# Patient Record
Sex: Male | Born: 1965
Health system: Southern US, Community
[De-identification: ages and names within clinical notes are randomized; demographics above are authoritative.]

## PROBLEM LIST (undated history)

## (undated) DIAGNOSIS — I314 Cardiac tamponade: Secondary | ICD-10-CM

## (undated) DIAGNOSIS — I10 Essential (primary) hypertension: Secondary | ICD-10-CM

## (undated) DIAGNOSIS — E785 Hyperlipidemia, unspecified: Secondary | ICD-10-CM

## (undated) DIAGNOSIS — G4730 Sleep apnea, unspecified: Secondary | ICD-10-CM

## (undated) DIAGNOSIS — I2699 Other pulmonary embolism without acute cor pulmonale: Secondary | ICD-10-CM

## (undated) DIAGNOSIS — I2510 Atherosclerotic heart disease of native coronary artery without angina pectoris: Secondary | ICD-10-CM

## (undated) DIAGNOSIS — I25118 Atherosclerotic heart disease of native coronary artery with other forms of angina pectoris: Secondary | ICD-10-CM

## (undated) DIAGNOSIS — Z8673 Personal history of transient ischemic attack (TIA), and cerebral infarction without residual deficits: Secondary | ICD-10-CM

## (undated) HISTORY — DX: Hyperlipidemia, unspecified: E78.5

## (undated) HISTORY — DX: Atherosclerotic heart disease of native coronary artery with other forms of angina pectoris: I25.118

## (undated) HISTORY — DX: Personal history of transient ischemic attack (TIA), and cerebral infarction without residual deficits: Z86.73

## (undated) HISTORY — DX: Essential (primary) hypertension: I10

## (undated) HISTORY — DX: Atherosclerotic heart disease of native coronary artery without angina pectoris: I25.10

## (undated) HISTORY — DX: Sleep apnea, unspecified: G47.30

## (undated) HISTORY — DX: Cardiac tamponade: I31.4

## (undated) HISTORY — PX: TONSILLECTOMY: SUR1361

---

## 2010-03-13 DIAGNOSIS — I2510 Atherosclerotic heart disease of native coronary artery without angina pectoris: Secondary | ICD-10-CM

## 2010-03-13 HISTORY — DX: Atherosclerotic heart disease of native coronary artery without angina pectoris: I25.10

## 2012-11-01 ENCOUNTER — Encounter: Payer: Self-pay | Admitting: Critical Care Medicine

## 2012-11-04 ENCOUNTER — Institutional Professional Consult (permissible substitution): Payer: Self-pay | Admitting: Critical Care Medicine

## 2012-11-07 ENCOUNTER — Institutional Professional Consult (permissible substitution): Payer: Self-pay | Admitting: Critical Care Medicine

## 2012-11-25 ENCOUNTER — Encounter: Payer: Self-pay | Admitting: Critical Care Medicine

## 2012-11-25 ENCOUNTER — Ambulatory Visit (INDEPENDENT_AMBULATORY_CARE_PROVIDER_SITE_OTHER): Payer: Medicaid Other | Admitting: Critical Care Medicine

## 2012-11-25 ENCOUNTER — Ambulatory Visit (HOSPITAL_BASED_OUTPATIENT_CLINIC_OR_DEPARTMENT_OTHER)
Admission: RE | Admit: 2012-11-25 | Discharge: 2012-11-25 | Disposition: A | Discharge status: Home / Self Care | Payer: Medicaid Other | Source: Ambulatory Visit | Attending: Critical Care Medicine | Admitting: Critical Care Medicine

## 2012-11-25 ENCOUNTER — Telehealth (HOSPITAL_COMMUNITY): Payer: Self-pay | Admitting: Respiratory Therapy

## 2012-11-25 VITALS — BP 152/98 | HR 62 | Ht 64.0 in | Wt 168.0 lb

## 2012-11-25 DIAGNOSIS — R918 Other nonspecific abnormal finding of lung field: Secondary | ICD-10-CM

## 2012-11-25 DIAGNOSIS — R0600 Dyspnea, unspecified: Secondary | ICD-10-CM

## 2012-11-25 DIAGNOSIS — I2510 Atherosclerotic heart disease of native coronary artery without angina pectoris: Secondary | ICD-10-CM | POA: Insufficient documentation

## 2012-11-25 DIAGNOSIS — F17200 Nicotine dependence, unspecified, uncomplicated: Secondary | ICD-10-CM

## 2012-11-25 DIAGNOSIS — R911 Solitary pulmonary nodule: Secondary | ICD-10-CM | POA: Insufficient documentation

## 2012-11-25 DIAGNOSIS — R0609 Other forms of dyspnea: Secondary | ICD-10-CM

## 2012-11-25 DIAGNOSIS — Z23 Encounter for immunization: Secondary | ICD-10-CM

## 2012-11-25 DIAGNOSIS — E785 Hyperlipidemia, unspecified: Secondary | ICD-10-CM | POA: Insufficient documentation

## 2012-11-25 DIAGNOSIS — I25118 Atherosclerotic heart disease of native coronary artery with other forms of angina pectoris: Secondary | ICD-10-CM | POA: Insufficient documentation

## 2012-11-25 DIAGNOSIS — Z8673 Personal history of transient ischemic attack (TIA), and cerebral infarction without residual deficits: Secondary | ICD-10-CM | POA: Insufficient documentation

## 2012-11-25 DIAGNOSIS — I10 Essential (primary) hypertension: Secondary | ICD-10-CM | POA: Insufficient documentation

## 2012-11-25 HISTORY — DX: Nicotine dependence, unspecified, uncomplicated: F17.200

## 2012-11-25 HISTORY — DX: Other nonspecific abnormal finding of lung field: R91.8

## 2012-11-25 MED ORDER — VARENICLINE TARTRATE 0.5 MG PO TABS: 0.5000 mg | ORAL_TABLET | Freq: Two times a day (BID) | ORAL | Status: DC

## 2012-11-25 NOTE — Telephone Encounter (Signed)
After further screening of this patient, he is not eligible based on exclusion 4.2- a prior CT scan available that previously identifies the same 8-30 mm lung nodule on the most current CT scan that is under consideration for study inclusion AND the prior CT scan more than 60 days prior to the current CT scan, irrespective of the size, density or appearance

## 2012-11-25 NOTE — Progress Notes (Signed)
Quick Note:  Notify the patient that the CT scan is stable. No changes in nodules. I recommend repeat scan in 12 months Focus on getting off cigarettes No change in medications are recommended. Continue current meds as prescribed at last office visit ______

## 2012-11-25 NOTE — Assessment & Plan Note (Signed)
9/15/2014CT Chest : small nodule RUL LUL. Stable since 03/2012 Smoking cessation advised and Rx chantix Repeat CT Chest in one year  Suspect benign nodules but need close f/u Plan Chantix smoking cessation counseling issued No other medication changes Note normal spirometry

## 2012-11-25 NOTE — Progress Notes (Signed)
Subjective:    Patient ID: Scott Foley, male    DOB: 12/08/1965, 46 y.o.   MRN: [id number]  HPI  47 y.o.M referred for pulm nodule eval. Pt had bronchitis end 09/2012. CT done 03/2012, found nodules then. Regular film done 09/2012.   Pt had cough, mucus was productive gray to brown.  No real wheeze. Noted ssome DOE.  Now is still prod cough but not as severe. When do is brown-gray.  Smoking 1PPD. Patches only used.    Past Medical History  Diagnosis Date  . CAD (coronary artery disease) 2012    [facility] No heart cath, treated medically [facility]  . Hypertension   . Hyperlipemia   . History of stroke      Family History  Problem Relation Age of Onset  . Hypertension Father   . Hypertension Sister   . Hypertension Brother   . Heart attack Father   . Heart attack Brother   . Stroke Father   . Prostate cancer Father   . Lung cancer Sister      History   Social History  . Marital Status: Married    Spouse Name: N/A    Number of Children: N/A  . Years of Education: N/A   Occupational History  . Not on file.   Social History Main Topics  . Smoking status: Current Every Day Smoker -- 1.00 packs/day for 32 years    Types: Cigarettes  . Smokeless tobacco: Not on file  . Alcohol Use: Not on file  . Drug Use: Not on file  . Sexual Activity: Not on file   Other Topics Concern  . Not on file   Social History Narrative  . No narrative on file     Allergies  Allergen Reactions  . Penicillins      Outpatient Prescriptions Prior to Visit  Medication Sig Dispense Refill  . clopidogrel (PLAVIX) 75 MG tablet Take 75 mg by mouth daily.      Marland Kitchen lisinopril (PRINIVIL,ZESTRIL) 40 MG tablet Take 40 mg by mouth daily.      . metoprolol (LOPRESSOR) 50 MG tablet Take 50 mg by mouth 2 (two) times daily.      . Omega-3 Fatty Acids (FISH OIL) 1000 MG CAPS Take by mouth 2 (two) times daily.      . pravastatin (PRAVACHOL) 40 MG tablet Take 40 mg by mouth daily.       No  facility-administered medications prior to visit.     Review of Systems  Constitutional: Negative for fever, chills, diaphoresis, activity change, appetite change, fatigue and unexpected weight change.  HENT: Positive for dental problem. Negative for hearing loss, ear pain, nosebleeds, congestion, sore throat, facial swelling, rhinorrhea, sneezing, mouth sores, trouble swallowing, neck pain, neck stiffness, voice change, postnasal drip, sinus pressure, tinnitus and ear discharge.   Eyes: Negative for photophobia, discharge, itching and visual disturbance.  Respiratory: Positive for cough (with production of gray mucus) and shortness of breath. Negative for apnea, choking, chest tightness, wheezing and stridor.   Cardiovascular: Negative for chest pain, palpitations and leg swelling.  Gastrointestinal: Negative for nausea, vomiting, abdominal pain, constipation, blood in stool and abdominal distention.  Genitourinary: Negative for dysuria, urgency, frequency, hematuria, flank pain, decreased urine volume and difficulty urinating.  Musculoskeletal: Negative for myalgias, back pain, joint swelling, arthralgias and gait problem.  Skin: Negative for color change, pallor and rash.  Neurological: Negative for dizziness, tremors, seizures, syncope, speech difficulty, weakness, light-headedness, numbness and headaches.  Hematological:  Negative for adenopathy. Does not bruise/bleed easily.  Psychiatric/Behavioral: Negative for confusion, sleep disturbance and agitation. The patient is not nervous/anxious.        Objective:   Physical Exam Filed Vitals:   11/25/12 1010  BP: 152/98  Pulse: 62  Height: 5\' 4"  (1.626 m)  Weight: 168 lb (76.204 kg)  SpO2: 96%    Gen: Pleasant, well-nourished, in no distress,  normal affect  ENT: No lesions,  mouth clear,  oropharynx clear, no postnasal drip  Neck: No JVD, no TMG, no carotid bruits  Lungs: No use of accessory muscles, no dullness to percussion,  clear without rales or rhonchi  Cardiovascular: RRR, heart sounds normal, no murmur or gallops, no peripheral edema  Abdomen: soft and NT, no HSM,  BS normal  Musculoskeletal: No deformities, no cyanosis or clubbing  Neuro: alert, non focal  Skin: Warm, no lesions or rashes  Ct Chest Wo Contrast  11/25/2012   CLINICAL DATA:  Follow-up pulmonary nodules  EXAM: CT CHEST WITHOUT CONTRAST  TECHNIQUE: Multidetector CT imaging of the chest was performed following the standard protocol without IV contrast.  COMPARISON:  [facility] CT chest dated 03/30/2012  FINDINGS: Scattered bilateral pulmonary nodules, including:  --4 mm subpleural nodule in the left upper lobe (series 3/image 19)  --8 x 6 mm subpleural nodule along the right major fissure (series 3/image 30)  --Additional tiny fissural nodularity bilaterally (series 3/ images 32 and 34)  --3 mm subpleural nodule in the right middle lobe (series 3/image 36)  No new/suspicious pulmonary nodules. No pleural effusion or pneumothorax.  Visualized thyroid is unremarkable.  Heart is normal in size. No pericardial effusion. Coronary atherosclerosis (series 2/ image 31).  Small mediastinal lymph nodes measuring up to 7 mm short axis, likely reactive. No suspicious axillary lymphadenopathy.  Visualized upper abdomen is notable for vascular calcifications and a calcified splenic granuloma.  Visualized osseous structures are within normal limits.  IMPRESSION: Scattered small bilateral pulmonary nodules, as described above, including a stable 8 x 6 mm subpleural nodule along the right minor fissure.  8 month stability has been demonstrated. Given high risk for primary bronchogenic neoplasm, follow-up CT chest is suggested in approximately 12 months (18-24 months from initial CT). This recommendation follows the consensus statement: Guidelines for Management of Small Pulmonary Nodules Detected on CT Scans: A Statement from the Fleischner Society as published in Radiology  2005; 237:395-400.  Age advanced coronary atherosclerosis.   Electronically Signed   By: Charline Bills M.D.   On: 11/25/2012 14:19          Assessment & Plan:   Pulmonary nodules/lesions, multiple 9/15/2014CT Chest : small nodule RUL LUL. Stable since 03/2012 Smoking cessation advised and Rx chantix Repeat CT Chest in one year  Suspect benign nodules but need close f/u Plan Chantix smoking cessation counseling issued No other medication changes Note normal spirometry  Tobacco use disorder >24min smoking cessation counseling given chantix rx given    Updated Medication List Outpatient Encounter Prescriptions as of 11/25/2012  Medication Sig Dispense Refill  . clopidogrel (PLAVIX) 75 MG tablet Take 75 mg by mouth daily.      Marland Kitchen lisinopril (PRINIVIL,ZESTRIL) 40 MG tablet Take 40 mg by mouth daily.      . metoprolol (LOPRESSOR) 50 MG tablet Take 50 mg by mouth 2 (two) times daily.      . Omega-3 Fatty Acids (FISH OIL) 1000 MG CAPS Take by mouth 2 (two) times daily.      Marland Kitchen  pravastatin (PRAVACHOL) 40 MG tablet Take 40 mg by mouth daily.      . varenicline (CHANTIX) 0.5 MG tablet Take 1 tablet (0.5 mg total) by mouth 2 (two) times daily.  60 tablet  2   No facility-administered encounter medications on file as of 11/25/2012.

## 2012-11-25 NOTE — Telephone Encounter (Signed)
Ok thank you 

## 2012-11-25 NOTE — Patient Instructions (Addendum)
Consider flu vaccine and pneumovax today Stop smoking, I recommend Chantix A CT chest will be obtained, I will call results Return 3 months

## 2012-11-25 NOTE — Assessment & Plan Note (Signed)
>  smoking cessation counseling given chantix rx given

## 2012-11-26 NOTE — Progress Notes (Signed)
Quick Note:  Spoke with pt. Informed him of ct chest results and recs per Dr. Delford Field. He verbalized understanding and voiced no further questions or concerns at this time. ______

## 2012-11-26 NOTE — Progress Notes (Signed)
Quick Note:  lmomtcb for pt ______ 

## 2012-12-02 ENCOUNTER — Telehealth: Payer: Self-pay | Admitting: Critical Care Medicine

## 2012-12-02 NOTE — Telephone Encounter (Signed)
Spoke with the pt and notified that he does not qualify for the study per PW He verbalized understanding and states nothing further needed

## 2012-12-02 NOTE — Telephone Encounter (Signed)
Yes, he does not qualify for the study

## 2012-12-02 NOTE — Telephone Encounter (Signed)
I spoke with pt. He stated he was advised that his CT was going to be showed to another physician by Dr. Delford Field and see if he qualifies for a study. I do not see this in EPIC. Please advise Dr. Delford Field thanks

## 2013-06-17 ENCOUNTER — Telehealth: Payer: Self-pay | Admitting: Critical Care Medicine

## 2013-06-17 NOTE — Telephone Encounter (Signed)
Pt saw PW in [place] 11/25/12.  Pt lives in [place] & would like to be seen in [place] office.  Set for ROV on 4/14 @ 12.  Pt given address, verbalized understanding & states nothing further needed at this time.

## 2013-06-24 ENCOUNTER — Encounter: Payer: Self-pay | Admitting: Critical Care Medicine

## 2013-06-24 ENCOUNTER — Ambulatory Visit (INDEPENDENT_AMBULATORY_CARE_PROVIDER_SITE_OTHER): Payer: Self-pay | Admitting: Critical Care Medicine

## 2013-06-24 VITALS — BP 156/78 | HR 82 | Temp 97.9°F | Ht 63.0 in | Wt 176.6 lb

## 2013-06-24 DIAGNOSIS — R918 Other nonspecific abnormal finding of lung field: Secondary | ICD-10-CM

## 2013-06-24 DIAGNOSIS — F17200 Nicotine dependence, unspecified, uncomplicated: Secondary | ICD-10-CM

## 2013-06-24 MED ORDER — NICOTINE 10 MG IN INHA: RESPIRATORY_TRACT | Status: DC

## 2013-06-24 NOTE — Patient Instructions (Signed)
Start nicotrol 60-80 puff per cartridges 6 cartridges per day A CT Chest will be obtained Return 3 months

## 2013-06-24 NOTE — Assessment & Plan Note (Addendum)
Bilateral pulmonary nodules Ongoing tobacco use Plan  Repeat CT Chest

## 2013-06-24 NOTE — Progress Notes (Signed)
Subjective:    Patient ID: Scott Foley, male    DOB: Sep 23, 1965, 48 y.o.   MRN: [id number]  HPI   48 y.o.M referred for pulm nodule eval. Pt had bronchitis end 09/2012. CT done 03/2012, found nodules then. Regular film done 09/2012.   Pt had cough, mucus was productive gray to brown.  No real wheeze. Noted ssome DOE.  Now is still prod cough but not as severe. When do is brown-gray.  Smoking 1PPD. Patches only used.    06/24/2013 Chief Complaint  Patient presents with  . 6 month follow up    Breathing has worsened over the past month.  Has increased DOE and chest pressure with activity.  Not coughing much - prod at times with gray mucus.  No f/c/s.  Was in [place] ED on April 5 - cxr done.    Not seen since 11/2012.  Smoker. Pt just in ED dyspnea, cough, gray mucus. Notes no wheezing.  Chest tight if move around. No real nasal drainage.  ?early PNA.  Pt smokes 10cigs/day.    Past Medical History  Diagnosis Date  . CAD (coronary artery disease) 2012    [facility] No heart cath, treated medically [facility]  . Hypertension   . Hyperlipemia   . History of stroke      Family History  Problem Relation Age of Onset  . Hypertension Father   . Hypertension Sister   . Hypertension Brother   . Heart attack Father   . Heart attack Brother   . Stroke Father   . Prostate cancer Father   . Lung cancer Sister      History   Social History  . Marital Status: Married    Spouse Name: N/A    Number of Children: N/A  . Years of Education: N/A   Occupational History  . Not on file.   Social History Main Topics  . Smoking status: Current Every Day Smoker -- 0.50 packs/day    Types: Cigarettes    Start date: 03/14/1979  . Smokeless tobacco: Never Used  . Alcohol Use: No  . Drug Use: No  . Sexual Activity: Not on file   Other Topics Concern  . Not on file   Social History Narrative  . No narrative on file     Allergies  Allergen Reactions  . Penicillins      Outpatient  Prescriptions Prior to Visit  Medication Sig Dispense Refill  . clopidogrel (PLAVIX) 75 MG tablet Take 75 mg by mouth daily.      Marland Kitchen lisinopril (PRINIVIL,ZESTRIL) 40 MG tablet Take 40 mg by mouth daily.      . metoprolol (LOPRESSOR) 50 MG tablet Take 50 mg by mouth 2 (two) times daily.      . Omega-3 Fatty Acids (FISH OIL) 1000 MG CAPS Take by mouth 2 (two) times daily.      . pravastatin (PRAVACHOL) 40 MG tablet Take 40 mg by mouth daily.      . varenicline (CHANTIX) 0.5 MG tablet Take 1 tablet (0.5 mg total) by mouth 2 (two) times daily.  60 tablet  2   No facility-administered medications prior to visit.     Review of Systems  Constitutional: Negative for fever, chills, diaphoresis, activity change, appetite change, fatigue and unexpected weight change.  HENT: Positive for dental problem. Negative for congestion, ear discharge, ear pain, facial swelling, hearing loss, mouth sores, nosebleeds, postnasal drip, rhinorrhea, sinus pressure, sneezing, sore throat, tinnitus, trouble swallowing and voice  change.   Eyes: Negative for photophobia, discharge, itching and visual disturbance.  Respiratory: Positive for cough (with production of gray mucus) and shortness of breath. Negative for apnea, choking, chest tightness, wheezing and stridor.   Cardiovascular: Negative for chest pain, palpitations and leg swelling.  Gastrointestinal: Negative for nausea, vomiting, abdominal pain, constipation, blood in stool and abdominal distention.  Genitourinary: Negative for dysuria, urgency, frequency, hematuria, flank pain, decreased urine volume and difficulty urinating.  Musculoskeletal: Negative for arthralgias, back pain, gait problem, joint swelling, myalgias, neck pain and neck stiffness.  Skin: Negative for color change, pallor and rash.  Neurological: Negative for dizziness, tremors, seizures, syncope, speech difficulty, weakness, light-headedness, numbness and headaches.  Hematological: Negative for  adenopathy. Does not bruise/bleed easily.  Psychiatric/Behavioral: Negative for confusion, sleep disturbance and agitation. The patient is not nervous/anxious.        Objective:   Physical Exam  Filed Vitals:   06/24/13 1150  BP: 156/78  Pulse: 82  Temp: 97.9 F (36.6 C)  TempSrc: Oral  Height: 5\' 3"  (1.6 m)  Weight: 176 lb 9.6 oz (80.105 kg)  SpO2: 99%    Gen: Pleasant, well-nourished, in no distress,  normal affect  ENT: No lesions,  mouth clear,  oropharynx clear, no postnasal drip  Neck: No JVD, no TMG, no carotid bruits  Lungs: No use of accessory muscles, no dullness to percussion, clear without rales or rhonchi  Cardiovascular: RRR, heart sounds normal, no murmur or gallops, no peripheral edema  Abdomen: soft and NT, no HSM,  BS normal  Musculoskeletal: No deformities, no cyanosis or clubbing  Neuro: alert, non focal  Skin: Warm, no lesions or rashes  No results found.        Assessment & Plan:   Pulmonary nodules/lesions, multiple Bilateral pulmonary nodules Ongoing tobacco use Plan  Repeat CT Chest   Tobacco use disorder Active smoking use.   Smoking cessation counseling issued   Mild bronchitis now resolving No further ABX   Updated Medication List Outpatient Encounter Prescriptions as of 06/24/2013  Medication Sig  . clopidogrel (PLAVIX) 75 MG tablet Take 75 mg by mouth daily.  Marland Kitchen lisinopril (PRINIVIL,ZESTRIL) 40 MG tablet Take 40 mg by mouth daily.  . metoprolol (LOPRESSOR) 50 MG tablet Take 50 mg by mouth 2 (two) times daily.  . Omega-3 Fatty Acids (FISH OIL) 1000 MG CAPS Take by mouth 2 (two) times daily.  . pravastatin (PRAVACHOL) 40 MG tablet Take 40 mg by mouth daily.  . nicotine (NICOTROL) 10 MG inhaler Take 60- 80 puff per cartridge 6 cartridges per day  . [DISCONTINUED] varenicline (CHANTIX) 0.5 MG tablet Take 1 tablet (0.5 mg total) by mouth 2 (two) times daily.

## 2013-06-26 NOTE — Assessment & Plan Note (Signed)
Active smoking use.   Smoking cessation counseling issued

## 2013-06-27 ENCOUNTER — Telehealth: Payer: Self-pay | Admitting: Critical Care Medicine

## 2013-06-27 NOTE — Telephone Encounter (Signed)
Spoke to lisa she is to call the [place] office for the pcc there to have done this precert for is pt Scott Foley

## 2013-06-27 NOTE — Telephone Encounter (Signed)
Spoke with Lattie Haw at [facility].  They need precert for pt. Ct is scheduled for 06/30/13. - Insurance is [employer] - CPT code : [phone]

## 2013-07-01 ENCOUNTER — Telehealth: Payer: Self-pay | Admitting: Critical Care Medicine

## 2013-07-01 DIAGNOSIS — R918 Other nonspecific abnormal finding of lung field: Secondary | ICD-10-CM

## 2013-07-01 NOTE — Telephone Encounter (Signed)
Pt aware of results of ct  Repeat one year

## 2013-07-10 ENCOUNTER — Ambulatory Visit: Payer: Medicaid Other | Admitting: Critical Care Medicine

## 2013-08-07 ENCOUNTER — Encounter: Payer: Self-pay | Admitting: Critical Care Medicine

## 2013-11-19 ENCOUNTER — Telehealth: Payer: Self-pay | Admitting: *Deleted

## 2013-11-19 NOTE — Telephone Encounter (Signed)
lmomtcb on pt's home and cell #s - would he like to have CT done at [facility]?

## 2013-11-19 NOTE — Telephone Encounter (Signed)
Message copied by Adalberto Ill on Wed Nov 19, 2013 12:08 PM ------      Message from: Elsie Stain      Created: Tue Nov 18, 2013 12:57 PM      Regarding: ct chest       Time to schedule a CT chest NON contrast      F/u on lung nodules            pw      ----- Message -----         From: Elsie Stain, MD         Sent: 11/25/2012   5:41 PM           To: Elsie Stain, MD            chk CT chest nodules        ------

## 2013-11-20 NOTE — Telephone Encounter (Signed)
lmomtcb  

## 2013-11-24 NOTE — Telephone Encounter (Signed)
lmomtcb for pt 

## 2013-11-27 NOTE — Telephone Encounter (Signed)
Upon review of pt's chart,  Pt has had a repeat CT Chest on 07/01/13 at [facility] - results scanned into chart. Msg from 07/01/13 with the following recs regarding CT Chest f/u:  Elsie Stain, MD at 07/01/2013 10:51 AM     Status: Signed        Pt aware of results of ct  Repeat one year   ---------  Spoke with Dr. Joya Gaskins.  Pt does not need a repeat CT Chest at this time.  He will be due for a repeat CT Chest in April 2016.  Reminder placed for April 2016 f/u CT Chest.

## 2014-07-01 ENCOUNTER — Telehealth: Payer: Self-pay | Admitting: Critical Care Medicine

## 2014-07-01 NOTE — Telephone Encounter (Signed)
Pt needs yearly f/u CT Chest non contrast to f/u on lung nodules.  lmomtcb for pt on home #. ATC pt's cell # - rang several times then received msg that VM box is not set up.  WCB. Would pt like to have scan done in [facility]?

## 2014-07-01 NOTE — Telephone Encounter (Signed)
Yes Cantril is ok

## 2014-07-03 NOTE — Telephone Encounter (Signed)
Called pt at [phone] (# listed as pt's home #), lmomtcb  ATC pt at [phone] (# listed as pt's cell #), was advised gentleman has only had this # for a short period of time and is not the correct contact # for this pt.

## 2014-07-06 NOTE — Telephone Encounter (Signed)
Spoke with pt and he states he needs to wait on having CT done until he gets his insurance straightened out.  He states he will call out office when he is ready to schedule.

## 2014-07-06 NOTE — Telephone Encounter (Signed)
Pt returning call.Scott Foley ° °

## 2014-07-06 NOTE — Telephone Encounter (Signed)
Noted, crystal put on call back list to chk on this pt status in two months

## 2014-07-06 NOTE — Telephone Encounter (Signed)
ATC pt at [phone], rang several times then received msg that memory is full.  Unable to leave msg.   ATC emergency contact, pt's brother Shanon Brow at [phone], lmomtcb Will attempt to call [place] office to see if they have a alternative contact # for pt.

## 2014-07-07 NOTE — Telephone Encounter (Signed)
Reminder placed to f/u with pt in 2 months re: CT Chest.

## 2014-09-02 ENCOUNTER — Telehealth: Payer: Self-pay | Admitting: *Deleted

## 2014-09-02 DIAGNOSIS — R918 Other nonspecific abnormal finding of lung field: Secondary | ICD-10-CM

## 2014-09-02 NOTE — Telephone Encounter (Signed)
Order has been placed.

## 2014-09-02 NOTE — Telephone Encounter (Signed)
-----   Message from Elsie Stain, MD sent at 09/01/2014  9:30 AM EDT ----- Regarding: FW: needs ct chst  no contrast lung nodule Needs repeat ct chest done  ----- Message -----    From: Adalberto Ill, RN    Sent: 07/07/2014   8:18 AM      To: Elsie Stain, MD Subject: FW: needs ct chst  no contrast lung nodule     F/u with pt to schedule CT - see April 2016 phone msg ( pt needed to wait to schedule scan)   ----- Message -----    From: Elsie Stain, MD    Sent: 06/29/2014   7:50 PM      To: Adalberto Ill, RN Subject: needs ct chst  no contrast lung nodule         fyi ----- Message -----    From: Adalberto Ill, RN    Sent: 11/27/2013   2:49 PM      To: Elsie Stain, MD Subject: RE: ct chest                                   Pt had a repeat CT Chest April 2015 with recs to repeat in 1 yr.   Will need f/u CT Chest NON contrast April 2016 F/u on lung nodules   ----- Message -----    From: Elsie Stain, MD    Sent: 11/18/2013  12:57 PM      To: Jonelle Sports, RN Subject: ct chest                                       Time to schedule a CT chest NON contrast F/u on lung nodules  pw ----- Message -----    From: Elsie Stain, MD    Sent: 11/25/2012   5:41 PM      To: Elsie Stain, MD  chk CT chest nodules

## 2014-10-15 ENCOUNTER — Telehealth: Payer: Self-pay | Admitting: Critical Care Medicine

## 2014-10-15 ENCOUNTER — Encounter: Payer: Self-pay | Admitting: *Deleted

## 2014-10-15 NOTE — Telephone Encounter (Signed)
Pt is due for CT Chest to f/u pulmonary nodules.  PCCs have been trying to contact pt to schedule this for several wks.  However, the # listed as pt's home #, [phone], is no longer in service.  [place], [facility] attempted to contact pt's emergency contact who is listed as his brother, Shanon Brow, at [phone] a few days ago.  [place] states she spoke with Shanon Brow who stated he would let pt know our office was trying to contact him.  We have not received call back from pt.  I called and spoke with pt's brother, Shanon Brow, at his # listed above and advised we are trying to contact pt but # we have is no longer in service.  Shanon Brow states he will let pt know tonight that we are trying to contact him and ask for him to call us back.  I provided Shanon Brow with our #.  Asked Shanon Brow if he was able to provide a correct contact # for pt - stated he did not know the #.  Asked if he knew pt's correct mailing address, but he did not know this either.  As we have left msg for pt x 2 with his emergency contact and do not have a working # for pt, will send certified letter to pt's address on file.    Letter completed.  Will take to Mail Room to send certified tomorrow as she has already left today.

## 2014-10-16 NOTE — Telephone Encounter (Signed)
Thank you for your due diligence in this matter  Also contact the pt PCP to make him aware

## 2014-10-16 NOTE — Telephone Encounter (Signed)
Called Dr. Marjie Skiff with [facility]. Advised her of below and she will will Dr. Megan Salon know.

## 2014-10-16 NOTE — Telephone Encounter (Signed)
Letter given to Terri in Mail Room to send certified to pt.  Will sign off and route msg to PW as FYI.

## 2014-10-23 ENCOUNTER — Telehealth: Payer: Self-pay | Admitting: Critical Care Medicine

## 2014-10-23 NOTE — Telephone Encounter (Signed)
Will send to [facility] as she was trying to get in touch with this patient regarding a referral .  Referral Notes     Type Date User   General 10/12/2014 12:48 PM Brien Few E        Note   Reached this pt's brother Shanon Brow) who will have pt call our office to scheduled this appt Joellen Jersey

## 2014-10-23 NOTE — Telephone Encounter (Signed)
Pt scheduled@[facility] 10[facility]30/14@10 :00am for his chest ct and he is aware Joellen Jersey

## 2014-11-02 ENCOUNTER — Telehealth: Payer: Self-pay | Admitting: Critical Care Medicine

## 2014-11-02 DIAGNOSIS — R918 Other nonspecific abnormal finding of lung field: Secondary | ICD-10-CM

## 2014-11-02 NOTE — Telephone Encounter (Signed)
Pt had CT scan done at [facility] 10/30/14 and is requesting results. Please advise Dr. Joya Gaskins thanks

## 2014-11-02 NOTE — Telephone Encounter (Signed)
i spoke to the pt and he is aware of results

## 2014-11-02 NOTE — Telephone Encounter (Signed)
I called pt and Ct chest is stable.  No new issues.   No new scans needed  Pt advised to pursue smoking cessation

## 2014-11-02 NOTE — Telephone Encounter (Signed)
Results given to Dr. Joya Gaskins to review.

## 2014-11-02 NOTE — Telephone Encounter (Signed)
Let patient know results ,  Let me know results

## 2015-01-11 ENCOUNTER — Encounter: Payer: Self-pay | Admitting: Critical Care Medicine

## 2016-05-11 DIAGNOSIS — I2510 Atherosclerotic heart disease of native coronary artery without angina pectoris: Secondary | ICD-10-CM | POA: Diagnosis not present

## 2016-05-11 DIAGNOSIS — I252 Old myocardial infarction: Secondary | ICD-10-CM | POA: Diagnosis not present

## 2016-05-11 DIAGNOSIS — R51 Headache: Secondary | ICD-10-CM | POA: Diagnosis not present

## 2016-05-11 DIAGNOSIS — I160 Hypertensive urgency: Secondary | ICD-10-CM | POA: Diagnosis not present

## 2016-05-15 DIAGNOSIS — Z8673 Personal history of transient ischemic attack (TIA), and cerebral infarction without residual deficits: Secondary | ICD-10-CM | POA: Diagnosis not present

## 2016-05-15 DIAGNOSIS — I160 Hypertensive urgency: Secondary | ICD-10-CM | POA: Diagnosis not present

## 2016-05-15 DIAGNOSIS — I2699 Other pulmonary embolism without acute cor pulmonale: Secondary | ICD-10-CM | POA: Diagnosis not present

## 2016-05-15 DIAGNOSIS — R0602 Shortness of breath: Secondary | ICD-10-CM | POA: Diagnosis not present

## 2016-05-15 DIAGNOSIS — Z720 Tobacco use: Secondary | ICD-10-CM | POA: Diagnosis not present

## 2016-05-15 DIAGNOSIS — R69 Illness, unspecified: Secondary | ICD-10-CM | POA: Diagnosis not present

## 2016-05-15 DIAGNOSIS — J449 Chronic obstructive pulmonary disease, unspecified: Secondary | ICD-10-CM | POA: Diagnosis not present

## 2016-05-15 DIAGNOSIS — Z79899 Other long term (current) drug therapy: Secondary | ICD-10-CM | POA: Diagnosis not present

## 2016-05-15 DIAGNOSIS — F17210 Nicotine dependence, cigarettes, uncomplicated: Secondary | ICD-10-CM | POA: Diagnosis not present

## 2016-05-15 DIAGNOSIS — Z596 Low income: Secondary | ICD-10-CM | POA: Diagnosis not present

## 2016-05-15 DIAGNOSIS — I10 Essential (primary) hypertension: Secondary | ICD-10-CM | POA: Diagnosis not present

## 2016-05-15 DIAGNOSIS — I2510 Atherosclerotic heart disease of native coronary artery without angina pectoris: Secondary | ICD-10-CM | POA: Diagnosis not present

## 2016-05-15 DIAGNOSIS — I252 Old myocardial infarction: Secondary | ICD-10-CM | POA: Diagnosis not present

## 2016-05-15 DIAGNOSIS — R079 Chest pain, unspecified: Secondary | ICD-10-CM | POA: Diagnosis not present

## 2016-05-16 DIAGNOSIS — I10 Essential (primary) hypertension: Secondary | ICD-10-CM | POA: Diagnosis not present

## 2016-05-16 DIAGNOSIS — Z8673 Personal history of transient ischemic attack (TIA), and cerebral infarction without residual deficits: Secondary | ICD-10-CM | POA: Diagnosis not present

## 2016-05-16 DIAGNOSIS — Z720 Tobacco use: Secondary | ICD-10-CM | POA: Diagnosis not present

## 2016-05-16 DIAGNOSIS — I2699 Other pulmonary embolism without acute cor pulmonale: Secondary | ICD-10-CM | POA: Diagnosis not present

## 2016-05-18 DIAGNOSIS — I2699 Other pulmonary embolism without acute cor pulmonale: Secondary | ICD-10-CM | POA: Diagnosis not present

## 2016-05-22 DIAGNOSIS — Z7901 Long term (current) use of anticoagulants: Secondary | ICD-10-CM | POA: Diagnosis not present

## 2016-05-24 DIAGNOSIS — I2699 Other pulmonary embolism without acute cor pulmonale: Secondary | ICD-10-CM | POA: Diagnosis not present

## 2016-05-24 DIAGNOSIS — I10 Essential (primary) hypertension: Secondary | ICD-10-CM | POA: Diagnosis not present

## 2016-05-24 DIAGNOSIS — R69 Illness, unspecified: Secondary | ICD-10-CM | POA: Diagnosis not present

## 2016-06-21 DIAGNOSIS — I10 Essential (primary) hypertension: Secondary | ICD-10-CM | POA: Diagnosis not present

## 2016-06-21 DIAGNOSIS — Z7901 Long term (current) use of anticoagulants: Secondary | ICD-10-CM | POA: Diagnosis not present

## 2016-06-22 DIAGNOSIS — I2699 Other pulmonary embolism without acute cor pulmonale: Secondary | ICD-10-CM | POA: Diagnosis not present

## 2016-09-08 DIAGNOSIS — S30861A Insect bite (nonvenomous) of abdominal wall, initial encounter: Secondary | ICD-10-CM | POA: Diagnosis not present

## 2016-09-08 DIAGNOSIS — W57XXXA Bitten or stung by nonvenomous insect and other nonvenomous arthropods, initial encounter: Secondary | ICD-10-CM | POA: Diagnosis not present

## 2016-09-08 DIAGNOSIS — R5383 Other fatigue: Secondary | ICD-10-CM | POA: Diagnosis not present

## 2016-09-09 DIAGNOSIS — W57XXXA Bitten or stung by nonvenomous insect and other nonvenomous arthropods, initial encounter: Secondary | ICD-10-CM | POA: Diagnosis not present

## 2016-09-09 DIAGNOSIS — R5383 Other fatigue: Secondary | ICD-10-CM | POA: Diagnosis not present

## 2016-09-09 DIAGNOSIS — S30861A Insect bite (nonvenomous) of abdominal wall, initial encounter: Secondary | ICD-10-CM | POA: Diagnosis not present

## 2016-09-19 DIAGNOSIS — I10 Essential (primary) hypertension: Secondary | ICD-10-CM | POA: Diagnosis not present

## 2016-09-19 DIAGNOSIS — A770 Spotted fever due to Rickettsia rickettsii: Secondary | ICD-10-CM | POA: Diagnosis not present

## 2016-09-19 DIAGNOSIS — Z7901 Long term (current) use of anticoagulants: Secondary | ICD-10-CM | POA: Diagnosis not present

## 2016-09-19 DIAGNOSIS — E782 Mixed hyperlipidemia: Secondary | ICD-10-CM | POA: Diagnosis not present

## 2016-09-28 DIAGNOSIS — Z7901 Long term (current) use of anticoagulants: Secondary | ICD-10-CM | POA: Diagnosis not present

## 2016-12-14 DIAGNOSIS — Z0000 Encounter for general adult medical examination without abnormal findings: Secondary | ICD-10-CM | POA: Diagnosis not present

## 2016-12-14 DIAGNOSIS — N400 Enlarged prostate without lower urinary tract symptoms: Secondary | ICD-10-CM | POA: Diagnosis not present

## 2016-12-14 DIAGNOSIS — Z6828 Body mass index (BMI) 28.0-28.9, adult: Secondary | ICD-10-CM | POA: Diagnosis not present

## 2016-12-14 DIAGNOSIS — Z23 Encounter for immunization: Secondary | ICD-10-CM | POA: Diagnosis not present

## 2017-02-13 DIAGNOSIS — I2510 Atherosclerotic heart disease of native coronary artery without angina pectoris: Secondary | ICD-10-CM | POA: Diagnosis not present

## 2017-02-13 DIAGNOSIS — Z7901 Long term (current) use of anticoagulants: Secondary | ICD-10-CM | POA: Diagnosis not present

## 2017-02-13 DIAGNOSIS — I2699 Other pulmonary embolism without acute cor pulmonale: Secondary | ICD-10-CM | POA: Diagnosis not present

## 2017-02-13 DIAGNOSIS — I10 Essential (primary) hypertension: Secondary | ICD-10-CM | POA: Diagnosis not present

## 2017-02-13 DIAGNOSIS — E782 Mixed hyperlipidemia: Secondary | ICD-10-CM | POA: Diagnosis not present

## 2017-02-21 DIAGNOSIS — Z7901 Long term (current) use of anticoagulants: Secondary | ICD-10-CM | POA: Diagnosis not present

## 2017-02-27 DIAGNOSIS — H5203 Hypermetropia, bilateral: Secondary | ICD-10-CM | POA: Diagnosis not present

## 2017-03-09 DIAGNOSIS — H524 Presbyopia: Secondary | ICD-10-CM | POA: Diagnosis not present

## 2017-03-09 DIAGNOSIS — H5203 Hypermetropia, bilateral: Secondary | ICD-10-CM | POA: Diagnosis not present

## 2017-03-09 DIAGNOSIS — H52209 Unspecified astigmatism, unspecified eye: Secondary | ICD-10-CM | POA: Diagnosis not present

## 2017-03-20 DIAGNOSIS — I10 Essential (primary) hypertension: Secondary | ICD-10-CM | POA: Diagnosis not present

## 2017-03-20 DIAGNOSIS — Z7901 Long term (current) use of anticoagulants: Secondary | ICD-10-CM | POA: Diagnosis not present

## 2017-04-17 DIAGNOSIS — I10 Essential (primary) hypertension: Secondary | ICD-10-CM | POA: Diagnosis not present

## 2017-06-07 DIAGNOSIS — E782 Mixed hyperlipidemia: Secondary | ICD-10-CM | POA: Diagnosis not present

## 2017-06-07 DIAGNOSIS — I2699 Other pulmonary embolism without acute cor pulmonale: Secondary | ICD-10-CM | POA: Diagnosis not present

## 2017-06-07 DIAGNOSIS — I10 Essential (primary) hypertension: Secondary | ICD-10-CM | POA: Diagnosis not present

## 2017-06-07 DIAGNOSIS — Z122 Encounter for screening for malignant neoplasm of respiratory organs: Secondary | ICD-10-CM | POA: Diagnosis not present

## 2017-06-12 DIAGNOSIS — X58XXXA Exposure to other specified factors, initial encounter: Secondary | ICD-10-CM | POA: Diagnosis not present

## 2017-06-12 DIAGNOSIS — S93402A Sprain of unspecified ligament of left ankle, initial encounter: Secondary | ICD-10-CM | POA: Diagnosis not present

## 2017-06-12 DIAGNOSIS — S86912A Strain of unspecified muscle(s) and tendon(s) at lower leg level, left leg, initial encounter: Secondary | ICD-10-CM | POA: Diagnosis not present

## 2017-06-12 DIAGNOSIS — M79605 Pain in left leg: Secondary | ICD-10-CM | POA: Diagnosis not present

## 2017-06-28 DIAGNOSIS — Z87891 Personal history of nicotine dependence: Secondary | ICD-10-CM | POA: Diagnosis not present

## 2017-06-28 DIAGNOSIS — I2510 Atherosclerotic heart disease of native coronary artery without angina pectoris: Secondary | ICD-10-CM | POA: Diagnosis not present

## 2017-06-28 DIAGNOSIS — Z122 Encounter for screening for malignant neoplasm of respiratory organs: Secondary | ICD-10-CM | POA: Diagnosis not present

## 2017-06-28 DIAGNOSIS — I700 Atherosclerosis of aorta: Secondary | ICD-10-CM | POA: Diagnosis not present

## 2017-06-28 DIAGNOSIS — J439 Emphysema, unspecified: Secondary | ICD-10-CM | POA: Diagnosis not present

## 2017-06-28 DIAGNOSIS — R69 Illness, unspecified: Secondary | ICD-10-CM | POA: Diagnosis not present

## 2017-09-11 DIAGNOSIS — I2699 Other pulmonary embolism without acute cor pulmonale: Secondary | ICD-10-CM | POA: Diagnosis not present

## 2017-09-11 DIAGNOSIS — I10 Essential (primary) hypertension: Secondary | ICD-10-CM | POA: Diagnosis not present

## 2017-09-11 DIAGNOSIS — E782 Mixed hyperlipidemia: Secondary | ICD-10-CM | POA: Diagnosis not present

## 2017-09-11 DIAGNOSIS — I69314 Frontal lobe and executive function deficit following cerebral infarction: Secondary | ICD-10-CM | POA: Diagnosis not present

## 2017-09-12 DIAGNOSIS — I10 Essential (primary) hypertension: Secondary | ICD-10-CM | POA: Diagnosis not present

## 2017-11-07 DIAGNOSIS — I2510 Atherosclerotic heart disease of native coronary artery without angina pectoris: Secondary | ICD-10-CM | POA: Diagnosis not present

## 2017-11-07 DIAGNOSIS — J449 Chronic obstructive pulmonary disease, unspecified: Secondary | ICD-10-CM | POA: Diagnosis not present

## 2017-11-07 DIAGNOSIS — R42 Dizziness and giddiness: Secondary | ICD-10-CM | POA: Diagnosis not present

## 2017-11-07 DIAGNOSIS — I160 Hypertensive urgency: Secondary | ICD-10-CM | POA: Diagnosis not present

## 2017-11-07 DIAGNOSIS — J439 Emphysema, unspecified: Secondary | ICD-10-CM | POA: Diagnosis not present

## 2017-11-07 DIAGNOSIS — I252 Old myocardial infarction: Secondary | ICD-10-CM | POA: Diagnosis not present

## 2017-11-07 DIAGNOSIS — Z8673 Personal history of transient ischemic attack (TIA), and cerebral infarction without residual deficits: Secondary | ICD-10-CM | POA: Diagnosis not present

## 2017-11-07 DIAGNOSIS — I10 Essential (primary) hypertension: Secondary | ICD-10-CM | POA: Diagnosis not present

## 2017-11-13 DIAGNOSIS — Z6829 Body mass index (BMI) 29.0-29.9, adult: Secondary | ICD-10-CM | POA: Diagnosis not present

## 2017-11-13 DIAGNOSIS — I160 Hypertensive urgency: Secondary | ICD-10-CM | POA: Diagnosis not present

## 2017-12-14 DIAGNOSIS — R69 Illness, unspecified: Secondary | ICD-10-CM | POA: Diagnosis not present

## 2017-12-14 DIAGNOSIS — Z125 Encounter for screening for malignant neoplasm of prostate: Secondary | ICD-10-CM | POA: Diagnosis not present

## 2017-12-14 DIAGNOSIS — Z23 Encounter for immunization: Secondary | ICD-10-CM | POA: Diagnosis not present

## 2017-12-14 DIAGNOSIS — E782 Mixed hyperlipidemia: Secondary | ICD-10-CM | POA: Diagnosis not present

## 2017-12-14 DIAGNOSIS — I69314 Frontal lobe and executive function deficit following cerebral infarction: Secondary | ICD-10-CM | POA: Diagnosis not present

## 2017-12-14 DIAGNOSIS — I10 Essential (primary) hypertension: Secondary | ICD-10-CM | POA: Diagnosis not present

## 2017-12-17 DIAGNOSIS — Z0000 Encounter for general adult medical examination without abnormal findings: Secondary | ICD-10-CM | POA: Diagnosis not present

## 2017-12-17 DIAGNOSIS — E663 Overweight: Secondary | ICD-10-CM | POA: Diagnosis not present

## 2017-12-17 DIAGNOSIS — Z1211 Encounter for screening for malignant neoplasm of colon: Secondary | ICD-10-CM | POA: Diagnosis not present

## 2017-12-17 DIAGNOSIS — Z6829 Body mass index (BMI) 29.0-29.9, adult: Secondary | ICD-10-CM | POA: Diagnosis not present

## 2018-07-16 DIAGNOSIS — I10 Essential (primary) hypertension: Secondary | ICD-10-CM | POA: Insufficient documentation

## 2018-07-16 DIAGNOSIS — Z8673 Personal history of transient ischemic attack (TIA), and cerebral infarction without residual deficits: Secondary | ICD-10-CM

## 2018-07-16 DIAGNOSIS — Z720 Tobacco use: Secondary | ICD-10-CM | POA: Insufficient documentation

## 2018-07-16 DIAGNOSIS — J449 Chronic obstructive pulmonary disease, unspecified: Secondary | ICD-10-CM

## 2018-07-16 HISTORY — DX: Personal history of transient ischemic attack (TIA), and cerebral infarction without residual deficits: Z86.73

## 2018-07-16 HISTORY — DX: Chronic obstructive pulmonary disease, unspecified: J44.9

## 2018-07-22 DIAGNOSIS — E782 Mixed hyperlipidemia: Secondary | ICD-10-CM | POA: Insufficient documentation

## 2019-03-03 ENCOUNTER — Emergency Department: Payer: Medicare HMO

## 2019-03-03 ENCOUNTER — Inpatient Hospital Stay (HOSPITAL_COMMUNITY)
Admit: 2019-03-03 | Discharge: 2019-03-03 | Disposition: A | Discharge status: Home / Self Care | Payer: Medicare HMO | Attending: Family Medicine | Admitting: Family Medicine

## 2019-03-03 ENCOUNTER — Inpatient Hospital Stay: Payer: Medicare HMO

## 2019-03-03 ENCOUNTER — Other Ambulatory Visit: Payer: Self-pay

## 2019-03-03 ENCOUNTER — Inpatient Hospital Stay
Admission: EM | Admit: 2019-03-03 | Discharge: 2019-03-07 | DRG: 175 | Disposition: A | Discharge status: Home / Self Care | Payer: Medicare HMO | Attending: Internal Medicine | Admitting: Internal Medicine

## 2019-03-03 DIAGNOSIS — Z8042 Family history of malignant neoplasm of prostate: Secondary | ICD-10-CM

## 2019-03-03 DIAGNOSIS — I2692 Saddle embolus of pulmonary artery without acute cor pulmonale: Secondary | ICD-10-CM

## 2019-03-03 DIAGNOSIS — I2510 Atherosclerotic heart disease of native coronary artery without angina pectoris: Secondary | ICD-10-CM | POA: Diagnosis present

## 2019-03-03 DIAGNOSIS — I10 Essential (primary) hypertension: Secondary | ICD-10-CM | POA: Diagnosis present

## 2019-03-03 DIAGNOSIS — Z20828 Contact with and (suspected) exposure to other viral communicable diseases: Secondary | ICD-10-CM | POA: Diagnosis present

## 2019-03-03 DIAGNOSIS — I2694 Multiple subsegmental pulmonary emboli without acute cor pulmonale: Secondary | ICD-10-CM | POA: Diagnosis not present

## 2019-03-03 DIAGNOSIS — Z66 Do not resuscitate: Secondary | ICD-10-CM

## 2019-03-03 DIAGNOSIS — I2609 Other pulmonary embolism with acute cor pulmonale: Principal | ICD-10-CM | POA: Diagnosis present

## 2019-03-03 DIAGNOSIS — Z86718 Personal history of other venous thrombosis and embolism: Secondary | ICD-10-CM | POA: Diagnosis not present

## 2019-03-03 DIAGNOSIS — E785 Hyperlipidemia, unspecified: Secondary | ICD-10-CM | POA: Diagnosis present

## 2019-03-03 DIAGNOSIS — F329 Major depressive disorder, single episode, unspecified: Secondary | ICD-10-CM | POA: Diagnosis present

## 2019-03-03 DIAGNOSIS — Z86711 Personal history of pulmonary embolism: Secondary | ICD-10-CM

## 2019-03-03 DIAGNOSIS — F17210 Nicotine dependence, cigarettes, uncomplicated: Secondary | ICD-10-CM | POA: Diagnosis present

## 2019-03-03 DIAGNOSIS — R918 Other nonspecific abnormal finding of lung field: Secondary | ICD-10-CM

## 2019-03-03 DIAGNOSIS — R509 Fever, unspecified: Secondary | ICD-10-CM

## 2019-03-03 DIAGNOSIS — Z8249 Family history of ischemic heart disease and other diseases of the circulatory system: Secondary | ICD-10-CM | POA: Diagnosis not present

## 2019-03-03 DIAGNOSIS — Z7902 Long term (current) use of antithrombotics/antiplatelets: Secondary | ICD-10-CM

## 2019-03-03 DIAGNOSIS — E876 Hypokalemia: Secondary | ICD-10-CM | POA: Diagnosis present

## 2019-03-03 DIAGNOSIS — Z823 Family history of stroke: Secondary | ICD-10-CM

## 2019-03-03 DIAGNOSIS — Z801 Family history of malignant neoplasm of trachea, bronchus and lung: Secondary | ICD-10-CM | POA: Diagnosis not present

## 2019-03-03 DIAGNOSIS — R0602 Shortness of breath: Secondary | ICD-10-CM

## 2019-03-03 DIAGNOSIS — I2699 Other pulmonary embolism without acute cor pulmonale: Secondary | ICD-10-CM | POA: Diagnosis present

## 2019-03-03 DIAGNOSIS — Z8673 Personal history of transient ischemic attack (TIA), and cerebral infarction without residual deficits: Secondary | ICD-10-CM | POA: Diagnosis not present

## 2019-03-03 HISTORY — DX: Other pulmonary embolism without acute cor pulmonale: I26.99

## 2019-03-03 HISTORY — DX: Do not resuscitate: Z66

## 2019-03-03 LAB — ECHOCARDIOGRAM COMPLETE
Height: 63 in
Weight: 2880 oz

## 2019-03-03 LAB — CBC
HCT: 44.6 % (ref 39.0–52.0)
Hemoglobin: 15.5 g/dL (ref 13.0–17.0)
MCH: 30.5 pg (ref 26.0–34.0)
MCHC: 34.8 g/dL (ref 30.0–36.0)
MCV: 87.8 fL (ref 80.0–100.0)
Platelets: 241 10*3/uL (ref 150–400)
RBC: 5.08 MIL/uL (ref 4.22–5.81)
RDW: 12.9 % (ref 11.5–15.5)
WBC: 11.9 10*3/uL — ABNORMAL HIGH (ref 4.0–10.5)
nRBC: 0 % (ref 0.0–0.2)

## 2019-03-03 LAB — CBC WITH DIFFERENTIAL/PLATELET
Abs Immature Granulocytes: 0.05 10*3/uL (ref 0.00–0.07)
Basophils Absolute: 0.1 10*3/uL (ref 0.0–0.1)
Basophils Relative: 1 %
Eosinophils Absolute: 0.1 10*3/uL (ref 0.0–0.5)
Eosinophils Relative: 1 %
HCT: 49.6 % (ref 39.0–52.0)
Hemoglobin: 17.1 g/dL — ABNORMAL HIGH (ref 13.0–17.0)
Immature Granulocytes: 0 %
Lymphocytes Relative: 22 %
Lymphs Abs: 3.2 10*3/uL (ref 0.7–4.0)
MCH: 30.3 pg (ref 26.0–34.0)
MCHC: 34.5 g/dL (ref 30.0–36.0)
MCV: 87.9 fL (ref 80.0–100.0)
Monocytes Absolute: 1.2 10*3/uL — ABNORMAL HIGH (ref 0.1–1.0)
Monocytes Relative: 8 %
Neutro Abs: 10.2 10*3/uL — ABNORMAL HIGH (ref 1.7–7.7)
Neutrophils Relative %: 68 %
Platelets: 281 10*3/uL (ref 150–400)
RBC: 5.64 MIL/uL (ref 4.22–5.81)
RDW: 12.8 % (ref 11.5–15.5)
WBC: 14.9 10*3/uL — ABNORMAL HIGH (ref 4.0–10.5)
nRBC: 0 % (ref 0.0–0.2)

## 2019-03-03 LAB — BRAIN NATRIURETIC PEPTIDE: B Natriuretic Peptide: 22 pg/mL (ref 0.0–100.0)

## 2019-03-03 LAB — BASIC METABOLIC PANEL
Anion gap: 10 (ref 5–15)
BUN: 11 mg/dL (ref 6–20)
CO2: 23 mmol/L (ref 22–32)
Calcium: 8.7 mg/dL — ABNORMAL LOW (ref 8.9–10.3)
Chloride: 101 mmol/L (ref 98–111)
Creatinine, Ser: 0.92 mg/dL (ref 0.61–1.24)
GFR calc Af Amer: >60 mL/min (ref >60)
GFR calc non Af Amer: >60 mL/min (ref >60)
Glucose, Bld: 93 mg/dL (ref 70–99)
Potassium: 3.9 mmol/L (ref 3.5–5.1)
Sodium: 134 mmol/L — ABNORMAL LOW (ref 135–145)

## 2019-03-03 LAB — APTT: aPTT: >160 seconds (ref 24–36)

## 2019-03-03 LAB — COMPREHENSIVE METABOLIC PANEL
ALT: 12 U/L (ref 0–44)
AST: 12 U/L — ABNORMAL LOW (ref 15–41)
Albumin: 4.1 g/dL (ref 3.5–5.0)
Alkaline Phosphatase: 104 U/L (ref 38–126)
Anion gap: 11 (ref 5–15)
BUN: 12 mg/dL (ref 6–20)
CO2: 24 mmol/L (ref 22–32)
Calcium: 9.5 mg/dL (ref 8.9–10.3)
Chloride: 102 mmol/L (ref 98–111)
Creatinine, Ser: 1.05 mg/dL (ref 0.61–1.24)
GFR calc Af Amer: >60 mL/min (ref >60)
GFR calc non Af Amer: >60 mL/min (ref >60)
Glucose, Bld: 102 mg/dL — ABNORMAL HIGH (ref 70–99)
Potassium: 3.4 mmol/L — ABNORMAL LOW (ref 3.5–5.1)
Sodium: 137 mmol/L (ref 135–145)
Total Bilirubin: 0.7 mg/dL (ref 0.3–1.2)
Total Protein: 7.9 g/dL (ref 6.5–8.1)

## 2019-03-03 LAB — TROPONIN I (HIGH SENSITIVITY)
Troponin I (High Sensitivity): 7 ng/L (ref <18)
Troponin I (High Sensitivity): 7 ng/L (ref <18)

## 2019-03-03 LAB — HEPARIN LEVEL (UNFRACTIONATED)
Heparin Unfractionated: 0.23 IU/mL — ABNORMAL LOW (ref 0.30–0.70)
Heparin Unfractionated: 0.23 IU/mL — ABNORMAL LOW (ref 0.30–0.70)

## 2019-03-03 LAB — SARS CORONAVIRUS 2 (TAT 6-24 HRS): SARS Coronavirus 2: NEGATIVE

## 2019-03-03 LAB — PROTIME-INR
INR: 1.2 (ref 0.8–1.2)
Prothrombin Time: 15.1 seconds (ref 11.4–15.2)

## 2019-03-03 LAB — MAGNESIUM: Magnesium: 2.1 mg/dL (ref 1.7–2.4)

## 2019-03-03 MED ORDER — OMEGA-3-ACID ETHYL ESTERS 1 G PO CAPS
1.0000 g | ORAL_CAPSULE | Freq: Every day | ORAL | Status: DC
  Administered 2019-03-03 – 2019-03-07 (×5): 1 g via ORAL
  Filled 2019-03-03 (×5): qty 1

## 2019-03-03 MED ORDER — LISINOPRIL 20 MG PO TABS
40.0000 mg | ORAL_TABLET | Freq: Every day | ORAL | Status: DC
  Administered 2019-03-03 – 2019-03-07 (×5): 40 mg via ORAL
  Filled 2019-03-03: qty 4
  Filled 2019-03-03 (×4): qty 2

## 2019-03-03 MED ORDER — MAGNESIUM HYDROXIDE 400 MG/5ML PO SUSP: 30.0000 mL | Freq: Every day | ORAL | Status: DC | PRN

## 2019-03-03 MED ORDER — WARFARIN SODIUM 5 MG PO TABS
5.0000 mg | ORAL_TABLET | Freq: Once | ORAL | Status: AC
  Administered 2019-03-03: 5 mg via ORAL
  Filled 2019-03-03: qty 1

## 2019-03-03 MED ORDER — ONDANSETRON HCL 4 MG PO TABS
4.0000 mg | ORAL_TABLET | Freq: Four times a day (QID) | ORAL | Status: DC | PRN
  Filled 2019-03-03: qty 1

## 2019-03-03 MED ORDER — ACETAMINOPHEN 325 MG PO TABS
650.0000 mg | ORAL_TABLET | Freq: Four times a day (QID) | ORAL | Status: DC | PRN
  Administered 2019-03-03: 650 mg via ORAL
  Filled 2019-03-03: qty 2

## 2019-03-03 MED ORDER — CLOPIDOGREL BISULFATE 75 MG PO TABS
75.0000 mg | ORAL_TABLET | Freq: Every day | ORAL | Status: DC
  Administered 2019-03-03 – 2019-03-07 (×5): 75 mg via ORAL
  Filled 2019-03-03 (×5): qty 1

## 2019-03-03 MED ORDER — METOPROLOL TARTRATE 50 MG PO TABS
50.0000 mg | ORAL_TABLET | Freq: Two times a day (BID) | ORAL | Status: DC
  Administered 2019-03-03 – 2019-03-07 (×10): 50 mg via ORAL
  Filled 2019-03-03 (×11): qty 1

## 2019-03-03 MED ORDER — WARFARIN SODIUM 7.5 MG PO TABS
7.5000 mg | ORAL_TABLET | Freq: Every day | ORAL | Status: DC
  Administered 2019-03-03: 7.5 mg via ORAL
  Filled 2019-03-03: qty 1

## 2019-03-03 MED ORDER — WARFARIN - PHARMACIST DOSING INPATIENT
Freq: Every day | Status: DC
  Filled 2019-03-03: qty 1

## 2019-03-03 MED ORDER — ONDANSETRON HCL 4 MG/2ML IJ SOLN: 4.0000 mg | Freq: Four times a day (QID) | INTRAMUSCULAR | Status: DC | PRN

## 2019-03-03 MED ORDER — TRAZODONE HCL 50 MG PO TABS
25.0000 mg | ORAL_TABLET | Freq: Every evening | ORAL | Status: DC | PRN
  Filled 2019-03-03: qty 0.5

## 2019-03-03 MED ORDER — SODIUM CHLORIDE 0.9 % IV SOLN: INTRAVENOUS | Status: DC

## 2019-03-03 MED ORDER — HEPARIN BOLUS VIA INFUSION
4000.0000 [IU] | Freq: Once | INTRAVENOUS | Status: AC
  Administered 2019-03-03: 02:00:00 4000 [IU] via INTRAVENOUS
  Filled 2019-03-03: qty 4000

## 2019-03-03 MED ORDER — IOHEXOL 350 MG/ML SOLN
75.0000 mL | Freq: Once | INTRAVENOUS | Status: AC | PRN
  Administered 2019-03-03: 75 mL via INTRAVENOUS

## 2019-03-03 MED ORDER — HEPARIN BOLUS VIA INFUSION
1000.0000 [IU] | Freq: Once | INTRAVENOUS | Status: AC
  Administered 2019-03-03: 1000 [IU] via INTRAVENOUS
  Filled 2019-03-03: qty 1000

## 2019-03-03 MED ORDER — HEPARIN (PORCINE) 25000 UT/250ML-% IV SOLN
2000.0000 [IU]/h | INTRAVENOUS | Status: DC
  Administered 2019-03-03: 18:00:00 1450 [IU]/h via INTRAVENOUS
  Administered 2019-03-03: 1200 [IU]/h via INTRAVENOUS
  Administered 2019-03-04: 1600 [IU]/h via INTRAVENOUS
  Administered 2019-03-05: 18:00:00 2000 [IU]/h via INTRAVENOUS
  Administered 2019-03-05: 02:00:00 1900 [IU]/h via INTRAVENOUS
  Administered 2019-03-06: 2000 [IU]/h via INTRAVENOUS
  Filled 2019-03-03 (×8): qty 250

## 2019-03-03 MED ORDER — IOHEXOL 350 MG/ML SOLN
100.0000 mL | Freq: Once | INTRAVENOUS | Status: AC | PRN
  Administered 2019-03-03: 100 mL via INTRAVENOUS

## 2019-03-03 MED ORDER — ACETAMINOPHEN 650 MG RE SUPP: 650.0000 mg | Freq: Four times a day (QID) | RECTAL | Status: DC | PRN

## 2019-03-03 MED ORDER — POTASSIUM CHLORIDE 20 MEQ PO PACK
40.0000 meq | PACK | Freq: Once | ORAL | Status: AC
  Administered 2019-03-03: 40 meq via ORAL
  Filled 2019-03-03: qty 2

## 2019-03-03 MED ORDER — HEPARIN BOLUS VIA INFUSION
1500.0000 [IU] | Freq: Once | INTRAVENOUS | Status: AC
  Administered 2019-03-03: 1500 [IU] via INTRAVENOUS
  Filled 2019-03-03: qty 1500

## 2019-03-03 MED ORDER — PRAVASTATIN SODIUM 40 MG PO TABS
40.0000 mg | ORAL_TABLET | Freq: Every day | ORAL | Status: DC
  Administered 2019-03-03 – 2019-03-06 (×4): 40 mg via ORAL
  Filled 2019-03-03 (×5): qty 1

## 2019-03-03 NOTE — Progress Notes (Signed)
ANTICOAGULATION CONSULT NOTE - Initial Consult  Pharmacy Consult for heparin bridge and start of warfarin  Indication: pulmonary embolus  Allergies  Allergen Reactions  . Penicillins     Patient Measurements: Height: 5\' 3"  (160 cm) Weight: 180 lb (81.6 kg) IBW/kg (Calculated) : 56.9 Heparin Dosing Weight: 66.8 kg  Vital Signs: Temp: 98.2 F (36.8 C) (12/21 0654) Temp Source: Oral (12/21 0654) BP: 141/95 (12/21 1000) Pulse Rate: 83 (12/21 1000)  Labs: Recent Labs    03/03/19 0012 03/03/19 0216 03/03/19 1001  HGB 17.1*  --  15.5  HCT 49.6  --  44.6  PLT 281  --  241  APTT  --  >160*  --   LABPROT  --  15.1  --   INR  --  1.2  --   HEPARINUNFRC  --   --  0.23*  CREATININE 1.05  --  0.92  TROPONINIHS 7 7  --     Estimated Creatinine Clearance: 86.7 mL/min (by C-G formula based on SCr of 0.92 mg/dL).   Medical History: Past Medical History:  Diagnosis Date  . CAD (coronary artery disease) 2012   [facility] No heart cath, treated medically [facility]  . History of stroke   . Hyperlipemia   . Hypertension   . PE (pulmonary thromboembolism) (HCC)     Medications:  Scheduled:  . clopidogrel  75 mg Oral Daily  . lisinopril  40 mg Oral Daily  . metoprolol tartrate  50 mg Oral BID  . omega-3 acid ethyl esters  1 g Oral Daily  . pravastatin  40 mg Oral Daily  . warfarin  7.5 mg Oral q1800  . Warfarin - Pharmacist Dosing Inpatient   Does not apply q1800    Assessment: Patient arrives x SOB and chest tightness, found to have bilateral pulmonary emboli w/ R heart strain. EKG showing sinus tach, HTN w/ systolic 123456 and HR [id number] - 120's. No anticoagulation PTA. Baseline CBC WNL w/ Hgb slightly above normal range, aPTT/INR pending. Patient is being started on heparin drip for anticoagulation of bilateral pulmonary emboli.  Current Heparin rate 1200 units/hr  12/21 1001 HL 0.23 - subtherapeutic  Goal of Therapy:  Heparin level 0.3-0.7 units/ml Monitor platelets by  anticoagulation protocol: Yes   Plan:  Will re-bolus heparin 1000 units IV x 1 Will increase rate to 1350 units/hr. Will check anti-Xa level @ 1700. Will monitor daily CBC's and adjust per anti-Xa levels.  Warfarin: Initial INR 1.2; Goal INR 2 - 3 Patient received warfarin 5 mg PO x 1. Will schedule dose of warfarin 7.5 mg daily and monitor INR trends. Will continue heparin bridge until patient achieves two therapeutic INRs and is overlapped for at least 5 days of bridge therapy.  Lu Duffel, PharmD, BCPS Clinical Pharmacist 03/03/2019 10:34 AM

## 2019-03-03 NOTE — Progress Notes (Signed)
PROGRESS NOTE    Scott Foley  [id number] DOB: [birth year] DOA: 03/03/2019 PCP: [name]       Assessment & Plan:   Active Problems:   Acute pulmonary embolism (HCC)   DNR (do not resuscitate)  Acute extensive bilateral pulmonary emboli: with subsequent right ventricular strain as per CTA chest. Will continue IV heparin drip and start coumadin. INR therapeutic range 2-3. Continue on tele. Echo does not show any right strain as per cardio. Cardio recs apprec.  B/l LE US shows positive for DVT on the right with occlusive clot seen in the popliteal, posterior tibial, and peroneal veins. Bilateral slow venous flow and blunted respiratory phasicity, suggest enhanced abdominal imaging to exclude iliac or IVC pathology. CT abd/pelvis w/ contrast shows common femoral vein, internal and external iliac veins and IVC are patent without venous thrombosis. Pt does not meet requirements for thrombectomy of DVT as per IR. Will need a hypercoagulable work-up in approx 3 months as this is the pt's second blood clot, previous blood clot approx 1-1.5 year ago. Hypercoagulable workup as per hematology as an outpatient. Pt's PCP took him off of coumadin after 6 months and put the pt on plavix for unknown reason.   Pulmonary infarct vs pneumonia: as per CTA chest. Likely secondary to pulmonary infarct as the pt has extensive B/l PE. Will hold off on abxs for now and continue to monitor closely  B/l pulmonary nodules: stable as per CTA chest. Etiology unclear  Leukocytosis: likely reactive. Will continue to monitor. No fevers.  Coronary artery disease: will continue his Plavix, beta-blocker   HLD: continue on statin  Hypokalemia: KCl repleated. Will continue to monitor   Hypertension: continue lopressor, lisinopril  Depression: severity unknown. Continue on home dose of  Wellbutrin XL.    DVT prophylaxis: IV heparin drip & coumadin Code Status: DNR Family Communication:    Disposition Plan:    Consultants:   cardio   Procedures: n/a   Antimicrobials: n/a    Subjective: Pt c/o shortness of breath especially w/ laying flat.   Objective: Vitals:   03/03/19 0600 03/03/19 0630 03/03/19 0645 03/03/19 0654  BP: 117/87 (!) 123/93    Pulse: 77 78 76   Resp: (!) 32 (!) 26    Temp:    98.2 F (36.8 C)  TempSrc:    Oral  SpO2: 95% 95% 96%   Weight:      Height:       No intake or output data in the 24 hours ending 03/03/19 0820 Filed Weights   03/03/19 0004  Weight: 81.6 kg    Examination:  General exam: Appears calm and comfortable  Respiratory system: diminished breath sounds b/l. No wheezes Cardiovascular system: S1 & S2 +. No , rubs, gallops or clicks. No pedal edema. Gastrointestinal system: Abdomen is nondistended, soft and nontender.  Normal bowel sounds heard. Central nervous system: Alert and oriented. Moves all 4 extremities  Psychiatry: Judgement and insight appear normal. Mood & affect appropriate.     Data Reviewed: I have personally reviewed following labs and imaging studies  CBC: Recent Labs  Lab 03/03/19 0012  WBC 14.9*  NEUTROABS 10.2*  HGB 17.1*  HCT 49.6  MCV 87.9  PLT [id number]   Basic Metabolic Panel: Recent Labs  Lab 03/03/19 0012  NA 137  K 3.4*  CL 102  CO2 24  GLUCOSE 102*  BUN 12  CREATININE 1.05  CALCIUM 9.5   GFR: Estimated Creatinine Clearance: 76 mL/min (by  C-G formula based on SCr of 1.05 mg/dL). Liver Function Tests: Recent Labs  Lab 03/03/19 0012  AST 12*  ALT 12  ALKPHOS 104  BILITOT 0.7  PROT 7.9  ALBUMIN 4.1   No results for input(s): LIPASE, AMYLASE in the last 168 hours. No results for input(s): AMMONIA in the last 168 hours. Coagulation Profile: Recent Labs  Lab 03/03/19 0216  INR 1.2   Cardiac Enzymes: No results for input(s): CKTOTAL, CKMB, CKMBINDEX, TROPONINI in the last 168 hours. BNP (last 3 results) No results for input(s): PROBNP in the last 8760  hours. HbA1C: No results for input(s): HGBA1C in the last 72 hours. CBG: No results for input(s): GLUCAP in the last 168 hours. Lipid Profile: No results for input(s): CHOL, HDL, LDLCALC, TRIG, CHOLHDL, LDLDIRECT in the last 72 hours. Thyroid Function Tests: No results for input(s): TSH, T4TOTAL, FREET4, T3FREE, THYROIDAB in the last 72 hours. Anemia Panel: No results for input(s): VITAMINB12, FOLATE, FERRITIN, TIBC, IRON, RETICCTPCT in the last 72 hours. Sepsis Labs: No results for input(s): PROCALCITON, LATICACIDVEN in the last 168 hours.  No results found for this or any previous visit (from the past 240 hour(s)).       Radiology Studies: DG Chest 1 View  Result Date: 03/03/2019 CLINICAL DATA:  Shortness of breath. Tachycardia. EXAM: CHEST  1 VIEW COMPARISON:  Radiograph 11/07/2017. CT 06/28/2017 FINDINGS: Low lung volumes. Normal heart size with unchanged mediastinal CT contours. Streaky bibasilar opacities, improved on the right and worsened on the left from prior exam. No pneumothorax or large pleural effusion. No pulmonary edema. No acute osseous abnormalities are seen. IMPRESSION: Streaky bibasilar opacities. These favor atelectasis or scarring, and are improved from 2019 radiograph on the right, but worsened on the left. Electronically Signed   By: Keith Rake M.D.   On: 03/03/2019 00:41   CT Angio Chest PE W/Cm &/Or Wo Cm  Result Date: 03/03/2019 CLINICAL DATA:  Shortness of breath. EXAM: CT ANGIOGRAPHY CHEST WITH CONTRAST TECHNIQUE: Multidetector CT imaging of the chest was performed using the standard protocol during bolus administration of intravenous contrast. Multiplanar CT image reconstructions and MIPs were obtained to evaluate the vascular anatomy. CONTRAST:  67mL OMNIPAQUE IOHEXOL 350 MG/ML SOLN COMPARISON:  June 28, 2017. FINDINGS: Cardiovascular: Contrast injection is sufficient to demonstrate satisfactory opacification of the pulmonary arteries to the  segmental level.There are acute bilateral pulmonary emboli involving the main right and left pulmonary arteries extending into the bilateral lobar branches primarily within the lower lobes. There is scattered segmental and subsegmental pulmonary emboli involving both the lower and upper lobes. The heart size is normal. There is CT evidence for right heart strain with the RV LV ratio measuring approximately 1.1. Coronary artery calcifications are noted. Thoracic aortic calcifications are noted. Mediastinum/Nodes: --No mediastinal or hilar lymphadenopathy. --No axillary lymphadenopathy. --No supraclavicular lymphadenopathy. --Normal thyroid gland. --The esophagus is unremarkable Lungs/Pleura: There are emphysematous changes bilaterally. There is a stable 8 mm pulmonary nodule in the right lower lobe. There are additional scattered small bilateral pulmonary nodules which appear to be stable from prior study. There is atelectasis at the lung bases. There is a developing airspace opacity at the left lung base. This may represent an infiltrate or evolving pulmonary infarct in the setting of acute pulmonary emboli. The trachea is unremarkable. There is no pneumothorax. There is a small left-sided pleural effusion. Upper Abdomen: No acute abnormality. Musculoskeletal: No chest wall abnormality. No acute or significant osseous findings. There appear to be bilateral cervical  ribs, a normal variant. Review of the MIP images confirms the above findings. IMPRESSION: 1. Extensive bilateral pulmonary emboli as detailed above with evidence for right heart strain with an RV/LV ratio measuring 1.1. 2. There is a developing airspace opacity at the left lung base. This may represent an infiltrate or evolving pulmonary infarct in the setting of acute pulmonary emboli. 3. Small left-sided pleural effusion. 4. Stable bilateral pulmonary nodules. Aortic Atherosclerosis (ICD10-I70.0) and Emphysema (ICD10-J43.9). These results were called by  telephone at the time of interpretation on 03/03/2019 at 1:53 am to provider [facility] , who verbally acknowledged these results. Electronically Signed   By: Constance Holster M.D.   On: 03/03/2019 01:55   US Venous Img Lower Bilateral (DVT)  Result Date: 03/03/2019 CLINICAL DATA:  Pulmonary embolism EXAM: BILATERAL LOWER EXTREMITY VENOUS DOPPLER ULTRASOUND TECHNIQUE: Gray-scale sonography with graded compression, as well as color Doppler and duplex ultrasound were performed to evaluate the lower extremity deep venous systems from the level of the common femoral vein and including the common femoral, femoral, profunda femoral, popliteal and calf veins including the posterior tibial, peroneal and gastrocnemius veins when visible. The superficial great saphenous vein was also interrogated. Spectral Doppler was utilized to evaluate flow at rest and with distal augmentation maneuvers in the common femoral, femoral and popliteal veins. COMPARISON:  Left leg ultrasound 06/12/2017 FINDINGS: RIGHT LOWER EXTREMITY Expansile mid level echoes within the right popliteal artery extending into the posterior tibial and 1 of the peroneal veins. Appearance is consistent with recent thrombosis. There is rouleoux formation and relatively blunted respiratory phasicity. LEFT LOWER EXTREMITY Negative for DVT. There is rouleoux formation and relatively blunted respiratory phasicity. IMPRESSION: 1. Positive for DVT on the right with occlusive clot seen in the popliteal, posterior tibial, and peroneal veins. 2. Bilateral slow venous flow and blunted respiratory phasicity, suggest enhanced abdominal imaging to exclude iliac or IVC pathology. Electronically Signed   By: Monte Fantasia M.D.   On: 03/03/2019 05:14        Scheduled Meds: . clopidogrel  75 mg Oral Daily  . lisinopril  40 mg Oral Daily  . metoprolol tartrate  50 mg Oral BID  . omega-3 acid ethyl esters  1 g Oral Daily  . pravastatin  40 mg Oral Daily  .  warfarin  7.5 mg Oral q1800  . Warfarin - Pharmacist Dosing Inpatient   Does not apply q1800   Continuous Infusions: . sodium chloride 100 mL/hr at 03/03/19 0319  . heparin 1,200 Units/hr (03/03/19 0205)     LOS: 0 days    Time spent: 35 mins    Wyvonnia Dusky, MD [facility] Pager [phone]  If 7PM-7AM, please contact night-coverage [email or web address] Password [email or web address] 03/03/2019, 8:20 AM

## 2019-03-03 NOTE — Progress Notes (Addendum)
ANTICOAGULATION CONSULT NOTE - Initial Consult  Pharmacy Consult for heparin bridge and start of warfarin  Indication: pulmonary embolus  Allergies  Allergen Reactions  . Penicillins     Patient Measurements: Height: 5\' 3"  (160 cm) Weight: 180 lb (81.6 kg) IBW/kg (Calculated) : 56.9 Heparin Dosing Weight: 66.8 kg  Vital Signs: Temp: 98.9 F (37.2 C) (12/21 0007) Temp Source: Oral (12/21 0007) BP: 140/105 (12/21 0116) Pulse Rate: 116 (12/21 0056)  Labs: Recent Labs    03/03/19 0012  HGB 17.1*  HCT 49.6  PLT 281  CREATININE 1.05  TROPONINIHS 7    Estimated Creatinine Clearance: 76 mL/min (by C-G formula based on SCr of 1.05 mg/dL).   Medical History: Past Medical History:  Diagnosis Date  . CAD (coronary artery disease) 2012   AMI No heart cath, treated medically [facility]  . History of stroke   . Hyperlipemia   . Hypertension   . PE (pulmonary thromboembolism) (HCC)     Medications:  Scheduled:  . heparin  4,000 Units Intravenous Once    Assessment: Patient arrives x SOB and chest tightness, found to have bilateral pulmonary emboli w/ R heart strain. EKG showing sinus tach, HTN w/ systolic 123456 and HR A999333 - 120's. No anticoagulation PTA. Baseline CBC WNL w/ Hgb slightly above normal range, aPTT/INR pending. Patient is being started on heparin drip for anticoagulation of bilateral pulmonary emboli.  Goal of Therapy:  Heparin level 0.3-0.7 units/ml Monitor platelets by anticoagulation protocol: Yes   Plan:  Will bolus heparin 4000 units IV x 1 Will start rate at 1200 units/hr. Will check anti-Xa level @ 0800. Will monitor daily CBC's and adjust per anti-Xa levels.  Warfarin: Initial INR 1.2; Goal INR 2 - 3 Patient received warfarin 5 mg PO x 1. Will schedule dose of warfarin 7.5 mg daily and monitor INR trends. Will continue heparin bridge until patient achieves two therapeutic INRs and is overlapped for at least 5 days of bridge therapy.  Tobie Lords, PharmD, BCPS Clinical Pharmacist 03/03/2019,1:59 AM

## 2019-03-03 NOTE — ED Notes (Signed)
US at bedside

## 2019-03-03 NOTE — ED Notes (Signed)
Pt taken to CT scan.

## 2019-03-03 NOTE — ED Provider Notes (Signed)
[facility] Emergency Department Provider Note   ____________________________________________   First MD Initiated Contact with Patient 03/03/19 0025     (approximate)  I have reviewed the triage vital signs and the nursing notes.   HISTORY  Chief Complaint Shortness of Breath    HPI Scott Foley is a 53 y.o. male with past medical history of CAD, PE, hypertension, hyperlipidemia who presents to the ED complaining of shortness of breath.  Patient reports that he had acute onset of shortness of breath earlier this evening.  This is associated with sharp pain in his chest, both of which seem to get worse when he goes to lay flat.  He has not noticed any pain or swelling in his legs, but describes symptoms as similar to prior PE.  He states he currently takes Plavix for anticoagulation, but denies any other blood thinners.  He has not had any recent fevers or cough and is not aware of any sick contacts.  He denies any history of COPD or CHF.        Past Medical History:  Diagnosis Date  . CAD (coronary artery disease) 2012   [facility] No heart cath, treated medically [facility]  . History of stroke   . Hyperlipemia   . Hypertension   . PE (pulmonary thromboembolism) [facility])     Patient Active Problem List   Diagnosis Date Noted  . Acute pulmonary embolism ([place]) 03/03/2019  . DNR (do not resuscitate) 03/03/2019  . Pulmonary nodules/lesions, multiple 11/25/2012  . Tobacco use disorder 11/25/2012  . CAD (coronary artery disease)   . Hypertension   . Hyperlipemia   . History of stroke     Past Surgical History:  Procedure Laterality Date  . TONSILLECTOMY      Prior to Admission medications   Medication Sig Start Date End Date Taking? Authorizing Provider  clopidogrel (PLAVIX) 75 MG tablet Take 75 mg by mouth daily.   Yes [provider]  lisinopril (PRINIVIL,ZESTRIL) 40 MG tablet Take 40 mg by mouth daily.   Yes [provider]    Omega-3 Fatty Acids (FISH OIL) 1000 MG CAPS Take by mouth 2 (two) times daily.   Yes [provider]  pravastatin (PRAVACHOL) 40 MG tablet Take 40 mg by mouth daily.   Yes [provider]  metoprolol (LOPRESSOR) 50 MG tablet Take 50 mg by mouth 2 (two) times daily.    [provider]    Allergies Penicillins  Family History  Problem Relation Age of Onset  . Hypertension Father   . Heart attack Father   . Stroke Father   . Prostate cancer Father   . Hypertension Sister   . Hypertension Brother   . Heart attack Brother   . Lung cancer Sister     Social History Social History   Tobacco Use  . Smoking status: Current Every Day Smoker    Packs/day: 0.50    Types: Cigarettes    Start date: 03/14/1979  . Smokeless tobacco: Never Used  Substance Use Topics  . Alcohol use: No  . Drug use: No    Review of Systems  Constitutional: No fever/chills Eyes: No visual changes. ENT: No sore throat. Cardiovascular: Positive for chest pain. Respiratory: Positive for shortness of breath. Gastrointestinal: No abdominal pain.  No nausea, no vomiting.  No diarrhea.  No constipation. Genitourinary: Negative for dysuria. Musculoskeletal: Negative for back pain. Skin: Negative for rash. Neurological: Negative for headaches, focal weakness or numbness.  ____________________________________________   PHYSICAL EXAM:  VITAL SIGNS: ED Triage Vitals  Enc Vitals Group     BP 03/03/19 0007 (!) 171/121     Pulse Rate 03/03/19 0007 (!) 122     Resp 03/03/19 0007 (!) 21     Temp 03/03/19 0007 98.9 F (37.2 C)     Temp Source 03/03/19 0007 Oral     SpO2 03/03/19 0007 96 %     Weight 03/03/19 0004 180 lb (81.6 kg)     Height 03/03/19 0004 5\' 3"  (1.6 m)     Head Circumference --      Peak Flow --      Pain Score 03/03/19 0004 8     Pain Loc --      Pain Edu? --      Excl. in [place]? --     Constitutional: Alert and oriented. Eyes: Conjunctivae are normal. Head:  Atraumatic. Nose: No congestion/rhinnorhea. Mouth/Throat: Mucous membranes are moist. Neck: Normal ROM Cardiovascular: Tachycardic, regular rhythm. Grossly normal heart sounds. Respiratory: Tachypneic without accessory muscle use.  No retractions. Lungs CTAB. Gastrointestinal: Soft and nontender. No distention. Genitourinary: deferred Musculoskeletal: No lower extremity tenderness nor edema. Neurologic:  Normal speech and language. No gross focal neurologic deficits are appreciated. Skin:  Skin is warm, dry and intact. No rash noted. Psychiatric: Mood and affect are normal. Speech and behavior are normal.  ____________________________________________   LABS (all labs ordered are listed, but only abnormal results are displayed)  Labs Reviewed  COMPREHENSIVE METABOLIC PANEL - Abnormal; Notable for the following components:      Result Value   Potassium 3.4 (*)    Glucose, Bld 102 (*)    AST 12 (*)    All other components within normal limits  CBC WITH DIFFERENTIAL/PLATELET - Abnormal; Notable for the following components:   WBC 14.9 (*)    Hemoglobin 17.1 (*)    Neutro Abs 10.2 (*)    Monocytes Absolute 1.2 (*)    All other components within normal limits  APTT - Abnormal; Notable for the following components:   aPTT >160 (*)    All other components within normal limits  SARS CORONAVIRUS 2 (TAT 6-24 HRS)  BRAIN NATRIURETIC PEPTIDE  PROTIME-INR  HEPARIN LEVEL (UNFRACTIONATED)  BASIC METABOLIC PANEL  CBC  MAGNESIUM  TROPONIN I (HIGH SENSITIVITY)  TROPONIN I (HIGH SENSITIVITY)   ____________________________________________  EKG  ED ECG REPORT I, Blake Divine, the attending physician, personally viewed and interpreted this ECG.   Date: 03/03/2019  EKG Time: 00:05  Rate: 122  Rhythm: sinus tachycardia  Axis: Normal  Intervals:none  ST&T Change: Inferior Q waves   PROCEDURES  Procedure(s) performed (including Critical Care):  .Critical Care Performed by:  Blake Divine, MD Authorized by: Blake Divine, MD   Critical care provider statement:    Critical care time (minutes):  45   Critical care time was exclusive of:  Separately billable procedures and treating other patients and teaching time   Critical care was necessary to treat or prevent imminent or life-threatening deterioration of the following conditions:  Circulatory failure   Critical care was time spent personally by me on the following activities:  Discussions with consultants, evaluation of patient's response to treatment, examination of patient, ordering and performing treatments and interventions, ordering and review of laboratory studies, ordering and review of radiographic studies, pulse oximetry, re-evaluation of patient's condition, obtaining history from patient or surrogate and review of old charts   I assumed direction of critical care  for this patient from another provider in my specialty: no       ____________________________________________   INITIAL IMPRESSION / ASSESSMENT AND PLAN / ED COURSE       53 year old male with history of CAD and PE presents to the ED complaining of acute sudden onset chest pain and shortness of breath earlier this evening, both of which seem to be worse when he goes to lay flat.  EKG shows sinus tachycardia with Q waves inferiorly and I would have a high suspicion for recurrent PE given his history.  CTA was obtained and positive for PE with evidence of right heart strain, although his troponin and BNP were within normal limits.  Patient was started on a heparin drip and remained hemodynamically stable.  Remainder of his lab work is unremarkable, no evidence of infectious process.  Case discussed with hospitalist, who accepts patient for admission.      ____________________________________________   FINAL CLINICAL IMPRESSION(S) / ED DIAGNOSES  Final diagnoses:  Other acute pulmonary embolism with acute cor pulmonale (HCC)    Shortness of breath     ED Discharge Orders    None       Note:  This document was prepared using Dragon voice recognition software and may include unintentional dictation errors.   Blake Divine, MD 03/03/19 [phone]

## 2019-03-03 NOTE — H&P (Addendum)
[place] [facility] [place] NAME: Scott Foley    MR#:  [id number]  DATE OF BIRTH:  Aug 07, 1965  DATE OF ADMISSION:  03/03/2019  PRIMARY CARE PHYSICIAN: [name]   REQUESTING/REFERRING PHYSICIAN: Blake Divine, MD CHIEF COMPLAINT:   Chief Complaint  Patient presents with  . Shortness of Breath    HISTORY OF PRESENT ILLNESS:  Scott Foley  is a 53 y.o. Caucasian male with a known history of coronary artery disease, hypertension dyslipidemia, who presented to emergency room with onset of dyspnea and dry cough for the last day with associated dyspnea on exertion.  He denied any chest pain or palpitations.  No fever or chills.  No orthopnea or paroxysmal nocturnal dyspnea or lower extremity edema.  No headache or dizziness or blurred vision.  He denied any nausea or vomiting or abdominal pain.  No bleeding diathesis.  Upon presentation to the emergency room, blood pressure was 171/121 with a pulse of 122 respiratory to 21 pulse 70 was 96% on room air with temperature of 98.8 and later 99.2.  Labs were remarkable for mild hypokalemia of 3.4 and leukocytosis of 14.9 with hemoconcentration and neutrophilia.  BNP was only 22 and high-sensitivity troponin I was 7.  EKG showed sinus tachycardia with a rate of 122 with Q waves inferiorly and repeat EKG showed sinus tachycardia with a rate of 116 with left axis deviation.  Portable chest x-ray showed streaky bibasilar opacities likely atelectasis or scarring.  This is improved from 2019 on the right but worsened on the left. Chest CT angiogram revealed extensive bilateral pulmonary emboli with evidence of right heart strain and RV/LV ratio measuring 1.1.  There is a developing airspace opacity in the left lung base that may represent infiltrate or evolving pulmonary infarct in the setting of acute pulmonary emboli.  It showed small left pleural effusion and stable bilateral pulmonary nodules, aortic atherosclerosis and  emphysema..  The patient was given IV heparin bolus and drip.  He will be admitted to a telemetry bed for further evaluation and management. PAST MEDICAL HISTORY:   Past Medical History:  Diagnosis Date  . CAD (coronary artery disease) 2012   [facility] No heart cath, treated medically [facility]  . History of stroke   . Hyperlipemia   . Hypertension   . PE (pulmonary thromboembolism) ([place])     PAST SURGICAL HISTORY:   Past Surgical History:  Procedure Laterality Date  . TONSILLECTOMY      SOCIAL HISTORY:   Social History   Tobacco Use  . Smoking status: Current Every Day Smoker    Packs/day: 0.50    Types: Cigarettes    Start date: 03/14/1979  . Smokeless tobacco: Never Used  Substance Use Topics  . Alcohol use: No    FAMILY HISTORY:   Family History  Problem Relation Age of Onset  . Hypertension Father   . Heart attack Father   . Stroke Father   . Prostate cancer Father   . Hypertension Sister   . Hypertension Brother   . Heart attack Brother   . Lung cancer Sister     DRUG ALLERGIES:   Allergies  Allergen Reactions  . Penicillins     REVIEW OF SYSTEMS:   ROS As per history of present illness. All pertinent systems were reviewed above. Constitutional,  HEENT, cardiovascular, respiratory, GI, GU, musculoskeletal, neuro, psychiatric, endocrine,  integumentary and hematologic systems were reviewed and are otherwise  negative/unremarkable except for positive findings  mentioned above in the HPI.   MEDICATIONS AT HOME:   Prior to Admission medications   Medication Sig Start Date End Date Taking? Authorizing Provider  clopidogrel (PLAVIX) 75 MG tablet Take 75 mg by mouth daily.    [provider]  lisinopril (PRINIVIL,ZESTRIL) 40 MG tablet Take 40 mg by mouth daily.    [provider]  metoprolol (LOPRESSOR) 50 MG tablet Take 50 mg by mouth 2 (two) times daily.    [provider]  nicotine (NICOTROL) 10 MG inhaler Take 60- 80 puff per  cartridge 6 cartridges per day 06/24/13   Elsie Stain, MD  Omega-3 Fatty Acids (FISH OIL) 1000 MG CAPS Take by mouth 2 (two) times daily.    [provider]  pravastatin (PRAVACHOL) 40 MG tablet Take 40 mg by mouth daily.    [provider]      VITAL SIGNS:  Blood pressure (!) 140/105, pulse (!) 116, temperature 99.2 F (37.3 C), temperature source Oral, resp. rate (!) 31, height 5\' 3"  (1.6 m), weight 81.6 kg, SpO2 96 %.  PHYSICAL EXAMINATION:  Physical Exam  GENERAL:  53 y.o.-year-old Caucasian male patient lying in the bed with no acute distress.  EYES: Pupils equal, round, reactive to light and accommodation. No scleral icterus. Extraocular muscles intact.  HEENT: Head atraumatic, normocephalic. Oropharynx and nasopharynx clear.  NECK:  Supple, no jugular venous distention. No thyroid enlargement, no tenderness.  LUNGS: Normal breath sounds bilaterally, no wheezing, rhonchi or crepitation. No use of accessory muscles of respiration.  He had minimal left basal crackles. CARDIOVASCULAR: Regular rate and rhythm, S1, S2 normal. No murmurs, rubs, or gallops.  ABDOMEN: Soft, nondistended, nontender. Bowel sounds present. No organomegaly or mass.  EXTREMITIES: No pedal edema, cyanosis, or clubbing.  NEUROLOGIC: Cranial nerves II through XII are intact. Muscle strength 5/5 in all extremities. Sensation intact. Gait not checked.  PSYCHIATRIC: The patient is alert and oriented x 3.  Normal affect and good eye contact. SKIN: No obvious rash, lesion, or ulcer.   LABORATORY PANEL:   CBC Recent Labs  Lab 03/03/19 0012  WBC 14.9*  HGB 17.1*  HCT 49.6  PLT 281   ------------------------------------------------------------------------------------------------------------------  Chemistries  Recent Labs  Lab 03/03/19 0012  NA 137  K 3.4*  CL 102  CO2 24  GLUCOSE 102*  BUN 12  CREATININE 1.05  CALCIUM 9.5  AST 12*  ALT 12  ALKPHOS 104  BILITOT 0.7    ------------------------------------------------------------------------------------------------------------------  Cardiac Enzymes No results for input(s): TROPONINI in the last 168 hours. ------------------------------------------------------------------------------------------------------------------  RADIOLOGY:  DG Chest 1 View  Result Date: 03/03/2019 CLINICAL DATA:  Shortness of breath. Tachycardia. EXAM: CHEST  1 VIEW COMPARISON:  Radiograph 11/07/2017. CT 06/28/2017 FINDINGS: Low lung volumes. Normal heart size with unchanged mediastinal CT contours. Streaky bibasilar opacities, improved on the right and worsened on the left from prior exam. No pneumothorax or large pleural effusion. No pulmonary edema. No acute osseous abnormalities are seen. IMPRESSION: Streaky bibasilar opacities. These favor atelectasis or scarring, and are improved from 2019 radiograph on the right, but worsened on the left. Electronically Signed   By: Keith Rake M.D.   On: 03/03/2019 00:41   CT Angio Chest PE W/Cm &/Or Wo Cm  Result Date: 03/03/2019 CLINICAL DATA:  Shortness of breath. EXAM: CT ANGIOGRAPHY CHEST WITH CONTRAST TECHNIQUE: Multidetector CT imaging of the chest was performed using the standard protocol during bolus administration of intravenous contrast. Multiplanar CT image reconstructions and MIPs were obtained  to evaluate the vascular anatomy. CONTRAST:  61mL OMNIPAQUE IOHEXOL 350 MG/ML SOLN COMPARISON:  June 28, 2017. FINDINGS: Cardiovascular: Contrast injection is sufficient to demonstrate satisfactory opacification of the pulmonary arteries to the segmental level.There are acute bilateral pulmonary emboli involving the main right and left pulmonary arteries extending into the bilateral lobar branches primarily within the lower lobes. There is scattered segmental and subsegmental pulmonary emboli involving both the lower and upper lobes. The heart size is normal. There is CT evidence for  right heart strain with the RV LV ratio measuring approximately 1.1. Coronary artery calcifications are noted. Thoracic aortic calcifications are noted. Mediastinum/Nodes: --No mediastinal or hilar lymphadenopathy. --No axillary lymphadenopathy. --No supraclavicular lymphadenopathy. --Normal thyroid gland. --The esophagus is unremarkable Lungs/Pleura: There are emphysematous changes bilaterally. There is a stable 8 mm pulmonary nodule in the right lower lobe. There are additional scattered small bilateral pulmonary nodules which appear to be stable from prior study. There is atelectasis at the lung bases. There is a developing airspace opacity at the left lung base. This may represent an infiltrate or evolving pulmonary infarct in the setting of acute pulmonary emboli. The trachea is unremarkable. There is no pneumothorax. There is a small left-sided pleural effusion. Upper Abdomen: No acute abnormality. Musculoskeletal: No chest wall abnormality. No acute or significant osseous findings. There appear to be bilateral cervical ribs, a normal variant. Review of the MIP images confirms the above findings. IMPRESSION: 1. Extensive bilateral pulmonary emboli as detailed above with evidence for right heart strain with an RV/LV ratio measuring 1.1. 2. There is a developing airspace opacity at the left lung base. This may represent an infiltrate or evolving pulmonary infarct in the setting of acute pulmonary emboli. 3. Small left-sided pleural effusion. 4. Stable bilateral pulmonary nodules. Aortic Atherosclerosis (ICD10-I70.0) and Emphysema (ICD10-J43.9). These results were called by telephone at the time of interpretation on 03/03/2019 at 1:53 am to provider [facility] , who verbally acknowledged these results. Electronically Signed   By: Constance Holster M.D.   On: 03/03/2019 01:55      IMPRESSION AND PLAN:   1.  Acute extensive bilateral pulmonary emboli with subsequent right ventricular strain.  Patient  will be admitted to a telemetry bed.  We will continue IV heparin drip and start p.o. Coumadin.  Will obtain a 2D echo to further assess for his right ventricular strain.  Will obtain bilateral lower extremity venous duplex.  2.  Coronary artery disease.  We will continue his Plavix, beta-blocker and statin therapy.  3.  Hypokalemia.  Potassium will be replaced and magnesium level will be checked.  4.  Hypertension.  We will continue Lopressor and Zestril.  5.  Dyslipidemia.  We will continue statin therapy and fish oil.  6.  Depression.  We will continue his Wellbutrin XL.  7.  DVT prophylaxis.  The patient will be on IV heparin.  All the records are reviewed and case discussed with ED provider. The plan of care was discussed in details with the patient (and family). I answered all questions. The patient agreed to proceed with the above mentioned plan. Further management will depend upon hospital course.   CODE STATUS: I have discussed the CODE STATUS with the patient and he clearly desires to be DNR/DNI.  TOTAL TIME TAKING CARE OF THIS PATIENT: 55 minutes.    Christel Mormon M.D on 03/03/2019 at 2:25 AM  [facility]   From 7 PM-7 AM, contact night-coverage [email or web address]  CC: Primary care physician; [name]   Note: This dictation was prepared with Sales executive along with smaller Company secretary. Any transcriptional errors that result from this process are unintentional.

## 2019-03-03 NOTE — ED Notes (Signed)
Report provided to Adonis Huguenin, [place]

## 2019-03-03 NOTE — ED Notes (Signed)
Resting in bed, no distress at this time. Call light in reach, bed locked and low.

## 2019-03-03 NOTE — Progress Notes (Signed)
*  PRELIMINARY RESULTS* Echocardiogram 2D Echocardiogram has been performed.  Sherrie Sport 03/03/2019, 1:03 PM

## 2019-03-03 NOTE — ED Notes (Signed)
Pt departed for CT

## 2019-03-03 NOTE — ED Notes (Signed)
Dr. Charna Archer notified regarding continued htn. Order for lopressor po in computer, will administer when available.

## 2019-03-03 NOTE — Progress Notes (Addendum)
ANTICOAGULATION CONSULT NOTE - Initial Consult  Pharmacy Consult for heparin bridge and start of warfarin  Indication: pulmonary embolus  Allergies  Allergen Reactions  . Penicillins     Patient Measurements: Height: 5\' 3"  (160 cm) Weight: 180 lb (81.6 kg) IBW/kg (Calculated) : 56.9 Heparin Dosing Weight: 66.8 kg  Vital Signs: Temp: 98.2 F (36.8 C) (12/21 0654) Temp Source: Oral (12/21 0654) BP: 141/95 (12/21 1000) Pulse Rate: 83 (12/21 1000)  Labs: Recent Labs    03/03/19 0012 03/03/19 0216 03/03/19 1001 03/03/19 1719  HGB 17.1*  --  15.5  --   HCT 49.6  --  44.6  --   PLT 281  --  241  --   APTT  --  >160*  --   --   LABPROT  --  15.1  --   --   INR  --  1.2  --   --   HEPARINUNFRC  --   --  0.23* 0.23*  CREATININE 1.05  --  0.92  --   TROPONINIHS 7 7  --   --     Estimated Creatinine Clearance: 86.7 mL/min (by C-G formula based on SCr of 0.92 mg/dL).   Medical History: Past Medical History:  Diagnosis Date  . CAD (coronary artery disease) 2012   [facility] No heart cath, treated medically [facility]  . History of stroke   . Hyperlipemia   . Hypertension   . PE (pulmonary thromboembolism) ([facility])     Medications:  Scheduled:  . clopidogrel  75 mg Oral Daily  . heparin  1,500 Units Intravenous Once  . lisinopril  40 mg Oral Daily  . metoprolol tartrate  50 mg Oral BID  . omega-3 acid ethyl esters  1 g Oral Daily  . pravastatin  40 mg Oral Daily  . warfarin  7.5 mg Oral q1800  . Warfarin - Pharmacist Dosing Inpatient   Does not apply q1800    Assessment: Patient arrives x SOB and chest tightness, found to have bilateral pulmonary emboli w/ R heart strain. EKG showing sinus tach, HTN w/ systolic 123456 and HR [id number] - 120's. No anticoagulation PTA. Baseline CBC WNL w/ Hgb slightly above normal range, aPTT/INR pending. Patient is being started on heparin drip for anticoagulation of bilateral pulmonary emboli.  Current Heparin rate 1200 units/hr  12/21 1001 HL  0.23 - subtherapeutic 12/21 1719 HL 0.23 - subtherapeutic  Goal of Therapy:  Heparin level 0.3-0.7 units/ml Monitor platelets by anticoagulation protocol: Yes   Plan:  Will re-bolus heparin 1500 units IV x 1 Will increase rate to 1450 units/hr. Will check anti-Xa level @ 0000 on 12/22. Will monitor daily CBC's and adjust per anti-Xa levels.  Warfarin: Initial INR 1.2; Goal INR 2 - 3 Patient received warfarin 5 mg PO x 1. Will schedule dose of warfarin 7.5 mg daily and monitor INR trends. Will continue heparin bridge until patient achieves two therapeutic INRs and is overlapped for at least 5 days of bridge therapy.  Pearla Dubonnet, PharmD Clinical Pharmacist 03/03/2019 5:43 PM

## 2019-03-03 NOTE — ED Triage Notes (Signed)
Patient c/o SOB and medial chest tightness when laying down. Patient is on plavix for previous PE 2 years ago (Plavix).

## 2019-03-03 NOTE — ED Notes (Signed)
[place], MD notified of pt BP, no orders received at this time

## 2019-03-04 LAB — HEPARIN LEVEL (UNFRACTIONATED)
Heparin Unfractionated: 0.12 IU/mL — ABNORMAL LOW (ref 0.30–0.70)
Heparin Unfractionated: 0.23 IU/mL — ABNORMAL LOW (ref 0.30–0.70)
Heparin Unfractionated: 0.42 IU/mL (ref 0.30–0.70)
Heparin Unfractionated: <0.1 IU/mL — ABNORMAL LOW (ref 0.30–0.70)

## 2019-03-04 LAB — CBC
HCT: 44 % (ref 39.0–52.0)
Hemoglobin: 15.5 g/dL (ref 13.0–17.0)
MCH: 30.3 pg (ref 26.0–34.0)
MCHC: 35.2 g/dL (ref 30.0–36.0)
MCV: 86.1 fL (ref 80.0–100.0)
Platelets: 244 10*3/uL (ref 150–400)
RBC: 5.11 MIL/uL (ref 4.22–5.81)
RDW: 13.2 % (ref 11.5–15.5)
WBC: 12.3 10*3/uL — ABNORMAL HIGH (ref 4.0–10.5)
nRBC: 0 % (ref 0.0–0.2)

## 2019-03-04 LAB — BASIC METABOLIC PANEL
Anion gap: 10 (ref 5–15)
BUN: 13 mg/dL (ref 6–20)
CO2: 20 mmol/L — ABNORMAL LOW (ref 22–32)
Calcium: 8.5 mg/dL — ABNORMAL LOW (ref 8.9–10.3)
Chloride: 104 mmol/L (ref 98–111)
Creatinine, Ser: 0.88 mg/dL (ref 0.61–1.24)
GFR calc Af Amer: >60 mL/min (ref >60)
GFR calc non Af Amer: >60 mL/min (ref >60)
Glucose, Bld: 96 mg/dL (ref 70–99)
Potassium: 3.9 mmol/L (ref 3.5–5.1)
Sodium: 134 mmol/L — ABNORMAL LOW (ref 135–145)

## 2019-03-04 LAB — PROTIME-INR
INR: 1.2 (ref 0.8–1.2)
Prothrombin Time: 15.2 seconds (ref 11.4–15.2)

## 2019-03-04 MED ORDER — LEVOFLOXACIN IN D5W 750 MG/150ML IV SOLN
750.0000 mg | Freq: Every day | INTRAVENOUS | Status: DC
  Administered 2019-03-04: 750 mg via INTRAVENOUS
  Filled 2019-03-04 (×4): qty 150

## 2019-03-04 MED ORDER — HEPARIN BOLUS VIA INFUSION
1500.0000 [IU] | INTRAVENOUS | Status: AC
  Administered 2019-03-04: 1500 [IU] via INTRAVENOUS
  Filled 2019-03-04: qty 1500

## 2019-03-04 MED ORDER — WARFARIN SODIUM 10 MG PO TABS
10.0000 mg | ORAL_TABLET | Freq: Once | ORAL | Status: AC
  Administered 2019-03-04: 10 mg via ORAL
  Filled 2019-03-04: qty 1

## 2019-03-04 MED ORDER — HEPARIN BOLUS VIA INFUSION
2200.0000 [IU] | Freq: Once | INTRAVENOUS | Status: AC
  Administered 2019-03-04: 2200 [IU] via INTRAVENOUS
  Filled 2019-03-04: qty 2200

## 2019-03-04 NOTE — Progress Notes (Signed)
[place] for heparin bridge and start of warfarin  Indication: pulmonary embolus  Allergies  Allergen Reactions  . Penicillins     Patient Measurements: Height: 5\' 3"  (QA348G cm) Weight: 185 lb 14.4 oz (84.3 kg) IBW/kg (Calculated) : 56.9 Heparin Dosing Weight: 66.8 kg  Vital Signs: Temp: 98.2 F (36.8 C) (12/22 0827) Temp Source: Oral (12/22 0827) BP: 158/99 (12/22 0827) Pulse Rate: 83 (12/22 0827)  Labs: Recent Labs    03/03/19 0012 03/03/19 0012 03/03/19 0216 03/03/19 1001 03/03/19 2347 03/04/19 0520 03/04/19 0734 03/04/19 1400  HGB 17.1*  --   --  15.5  --  15.5  --   --   HCT 49.6  --   --  44.6  --  44.0  --   --   PLT 281  --   --  241  --  244  --   --   APTT  --   --  >160*  --   --   --   --   --   LABPROT  --   --  15.1  --   --  15.2  --   --   INR  --   --  1.2  --   --  1.2  --   --   HEPARINUNFRC  --    < >  --  0.23* 0.23*  --  0.42 <0.10*  CREATININE 1.05  --   --  0.92  --  0.88  --   --   TROPONINIHS 7  --  7  --   --   --   --   --    < > = values in this interval not displayed.    Estimated Creatinine Clearance: 92.2 mL/min (by C-G formula based on SCr of 0.88 mg/dL).   Medical History: Past Medical History:  Diagnosis Date  . CAD (coronary artery disease) 2012   [facility] No heart cath, treated medically [facility]  . History of stroke   . Hyperlipemia   . Hypertension   . PE (pulmonary thromboembolism) ([facility])     Medications:  Scheduled:  . clopidogrel  75 mg Oral Daily  . lisinopril  40 mg Oral Daily  . metoprolol tartrate  50 mg Oral BID  . omega-3 acid ethyl esters  1 g Oral Daily  . pravastatin  40 mg Oral Daily  . warfarin  10 mg Oral ONCE-1800  . Warfarin - Pharmacist Dosing Inpatient   Does not apply q1800    Assessment: Patient arrives x SOB and chest tightness, found to have bilateral pulmonary emboli w/ R heart strain. EKG showing sinus tach, HTN w/ systolic 123456 and HR [id number] - 120's. No  anticoagulation PTA. Baseline CBC WNL w/ Hgb slightly above normal range, aPTT/INR pending. Patient is being started on heparin drip for anticoagulation of bilateral pulmonary emboli.  Current Heparin rate 1200 units/hr  12/21 1001 HL 0.23 - subtherapeutic 12/21 1719 HL 0.23 - subtherapeutic 12/21 2347 HL 0.23 - subtherapeutic 12/22 0734 HL 0.42 - therapeutic 12/22 1400 HL < 0.1 - heparin was stopped at 1315 and restarted around 1515.   Date INR Warfarin Dose  12/21 1.2 5 mg  12/22 1.2 7.5 mg    Goal of Therapy:  Heparin level 0.3-0.7 units/ml Monitor platelets by anticoagulation protocol: Yes   Plan:  Heparin was stopped at 1315 and restarted around 1515. Will continue current rate at 1600 units/hr. Will recheck anti-Xa  level in 6 hours. Daily CBC while on heparin. CBC stable.  Warfarin: Day 2 out of 5 bridge with heparin. INR subtherapeutic at 1.2. Goal INR 2 - 3. Pt is 53 yrs with a BMI of 32. Pt started on levofloxacin, which can increase INR. Will give 10 mg x 1 tonight to help get INR therapeutic and adjust depending on the trend of INR. Daily INR ordered. Pt started on levofloxacin  Will continue heparin bridge until patient achieves two therapeutic INRs and is overlapped for at least 5 days of bridge therapy.  Oswald Hillock, PharmD, BCPS Clinical Pharmacist 03/04/2019 3:18 PM

## 2019-03-04 NOTE — Progress Notes (Addendum)
[place] for heparin bridge and start of warfarin  Indication: pulmonary embolus  Allergies  Allergen Reactions  . Penicillins     Patient Measurements: Height: 5\' 3"  (160 cm) Weight: 185 lb 14.4 oz (84.3 kg) IBW/kg (Calculated) : 56.9 Heparin Dosing Weight: 66.8 kg  Vital Signs: Temp: 98.2 F (36.8 C) (12/22 0542) Temp Source: Oral (12/22 0542) BP: 163/97 (12/22 0542) Pulse Rate: 81 (12/22 0542)  Labs: Recent Labs    03/03/19 0012 03/03/19 0216 03/03/19 1001 03/03/19 1719 03/03/19 2347 03/04/19 0520  HGB 17.1*  --  15.5  --   --  15.5  HCT 49.6  --  44.6  --   --  44.0  PLT 281  --  241  --   --  244  APTT  --  >160*  --   --   --   --   LABPROT  --  15.1  --   --   --  15.2  INR  --  1.2  --   --   --  1.2  HEPARINUNFRC  --   --  0.23* 0.23* 0.23*  --   CREATININE 1.05  --  0.92  --   --  0.88  TROPONINIHS 7 7  --   --   --   --     Estimated Creatinine Clearance: 92.2 mL/min (by C-G formula based on SCr of 0.88 mg/dL).   Medical History: Past Medical History:  Diagnosis Date  . CAD (coronary artery disease) 2012   [facility] No heart cath, treated medically [facility]  . History of stroke   . Hyperlipemia   . Hypertension   . PE (pulmonary thromboembolism) ([facility])     Medications:  Scheduled:  . clopidogrel  75 mg Oral Daily  . lisinopril  40 mg Oral Daily  . metoprolol tartrate  50 mg Oral BID  . omega-3 acid ethyl esters  1 g Oral Daily  . pravastatin  40 mg Oral Daily  . warfarin  7.5 mg Oral q1800  . Warfarin - Pharmacist Dosing Inpatient   Does not apply q1800    Assessment: Patient arrives x SOB and chest tightness, found to have bilateral pulmonary emboli w/ R heart strain. EKG showing sinus tach, HTN w/ systolic 123456 and HR [id number] - 120's. No anticoagulation PTA. Baseline CBC WNL w/ Hgb slightly above normal range, aPTT/INR pending. Patient is being started on heparin drip for anticoagulation of bilateral  pulmonary emboli.  Current Heparin rate 1200 units/hr  12/21 1001 HL 0.23 - subtherapeutic 12/21 1719 HL 0.23 - subtherapeutic 12/21 2347 HL 0.23 - subtherapeutic 12/22 0734 HL 0.42 - therapeutic  Date INR Warfarin Dose  12/21 1.2 5 mg  12/22 1.2 7.5 mg    Goal of Therapy:  Heparin level 0.3-0.7 units/ml Monitor platelets by anticoagulation protocol: Yes   Plan:  Heparin level is therapeutic. Will continue current rate at 1600 units/hr. Will recheck anti-Xa level in 6 hours. Daily CBC while on heparin. CBC stable.  Warfarin: Day 2 out of 5 bridge with heparin. INR subtherapeutic at 1.2. Goal INR 2 - 3. Pt is 53 yrs with a BMI of 32. Pt started on levofloxacin, which can increase INR. Will give 10 mg x 1 tonight to help get INR therapeutic and adjust depending on the trend of INR. Daily INR ordered. Pt started on levofloxacin  Will continue heparin bridge until patient achieves two therapeutic INRs and is overlapped for  at least 5 days of bridge therapy.  Oswald Hillock, PharmD, BCPS Clinical Pharmacist 03/04/2019 7:34 AM

## 2019-03-04 NOTE — Progress Notes (Signed)
[place] for heparin bridge and start of warfarin  Indication: pulmonary embolus  Allergies  Allergen Reactions  . Penicillins     Patient Measurements: Height: 5\' 3"  (160 cm) Weight: 180 lb (81.6 kg) IBW/kg (Calculated) : 56.9 Heparin Dosing Weight: 66.8 kg  Vital Signs: Temp: 100 F (37.8 C) (12/21 2029) Temp Source: Oral (12/21 2029) BP: 154/94 (12/21 2029) Pulse Rate: 91 (12/21 2029)  Labs: Recent Labs    03/03/19 0012 03/03/19 0216 03/03/19 1001 03/03/19 1719 03/03/19 2347  HGB 17.1*  --  15.5  --   --   HCT 49.6  --  44.6  --   --   PLT 281  --  241  --   --   APTT  --  >160*  --   --   --   LABPROT  --  15.1  --   --   --   INR  --  1.2  --   --   --   HEPARINUNFRC  --   --  0.23* 0.23* 0.23*  CREATININE 1.05  --  0.92  --   --   TROPONINIHS 7 7  --   --   --     Estimated Creatinine Clearance: 86.7 mL/min (by C-G formula based on SCr of 0.92 mg/dL).   Medical History: Past Medical History:  Diagnosis Date  . CAD (coronary artery disease) 2012   [facility] No heart cath, treated medically [facility]  . History of stroke   . Hyperlipemia   . Hypertension   . PE (pulmonary thromboembolism) (H[facility])     Medications:  Scheduled:  . clopidogrel  75 mg Oral Daily  . lisinopril  40 mg Oral Daily  . metoprolol tartrate  50 mg Oral BID  . omega-3 acid ethyl esters  1 g Oral Daily  . pravastatin  40 mg Oral Daily  . warfarin  7.5 mg Oral q1800  . Warfarin - Pharmacist Dosing Inpatient   Does not apply q1800    Assessment: Patient arrives x SOB and chest tightness, found to have bilateral pulmonary emboli w/ R heart strain. EKG showing sinus tach, HTN w/ systolic 123456 and HR [id number] - 120's. No anticoagulation PTA. Baseline CBC WNL w/ Hgb slightly above normal range, aPTT/INR pending. Patient is being started on heparin drip for anticoagulation of bilateral pulmonary emboli.  Current Heparin rate 1200 units/hr  12/21 1001 HL  0.23 - subtherapeutic 12/21 1719 HL 0.23 - subtherapeutic 12/21 2347 HL 0.23 - subtherapeutic  Goal of Therapy:  Heparin level 0.3-0.7 units/ml Monitor platelets by anticoagulation protocol: Yes   Plan:  Will re-bolus heparin 1500 units IV x 1 Will increase rate to 1600 units/hr. Will recheck anti-Xa level in 6 hours  Will monitor daily CBC's and adjust per anti-Xa levels.  Warfarin: Initial INR 1.2; Goal INR 2 - 3 Patient received warfarin 5 mg PO x 1. Will schedule dose of warfarin 7.5 mg daily and monitor INR trends. Will continue heparin bridge until patient achieves two therapeutic INRs and is overlapped for at least 5 days of bridge therapy.  Ena Dawley, PharmD Clinical Pharmacist 03/04/2019 1:12 AM

## 2019-03-04 NOTE — Progress Notes (Signed)
[place] for heparin bridge and start of warfarin  Indication: pulmonary embolus  Allergies  Allergen Reactions  . Penicillins     Patient Measurements: Height: 5\' 3"  (160 cm) Weight: 185 lb 14.4 oz (84.3 kg) IBW/kg (Calculated) : 56.9 Heparin Dosing Weight: 66.8 kg  Vital Signs: Temp: 98.8 F (37.1 C) (12/22 1955) Temp Source: Oral (12/22 1955) BP: 154/91 (12/22 1955) Pulse Rate: 94 (12/22 1955)  Labs: Recent Labs    03/03/19 0012 03/03/19 0012 03/03/19 0216 03/03/19 1001 03/04/19 0520 03/04/19 0734 03/04/19 1400 03/04/19 2129  HGB 17.1*  --   --  15.5 15.5  --   --   --   HCT 49.6  --   --  44.6 44.0  --   --   --   PLT 281  --   --  241 244  --   --   --   APTT  --   --  >160*  --   --   --   --   --   LABPROT  --   --  15.1  --  15.2  --   --   --   INR  --   --  1.2  --  1.2  --   --   --   HEPARINUNFRC  --    < >  --  0.23*  --  0.42 <0.10* 0.12*  CREATININE 1.05  --   --  0.92 0.88  --   --   --   TROPONINIHS 7  --  7  --   --   --   --   --    < > = values in this interval not displayed.    Estimated Creatinine Clearance: 92.2 mL/min (by C-G formula based on SCr of 0.88 mg/dL).   Medical History: Past Medical History:  Diagnosis Date  . CAD (coronary artery disease) 2012   [facility] No heart cath, treated medically [facility]  . History of stroke   . Hyperlipemia   . Hypertension   . PE (pulmonary thromboembolism) (HCC)     Medications:  Scheduled:  . clopidogrel  75 mg Oral Daily  . heparin  2,200 Units Intravenous Once  . lisinopril  40 mg Oral Daily  . metoprolol tartrate  50 mg Oral BID  . omega-3 acid ethyl esters  1 g Oral Daily  . pravastatin  40 mg Oral Daily  . Warfarin - Pharmacist Dosing Inpatient   Does not apply q1800    Assessment: Patient arrives x SOB and chest tightness, found to have bilateral pulmonary emboli w/ R heart strain. EKG showing sinus tach, HTN w/ systolic [id number] and HR [id number] -  120's. No anticoagulation PTA. Baseline CBC WNL w/ Hgb slightly above normal range, aPTT/INR pending. Patient is being started on heparin drip for anticoagulation of bilateral pulmonary emboli.  Current Heparin rate 1200 units/hr  12/21 1001 HL 0.23 - subtherapeutic 12/21 1719 HL 0.23 - subtherapeutic 12/21 2347 HL 0.23 - subtherapeutic 12/22 0734 HL 0.42 - therapeutic 12/22 1400 HL < 0.1 - heparin was stopped at 1315 and restarted around 1515.   Date INR Warfarin Dose  12/21 1.2 5 mg  12/22 1.2 7.5 mg    Goal of Therapy:  Heparin level 0.3-0.7 units/ml Monitor platelets by anticoagulation protocol: Yes   Plan:  Heparin was stopped at 1315 and restarted around 1515. Will continue current rate at 1600 units/hr. Will recheck anti-Xa  level in 6 hours. Daily CBC while on heparin. CBC stable.  Warfarin: Day 2 out of 5 bridge with heparin. INR subtherapeutic at 1.2. Goal INR 2 - 3. Pt is 53 yrs with a BMI of 32. Pt started on levofloxacin, which can increase INR. Will give 10 mg x 1 tonight to help get INR therapeutic and adjust depending on the trend of INR. Daily INR ordered. Pt started on levofloxacin  Will continue heparin bridge until patient achieves two therapeutic INRs and is overlapped for at least 5 days of bridge therapy.  12/22: HL @ 2129 Will order heparin 2200 units IV X 1 bolus and increase drip rate to 1900 units/hr. Will recheck HL 6 hrs after rate change.   Orene Desanctis, PharmD Clinical Pharmacist 03/04/2019 10:20 PM

## 2019-03-04 NOTE — Progress Notes (Addendum)
PROGRESS NOTE    NAEL EISLER  [id number] DOB: [birth year] DOA: 03/03/2019 PCP: [facility], [name]       Assessment & Plan:   Active Problems:   Acute pulmonary embolism (HCC)   DNR (do not resuscitate)  Acute extensive bilateral pulmonary emboli: w/ subsequent right ventricular strain as per CTA chest. Will continue IV heparin drip and coumadin. INR goal therapeutic range 2-3 & currently 1.2. Continue on tele. Echo does not show any right strain as per cardio. Cardio recs apprec.  B/l LE US shows positive for DVT on the right. Pt does not meet requirements for thrombectomy of DVT as per IR. Will need a hypercoagulable work-up in approx 3 months as this is the pt's second blood clot, previous blood clot approx 1-1.5 year ago. Hypercoagulable workup as per hematology as an outpatient. Pt's PCP took him off of coumadin after 6 months and put the pt on plavix for unknown reason. Pt has sedentary lifestyle.  Pulmonary infarct vs pneumonia: as per CTA chest. Likely secondary to pulmonary infarct as the pt has extensive B/l PE but pt has a cough, elevated WBC, & temp was 100 overnight so will start levaquin.   B/l pulmonary nodules: stable as per CTA chest. Etiology unclear  Leukocytosis: reactive vs infection. Will continue to monitor.   Coronary artery disease: will continue his Plavix, beta-blocker   HLD: continue on statin  Hypokalemia: WNL today. Will continue to monitor   Hypertension: continue lopressor, lisinopril  Depression: severity unknown. Continue on home dose of  Wellbutrin XL.    DVT prophylaxis: IV heparin drip & coumadin Code Status: DNR Family Communication:  Disposition Plan:    Consultants:   cardio   Procedures: n/a   Antimicrobials: levaquin   Subjective: Pt c/o shortness of breath but improved from day prior   Objective: Vitals:   03/03/19 0956 03/03/19 1000 03/03/19 2029 03/04/19 0542  BP: (!) 147/95 (!) 141/95 (!) 154/94 (!)  163/97  Pulse:  83 91 81  Resp:   17 20  Temp:   100 F (37.8 C) 98.2 F (36.8 C)  TempSrc:   Oral Oral  SpO2:  96% 96% 93%  Weight:    84.3 kg  Height:        Intake/Output Summary (Last 24 hours) at 03/04/2019 0759 Last data filed at 03/04/2019 0547 Gross per 24 hour  Intake 2748.09 ml  Output 1150 ml  Net 1598.09 ml   Filed Weights   03/03/19 0004 03/04/19 0542  Weight: 81.6 kg 84.3 kg    Examination:  General exam: Appears calm and comfortable  Respiratory system: decreased breath sounds b/l. No wheezes Cardiovascular system: S1 & S2 +. No , rubs, gallops or clicks.  Gastrointestinal system: Abdomen is nondistended, soft and nontender.  Normal bowel sounds heard. Central nervous system: Alert and oriented. Moves all 4 extremities  Psychiatry: Judgement and insight appear normal. Mood & affect appropriate.     Data Reviewed: I have personally reviewed following labs and imaging studies  CBC: Recent Labs  Lab 03/03/19 0012 03/03/19 1001 03/04/19 0520  WBC 14.9* 11.9* 12.3*  NEUTROABS 10.2*  --   --   HGB 17.1* 15.5 15.5  HCT 49.6 44.6 44.0  MCV 87.9 87.8 86.1  PLT 281 241 [id number]   Basic Metabolic Panel: Recent Labs  Lab 03/03/19 0012 03/03/19 1001 03/04/19 0520  NA 137 134* 134*  K 3.4* 3.9 3.9  CL 102 101 104  CO2 24 23 20*  GLUCOSE 102* 93 96  BUN 12 11 13   CREATININE 1.05 0.92 0.88  CALCIUM 9.5 8.7* 8.5*  MG  --  2.1  --    GFR: Estimated Creatinine Clearance: 92.2 mL/min (by C-G formula based on SCr of 0.88 mg/dL). Liver Function Tests: Recent Labs  Lab 03/03/19 0012  AST 12*  ALT 12  ALKPHOS 104  BILITOT 0.7  PROT 7.9  ALBUMIN 4.1   No results for input(s): LIPASE, AMYLASE in the last 168 hours. No results for input(s): AMMONIA in the last 168 hours. Coagulation Profile: Recent Labs  Lab 03/03/19 0216 03/04/19 0520  INR 1.2 1.2   Cardiac Enzymes: No results for input(s): CKTOTAL, CKMB, CKMBINDEX, TROPONINI in the last 168  hours. BNP (last 3 results) No results for input(s): PROBNP in the last 8760 hours. HbA1C: No results for input(s): HGBA1C in the last 72 hours. CBG: No results for input(s): GLUCAP in the last 168 hours. Lipid Profile: No results for input(s): CHOL, HDL, LDLCALC, TRIG, CHOLHDL, LDLDIRECT in the last 72 hours. Thyroid Function Tests: No results for input(s): TSH, T4TOTAL, FREET4, T3FREE, THYROIDAB in the last 72 hours. Anemia Panel: No results for input(s): VITAMINB12, FOLATE, FERRITIN, TIBC, IRON, RETICCTPCT in the last 72 hours. Sepsis Labs: No results for input(s): PROCALCITON, LATICACIDVEN in the last 168 hours.  Recent Results (from the past 240 hour(s))  SARS CORONAVIRUS 2 (TAT 6-24 HRS) Nasopharyngeal Nasopharyngeal Swab     Status: None   Collection Time: 03/03/19  1:53 AM   Specimen: Nasopharyngeal Swab  Result Value Ref Range Status   SARS Coronavirus 2 NEGATIVE NEGATIVE Final    Comment: (NOTE) SARS-CoV-2 target nucleic acids are NOT DETECTED. The SARS-CoV-2 RNA is generally detectable in upper and lower respiratory specimens during the acute phase of infection. Negative results do not preclude SARS-CoV-2 infection, do not rule out co-infections with other pathogens, and should not be used as the sole basis for treatment or other patient management decisions. Negative results must be combined with clinical observations, patient history, and epidemiological information. The expected result is Negative. Fact Sheet for Patients: SugarRoll.be Fact Sheet for Healthcare Providers: https://www.woods-mathews.com/ This test is not yet approved or cleared by the [place] FDA and  has been authorized for detection and/or diagnosis of SARS-CoV-2 by FDA under an Emergency Use Authorization (EUA). This EUA will remain  in effect (meaning this test can be used) for the duration of the COVID-19 declaration under Section 56 4(b)(1) of the  Act, 21 U.S.C. section 360bbb-3(b)(1), unless the authorization is terminated or revoked sooner. Performed at [facility], [place] [address], [place], [place] [address]          Radiology Studies: DG Chest 1 View  Result Date: 03/03/2019 CLINICAL DATA:  Shortness of breath. Tachycardia. EXAM: CHEST  1 VIEW COMPARISON:  Radiograph 11/07/2017. CT 06/28/2017 FINDINGS: Low lung volumes. Normal heart size with unchanged mediastinal CT contours. Streaky bibasilar opacities, improved on the right and worsened on the left from prior exam. No pneumothorax or large pleural effusion. No pulmonary edema. No acute osseous abnormalities are seen. IMPRESSION: Streaky bibasilar opacities. These favor atelectasis or scarring, and are improved from 2019 radiograph on the right, but worsened on the left. Electronically Signed   By: Keith Rake M.D.   On: 03/03/2019 00:41   CT Angio Chest PE W/Cm &/Or Wo Cm  Result Date: 03/03/2019 CLINICAL DATA:  Shortness of breath. EXAM: CT ANGIOGRAPHY CHEST WITH CONTRAST TECHNIQUE: Multidetector CT imaging of  the chest was performed using the standard protocol during bolus administration of intravenous contrast. Multiplanar CT image reconstructions and MIPs were obtained to evaluate the vascular anatomy. CONTRAST:  60mL OMNIPAQUE IOHEXOL 350 MG/ML SOLN COMPARISON:  June 28, 2017. FINDINGS: Cardiovascular: Contrast injection is sufficient to demonstrate satisfactory opacification of the pulmonary arteries to the segmental level.There are acute bilateral pulmonary emboli involving the main right and left pulmonary arteries extending into the bilateral lobar branches primarily within the lower lobes. There is scattered segmental and subsegmental pulmonary emboli involving both the lower and upper lobes. The heart size is normal. There is CT evidence for right heart strain with the RV LV ratio measuring approximately 1.1. Coronary artery calcifications are noted. Thoracic  aortic calcifications are noted. Mediastinum/Nodes: --No mediastinal or hilar lymphadenopathy. --No axillary lymphadenopathy. --No supraclavicular lymphadenopathy. --Normal thyroid gland. --The esophagus is unremarkable Lungs/Pleura: There are emphysematous changes bilaterally. There is a stable 8 mm pulmonary nodule in the right lower lobe. There are additional scattered small bilateral pulmonary nodules which appear to be stable from prior study. There is atelectasis at the lung bases. There is a developing airspace opacity at the left lung base. This may represent an infiltrate or evolving pulmonary infarct in the setting of acute pulmonary emboli. The trachea is unremarkable. There is no pneumothorax. There is a small left-sided pleural effusion. Upper Abdomen: No acute abnormality. Musculoskeletal: No chest wall abnormality. No acute or significant osseous findings. There appear to be bilateral cervical ribs, a normal variant. Review of the MIP images confirms the above findings. IMPRESSION: 1. Extensive bilateral pulmonary emboli as detailed above with evidence for right heart strain with an RV/LV ratio measuring 1.1. 2. There is a developing airspace opacity at the left lung base. This may represent an infiltrate or evolving pulmonary infarct in the setting of acute pulmonary emboli. 3. Small left-sided pleural effusion. 4. Stable bilateral pulmonary nodules. Aortic Atherosclerosis (ICD10-I70.0) and Emphysema (ICD10-J43.9). These results were called by telephone at the time of interpretation on 03/03/2019 at 1:53 am to provider [facility] , who verbally acknowledged these results. Electronically Signed   By: Constance Holster M.D.   On: 03/03/2019 01:55   CT ABDOMEN PELVIS W CONTRAST  Result Date: 03/03/2019 CLINICAL DATA:  Evaluate for IVC. Patient has history of deep venous thrombosis. EXAM: CT ABDOMEN AND PELVIS WITH CONTRAST TECHNIQUE: Multidetector CT imaging of the abdomen and pelvis was  performed using the standard protocol following bolus administration of intravenous contrast. CONTRAST:  137mL OMNIPAQUE IOHEXOL 350 MG/ML SOLN COMPARISON:  None. FINDINGS: Lower chest: Bilateral pulmonary emboli are not well visualized on the current examination and are better delineated on CT a chest performed earlier same day. Bibasilar airspace disease, left greater than right, which may reflect atelectasis versus developing pulmonary infarct. Hepatobiliary: No focal liver abnormality is seen. No gallstones, gallbladder wall thickening, or biliary dilatation. Pancreas: Unremarkable. No pancreatic ductal dilatation or surrounding inflammatory changes. Spleen: Normal in size without focal abnormality. Adrenals/Urinary Tract: Adrenal glands are unremarkable. Kidneys are normal, without renal calculi, focal lesion, or hydronephrosis. Bladder is unremarkable. Stomach/Bowel: Stomach is within normal limits. Appendix appears normal. No evidence of bowel wall thickening, distention, or inflammatory changes. Vascular/Lymphatic: Abdominal aortic atherosclerosis. Normal caliber abdominal aorta. Common femoral vein, internal and external iliac veins and IVC are patent without venous thrombosis. No lymphadenopathy. Reproductive: Prostate is unremarkable. Other: No abdominal wall hernia or abnormality. No abdominopelvic ascites. Musculoskeletal: No acute osseous abnormality. No aggressive osseous lesion. Degenerative disease with disc height  loss at L5-S1. Chronic L1 vertebral body compression fracture. IMPRESSION: 1. Common femoral vein, internal and external iliac veins and IVC are patent without venous thrombosis. 2. Bilateral pulmonary emboli are not well visualized on the current examination and are better delineated on CT a chest performed earlier same day. Bibasilar airspace disease, left greater than right, which may reflect atelectasis versus developing pulmonary infarct. Electronically Signed   By: Kathreen Devoid    On: 03/03/2019 09:55   US Venous Img Lower Bilateral (DVT)  Result Date: 03/03/2019 CLINICAL DATA:  Pulmonary embolism EXAM: BILATERAL LOWER EXTREMITY VENOUS DOPPLER ULTRASOUND TECHNIQUE: Gray-scale sonography with graded compression, as well as color Doppler and duplex ultrasound were performed to evaluate the lower extremity deep venous systems from the level of the common femoral vein and including the common femoral, femoral, profunda femoral, popliteal and calf veins including the posterior tibial, peroneal and gastrocnemius veins when visible. The superficial great saphenous vein was also interrogated. Spectral Doppler was utilized to evaluate flow at rest and with distal augmentation maneuvers in the common femoral, femoral and popliteal veins. COMPARISON:  Left leg ultrasound 06/12/2017 FINDINGS: RIGHT LOWER EXTREMITY Expansile mid level echoes within the right popliteal artery extending into the posterior tibial and 1 of the peroneal veins. Appearance is consistent with recent thrombosis. There is rouleoux formation and relatively blunted respiratory phasicity. LEFT LOWER EXTREMITY Negative for DVT. There is rouleoux formation and relatively blunted respiratory phasicity. IMPRESSION: 1. Positive for DVT on the right with occlusive clot seen in the popliteal, posterior tibial, and peroneal veins. 2. Bilateral slow venous flow and blunted respiratory phasicity, suggest enhanced abdominal imaging to exclude iliac or IVC pathology. Electronically Signed   By: Monte Fantasia M.D.   On: 03/03/2019 05:14   ECHOCARDIOGRAM COMPLETE  Result Date: 03/03/2019   ECHOCARDIOGRAM REPORT   Patient Name:   ZAKERY DAFFIN Date of Exam: 03/03/2019 Medical Rec #:  [id number]        Height:       63.0 in Accession #:    [id number]       Weight:       180.0 lb Date of Birth:  01/11/1966        BSA:          1.85 m Patient Age:    53 years         BP:           141/95 mmHg Patient Gender: M                HR:            83 bpm. Exam Location:  [facility] Procedure: 2D Echo, Cardiac Doppler and Color Doppler Indications:     Pulmonary embolus 415.19  History:         Patient has no prior history of Echocardiogram examinations.                  CAD; Risk Factors:Hypertension. History of stroke, PE.  Sonographer:     Sherrie Sport RDCS (AE) Referring Phys:  [id number] Arvella Merles MANSY Diagnosing Phys: Kathlyn Sacramento MD IMPRESSIONS  1. Left ventricular ejection fraction, by visual estimation, is 55 to 60%. The left ventricle has normal function. There is no left ventricular hypertrophy.  2. The left ventricle has no regional wall motion abnormalities.  3. Global right ventricle has normal systolic function.The right ventricular size is normal. No increase in right ventricular wall thickness.  4. Left atrial size was normal.  5. Right atrial size was normal.  6. The mitral valve is normal in structure. No evidence of mitral valve regurgitation. No evidence of mitral stenosis.  7. The tricuspid valve is normal in structure. Tricuspid valve regurgitation is trivial.  8. The aortic valve is normal in structure. Aortic valve regurgitation is not visualized. No evidence of aortic valve sclerosis or stenosis.  9. The pulmonic valve was normal in structure. Pulmonic valve regurgitation is not visualized. 10. Normal pulmonary artery systolic pressure. FINDINGS  Left Ventricle: Left ventricular ejection fraction, by visual estimation, is 55 to 60%. The left ventricle has normal function. The left ventricle has no regional wall motion abnormalities. There is no left ventricular hypertrophy. Left ventricular diastolic parameters were normal. Normal left atrial pressure. Right Ventricle: The right ventricular size is normal. No increase in right ventricular wall thickness. Global RV systolic function is has normal systolic function. The tricuspid regurgitant velocity is 2.27 m/s, and with an assumed right atrial pressure  of 5 mmHg, the estimated right ventricular  systolic pressure is normal at 25.6 mmHg. Left Atrium: Left atrial size was normal in size. Right Atrium: Right atrial size was normal in size Pericardium: There is no evidence of pericardial effusion. Mitral Valve: The mitral valve is normal in structure. No evidence of mitral valve regurgitation. No evidence of mitral valve stenosis by observation. Tricuspid Valve: The tricuspid valve is normal in structure. Tricuspid valve regurgitation is trivial. Aortic Valve: The aortic valve is normal in structure. Aortic valve regurgitation is not visualized. The aortic valve is structurally normal, with no evidence of sclerosis or stenosis. Aortic valve mean gradient measures 3.5 mmHg. Aortic valve peak gradient measures 6.0 mmHg. Aortic valve area, by VTI measures 3.89 cm. Pulmonic Valve: The pulmonic valve was normal in structure. Pulmonic valve regurgitation is not visualized. Pulmonic regurgitation is not visualized. Aorta: The aortic root, ascending aorta and aortic arch are all structurally normal, with no evidence of dilitation or obstruction. Venous: The inferior vena cava was not well visualized. IAS/Shunts: No atrial level shunt detected by color flow Doppler. There is no evidence of a patent foramen ovale. No ventricular septal defect is seen or detected. There is no evidence of an atrial septal defect.  LEFT VENTRICLE PLAX 2D LVIDd:         2.76 cm  Diastology LVIDs:         1.73 cm  LV e' lateral:   10.40 cm/s LV PW:         1.02 cm  LV E/e' lateral: 9.1 LV IVS:        0.80 cm  LV e' medial:    6.85 cm/s LVOT diam:     2.00 cm  LV E/e' medial:  13.8 LV SV:         20 ml LV SV Index:   10.20 LVOT Area:     3.14 cm  RIGHT VENTRICLE RV Basal diam:  2.96 cm RV S prime:     11.40 cm/s TAPSE (M-mode): 4.3 cm LEFT ATRIUM             Index       RIGHT ATRIUM           Index LA diam:        3.30 cm 1.78 cm/m  RA Area:     10.50 cm LA Vol (A2C):   40.1 ml 21.69 ml/m RA Volume:   19.70 ml  10.65 ml/m LA Vol (A4C):    38.2 ml  20.66 ml/m LA Biplane Vol: 40.0 ml 21.63 ml/m  AORTIC VALVE                   PULMONIC VALVE AV Area (Vmax):    2.51 cm    PV Vmax:        0.77 m/s AV Area (Vmean):   2.59 cm    PV Peak grad:   2.3 mmHg AV Area (VTI):     3.89 cm    RVOT Peak grad: 3 mmHg AV Vmax:           122.00 cm/s AV Vmean:          87.600 cm/s AV VTI:            0.214 m AV Peak Grad:      6.0 mmHg AV Mean Grad:      3.5 mmHg LVOT Vmax:         97.50 cm/s LVOT Vmean:        72.200 cm/s LVOT VTI:          0.265 m LVOT/AV VTI ratio: 1.24  AORTA Ao Root diam: 2.70 cm MITRAL VALVE                        TRICUSPID VALVE MV Area (PHT): 3.68 cm             TR Peak grad:   20.6 mmHg MV PHT:        59.74 msec           TR Vmax:        227.00 cm/s MV Decel Time: 206 msec MV E velocity: 94.60 cm/s 103 cm/s  SHUNTS MV A velocity: 75.70 cm/s 70.3 cm/s Systemic VTI:  0.26 m MV E/A ratio:  1.25       1.5       Systemic Diam: 2.00 cm  Kathlyn Sacramento MD Electronically signed by Kathlyn Sacramento MD Signature Date/Time: 03/03/2019/1:44:51 PM    Final         Scheduled Meds: . clopidogrel  75 mg Oral Daily  . lisinopril  40 mg Oral Daily  . metoprolol tartrate  50 mg Oral BID  . omega-3 acid ethyl esters  1 g Oral Daily  . pravastatin  40 mg Oral Daily  . warfarin  10 mg Oral ONCE-1800  . Warfarin - Pharmacist Dosing Inpatient   Does not apply q1800   Continuous Infusions: . sodium chloride 100 mL/hr at 03/04/19 0045  . heparin 1,600 Units/hr (03/04/19 0119)     LOS: 1 day    Time spent: 32 mins    Wyvonnia Dusky, MD [facility] Pager [phone]  If 7PM-7AM, please contact night-coverage [email or web address] Password [facility] 03/04/2019, 7:59 AM

## 2019-03-05 ENCOUNTER — Inpatient Hospital Stay: Payer: Medicare HMO

## 2019-03-05 LAB — BASIC METABOLIC PANEL
Anion gap: 7 (ref 5–15)
BUN: 14 mg/dL (ref 6–20)
CO2: 21 mmol/L — ABNORMAL LOW (ref 22–32)
Calcium: 8.6 mg/dL — ABNORMAL LOW (ref 8.9–10.3)
Chloride: 106 mmol/L (ref 98–111)
Creatinine, Ser: 0.91 mg/dL (ref 0.61–1.24)
GFR calc Af Amer: >60 mL/min (ref >60)
GFR calc non Af Amer: >60 mL/min (ref >60)
Glucose, Bld: 98 mg/dL (ref 70–99)
Potassium: 3.6 mmol/L (ref 3.5–5.1)
Sodium: 134 mmol/L — ABNORMAL LOW (ref 135–145)

## 2019-03-05 LAB — CBC
HCT: 41.4 % (ref 39.0–52.0)
Hemoglobin: 14.2 g/dL (ref 13.0–17.0)
MCH: 30.1 pg (ref 26.0–34.0)
MCHC: 34.3 g/dL (ref 30.0–36.0)
MCV: 87.9 fL (ref 80.0–100.0)
Platelets: 245 10*3/uL (ref 150–400)
RBC: 4.71 MIL/uL (ref 4.22–5.81)
RDW: 12.8 % (ref 11.5–15.5)
WBC: 11.7 10*3/uL — ABNORMAL HIGH (ref 4.0–10.5)
nRBC: 0 % (ref 0.0–0.2)

## 2019-03-05 LAB — PROTIME-INR
INR: 1.7 — ABNORMAL HIGH (ref 0.8–1.2)
Prothrombin Time: 20.3 seconds — ABNORMAL HIGH (ref 11.4–15.2)

## 2019-03-05 LAB — HEPARIN LEVEL (UNFRACTIONATED)
Heparin Unfractionated: 0.47 IU/mL (ref 0.30–0.70)
Heparin Unfractionated: 0.28 IU/mL — ABNORMAL LOW (ref 0.30–0.70)
Heparin Unfractionated: 0.45 IU/mL (ref 0.30–0.70)

## 2019-03-05 MED ORDER — WARFARIN SODIUM 7.5 MG PO TABS
7.5000 mg | ORAL_TABLET | Freq: Once | ORAL | Status: AC
  Administered 2019-03-05: 7.5 mg via ORAL
  Filled 2019-03-05: qty 1

## 2019-03-05 MED ORDER — SODIUM BICARBONATE 8.4 % IV SOLN
INTRAVENOUS | Status: AC
  Filled 2019-03-05: qty 50

## 2019-03-05 MED ORDER — LEVOFLOXACIN 500 MG PO TABS
500.0000 mg | ORAL_TABLET | Freq: Every day | ORAL | Status: DC
  Administered 2019-03-05 – 2019-03-07 (×3): 500 mg via ORAL
  Filled 2019-03-05 (×3): qty 1

## 2019-03-05 NOTE — Progress Notes (Signed)
PROGRESS NOTE    Scott Foley  [id number] DOB: Jul 02, 1965 DOA: 03/03/2019 PCP: [facility], [name]    Brief Narrative:  Scott Foley  is a 53 y.o. Caucasian male with a known history of coronary artery disease, hypertension dyslipidemia, who presented to emergency room with onset of dyspnea and dry cough for the last day with associated dyspnea on exertion.Upon presentation to the emergency room, blood pressure was 171/121 with a pulse of 122 respiratory to 21 pulse 70 was 96% on room air with temperature of 98.8 and later 99.2.  Labs were remarkable for mild hypokalemia of 3.4 and leukocytosis of 14.9 with hemoconcentration and neutrophilia.  BNP was only 22 and high-sensitivity troponin I was 7.  EKG showed sinus tachycardia with a rate of 122 with Q waves inferiorly and repeat EKG showed sinus tachycardia with a rate of 116 with left axis deviation.  Portable chest x-ray showed streaky bibasilar opacities likely atelectasis or scarring. Chest CT angiogram revealed extensive bilateral pulmonary emboli     Consultants:   none  Procedures:  Venous US: 1. Positive for DVT on the right with occlusive clot seen in the popliteal, posterior tibial, and peroneal veins. 2. Bilateral slow venous flow and blunted respiratory phasicity, suggest enhanced abdominal imaging to exclude iliac or IVC Pathology.  CT abd/pelv 1. Common femoral vein, internal and external iliac veins and IVC are patent without venous thrombosis. 2. Bilateral pulmonary emboli are not well visualized on the current examination and are better delineated on CT a chest performed earlier same day. Bibasilar airspace disease, left greater than right, which may reflect atelectasis versus developing pulmonary infarct.   CtA chest 1. Extensive bilateral pulmonary emboli as detailed above with evidence for right heart strain with an RV/LV ratio measuring 1.1. 2. There is a developing airspace opacity at the left  lung base. This may represent an infiltrate or evolving pulmonary infarct in the setting of acute pulmonary emboli. 3. Small left-sided pleural effusion. 4. Stable bilateral pulmonary nodules.  Aortic Atherosclerosis (ICD10-I70.0) and Emphysema (ICD10-J43.9).  Antimicrobials:  levaquin   Subjective: No sob or cp. No complaints.  Objective: Vitals:   03/04/19 1534 03/04/19 1955 03/05/19 0337 03/05/19 0730  BP: 139/81 (!) 154/91 131/90 (!) 113/56  Pulse: 87 94 84 76  Resp: 19 18 16 18   Temp: 98.2 F (36.8 C) 98.8 F (37.1 C) 98.5 F (36.9 C) 98.3 F (36.8 C)  TempSrc:  Oral Oral Oral  SpO2: 94% 94% 94% 94%  Weight:      Height:        Intake/Output Summary (Last 24 hours) at 03/05/2019 1447 Last data filed at 03/05/2019 1300 Gross per 24 hour  Intake 480 ml  Output 750 ml  Net -270 ml   Filed Weights   03/03/19 0004 03/04/19 0542  Weight: 81.6 kg 84.3 kg    Examination:  General exam: Appears calm and comfortable  Respiratory system: Clear to auscultation. Respiratory effort normal. Cardiovascular system: S1 & S2 heard, RRR. No JVD, murmurs, rubs, gallops or clicks.  Gastrointestinal system: Abdomen is nondistended, soft and nontender. Normal bowel sounds heard. Central nervous system: Alert and oriented. No focal neurological deficits. Extremities: no edema or calf tenderness Skin: warm and dry Psychiatry: Judgement and insight appear normal. Mood & affect appropriate.     Data Reviewed: I have personally reviewed following labs and imaging studies  CBC: Recent Labs  Lab 03/03/19 0012 03/03/19 1001 03/04/19 0520 03/05/19 0449  WBC 14.9* 11.9* 12.3* 11.7*  NEUTROABS 10.2*  --   --   --   HGB 17.1* 15.5 15.5 14.2  HCT 49.6 44.6 44.0 41.4  MCV 87.9 87.8 86.1 87.9  PLT 281 241 244 [id number]   Basic Metabolic Panel: Recent Labs  Lab 03/03/19 0012 03/03/19 1001 03/04/19 0520 03/05/19 0449  NA 137 134* 134* 134*  K 3.4* 3.9 3.9 3.6  CL 102 101 104  106  CO2 24 23 20* 21*  GLUCOSE 102* 93 96 98  BUN 12 11 13 14   CREATININE 1.05 0.92 0.88 0.91  CALCIUM 9.5 8.7* 8.5* 8.6*  MG  --  2.1  --   --    GFR: Estimated Creatinine Clearance: 89.1 mL/min (by C-G formula based on SCr of 0.91 mg/dL). Liver Function Tests: Recent Labs  Lab 03/03/19 0012  AST 12*  ALT 12  ALKPHOS 104  BILITOT 0.7  PROT 7.9  ALBUMIN 4.1   No results for input(s): LIPASE, AMYLASE in the last 168 hours. No results for input(s): AMMONIA in the last 168 hours. Coagulation Profile: Recent Labs  Lab 03/03/19 0216 03/04/19 0520 03/05/19 0449  INR 1.2 1.2 1.7*   Cardiac Enzymes: No results for input(s): CKTOTAL, CKMB, CKMBINDEX, TROPONINI in the last 168 hours. BNP (last 3 results) No results for input(s): PROBNP in the last 8760 hours. HbA1C: No results for input(s): HGBA1C in the last 72 hours. CBG: No results for input(s): GLUCAP in the last 168 hours. Lipid Profile: No results for input(s): CHOL, HDL, LDLCALC, TRIG, CHOLHDL, LDLDIRECT in the last 72 hours. Thyroid Function Tests: No results for input(s): TSH, T4TOTAL, FREET4, T3FREE, THYROIDAB in the last 72 hours. Anemia Panel: No results for input(s): VITAMINB12, FOLATE, FERRITIN, TIBC, IRON, RETICCTPCT in the last 72 hours. Sepsis Labs: No results for input(s): PROCALCITON, LATICACIDVEN in the last 168 hours.  Recent Results (from the past 240 hour(s))  SARS CORONAVIRUS 2 (TAT 6-24 HRS) Nasopharyngeal Nasopharyngeal Swab     Status: None   Collection Time: 03/03/19  1:53 AM   Specimen: Nasopharyngeal Swab  Result Value Ref Range Status   SARS Coronavirus 2 NEGATIVE NEGATIVE Final    Comment: (NOTE) SARS-CoV-2 target nucleic acids are NOT DETECTED. The SARS-CoV-2 RNA is generally detectable in upper and lower respiratory specimens during the acute phase of infection. Negative results do not preclude SARS-CoV-2 infection, do not rule out co-infections with other pathogens, and should not  be used as the sole basis for treatment or other patient management decisions. Negative results must be combined with clinical observations, patient history, and epidemiological information. The expected result is Negative. Fact Sheet for Patients: SugarRoll.be Fact Sheet for Healthcare Providers: https://www.woods-mathews.com/ This test is not yet approved or cleared by the [place] FDA and  has been authorized for detection and/or diagnosis of SARS-CoV-2 by FDA under an Emergency Use Authorization (EUA). This EUA will remain  in effect (meaning this test can be used) for the duration of the COVID-19 declaration under Section 56 4(b)(1) of the Act, 21 U.S.C. section 360bbb-3(b)(1), unless the authorization is terminated or revoked sooner. Performed at [facility], [place] [address], [place], [place] [address]          Radiology Studies: DG Chest Port 1 View  Result Date: 03/05/2019 CLINICAL DATA:  Shortness of breath EXAM: PORTABLE CHEST 1 VIEW COMPARISON:  March 03, 2019 FINDINGS: There is bibasilar atelectasis. There is a small left pleural effusion. Heart size and pulmonary vascularity are normal. No adenopathy. No bone lesions.  IMPRESSION: Apparent bibasilar atelectasis a small left pleural effusion. No frank consolidation. Cardiac silhouette within normal limits. No adenopathy. Electronically Signed   By: Lowella Grip III M.D.   On: 03/05/2019 07:57        Scheduled Meds: . clopidogrel  75 mg Oral Daily  . lisinopril  40 mg Oral Daily  . metoprolol tartrate  50 mg Oral BID  . omega-3 acid ethyl esters  1 g Oral Daily  . pravastatin  40 mg Oral Daily  . warfarin  7.5 mg Oral ONCE-1800  . Warfarin - Pharmacist Dosing Inpatient   Does not apply q1800   Continuous Infusions: . heparin 2,000 Units/hr (03/05/19 1248)  . levofloxacin (LEVAQUIN) IV 750 mg (03/04/19 1506)    Assessment & Plan:   Active  Problems:   Acute pulmonary embolism ([place])   DNR (do not resuscitate)   1.Acute extensive bilateral pulmonary emboli: On IV heparin drip and Coumadin.  INR not therapeutic.  Goal INR 2-3  Continue telemetry  Echo does not show any right strain as per cardio.  IVC patent.   Will need a hypercoagulable work-up in approx 3 months as this is the pt's second blood clot, previous blood clot approx 1-1.5 year ago.  Hypercoagulable workup as per hematology as an outpatient, will need to set up as pt tells me he does not have one . Pt's PCP took him off of coumadin after 6 months and put the pt on plavix for unknown reason.  Discussed with patient about risk and complications of anticoagulation including brain hemorrhage and GI bleed and he verbalizes an understanding.  Pulmonary infarct vs pneumonia: as per CTA chest. Likely secondary to pulmonary infarct as the pt has extensive B/l PE. Started on Levaquin IV will switch to p.o. for possible pneumonia  B/l pulmonary nodules: stable as per CTA chest. Fu as outpt monitoring  Leukocytosis: likely reactive. Will continue to monitor. No fevers.  Coronary artery disease: will continue his Plavix, beta-blocker , statin  HLD: continue on statin  Hypokalemia: KCl was repleted and stable  Hypertension: continue lopressor, lisinopril  Depression: severity unknown. Continue on home dose of  Wellbutrin XL.   DVT prophylaxis: heparin iv and coumadin Code Status:DNR/DNI Family Communication: none at bedside Disposition Plan: Discharge when INR therapeutic and medically stable       LOS: 2 days   Time spent: 45 minutes with more than 50% COC    Nolberto Hanlon, MD [facility] Pager [phone]  If 7PM-7AM, please contact night-coverage [email or web address] Password [facility] 03/05/2019, 2:47 PM

## 2019-03-05 NOTE — Progress Notes (Signed)
[place] for heparin bridge and start of warfarin  Indication: pulmonary embolus  Allergies  Allergen Reactions  . Penicillins     Patient Measurements: Height: 5\' 3"  (160 cm) Weight: 185 lb 14.4 oz (84.3 kg) IBW/kg (Calculated) : 56.9 Heparin Dosing Weight: 66.8 kg  Vital Signs: Temp: 98.3 F (36.8 C) (12/23 0730) Temp Source: Oral (12/23 0730) BP: 113/56 (12/23 0730) Pulse Rate: 76 (12/23 0730)  Labs: Recent Labs    03/03/19 0012 03/03/19 0012 03/03/19 0216 03/03/19 1001 03/04/19 0520 03/04/19 1400 03/04/19 2129 03/05/19 0449  HGB 17.1*  --   --  15.5 15.5  --   --  14.2  HCT 49.6  --   --  44.6 44.0  --   --  41.4  PLT 281  --   --  241 244  --   --  245  APTT  --   --  >160*  --   --   --   --   --   LABPROT  --   --  15.1  --  15.2  --   --  20.3*  INR  --   --  1.2  --  1.2  --   --  1.7*  HEPARINUNFRC  --    < >  --  0.23*  --  <0.10* 0.12* 0.47  CREATININE 1.05  --   --  0.92 0.88  --   --  0.91  TROPONINIHS 7  --  7  --   --   --   --   --    < > = values in this interval not displayed.    Estimated Creatinine Clearance: 89.1 mL/min (by C-G formula based on SCr of 0.91 mg/dL).   Medical History: Past Medical History:  Diagnosis Date  . CAD (coronary artery disease) 2012   AMI No heart cath, treated medically [facility]  . History of stroke   . Hyperlipemia   . Hypertension   . PE (pulmonary thromboembolism) (HCC)     Medications:  Scheduled:  . clopidogrel  75 mg Oral Daily  . lisinopril  40 mg Oral Daily  . metoprolol tartrate  50 mg Oral BID  . omega-3 acid ethyl esters  1 g Oral Daily  . pravastatin  40 mg Oral Daily  . Warfarin - Pharmacist Dosing Inpatient   Does not apply q1800    Assessment: Patient arrives x SOB and chest tightness, found to have bilateral pulmonary emboli w/ R heart strain. EKG showing sinus tach, HTN w/ systolic 123456 and HR [id number] - 120's. No anticoagulation PTA. Baseline CBC  WNL w/ Hgb slightly above normal range, aPTT/INR pending. Patient is being started on heparin drip for anticoagulation of bilateral pulmonary emboli.  Current Heparin rate 1200 units/hr  12/21 1001 HL 0.23 - subtherapeutic 12/21 1719 HL 0.23 - subtherapeutic 12/21 2347 HL 0.23 - subtherapeutic 12/22 0734 HL 0.42 - therapeutic 12/22 1400 HL < 0.1 - heparin was stopped at 1315 and restarted around 1515.  12/22 2129 HL 0.12 - 2200 units IV X 1 bolus and increase drip rate to 1900 units/hr. 12/23 0449 HL 0.47 - therapeutic  12/23 1054 HL 0.28 - subtherapeutic will increase rate to 2000 units/hr   Date INR Warfarin Dose  12/21 1.2 5 mg + 7.5 mg  12/22 1.2 10 mg  12/23 1.7 7.5 mg     Goal of Therapy:  Heparin level 0.3-0.7 units/ml Monitor platelets  by anticoagulation protocol: Yes   Plan:  Heparin: Heparin level slightly subtherapeutic. Will increase rate to 2000 units/hr. Will recheck anti-Xa level in 6 hours. Daily CBC while on heparin. CBC stable.  Warfarin: Day 3 out of 5 bridge with heparin. INR subtherapeutic at 1.7. Goal INR 2 - 3. Pt is 53 yrs with a BMI of 32. Pt started on levofloxacin, which can increase INR. INR bump probably due to 12.5 mg given on 12/21. Predict INR will be therapeutic tomorrow. Will give 7.5 mg x 1 tonight and adjust depending on the trend of INR. Daily INR ordered.   Will continue heparin bridge until patient achieves two therapeutic INRs and is overlapped for at least 5 days of bridge therapy.  Oswald Hillock, PharmD, BCPS Clinical Pharmacist 03/05/2019 7:55 AM

## 2019-03-05 NOTE — Progress Notes (Signed)
[place] for heparin bridge and start of warfarin  Indication: pulmonary embolus  Allergies  Allergen Reactions  . Penicillins     Patient Measurements: Height: 5\' 3"  (160 cm) Weight: 185 lb 14.4 oz (84.3 kg) IBW/kg (Calculated) : 56.9 Heparin Dosing Weight: 66.8 kg  Vital Signs: Temp: 98.5 F (36.9 C) (12/23 0337) Temp Source: Oral (12/23 0337) BP: 131/90 (12/23 0337) Pulse Rate: 84 (12/23 0337)  Labs: Recent Labs    03/03/19 0012 03/03/19 0012 03/03/19 0216 03/03/19 1001 03/04/19 0520 03/04/19 1400 03/04/19 2129 03/05/19 0449  HGB 17.1*  --   --  15.5 15.5  --   --  14.2  HCT 49.6  --   --  44.6 44.0  --   --  41.4  PLT 281  --   --  241 244  --   --  245  APTT  --   --  >160*  --   --   --   --   --   LABPROT  --   --  15.1  --  15.2  --   --  20.3*  INR  --   --  1.2  --  1.2  --   --  1.7*  HEPARINUNFRC  --    < >  --  0.23*  --  <0.10* 0.12* 0.47  CREATININE 1.05  --   --  0.92 0.88  --   --  0.91  TROPONINIHS 7  --  7  --   --   --   --   --    < > = values in this interval not displayed.    Estimated Creatinine Clearance: 89.1 mL/min (by C-G formula based on SCr of 0.91 mg/dL).   Medical History: Past Medical History:  Diagnosis Date  . CAD (coronary artery disease) 2012   AMI No heart cath, treated medically [facility]  . History of stroke   . Hyperlipemia   . Hypertension   . PE (pulmonary thromboembolism) (HCC)     Medications:  Scheduled:  . clopidogrel  75 mg Oral Daily  . lisinopril  40 mg Oral Daily  . metoprolol tartrate  50 mg Oral BID  . omega-3 acid ethyl esters  1 g Oral Daily  . pravastatin  40 mg Oral Daily  . Warfarin - Pharmacist Dosing Inpatient   Does not apply q1800    Assessment: Patient arrives x SOB and chest tightness, found to have bilateral pulmonary emboli w/ R heart strain. EKG showing sinus tach, HTN w/ systolic 123456 and HR [id number] - 120's. No anticoagulation PTA. Baseline CBC  WNL w/ Hgb slightly above normal range, aPTT/INR pending. Patient is being started on heparin drip for anticoagulation of bilateral pulmonary emboli.  Current Heparin rate 1200 units/hr  12/21 1001 HL 0.23 - subtherapeutic 12/21 1719 HL 0.23 - subtherapeutic 12/21 2347 HL 0.23 - subtherapeutic 12/22 0734 HL 0.42 - therapeutic 12/22 1400 HL < 0.1 - heparin was stopped at 1315 and restarted around 1515.   Date INR Warfarin Dose  12/21 1.2 5 mg  12/22 1.2 7.5 mg    Goal of Therapy:  Heparin level 0.3-0.7 units/ml Monitor platelets by anticoagulation protocol: Yes   Plan:  Heparin was stopped at 1315 and restarted around 1515. Will continue current rate at 1600 units/hr. Will recheck anti-Xa level in 6 hours. Daily CBC while on heparin. CBC stable.  Warfarin: Day 2 out of 5 bridge with  heparin. INR subtherapeutic at 1.2. Goal INR 2 - 3. Pt is 53 yrs with a BMI of 32. Pt started on levofloxacin, which can increase INR. Will give 10 mg x 1 tonight to help get INR therapeutic and adjust depending on the trend of INR. Daily INR ordered. Pt started on levofloxacin  Will continue heparin bridge until patient achieves two therapeutic INRs and is overlapped for at least 5 days of bridge therapy.  12/22: HL @ 2129 0.12 12/23: HL @ 0449 = 0.47, therapeutic x 1 - CBC OK  Will continue Heparin at 1900 units/hr. Will recheck HL in 6 hrs to confirm.   Ena Dawley, PharmD Clinical Pharmacist 03/05/2019 6:37 AM

## 2019-03-05 NOTE — Progress Notes (Signed)
[place] for heparin bridge and start of warfarin  Indication: pulmonary embolus  Allergies  Allergen Reactions  . Penicillins     Patient Measurements: Height: 5\' 3"  (160 cm) Weight: 185 lb 14.4 oz (84.3 kg) IBW/kg (Calculated) : 56.9 Heparin Dosing Weight: 66.8 kg  Vital Signs: Temp: 98.5 F (36.9 C) (12/23 1616) Temp Source: Oral (12/23 1616) BP: 136/89 (12/23 1616) Pulse Rate: 72 (12/23 1616)  Labs: Recent Labs    03/03/19 0012 03/03/19 0012 03/03/19 0216 03/03/19 1001 03/04/19 0520 03/05/19 0449 03/05/19 1054 03/05/19 1753  HGB 17.1*  --   --  15.5 15.5 14.2  --   --   HCT 49.6  --   --  44.6 44.0 41.4  --   --   PLT 281  --   --  241 244 245  --   --   APTT  --   --  >160*  --   --   --   --   --   LABPROT  --   --  15.1  --  15.2 20.3*  --   --   INR  --   --  1.2  --  1.2 1.7*  --   --   HEPARINUNFRC  --    < >  --  0.23*  --  0.47 0.28* 0.45  CREATININE 1.05  --   --  0.92 0.88 0.91  --   --   TROPONINIHS 7  --  7  --   --   --   --   --    < > = values in this interval not displayed.    Estimated Creatinine Clearance: 89.1 mL/min (by C-G formula based on SCr of 0.91 mg/dL).   Medical History: Past Medical History:  Diagnosis Date  . CAD (coronary artery disease) 2012   AMI No heart cath, treated medically [facility]  . History of stroke   . Hyperlipemia   . Hypertension   . PE (pulmonary thromboembolism) (HCC)     Medications:  Scheduled:  . clopidogrel  75 mg Oral Daily  . levofloxacin  500 mg Oral Daily  . lisinopril  40 mg Oral Daily  . metoprolol tartrate  50 mg Oral BID  . omega-3 acid ethyl esters  1 g Oral Daily  . pravastatin  40 mg Oral Daily  . Warfarin - Pharmacist Dosing Inpatient   Does not apply q1800    Assessment: Patient arrives x SOB and chest tightness, found to have bilateral pulmonary emboli w/ R heart strain. EKG showing sinus tach, HTN w/ systolic 123456 and HR [id number] - 120's. No  anticoagulation PTA. Baseline CBC WNL w/ Hgb slightly above normal range, aPTT/INR pending. Patient is being started on heparin drip for anticoagulation of bilateral pulmonary emboli.  Current Heparin rate 1200 units/hr  12/21 1001 HL 0.23 - subtherapeutic 12/21 1719 HL 0.23 - subtherapeutic 12/21 2347 HL 0.23 - subtherapeutic 12/22 0734 HL 0.42 - therapeutic 12/22 1400 HL < 0.1 - heparin was stopped at 1315 and restarted around 1515.  12/22 2129 HL 0.12 - 2200 units IV X 1 bolus and increase drip rate to 1900 units/hr. 12/23 0449 HL 0.47 - therapeutic  12/23 1054 HL 0.28 - subtherapeutic will increase rate to 2000 units/hr 12/23 1753 HL 0.45 - therapeutic   Date INR Warfarin Dose  12/21 1.2 5 mg + 7.5 mg  12/22 1.2 10 mg  12/23 1.7 7.5 mg  Goal of Therapy:  Heparin level 0.3-0.7 units/ml Monitor platelets by anticoagulation protocol: Yes   Plan:  Heparin: Heparin level is currently therapeutic. Will continue rate of 2000 units/hr. Will check confirmatory anti-Xa level in 6 hours. Daily CBC while on heparin. CBC stable.  Warfarin: Day 3 out of 5 bridge with heparin. INR subtherapeutic at 1.7. Goal INR 2 - 3. Pt is 53 yrs with a BMI of 32. Pt started on levofloxacin, which can increase INR. INR bump probably due to 12.5 mg given on 12/21. Predict INR will be therapeutic tomorrow. Will give 7.5 mg x 1 tonight and adjust depending on the trend of INR. Daily INR ordered.   Will continue heparin bridge until patient achieves two therapeutic INRs and is overlapped for at least 5 days of bridge therapy.  Pearla Dubonnet, PharmD Clinical Pharmacist 03/05/2019 6:56 PM

## 2019-03-06 ENCOUNTER — Inpatient Hospital Stay: Payer: Medicare HMO

## 2019-03-06 DIAGNOSIS — R509 Fever, unspecified: Secondary | ICD-10-CM

## 2019-03-06 DIAGNOSIS — I10 Essential (primary) hypertension: Secondary | ICD-10-CM

## 2019-03-06 DIAGNOSIS — I2694 Multiple subsegmental pulmonary emboli without acute cor pulmonale: Secondary | ICD-10-CM

## 2019-03-06 LAB — CBC
HCT: 42 % (ref 39.0–52.0)
Hemoglobin: 14.5 g/dL (ref 13.0–17.0)
MCH: 30.2 pg (ref 26.0–34.0)
MCHC: 34.5 g/dL (ref 30.0–36.0)
MCV: 87.5 fL (ref 80.0–100.0)
Platelets: 273 10*3/uL (ref 150–400)
RBC: 4.8 MIL/uL (ref 4.22–5.81)
RDW: 13 % (ref 11.5–15.5)
WBC: 10.9 10*3/uL — ABNORMAL HIGH (ref 4.0–10.5)
nRBC: 0 % (ref 0.0–0.2)

## 2019-03-06 LAB — HEPARIN LEVEL (UNFRACTIONATED)
Heparin Unfractionated: 0.45 IU/mL (ref 0.30–0.70)
Heparin Unfractionated: 0.47 IU/mL (ref 0.30–0.70)

## 2019-03-06 LAB — PROTIME-INR
INR: 2.3 — ABNORMAL HIGH (ref 0.8–1.2)
Prothrombin Time: 25 seconds — ABNORMAL HIGH (ref 11.4–15.2)

## 2019-03-06 MED ORDER — LEVOFLOXACIN IN D5W 750 MG/150ML IV SOLN
750.0000 mg | INTRAVENOUS | Status: DC
  Administered 2019-03-06: 750 mg via INTRAVENOUS
  Filled 2019-03-06 (×2): qty 150

## 2019-03-06 MED ORDER — WARFARIN SODIUM 7.5 MG PO TABS: 7.5000 mg | ORAL_TABLET | Freq: Once | ORAL | Status: DC

## 2019-03-06 MED ORDER — SODIUM CHLORIDE 0.9% FLUSH
10.0000 mL | Freq: Two times a day (BID) | INTRAVENOUS | Status: DC
  Administered 2019-03-06 – 2019-03-07 (×2): 10 mL via INTRAVENOUS

## 2019-03-06 MED ORDER — WARFARIN SODIUM 6 MG PO TABS
6.5000 mg | ORAL_TABLET | Freq: Once | ORAL | Status: AC
  Administered 2019-03-06: 6.5 mg via ORAL
  Filled 2019-03-06: qty 1

## 2019-03-06 NOTE — Plan of Care (Signed)
  Problem: Clinical Measurements: Goal: Diagnostic test results will improve Outcome: Progressing Goal: Cardiovascular complication will be avoided Outcome: Progressing   Problem: Pain Managment: Goal: General experience of comfort will improve Outcome: Progressing   

## 2019-03-06 NOTE — Care Management Important Message (Signed)
Important Message  Patient Details  Name: ESAM GOECKNER MRN: [id number] Date of Birth: 1965/05/29   Medicare Important Message Given:  Yes     Juliann Pulse A Afshin Chrystal 03/06/2019, 10:30 AM

## 2019-03-06 NOTE — Progress Notes (Signed)
PROGRESS NOTE    Scott Foley  [id number] DOB: April 23, 1965 DOA: 03/03/2019 PCP: [name]    Brief Narrative:   StephenTayloris a53 y.o.Caucasian malewith a known history of coronary artery disease, hypertension dyslipidemia, who presented to emergency room with onset of dyspneaand dry cough for the last day with associated dyspnea on exertion.Uponpresentation to the emergency room, blood pressure was 171/121 with a pulse of 122 respiratory to 21 pulse 70 was 96% on room air with temperature of 98.8 and later 99.2. Labs were remarkable for mild hypokalemia of 3.4 and leukocytosis of 14.9 with hemoconcentration and neutrophilia. BNP was only 22 and high-sensitivity troponin I was 7. EKG showed sinus tachycardia with a rate of 122 with Q waves inferiorly and repeat EKG showed sinus tachycardia with a rate of 116 with left axis deviation. Portable chest x-ray showed streaky bibasilar opacities likely atelectasis or scarring. Chest CT angiogram revealed extensive bilateral pulmonary emboli     Consultants:   none  Procedures:  Venous US: 1. Positive for DVT on the right with occlusive clot seen in the popliteal, posterior tibial, and peroneal veins. 2. Bilateral slow venous flow and blunted respiratory phasicity, suggest enhanced abdominal imaging to exclude iliac or IVC Pathology.  CT abd/pelv 1. Common femoral vein, internal and external iliac veins and IVC are patent without venous thrombosis. 2. Bilateral pulmonary emboli are not well visualized on the current examination and are better delineated on CT a chest performed earlier same day. Bibasilar airspace disease, left greater than right, which may reflect atelectasis versus developing pulmonary infarct.   CtA chest 1. Extensive bilateral pulmonary emboli as detailed above with evidence for right heart strain with an RV/LV ratio measuring 1.1. 2. There is a developing airspace opacity at  the left lung base. This may represent an infiltrate or evolving pulmonary infarct in the setting of acute pulmonary emboli. 3. Small left-sided pleural effusion. 4. Stable bilateral pulmonary nodules.  Aortic Atherosclerosis (ICD10-I70.0) and Emphysema (ICD10-J43.9).  Antimicrobials:  levaquin    Subjective: Patient was febrile this a.m. 100.0.  Patient denies any shortness of breath, chest pain, or urinary symptoms.  Denies shortness of breath.  Objective: Vitals:   03/05/19 1616 03/05/19 1920 03/06/19 0337 03/06/19 0828  BP: 136/89 130/83 119/84 (!) 126/96  Pulse: 72 76 74 80  Resp: 18 19 18 16   Temp: 98.5 F (36.9 C) 98.2 F (36.8 C) 98.1 F (36.7 C) 100 F (37.8 C)  TempSrc: Oral Oral Oral Oral  SpO2: 97% 94% 93% 94%  Weight:   84 kg   Height:        Intake/Output Summary (Last 24 hours) at 03/06/2019 1225 Last data filed at 03/06/2019 0500 Gross per 24 hour  Intake 1211.92 ml  Output 1900 ml  Net -688.08 ml   Filed Weights   03/03/19 0004 03/04/19 0542 03/06/19 0337  Weight: 81.6 kg 84.3 kg 84 kg    Examination:  General exam: Appears calm and comfortable, NAD Respiratory system: Clear to auscultation. Respiratory effort normal. Cardiovascular system: RRR S1 & S2 heard,No murmurs, rubs, gallops or clicks.  Gastrointestinal system: Abdomen is nondistended, soft and nontender. Normal bowel sounds heard.  No rebound or guarding Central nervous system: Alert and oriented. No focal neurological deficits. Extremities: No edema Skin: Warm and dry Psychiatry: Judgement and insight appear normal. Mood & affect appropriate.     Data Reviewed: I have personally reviewed following labs and imaging studies  CBC: Recent Labs  Lab 03/03/19 0012 03/03/19  1001 03/04/19 0520 03/05/19 0449 03/06/19 0425  WBC 14.9* 11.9* 12.3* 11.7* 10.9*  NEUTROABS 10.2*  --   --   --   --   HGB 17.1* 15.5 15.5 14.2 14.5  HCT 49.6 44.6 44.0 41.4 42.0  MCV 87.9 87.8 86.1  87.9 87.5  PLT 281 241 244 245 [id number]   Basic Metabolic Panel: Recent Labs  Lab 03/03/19 0012 03/03/19 1001 03/04/19 0520 03/05/19 0449  NA 137 134* 134* 134*  K 3.4* 3.9 3.9 3.6  CL 102 101 104 106  CO2 24 23 20* 21*  GLUCOSE 102* 93 96 98  BUN 12 11 13 14   CREATININE 1.05 0.92 0.88 0.91  CALCIUM 9.5 8.7* 8.5* 8.6*  MG  --  2.1  --   --    GFR: Estimated Creatinine Clearance: 88.9 mL/min (by C-G formula based on SCr of 0.91 mg/dL). Liver Function Tests: Recent Labs  Lab 03/03/19 0012  AST 12*  ALT 12  ALKPHOS 104  BILITOT 0.7  PROT 7.9  ALBUMIN 4.1   No results for input(s): LIPASE, AMYLASE in the last 168 hours. No results for input(s): AMMONIA in the last 168 hours. Coagulation Profile: Recent Labs  Lab 03/03/19 0216 03/04/19 0520 03/05/19 0449 03/06/19 0425  INR 1.2 1.2 1.7* 2.3*   Cardiac Enzymes: No results for input(s): CKTOTAL, CKMB, CKMBINDEX, TROPONINI in the last 168 hours. BNP (last 3 results) No results for input(s): PROBNP in the last 8760 hours. HbA1C: No results for input(s): HGBA1C in the last 72 hours. CBG: No results for input(s): GLUCAP in the last 168 hours. Lipid Profile: No results for input(s): CHOL, HDL, LDLCALC, TRIG, CHOLHDL, LDLDIRECT in the last 72 hours. Thyroid Function Tests: No results for input(s): TSH, T4TOTAL, FREET4, T3FREE, THYROIDAB in the last 72 hours. Anemia Panel: No results for input(s): VITAMINB12, FOLATE, FERRITIN, TIBC, IRON, RETICCTPCT in the last 72 hours. Sepsis Labs: No results for input(s): PROCALCITON, LATICACIDVEN in the last 168 hours.  Recent Results (from the past 240 hour(s))  SARS CORONAVIRUS 2 (TAT 6-24 HRS) Nasopharyngeal Nasopharyngeal Swab     Status: None   Collection Time: 03/03/19  1:53 AM   Specimen: Nasopharyngeal Swab  Result Value Ref Range Status   SARS Coronavirus 2 NEGATIVE NEGATIVE Final    Comment: (NOTE) SARS-CoV-2 target nucleic acids are NOT DETECTED. The SARS-CoV-2 RNA is  generally detectable in upper and lower respiratory specimens during the acute phase of infection. Negative results do not preclude SARS-CoV-2 infection, do not rule out co-infections with other pathogens, and should not be used as the sole basis for treatment or other patient management decisions. Negative results must be combined with clinical observations, patient history, and epidemiological information. The expected result is Negative. Fact Sheet for Patients: SugarRoll.be Fact Sheet for Healthcare Providers: https://www.woods-mathews.com/ This test is not yet approved or cleared by the [place] FDA and  has been authorized for detection and/or diagnosis of SARS-CoV-2 by FDA under an Emergency Use Authorization (EUA). This EUA will remain  in effect (meaning this test can be used) for the duration of the COVID-19 declaration under Section 56 4(b)(1) of the Act, 21 U.S.C. section 360bbb-3(b)(1), unless the authorization is terminated or revoked sooner. Performed at [facility], [place] [address], [place], [place] [address]          Radiology Studies: DG Chest Port 1 View  Result Date: 03/05/2019 CLINICAL DATA:  Shortness of breath EXAM: PORTABLE CHEST 1 VIEW COMPARISON:  March 03, 2019 FINDINGS: There is bibasilar atelectasis. There is a small left pleural effusion. Heart size and pulmonary vascularity are normal. No adenopathy. No bone lesions. IMPRESSION: Apparent bibasilar atelectasis a small left pleural effusion. No frank consolidation. Cardiac silhouette within normal limits. No adenopathy. Electronically Signed   By: Lowella Grip III M.D.   On: 03/05/2019 07:57        Scheduled Meds: . clopidogrel  75 mg Oral Daily  . levofloxacin  500 mg Oral Daily  . lisinopril  40 mg Oral Daily  . metoprolol tartrate  50 mg Oral BID  . omega-3 acid ethyl esters  1 g Oral Daily  . pravastatin  40 mg Oral Daily  .  warfarin  6.5 mg Oral ONCE-1800  . Warfarin - Pharmacist Dosing Inpatient   Does not apply q1800   Continuous Infusions:  Assessment & Plan:   Active Problems:   Acute pulmonary embolism ([place])   DNR (do not resuscitate)  1.Acute extensive bilateral pulmonary emboli: On IV heparin drip and Coumadin.  INR 2.3 today Goal INR 2-3  We will DC IV heparin Spoke to pharmacy will decrease Coumadin to 6.5 mg today as his INR jumped from 1.7-2.3 overnight.  Being that he is on antibiotic may increase faster. Continue telemetry  Echodoes not show any right strain as per cardio. IVC patent.   Will need a hypercoagulable work-up in approx 3 months as this is the pt's second blood clot, previous blood clot approx 1-1.5 year ago.  Hypercoagulable workup as per hematology as an outpatient, will need to set up as pt tells me he does not have one  Pt's PCP took him off of coumadin after 6 months and put the pt on plavix for unknown reason.  Had discussed with patient about risk and complications of anticoagulation including brain hemorrhage and GI bleed and he verbalizes an understanding.  2 .pulmonary infarct vs pneumonia: as per CTA chest. Likely secondary to pulmonary infarct as the pt has extensive B/l PE.   3 .fever Febrile this AM.  Will repeat chest x-ray, UA, blood cultures.  Change p.o. Levaquin back to IV 750 mg for possible pneumonia.  4 .B/l pulmonary nodules: stable as per CTA chest. Fu as outpt monitoring  5.Leukocytosis: likely reactive. Will continue to monitor. No fevers.  6.oronary artery disease:will continue his Plavix, beta-blocker , statin  7.HLD: continue on statin  8.Hypokalemia: KCl was repleted and stable  9.Hypertension:continuelopressor, lisinopril  10.Depression: severity unknown. Continue on home dose ofWellbutrin XL.   DVT prophylaxis:  Coumadin Code Status:DNR/DNI Family Communication: none at bedside Disposition Plan:  Patient will remain  tonight as he is febrile this a.m. will need to be afebrile 24 hours prior to discharge.         LOS: 3 days   Time spent: 45 minutes with more than 50% COC    Nolberto Hanlon, MD [facility] Pager [phone]  If 7PM-7AM, please contact night-coverage [email or web address] Password [facility] 03/06/2019, 12:25 PM

## 2019-03-06 NOTE — Progress Notes (Signed)
[place] for heparin bridge and start of warfarin  Indication: pulmonary embolus  Allergies  Allergen Reactions  . Penicillins    Patient Measurements: Height: 5\' 3"  (160 cm) Weight: 185 lb 1.6 oz (84 kg) IBW/kg (Calculated) : 56.9 Heparin Dosing Weight: 66.8 kg  Vital Signs: Temp: 98.1 F (36.7 C) (12/24 0337) Temp Source: Oral (12/24 0337) BP: 119/84 (12/24 0337) Pulse Rate: 74 (12/24 0337)  Labs: Recent Labs    03/03/19 1001 03/04/19 0520 03/05/19 0449 03/05/19 1753 03/05/19 2344 03/06/19 0425  HGB 15.5 15.5 14.2  --   --  14.5  HCT 44.6 44.0 41.4  --   --  42.0  PLT 241 244 245  --   --  273  LABPROT  --  15.2 20.3*  --   --  25.0*  INR  --  1.2 1.7*  --   --  2.3*  HEPARINUNFRC 0.23*  --  0.47 0.45 0.45 0.47  CREATININE 0.92 0.88 0.91  --   --   --     Estimated Creatinine Clearance: 88.9 mL/min (by C-G formula based on SCr of 0.91 mg/dL).   Medical History: Past Medical History:  Diagnosis Date  . CAD (coronary artery disease) 2012   AMI No heart cath, treated medically [facility]  . History of stroke   . Hyperlipemia   . Hypertension   . PE (pulmonary thromboembolism) (HCC)     Medications:  Scheduled:  . sodium bicarbonate      . clopidogrel  75 mg Oral Daily  . levofloxacin  500 mg Oral Daily  . lisinopril  40 mg Oral Daily  . metoprolol tartrate  50 mg Oral BID  . omega-3 acid ethyl esters  1 g Oral Daily  . pravastatin  40 mg Oral Daily  . Warfarin - Pharmacist Dosing Inpatient   Does not apply q1800    Assessment: Patient arrives x SOB and chest tightness, found to have bilateral pulmonary emboli w/ R heart strain. EKG showing sinus tach, HTN w/ systolic 123456 and HR [id number] - 120's. No anticoagulation PTA. Baseline CBC WNL w/ Hgb slightly above normal range, aPTT/INR pending. Patient is being started on heparin drip for anticoagulation of bilateral pulmonary emboli.  Current Heparin rate 1200  units/hr  12/21 1001 HL 0.23 - subtherapeutic 12/21 1719 HL 0.23 - subtherapeutic 12/21 2347 HL 0.23 - subtherapeutic 12/22 0734 HL 0.42 - therapeutic 12/22 1400 HL < 0.1 - heparin was stopped at 1315 and restarted around 1515.  12/22 2129 HL 0.12 - 2200 units IV X 1 bolus and increase drip rate to 1900 units/hr. 12/23 0449 HL 0.47 - therapeutic  12/23 1054 HL 0.28 - subtherapeutic will increase rate to 2000 units/hr 12/23 1753 HL 0.45 - therapeutic 12/23 2344 HL 0.45 - therapeutic x 2 12/24 0425 HL 0.47 - therapeutic x 3   Date INR Warfarin Dose  12/21 1.2 5 mg + 7.5 mg  12/22 1.2 10 mg  12/23 1.7 7.5 mg    Goal of Therapy:  Heparin level 0.3-0.7 units/ml Monitor platelets by anticoagulation protocol: Yes   Plan:  Heparin: Heparin level is currently therapeutic. Will continue rate of 2000 units/hr. Will check anti-Xa level daily. Daily CBC while on heparin. CBC stable.  Warfarin: Day 3 out of 5 bridge with heparin. INR subtherapeutic at 1.7. Goal INR 2 - 3. Pt is 53 yrs with a BMI of 32. Pt started on levofloxacin, which can increase INR.  INR bump probably due to 12.5 mg given on 12/21. Predict INR will be therapeutic tomorrow. Will give 7.5 mg x 1 tonight and adjust depending on the trend of INR. Daily INR ordered.   Will continue heparin bridge until patient achieves two therapeutic INRs and is overlapped for at least 5 days of bridge therapy.  Ena Dawley, PharmD Clinical Pharmacist 03/06/2019 5:27 AM

## 2019-03-06 NOTE — Progress Notes (Signed)
[place] for heparin bridge and start of warfarin  Indication: pulmonary embolus  Allergies  Allergen Reactions  . Penicillins     Patient Measurements: Height: 5\' 3"  (160 cm) Weight: 185 lb 14.4 oz (84.3 kg) IBW/kg (Calculated) : 56.9 Heparin Dosing Weight: 66.8 kg  Vital Signs: Temp: 98.2 F (36.8 C) (12/23 1920) Temp Source: Oral (12/23 1920) BP: 130/83 (12/23 1920) Pulse Rate: 76 (12/23 1920)  Labs: Recent Labs    03/03/19 0216 03/03/19 1001 03/04/19 0520 03/05/19 0449 03/05/19 1054 03/05/19 1753 03/05/19 2344  HGB  --  15.5 15.5 14.2  --   --   --   HCT  --  44.6 44.0 41.4  --   --   --   PLT  --  241 244 245  --   --   --   APTT >160*  --   --   --   --   --   --   LABPROT 15.1  --  15.2 20.3*  --   --   --   INR 1.2  --  1.2 1.7*  --   --   --   HEPARINUNFRC  --  0.23*  --  0.47 0.28* 0.45 0.45  CREATININE  --  0.92 0.88 0.91  --   --   --   TROPONINIHS 7  --   --   --   --   --   --     Estimated Creatinine Clearance: 89.1 mL/min (by C-G formula based on SCr of 0.91 mg/dL).   Medical History: Past Medical History:  Diagnosis Date  . CAD (coronary artery disease) 2012   AMI No heart cath, treated medically [facility]  . History of stroke   . Hyperlipemia   . Hypertension   . PE (pulmonary thromboembolism) (HCC)     Medications:  Scheduled:  . sodium bicarbonate      . clopidogrel  75 mg Oral Daily  . levofloxacin  500 mg Oral Daily  . lisinopril  40 mg Oral Daily  . metoprolol tartrate  50 mg Oral BID  . omega-3 acid ethyl esters  1 g Oral Daily  . pravastatin  40 mg Oral Daily  . Warfarin - Pharmacist Dosing Inpatient   Does not apply q1800    Assessment: Patient arrives x SOB and chest tightness, found to have bilateral pulmonary emboli w/ R heart strain. EKG showing sinus tach, HTN w/ systolic 123456 and HR [id number] - 120's. No anticoagulation PTA. Baseline CBC WNL w/ Hgb slightly above normal range,  aPTT/INR pending. Patient is being started on heparin drip for anticoagulation of bilateral pulmonary emboli.  Current Heparin rate 1200 units/hr  12/21 1001 HL 0.23 - subtherapeutic 12/21 1719 HL 0.23 - subtherapeutic 12/21 2347 HL 0.23 - subtherapeutic 12/22 0734 HL 0.42 - therapeutic 12/22 1400 HL < 0.1 - heparin was stopped at 1315 and restarted around 1515.  12/22 2129 HL 0.12 - 2200 units IV X 1 bolus and increase drip rate to 1900 units/hr. 12/23 0449 HL 0.47 - therapeutic  12/23 1054 HL 0.28 - subtherapeutic will increase rate to 2000 units/hr 12/23 1753 HL 0.45 - therapeutic 12/23 2344 HL 0.45 - therapeutic x 2   Date INR Warfarin Dose  12/21 1.2 5 mg + 7.5 mg  12/22 1.2 10 mg  12/23 1.7 7.5 mg    Goal of Therapy:  Heparin level 0.3-0.7 units/ml Monitor platelets by anticoagulation protocol: Yes  Plan:  Heparin: Heparin level is currently therapeutic. Will continue rate of 2000 units/hr. Will check anti-Xa level daily Daily CBC while on heparin. CBC stable.  Warfarin: Day 3 out of 5 bridge with heparin. INR subtherapeutic at 1.7. Goal INR 2 - 3. Pt is 53 yrs with a BMI of 32. Pt started on levofloxacin, which can increase INR. INR bump probably due to 12.5 mg given on 12/21. Predict INR will be therapeutic tomorrow. Will give 7.5 mg x 1 tonight and adjust depending on the trend of INR. Daily INR ordered.   Will continue heparin bridge until patient achieves two therapeutic INRs and is overlapped for at least 5 days of bridge therapy.  Ena Dawley, PharmD Clinical Pharmacist 03/06/2019 12:35 AM

## 2019-03-06 NOTE — Progress Notes (Addendum)
[place] for heparin bridge and start of warfarin  Indication: pulmonary embolus  Allergies  Allergen Reactions  . Penicillins    Patient Measurements: Height: 5\' 3"  (160 cm) Weight: 185 lb 1.6 oz (84 kg) IBW/kg (Calculated) : 56.9 Heparin Dosing Weight: 66.8 kg  Vital Signs: Temp: 98.1 F (36.7 C) (12/24 0337) Temp Source: Oral (12/24 0337) BP: 119/84 (12/24 0337) Pulse Rate: 74 (12/24 0337)  Labs: Recent Labs    03/03/19 1001 03/04/19 0520 03/05/19 0449 03/05/19 1753 03/05/19 2344 03/06/19 0425  HGB 15.5 15.5 14.2  --   --  14.5  HCT 44.6 44.0 41.4  --   --  42.0  PLT 241 244 245  --   --  273  LABPROT  --  15.2 20.3*  --   --  25.0*  INR  --  1.2 1.7*  --   --  2.3*  HEPARINUNFRC 0.23*  --  0.47 0.45 0.45 0.47  CREATININE 0.92 0.88 0.91  --   --   --     Estimated Creatinine Clearance: 88.9 mL/min (by C-G formula based on SCr of 0.91 mg/dL).   Medical History: Past Medical History:  Diagnosis Date  . CAD (coronary artery disease) 2012   AMI No heart cath, treated medically [facility]  . History of stroke   . Hyperlipemia   . Hypertension   . PE (pulmonary thromboembolism) (HCC)     Medications:  Scheduled:  . sodium bicarbonate      . clopidogrel  75 mg Oral Daily  . levofloxacin  500 mg Oral Daily  . lisinopril  40 mg Oral Daily  . metoprolol tartrate  50 mg Oral BID  . omega-3 acid ethyl esters  1 g Oral Daily  . pravastatin  40 mg Oral Daily  . Warfarin - Pharmacist Dosing Inpatient   Does not apply q1800    Assessment: Patient arrives x SOB and chest tightness, found to have bilateral pulmonary emboli w/ R heart strain. EKG showing sinus tach, HTN w/ systolic 123456 and HR [id number] - 120's. No anticoagulation PTA. Baseline CBC WNL w/ Hgb slightly above normal range, aPTT/INR pending. Patient is being started on heparin drip for anticoagulation of bilateral pulmonary emboli.  Current Heparin rate 1200  units/hr  12/23 0449 HL 0.47 - therapeutic  12/23 1054 HL 0.28 - subtherapeutic will increase rate to 2000 units/hr 12/23 1753 HL 0.45 - therapeutic 12/23 2344 HL 0.45 - therapeutic x 2 12/24 0425 HL 0.47 - therapeutic x 3   Date INR Warfarin Dose  12/21 1.2 5 mg + 7.5 mg  12/22 1.2 10 mg  12/23 1.7 7.5 mg   12/24 2.3 6.5 mg   Goal of Therapy:  Heparin level 0.3-0.7 units/ml Monitor platelets by anticoagulation protocol: Yes   Plan:  Heparin: Heparin level therapeutic x 2. Will continue rate of 2000 units/hr. Will check anti-Xa level daily. Daily CBC while on heparin. CBC stable.  Warfarin: Warfarin started on 12/21. Day 4 out of 5 bridge with heparin. INR therapeutic at 2.3. Goal INR 2 - 3. Pt is 53 yrs with a BMI of 32 - may need slight higher doses. Pt started on levofloxacin, which can increase INR.  Will give 6.5 mg x 1 tonight and adjust depending on the trend of INR. If INR remains therapeutic tomorrow - can d/c the heparin gtt. Daily INR ordered.   Will continue heparin bridge until patient achieves two therapeutic INRs and  is overlapped for at least 5 days of bridge therapy. Pt will need to be on warfarin long-term due to hx of DVT/PE.   Oswald Hillock, PharmD, BCPS Clinical Pharmacist 03/06/2019 7:28 AM

## 2019-03-06 NOTE — TOC Transition Note (Addendum)
Transition of Care [facility]) - CM/SW Discharge Note   Patient Details  Name: Scott Foley MRN: [id number] Date of Birth: [birth year]  [facility] CM/SW Contact:  Ross Ludwig, LCSW Phone Number: 03/06/2019, 11:47 AM   Clinical Narrative:    Patient is a 53 year old male who is alert and oriented x4.  CSW received consult that patient will need an INR check on Saturday.  CSW spoke to home health agencies, and Alvis Lemmings said they are able to see patient on Saturday for the INR check.  Patient should be discharging back home today, patient did not express any other needs.  Patient's address is a [facility] [address], [place], [place] [address], [place] advised home health agency to call once they arrive to RV park so patient can tell which lot they are in.  2:00pm  Patient had a fever this morning, not ready for discharge, potentially discharging tomorrow.  Final next level of care: [place] Barriers to Discharge: Barriers Resolved   Patient Goals and CMS Choice Patient states their goals for this hospitalization and ongoing recovery are:: To return back home with home health. CMS Medicare.gov Compare Post Acute Care list provided to:: Patient Choice offered to / list presented to : Patient  Discharge Placement                       Discharge Plan and Services                DME Arranged: N/A         HH Arranged: RN [place] Agency: [place] Date [place]: 03/06/19 Time [place]: 1030 Representative spoke with at [place]: [place] Determinants of Health ([place]) Interventions     Readmission Risk Interventions No flowsheet data found.

## 2019-03-07 DIAGNOSIS — R918 Other nonspecific abnormal finding of lung field: Secondary | ICD-10-CM

## 2019-03-07 LAB — CBC WITH DIFFERENTIAL/PLATELET
Abs Immature Granulocytes: 0.06 10*3/uL (ref 0.00–0.07)
Basophils Absolute: 0.1 10*3/uL (ref 0.0–0.1)
Basophils Relative: 1 %
Eosinophils Absolute: 0.2 10*3/uL (ref 0.0–0.5)
Eosinophils Relative: 2 %
HCT: 41.9 % (ref 39.0–52.0)
Hemoglobin: 15 g/dL (ref 13.0–17.0)
Immature Granulocytes: 1 %
Lymphocytes Relative: 20 %
Lymphs Abs: 2.1 10*3/uL (ref 0.7–4.0)
MCH: 30.2 pg (ref 26.0–34.0)
MCHC: 35.8 g/dL (ref 30.0–36.0)
MCV: 84.3 fL (ref 80.0–100.0)
Monocytes Absolute: 0.8 10*3/uL (ref 0.1–1.0)
Monocytes Relative: 8 %
Neutro Abs: 7.3 10*3/uL (ref 1.7–7.7)
Neutrophils Relative %: 68 %
Platelets: 304 10*3/uL (ref 150–400)
RBC: 4.97 MIL/uL (ref 4.22–5.81)
RDW: 13.2 % (ref 11.5–15.5)
WBC: 10.6 10*3/uL — ABNORMAL HIGH (ref 4.0–10.5)
nRBC: 0 % (ref 0.0–0.2)

## 2019-03-07 LAB — BASIC METABOLIC PANEL
Anion gap: 12 (ref 5–15)
BUN: 20 mg/dL (ref 6–20)
CO2: 19 mmol/L — ABNORMAL LOW (ref 22–32)
Calcium: 9 mg/dL (ref 8.9–10.3)
Chloride: 103 mmol/L (ref 98–111)
Creatinine, Ser: 1.04 mg/dL (ref 0.61–1.24)
GFR calc Af Amer: >60 mL/min (ref >60)
GFR calc non Af Amer: >60 mL/min (ref >60)
Glucose, Bld: 136 mg/dL — ABNORMAL HIGH (ref 70–99)
Potassium: 3.7 mmol/L (ref 3.5–5.1)
Sodium: 134 mmol/L — ABNORMAL LOW (ref 135–145)

## 2019-03-07 LAB — PROTIME-INR
INR: 2.5 — ABNORMAL HIGH (ref 0.8–1.2)
Prothrombin Time: 27.3 seconds — ABNORMAL HIGH (ref 11.4–15.2)

## 2019-03-07 LAB — URINE CULTURE: Culture: NO GROWTH

## 2019-03-07 LAB — HEPARIN LEVEL (UNFRACTIONATED): Heparin Unfractionated: <0.1 IU/mL — ABNORMAL LOW (ref 0.30–0.70)

## 2019-03-07 MED ORDER — WARFARIN SODIUM 6 MG PO TABS
6.5000 mg | ORAL_TABLET | Freq: Once | ORAL | Status: DC
  Filled 2019-03-07: qty 1

## 2019-03-07 MED ORDER — LEVOFLOXACIN IN D5W 750 MG/150ML IV SOLN: 750.0000 mg | INTRAVENOUS | Status: DC

## 2019-03-07 MED ORDER — WARFARIN SODIUM 6 MG PO TABS: 6.0000 mg | ORAL_TABLET | Freq: Once | ORAL | 0 refills | Status: DC

## 2019-03-07 MED ORDER — WARFARIN SODIUM 6 MG PO TABS
6.0000 mg | ORAL_TABLET | Freq: Once | ORAL | Status: DC
  Filled 2019-03-07: qty 1

## 2019-03-07 MED ORDER — LEVOFLOXACIN 750 MG PO TABS: 750.0000 mg | ORAL_TABLET | Freq: Every day | ORAL | 0 refills | Status: AC

## 2019-03-07 NOTE — Progress Notes (Signed)
[place] for heparin bridge and start of warfarin  Indication: pulmonary embolus  Allergies  Allergen Reactions  . Penicillins    Patient Measurements: Height: 5\' 3"  (160 cm) Weight: 185 lb 1.6 oz (84 kg) IBW/kg (Calculated) : 56.9 Heparin Dosing Weight: 66.8 kg  Vital Signs: Temp: 98 F (36.7 C) (12/25 0422) BP: 103/73 (12/25 0422) Pulse Rate: 75 (12/25 0422)  Labs: Recent Labs    03/04/19 0520 03/05/19 0449 03/05/19 2344 03/06/19 0425 03/07/19 0355  HGB 15.5 14.2  --  14.5 15.0  HCT 44.0 41.4  --  42.0 41.9  PLT 244 245  --  273 304  LABPROT 15.2 20.3*  --  25.0* 27.3*  INR 1.2 1.7*  --  2.3* 2.5*  HEPARINUNFRC  --  0.47 0.45 0.47 <0.10*  CREATININE 0.88 0.91  --   --  1.04    Estimated Creatinine Clearance: 77.8 mL/min (by C-G formula based on SCr of 1.04 mg/dL).   Medical History: Past Medical History:  Diagnosis Date  . CAD (coronary artery disease) 2012   AMI No heart cath, treated medically [facility]  . History of stroke   . Hyperlipemia   . Hypertension   . PE (pulmonary thromboembolism) (HCC)     Medications:  Scheduled:  . clopidogrel  75 mg Oral Daily  . levofloxacin  500 mg Oral Daily  . lisinopril  40 mg Oral Daily  . metoprolol tartrate  50 mg Oral BID  . omega-3 acid ethyl esters  1 g Oral Daily  . pravastatin  40 mg Oral Daily  . sodium chloride flush  10 mL Intravenous Q12H  . Warfarin - Pharmacist Dosing Inpatient   Does not apply q1800    Assessment: Patient arrives x SOB and chest tightness, found to have bilateral pulmonary emboli w/ R heart strain. EKG showing sinus tach, HTN w/ systolic 123456 and HR [id number] - 120's. No anticoagulation PTA. Baseline CBC WNL w/ Hgb slightly above normal range, aPTT/INR pending. Patient is being started on heparin drip for anticoagulation of bilateral pulmonary emboli.  Current Heparin rate 1200 units/hr  12/23 0449 HL 0.47 - therapeutic  12/23 1054 HL 0.28 -  subtherapeutic will increase rate to 2000 units/hr 12/23 1753 HL 0.45 - therapeutic 12/23 2344 HL 0.45 - therapeutic x 2 12/24 0425 HL 0.47 - therapeutic x 3   Date INR Warfarin Dose  12/21 1.2 5 mg + 7.5 mg  12/22 1.2 10 mg  12/23 1.7 7.5 mg   12/24 2.3 6.5 mg   Goal of Therapy:  Heparin level 0.3-0.7 units/ml Monitor platelets by anticoagulation protocol: Yes   Plan:  Heparin: Heparin level therapeutic x 2. Will continue rate of 2000 units/hr. Will check anti-Xa level daily. Daily CBC while on heparin. CBC stable.  Warfarin: Warfarin started on 12/21. Day 4 out of 5 bridge with heparin. INR therapeutic at 2.3. Goal INR 2 - 3. Pt is 53 yrs with a BMI of 32 - may need slight higher doses. Pt started on levofloxacin, which can increase INR.  Will give 6.5 mg x 1 tonight and adjust depending on the trend of INR. If INR remains therapeutic tomorrow - can d/c the heparin gtt. Daily INR ordered.   Will continue heparin bridge until patient achieves two therapeutic INRs and is overlapped for at least 5 days of bridge therapy. Pt will need to be on warfarin long-term due to hx of DVT/PE.   12/25 @ 0355  HL < 0.10: Heparin infusion was stopped yesterday.  INR is therapeutic this am @ 2.5, CBC OK.  Follow up for Coumadin dose later today.  Ena Dawley, PharmD Clinical Pharmacist 03/07/2019 5:19 AM

## 2019-03-07 NOTE — Progress Notes (Addendum)
[place] [facility] for warfarin  Indication: pulmonary embolus  Allergies  Allergen Reactions  . Penicillins    Patient Measurements: Height: 5\' 3"  (160 cm) Weight: 185 lb 1.6 oz (84 kg) IBW/kg (Calculated) : 56.9 Heparin Dosing Weight: 66.8 kg  Vital Signs: Temp: 98.6 F (37 C) (12/25 0826) BP: 116/85 (12/25 0826) Pulse Rate: 67 (12/25 0826)  Labs: Recent Labs    03/05/19 0449 03/05/19 2344 03/06/19 0425 03/07/19 0355  HGB 14.2  --  14.5 15.0  HCT 41.4  --  42.0 41.9  PLT 245  --  273 304  LABPROT 20.3*  --  25.0* 27.3*  INR 1.7*  --  2.3* 2.5*  HEPARINUNFRC 0.47 0.45 0.47 <0.10*  CREATININE 0.91  --   --  1.04    Estimated Creatinine Clearance: 77.8 mL/min (by C-G formula based on SCr of 1.04 mg/dL).   Medical History: Past Medical History:  Diagnosis Date  . CAD (coronary artery disease) 2012   AMI No heart cath, treated medically [facility]  . History of stroke   . Hyperlipemia   . Hypertension   . PE (pulmonary thromboembolism) (HCC)     Medications:  Scheduled:  . clopidogrel  75 mg Oral Daily  . levofloxacin  500 mg Oral Daily  . lisinopril  40 mg Oral Daily  . metoprolol tartrate  50 mg Oral BID  . omega-3 acid ethyl esters  1 g Oral Daily  . pravastatin  40 mg Oral Daily  . sodium chloride flush  10 mL Intravenous Q12H  . Warfarin - Pharmacist Dosing Inpatient   Does not apply q1800    Assessment: Patient arrives x SOB and chest tightness, found to have bilateral pulmonary emboli w/ R heart strain. EKG showing sinus tach, HTN w/ systolic 123456 and HR [id number] - 120's. No anticoagulation PTA. Baseline CBC WNL w/ Hgb slightly above normal range, aPTT/INR pending.  Date INR Warfarin Dose  12/21 1.2 5 mg + 7.5 mg  12/22 1.2 10 mg  12/23 1.7 7.5 mg   12/24 2.3 6.5 mg  12/25 2.5 6 mg   Goal of Therapy:  Heparin level 0.3-0.7 units/ml Monitor platelets by anticoagulation protocol: Yes   Plan:  Warfarin: Warfarin  started on 12/21. Day 5 out of 5 bridge with heparin - heparin d/c'ed. INR therapeutic at 2.3 x 2. Goal INR 2 - 3. Pt is 53 yrs with a BMI of 32 - may need slight higher doses. Pt started on levofloxacin, which can increase INR (PO changed back to IV).  Will give 6 mg x 1 tonight and adjust depending on the trend of INR. Daily INR ordered. Pt will need to be on warfarin long-term due to hx of DVT/PE. Warfarin should be at steady state now and INR should start to level out.   Discharge Recommendation: discharge patient on warfarin 6 mg daily with INR check up in 3-5 days.   Oswald Hillock, PharmD, BCPS Clinical Pharmacist 03/07/2019 10:07 AM

## 2019-03-07 NOTE — Discharge Summary (Signed)
Scott Foley [id number] DOB: 1965/03/24 DOA: 03/03/2019  PCP: [name]  Admit date: 03/03/2019 Discharge date: 03/07/2019  Admitted From: Home Disposition: Home  Recommendations for Outpatient Follow-up:  1. Follow up with PCP on Monday to have INR checked, needs f/u cxr by pcp  2. Please obtain BMP/CBC by monday 3. Please follow up on the following pending results: Blood and urine culture  Home Health: Yes nursing, check INR tomorrow   Discharge Condition:Stable CODE STATUS: DNR/DNI Diet recommendation: Heart Healthy  Brief/Interim Summary: StephenTayloris 53 y.o.Caucasian malewith a known history of coronary artery disease, hypertension dyslipidemia, who presented to emergency room with onset of dyspneaand dry cough for the last day with associated dyspnea on exertion. EKG showed sinus tachycardia with a rate of 116 with left axis deviation. Portable chest x-ray showed streaky bibasilar opacities likely atelectasis or scarring. Chest CT angiogram revealed extensive bilateral pulmonary emboli.  Also had venous Doppler revealing positive for DVT with full results stated below.  CT of abdomen and pelvis was done to exclude iliac or IVC pathology which revealed common femoral vein, internal and external iliac veins and IVC all patent without venous thrombosis.  CT of the chest also had revealed stable bilateral pulmonary nodules which he is to follow-up as outpatient for monitoring.  Patient was started on heparin drip and Coumadin and once his INR was therapeutic with a goal INR of 2-3 his heparin drip was discontinued.  He became febrile yesterday was started on IV antibiotics.  Blood cultures and urine cultures so far are negative and finals are pending.  A repeat chest x-ray on 03/06/2019 revealed similar patchy bibasilar lung opacity with improved lung volumes.  Differential includes atelectasis, aspiration and or pneumonia.  Chest radiograph follow-up advised.   Stable small left pleural effusion.  He has remained afebrile over 24 hours and is stable to be discharged home.  He was advised to follow-up with PCP on Monday to get INR checked.  We will set home nursing for Saturday to get INR checked. He will also need to follow-up with hematology [facility] in 2 weeks for hypercoagulable work-up and this was relayed to patient verbally and he verbalizes an understanding.  Also discussed risk and complications of anticoagulation with the patient including GI bleed and brain hemorrhaging and he verbalizes an understanding.   Discharge Diagnoses:  Active Problems:   Multiple subsegmental pulmonary emboli without acute cor pulmonale (HCC)   DNR (do not resuscitate)   Fever    Discharge Instructions  Discharge Instructions    Call MD for:  difficulty breathing, headache or visual disturbances   Complete by: As directed    Call MD for:  temperature >100.4   Complete by: As directed    Diet - low sodium heart healthy   Complete by: As directed    Discharge instructions   Complete by: As directed    [name] on Monday to have INR checked Follow-up with hematology in 2 weeks for further work-up of blood clots   Face-to-face encounter (required for Medicare/Medicaid patients)   Complete by: As directed    I Scott Foley certify that this patient is under my care and that I, or a nurse practitioner or physician's assistant working with me, had a face-to-face encounter that meets the physician face-to-face encounter requirements with this patient on 03/07/2019. The encounter with the patient was in whole, or in part for the following medical condition(s) which is the primary reason for home health  care (List medical condition): pulmonary embolism and dvt   The encounter with the patient was in whole, or in part, for the following medical condition, which is the primary reason for home health care: pulmonary embolism   I certify that, based on my  findings, the following services are medically necessary home health services: Nursing   Reason for Medically Necessary Home Health Services: Skilled Nursing- Lab Monitoring Requiring Frequent Medication Adjustment   My clinical findings support the need for the above services: OTHER SEE COMMENTS   Further, I certify that my clinical findings support that this patient is homebound due to: Shortness of Breath with activity   Home Health   Complete by: As directed    To provide the following care/treatments: RN   See patient tomorrow for INR check .goal INR 2-3. Report to pcp   Increase activity slowly   Complete by: As directed      Allergies as of 03/07/2019      Reactions   Penicillins       Medication List    TAKE these medications   clopidogrel 75 MG tablet Commonly known as: PLAVIX Take 75 mg by mouth daily.   Fish Oil 1000 MG Caps Take by mouth 2 (two) times daily.   levofloxacin 750 MG tablet Commonly known as: Levaquin Take 1 tablet (750 mg total) by mouth daily for 10 days.   lisinopril 40 MG tablet Commonly known as: ZESTRIL Take 40 mg by mouth daily.   metoprolol tartrate 50 MG tablet Commonly known as: LOPRESSOR Take 50 mg by mouth 2 (two) times daily.   pravastatin 40 MG tablet Commonly known as: PRAVACHOL Take 40 mg by mouth daily.   warfarin 6 MG tablet Commonly known as: COUMADIN Take 1 tablet (6 mg total) by mouth one time only at 6 PM.      Follow-up Information    [facility] Follow up in 2 week(s).   Why: will need an appt to get a hypercoagulable work-up as pt has 2 blood clots of unknown etiology        [name] Follow up in 2 day(s).   Why: need to setup for inr Contact information: [address] [place] [address] [phone]          Allergies  Allergen Reactions  . Penicillins     Consultations:  none   Procedures/Studies: DG Chest 1 View  Result Date: 03/03/2019 CLINICAL  DATA:  Shortness of breath. Tachycardia. EXAM: CHEST  1 VIEW COMPARISON:  Radiograph 11/07/2017. CT 06/28/2017 FINDINGS: Low lung volumes. Normal heart size with unchanged mediastinal CT contours. Streaky bibasilar opacities, improved on the right and worsened on the left from prior exam. No pneumothorax or large pleural effusion. No pulmonary edema. No acute osseous abnormalities are seen. IMPRESSION: Streaky bibasilar opacities. These favor atelectasis or scarring, and are improved from 2019 radiograph on the right, but worsened on the left. Electronically Signed   By: Keith Rake M.D.   On: 03/03/2019 00:41   DG Chest 2 View  Result Date: 03/06/2019 CLINICAL DATA:  Dyspnea, nonproductive cough EXAM: CHEST - 2 VIEW COMPARISON:  03/05/2019 chest radiograph. FINDINGS: Stable cardiomediastinal silhouette with normal heart size. No pneumothorax. Small left pleural effusion, stable. No right pleural effusion. No overt pulmonary edema. Patchy bibasilar lung opacities appear similar. Improved lung volumes. IMPRESSION: 1. Similar patchy bibasilar lung opacities with improved lung volumes. Differential includes atelectasis, aspiration and/or pneumonia. Chest radiograph follow-up advised. 2.  Stable small left pleural effusion. Electronically Signed   By: Ilona Sorrel M.D.   On: 03/06/2019 13:21   CT Angio Chest PE W/Cm &/Or Wo Cm  Result Date: 03/03/2019 CLINICAL DATA:  Shortness of breath. EXAM: CT ANGIOGRAPHY CHEST WITH CONTRAST TECHNIQUE: Multidetector CT imaging of the chest was performed using the standard protocol during bolus administration of intravenous contrast. Multiplanar CT image reconstructions and MIPs were obtained to evaluate the vascular anatomy. CONTRAST:  75mL OMNIPAQUE IOHEXOL 350 MG/ML SOLN COMPARISON:  June 28, 2017. FINDINGS: Cardiovascular: Contrast injection is sufficient to demonstrate satisfactory opacification of the pulmonary arteries to the segmental level.There are acute  bilateral pulmonary emboli involving the main right and left pulmonary arteries extending into the bilateral lobar branches primarily within the lower lobes. There is scattered segmental and subsegmental pulmonary emboli involving both the lower and upper lobes. The heart size is normal. There is CT evidence for right heart strain with the RV LV ratio measuring approximately 1.1. Coronary artery calcifications are noted. Thoracic aortic calcifications are noted. Mediastinum/Nodes: --No mediastinal or hilar lymphadenopathy. --No axillary lymphadenopathy. --No supraclavicular lymphadenopathy. --Normal thyroid gland. --The esophagus is unremarkable Lungs/Pleura: There are emphysematous changes bilaterally. There is a stable 8 mm pulmonary nodule in the right lower lobe. There are additional scattered small bilateral pulmonary nodules which appear to be stable from prior study. There is atelectasis at the lung bases. There is a developing airspace opacity at the left lung base. This may represent an infiltrate or evolving pulmonary infarct in the setting of acute pulmonary emboli. The trachea is unremarkable. There is no pneumothorax. There is a small left-sided pleural effusion. Upper Abdomen: No acute abnormality. Musculoskeletal: No chest wall abnormality. No acute or significant osseous findings. There appear to be bilateral cervical ribs, a normal variant. Review of the MIP images confirms the above findings. IMPRESSION: 1. Extensive bilateral pulmonary emboli as detailed above with evidence for right heart strain with an RV/LV ratio measuring 1.1. 2. There is a developing airspace opacity at the left lung base. This may represent an infiltrate or evolving pulmonary infarct in the setting of acute pulmonary emboli. 3. Small left-sided pleural effusion. 4. Stable bilateral pulmonary nodules. Aortic Atherosclerosis (ICD10-I70.0) and Emphysema (ICD10-J43.9). These results were called by telephone at the time of  interpretation on 03/03/2019 at 1:53 am to provider [facility] , who verbally acknowledged these results. Electronically Signed   By: Constance Holster M.D.   On: 03/03/2019 01:55   CT ABDOMEN PELVIS W CONTRAST  Result Date: 03/03/2019 CLINICAL DATA:  Evaluate for IVC. Patient has history of deep venous thrombosis. EXAM: CT ABDOMEN AND PELVIS WITH CONTRAST TECHNIQUE: Multidetector CT imaging of the abdomen and pelvis was performed using the standard protocol following bolus administration of intravenous contrast. CONTRAST:  137mL OMNIPAQUE IOHEXOL 350 MG/ML SOLN COMPARISON:  None. FINDINGS: Lower chest: Bilateral pulmonary emboli are not well visualized on the current examination and are better delineated on CT a chest performed earlier same day. Bibasilar airspace disease, left greater than right, which may reflect atelectasis versus developing pulmonary infarct. Hepatobiliary: No focal liver abnormality is seen. No gallstones, gallbladder wall thickening, or biliary dilatation. Pancreas: Unremarkable. No pancreatic ductal dilatation or surrounding inflammatory changes. Spleen: Normal in size without focal abnormality. Adrenals/Urinary Tract: Adrenal glands are unremarkable. Kidneys are normal, without renal calculi, focal lesion, or hydronephrosis. Bladder is unremarkable. Stomach/Bowel: Stomach is within normal limits. Appendix appears normal. No evidence of bowel wall thickening, distention, or inflammatory changes. Vascular/Lymphatic: Abdominal aortic  atherosclerosis. Normal caliber abdominal aorta. Common femoral vein, internal and external iliac veins and IVC are patent without venous thrombosis. No lymphadenopathy. Reproductive: Prostate is unremarkable. Other: No abdominal wall hernia or abnormality. No abdominopelvic ascites. Musculoskeletal: No acute osseous abnormality. No aggressive osseous lesion. Degenerative disease with disc height loss at L5-S1. Chronic L1 vertebral body compression  fracture. IMPRESSION: 1. Common femoral vein, internal and external iliac veins and IVC are patent without venous thrombosis. 2. Bilateral pulmonary emboli are not well visualized on the current examination and are better delineated on CT a chest performed earlier same day. Bibasilar airspace disease, left greater than right, which may reflect atelectasis versus developing pulmonary infarct. Electronically Signed   By: Kathreen Devoid   On: 03/03/2019 09:55   US Venous Img Lower Bilateral (DVT)  Result Date: 03/03/2019 CLINICAL DATA:  Pulmonary embolism EXAM: BILATERAL LOWER EXTREMITY VENOUS DOPPLER ULTRASOUND TECHNIQUE: Gray-scale sonography with graded compression, as well as color Doppler and duplex ultrasound were performed to evaluate the lower extremity deep venous systems from the level of the common femoral vein and including the common femoral, femoral, profunda femoral, popliteal and calf veins including the posterior tibial, peroneal and gastrocnemius veins when visible. The superficial great saphenous vein was also interrogated. Spectral Doppler was utilized to evaluate flow at rest and with distal augmentation maneuvers in the common femoral, femoral and popliteal veins. COMPARISON:  Left leg ultrasound 06/12/2017 FINDINGS: RIGHT LOWER EXTREMITY Expansile mid level echoes within the right popliteal artery extending into the posterior tibial and 1 of the peroneal veins. Appearance is consistent with recent thrombosis. There is rouleoux formation and relatively blunted respiratory phasicity. LEFT LOWER EXTREMITY Negative for DVT. There is rouleoux formation and relatively blunted respiratory phasicity. IMPRESSION: 1. Positive for DVT on the right with occlusive clot seen in the popliteal, posterior tibial, and peroneal veins. 2. Bilateral slow venous flow and blunted respiratory phasicity, suggest enhanced abdominal imaging to exclude iliac or IVC pathology. Electronically Signed   By: Monte Fantasia  M.D.   On: 03/03/2019 05:14   DG Chest Port 1 View  Result Date: 03/05/2019 CLINICAL DATA:  Shortness of breath EXAM: PORTABLE CHEST 1 VIEW COMPARISON:  March 03, 2019 FINDINGS: There is bibasilar atelectasis. There is a small left pleural effusion. Heart size and pulmonary vascularity are normal. No adenopathy. No bone lesions. IMPRESSION: Apparent bibasilar atelectasis a small left pleural effusion. No frank consolidation. Cardiac silhouette within normal limits. No adenopathy. Electronically Signed   By: Lowella Grip III M.D.   On: 03/05/2019 07:57   ECHOCARDIOGRAM COMPLETE  Result Date: 03/03/2019   ECHOCARDIOGRAM REPORT   Patient Name:   ELMO KOVANDA Date of Exam: 03/03/2019 Medical Rec #:  [id number]        Height:       63.0 in Accession #:    [id number]       Weight:       180.0 lb Date of Birth:  1966/02/11        BSA:          1.85 m Patient Age:    12 years         BP:           141/95 mmHg Patient Gender: M                HR:           83 bpm. Exam Location:  [facility] Procedure: 2D Echo, Cardiac Doppler and Color Doppler Indications:  Pulmonary embolus 415.19  History:         Patient has no prior history of Echocardiogram examinations.                  CAD; Risk Factors:Hypertension. History of stroke, PE.  Sonographer:     Sherrie Sport RDCS (AE) Referring Phys:  [id number] Arvella Merles MANSY Diagnosing Phys: Kathlyn Sacramento MD IMPRESSIONS  1. Left ventricular ejection fraction, by visual estimation, is 55 to 60%. The left ventricle has normal function. There is no left ventricular hypertrophy.  2. The left ventricle has no regional wall motion abnormalities.  3. Global right ventricle has normal systolic function.The right ventricular size is normal. No increase in right ventricular wall thickness.  4. Left atrial size was normal.  5. Right atrial size was normal.  6. The mitral valve is normal in structure. No evidence of mitral valve regurgitation. No evidence of mitral stenosis.  7. The  tricuspid valve is normal in structure. Tricuspid valve regurgitation is trivial.  8. The aortic valve is normal in structure. Aortic valve regurgitation is not visualized. No evidence of aortic valve sclerosis or stenosis.  9. The pulmonic valve was normal in structure. Pulmonic valve regurgitation is not visualized. 10. Normal pulmonary artery systolic pressure. FINDINGS  Left Ventricle: Left ventricular ejection fraction, by visual estimation, is 55 to 60%. The left ventricle has normal function. The left ventricle has no regional wall motion abnormalities. There is no left ventricular hypertrophy. Left ventricular diastolic parameters were normal. Normal left atrial pressure. Right Ventricle: The right ventricular size is normal. No increase in right ventricular wall thickness. Global RV systolic function is has normal systolic function. The tricuspid regurgitant velocity is 2.27 m/s, and with an assumed right atrial pressure  of 5 mmHg, the estimated right ventricular systolic pressure is normal at 25.6 mmHg. Left Atrium: Left atrial size was normal in size. Right Atrium: Right atrial size was normal in size Pericardium: There is no evidence of pericardial effusion. Mitral Valve: The mitral valve is normal in structure. No evidence of mitral valve regurgitation. No evidence of mitral valve stenosis by observation. Tricuspid Valve: The tricuspid valve is normal in structure. Tricuspid valve regurgitation is trivial. Aortic Valve: The aortic valve is normal in structure. Aortic valve regurgitation is not visualized. The aortic valve is structurally normal, with no evidence of sclerosis or stenosis. Aortic valve mean gradient measures 3.5 mmHg. Aortic valve peak gradient measures 6.0 mmHg. Aortic valve area, by VTI measures 3.89 cm. Pulmonic Valve: The pulmonic valve was normal in structure. Pulmonic valve regurgitation is not visualized. Pulmonic regurgitation is not visualized. Aorta: The aortic root, ascending  aorta and aortic arch are all structurally normal, with no evidence of dilitation or obstruction. Venous: The inferior vena cava was not well visualized. IAS/Shunts: No atrial level shunt detected by color flow Doppler. There is no evidence of a patent foramen ovale. No ventricular septal defect is seen or detected. There is no evidence of an atrial septal defect.  LEFT VENTRICLE PLAX 2D LVIDd:         2.76 cm  Diastology LVIDs:         1.73 cm  LV e' lateral:   10.40 cm/s LV PW:         1.02 cm  LV E/e' lateral: 9.1 LV IVS:        0.80 cm  LV e' medial:    6.85 cm/s LVOT diam:     2.00 cm  LV  E/e' medial:  13.8 LV SV:         20 ml LV SV Index:   10.20 LVOT Area:     3.14 cm  RIGHT VENTRICLE RV Basal diam:  2.96 cm RV S prime:     11.40 cm/s TAPSE (M-mode): 4.3 cm LEFT ATRIUM             Index       RIGHT ATRIUM           Index LA diam:        3.30 cm 1.78 cm/m  RA Area:     10.50 cm LA Vol (A2C):   40.1 ml 21.69 ml/m RA Volume:   19.70 ml  10.65 ml/m LA Vol (A4C):   38.2 ml 20.66 ml/m LA Biplane Vol: 40.0 ml 21.63 ml/m  AORTIC VALVE                   PULMONIC VALVE AV Area (Vmax):    2.51 cm    PV Vmax:        0.77 m/s AV Area (Vmean):   2.59 cm    PV Peak grad:   2.3 mmHg AV Area (VTI):     3.89 cm    RVOT Peak grad: 3 mmHg AV Vmax:           122.00 cm/s AV Vmean:          87.600 cm/s AV VTI:            0.214 m AV Peak Grad:      6.0 mmHg AV Mean Grad:      3.5 mmHg LVOT Vmax:         97.50 cm/s LVOT Vmean:        72.200 cm/s LVOT VTI:          0.265 m LVOT/AV VTI ratio: 1.24  AORTA Ao Root diam: 2.70 cm MITRAL VALVE                        TRICUSPID VALVE MV Area (PHT): 3.68 cm             TR Peak grad:   20.6 mmHg MV PHT:        59.74 msec           TR Vmax:        227.00 cm/s MV Decel Time: 206 msec MV E velocity: 94.60 cm/s 103 cm/s  SHUNTS MV A velocity: 75.70 cm/s 70.3 cm/s Systemic VTI:  0.26 m MV E/A ratio:  1.25       1.5       Systemic Diam: 2.00 cm  Kathlyn Sacramento MD Electronically signed by  Kathlyn Sacramento MD Signature Date/Time: 03/03/2019/1:44:51 PM    Final       Subjective: Patient denies any chest pain or shortness of breath.  Denies any fever or chills and feels better.  Discharge Exam: Vitals:   03/07/19 0422 03/07/19 0826  BP: 103/73 116/85  Pulse: 75 67  Resp: 20 18  Temp: 98 F (36.7 C) 98.6 F (37 C)  SpO2: 94% 94%   Vitals:   03/06/19 1543 03/06/19 1922 03/07/19 0422 03/07/19 0826  BP: (!) 141/95 111/70 103/73 116/85  Pulse: 84 90 75 67  Resp: 19 20 20 18   Temp: 98 F (36.7 C) 98 F (36.7 C) 98 F (36.7 C) 98.6 F (37 C)  TempSrc: Oral     SpO2: 97%  93% 94% 94%  Weight:      Height:        General: Pt is alert, awake, not in acute distress Cardiovascular: RRR, S1/S2 +, no rubs, no gallops Respiratory: CTA bilaterally, no wheezing, no rhonchi Abdominal: Soft, NT, ND, bowel sounds + Extremities: no edema, no cyanosis    The results of significant diagnostics from this hospitalization (including imaging, microbiology, ancillary and laboratory) are listed below for reference.     Microbiology: Recent Results (from the past 240 hour(s))  SARS CORONAVIRUS 2 (TAT 6-24 HRS) Nasopharyngeal Nasopharyngeal Swab     Status: None   Collection Time: 03/03/19  1:53 AM   Specimen: Nasopharyngeal Swab  Result Value Ref Range Status   SARS Coronavirus 2 NEGATIVE NEGATIVE Final    Comment: (NOTE) SARS-CoV-2 target nucleic acids are NOT DETECTED. The SARS-CoV-2 RNA is generally detectable in upper and lower respiratory specimens during the acute phase of infection. Negative results do not preclude SARS-CoV-2 infection, do not rule out co-infections with other pathogens, and should not be used as the sole basis for treatment or other patient management decisions. Negative results must be combined with clinical observations, patient history, and epidemiological information. The expected result is Negative. Fact Sheet for  Patients: SugarRoll.be Fact Sheet for Healthcare Providers: https://www.woods-mathews.com/ This test is not yet approved or cleared by the [place] FDA and  has been authorized for detection and/or diagnosis of SARS-CoV-2 by FDA under an Emergency Use Authorization (EUA). This EUA will remain  in effect (meaning this test can be used) for the duration of the COVID-19 declaration under Section 56 4(b)(1) of the Act, 21 U.S.C. section 360bbb-3(b)(1), unless the authorization is terminated or revoked sooner. Performed at [facility], [place] [address] [place], [place] [address]   CULTURE, BLOOD (ROUTINE X 2) w Reflex to ID Panel     Status: None (Preliminary result)   Collection Time: 03/06/19  1:18 PM   Specimen: BLOOD  Result Value Ref Range Status   Specimen Description BLOOD BLOOD RIGHT HAND  Final   Special Requests   Final    BOTTLES DRAWN AEROBIC AND ANAEROBIC Blood Culture adequate volume   Culture   Final    NO GROWTH < 24 HOURS Performed at [facility], [address], [place], [place] [address]    Report Status PENDING  Incomplete  CULTURE, BLOOD (ROUTINE X 2) w Reflex to ID Panel     Status: None (Preliminary result)   Collection Time: 03/06/19  1:23 PM   Specimen: BLOOD  Result Value Ref Range Status   Specimen Description BLOOD RIGHT ANTECUBITAL  Final   Special Requests   Final    BOTTLES DRAWN AEROBIC AND ANAEROBIC Blood Culture adequate volume   Culture   Final    NO GROWTH < 24 HOURS Performed at [facility], [address] [place], [place] [address]    Report Status PENDING  Incomplete  Urine Culture     Status: None   Collection Time: 03/06/19  6:10 PM   Specimen: Urine, Random  Result Value Ref Range Status   Specimen Description   Final    URINE, RANDOM Performed at [facility], [address], [place], [place] [address]    Special Requests   Final    NONE Performed  at [facility], [address], [place], [place] [address]    Culture   Final    NO GROWTH Performed at [facility], [address] [place], [place] [address]    Report Status 03/07/2019 FINAL  Final     Labs: BNP (last 3 results) Recent Labs    03/03/19 0012  BNP [id number]   Basic Metabolic Panel: Recent Labs  Lab 03/03/19 0012 03/03/19 1001 03/04/19 0520 03/05/19 0449 03/07/19 0355  NA 137 134* 134* 134* 134*  K 3.4* 3.9 3.9 3.6 3.7  CL 102 101 104 106 103  CO2 24 23 20* 21* 19*  GLUCOSE 102* 93 96 98 136*  BUN 12 11 13 14 20   CREATININE 1.05 0.92 0.88 0.91 1.04  CALCIUM 9.5 8.7* 8.5* 8.6* 9.0  MG  --  2.1  --   --   --    Liver Function Tests: Recent Labs  Lab 03/03/19 0012  AST 12*  ALT 12  ALKPHOS 104  BILITOT 0.7  PROT 7.9  ALBUMIN 4.1   No results for input(s): LIPASE, AMYLASE in the last 168 hours. No results for input(s): AMMONIA in the last 168 hours. CBC: Recent Labs  Lab 03/03/19 0012 03/03/19 1001 03/04/19 0520 03/05/19 0449 03/06/19 0425 03/07/19 0355  WBC 14.9* 11.9* 12.3* 11.7* 10.9* 10.6*  NEUTROABS 10.2*  --   --   --   --  7.3  HGB 17.1* 15.5 15.5 14.2 14.5 15.0  HCT 49.6 44.6 44.0 41.4 42.0 41.9  MCV 87.9 87.8 86.1 87.9 87.5 84.3  PLT 281 241 244 245 273 304   Cardiac Enzymes: No results for input(s): CKTOTAL, CKMB, CKMBINDEX, TROPONINI in the last 168 hours. BNP: Invalid input(s): POCBNP CBG: No results for input(s): GLUCAP in the last 168 hours. D-Dimer No results for input(s): DDIMER in the last 72 hours. Hgb A1c No results for input(s): HGBA1C in the last 72 hours. Lipid Profile No results for input(s): CHOL, HDL, LDLCALC, TRIG, CHOLHDL, LDLDIRECT in the last 72 hours. Thyroid function studies No results for input(s): TSH, T4TOTAL, T3FREE, THYROIDAB in the last 72 hours.  Invalid input(s): FREET3 Anemia work up No results for input(s): VITAMINB12, FOLATE, FERRITIN, TIBC, IRON, RETICCTPCT in the last  72 hours. Urinalysis No results found for: COLORURINE, APPEARANCEUR, [place], [place], [place], [place], [place], [place], PROTEINUR, UROBILINOGEN, NITRITE, LEUKOCYTESUR Sepsis Labs Invalid input(s): PROCALCITONIN,  WBC,  LACTICIDVEN Microbiology Recent Results (from the past 240 hour(s))  SARS CORONAVIRUS 2 (TAT 6-24 HRS) Nasopharyngeal Nasopharyngeal Swab     Status: None   Collection Time: 03/03/19  1:53 AM   Specimen: Nasopharyngeal Swab  Result Value Ref Range Status   SARS Coronavirus 2 NEGATIVE NEGATIVE Final    Comment: (NOTE) SARS-CoV-2 target nucleic acids are NOT DETECTED. The SARS-CoV-2 RNA is generally detectable in upper and lower respiratory specimens during the acute phase of infection. Negative results do not preclude SARS-CoV-2 infection, do not rule out co-infections with other pathogens, and should not be used as the sole basis for treatment or other patient management decisions. Negative results must be combined with clinical observations, patient history, and epidemiological information. The expected result is Negative. Fact Sheet for Patients: SugarRoll.be Fact Sheet for Healthcare Providers: https://www.woods-mathews.com/ This test is not yet approved or cleared by the [place] FDA and  has been authorized for detection and/or diagnosis of SARS-CoV-2 by FDA under an Emergency Use Authorization (EUA). This EUA will remain  in effect (meaning this test can be used) for the duration of the COVID-19 declaration under Section 56 4(b)(1) of the Act, 21 U.S.C. section 360bbb-3(b)(1), unless the authorization is terminated or revoked sooner.  Performed at [facility], [place] [address] [place], [place] [address]   CULTURE, BLOOD (ROUTINE X 2) w Reflex to ID Panel     Status: None (Preliminary result)   Collection Time: 03/06/19  1:18 PM   Specimen: BLOOD  Result Value Ref Range Status   Specimen Description  BLOOD BLOOD RIGHT HAND  Final   Special Requests   Final    BOTTLES DRAWN AEROBIC AND ANAEROBIC Blood Culture adequate volume   Culture   Final    NO GROWTH < 24 HOURS Performed at [facility], [address], [place], [place] [address]    Report Status PENDING  Incomplete  CULTURE, BLOOD (ROUTINE X 2) w Reflex to ID Panel     Status: None (Preliminary result)   Collection Time: 03/06/19  1:23 PM   Specimen: BLOOD  Result Value Ref Range Status   Specimen Description BLOOD RIGHT ANTECUBITAL  Final   Special Requests   Final    BOTTLES DRAWN AEROBIC AND ANAEROBIC Blood Culture adequate volume   Culture   Final    NO GROWTH < 24 HOURS Performed at [facility], [address] [place], [place] [address]    Report Status PENDING  Incomplete  Urine Culture     Status: None   Collection Time: 03/06/19  6:10 PM   Specimen: Urine, Random  Result Value Ref Range Status   Specimen Description   Final    URINE, RANDOM Performed at [facility], [address], [place], [place] [address]    Special Requests   Final    NONE Performed at [facility], [address], [place], [place] [address]    Culture   Final    NO GROWTH Performed at [facility], [place] [address], [place], [place] [address]    Report Status 03/07/2019 FINAL  Final     Time coordinating discharge: Over 30 minutes  SIGNED:   Nolberto Hanlon, MD  [facility] 03/07/2019, 11:24 AM Pager   If 7PM-7AM, please contact night-coverage [email or web address] Password [email or web address]

## 2019-03-07 NOTE — Progress Notes (Signed)
Discharge instructions reviewed with patient and wife. They verbalized understanding. IVs removed. Stable condition. Patient waiting on NT to wheel him out now.

## 2019-03-07 NOTE — Plan of Care (Signed)
  Problem: Education: Goal: Knowledge of General Education information will improve Description Including pain rating scale, medication(s)/side effects and non-pharmacologic comfort measures Outcome: Progressing   Problem: Activity: Goal: Risk for activity intolerance will decrease Outcome: Progressing   Problem: Safety: Goal: Ability to remain free from injury will improve Outcome: Progressing   

## 2019-03-11 LAB — CULTURE, BLOOD (ROUTINE X 2)
Culture: NO GROWTH
Culture: NO GROWTH
Special Requests: ADEQUATE
Special Requests: ADEQUATE

## 2019-04-14 ENCOUNTER — Other Ambulatory Visit: Payer: Medicare HMO

## 2019-04-14 ENCOUNTER — Other Ambulatory Visit: Payer: Self-pay

## 2019-04-14 ENCOUNTER — Other Ambulatory Visit: Payer: Self-pay | Admitting: Family Medicine

## 2019-04-14 DIAGNOSIS — I2699 Other pulmonary embolism without acute cor pulmonale: Secondary | ICD-10-CM

## 2019-04-14 DIAGNOSIS — Z7901 Long term (current) use of anticoagulants: Secondary | ICD-10-CM

## 2019-04-15 LAB — PROTIME-INR
INR: 1.1 (ref 0.9–1.2)
Prothrombin Time: 11.3 s (ref 9.1–12.0)

## 2019-04-22 DIAGNOSIS — Z86711 Personal history of pulmonary embolism: Secondary | ICD-10-CM

## 2019-04-26 ENCOUNTER — Other Ambulatory Visit: Payer: Self-pay

## 2019-04-26 ENCOUNTER — Encounter: Payer: Self-pay | Admitting: Emergency Medicine

## 2019-04-26 ENCOUNTER — Emergency Department: Payer: Medicare HMO

## 2019-04-26 ENCOUNTER — Emergency Department
Admission: EM | Admit: 2019-04-26 | Discharge: 2019-04-26 | Disposition: A | Discharge status: Home / Self Care | Payer: Medicare HMO | Attending: Emergency Medicine | Admitting: Emergency Medicine

## 2019-04-26 DIAGNOSIS — Z8673 Personal history of transient ischemic attack (TIA), and cerebral infarction without residual deficits: Secondary | ICD-10-CM | POA: Diagnosis not present

## 2019-04-26 DIAGNOSIS — F17210 Nicotine dependence, cigarettes, uncomplicated: Secondary | ICD-10-CM | POA: Insufficient documentation

## 2019-04-26 DIAGNOSIS — I10 Essential (primary) hypertension: Secondary | ICD-10-CM | POA: Insufficient documentation

## 2019-04-26 DIAGNOSIS — W010XXA Fall on same level from slipping, tripping and stumbling without subsequent striking against object, initial encounter: Secondary | ICD-10-CM | POA: Insufficient documentation

## 2019-04-26 DIAGNOSIS — W19XXXA Unspecified fall, initial encounter: Secondary | ICD-10-CM

## 2019-04-26 DIAGNOSIS — I2510 Atherosclerotic heart disease of native coronary artery without angina pectoris: Secondary | ICD-10-CM | POA: Insufficient documentation

## 2019-04-26 DIAGNOSIS — Y92009 Unspecified place in unspecified non-institutional (private) residence as the place of occurrence of the external cause: Secondary | ICD-10-CM

## 2019-04-26 DIAGNOSIS — Y92018 Other place in single-family (private) house as the place of occurrence of the external cause: Secondary | ICD-10-CM | POA: Insufficient documentation

## 2019-04-26 DIAGNOSIS — Y93K9 Activity, other involving animal care: Secondary | ICD-10-CM | POA: Insufficient documentation

## 2019-04-26 DIAGNOSIS — Z7901 Long term (current) use of anticoagulants: Secondary | ICD-10-CM | POA: Diagnosis not present

## 2019-04-26 DIAGNOSIS — Y998 Other external cause status: Secondary | ICD-10-CM | POA: Diagnosis not present

## 2019-04-26 DIAGNOSIS — S299XXA Unspecified injury of thorax, initial encounter: Secondary | ICD-10-CM | POA: Diagnosis present

## 2019-04-26 DIAGNOSIS — Z79899 Other long term (current) drug therapy: Secondary | ICD-10-CM | POA: Diagnosis not present

## 2019-04-26 DIAGNOSIS — S2241XA Multiple fractures of ribs, right side, initial encounter for closed fracture: Secondary | ICD-10-CM | POA: Diagnosis not present

## 2019-04-26 LAB — CBC WITH DIFFERENTIAL/PLATELET
Abs Immature Granulocytes: 0.04 10*3/uL (ref 0.00–0.07)
Basophils Absolute: 0.1 10*3/uL (ref 0.0–0.1)
Basophils Relative: 1 %
Eosinophils Absolute: 0 10*3/uL (ref 0.0–0.5)
Eosinophils Relative: 0 %
HCT: 46.7 % (ref 39.0–52.0)
Hemoglobin: 15.9 g/dL (ref 13.0–17.0)
Immature Granulocytes: 0 %
Lymphocytes Relative: 13 %
Lymphs Abs: 1.8 10*3/uL (ref 0.7–4.0)
MCH: 29.6 pg (ref 26.0–34.0)
MCHC: 34 g/dL (ref 30.0–36.0)
MCV: 86.8 fL (ref 80.0–100.0)
Monocytes Absolute: 0.8 10*3/uL (ref 0.1–1.0)
Monocytes Relative: 6 %
Neutro Abs: 10.5 10*3/uL — ABNORMAL HIGH (ref 1.7–7.7)
Neutrophils Relative %: 80 %
Platelets: 300 10*3/uL (ref 150–400)
RBC: 5.38 MIL/uL (ref 4.22–5.81)
RDW: 13.8 % (ref 11.5–15.5)
WBC: 13.2 10*3/uL — ABNORMAL HIGH (ref 4.0–10.5)
nRBC: 0 % (ref 0.0–0.2)

## 2019-04-26 LAB — URINALYSIS, COMPLETE (UACMP) WITH MICROSCOPIC
Bacteria, UA: NONE SEEN
Bilirubin Urine: NEGATIVE
Glucose, UA: NEGATIVE mg/dL
Hgb urine dipstick: NEGATIVE
Ketones, ur: NEGATIVE mg/dL
Leukocytes,Ua: NEGATIVE
Nitrite: NEGATIVE
Protein, ur: NEGATIVE mg/dL
Specific Gravity, Urine: 1.023 (ref 1.005–1.030)
Squamous Epithelial / HPF: NONE SEEN (ref 0–5)
pH: 6 (ref 5.0–8.0)

## 2019-04-26 LAB — BASIC METABOLIC PANEL
Anion gap: 10 (ref 5–15)
BUN: 18 mg/dL (ref 6–20)
CO2: 23 mmol/L (ref 22–32)
Calcium: 9.3 mg/dL (ref 8.9–10.3)
Chloride: 105 mmol/L (ref 98–111)
Creatinine, Ser: 0.92 mg/dL (ref 0.61–1.24)
GFR calc Af Amer: >60 mL/min (ref >60)
GFR calc non Af Amer: >60 mL/min (ref >60)
Glucose, Bld: 107 mg/dL — ABNORMAL HIGH (ref 70–99)
Potassium: 4 mmol/L (ref 3.5–5.1)
Sodium: 138 mmol/L (ref 135–145)

## 2019-04-26 LAB — PROTIME-INR
INR: 2 — ABNORMAL HIGH (ref 0.8–1.2)
Prothrombin Time: 22.9 seconds — ABNORMAL HIGH (ref 11.4–15.2)

## 2019-04-26 MED ORDER — OXYCODONE-ACETAMINOPHEN 7.5-325 MG PO TABS: 1.0000 | ORAL_TABLET | Freq: Four times a day (QID) | ORAL | 0 refills | Status: DC | PRN

## 2019-04-26 MED ORDER — OXYCODONE-ACETAMINOPHEN 7.5-325 MG PO TABS
1.0000 | ORAL_TABLET | Freq: Once | ORAL | Status: AC
  Administered 2019-04-26: 1 via ORAL
  Filled 2019-04-26: qty 1

## 2019-04-26 NOTE — ED Triage Notes (Signed)
Pt to ED via [facility] from home for fall this morning. Pt slipped and fell when taking his dog out. Pt denies hitting head but states that he hit his right side. Pt has abrasion on the right flank area. Pt is on blood thinners. Pt is in NAD at this time. Pt has hx/o HTN and has not taken his medication this morning.

## 2019-04-26 NOTE — Discharge Instructions (Addendum)
Follow-up with your primary care provider if any continued problems or concerns.  A prescription for Percocet was sent to the pharmacy that you requested at [facility] on [address].  You may use ice to your ribs as needed for discomfort.  Also use a pillow as we discussed.  You will need to follow-up with your primary care provider if any continued pain medication as needed.  Also there was no blood seen in your urine.  If your symptoms worsen or you have urgent concerns you may return to the emergency department over the weekend.

## 2019-04-26 NOTE — ED Provider Notes (Signed)
[facility] Emergency Department Provider Note  ____________________________________________   None    (approximate)  I have reviewed the triage vital signs and the nursing notes.   HISTORY  Chief Complaint Fall   HPI Scott Foley is a 54 y.o. male presents to the ED via EMS after a fall at home this morning.  Patient states that he slipped on ice while taking his dog out.  He states he fell backwards but denies hitting his head or any loss of consciousness.  He states that he hit his right side and continues to have pain over his right posterior ribs.  Patient takes Coumadin and was scheduled for an INR on Monday.  He is unaware of what his lab work has been in the past.  He also has history of hypertension but has not taken any of his medications morning.  He rates his pain as an 8 out of 10.       Past Medical History:  Diagnosis Date  . CAD (coronary artery disease) 2012   [facility] No heart cath, treated medically [facility]  . History of stroke   . Hyperlipemia   . Hypertension   . PE (pulmonary thromboembolism) [facility])     Patient Active Problem List   Diagnosis Date Noted  . Fever   . Pulmonary embolus ([place]) 03/03/2019  . DNR (do not resuscitate) 03/03/2019  . Pulmonary nodules/lesions, multiple 11/25/2012  . Tobacco use disorder 11/25/2012  . CAD (coronary artery disease)   . Hypertension   . Hyperlipemia   . History of stroke     Past Surgical History:  Procedure Laterality Date  . TONSILLECTOMY      Prior to Admission medications   Medication Sig Start Date End Date Taking? Authorizing Provider  clopidogrel (PLAVIX) 75 MG tablet Take 75 mg by mouth daily.    [provider]  lisinopril (PRINIVIL,ZESTRIL) 40 MG tablet Take 40 mg by mouth daily.    [provider]  metoprolol (LOPRESSOR) 50 MG tablet Take 50 mg by mouth 2 (two) times daily.    [provider]  Omega-3 Fatty Acids (FISH OIL) 1000 MG CAPS  Take by mouth 2 (two) times daily.    [provider]  oxyCODONE-acetaminophen (PERCOCET) 7.5-325 MG tablet Take 1 tablet by mouth every 6 (six) hours as needed for severe pain. 04/26/19 04/25/20  Johnn Hai, PA-C  pravastatin (PRAVACHOL) 40 MG tablet Take 40 mg by mouth daily.    [provider]  warfarin (COUMADIN) 6 MG tablet Take 1 tablet (6 mg total) by mouth one time only at 6 PM. 03/07/19   Nolberto Hanlon, MD    Allergies Penicillins  Family History  Problem Relation Age of Onset  . Hypertension Father   . Heart attack Father   . Stroke Father   . Prostate cancer Father   . Hypertension Sister   . Hypertension Brother   . Heart attack Brother   . Lung cancer Sister     Social History Social History   Tobacco Use  . Smoking status: Current Every Day Smoker    Packs/day: 0.50    Types: Cigarettes    Start date: 03/14/1979  . Smokeless tobacco: Never Used  Substance Use Topics  . Alcohol use: No  . Drug use: No    Review of Systems Constitutional: No fever/chills Eyes: No visual changes. ENT: No sore throat. Cardiovascular: Denies chest pain. Respiratory: Denies shortness of breath. Gastrointestinal: No abdominal  pain.  No nausea, no vomiting.  No diarrhea.  No constipation. Genitourinary: Negative for dysuria. Musculoskeletal: Negative for back pain. Skin: Negative for rash. Neurological: Negative for headaches, focal weakness or numbness. ____________________________________________   PHYSICAL EXAM:  VITAL SIGNS: ED Triage Vitals  Enc Vitals Group     BP 04/26/19 0824 (!) 173/116     Pulse Rate 04/26/19 0824 94     Resp 04/26/19 0824 20     Temp 04/26/19 0824 98.7 F (37.1 C)     Temp Source 04/26/19 0824 Oral     SpO2 04/26/19 0824 96 %     Weight 04/26/19 0824 198 lb (89.8 kg)     Height 04/26/19 0824 5\' 3"  (1.6 m)     Head Circumference --      Peak Flow --      Pain Score 04/26/19 0839 8     Pain Loc --      Pain Edu? --       Excl. in [place]? --     Constitutional: Alert and oriented. Well appearing and in no acute distress. Eyes: Conjunctivae are normal. PERRL. EOMI. Head: Atraumatic. Nose: No trauma. Neck: No stridor.  No cervical tenderness on palpation posteriorly. Cardiovascular: Normal rate, regular rhythm. Grossly normal heart sounds.  Good peripheral circulation. Respiratory: Normal respiratory effort.  No retractions. Lungs CTAB.  No gross deformity but ribs right posterior/lateral are moderately tender.  No soft tissue edema is present.  There is an ecchymotic area posteriorly with skin intact.  This area is tender to light palpation. Gastrointestinal: Soft and nontender. No distention. Musculoskeletal: Moves upper and lower extremities without any difficulty.  Patient was ambulatory without any assistance. Neurologic:  Normal speech and language. No gross focal neurologic deficits are appreciated. No gait instability. Skin:  Skin is warm, dry and intact. No rash noted. Psychiatric: Mood and affect are normal. Speech and behavior are normal.  ____________________________________________   LABS (all labs ordered are listed, but only abnormal results are displayed)  Labs Reviewed  URINALYSIS, COMPLETE (UACMP) WITH MICROSCOPIC - Abnormal; Notable for the following components:      Result Value   Color, Urine YELLOW (*)    APPearance CLEAR (*)    All other components within normal limits  CBC WITH DIFFERENTIAL/PLATELET - Abnormal; Notable for the following components:   WBC 13.2 (*)    Neutro Abs 10.5 (*)    All other components within normal limits  BASIC METABOLIC PANEL - Abnormal; Notable for the following components:   Glucose, Bld 107 (*)    All other components within normal limits  PROTIME-INR - Abnormal; Notable for the following components:   Prothrombin Time 22.9 (*)    INR 2.0 (*)    All other components within normal limits    RADIOLOGY   Official radiology report(s): DG Ribs  Unilateral W/Chest Right  Result Date: 04/26/2019 CLINICAL DATA:  Status post fall. EXAM: RIGHT RIBS AND CHEST - 3+ VIEW COMPARISON:  03/06/2019 FINDINGS: Normal heart size. Lung volumes are low and there is bibasilar atelectasis. Fractures involving the posterior aspect of the right posterior ninth and tenth ribs identified. No pneumothorax or pleural effusion identified IMPRESSION: Right posterior ninth and tenth rib fractures. Low lung volumes and bibasilar atelectasis. Electronically Signed   By: Kerby Moors M.D.   On: 04/26/2019 10:19    ____________________________________________   PROCEDURES  Procedure(s) performed (including Critical Care):  Procedures   ____________________________________________   INITIAL IMPRESSION / ASSESSMENT AND PLAN /  ED COURSE  As part of my medical decision making, I reviewed the following data within the electronic MEDICAL RECORD NUMBER Notes from prior ED visits and [place] Controlled Substance Database  54 year old male presents to the ED via EMS after falling at home on icy steps.  Patient states that he has pain over his right posterior ribs and also has an abrasion there.  Patient currently is taking Coumadin but is unsure where his INRs run.  He denied any head injury or loss of consciousness.  X-ray shows to right posterior rib fractures ninth and 10th.  Patient was given Percocet while in the ED and was comfortable prior to discharge.  Patient was made aware that he does need to follow-up with his primary care provider.  A prescription for continued pain medication was sent to his pharmacy.  He is to return to the emergency department if he has any severe worsening of his symptoms or urgent concerns.  ____________________________________________   FINAL CLINICAL IMPRESSION(S) / ED DIAGNOSES  Final diagnoses:  Closed fracture of multiple ribs of right side, initial encounter  Fall in home, initial encounter     ED Discharge Orders          Ordered    oxyCODONE-acetaminophen (PERCOCET) 7.5-325 MG tablet  Every 6 hours PRN     04/26/19 1028           Note:  This document was prepared using Dragon voice recognition software and may include unintentional dictation errors.    Johnn Hai, PA-C 04/26/19 1323    Lavonia Drafts, MD 04/26/19 [phone]

## 2019-04-28 ENCOUNTER — Ambulatory Visit (INDEPENDENT_AMBULATORY_CARE_PROVIDER_SITE_OTHER): Payer: Medicare HMO | Admitting: Family Medicine

## 2019-04-28 ENCOUNTER — Other Ambulatory Visit: Payer: Self-pay

## 2019-04-28 ENCOUNTER — Encounter: Payer: Self-pay | Admitting: Family Medicine

## 2019-04-28 VITALS — BP 136/98 | HR 72 | Temp 97.7°F | Resp 16 | Ht 67.0 in | Wt 194.6 lb

## 2019-04-28 DIAGNOSIS — Z7901 Long term (current) use of anticoagulants: Secondary | ICD-10-CM

## 2019-04-28 DIAGNOSIS — F17200 Nicotine dependence, unspecified, uncomplicated: Secondary | ICD-10-CM

## 2019-04-28 DIAGNOSIS — Z720 Tobacco use: Secondary | ICD-10-CM

## 2019-04-28 DIAGNOSIS — S2239XA Fracture of one rib, unspecified side, initial encounter for closed fracture: Secondary | ICD-10-CM | POA: Insufficient documentation

## 2019-04-28 DIAGNOSIS — W001XXA Fall from stairs and steps due to ice and snow, initial encounter: Secondary | ICD-10-CM

## 2019-04-28 DIAGNOSIS — S2241XA Multiple fractures of ribs, right side, initial encounter for closed fracture: Secondary | ICD-10-CM

## 2019-04-28 DIAGNOSIS — J9811 Atelectasis: Secondary | ICD-10-CM | POA: Diagnosis not present

## 2019-04-28 HISTORY — DX: Fracture of one rib, unspecified side, initial encounter for closed fracture: S22.39XA

## 2019-04-28 MED ORDER — APIXABAN 5 MG PO TABS: 5.0000 mg | ORAL_TABLET | Freq: Two times a day (BID) | ORAL | 2 refills | Status: DC

## 2019-04-28 MED ORDER — LISINOPRIL 40 MG PO TABS: 40.0000 mg | ORAL_TABLET | Freq: Every day | ORAL | 0 refills | Status: DC

## 2019-04-28 NOTE — Assessment & Plan Note (Signed)
Continue oxycodone as needed for rib fracture pain. Patient encouraged to take OTC stool softener to prevent constipation side effects. He was also to avoid strenuous activity. He was encouraged to rest with periods of walking throughout the day.

## 2019-04-28 NOTE — Assessment & Plan Note (Signed)
Bibasilar atelectasis was found per x-ray on 04/26/19. Scott Foley has a history of COPD, pneumonia, and cigarette smoking since age 54. He was encouraged to deep breathe and cough while splinting ribs with pillow to avoid progression to pneumonia.

## 2019-04-28 NOTE — Assessment & Plan Note (Signed)
Raja's blood pressure today is 136/98. He is c/o 8/10 pain from multiple rib fractures. Will continue Lisinopril 40mg  oral daily.

## 2019-04-28 NOTE — Progress Notes (Signed)
Established Patient Office Visit  Subjective:  Patient ID: Scott Foley, male    DOB: July 05, 1965  Age: 54 y.o. MRN: [id number]  CC:  Chief Complaint  Patient presents with  . Rib Fracture    HPI Scott Foley presents for hospital follow-up after fracturing two ribs 2-days ago. He states he fell down icy steps on Saturday, February 13th, 2021. He denieHs LOC or memory loss. He presented to [facility] where rib fractures were confirmed by x-ray. He is on long-term anticoagulant therapy for history of  PE and DVT.  Past Medical History:  Diagnosis Date  . CAD (coronary artery disease) 2012   [facility] No heart cath, treated medically [facility]  . History of stroke   . Hyperlipemia   . Hypertension   . PE (pulmonary thromboembolism) ([place])     Past Surgical History:  Procedure Laterality Date  . TONSILLECTOMY      Family History  Problem Relation Age of Onset  . Hypertension Father   . Heart attack Father   . Stroke Father   . Prostate cancer Father   . Hypertension Sister   . Hypertension Brother   . Heart attack Brother   . Lung cancer Sister     Social History   Socioeconomic History  . Marital status: Married    Spouse name: Not on file  . Number of children: Not on file  . Years of education: Not on file  . Highest education level: Not on file  Occupational History  . Not on file  Tobacco Use  . Smoking status: Current Every Day Smoker    Packs/day: 0.50    Types: Cigarettes    Start date: 03/14/1979  . Smokeless tobacco: Never Used  Substance and Sexual Activity  . Alcohol use: No  . Drug use: No  . Sexual activity: Not on file  Other Topics Concern  . Not on file  Social History Narrative  . Not on file   Social Determinants of Health   Financial Resource Strain:   . Difficulty of Paying Living Expenses: Not on file  Food Insecurity:   . Worried About Charity fundraiser in the Last Year: Not on file  . Ran Out of  Food in the Last Year: Not on file  Transportation Needs:   . Lack of Transportation (Medical): Not on file  . Lack of Transportation (Non-Medical): Not on file  Physical Activity:   . Days of Exercise per Week: Not on file  . Minutes of Exercise per Session: Not on file  Stress:   . Feeling of Stress : Not on file  Social Connections:   . Frequency of Communication with Friends and Family: Not on file  . Frequency of Social Gatherings with Friends and Family: Not on file  . Attends Religious Services: Not on file  . Active Member of Clubs or Organizations: Not on file  . Attends Archivist Meetings: Not on file  . Marital Status: Not on file  Intimate Partner Violence:   . Fear of Current or Ex-Partner: Not on file  . Emotionally Abused: Not on file  . Physically Abused: Not on file  . Sexually Abused: Not on file    Outpatient Medications Prior to Visit  Medication Sig Dispense Refill  . clopidogrel (PLAVIX) 75 MG tablet Take 75 mg by mouth daily.    . metoprolol (LOPRESSOR) 50 MG tablet Take 50 mg by mouth 2 (two) times daily.    Marland Kitchen  Omega-3 Fatty Acids (FISH OIL) 1000 MG CAPS Take by mouth 2 (two) times daily.    Marland Kitchen oxyCODONE-acetaminophen (PERCOCET) 7.5-325 MG tablet Take 1 tablet by mouth every 6 (six) hours as needed for severe pain. 20 tablet 0  . pravastatin (PRAVACHOL) 40 MG tablet Take 40 mg by mouth daily.    Marland Kitchen lisinopril (PRINIVIL,ZESTRIL) 40 MG tablet Take 40 mg by mouth daily.    Marland Kitchen warfarin (COUMADIN) 6 MG tablet Take 1 tablet (6 mg total) by mouth one time only at 6 PM. 14 tablet 0   No facility-administered medications prior to visit.    Allergies  Allergen Reactions  . Penicillin G Hives    ROS Review of Systems  Constitutional: Negative for chills and fever.  HENT: Negative for congestion.   Respiratory: Negative for shortness of breath.   Cardiovascular: Negative for chest pain and palpitations.  Musculoskeletal: Negative for arthralgias and  back pain.       Right flank/torso pain 8/10 s/p rib fracture  Skin:       Bruising to right flank area.   Neurological: Negative for dizziness and headaches.  Psychiatric/Behavioral: Negative for sleep disturbance.      Objective:    Physical Exam  Constitutional: He is oriented to person, place, and time. He appears well-developed.  HENT:  Head: Normocephalic.  Eyes: Pupils are equal, round, and reactive to light.  Cardiovascular: Normal rate and regular rhythm.  Pulmonary/Chest: Effort normal and breath sounds normal.  Abdominal: Soft. Bowel sounds are normal.  Musculoskeletal:        General: Tenderness present.     Cervical back: Neck supple.     Comments: Tenderness to right flank and posterior trunk with movement and palpation.   Neurological: He is alert and oriented to person, place, and time.  Skin: Skin is warm and dry.  Multiple areas of ecchymosis noted to right flank and posterior torso  Psychiatric: He has a normal mood and affect.    BP (!) 136/98 (BP Location: Left Arm, Patient Position: Sitting, Cuff Size: Large)   Pulse 72   Temp 97.7 F (36.5 C)   Resp 16   Ht 5\' 7"  (1.702 m)   Wt 194 lb 9.6 oz (88.3 kg)   BMI 30.48 kg/m  Wt Readings from Last 3 Encounters:  04/28/19 194 lb 9.6 oz (88.3 kg)  04/26/19 198 lb (89.8 kg)  03/06/19 185 lb 1.6 oz (84 kg)     Health Maintenance Due  Topic Date Due  . HIV Screening  05/07/1979  . TETANUS/TDAP  05/07/1983  . COLONOSCOPY  05/06/2014  . INFLUENZA VACCINE  10/12/2018    There are no preventive care reminders to display for this patient.  No results found for: TSH Lab Results  Component Value Date   WBC 13.2 (H) 04/26/2019   HGB 15.9 04/26/2019   HCT 46.7 04/26/2019   MCV 86.8 04/26/2019   PLT 300 04/26/2019   Lab Results  Component Value Date   NA 138 04/26/2019   K 4.0 04/26/2019   CO2 23 04/26/2019   GLUCOSE 107 (H) 04/26/2019   BUN 18 04/26/2019   CREATININE 0.92 04/26/2019   BILITOT  0.7 03/03/2019   ALKPHOS 104 03/03/2019   AST 12 (L) 03/03/2019   ALT 12 03/03/2019   PROT 7.9 03/03/2019   ALBUMIN 4.1 03/03/2019   CALCIUM 9.3 04/26/2019   ANIONGAP 10 04/26/2019   No results found for: CHOL No results found for: HDL No results found  for: [place] No results found for: TRIG No results found for: CHOLHDL No results found for: HGBA1C    Assessment & Plan:  1. Closed fracture of multiple ribs of right side, initial encounter Acute right posterior 9th and 10th rib fractures s/p fall down icy step. Plan: Rest with periods of walking. Continue oxycodone for pain as needed. Encouraged fluid, high fiber diet, stool softener twice daily to prevent constipation while taking percocet.    2. Atelectasis, bilateral Acute atelectasis noted on chest x-ray from [place]: Encouraged deep breathing and coughing. Scott Foley and his spouse stated they have an incentive spirometer at home from previous hospitalization. Encouraged to perform incentive spirometer exercises hourly while awake. Understanding verbalized.   3. Anticoagulant long-term use Chronic anticoagulant use for multiple thrombosis. Coumadin 3mg  daily. INR 2.0 on 05/06/2019. Plan: Discontinue Coumadin. Begin Eliquis 5 mg twice daily.   4.Tobacco abuse Chronic cigarette smoker for 40 pack years. Scott Foley states he has decreased from 1 pack daily to 5 cigarettes daily.   Plan: Smoking cessation counseling and education provided including health benefits. Scott Foley declines assistance with smoking cessation at this time. Will continue to encourage cessation.      Problem List Items Addressed This Visit      Respiratory   Atelectasis, bilateral    Bibasilar atelectasis was found per x-ray on 04/26/19. Scott Foley has a history of COPD, pneumonia, and cigarette smoking since age 43. He was encouraged to deep breathe and cough while splinting ribs with pillow to avoid progression to pneumonia.         Musculoskeletal and Integument   Closed rib fracture right 9th and 10th - Primary    Continue oxycodone as needed for rib fracture pain. Patient encouraged to take OTC stool softener to prevent constipation side effects. He was also to avoid strenuous activity. He was encouraged to rest with periods of walking throughout the day.         Other   Tobacco use disorder   Tobacco abuse    Scott Foley has decreased cigarette smoking to 5/daily. He was offered assistance with smoking cessation. He declines at this time.      Anticoagulant long-term use    Scott Foley has been on warfarin 3mg  long-term. His current INR per labs from [facility] on 04/26/19 was 2.0. He was given Eliquis 5mg  twice daily samples today. He was instructed to stop Coumadin. Scott Foley and his wife verbalized understanding of these instructions.          Meds ordered this encounter  Medications  . apixaban (ELIQUIS) 5 MG TABS tablet    Sig: Take 1 tablet (5 mg total) by mouth 2 (two) times daily.    Dispense:  60 tablet    Refill:  2  . lisinopril (ZESTRIL) 40 MG tablet    Sig: Take 1 tablet (40 mg total) by mouth daily.    Dispense:  90 tablet    Refill:  0    Follow-up: Return in about 3 months (around 07/26/2019), or if symptoms worsen or fail to improve.    [name], MD

## 2019-04-28 NOTE — Patient Instructions (Signed)
Steps to Quit Smoking Smoking tobacco is the leading cause of preventable death. It can affect almost every organ in the body. Smoking puts you and people around you at risk for many serious, long-lasting (chronic) diseases. Quitting smoking can be hard, but it is one of the best things that you can do for your health. It is never too late to quit. How do I get ready to quit? When you decide to quit smoking, make a plan to help you succeed. Before you quit:  Pick a date to quit. Set a date within the next 2 weeks to give you time to prepare.  Write down the reasons why you are quitting. Keep this list in places where you will see it often.  Tell your family, friends, and co-workers that you are quitting. Their support is important.  Talk with your doctor about the choices that may help you quit.  Find out if your health insurance will pay for these treatments.  Know the people, places, things, and activities that make you want to smoke (triggers). Avoid them. What first steps can I take to quit smoking?  Throw away all cigarettes at home, at work, and in your car.  Throw away the things that you use when you smoke, such as ashtrays and lighters.  Clean your car. Make sure to empty the ashtray.  Clean your home, including curtains and carpets. What can I do to help me quit smoking? Talk with your doctor about taking medicines and seeing a counselor at the same time. You are more likely to succeed when you do both.  If you are pregnant or breastfeeding, talk with your doctor about counseling or other ways to quit smoking. Do not take medicine to help you quit smoking unless your doctor tells you to do so. To quit smoking: Quit right away  Quit smoking totally, instead of slowly cutting back on how much you smoke over a period of time.  Go to counseling. You are more likely to quit if you go to counseling sessions regularly. Take medicine You may take medicines to help you quit. Some  medicines need a prescription, and some you can buy over-the-counter. Some medicines may contain a drug called nicotine to replace the nicotine in cigarettes. Medicines may:  Help you to stop having the desire to smoke (cravings).  Help to stop the problems that come when you stop smoking (withdrawal symptoms). Your doctor may ask you to use:  Nicotine patches, gum, or lozenges.  Nicotine inhalers or sprays.  Non-nicotine medicine that is taken by mouth. Find resources Find resources and other ways to help you quit smoking and remain smoke-free after you quit. These resources are most helpful when you use them often. They include:  Online chats with a counselor.  Phone quitlines.  Printed self-help materials.  Support groups or group counseling.  Text messaging programs.  Mobile phone apps. Use apps on your mobile phone or tablet that can help you stick to your quit plan. There are many free apps for mobile phones and tablets as well as websites. Examples include Quit Guide from the CDC and smokefree.gov  What things can I do to make it easier to quit?   Talk to your family and friends. Ask them to support and encourage you.  Call a phone quitline (1-800-QUIT-NOW), reach out to support groups, or work with a counselor.  Ask people who smoke to not smoke around you.  Avoid places that make you want to smoke,   such as: ? Bars. ? Parties. ? Smoke-break areas at work.  Spend time with people who do not smoke.  Lower the stress in your life. Stress can make you want to smoke. Try these things to help your stress: ? Getting regular exercise. ? Doing deep-breathing exercises. ? Doing yoga. ? Meditating. ? Doing a body scan. To do this, close your eyes, focus on one area of your body at a time from head to toe. Notice which parts of your body are tense. Try to relax the muscles in those areas. How will I feel when I quit smoking? Day 1 to 3 weeks Within the first 24 hours,  you may start to have some problems that come from quitting tobacco. These problems are very bad 2-3 days after you quit, but they do not often last for more than 2-3 weeks. You may get these symptoms:  Mood swings.  Feeling restless, nervous, angry, or annoyed.  Trouble concentrating.  Dizziness.  Strong desire for high-sugar foods and nicotine.  Weight gain.  Trouble pooping (constipation).  Feeling like you may vomit (nausea).  Coughing or a sore throat.  Changes in how the medicines that you take for other issues work in your body.  Depression.  Trouble sleeping (insomnia). Week 3 and afterward After the first 2-3 weeks of quitting, you may start to notice more positive results, such as:  Better sense of smell and taste.  Less coughing and sore throat.  Slower heart rate.  Lower blood pressure.  Clearer skin.  Better breathing.  Fewer sick days. Quitting smoking can be hard. Do not give up if you fail the first time. Some people need to try a few times before they succeed. Do your best to stick to your quit plan, and talk with your doctor if you have any questions or concerns. Summary  Smoking tobacco is the leading cause of preventable death. Quitting smoking can be hard, but it is one of the best things that you can do for your health.  When you decide to quit smoking, make a plan to help you succeed.  Quit smoking right away, not slowly over a period of time.  When you start quitting, seek help from your doctor, family, or friends. This information is not intended to replace advice given to you by your health care provider. Make sure you discuss any questions you have with your health care provider. Document Revised: 11/22/2018 Document Reviewed: 05/18/2018 Elsevier Patient Education  [place]. Rib Fracture  A rib fracture is a break or crack in one of the bones of the ribs. The ribs are like a cage that goes around your upper chest. A broken  or cracked rib is often painful, but most do not cause other problems. Most rib fractures usually heal on their own in 1-3 months. Follow these instructions at home: Managing pain, stiffness, and swelling  If directed, apply ice to the injured area. ? Put ice in a plastic bag. ? Place a towel between your skin and the bag. ? Leave the ice on for 20 minutes, 2-3 times a day.  Take over-the-counter and prescription medicines only as told by your doctor. Activity  Avoid activities that cause pain to the injured area. Protect your injured area.  Slowly increase activity as told by your doctor. General instructions  Do deep breathing as told by your doctor. You may be told to: ? Take deep breaths many times a day. ? Cough many times a day  while hugging a pillow. ? Use a device (incentive spirometer) to do deep breathing many times a day.  Drink enough fluid to keep your pee (urine) clear or pale yellow.  Do not wear a rib belt or binder. These do not allow you to breathe deeply.  Keep all follow-up visits as told by your doctor. This is important. Contact a doctor if:  You have a fever. Get help right away if:  You have trouble breathing.  You are short of breath.  You cannot stop coughing.  You cough up thick or bloody spit (sputum).  You feel sick to your stomach (nauseous), throw up (vomit), or have belly (abdominal) pain.  Your pain gets worse and medicine does not help. Summary  A rib fracture is a break or crack in one of the bones of the ribs.  Apply ice to the injured area and take medicines for pain as told by your doctor.  Take deep breaths and cough many times a day. Hug a pillow every time you cough. This information is not intended to replace advice given to you by your health care provider. Make sure you discuss any questions you have with your health care provider. Document Revised: 02/09/2017 Document Reviewed: 05/30/2016 Elsevier Patient Education   [employer]. an oral tablets What is this medicine? APIXABAN (a PIX a ban) is an anticoagulant (blood thinner). It is used to lower the chance of stroke in people with a medical condition called atrial fibrillation. It is also used to treat or prevent blood clots in the lungs or in the veins. This medicine may be used for other purposes; ask your health care provider or pharmacist if you have questions. COMMON BRAND NAME(S): Eliquis What should I tell my health care provider before I take this medicine? They need to know if you have any of these conditions:  antiphospholipid antibody syndrome  bleeding disorders  bleeding in the brain  blood in your stools (black or tarry stools) or if you have blood in your vomit  history of blood clots  history of stomach bleeding  kidney disease  liver disease  mechanical heart valve  an unusual or allergic reaction to apixaban, other medicines, foods, dyes, or preservatives  pregnant or trying to get pregnant  breast-feeding How should I use this medicine? Take this medicine by mouth with a glass of water. Follow the directions on the prescription label. You can take it with or without food. If it upsets your stomach, take it with food. Take your medicine at regular intervals. Do not take it more often than directed. Do not stop taking except on your doctor's advice. Stopping this medicine may increase your risk of a blood clot. Be sure to refill your prescription before you run out of medicine. Talk to your pediatrician regarding the use of this medicine in children. Special care may be needed. Overdosage: If you think you have taken too much of this medicine contact a poison control center or emergency room at once. NOTE: This medicine is only for you. Do not share this medicine with others. What if I miss a dose? If you miss a dose, take it as soon as you can. If it is almost time for your next dose, take only that dose. Do not take  double or extra doses. What may interact with this medicine? This medicine may interact with the following:  aspirin and aspirin-like medicines  certain medicines for fungal infections like ketoconazole and itraconazole  certain  medicines for seizures like carbamazepine and phenytoin  certain medicines that treat or prevent blood clots like warfarin, enoxaparin, and dalteparin  clarithromycin  NSAIDs, medicines for pain and inflammation, like ibuprofen or naproxen  rifampin  ritonavir  St. John's wort This list may not describe all possible interactions. Give your health care provider a list of all the medicines, herbs, non-prescription drugs, or dietary supplements you use. Also tell them if you smoke, drink alcohol, or use illegal drugs. Some items may interact with your medicine. What should I watch for while using this medicine? Visit your healthcare professional for regular checks on your progress. You may need blood work done while you are taking this medicine. Your condition will be monitored carefully while you are receiving this medicine. It is important not to miss any appointments. Avoid sports and activities that might cause injury while you are using this medicine. Severe falls or injuries can cause unseen bleeding. Be careful when using sharp tools or knives. Consider using an Copy. Take special care brushing or flossing your teeth. Report any injuries, bruising, or red spots on the skin to your healthcare professional. If you are going to need surgery or other procedure, tell your healthcare professional that you are taking this medicine. Wear a medical ID bracelet or chain. Carry a card that describes your disease and details of your medicine and dosage times. What side effects may I notice from receiving this medicine? Side effects that you should report to your doctor or health care professional as soon as possible:  allergic reactions like skin rash, itching  or hives, swelling of the face, lips, or tongue  signs and symptoms of bleeding such as bloody or black, tarry stools; red or dark-brown urine; spitting up blood or brown material that looks like coffee grounds; red spots on the skin; unusual bruising or bleeding from the eye, gums, or nose  signs and symptoms of a blood clot such as chest pain; shortness of breath; pain, swelling, or warmth in the leg  signs and symptoms of a stroke such as changes in vision; confusion; trouble speaking or understanding; severe headaches; sudden numbness or weakness of the face, arm or leg; trouble walking; dizziness; loss of coordination This list may not describe all possible side effects. Call your doctor for medical advice about side effects. You may report side effects to FDA at 1-800-FDA-1088. Where should I keep my medicine? Keep out of the reach of children. Store at room temperature between 20 and 25 degrees C (68 and 77 degrees F). Throw away any unused medicine after the expiration date. NOTE: This sheet is a summary. It may not cover all possible information. If you have questions about this medicine, talk to your doctor, pharmacist, or health care provider.  2020 Elsevier/Gold Standard (2017-11-07 17:39:34) Atelectasis, Adult  Atelectasis is a collapse of air sacs in the lungs (alveoli). The condition causes all or part of a lung to collapse. Atelectasis is a common problem after surgery. Its severity depends on the size of lung tissue area involved and the underlying cause. When severe, it can lead to shortness of breath and heart problems. Atelectasis can develop suddenly or over a long period of time. Atelectasis that develops over a long period of time (chronic atelectasis) often leads to infection, scarring, and other problems. What are the causes? This condition may be caused by:  Shallow breathing.  Medicines that make breathing more shallow.  A blockage in an airway. Blockages can  result  from: ? A buildup of mucus. ? A tumor. ? An inhaled object (foreign body). ? Enlarged lymph nodes. ? Fluid in the lungs (pleural effusion). ? A blood clot in the lungs.  Outside pressure on the lung. Pressure can be due to: ? A tumor. ? Fluid in the lungs (pleural effusion). ? Air leaking between the lung and rib cage (pneumothorax). ? Enlarged lymph nodes.  Improper expansion of the lungs. This may occur in newborns because of: ? Prematurity. ? Low oxygen levels. ? Secretions at birth that block the airway. ? Amniotic fluid that goes into the lungs (aspiration). What increases the risk? This condition is more likely to develop in people who:  Have an injury or health problem that makes taking deep breaths difficult or painful.  Have certain infections or diseases, such as pneumonia or cystic fibrosis.  Have had surgery on the chest or abdomen.  Have broken ribs.  Have a tight bandage around their chest.  Have a collapsed lung due to pneumothorax.  Take medicines that decrease the rate of their breathing or how deeply they breathe, like sedatives.  Lie flat for long periods of time. What are the signs or symptoms? Often, there are no symptoms for this condition. When symptoms do appear, they may include:  Shortness of breath.  Bluish color to the nails, lips, or mouth (cyanosis).  A cough. How is this diagnosed? This condition may be diagnosed based on:  Symptoms.  A physical exam.  A chest X-ray. Sometimes specialized imaging tests are needed to diagnose the condition. How is this treated? Treatment for this condition depends on what caused the condition. Treatment may involve:  Coughing. Coughing helps loosen mucus in the airway.  Chest physiotherapy. This is a treatment to help loosen and clear mucus from the airways. It is done by clapping the chest.  Postural drainage techniques. This treatment involves positioning your body so your head is  lower than your chest. It helps mucus drain from your airways.  An incentive spirometer. This is a device that is used to help with taking deeper breaths.  Positive pressure breathing. This is a form of breathing assistance in which air is forced into the lungs when you breathe in (inhale). You may have this treatment if your condition is severe.  Treatment of the underlying condition. Follow these instructions at home:  Take over-the-counter and prescription medicines only as told by your health care provider.  Practice taking relaxed and deep breaths when you are sitting. A good time to practice is when you are watching TV. Take a few deep breaths during each commercial break.  Make sure to lie on your unaffected side when you are lying down. For example, if you have atelectasis in your left lung, lie on your right side. This will help mucus drain from your airway.  Cough several times a day as told by your health care provider.  Perform chest physiotherapy or postural drainage techniques as told by your health care provider. If necessary, have someone help you.  If you were given a device to help with breathing, use it as told by your health care provider.  Stay as active as possible. Get help right away if:  Your breathing problems get worse.  You have severe chest pain.  You develop severe coughing.  You cough up blood.  You have a fever.  You have persistent symptoms for more than 2-3 days.  Your symptoms suddenly get worse. This information is not intended  to replace advice given to you by your health care provider. Make sure you discuss any questions you have with your health care provider. Document Revised: 02/09/2017 Document Reviewed: 08/02/2015 Elsevier Patient Education  2020 [employer].

## 2019-04-28 NOTE — Assessment & Plan Note (Signed)
Scott Foley has decreased cigarette smoking to 5/daily. He was offered assistance with smoking cessation. He declines at this time.

## 2019-04-28 NOTE — Assessment & Plan Note (Signed)
Scott Foley has been on warfarin 3mg  long-term. His current INR per labs from [facility] on 04/26/19 was 2.0. He was given Eliquis 5mg  twice daily samples today. He was instructed to stop Coumadin. Scott Foley and his wife verbalized understanding of these instructions.

## 2019-05-09 ENCOUNTER — Other Ambulatory Visit: Payer: Self-pay | Admitting: Family Medicine

## 2019-05-22 DIAGNOSIS — W001XXA Fall from stairs and steps due to ice and snow, initial encounter: Secondary | ICD-10-CM | POA: Insufficient documentation

## 2019-05-22 NOTE — Progress Notes (Signed)
February 13th, 2021. Fall on ice down stairs.

## 2019-07-23 NOTE — Progress Notes (Signed)
Subjective:  Patient ID: Scott Foley, male    DOB: Sep 07, 1965  Age: 54 y.o. MRN: [id number]  Chief Complaint  Patient presents with  . Hypertension  . Hyperlipidemia    HPI Patient is a 54 year old white male who presents for routine follow-up.  He is afflicted with hypertension, hyperlipidemia, coronary artery disease, history of pulmonary embolism, and stroke.  He is a former smoker but does continue to vape.  He eats fairly healthy but is does not exercise.  He denies any chest pain or shortness of breath.  He currently is taking metoprolol and lisinopril for his blood.  He is currently on pravastatin and fish oil for his cholesterol.  He is on Eliquis for his pulmonary embolism.  He currently also is taking Plavix for a stroke he had in 2014. He has some confusion and mental disability from this stroke.   Past Medical History:  Diagnosis Date  . CAD (coronary artery disease) 2012   [facility] No heart cath, treated medically [facility]  . History of stroke   . Hyperlipemia   . Hypertension   . PE (pulmonary thromboembolism) ([place])    Past Surgical History:  Procedure Laterality Date  . TONSILLECTOMY      Family History  Problem Relation Age of Onset  . Hypertension Father   . Heart attack Father   . Stroke Father   . Prostate cancer Father   . Hypertension Sister   . Hypertension Brother   . Heart attack Brother   . Lung cancer Sister    Social History   Socioeconomic History  . Marital status: Married    Spouse name: Not on file  . Number of children: Not on file  . Years of education: Not on file  . Highest education level: Not on file  Occupational History  . Not on file  Tobacco Use  . Smoking status: Former Smoker    Packs/day: 0.50    Types: Cigarettes    Start date: 03/14/1979    Quit date: 07/28/2018    Years since quitting: 1.0  . Smokeless tobacco: Never Used  Substance and Sexual Activity  . Alcohol use: No  . Drug use: No  . Sexual activity: Not  on file  Other Topics Concern  . Not on file  Social History Narrative  . Not on file   Social Determinants of Health   Financial Resource Strain:   . Difficulty of Paying Living Expenses:   Food Insecurity:   . Worried About Charity fundraiser in the Last Year:   . Arboriculturist in the Last Year:   Transportation Needs:   . Film/video editor (Medical):   Marland Kitchen Lack of Transportation (Non-Medical):   Physical Activity:   . Days of Exercise per Week:   . Minutes of Exercise per Session:   Stress:   . Feeling of Stress :   Social Connections:   . Frequency of Communication with Friends and Family:   . Frequency of Social Gatherings with Friends and Family:   . Attends Religious Services:   . Active Member of Clubs or Organizations:   . Attends Archivist Meetings:   Marland Kitchen Marital Status:     Review of Systems  Constitutional: Negative for chills, diaphoresis, fatigue and fever.  HENT: Negative for congestion, ear pain and sore throat.   Respiratory: Negative for cough and shortness of breath.   Cardiovascular: Negative for chest pain and leg swelling.  Gastrointestinal:  Negative for abdominal pain, constipation, diarrhea, nausea and vomiting.  Endocrine: Negative for polydipsia, polyphagia and polyuria.  Genitourinary: Negative for dysuria and urgency.  Musculoskeletal: Negative for arthralgias and myalgias.  Neurological: Negative for dizziness and headaches.  Psychiatric/Behavioral: Negative for dysphoric mood.       No anhedonia.     Objective:  BP 138/78   Pulse 71   Temp 98 F (36.7 C)   Ht 5\' 4"  (1.626 m)   Wt 196 lb (88.9 kg)   SpO2 100%   BMI 33.64 kg/m   BP/Weight 07/28/2019 04/28/2019 [id number]  Systolic BP 0000000 [id number]  Diastolic BP 78 98 96  Wt. (Lbs) 196 194.6 198  BMI 33.64 30.48 35.07    Physical Exam Vitals reviewed.  Constitutional:      Appearance: Normal appearance. He is normal weight.  Cardiovascular:     Rate and Rhythm:  Normal rate and regular rhythm.  Pulmonary:     Effort: Pulmonary effort is normal.     Breath sounds: Normal breath sounds.  Abdominal:     General: Abdomen is flat. Bowel sounds are normal.     Palpations: Abdomen is soft.  Neurological:     Mental Status: He is alert and oriented to person, place, and time.  Psychiatric:        Mood and Affect: Mood normal.        Behavior: Behavior normal.     Diabetic Foot Exam - Simple   No data filed       Lab Results  Component Value Date   WBC 13.2 (H) 04/26/2019   HGB 15.9 04/26/2019   HCT 46.7 04/26/2019   PLT 300 04/26/2019   GLUCOSE 107 (H) 04/26/2019   ALT 12 03/03/2019   AST 12 (L) 03/03/2019   NA 138 04/26/2019   K 4.0 04/26/2019   CL 105 04/26/2019   CREATININE 0.92 04/26/2019   BUN 18 04/26/2019   CO2 23 04/26/2019   INR 2.0 (H) 04/26/2019      Assessment & Plan:  1. Essential hypertension Well controlled.  No changes to medicines.  Continue to work on eating a healthy diet and exercise.  Labs drawn today.  - Comprehensive metabolic panel - CBC with Differential/Platelet - metoprolol tartrate (LOPRESSOR) 50 MG tablet; Take 1 tablet (50 mg total) by mouth 2 (two) times daily.  Dispense: 180 tablet; Refill: 0 - lisinopril (ZESTRIL) 40 MG tablet; Take 1 tablet (40 mg total) by mouth daily.  Dispense: 90 tablet; Refill: 0  2. Mixed hyperlipidemia Well controlled except low hdl.  No changes to medicines.  Continue to work on eating a healthy diet and exercise.  Labs drawn today.  - Lipid panel - pravastatin (PRAVACHOL) 40 MG tablet; Take 1 tablet (40 mg total) by mouth daily.  Dispense: 90 tablet; Refill: 0  3. Other chronic pulmonary embolism without acute cor pulmonale (HCC) History of recurrent pulmonary embolisms. Pt to continue on eliquis life long.  - apixaban (ELIQUIS) 5 MG TABS tablet; Take 1 tablet (5 mg total) by mouth 2 (two) times daily.  Dispense: 60 tablet; Refill: 2  4. History of stroke -  clopidogrel (PLAVIX) 75 MG tablet; Take 1 tablet (75 mg total) by mouth daily.  Dispense: 90 tablet; Refill: 0  5. Class 1 obesity due to excess calories with serious comorbidity and body mass index (BMI) of 33.0 to 33.9 in adult Recommend continue to work on eating healthy diet and exercise.  6. Simple chronic bronchitis (  [place]) No inhalers needed currently.   7. Vapes nicotine containing substance Recommend cessation of all inhaled nicotine supplements. Pt to consider. At this time is not interested.   8. Sequela, post-stroke Confusion. Continue current treatment,  Follow-up: Return in about 3 months (around 10/28/2019) for fasting. Schedule AWV with Maudie Mercury. He is overdue for his AWV. Marland Kitchen  An After Visit Summary was printed and given to the patient.  [name] [facility] [phone]

## 2019-07-28 ENCOUNTER — Other Ambulatory Visit: Payer: Self-pay

## 2019-07-28 ENCOUNTER — Encounter: Payer: Self-pay | Admitting: Family Medicine

## 2019-07-28 ENCOUNTER — Ambulatory Visit (INDEPENDENT_AMBULATORY_CARE_PROVIDER_SITE_OTHER): Payer: Medicare HMO | Admitting: Family Medicine

## 2019-07-28 VITALS — BP 138/78 | HR 71 | Temp 98.0°F | Ht 64.0 in | Wt 196.0 lb

## 2019-07-28 DIAGNOSIS — Z8673 Personal history of transient ischemic attack (TIA), and cerebral infarction without residual deficits: Secondary | ICD-10-CM | POA: Diagnosis not present

## 2019-07-28 DIAGNOSIS — I6930 Unspecified sequelae of cerebral infarction: Secondary | ICD-10-CM

## 2019-07-28 DIAGNOSIS — E782 Mixed hyperlipidemia: Secondary | ICD-10-CM | POA: Diagnosis not present

## 2019-07-28 DIAGNOSIS — J410 Simple chronic bronchitis: Secondary | ICD-10-CM

## 2019-07-28 DIAGNOSIS — E6609 Other obesity due to excess calories: Secondary | ICD-10-CM

## 2019-07-28 DIAGNOSIS — I10 Essential (primary) hypertension: Secondary | ICD-10-CM | POA: Diagnosis not present

## 2019-07-28 DIAGNOSIS — I2782 Chronic pulmonary embolism: Secondary | ICD-10-CM | POA: Diagnosis not present

## 2019-07-28 DIAGNOSIS — Z720 Tobacco use: Secondary | ICD-10-CM

## 2019-07-28 DIAGNOSIS — Z6833 Body mass index (BMI) 33.0-33.9, adult: Secondary | ICD-10-CM

## 2019-07-28 HISTORY — DX: Other obesity due to excess calories: E66.09

## 2019-07-28 HISTORY — DX: Unspecified sequelae of cerebral infarction: I69.30

## 2019-07-28 MED ORDER — METOPROLOL TARTRATE 50 MG PO TABS: 50.0000 mg | ORAL_TABLET | Freq: Two times a day (BID) | ORAL | 0 refills | Status: DC

## 2019-07-28 MED ORDER — CLOPIDOGREL BISULFATE 75 MG PO TABS: 75.0000 mg | ORAL_TABLET | Freq: Every day | ORAL | 0 refills | Status: DC

## 2019-07-28 MED ORDER — PRAVASTATIN SODIUM 40 MG PO TABS: 40.0000 mg | ORAL_TABLET | Freq: Every day | ORAL | 0 refills | Status: DC

## 2019-07-28 MED ORDER — LISINOPRIL 40 MG PO TABS: 40.0000 mg | ORAL_TABLET | Freq: Every day | ORAL | 0 refills | Status: DC

## 2019-07-28 MED ORDER — APIXABAN 5 MG PO TABS: 5.0000 mg | ORAL_TABLET | Freq: Two times a day (BID) | ORAL | 2 refills | Status: DC

## 2019-07-28 NOTE — Patient Instructions (Signed)
Strongly recommend to get Covid vaccine (moderna or Pfizer.) Recommend discontinue vaping. Continue current medications. Recommend work on eating healthy and exercise. You will be scheduled to see Shelle Iron, LPN for your annual wellness visit.

## 2019-07-29 LAB — CBC WITH DIFFERENTIAL/PLATELET
Basophils Absolute: 0.1 10*3/uL (ref 0.0–0.2)
Basos: 1 %
EOS (ABSOLUTE): 0.1 10*3/uL (ref 0.0–0.4)
Eos: 2 %
Hematocrit: 54 % — ABNORMAL HIGH (ref 37.5–51.0)
Hemoglobin: 17.6 g/dL (ref 13.0–17.7)
Immature Grans (Abs): 0.1 10*3/uL (ref 0.0–0.1)
Immature Granulocytes: 1 %
Lymphocytes Absolute: 3.2 10*3/uL — ABNORMAL HIGH (ref 0.7–3.1)
Lymphs: 36 %
MCH: 29.7 pg (ref 26.6–33.0)
MCHC: 32.6 g/dL (ref 31.5–35.7)
MCV: 91 fL (ref 79–97)
Monocytes Absolute: 0.6 10*3/uL (ref 0.1–0.9)
Monocytes: 7 %
Neutrophils Absolute: 4.8 10*3/uL (ref 1.4–7.0)
Neutrophils: 53 %
Platelets: 297 10*3/uL (ref 150–450)
RBC: 5.93 x10E6/uL — ABNORMAL HIGH (ref 4.14–5.80)
RDW: 13.8 % (ref 11.6–15.4)
WBC: 8.9 10*3/uL (ref 3.4–10.8)

## 2019-07-29 LAB — LIPID PANEL
Chol/HDL Ratio: 7 ratio — ABNORMAL HIGH (ref 0.0–5.0)
Cholesterol, Total: 225 mg/dL — ABNORMAL HIGH (ref 100–199)
HDL: 32 mg/dL — ABNORMAL LOW (ref >39)
LDL Chol Calc (NIH): 161 mg/dL — ABNORMAL HIGH (ref 0–99)
Triglycerides: 171 mg/dL — ABNORMAL HIGH (ref 0–149)
VLDL Cholesterol Cal: 32 mg/dL (ref 5–40)

## 2019-07-29 LAB — COMPREHENSIVE METABOLIC PANEL
ALT: 23 IU/L (ref 0–44)
AST: 17 IU/L (ref 0–40)
Albumin/Globulin Ratio: 1.5 (ref 1.2–2.2)
Albumin: 4.5 g/dL (ref 3.8–4.9)
Alkaline Phosphatase: 100 IU/L (ref 48–121)
BUN/Creatinine Ratio: 11 (ref 9–20)
BUN: 11 mg/dL (ref 6–24)
Bilirubin Total: 0.3 mg/dL (ref 0.0–1.2)
CO2: 24 mmol/L (ref 20–29)
Calcium: 10.1 mg/dL (ref 8.7–10.2)
Chloride: 101 mmol/L (ref 96–106)
Creatinine, Ser: 1.02 mg/dL (ref 0.76–1.27)
GFR calc Af Amer: 95 mL/min/{1.73_m2} (ref >59)
GFR calc non Af Amer: 82 mL/min/{1.73_m2} (ref >59)
Globulin, Total: 3 g/dL (ref 1.5–4.5)
Glucose: 80 mg/dL (ref 65–99)
Potassium: 5.5 mmol/L — ABNORMAL HIGH (ref 3.5–5.2)
Sodium: 139 mmol/L (ref 134–144)
Total Protein: 7.5 g/dL (ref 6.0–8.5)

## 2019-07-29 LAB — CARDIOVASCULAR RISK ASSESSMENT

## 2019-08-04 ENCOUNTER — Other Ambulatory Visit: Payer: Self-pay

## 2019-08-04 MED ORDER — ROSUVASTATIN CALCIUM 40 MG PO TABS: 40.0000 mg | ORAL_TABLET | Freq: Every day | ORAL | 1 refills | Status: DC

## 2019-08-19 ENCOUNTER — Ambulatory Visit (INDEPENDENT_AMBULATORY_CARE_PROVIDER_SITE_OTHER): Payer: Medicare HMO

## 2019-08-19 ENCOUNTER — Other Ambulatory Visit: Payer: Self-pay

## 2019-08-19 VITALS — BP 152/84 | HR 70 | Temp 98.4°F | Resp 16 | Ht 64.0 in | Wt 201.8 lb

## 2019-08-19 DIAGNOSIS — Z1211 Encounter for screening for malignant neoplasm of colon: Secondary | ICD-10-CM

## 2019-08-19 DIAGNOSIS — Z0000 Encounter for general adult medical examination without abnormal findings: Secondary | ICD-10-CM

## 2019-08-19 NOTE — Patient Instructions (Addendum)
 Fall Prevention in the Home, Adult Falls can cause injuries. They can happen to people of all ages. There are many things you can do to make your home safe and to help prevent falls. Ask for help when making these changes, if needed. What actions can I take to prevent falls? General Instructions  Use good lighting in all rooms. Replace any light bulbs that burn out.  Turn on the lights when you go into a dark area. Use night-lights.  Keep items that you use often in easy-to-reach places. Lower the shelves around your home if necessary.  Set up your furniture so you have a clear path. Avoid moving your furniture around.  Do not have throw rugs and other things on the floor that can make you trip.  Avoid walking on wet floors.  If any of your floors are uneven, fix them.  Add color or contrast paint or tape to clearly mark and help you see: ? Any grab bars or handrails. ? First and last steps of stairways. ? Where the edge of each step is.  If you use a stepladder: ? Make sure that it is fully opened. Do not climb a closed stepladder. ? Make sure that both sides of the stepladder are locked into place. ? Ask someone to hold the stepladder for you while you use it.  If there are any pets around you, be aware of where they are. What can I do in the bathroom?      Keep the floor dry. Clean up any water that spills onto the floor as soon as it happens.  Remove soap buildup in the tub or shower regularly.  Use non-skid mats or decals on the floor of the tub or shower.  Attach bath mats securely with double-sided, non-slip rug tape.  If you need to sit down in the shower, use a plastic, non-slip stool.  Install grab bars by the toilet and in the tub and shower. Do not use towel bars as grab bars. What can I do in the bedroom?  Make sure that you have a light by your bed that is easy to reach.  Do not use any sheets or blankets that are too big for your bed. They should  not hang down onto the floor.  Have a firm chair that has side arms. You can use this for support while you get dressed. What can I do in the kitchen?  Clean up any spills right away.  If you need to reach something above you, use a strong step stool that has a grab bar.  Keep electrical cords out of the way.  Do not use floor polish or wax that makes floors slippery. If you must use wax, use non-skid floor wax. What can I do with my stairs?  Do not leave any items on the stairs.  Make sure that you have a light switch at the top of the stairs and the bottom of the stairs. If you do not have them, ask someone to add them for you.  Make sure that there are handrails on both sides of the stairs, and use them. Fix handrails that are broken or loose. Make sure that handrails are as long as the stairways.  Install non-slip stair treads on all stairs in your home.  Avoid having throw rugs at the top or bottom of the stairs. If you do have throw rugs, attach them to the floor with carpet tape.  Choose a carpet that   does not hide the edge of the steps on the stairway.  Check any carpeting to make sure that it is firmly attached to the stairs. Fix any carpet that is loose or worn. What can I do on the outside of my home?  Use bright outdoor lighting.  Regularly fix the edges of walkways and driveways and fix any cracks.  Remove anything that might make you trip as you walk through a door, such as a raised step or threshold.  Trim any bushes or trees on the path to your home.  Regularly check to see if handrails are loose or broken. Make sure that both sides of any steps have handrails.  Install guardrails along the edges of any raised decks and porches.  Clear walking paths of anything that might make someone trip, such as tools or rocks.  Have any leaves, snow, or ice cleared regularly.  Use sand or salt on walking paths during winter.  Clean up any spills in your garage right  away. This includes grease or oil spills. What other actions can I take?  Wear shoes that: ? Have a low heel. Do not wear high heels. ? Have rubber bottoms. ? Are comfortable and fit you well. ? Are closed at the toe. Do not wear open-toe sandals.  Use tools that help you move around (mobility aids) if they are needed. These include: ? Canes. ? Walkers. ? Scooters. ? Crutches.  Review your medicines with your doctor. Some medicines can make you feel dizzy. This can increase your chance of falling. Ask your doctor what other things you can do to help prevent falls. Where to find more information  Centers for Disease Control and Prevention, [employer]AD[employer]: https://cdc.gov  National Institute on Aging: https://go4life.nia.nih.gov Contact a doctor if:  You are afraid of falling at home.  You feel weak, drowsy, or dizzy at home.  You fall at home. Summary  There are many simple things that you can do to make your home safe and to help prevent falls.  Ways to make your home safe include removing tripping hazards and installing grab bars in the bathroom.  Ask for help when making these changes in your home. This information is not intended to replace advice given to you by your health care provider. Make sure you discuss any questions you have with your health care provider. Document Revised: 06/20/2018 Document Reviewed: 10/12/2016 Elsevier Patient Education  2020 Elsevier Inc.   Health Maintenance, Male Adopting a healthy lifestyle and getting preventive care are important in promoting health and wellness. Ask your health care provider about:  The right schedule for you to have regular tests and exams.  Things you can do on your own to prevent diseases and keep yourself healthy. What should I know about diet, weight, and exercise? Eat a healthy diet   Eat a diet that includes plenty of vegetables, fruits, low-fat dairy products, and lean protein.  Do not eat a lot of foods  that are high in solid fats, added sugars, or sodium. Maintain a healthy weight Body mass index (BMI) is a measurement that can be used to identify possible weight problems. It estimates body fat based on height and weight. Your health care provider can help determine your BMI and help you achieve or maintain a healthy weight. Get regular exercise Get regular exercise. This is one of the most important things you can do for your health. Most adults should:  Exercise for at least 150 minutes each week. The   exercise should increase your heart rate and make you sweat (moderate-intensity exercise).  Do strengthening exercises at least twice a week. This is in addition to the moderate-intensity exercise.  Spend less time sitting. Even light physical activity can be beneficial. Watch cholesterol and blood lipids Have your blood tested for lipids and cholesterol at 55 years of age, then have this test every 5 years. You may need to have your cholesterol levels checked more often if:  Your lipid or cholesterol levels are high.  You are older than 55 years of age.  You are at high risk for heart disease. What should I know about cancer screening? Many types of cancers can be detected early and may often be prevented. Depending on your health history and family history, you may need to have cancer screening at various ages. This may include screening for:  Colorectal cancer.  Prostate cancer.  Skin cancer.  Lung cancer. What should I know about heart disease, diabetes, and high blood pressure? Blood pressure and heart disease  High blood pressure causes heart disease and increases the risk of stroke. This is more likely to develop in people who have high blood pressure readings, are of African descent, or are overweight.  Talk with your health care provider about your target blood pressure readings.  Have your blood pressure checked: ? Every 3-5 years if you are 18-39 years of  age. ? Every year if you are 40 years old or older.  If you are between the ages of 65 and 75 and are a current or former smoker, ask your health care provider if you should have a one-time screening for abdominal aortic aneurysm (AAA). Diabetes Have regular diabetes screenings. This checks your fasting blood sugar level. Have the screening done:  Once every three years after age 45 if you are at a normal weight and have a low risk for diabetes.  More often and at a younger age if you are overweight or have a high risk for diabetes. What should I know about preventing infection? Hepatitis B If you have a higher risk for hepatitis B, you should be screened for this virus. Talk with your health care provider to find out if you are at risk for hepatitis B infection. Hepatitis C Blood testing is recommended for:  Everyone born from 1945 through 1965.  Anyone with known risk factors for hepatitis C. Sexually transmitted infections (STIs)  You should be screened each year for STIs, including gonorrhea and chlamydia, if: ? You are sexually active and are younger than 55 years of age. ? You are older than 55 years of age and your health care provider tells you that you are at risk for this type of infection. ? Your sexual activity has changed since you were last screened, and you are at increased risk for chlamydia or gonorrhea. Ask your health care provider if you are at risk.  Ask your health care provider about whether you are at high risk for HIV. Your health care provider may recommend a prescription medicine to help prevent HIV infection. If you choose to take medicine to prevent HIV, you should first get tested for HIV. You should then be tested every 3 months for as long as you are taking the medicine. Follow these instructions at home: Lifestyle  Do not use any products that contain nicotine or tobacco, such as cigarettes, e-cigarettes, and chewing tobacco. If you need help quitting,  ask your health care provider.  Do not use street   drugs.  Do not share needles.  Ask your health care provider for help if you need support or information about quitting drugs. Alcohol use  Do not drink alcohol if your health care provider tells you not to drink.  If you drink alcohol: ? Limit how much you have to 0-2 drinks a day. ? Be aware of how much alcohol is in your drink. In the [place] one drink equals one 12 oz bottle of beer (355 mL), one 5 oz glass of wine (148 mL), or one 1 oz glass of hard liquor (44 mL). General instructions  Schedule regular health, dental, and eye exams.  Stay current with your vaccines.  Tell your health care provider if: ? You often feel depressed. ? You have ever been abused or do not feel safe at home. Summary  Adopting a healthy lifestyle and getting preventive care are important in promoting health and wellness.  Follow your health care provider's instructions about healthy diet, exercising, and getting tested or screened for diseases.  Follow your health care provider's instructions on monitoring your cholesterol and blood pressure. This information is not intended to replace advice given to you by your health care provider. Make sure you discuss any questions you have with your health care provider. Document Revised: 02/20/2018 Document Reviewed: 02/20/2018 Elsevier Patient Education  2020 [employer].    Scott Foley , Thank you for taking time to come for your Medicare Wellness Visit. I appreciate your ongoing commitment to your health goals. Please review the following plan we discussed and let me know if I can assist you in the future.   Goals: Complete Advance Directive and Exercise as tolerated   This is a list of the screening recommended for you and due dates:  Health Maintenance  Topic Date Due  . Tetanus Vaccine  Unknown - done years ago per patient  . Colon Cancer Screening  Never done - ordered Cologuard  . COVID-19  Vaccine (1) 09/04/2019*  Declined by patient  . Flu Shot  Fall 2021  *Topic was postponed. The date shown is not the original due date.   Please let me know if you need assistance completing your advance directive, I will be glad to help.

## 2019-08-19 NOTE — Progress Notes (Signed)
Subjective:   Neely Cecena is a 54 y.o. male who presents for Medicare Annual/Subsequent preventive examination.  This wellness visit is conducted by a nurse.  The patient's medications were reviewed and reconciled since the patient's last visit.  History details were provided by the patient.  The history appears to be reliable.    Patient's last AWV was one year ago.   Medical History: Patient history and Family history was reviewed  Medications, Allergies, and preventative health maintenance was reviewed and updated.   Review of Systems:  Review of Systems  Constitutional: Negative.   HENT: Negative.   Eyes: Positive for visual disturbance.       Corrected with glasses  Respiratory: Negative.  Negative for cough, chest tightness and shortness of breath.   Cardiovascular: Negative.  Negative for chest pain and palpitations.  Gastrointestinal: Negative.   Genitourinary: Negative.   Musculoskeletal: Negative.  Negative for back pain and myalgias.  Neurological: Negative.  Negative for dizziness, numbness and headaches.  Psychiatric/Behavioral: Negative.  Negative for agitation, sleep disturbance and suicidal ideas. The patient is not nervous/anxious.    Cardiac Risk Factors include: hypertension;smoking/ tobacco exposure;obesity (BMI >30kg/m2)     Objective:    Vitals: BP (!) 152/84 (BP Location: Left Arm, Patient Position: Sitting, Cuff Size: Large)   Pulse 70   Temp 98.4 F (36.9 C) (Temporal)   Resp 16   Ht 5\' 4"  (1.626 m)   Wt 201 lb 12.8 oz (91.5 kg)   SpO2 96%   BMI 34.64 kg/m   Body mass index is 34.64 kg/m.  Advanced Directives 08/19/2019 04/26/2019 03/03/2019 03/03/2019 06/24/2013  Does Patient Have a Medical Advance Directive? No No No No Patient does not have advance directive  Would patient like information on creating a medical advance directive? Yes (MAU/Ambulatory/Procedural Areas - Information given) No - Patient declined No - Patient declined No -  Patient declined -  Pre-existing out of facility DNR order (yellow form or pink MOST form) - - - - No    Tobacco Social History   Tobacco Use  Smoking Status Former Smoker  . Packs/day: 0.50  . Types: Cigarettes  . Start date: 03/14/1979  . Quit date: 07/28/2018  . Years since quitting: 1.0  Smokeless Tobacco Never Used     Counseling given: Not Answered   Clinical Intake:  Pre-visit preparation completed: Yes  Pain : No/denies pain Pain Score: 0-No pain     BMI - recorded: 34.64 Nutritional Status: BMI > 30  Obese Diabetes: No  How often do you need to have someone help you when you read instructions, pamphlets, or other written materials from your doctor or pharmacy?: 2 - Rarely  Interpreter Needed?: No     Past Medical History:  Diagnosis Date  . CAD (coronary artery disease) 2012   [facility] No heart cath, treated medically [facility]  . History of stroke   . Hyperlipemia   . Hypertension   . PE (pulmonary thromboembolism) ([place])    Past Surgical History:  Procedure Laterality Date  . TONSILLECTOMY     Family History  Problem Relation Age of Onset  . Hypertension Father   . Heart attack Father   . Stroke Father   . Prostate cancer Father   . Hypertension Sister   . Hypertension Brother   . Heart attack Brother   . Lung cancer Sister    Social History   Socioeconomic History  . Marital status: Married    Spouse name:  Not on file  . Number of children: Not on file  . Years of education: Not on file  . Highest education level: Not on file  Occupational History  . Not on file  Tobacco Use  . Smoking status: Former Smoker    Packs/day: 0.50    Types: Cigarettes    Start date: 03/14/1979    Quit date: 07/28/2018    Years since quitting: 1.0  . Smokeless tobacco: Never Used  Substance and Sexual Activity  . Alcohol use: No  . Drug use: No  . Sexual activity: Not on file  Other Topics Concern  . Not on file  Social History Narrative  . Not on file    Social Determinants of Health   Financial Resource Strain:   . Difficulty of Paying Living Expenses:   Food Insecurity:   . Worried About Charity fundraiser in the Last Year:   . Arboriculturist in the Last Year:   Transportation Needs:   . Film/video editor (Medical):   Marland Kitchen Lack of Transportation (Non-Medical):   Physical Activity:   . Days of Exercise per Week:   . Minutes of Exercise per Session:   Stress:   . Feeling of Stress :   Social Connections:   . Frequency of Communication with Friends and Family:   . Frequency of Social Gatherings with Friends and Family:   . Attends Religious Services:   . Active Member of Clubs or Organizations:   . Attends Archivist Meetings:   Marland Kitchen Marital Status:     Outpatient Encounter Medications as of 08/19/2019  Medication Sig  . apixaban (ELIQUIS) 5 MG TABS tablet Take 1 tablet (5 mg total) by mouth 2 (two) times daily.  . clopidogrel (PLAVIX) 75 MG tablet Take 1 tablet (75 mg total) by mouth daily.  Marland Kitchen lisinopril (ZESTRIL) 40 MG tablet Take 1 tablet (40 mg total) by mouth daily.  . metoprolol tartrate (LOPRESSOR) 50 MG tablet Take 1 tablet (50 mg total) by mouth 2 (two) times daily.  . Omega-3 Fatty Acids (FISH OIL) 1000 MG CAPS Take by mouth 2 (two) times daily.  . rosuvastatin (CRESTOR) 40 MG tablet Take 1 tablet (40 mg total) by mouth daily.  Marland Kitchen oxyCODONE-acetaminophen (PERCOCET) 7.5-325 MG tablet Take 1 tablet by mouth every 6 (six) hours as needed for severe pain. (Patient not taking: Reported on 08/19/2019)   No facility-administered encounter medications on file as of 08/19/2019.    Activities of Daily Living In your present state of health, do you have any difficulty performing the following activities: 08/19/2019 03/03/2019  Hearing? N N  Vision? N N  Difficulty concentrating or making decisions? N N  Walking or climbing stairs? N N  Dressing or bathing? N N  Doing errands, shopping? N N  Preparing Food and eating ?  N -  Using the Toilet? N -  In the past six months, have you accidently leaked urine? N -  Do you have problems with loss of bowel control? N -  Managing your Medications? N -  Managing your Finances? N -  Housekeeping or managing your Housekeeping? N -  Some recent data might be hidden    Patient Care Team: [name], MD as PCP - General (Family Medicine)   Assessment:   This is a routine wellness examination for [place].  Exercise Activities and Dietary recommendations Current Exercise Habits: The patient does not participate in regular exercise at present, Exercise limited by:  None identified  Goals   None     Fall Risk Fall Risk  08/19/2019  Falls in the past year? 1  Comment Slipped on Ice  Number falls in past yr: 0  Injury with Fall? 1  Comment broken ribs  Risk for fall due to : No Fall Risks  Follow up Falls evaluation completed;Falls prevention discussed   Is the patient's home free of loose throw rugs in walkways, pet beds, electrical cords, etc?   yes      Grab bars in the bathroom? no      Handrails on the stairs?   yes      Adequate lighting?   yes   Depression Screen PHQ 2/9 Scores 08/19/2019 07/28/2019  PHQ - 2 Score 0 0    Cognitive Function     6CIT Screen 08/19/2019  What Year? 0 points  What month? 0 points  What time? 0 points  Count back from 20 0 points  Months in reverse 4 points  Repeat phrase 4 points  Total Score 8    Immunization History  Administered Date(s) Administered  . Influenza Split 01/03/2012  . Influenza,inj,Quad PF,6+ Mos 11/25/2012  . Pneumococcal Polysaccharide-23 11/25/2012    Screening Tests Health Maintenance  Topic Date Due  . Samul Dada  "done years ago"  . COLONOSCOPY  Never done  . COVID-19 Vaccine (1) Declined  . INFLUENZA VACCINE  Fall 2021   Cancer Screenings: Lung: Low Dose CT Chest recommended if Age 31-80 years, 30 pack-year currently smoking OR have quit w/in 15years. Patient does not  qualify. Colorectal: Never done - Cologuard ordered       Plan:    Counseling was provided today regarding the following topics: healthy eating habits, home safety, vitamin and mineral supplementation (Multivitiman), regular exercise, tobacco/Vape avoidance, limitation of alcohol intake, use of seat belts, firearm safety, and fall prevention.  Annual recommendations include: influenza vaccine, dental cleanings, and eye exams.  Patient declined COVID vaccines due to not believing necessary.    Cologuard ordered  Patient educated and given information on advance directives.  I have personally reviewed and noted the following in the patient's chart:   . Medical and social history . Use of alcohol, tobacco or illicit drugs  . Current medications and supplements . Functional ability and status . Nutritional status . Physical activity . Advanced directives . List of other physicians . Hospitalizations, surgeries, and ER visits in previous 12 months . Vitals . Screenings to include cognitive, depression, and falls . Referrals and appointments  In addition, I have reviewed and discussed with patient certain preventive protocols, quality metrics, and best practice recommendations. A written personalized care plan for preventive services as well as general preventive health recommendations were provided to patient.     Erie Noe, LPN  8/0/8811

## 2019-10-29 ENCOUNTER — Ambulatory Visit: Payer: Medicare HMO | Admitting: Family Medicine

## 2019-11-20 ENCOUNTER — Other Ambulatory Visit: Payer: Self-pay

## 2019-11-20 DIAGNOSIS — I10 Essential (primary) hypertension: Secondary | ICD-10-CM

## 2019-11-20 MED ORDER — LISINOPRIL 40 MG PO TABS: 40.0000 mg | ORAL_TABLET | Freq: Every day | ORAL | 0 refills | Status: DC

## 2019-12-15 ENCOUNTER — Ambulatory Visit (INDEPENDENT_AMBULATORY_CARE_PROVIDER_SITE_OTHER): Payer: Medicare HMO | Admitting: Family Medicine

## 2019-12-15 ENCOUNTER — Encounter: Payer: Self-pay | Admitting: Family Medicine

## 2019-12-15 ENCOUNTER — Other Ambulatory Visit: Payer: Self-pay

## 2019-12-15 VITALS — BP 138/94 | HR 54 | Temp 97.3°F | Ht 64.0 in | Wt 200.0 lb

## 2019-12-15 DIAGNOSIS — E782 Mixed hyperlipidemia: Secondary | ICD-10-CM

## 2019-12-15 DIAGNOSIS — Z23 Encounter for immunization: Secondary | ICD-10-CM | POA: Diagnosis not present

## 2019-12-15 DIAGNOSIS — I10 Essential (primary) hypertension: Secondary | ICD-10-CM

## 2019-12-15 DIAGNOSIS — Z720 Tobacco use: Secondary | ICD-10-CM | POA: Diagnosis not present

## 2019-12-15 DIAGNOSIS — R918 Other nonspecific abnormal finding of lung field: Secondary | ICD-10-CM

## 2019-12-15 DIAGNOSIS — Z7901 Long term (current) use of anticoagulants: Secondary | ICD-10-CM | POA: Diagnosis not present

## 2019-12-15 DIAGNOSIS — Z8673 Personal history of transient ischemic attack (TIA), and cerebral infarction without residual deficits: Secondary | ICD-10-CM | POA: Diagnosis not present

## 2019-12-15 HISTORY — DX: Encounter for immunization: Z23

## 2019-12-15 MED ORDER — AMLODIPINE BESYLATE 5 MG PO TABS: 5.0000 mg | ORAL_TABLET | Freq: Every day | ORAL | 0 refills | Status: DC

## 2019-12-15 NOTE — Progress Notes (Signed)
Established Patient Office Visit  Subjective:  Patient ID: Scott Foley, male    DOB: 12/03/1965  Age: 54 y.o. MRN: [id number]  CC:  Chief Complaint  Patient presents with  . Hyperlipidemia    6 months fasting    HPI Caedan Sumler presents for HTN-elevated bp at home-pt states no recent cardio appt.  CAD-plavix and eliquis. Metoprolol BID + Zestril qd. Hyperlipidemia-pt started crestor for improved control. CVA-effected memory. No COVID disease or vaccine.  Past Medical History:  Diagnosis Date  . CAD (coronary artery disease) 2012   [facility] No heart cath, treated medically [facility]  . History of stroke   . Hyperlipemia   . Hypertension   . PE (pulmonary thromboembolism) ([place])     Past Surgical History:  Procedure Laterality Date  . TONSILLECTOMY      Family History  Problem Relation Age of Onset  . Hypertension Father   . Heart attack Father   . Stroke Father   . Prostate cancer Father   . Hypertension Sister   . Hypertension Brother   . Heart attack Brother   . Lung cancer Sister     Social History   Socioeconomic History  . Marital status: Married    Spouse name: Not on file  . Number of children: Not on file  . Years of education: Not on file  . Highest education level: Not on file  Occupational History  . Not on file  Tobacco Use  . Smoking status: Former Smoker    Packs/day: 0.50    Types: Cigarettes    Start date: 03/14/1979    Quit date: 07/28/2018    Years since quitting: 1.3  . Smokeless tobacco: Never Used  Vaping Use  . Vaping Use: Every day  . Start date: 07/28/2018  . Devices: 6 mg daily.  Substance and Sexual Activity  . Alcohol use: No  . Drug use: No  . Sexual activity: Not on file  Other Topics Concern  . Not on file  Social History Narrative  . Not on file   Social Determinants of Health   Financial Resource Strain:   . Difficulty of Paying Living Expenses: Not on file  Food Insecurity:   . Worried About  Charity fundraiser in the Last Year: Not on file  . Ran Out of Food in the Last Year: Not on file  Transportation Needs:   . Lack of Transportation (Medical): Not on file  . Lack of Transportation (Non-Medical): Not on file  Physical Activity:   . Days of Exercise per Week: Not on file  . Minutes of Exercise per Session: Not on file  Stress:   . Feeling of Stress : Not on file  Social Connections:   . Frequency of Communication with Friends and Family: Not on file  . Frequency of Social Gatherings with Friends and Family: Not on file  . Attends Religious Services: Not on file  . Active Member of Clubs or Organizations: Not on file  . Attends Archivist Meetings: Not on file  . Marital Status: Not on file  Intimate Partner Violence:   . Fear of Current or Ex-Partner: Not on file  . Emotionally Abused: Not on file  . Physically Abused: Not on file  . Sexually Abused: Not on file    Outpatient Medications Prior to Visit  Medication Sig Dispense Refill  . apixaban (ELIQUIS) 5 MG TABS tablet Take 1 tablet (5 mg total) by mouth 2 (  two) times daily. 60 tablet 2  . clopidogrel (PLAVIX) 75 MG tablet Take 1 tablet (75 mg total) by mouth daily. 90 tablet 0  . lisinopril (ZESTRIL) 40 MG tablet Take 1 tablet (40 mg total) by mouth daily. Needs fasting appointment. Dr. Tobie Poet 30 tablet 0  . metoprolol tartrate (LOPRESSOR) 50 MG tablet Take 1 tablet (50 mg total) by mouth 2 (two) times daily. 180 tablet 0  . Omega-3 Fatty Acids (FISH OIL) 1000 MG CAPS Take by mouth 2 (two) times daily.    Marland Kitchen oxyCODONE-acetaminophen (PERCOCET) 7.5-325 MG tablet Take 1 tablet by mouth every 6 (six) hours as needed for severe pain. 20 tablet 0  . rosuvastatin (CRESTOR) 40 MG tablet Take 1 tablet (40 mg total) by mouth daily. 90 tablet 1   No facility-administered medications prior to visit.    Allergies  Allergen Reactions  . Penicillin G Hives    ROS Review of Systems  Eyes:       Glasses utd   Respiratory: Negative.   Cardiovascular: Negative for chest pain, palpitations and leg swelling.  Genitourinary: Negative.   Neurological: Negative for dizziness, seizures, weakness and headaches.      Objective:    Physical Exam Vitals reviewed.  Constitutional:      Appearance: Normal appearance.  Cardiovascular:     Rate and Rhythm: Normal rate and regular rhythm.     Pulses: Normal pulses.     Heart sounds: Normal heart sounds.  Pulmonary:     Effort: Pulmonary effort is normal.     Breath sounds: Normal breath sounds.  Musculoskeletal:        General: No swelling or tenderness.     Cervical back: Normal range of motion and neck supple.  Neurological:     Mental Status: He is oriented to person, place, and time.  Psychiatric:        Mood and Affect: Mood normal.        Behavior: Behavior normal.     BP (!) 138/94 (BP Location: Left Arm, Patient Position: Sitting)   Pulse (!) 54   Temp (!) 97.3 F (36.3 C) (Temporal)   Ht 5\' 4"  (1.626 m)   Wt 200 lb (90.7 kg)   SpO2 96%   BMI 34.33 kg/m  Wt Readings from Last 3 Encounters:  12/15/19 200 lb (90.7 kg)  08/19/19 201 lb 12.8 oz (91.5 kg)  07/28/19 196 lb (88.9 kg)   Left arm 148/98  Health Maintenance Due  Topic Date Due  . Hepatitis C Screening  Never done  . COVID-19 Vaccine (1) Never done  . HIV Screening  Never done  . TETANUS/TDAP  Never done  . COLONOSCOPY  Never done    Lab Results  Component Value Date   WBC 8.9 07/28/2019   HGB 17.6 07/28/2019   HCT 54.0 (H) 07/28/2019   MCV 91 07/28/2019   PLT 297 07/28/2019   Lab Results  Component Value Date   NA 139 07/28/2019   K 5.5 (H) 07/28/2019   CO2 24 07/28/2019   GLUCOSE 80 07/28/2019   BUN 11 07/28/2019   CREATININE 1.02 07/28/2019   BILITOT 0.3 07/28/2019   ALKPHOS 100 07/28/2019   AST 17 07/28/2019   ALT 23 07/28/2019   PROT 7.5 07/28/2019   ALBUMIN 4.5 07/28/2019   CALCIUM 10.1 07/28/2019   ANIONGAP 10 04/26/2019   Lab Results   Component Value Date   CHOL 225 (H) 07/28/2019   Lab Results  Component Value  Date   HDL 32 (L) 07/28/2019   Lab Results  Component Value Date   LDLCALC 161 (H) 07/28/2019   Lab Results  Component Value Date   TRIG 171 (H) 07/28/2019   Lab Results  Component Value Date   CHOLHDL 7.0 (H) 07/28/2019     Assessment & Plan:   1. Need for vaccination - Flu Vaccine MDCK QUAD PF  2. Pulmonary nodules/lesions, multiple On Plavix-given due to CVA then PE with eliquis added  3. Mixed hyperlipidemia Lipid panel today-crestor started-previously on pravachol.  No leg pain-goal LDL 70  4. Essential hypertension Elevated today-elevated at home Not well controlled -Amlodipine 5mg  take -at night with metoprolol-new start-f/u one month 5. Anticoagulant long-term use No sign of bleeding  6. Vapes nicotine containing substance Decreasing-last dose 10pm last night  7. History of stroke Memory loss no motor residual Follow-up:   Enez Monahan Hannah Beat, MD

## 2019-12-15 NOTE — Patient Instructions (Signed)
Pickup new blood pressure medication labwork today-will call on results Continue to decrease Vap

## 2019-12-16 LAB — CMP14+EGFR
ALT: 21 IU/L (ref 0–44)
AST: 15 IU/L (ref 0–40)
Albumin/Globulin Ratio: 1.7 (ref 1.2–2.2)
Albumin: 4.4 g/dL (ref 3.8–4.9)
Alkaline Phosphatase: 92 IU/L (ref 44–121)
BUN/Creatinine Ratio: 15 (ref 9–20)
BUN: 14 mg/dL (ref 6–24)
Bilirubin Total: 0.3 mg/dL (ref 0.0–1.2)
CO2: 21 mmol/L (ref 20–29)
Calcium: 10 mg/dL (ref 8.7–10.2)
Chloride: 104 mmol/L (ref 96–106)
Creatinine, Ser: 0.94 mg/dL (ref 0.76–1.27)
GFR calc Af Amer: 105 mL/min/{1.73_m2} (ref >59)
GFR calc non Af Amer: 91 mL/min/{1.73_m2} (ref >59)
Globulin, Total: 2.6 g/dL (ref 1.5–4.5)
Glucose: 86 mg/dL (ref 65–99)
Potassium: 4.8 mmol/L (ref 3.5–5.2)
Sodium: 140 mmol/L (ref 134–144)
Total Protein: 7 g/dL (ref 6.0–8.5)

## 2019-12-16 LAB — LIPID PANEL WITH LDL/HDL RATIO
Cholesterol, Total: 240 mg/dL — ABNORMAL HIGH (ref 100–199)
HDL: 30 mg/dL — ABNORMAL LOW (ref >39)
LDL Chol Calc (NIH): 171 mg/dL — ABNORMAL HIGH (ref 0–99)
LDL/HDL Ratio: 5.7 ratio — ABNORMAL HIGH (ref 0.0–3.6)
Triglycerides: 208 mg/dL — ABNORMAL HIGH (ref 0–149)
VLDL Cholesterol Cal: 39 mg/dL (ref 5–40)

## 2019-12-16 LAB — CARDIOVASCULAR RISK ASSESSMENT

## 2020-02-16 ENCOUNTER — Ambulatory Visit (INDEPENDENT_AMBULATORY_CARE_PROVIDER_SITE_OTHER): Payer: Medicare HMO | Admitting: Family Medicine

## 2020-02-16 ENCOUNTER — Other Ambulatory Visit: Payer: Self-pay

## 2020-02-16 ENCOUNTER — Encounter: Payer: Self-pay | Admitting: Family Medicine

## 2020-02-16 VITALS — BP 128/76 | HR 68 | Temp 97.7°F | Ht 64.0 in | Wt 202.0 lb

## 2020-02-16 DIAGNOSIS — I119 Hypertensive heart disease without heart failure: Secondary | ICD-10-CM | POA: Diagnosis not present

## 2020-02-16 DIAGNOSIS — D170 Benign lipomatous neoplasm of skin and subcutaneous tissue of head, face and neck: Secondary | ICD-10-CM | POA: Diagnosis not present

## 2020-02-16 DIAGNOSIS — E782 Mixed hyperlipidemia: Secondary | ICD-10-CM | POA: Diagnosis not present

## 2020-02-16 DIAGNOSIS — I2782 Chronic pulmonary embolism: Secondary | ICD-10-CM | POA: Diagnosis not present

## 2020-02-16 DIAGNOSIS — Z1211 Encounter for screening for malignant neoplasm of colon: Secondary | ICD-10-CM | POA: Diagnosis not present

## 2020-02-16 MED ORDER — LISINOPRIL 40 MG PO TABS: 40.0000 mg | ORAL_TABLET | Freq: Every day | ORAL | 0 refills | Status: DC

## 2020-02-16 MED ORDER — CLOPIDOGREL BISULFATE 75 MG PO TABS: 75.0000 mg | ORAL_TABLET | Freq: Every day | ORAL | 0 refills | Status: DC

## 2020-02-16 MED ORDER — APIXABAN 5 MG PO TABS: 5.0000 mg | ORAL_TABLET | Freq: Two times a day (BID) | ORAL | 0 refills | Status: DC

## 2020-02-16 MED ORDER — ROSUVASTATIN CALCIUM 40 MG PO TABS: 40.0000 mg | ORAL_TABLET | Freq: Every day | ORAL | 0 refills | Status: DC

## 2020-02-16 MED ORDER — AMLODIPINE BESYLATE 5 MG PO TABS: 5.0000 mg | ORAL_TABLET | Freq: Every day | ORAL | 0 refills | Status: DC

## 2020-02-16 MED ORDER — METOPROLOL TARTRATE 50 MG PO TABS: 50.0000 mg | ORAL_TABLET | Freq: Two times a day (BID) | ORAL | 0 refills | Status: DC

## 2020-02-16 NOTE — Progress Notes (Signed)
Subjective:  Patient ID: Scott Foley, male    DOB: 05/25/1965  Age: 54 y.o. MRN: [id number]  Chief Complaint  Patient presents with  . Hyperlipidemia  . Hypertension    HPI Hypertension- On lisinopril and metoprolol, well controlled and exercises infrequently. No swelling in his legs. Started on amlodipine one month ago.  Hyperlipidemia-taking crestor 40 mg once daily at night and otc fish oil one twice a day.  Tries to eat healthy  History of blood clots : Pt has had two PEs/DVT, and blood clot in brain. ON eliquis.   Labs drawn on 12/15/2019.  Current Outpatient Medications on File Prior to Visit  Medication Sig Dispense Refill  . Omega-3 Fatty Acids (FISH OIL) 1000 MG CAPS Take by mouth 2 (two) times daily.     No current facility-administered medications on file prior to visit.   Past Medical History:  Diagnosis Date  . CAD (coronary artery disease) 2012   [facility] No heart cath, treated medically [facility]  . History of stroke   . Hyperlipemia   . Hypertension   . PE (pulmonary thromboembolism) ([place])    Past Surgical History:  Procedure Laterality Date  . TONSILLECTOMY      Family History  Problem Relation Age of Onset  . Hypertension Father   . Heart attack Father   . Stroke Father   . Prostate cancer Father   . Hypertension Sister   . Hypertension Brother   . Heart attack Brother   . Lung cancer Sister    Social History   Socioeconomic History  . Marital status: Married    Spouse name: Not on file  . Number of children: Not on file  . Years of education: Not on file  . Highest education level: Not on file  Occupational History  . Not on file  Tobacco Use  . Smoking status: Former Smoker    Packs/day: 0.50    Types: Cigarettes    Start date: 03/14/1979    Quit date: 07/28/2018    Years since quitting: 1.5  . Smokeless tobacco: Never Used  Vaping Use  . Vaping Use: Every day  . Start date: 07/28/2018  . Devices: 6 mg daily.  Substance and  Sexual Activity  . Alcohol use: No  . Drug use: No  . Sexual activity: Not on file  Other Topics Concern  . Not on file  Social History Narrative  . Not on file   Social Determinants of Health   Financial Resource Strain:   . Difficulty of Paying Living Expenses: Not on file  Food Insecurity:   . Worried About Charity fundraiser in the Last Year: Not on file  . Ran Out of Food in the Last Year: Not on file  Transportation Needs:   . Lack of Transportation (Medical): Not on file  . Lack of Transportation (Non-Medical): Not on file  Physical Activity:   . Days of Exercise per Week: Not on file  . Minutes of Exercise per Session: Not on file  Stress:   . Feeling of Stress : Not on file  Social Connections:   . Frequency of Communication with Friends and Family: Not on file  . Frequency of Social Gatherings with Friends and Family: Not on file  . Attends Religious Services: Not on file  . Active Member of Clubs or Organizations: Not on file  . Attends Archivist Meetings: Not on file  . Marital Status: Not on file  Review of Systems  Constitutional: Negative for chills, diaphoresis, fatigue and fever.  HENT: Negative for congestion, ear pain and sore throat.   Respiratory: Negative for cough and shortness of breath.   Cardiovascular: Negative for chest pain and leg swelling.  Gastrointestinal: Negative for abdominal pain, constipation, diarrhea, nausea and vomiting.  Endocrine: Negative for polydipsia, polyphagia and polyuria.  Genitourinary: Negative for dysuria and urgency.  Musculoskeletal: Negative for arthralgias and myalgias.  Skin:       Large lipoma on posterior neck.  Neurological: Negative for dizziness and headaches.  Psychiatric/Behavioral: Negative for dysphoric mood.     Objective:  BP 128/76   Pulse 68   Temp 97.7 F (36.5 C)   Ht 5\' 4"  (1.626 m)   Wt 202 lb (91.6 kg)   SpO2 99%   BMI 34.67 kg/m   BP/Weight 02/16/2020 12/15/2019  09/15/2829  Systolic BP 517 616 073  Diastolic BP 76 94 84  Wt. (Lbs) 202 200 201.8  BMI 34.67 34.33 34.64    Physical Exam Vitals reviewed.  Constitutional:      Appearance: Normal appearance.  Neck:     Vascular: No carotid bruit.  Cardiovascular:     Rate and Rhythm: Normal rate and regular rhythm.     Pulses: Normal pulses.     Heart sounds: Normal heart sounds.  Pulmonary:     Effort: Pulmonary effort is normal. No respiratory distress.     Breath sounds: Normal breath sounds. No wheezing, rhonchi or rales.  Abdominal:     General: Bowel sounds are normal.     Palpations: Abdomen is soft.     Tenderness: There is no abdominal tenderness.  Skin:    Comments: Large lipoma on posterior neck  Neurological:     Mental Status: He is alert and oriented to person, place, and time.  Psychiatric:        Mood and Affect: Mood normal.        Behavior: Behavior normal.     Lab Results  Component Value Date   WBC 8.9 07/28/2019   HGB 17.6 07/28/2019   HCT 54.0 (H) 07/28/2019   PLT 297 07/28/2019   GLUCOSE 86 12/15/2019   CHOL 240 (H) 12/15/2019   TRIG 208 (H) 12/15/2019   HDL 30 (L) 12/15/2019   LDLCALC 171 (H) 12/15/2019   ALT 21 12/15/2019   AST 15 12/15/2019   NA 140 12/15/2019   K 4.8 12/15/2019   CL 104 12/15/2019   CREATININE 0.94 12/15/2019   BUN 14 12/15/2019   CO2 21 12/15/2019   INR 2.0 (H) 04/26/2019      Assessment & Plan:   1. Mixed hyperlipidemia  Not at goal. Continue crestor.  2. Other chronic pulmonary embolism without acute cor pulmonale (HCC)  - apixaban (ELIQUIS) 5 MG TABS tablet; Take 1 tablet (5 mg total) by mouth 2 (two) times daily.  Dispense: 90 tablet; Refill: 0 - clopidogrel (PLAVIX) 75 MG tablet; Take 1 tablet (75 mg total) by mouth daily.  Dispense: 90 tablet; Refill: 0  3. Hypertensive heart disease without heart failure The current medical regimen is effective;  continue present plan and medications. Recommend continue to work  on eating healthy diet and exercise. Refills sent. - amLODipine (NORVASC) 5 MG tablet; Take 1 tablet (5 mg total) by mouth daily.  Dispense: 90 tablet; Refill: 0 - lisinopril (ZESTRIL) 40 MG tablet; Take 1 tablet (40 mg total) by mouth daily. Needs fasting appointment. Dr. Tobie Poet  Dispense: 60  tablet; Refill: 0 - metoprolol tartrate (LOPRESSOR) 50 MG tablet; Take 1 tablet (50 mg total) by mouth 2 (two) times daily.  Dispense: 180 tablet; Refill: 0  4. Lipoma of neck - Ambulatory referral to General Surgery  5. Screening for colon cancer - Ambulatory referral to Gastroenterology  Cologuard sent.    Meds ordered this encounter  Medications  . amLODipine (NORVASC) 5 MG tablet    Sig: Take 1 tablet (5 mg total) by mouth daily.    Dispense:  90 tablet    Refill:  0  . apixaban (ELIQUIS) 5 MG TABS tablet    Sig: Take 1 tablet (5 mg total) by mouth 2 (two) times daily.    Dispense:  90 tablet    Refill:  0  . clopidogrel (PLAVIX) 75 MG tablet    Sig: Take 1 tablet (75 mg total) by mouth daily.    Dispense:  90 tablet    Refill:  0  . lisinopril (ZESTRIL) 40 MG tablet    Sig: Take 1 tablet (40 mg total) by mouth daily. Needs fasting appointment. Dr. Tobie Poet    Dispense:  90 tablet    Refill:  0  . metoprolol tartrate (LOPRESSOR) 50 MG tablet    Sig: Take 1 tablet (50 mg total) by mouth 2 (two) times daily.    Dispense:  180 tablet    Refill:  0  . rosuvastatin (CRESTOR) 40 MG tablet    Sig: Take 1 tablet (40 mg total) by mouth daily.    Dispense:  90 tablet    Refill:  0    Orders Placed This Encounter  Procedures  . CBC with Differential/Platelet  . Lipid panel  . Comprehensive metabolic panel  . Ambulatory referral to Gastroenterology  . Ambulatory referral to General Surgery    I strongly recommend the covid 19 vaccinations (pfizer or moderna.)  Please also continue to practice social distancing, mask wearing, and hand washing. Please call if you develop symptoms of COVID  (fever, cough, shortness of breath, congestion, aches, loss of taset/smell, headaches) regardless of vaccination status..  I spent 30 minutes dedicated to the care of this patient on the date of this encounter to include face-to-face time with the patient, as well as: review of chart/labs. Discussion about getting covid 19 vaccinations. Pt agreed to call health department or get at [facility].   Follow-up: Return in about 6 weeks (around 03/29/2020) for fasting.  An After Visit Summary was printed and given to the patient.  [name], MD [facility] [phone]

## 2020-02-16 NOTE — Patient Instructions (Signed)
I strongly recommend the covid 19 vaccinations (pfizer or moderna.)  Please also continue to practice social distancing, mask wearing, and hand washing. Please call if you develop symptoms of COVID (fever, cough, shortness of breath, congestion, aches, loss of taset/smell, headaches) regardless of vaccination status.Marland Kitchen

## 2020-02-25 DIAGNOSIS — R222 Localized swelling, mass and lump, trunk: Secondary | ICD-10-CM | POA: Diagnosis not present

## 2020-03-16 DIAGNOSIS — Z1159 Encounter for screening for other viral diseases: Secondary | ICD-10-CM | POA: Diagnosis not present

## 2020-03-16 DIAGNOSIS — Z1152 Encounter for screening for COVID-19: Secondary | ICD-10-CM | POA: Diagnosis not present

## 2020-03-22 DIAGNOSIS — R222 Localized swelling, mass and lump, trunk: Secondary | ICD-10-CM | POA: Diagnosis not present

## 2020-03-22 DIAGNOSIS — F17210 Nicotine dependence, cigarettes, uncomplicated: Secondary | ICD-10-CM | POA: Diagnosis not present

## 2020-03-22 DIAGNOSIS — Z87891 Personal history of nicotine dependence: Secondary | ICD-10-CM | POA: Diagnosis not present

## 2020-03-22 DIAGNOSIS — D171 Benign lipomatous neoplasm of skin and subcutaneous tissue of trunk: Secondary | ICD-10-CM | POA: Diagnosis not present

## 2020-03-22 DIAGNOSIS — I2510 Atherosclerotic heart disease of native coronary artery without angina pectoris: Secondary | ICD-10-CM | POA: Diagnosis not present

## 2020-03-22 DIAGNOSIS — Z8673 Personal history of transient ischemic attack (TIA), and cerebral infarction without residual deficits: Secondary | ICD-10-CM | POA: Diagnosis not present

## 2020-03-22 DIAGNOSIS — E782 Mixed hyperlipidemia: Secondary | ICD-10-CM | POA: Diagnosis not present

## 2020-03-22 DIAGNOSIS — Z79899 Other long term (current) drug therapy: Secondary | ICD-10-CM | POA: Diagnosis not present

## 2020-03-22 DIAGNOSIS — D216 Benign neoplasm of connective and other soft tissue of trunk, unspecified: Secondary | ICD-10-CM | POA: Diagnosis not present

## 2020-03-22 DIAGNOSIS — J449 Chronic obstructive pulmonary disease, unspecified: Secondary | ICD-10-CM | POA: Diagnosis not present

## 2020-03-22 DIAGNOSIS — Z880 Allergy status to penicillin: Secondary | ICD-10-CM | POA: Diagnosis not present

## 2020-03-22 DIAGNOSIS — Z7902 Long term (current) use of antithrombotics/antiplatelets: Secondary | ICD-10-CM | POA: Diagnosis not present

## 2020-03-30 DIAGNOSIS — Z09 Encounter for follow-up examination after completed treatment for conditions other than malignant neoplasm: Secondary | ICD-10-CM | POA: Diagnosis not present

## 2020-08-22 NOTE — Progress Notes (Signed)
Established Patient Office Visit  Subjective:  Patient ID: Scott Foley, male    DOB: [birth year]  Age: 55 y.o. MRN: [id number]  CC: CAD   HPI Scott Foley is a 55 year old Caucasian male that presents for follow-up of CAD, HTN, COPD, and hyperlipidemia. He has a past history of CVA and nicotine dependence. He is accompanied by his significant other today. He is not up-to-date on routine dental or eye exams; he has not had screening colonoscopy.   He was last seen for hypertension 6 months ago.  BP at that visit was 128/76. Management since that visit includes Norvasc 5 mg, Zestril 40 mg, and Lopressor 50 mg.  He reports excellent compliance with treatment. He is not having side effects.  He is following a Regular diet. He is not exercising. He does smoke.Vapes nicotine  Use of agents associated with hypertension: none.   Outside blood pressures are not being checked. Symptoms: No chest pain No chest pressure  No palpitations No syncope  No dyspnea No orthopnea  No paroxysmal nocturnal dyspnea No lower extremity edema   Pertinent labs: Lab Results  Component Value Date   CHOL 240 (H) 12/15/2019   HDL 30 (L) 12/15/2019   LDLCALC 171 (H) 12/15/2019   TRIG 208 (H) 12/15/2019   CHOLHDL 7.0 (H) 07/28/2019   Lab Results  Component Value Date   NA 140 12/15/2019   K 4.8 12/15/2019   CREATININE 0.94 12/15/2019   GFRNONAA 91 12/15/2019   GFRAA 105 12/15/2019   GLUCOSE 86 12/15/2019     COPD, Follow up  He was last seen for this 6 months ago. Changes made include .   He reports good compliance with treatment. He is not having side effects.  he uses rescue inhaler 2 per weeks. He IS experiencing fatigue. He is NOT experiencing cough. he reports breathing is Unchanged.  Coronary artery disease, follow up  The patient was last seen for CAD on 02/16/20. Changes made at last visit include Eliquis 5 mg, Plavix 75 mg, Crestor 40 mg, Lopressor 50 mg BID,  Zestril 40 mg, and Norvasc 5 mg. He is not taking daily aspirin.  He reports excellent compliance with treatment. He is not having side effects.  He is not having to take nitroglycerine. He is experiencing none. He is not experiencing none. He is able to carry groceries,     is able to climb stairs,      is able to cut grass,      is able to work in the yard without having above symptoms.  His last vist with his cardiologist was 2020 with Dr Sidney Ace.  Lipid Panel     Component Value Date/Time   CHOL 240 (H) 12/15/2019 1034   TRIG 208 (H) 12/15/2019 1034   HDL 30 (L) 12/15/2019 1034   CHOLHDL 7.0 (H) 07/28/2019 1051   LDLCALC 171 (H) 12/15/2019 7829   Last metabolic panel Lab Results  Component Value Date   GLUCOSE 86 12/15/2019   NA 140 12/15/2019   K 4.8 12/15/2019   CL 104 12/15/2019   CO2 21 12/15/2019   BUN 14 12/15/2019   CREATININE 0.94 12/15/2019   GFRNONAA 91 12/15/2019   GFRAA 105 12/15/2019   CALCIUM 10.0 12/15/2019   PROT 7.0 12/15/2019   ALBUMIN 4.4 12/15/2019   LABGLOB 2.6 12/15/2019   AGRATIO 1.7 12/15/2019   BILITOT 0.3 12/15/2019   ALKPHOS 92 12/15/2019   AST 15 12/15/2019   ALT 21 12/15/2019  ANIONGAP 10 04/26/2019     Past Medical History:  Diagnosis Date   CAD (coronary artery disease) 2012   [facility] No heart cath, treated medically [facility]   History of stroke    Hyperlipemia    Hypertension    PE (pulmonary thromboembolism) ([place])     Past Surgical History:  Procedure Laterality Date   TONSILLECTOMY      Family History  Problem Relation Age of Onset   Hypertension Father    Heart attack Father    Stroke Father    Prostate cancer Father    Hypertension Sister    Hypertension Brother    Heart attack Brother    Lung cancer Sister     Social History   Socioeconomic History   Marital status: Married    Spouse name: Not on file   Number of children: Not on file   Years of education: Not on file   Highest education level: Not on  file  Occupational History   Not on file  Tobacco Use   Smoking status: Former    Packs/day: 0.50    Pack years: 0.00    Types: Cigarettes    Start date: 03/14/1979    Quit date: 07/28/2018    Years since quitting: 2.0   Smokeless tobacco: Never  Vaping Use   Vaping Use: Every day   Start date: 07/28/2018   Devices: 6 mg daily.  Substance and Sexual Activity   Alcohol use: No   Drug use: No   Sexual activity: Not on file  Other Topics Concern   Not on file  Social History Narrative   Not on file   Social Determinants of Health   Financial Resource Strain: Not on file  Food Insecurity: Not on file  Transportation Needs: Not on file  Physical Activity: Not on file  Stress: Not on file  Social Connections: Not on file  Intimate Partner Violence: Not on file    Outpatient Medications Prior to Visit  Medication Sig Dispense Refill   amLODipine (NORVASC) 5 MG tablet Take 1 tablet (5 mg total) by mouth daily. 90 tablet 0   apixaban (ELIQUIS) 5 MG TABS tablet Take 1 tablet (5 mg total) by mouth 2 (two) times daily. 90 tablet 0   clopidogrel (PLAVIX) 75 MG tablet Take 1 tablet (75 mg total) by mouth daily. 90 tablet 0   lisinopril (ZESTRIL) 40 MG tablet Take 1 tablet (40 mg total) by mouth daily. Needs fasting appointment. Dr. Tobie Poet 90 tablet 0   metoprolol tartrate (LOPRESSOR) 50 MG tablet Take 1 tablet (50 mg total) by mouth 2 (two) times daily. 180 tablet 0   Omega-3 Fatty Acids (FISH OIL) 1000 MG CAPS Take by mouth 2 (two) times daily.     rosuvastatin (CRESTOR) 40 MG tablet Take 1 tablet (40 mg total) by mouth daily. 90 tablet 0   No facility-administered medications prior to visit.    Allergies  Allergen Reactions   Penicillin G Hives    ROS Review of Systems  Constitutional:  Negative for appetite change, fatigue and unexpected weight change.  HENT:  Negative for congestion, ear pain, rhinorrhea, sinus pressure, sinus pain and tinnitus.   Eyes:  Negative for pain.   Respiratory:  Negative for cough and shortness of breath.   Cardiovascular:  Negative for chest pain, palpitations and leg swelling.  Gastrointestinal:  Negative for abdominal pain, constipation, diarrhea, nausea and vomiting.  Endocrine: Negative for cold intolerance, heat intolerance, polydipsia, polyphagia and  polyuria.  Genitourinary:  Negative for dysuria, frequency and hematuria.  Musculoskeletal:  Negative for arthralgias, back pain, joint swelling and myalgias.  Skin:  Negative for rash.  Allergic/Immunologic: Negative for environmental allergies.  Neurological:  Negative for dizziness and headaches.  Hematological:  Negative for adenopathy.  Psychiatric/Behavioral:  Negative for decreased concentration and sleep disturbance. The patient is not nervous/anxious.      Objective:    Physical Exam Vitals reviewed.  Constitutional:      Appearance: Normal appearance.  HENT:     Head: Normocephalic.     Right Ear: Tympanic membrane normal.     Left Ear: Tympanic membrane normal.     Mouth/Throat:     Mouth: Mucous membranes are moist.     Dentition: Dental caries present.  Cardiovascular:     Rate and Rhythm: Normal rate and regular rhythm.     Pulses: Normal pulses.     Heart sounds: Normal heart sounds.  Pulmonary:     Effort: Pulmonary effort is normal.     Breath sounds: Normal breath sounds.  Abdominal:     General: Bowel sounds are normal.     Palpations: Abdomen is soft.  Musculoskeletal:        General: Normal range of motion.  Skin:    General: Skin is warm and dry.     Capillary Refill: Capillary refill takes less than 2 seconds.  Neurological:     General: No focal deficit present.     Mental Status: He is alert and oriented to person, place, and time.  Psychiatric:        Mood and Affect: Mood normal.        Behavior: Behavior normal.   BP 122/90 (BP Location: Left Arm, Patient Position: Sitting)   Pulse 64   Temp (!) 97.4 F (36.3 C) (Temporal)    Ht 5\' 4"  (1.626 m)   Wt 204 lb (92.5 kg)   SpO2 95%   BMI 35.02 kg/m  Wt Readings from Last 3 Encounters:  08/23/20 204 lb (92.5 kg)  02/16/20 202 lb (91.6 kg)  12/15/19 200 lb (90.7 kg)     Health Maintenance Due  Topic Date Due   COVID-19 Vaccine (1) Never done   Pneumococcal Vaccine 62-68 Years old (1 - PCV) Never done   HIV Screening  Never done   Hepatitis C Screening  Never done   TETANUS/TDAP  Never done   Zoster Vaccines- Shingrix (1 of 2) Never done   COLONOSCOPY (Pts 45-12yrs Insurance coverage will need to be confirmed)  Never done    Lab Results  Component Value Date   WBC 8.9 07/28/2019   HGB 17.6 07/28/2019   HCT 54.0 (H) 07/28/2019   MCV 91 07/28/2019   PLT 297 07/28/2019   Lab Results  Component Value Date   NA 140 12/15/2019   K 4.8 12/15/2019   CO2 21 12/15/2019   GLUCOSE 86 12/15/2019   BUN 14 12/15/2019   CREATININE 0.94 12/15/2019   BILITOT 0.3 12/15/2019   ALKPHOS 92 12/15/2019   AST 15 12/15/2019   ALT 21 12/15/2019   PROT 7.0 12/15/2019   ALBUMIN 4.4 12/15/2019   CALCIUM 10.0 12/15/2019   ANIONGAP 10 04/26/2019   Lab Results  Component Value Date   CHOL 240 (H) 12/15/2019   Lab Results  Component Value Date   HDL 30 (L) 12/15/2019   Lab Results  Component Value Date   LDLCALC 171 (H) 12/15/2019   Lab Results  Component Value Date   TRIG 208 (H) 12/15/2019   Lab Results  Component Value Date   CHOLHDL 7.0 (H) 07/28/2019     Assessment & Plan:   Problem List Items Addressed This Visit    1. Mixed hyperlipidemia - Lipid panel  2. Essential hypertension - CBC with Differential/Platelet - Comprehensive metabolic panel - TSH  3. Chronic obstructive pulmonary disease, unspecified COPD type ([place]) - CBC with Differential/Platelet - Comprehensive metabolic panel  4. Coronary artery disease involving native coronary artery of native heart, unspecified whether angina present - CBC with Differential/Platelet -  Comprehensive metabolic panel - Lipid panel  5. Encounter for screening for malignant neoplasm of prostate - PSA  6. Anticoagulant long-term use -fall precautions -Continue Eliquis 5 mg daily -continue Plavix 75 mg daily  7. Encounter for screening for malignant neoplasm of colon - Ambulatory referral to Gastroenterology  -Pt prefers GI near [place] routine eye and dental exam Recommend quit vaping Recommend increase physical activity and heart healthy diet We will call you with GI referral for [place]  Monitor BP at home, notify office if elevated Continue medications Follow-up fasting in 29-months  .   Follow-up: 40-months, fasting with Dr Marlyce Huge , Fonnie Mu as a scribe for Rip Harbour, NP.,have documented all relevant documentation on the behalf of Rip Harbour, NP,as directed by  Rip Harbour, NP while in the presence of Rip Harbour, NP.   I, Rip Harbour, NP, have reviewed all documentation for this visit. The documentation on 08/23/20 for the exam, diagnosis, procedures, and orders are all accurate and complete.   Signed,  Jerrell Belfast, DNP

## 2020-08-23 ENCOUNTER — Ambulatory Visit: Payer: Medicare HMO | Admitting: Family Medicine

## 2020-08-23 ENCOUNTER — Ambulatory Visit (INDEPENDENT_AMBULATORY_CARE_PROVIDER_SITE_OTHER): Payer: Medicare HMO | Admitting: Nurse Practitioner

## 2020-08-23 ENCOUNTER — Other Ambulatory Visit: Payer: Self-pay

## 2020-08-23 ENCOUNTER — Encounter: Payer: Self-pay | Admitting: Nurse Practitioner

## 2020-08-23 ENCOUNTER — Other Ambulatory Visit: Payer: Self-pay | Admitting: Nurse Practitioner

## 2020-08-23 VITALS — BP 122/90 | HR 64 | Temp 97.4°F | Ht 64.0 in | Wt 204.0 lb

## 2020-08-23 DIAGNOSIS — Z125 Encounter for screening for malignant neoplasm of prostate: Secondary | ICD-10-CM

## 2020-08-23 DIAGNOSIS — J449 Chronic obstructive pulmonary disease, unspecified: Secondary | ICD-10-CM | POA: Diagnosis not present

## 2020-08-23 DIAGNOSIS — I2782 Chronic pulmonary embolism: Secondary | ICD-10-CM

## 2020-08-23 DIAGNOSIS — I119 Hypertensive heart disease without heart failure: Secondary | ICD-10-CM

## 2020-08-23 DIAGNOSIS — E782 Mixed hyperlipidemia: Secondary | ICD-10-CM

## 2020-08-23 DIAGNOSIS — I10 Essential (primary) hypertension: Secondary | ICD-10-CM | POA: Diagnosis not present

## 2020-08-23 DIAGNOSIS — I2510 Atherosclerotic heart disease of native coronary artery without angina pectoris: Secondary | ICD-10-CM | POA: Diagnosis not present

## 2020-08-23 DIAGNOSIS — Z1211 Encounter for screening for malignant neoplasm of colon: Secondary | ICD-10-CM | POA: Diagnosis not present

## 2020-08-23 DIAGNOSIS — Z7901 Long term (current) use of anticoagulants: Secondary | ICD-10-CM

## 2020-08-23 MED ORDER — LISINOPRIL 40 MG PO TABS: 40.0000 mg | ORAL_TABLET | Freq: Every day | ORAL | 0 refills | Status: DC

## 2020-08-23 MED ORDER — ROSUVASTATIN CALCIUM 40 MG PO TABS: 40.0000 mg | ORAL_TABLET | Freq: Every day | ORAL | 0 refills | Status: DC

## 2020-08-23 MED ORDER — FISH OIL 1000 MG PO CAPS
1.0000 | ORAL_CAPSULE | Freq: Two times a day (BID) | ORAL | 0 refills | Status: DC
Start: 2020-08-23 — End: 2020-11-30

## 2020-08-23 MED ORDER — CLOPIDOGREL BISULFATE 75 MG PO TABS: 75.0000 mg | ORAL_TABLET | Freq: Every day | ORAL | 0 refills | Status: DC

## 2020-08-23 MED ORDER — METOPROLOL TARTRATE 50 MG PO TABS: 50.0000 mg | ORAL_TABLET | Freq: Two times a day (BID) | ORAL | 0 refills | Status: DC

## 2020-08-23 MED ORDER — ELIQUIS 5 MG PO TABS: 5.0000 mg | ORAL_TABLET | Freq: Two times a day (BID) | ORAL | 0 refills | Status: DC

## 2020-08-23 MED ORDER — AMLODIPINE BESYLATE 5 MG PO TABS
5.0000 mg | ORAL_TABLET | Freq: Every day | ORAL | 0 refills | Status: DC
Start: 2020-08-23 — End: 2020-12-01

## 2020-08-23 NOTE — Patient Instructions (Signed)
Recommend routine eye and dental exam Recommend quit vaping Recommend increase physical activity and heart healthy diet We will call you with GI referral for [place]  Monitor BP at home, notify office if elevated Continue medications Follow-up fasting in 17-months  Electronic Cigarette Information  Electronic cigarettes, or e-cigarettes, are battery-operated devices that deliver nicotine--a very addictive drug--to the body. They come in many shapes, including in the shape of a cigarette, pipe, pen, and even a USB memory stick. E-cigarettes have a cartridge that contains a liquid form of nicotine. When a person uses the device, the liquid heats up. It then becomes a vapor. Inhalingthis vapor is called vaping. Nicotine is thought to increase your risk for certain types of cancer. In addition to nicotine, e-cigarettes may contain other harmful and cancer-causing chemicals, including: Formaldehyde. Acetaldehyde. Heavy metals. Ultra-fine particles that can get inhaled deep into the lungs. Chemical colorings and flavorings. It is not clear how much nicotine you get when vaping, and it is hard to know what chemicals are in the vaping liquids. The health effects of vaping are not completely known, but you should be aware of the possible dangers of usingthese products. Some people may use e-cigarettes in order to quit smoking tobacco. However, this has not been proven to work, and the Transport planner (FDA)has not approved e-cigarettes for this purpose. How can using electronic cigarettes affect me? You may be at risk for developing a dangerous lung disease. There are reports of an increasing number of cases involving serious lung problems, and even death, associated with e-cigarette use. Your risk may be even higher if you: Buy e-cigarettes or vaping oils off the street. Add any substances to the e-cigarettes that are not intended by the manufacturer. Vaping may make you crave  nicotine. Nicotine does the following: Changes your blood sugar levels. Increases your heart rate, blood pressure, and breathing rate. Increases your risk of developing blood clots (hypercoagulable state) and diabetes. Increases your risk of gum disease that may lead to losing teeth. If you smoke e-cigarettes, you may be more likely to start smoking or to smoke more tobacco cigarettes. Becoming addicted to nicotine may make your brain more sensitive to other addictive drugs. You may move to other addictive substances. You may be in danger of overdosing on nicotine. Nicotine poisoning can cause nausea, vomiting, seizures, and trouble breathing. An e-cigarette may explode and cause fires and burn injuries. If you are pregnant, the nicotine in e-cigarettes may be harmful to your baby. Nicotine can cause: Brain or lung problems for your baby. Your baby to be born too early. Your baby to be born with a low birth weight. Vaping has also been linked to decreases in memory and attention span in children and teens. What actions can I take to stop vaping? If you can, stop vaping on your own before you become addicted to nicotine. If you need help quitting, ask your health care provider. There are three effective ways to fight nicotine addiction: Nicotine replacement therapy. Using nicotine gum or a nicotine patch blocks your craving for nicotine. Over time, you can reduce the amount of nicotine you use until you can stop using nicotine completely without having cravings. Prescription medicines approved to fight nicotine addiction. These stop nicotine cravings or block the effects of nicotine. Behavioral therapy. This may include: A self-help smoking cessation program. Individual or group therapy. A smoking cessation support group. There are several national programs to help you quit smoking or vaping. These include:  Text message programs, such as SmokefreeTXT. Apps for mobile phones, including the free  quitSTART app. Hotlines, such as 1-800-QUIT-NOW 571-187-8941). Where to find support You can get support at these sites: U.S. Department of Health and Human Services: https://smokefree.gov American Lung Association: www.lung.org Where to find more information Learn more about e-cigarettes from: [employer] on Drug Abuse: motorcyclefax.com Centers for Disease Control and Prevention: http://www.wolf.info/ Summary E-cigarettes can cause nicotine addiction. E-cigarettes are not approved as a way to stop smoking. They are not a risk-free alternative to smoking tobacco. There are reports of an increasing number of cases involving serious lung problems, and even death, associated with e-cigarette use. If you can stop vaping on your own, do it before you become addicted to nicotine. If you need help quitting, ask your health care provider. There are various methods and programs that can help you stop smoking or vaping. This information is not intended to replace advice given to you by your health care provider. Make sure you discuss any questions you have with your healthcare provider. Document Revised: 06/25/2018 Document Reviewed: 05/10/2018 Elsevier Patient Education  [place]. [email or web address]">  DASH Eating Plan DASH stands for Dietary Approaches to Stop Hypertension. The DASH eating plan is a healthy eating plan that has been shown to: Reduce high blood pressure (hypertension). Reduce your risk for type 2 diabetes, heart disease, and stroke. Help with weight loss. What are tips for following this plan? Reading food labels Check food labels for the amount of salt (sodium) per serving. Choose foods with less than 5 percent of the Daily Value of sodium. Generally, foods with less than 300 milligrams (mg) of sodium per serving fit into this eating plan. To find whole grains, look for the word "whole" as the first word in the ingredient  list. Shopping Buy products labeled as "low-sodium" or "no salt added." Buy fresh foods. Avoid canned foods and pre-made or frozen meals. Cooking Avoid adding salt when cooking. Use salt-free seasonings or herbs instead of table salt or sea salt. Check with your health care provider or pharmacist before using salt substitutes. Do not fry foods. Cook foods using healthy methods such as baking, boiling, grilling, roasting, and broiling instead. Cook with heart-healthy oils, such as olive, canola, avocado, soybean, or sunflower oil. Meal planning  Eat a balanced diet that includes: 4 or more servings of fruits and 4 or more servings of vegetables each day. Try to fill one-half of your plate with fruits and vegetables. 6-8 servings of whole grains each day. Less than 6 oz (170 g) of lean meat, poultry, or fish each day. A 3-oz (85-g) serving of meat is about the same size as a deck of cards. One egg equals 1 oz (28 g). 2-3 servings of low-fat dairy each day. One serving is 1 cup (237 mL). 1 serving of nuts, seeds, or beans 5 times each week. 2-3 servings of heart-healthy fats. Healthy fats called omega-3 fatty acids are found in foods such as walnuts, flaxseeds, fortified milks, and eggs. These fats are also found in cold-water fish, such as sardines, salmon, and mackerel. Limit how much you eat of: Canned or prepackaged foods. Food that is high in trans fat, such as some fried foods. Food that is high in saturated fat, such as fatty meat. Desserts and other sweets, sugary drinks, and other foods with added sugar. Full-fat dairy products. Do not salt foods before eating. Do not eat more than 4 egg yolks a week. Try to eat at  least 2 vegetarian meals a week. Eat more home-cooked food and less restaurant, buffet, and fast food.  Lifestyle When eating at a restaurant, ask that your food be prepared with less salt or no salt, if possible. If you drink alcohol: Limit how much you use to: 0-1  drink a day for women who are not pregnant. 0-2 drinks a day for men. Be aware of how much alcohol is in your drink. In the [place] one drink equals one 12 oz bottle of beer (355 mL), one 5 oz glass of wine (148 mL), or one 1 oz glass of hard liquor (44 mL). General information Avoid eating more than 2,300 mg of salt a day. If you have hypertension, you may need to reduce your sodium intake to 1,500 mg a day. Work with your health care provider to maintain a healthy body weight or to lose weight. Ask what an ideal weight is for you. Get at least 30 minutes of exercise that causes your heart to beat faster (aerobic exercise) most days of the week. Activities may include walking, swimming, or biking. Work with your health care provider or dietitian to adjust your eating plan to your individual calorie needs. What foods should I eat? Fruits All fresh, dried, or frozen fruit. Canned fruit in natural juice (without addedsugar). Vegetables Fresh or frozen vegetables (raw, steamed, roasted, or grilled). Low-sodium or reduced-sodium tomato and vegetable juice. Low-sodium or reduced-sodium tomatosauce and tomato paste. Low-sodium or reduced-sodium canned vegetables. Grains Whole-grain or whole-wheat bread. Whole-grain or whole-wheat pasta. Brown rice. [place]. Bulgur. Whole-grain and low-sodium cereals. Pita bread.Low-fat, low-sodium crackers. Whole-wheat flour tortillas. Meats and other proteins Skinless chicken or [place]. Ground chicken or [place]. Pork with fat trimmed off. Fish and seafood. Egg whites. Dried beans, peas, or lentils. Unsalted nuts, nut butters, and seeds. Unsalted canned beans. Lean cuts of beef with fat trimmed off. Low-sodium, lean precooked or cured meat, such as sausages or meatloaves. Dairy Low-fat (1%) or fat-free (skim) milk. Reduced-fat, low-fat, or fat-free cheeses. Nonfat, low-sodium ricotta or cottage cheese. Low-fat or nonfatyogurt. Low-fat, low-sodium cheese. Fats and  oils Soft margarine without trans fats. Vegetable oil. Reduced-fat, low-fat, or light mayonnaise and salad dressings (reduced-sodium). Canola, safflower, olive, avocado, soybean, andsunflower oils. Avocado. Seasonings and condiments Herbs. Spices. Seasoning mixes without salt. Other foods Unsalted popcorn and pretzels. Fat-free sweets. The items listed above may not be a complete list of foods and beverages you can eat. Contact a dietitian for more information. What foods should I avoid? Fruits Canned fruit in a light or heavy syrup. Fried fruit. Fruit in cream or buttersauce. Vegetables Creamed or fried vegetables. Vegetables in a cheese sauce. Regular canned vegetables (not low-sodium or reduced-sodium). Regular canned tomato sauce and paste (not low-sodium or reduced-sodium). Regular tomato and vegetable juice(not low-sodium or reduced-sodium). Angie Fava. Olives. Grains Baked goods made with fat, such as croissants, muffins, or some breads. Drypasta or rice meal packs. Meats and other proteins Fatty cuts of meat. Ribs. Fried meat. Berniece Salines. Bologna, salami, and other precooked or cured meats, such as sausages or meat loaves. Fat from the back of a pig (fatback). Bratwurst. Salted nuts and seeds. Canned beans with added salt. Canned orsmoked fish. Whole eggs or egg yolks. Chicken or [place] with skin. Dairy Whole or 2% milk, cream, and half-and-half. Whole or full-fat cream cheese. Whole-fat or sweetened yogurt. Full-fat cheese. Nondairy creamers. Whippedtoppings. Processed cheese and cheese spreads. Fats and oils Butter. Stick margarine. Lard. Shortening. Ghee. Bacon fat. Tropical oils, suchas  coconut, palm kernel, or palm oil. Seasonings and condiments Onion salt, garlic salt, seasoned salt, table salt, and sea salt. Worcestershire sauce. Tartar sauce. Barbecue sauce. Teriyaki sauce. Soy sauce, including reduced-sodium. Steak sauce. Canned and packaged gravies. Fish sauce. Oyster sauce. Cocktail  sauce. Store-bought horseradish. Ketchup. Mustard. Meat flavorings and tenderizers. Bouillon cubes. Hot sauces. Pre-made or packaged marinades. Pre-made or packaged taco seasonings. Relishes. Regular saladdressings. Other foods Salted popcorn and pretzels. The items listed above may not be a complete list of foods and beverages you should avoid. Contact a dietitian for more information. Where to find more information National Heart, Lung, and Blood Institute: https://wilson-eaton.com/ American Heart Association: www.heart.org Academy of Nutrition and Dietetics: www.eatright.[place]: www.kidney.org Summary The DASH eating plan is a healthy eating plan that has been shown to reduce high blood pressure (hypertension). It may also reduce your risk for type 2 diabetes, heart disease, and stroke. When on the DASH eating plan, aim to eat more fresh fruits and vegetables, whole grains, lean proteins, low-fat dairy, and heart-healthy fats. With the DASH eating plan, you should limit salt (sodium) intake to 2,300 mg a day. If you have hypertension, you may need to reduce your sodium intake to 1,500 mg a day. Work with your health care provider or dietitian to adjust your eating plan to your individual calorie needs. This information is not intended to replace advice given to you by your health care provider. Make sure you discuss any questions you have with your healthcare provider. Document Revised: 01/31/2019 Document Reviewed: 01/31/2019 Elsevier Patient Education  2022 [employer].

## 2020-08-24 LAB — COMPREHENSIVE METABOLIC PANEL
ALT: 23 IU/L (ref 0–44)
AST: 10 IU/L (ref 0–40)
Albumin/Globulin Ratio: 1.7 (ref 1.2–2.2)
Albumin: 4.5 g/dL (ref 3.8–4.9)
Alkaline Phosphatase: 112 IU/L (ref 44–121)
BUN/Creatinine Ratio: 16 (ref 9–20)
BUN: 19 mg/dL (ref 6–24)
Bilirubin Total: 0.4 mg/dL (ref 0.0–1.2)
CO2: 20 mmol/L (ref 20–29)
Calcium: 10.3 mg/dL — ABNORMAL HIGH (ref 8.7–10.2)
Chloride: 101 mmol/L (ref 96–106)
Creatinine, Ser: 1.16 mg/dL (ref 0.76–1.27)
Globulin, Total: 2.7 g/dL (ref 1.5–4.5)
Glucose: 86 mg/dL (ref 65–99)
Potassium: 4.8 mmol/L (ref 3.5–5.2)
Sodium: 138 mmol/L (ref 134–144)
Total Protein: 7.2 g/dL (ref 6.0–8.5)
eGFR: 74 mL/min/{1.73_m2} (ref >59)

## 2020-08-24 LAB — LIPID PANEL
Chol/HDL Ratio: 6.5 ratio — ABNORMAL HIGH (ref 0.0–5.0)
Cholesterol, Total: 226 mg/dL — ABNORMAL HIGH (ref 100–199)
HDL: 35 mg/dL — ABNORMAL LOW (ref >39)
LDL Chol Calc (NIH): 156 mg/dL — ABNORMAL HIGH (ref 0–99)
Triglycerides: 192 mg/dL — ABNORMAL HIGH (ref 0–149)
VLDL Cholesterol Cal: 35 mg/dL (ref 5–40)

## 2020-08-24 LAB — CBC WITH DIFFERENTIAL/PLATELET
Basophils Absolute: 0.1 10*3/uL (ref 0.0–0.2)
Basos: 1 %
EOS (ABSOLUTE): 0.2 10*3/uL (ref 0.0–0.4)
Eos: 2 %
Hematocrit: 50.2 % (ref 37.5–51.0)
Hemoglobin: 17.1 g/dL (ref 13.0–17.7)
Immature Grans (Abs): 0 10*3/uL (ref 0.0–0.1)
Immature Granulocytes: 0 %
Lymphocytes Absolute: 3.2 10*3/uL — ABNORMAL HIGH (ref 0.7–3.1)
Lymphs: 33 %
MCH: 30.8 pg (ref 26.6–33.0)
MCHC: 34.1 g/dL (ref 31.5–35.7)
MCV: 90 fL (ref 79–97)
Monocytes Absolute: 0.8 10*3/uL (ref 0.1–0.9)
Monocytes: 8 %
Neutrophils Absolute: 5.4 10*3/uL (ref 1.4–7.0)
Neutrophils: 56 %
Platelets: 319 10*3/uL (ref 150–450)
RBC: 5.56 x10E6/uL (ref 4.14–5.80)
RDW: 13.6 % (ref 11.6–15.4)
WBC: 9.6 10*3/uL (ref 3.4–10.8)

## 2020-08-24 LAB — CARDIOVASCULAR RISK ASSESSMENT

## 2020-08-24 LAB — TSH: TSH: 2.39 u[IU]/mL (ref 0.450–4.500)

## 2020-08-24 LAB — PSA: Prostate Specific Ag, Serum: 1.4 ng/mL (ref 0.0–4.0)

## 2020-09-22 ENCOUNTER — Telehealth (INDEPENDENT_AMBULATORY_CARE_PROVIDER_SITE_OTHER): Payer: Self-pay | Admitting: Gastroenterology

## 2020-09-22 DIAGNOSIS — Z1211 Encounter for screening for malignant neoplasm of colon: Secondary | ICD-10-CM

## 2020-09-22 MED ORDER — NA SULFATE-K SULFATE-MG SULF 17.5-3.13-1.6 GM/177ML PO SOLN: 1.0000 | Freq: Once | ORAL | 0 refills | Status: AC

## 2020-09-22 NOTE — Progress Notes (Signed)
Gastroenterology Pre-Procedure Review  Request Date: 10/25/2020  Requesting Physician: Dr. Vicente Males   PATIENT REVIEW QUESTIONS: The patient responded to the following health history questions as indicated:    1. Are you having any GI issues? no 2. Do you have a personal history of Polyps? no 3. Do you have a family history of Colon Cancer or Polyps? yes (Father had colon polyps) 4. Diabetes Mellitus? no 5. Joint replacements in the past 12 months?no 6. Major health problems in the past 3 months?no 7. Any artificial heart valves, MVP, or defibrillator? No     MEDICATIONS & ALLERGIES:    Patient reports the following regarding taking any anticoagulation/antiplatelet therapy:   Plavix, Coumadin, Eliquis, Xarelto, Lovenox, Pradaxa, Brilinta, or Effient? yes (Plavix 75 mg and Eliquis 48m ) Aspirin? no  Patient confirms/reports the following medications:  Current Outpatient Medications  Medication Sig Dispense Refill   amLODipine (NORVASC) 5 MG tablet Take 1 tablet (5 mg total) by mouth daily. 90 tablet 0   apixaban (ELIQUIS) 5 MG TABS tablet Take 1 tablet (5 mg total) by mouth 2 (two) times daily. 90 tablet 0   clopidogrel (PLAVIX) 75 MG tablet Take 1 tablet (75 mg total) by mouth daily. 90 tablet 0   lisinopril (ZESTRIL) 40 MG tablet Take 1 tablet (40 mg total) by mouth daily. Needs fasting appointment. Dr. CTobie Poet90 tablet 0   metoprolol tartrate (LOPRESSOR) 50 MG tablet Take 1 tablet (50 mg total) by mouth 2 (two) times daily. 180 tablet 0   Na Sulfate-K Sulfate-Mg Sulf 17.5-3.13-1.6 GM/177ML SOLN Take 1 kit by mouth once for 1 dose. 354 mL 0   Omega-3 Fatty Acids (FISH OIL) 1000 MG CAPS Take 1 capsule (1,000 mg total) by mouth 2 (two) times daily. 180 capsule 0   rosuvastatin (CRESTOR) 40 MG tablet Take 1 tablet (40 mg total) by mouth daily. 90 tablet 0   No current facility-administered medications for this visit.    Patient confirms/reports the following allergies:  Allergies  Allergen  Reactions   Penicillin G Hives    No orders of the defined types were placed in this encounter.   AUTHORIZATION INFORMATION Primary Insurance: 1D#: Group #:  Secondary Insurance: 1D#: Group #:  SCHEDULE INFORMATION: Date: 10/25/2020  Time: [place]

## 2020-10-06 ENCOUNTER — Telehealth: Payer: Self-pay

## 2020-10-06 NOTE — Telephone Encounter (Signed)
Called to inform him that Dr. Tobie Poet has sent the blood thinner request back. He is to stop Eliquis 2 days before and Plavix 5-7 days before procedure. Patient verbalized understanding.

## 2020-10-25 ENCOUNTER — Encounter
Admission: RE | Disposition: A | Discharge status: Home / Self Care | Payer: Self-pay | Source: Home / Self Care | Attending: Gastroenterology

## 2020-10-25 ENCOUNTER — Ambulatory Visit
Admission: RE | Admit: 2020-10-25 | Discharge: 2020-10-25 | Disposition: A | Discharge status: Home / Self Care | Payer: Medicare HMO | Attending: Gastroenterology | Admitting: Gastroenterology

## 2020-10-25 ENCOUNTER — Ambulatory Visit: Payer: Medicare HMO | Admitting: Anesthesiology

## 2020-10-25 DIAGNOSIS — Z7901 Long term (current) use of anticoagulants: Secondary | ICD-10-CM | POA: Diagnosis not present

## 2020-10-25 DIAGNOSIS — Z720 Tobacco use: Secondary | ICD-10-CM | POA: Diagnosis not present

## 2020-10-25 DIAGNOSIS — Z8673 Personal history of transient ischemic attack (TIA), and cerebral infarction without residual deficits: Secondary | ICD-10-CM | POA: Insufficient documentation

## 2020-10-25 DIAGNOSIS — K5730 Diverticulosis of large intestine without perforation or abscess without bleeding: Secondary | ICD-10-CM

## 2020-10-25 DIAGNOSIS — Z7902 Long term (current) use of antithrombotics/antiplatelets: Secondary | ICD-10-CM | POA: Diagnosis not present

## 2020-10-25 DIAGNOSIS — Z8371 Family history of colonic polyps: Secondary | ICD-10-CM | POA: Insufficient documentation

## 2020-10-25 DIAGNOSIS — Z87891 Personal history of nicotine dependence: Secondary | ICD-10-CM | POA: Diagnosis not present

## 2020-10-25 DIAGNOSIS — K635 Polyp of colon: Secondary | ICD-10-CM | POA: Diagnosis not present

## 2020-10-25 DIAGNOSIS — Z86711 Personal history of pulmonary embolism: Secondary | ICD-10-CM | POA: Insufficient documentation

## 2020-10-25 DIAGNOSIS — D122 Benign neoplasm of ascending colon: Secondary | ICD-10-CM | POA: Insufficient documentation

## 2020-10-25 DIAGNOSIS — K5790 Diverticulosis of intestine, part unspecified, without perforation or abscess without bleeding: Secondary | ICD-10-CM | POA: Diagnosis not present

## 2020-10-25 DIAGNOSIS — Z880 Allergy status to penicillin: Secondary | ICD-10-CM | POA: Insufficient documentation

## 2020-10-25 DIAGNOSIS — Z79899 Other long term (current) drug therapy: Secondary | ICD-10-CM | POA: Insufficient documentation

## 2020-10-25 DIAGNOSIS — I69311 Memory deficit following cerebral infarction: Secondary | ICD-10-CM | POA: Diagnosis not present

## 2020-10-25 DIAGNOSIS — Z1211 Encounter for screening for malignant neoplasm of colon: Secondary | ICD-10-CM | POA: Diagnosis not present

## 2020-10-25 HISTORY — PX: COLONOSCOPY WITH PROPOFOL: SHX5780

## 2020-10-25 HISTORY — DX: Diverticulosis of large intestine without perforation or abscess without bleeding: K57.30

## 2020-10-25 HISTORY — DX: Polyp of colon: K63.5

## 2020-10-25 SURGERY — COLONOSCOPY WITH PROPOFOL
Anesthesia: General

## 2020-10-25 MED ORDER — PROPOFOL 500 MG/50ML IV EMUL
INTRAVENOUS | Status: DC | PRN
  Administered 2020-10-25: 125 ug/kg/min via INTRAVENOUS

## 2020-10-25 MED ORDER — PHENYLEPHRINE HCL (PRESSORS) 10 MG/ML IV SOLN
INTRAVENOUS | Status: DC | PRN
  Administered 2020-10-25: 100 ug via INTRAVENOUS
  Administered 2020-10-25: 50 ug via INTRAVENOUS

## 2020-10-25 MED ORDER — LIDOCAINE HCL (CARDIAC) PF 100 MG/5ML IV SOSY
PREFILLED_SYRINGE | INTRAVENOUS | Status: DC | PRN
  Administered 2020-10-25: 50 mg via INTRAVENOUS

## 2020-10-25 MED ORDER — PROPOFOL 10 MG/ML IV BOLUS
INTRAVENOUS | Status: DC | PRN
  Administered 2020-10-25: 40 mg via INTRAVENOUS
  Administered 2020-10-25 (×2): 20 mg via INTRAVENOUS

## 2020-10-25 MED ORDER — SODIUM CHLORIDE 0.9 % IV SOLN
INTRAVENOUS | Status: DC
  Administered 2020-10-25: 20 mL/h via INTRAVENOUS

## 2020-10-25 NOTE — H&P (Signed)
Scott Bellows, MD [address] [place], [place], [place], [address] [address], [place], [place], [place], [address] Phone: [phone]  Fax: [phone]  Primary Care Physician:  [name], MD   Pre-Procedure History & Physical: HPI:  Scott Foley is a 55 y.o. male is here for an colonoscopy.   Past Medical History:  Diagnosis Date   CAD (coronary artery disease) 2012   AMI No heart cath, treated medically Scott Foley   History of stroke    Hyperlipemia    Hypertension    PE (pulmonary thromboembolism) ([place])     Past Surgical History:  Procedure Laterality Date   TONSILLECTOMY      Prior to Admission medications   Medication Sig Start Date End Date Taking? Authorizing Provider  amLODipine (NORVASC) 5 MG tablet Take 1 tablet (5 mg total) by mouth daily. 08/23/20  Yes Rip Harbour, NP  apixaban (ELIQUIS) 5 MG TABS tablet Take 1 tablet (5 mg total) by mouth 2 (two) times daily. 08/23/20  Yes Rip Harbour, NP  clopidogrel (PLAVIX) 75 MG tablet Take 1 tablet (75 mg total) by mouth daily. 08/23/20  Yes Rip Harbour, NP  lisinopril (ZESTRIL) 40 MG tablet Take 1 tablet (40 mg total) by mouth daily. Needs fasting appointment. Dr. Tobie Poet 08/23/20  Yes Heaton, Thornell Mule, NP  metoprolol tartrate (LOPRESSOR) 50 MG tablet Take 1 tablet (50 mg total) by mouth 2 (two) times daily. 08/23/20  Yes Rip Harbour, NP  Omega-3 Fatty Acids (FISH OIL) 1000 MG CAPS Take 1 capsule (1,000 mg total) by mouth 2 (two) times daily. 08/23/20  Yes Rip Harbour, NP  rosuvastatin (CRESTOR) 40 MG tablet Take 1 tablet (40 mg total) by mouth daily. 08/23/20  Yes Rip Harbour, NP    Allergies as of 09/22/2020 - Review Complete 09/22/2020  Allergen Reaction Noted   Penicillin g Hives 07/16/2018    Family History  Problem Relation Age of Onset   Hypertension Father    Heart attack Father    Stroke Father    Prostate cancer Father    Hypertension Sister     Hypertension Brother    Heart attack Brother    Lung cancer Sister     Social History   Socioeconomic History   Marital status: Married    Spouse name: Not on file   Number of children: Not on file   Years of education: Not on file   Highest education level: Not on file  Occupational History   Not on file  Tobacco Use   Smoking status: Former    Packs/day: 0.50    Types: Cigarettes    Start date: 03/14/1979    Quit date: 07/28/2018    Years since quitting: 2.2   Smokeless tobacco: Never  Vaping Use   Vaping Use: Every day   Start date: 07/28/2018   Devices: 6 mg daily.  Substance and Sexual Activity   Alcohol use: No   Drug use: No   Sexual activity: Not on file  Other Topics Concern   Not on file  Social History Narrative   Not on file   Social Determinants of Health   Financial Resource Strain: Not on file  Food Insecurity: Not on file  Transportation Needs: Not on file  Physical Activity: Not on file  Stress: Not on file  Social Connections: Not on file  Intimate Partner Violence: Not on file    Review of Systems: See HPI,  otherwise negative ROS  Physical Exam: BP (!) 182/108   Pulse 99   Temp (!) 96 F (35.6 C) (Temporal)   Resp 20   Ht '5\' 4"'$  (1.626 m)   Wt 93 kg   SpO2 96%   BMI 35.19 kg/m  General:   Alert,  pleasant and cooperative in NAD Head:  Normocephalic and atraumatic. Neck:  Supple; no masses or thyromegaly. Lungs:  Clear throughout to auscultation, normal respiratory effort.    Heart:  +S1, +S2, Regular rate and rhythm, No edema. Abdomen:  Soft, nontender and nondistended. Normal bowel sounds, without guarding, and without rebound.   Neurologic:  Alert and  oriented x4;  grossly normal neurologically.  Impression/Plan: Scott Foley is here for an colonoscopy to be performed for Screening colonoscopy , father had colon polyps Risks, benefits, limitations, and alternatives regarding  colonoscopy have been reviewed with the  patient.  Questions have been answered.  All parties agreeable.   Scott Bellows, MD  10/25/2020, 7:57 AM

## 2020-10-25 NOTE — Transfer of Care (Signed)
Immediate Anesthesia Transfer of Care Note  Patient: Scott Foley  Procedure(s) Performed: COLONOSCOPY WITH PROPOFOL  Patient Location: PACU and Endoscopy Unit  Anesthesia Type:General  Level of Consciousness: drowsy  Airway & Oxygen Therapy: Patient Spontanous Breathing  Post-op Assessment: Report given to RN and Post -op Vital signs reviewed and stable  Post vital signs: Reviewed and stable  Last Vitals:  Vitals Value Taken Time  BP 115/87 10/25/20 0837  Temp 35.9 C 10/25/20 0837  Pulse 77 10/25/20 0837  Resp 21 10/25/20 0837  SpO2 94 % 10/25/20 0837    Last Pain:  Vitals:   10/25/20 0837  TempSrc: Tympanic  PainSc: Asleep         Complications: No notable events documented.

## 2020-10-25 NOTE — Anesthesia Postprocedure Evaluation (Signed)
Anesthesia Post Note  Patient: Scott Foley  Procedure(s) Performed: COLONOSCOPY WITH PROPOFOL  Patient location during evaluation: Phase II Anesthesia Type: General Level of consciousness: awake and alert, awake and oriented Pain management: pain level controlled Vital Signs Assessment: post-procedure vital signs reviewed and stable Respiratory status: spontaneous breathing, nonlabored ventilation and respiratory function stable Cardiovascular status: blood pressure returned to baseline and stable Postop Assessment: no apparent nausea or vomiting Anesthetic complications: no   No notable events documented.   Last Vitals:  Vitals:   10/25/20 0857 10/25/20 0907  BP: (!) 145/97 126/69  Pulse: 77 69  Resp: 20 20  Temp:    SpO2: 96% 96%    Last Pain:  Vitals:   10/25/20 0907  TempSrc:   PainSc: 0-No pain                 Phill Mutter

## 2020-10-25 NOTE — Op Note (Signed)
[facility] Gastroenterology Patient Name: Scott Foley Procedure Date: 10/25/2020 8:03 AM MRN: [id number] Account #: [id number] Date of Birth: October 29, 1965 Admit Type: Outpatient Age: 55 Room: [facility] ENDO ROOM 4 Gender: Male Note Status: Finalized Procedure:             Colonoscopy Indications:           Screening for colon cancer: Family history of colon                         polyps in distant relative(s) Providers:             Jonathon Bellows MD, MD Medicines:             Monitored Anesthesia Care Complications:         No immediate complications. Procedure:             Pre-Anesthesia Assessment:                        - Prior to the procedure, a History and Physical was                         performed, and patient medications, allergies and                         sensitivities were reviewed. The patient's tolerance                         of previous anesthesia was reviewed.                        - The risks and benefits of the procedure and the                         sedation options and risks were discussed with the                         patient. All questions were answered and informed                         consent was obtained.                        - ASA Grade Assessment: II - A patient with mild                         systemic disease.                        After obtaining informed consent, the colonoscope was                         passed under direct vision. Throughout the procedure,                         the patient's blood pressure, pulse, and oxygen                         saturations were monitored continuously. The  Colonoscope was introduced through the anus and                         advanced to the the cecum, identified by the                         appendiceal orifice. The colonoscopy was performed                         with ease. The patient tolerated the procedure well.                         The  quality of the bowel preparation was excellent. Findings:      The perianal and digital rectal examinations were normal.      Multiple small and large-mouthed diverticula were found in the entire       colon.      A 25 mm polyp was found in the proximal ascending colon. The polyp was       sessile. Preparations were made for mucosal resection. Eleview was       injected to raise the lesion. Forceps and snare mucosal resection was       performed. Resection and retrieval were complete. To prevent bleeding       after mucosal resection, five hemostatic clips were successfully placed.       There was no bleeding during, or at the end, of the procedure. Polyp       resected piecemeal , borders of the polyp marked using blue/NBI light      The exam was otherwise without abnormality on direct and retroflexion       views. Impression:            - Diverticulosis in the entire examined colon.                        - One 25 mm polyp in the proximal ascending colon,                         removed with mucosal resection. Resected and                         retrieved. Clips were placed.                        - The examination was otherwise normal on direct and                         retroflexion views.                        - Mucosal resection was performed. Resection and                         retrieval were complete. Recommendation:        - Discharge patient to home (with escort).                        - Resume previous diet.                        -  Continue present medications.                        - Await pathology results.                        - Repeat colonoscopy in 6 months for surveillance                         after piecemeal polypectomy.                        - Resume plavix and eloquis in 3 days Procedure Code(s):     --- Professional ---                        [phone], Colonoscopy, flexible; with endoscopic mucosal                         resection Diagnosis Code(s):      --- Professional ---                        Z12.11, Encounter for screening for malignant neoplasm                         of colon                        Z83.71, Family history of colonic polyps                        K63.5, Polyp of colon                        K57.30, Diverticulosis of large intestine without                         perforation or abscess without bleeding CPT copyright 2019 American Medical Association. All rights reserved. The codes documented in this report are preliminary and upon coder review may  be revised to meet current compliance requirements. Jonathon Bellows, MD Jonathon Bellows MD, MD 10/25/2020 8:37:00 AM This report has been signed electronically. Number of Addenda: 0 Note Initiated On: 10/25/2020 8:03 AM Scope Withdrawal Time: 0 hours 23 minutes 39 seconds  Total Procedure Duration: 0 hours 26 minutes 41 seconds  Estimated Blood Loss:  Estimated blood loss: none.      [facility]

## 2020-10-25 NOTE — Anesthesia Preprocedure Evaluation (Signed)
Anesthesia Evaluation  Patient identified by MRN, date of birth, ID band Patient awake    Reviewed: Allergy & Precautions, NPO status , Patient's Chart, lab work & pertinent test results  Airway Mallampati: II  TM Distance: >3 FB Neck ROM: Full    Dental  (+) Poor Dentition, Missing, Loose   Pulmonary COPD, former smoker,    Pulmonary exam normal        Cardiovascular hypertension, Pt. on medications + CAD and + DVT  Normal cardiovascular exam     Neuro/Psych Memory Loss with CVA CVA, Residual Symptoms negative psych ROS   GI/Hepatic negative GI ROS, Neg liver ROS,   Endo/Other  negative endocrine ROS  Renal/GU negative Renal ROS  negative genitourinary   Musculoskeletal negative musculoskeletal ROS (+)   Abdominal   Peds negative pediatric ROS (+)  Hematology negative hematology ROS (+)   Anesthesia Other Findings CAD (coronary artery disease) 2012 AMI No heart cath, treated medically [facility]  History of stroke    Hyperlipemia    Hypertension    PE (pulmonary thromboembolism) (HCC)       Reproductive/Obstetrics negative OB ROS                             Anesthesia Physical Anesthesia Plan  ASA: 3  Anesthesia Plan: General   Post-op Pain Management:    Induction: Intravenous  PONV Risk Score and Plan: 2 and Propofol infusion and TIVA  Airway Management Planned: Nasal Cannula  Additional Equipment:   Intra-op Plan:   Post-operative Plan:   Informed Consent:   Plan Discussed with: CRNA, Surgeon and Anesthesiologist  Anesthesia Plan Comments:         Anesthesia Quick Evaluation

## 2020-10-26 ENCOUNTER — Encounter: Payer: Self-pay | Admitting: Gastroenterology

## 2020-10-26 LAB — SURGICAL PATHOLOGY

## 2020-11-03 ENCOUNTER — Encounter: Payer: Self-pay | Admitting: Gastroenterology

## 2020-11-25 ENCOUNTER — Encounter: Payer: Self-pay | Admitting: Family Medicine

## 2020-11-25 ENCOUNTER — Other Ambulatory Visit: Payer: Self-pay

## 2020-11-25 ENCOUNTER — Ambulatory Visit (INDEPENDENT_AMBULATORY_CARE_PROVIDER_SITE_OTHER): Payer: Medicare HMO | Admitting: Family Medicine

## 2020-11-25 VITALS — BP 130/78 | HR 74 | Resp 18 | Ht 64.0 in | Wt 204.2 lb

## 2020-11-25 DIAGNOSIS — I25118 Atherosclerotic heart disease of native coronary artery with other forms of angina pectoris: Secondary | ICD-10-CM

## 2020-11-25 DIAGNOSIS — F17200 Nicotine dependence, unspecified, uncomplicated: Secondary | ICD-10-CM | POA: Diagnosis not present

## 2020-11-25 DIAGNOSIS — R5383 Other fatigue: Secondary | ICD-10-CM

## 2020-11-25 DIAGNOSIS — I10 Essential (primary) hypertension: Secondary | ICD-10-CM | POA: Diagnosis not present

## 2020-11-25 DIAGNOSIS — D6869 Other thrombophilia: Secondary | ICD-10-CM | POA: Diagnosis not present

## 2020-11-25 DIAGNOSIS — I2782 Chronic pulmonary embolism: Secondary | ICD-10-CM

## 2020-11-25 DIAGNOSIS — E782 Mixed hyperlipidemia: Secondary | ICD-10-CM | POA: Diagnosis not present

## 2020-11-25 DIAGNOSIS — J410 Simple chronic bronchitis: Secondary | ICD-10-CM

## 2020-11-25 DIAGNOSIS — R059 Cough, unspecified: Secondary | ICD-10-CM | POA: Diagnosis not present

## 2020-11-25 DIAGNOSIS — I119 Hypertensive heart disease without heart failure: Secondary | ICD-10-CM

## 2020-11-25 DIAGNOSIS — K635 Polyp of colon: Secondary | ICD-10-CM

## 2020-11-25 MED ORDER — VARENICLINE TARTRATE 0.5 MG X 11 & 1 MG X 42 PO MISC: ORAL | 0 refills | Status: DC

## 2020-11-25 NOTE — Progress Notes (Signed)
Subjective:  Patient ID: Scott Foley, male    DOB: Apr 09, 1965  Age: 55 y.o. MRN: [id number]  Chief Complaint  Patient presents with   COPD   Hyperlipidemia   Hypertension    HPI COPD: History of COPD. Currently on no inhalers. Pt continues to smoke.   Hyperlipidemia: Complications: stroke/history of myocardial infarction. No sequelae from stroke or MI. Current medications: crestor 40 mg once daily, otc fish oil 1 capsule twice a day, plavix 75 mg once daily.   Pulmonary embolism: currently on eliquis 5 mg one bid.    Hypertension: Complications: stroke/history of myocardial infarction. No sequelae from stroke or MI. Current medications: amlodipine 5 mg once daily, lisinopril 40 mg once daily, metoprolol tartrate 50 mg one twice daily.   Diet: poor Exercise: none. Increased fatigue over the last month. Sleeping well. Not depressed. Not sick. Has a cough x 1-2 months.  Colonoscopy in 10/2020 showed large 25 mm polyp. Diverticular disease. Recommended repeat in 6 months.  No current outpatient medications on file prior to visit.   No current facility-administered medications on file prior to visit.   Past Medical History:  Diagnosis Date   CAD (coronary artery disease) 2012   AMI No heart cath, treated medically [facility]   Chronic obstructive pulmonary disease ([place]) 07/16/2018   Closed rib fracture right 9th and 10th 04/28/2019   Colon polyp 10/25/2020   Coronary artery disease with stable angina pectoris (HCC)    AMI No heart cath, treated medically [facility]   Essential hypertension    History of stroke    Hyperlipemia    Hypertension    Hypertensive heart disease without heart failure 12/06/2020   PE (pulmonary thromboembolism) ([place])    Pulmonary embolus ([place]) 03/03/2019   Tobacco use disorder 11/25/2012   Past Surgical History:  Procedure Laterality Date   COLONOSCOPY WITH PROPOFOL N/A 10/25/2020   Procedure: COLONOSCOPY WITH PROPOFOL;  Surgeon: Jonathon Bellows, MD;   [facility] END[facility]COPY;  Service: Gastroenterology;  Laterality: N/A;   TONSILLECTOMY      Family History  Problem Relation Age of Onset   Hypertension Father    Heart attack Father    Stroke Father    Prostate cancer Father    Hypertension Sister    Hypertension Brother    Heart attack Brother    Lung cancer Sister    Social History   Socioeconomic History   Marital status: Married    Spouse name: Not on file   Number of children: Not on file   Years of education: Not on file   Highest education level: Not on file  Occupational History   Not on file  Tobacco Use   Smoking status: Some Days    Packs/day: 0.75    Types: Cigarettes    Start date: 03/14/1979   Smokeless tobacco: Never   Tobacco comments:    Will start chantix.   Vaping Use   Vaping Use: Every day   Start date: 07/28/2018   Devices: 6 mg daily.  Substance and Sexual Activity   Alcohol use: No   Drug use: No   Sexual activity: Not on file  Other Topics Concern   Not on file  Social History Narrative   Not on file   Social Determinants of Health   Financial Resource Strain: Not on file  Food Insecurity: Not on file  Transportation Needs: Not on file  Physical Activity: Not on file  Stress: Not on file  Social Connections:  Not on file    Review of Systems  Constitutional:  Positive for fatigue. Negative for chills and fever.  HENT:  Negative for congestion, ear pain, rhinorrhea and sore throat.   Respiratory:  Positive for cough. Negative for shortness of breath.   Cardiovascular:  Negative for chest pain, palpitations and leg swelling.  Gastrointestinal:  Negative for abdominal pain, constipation, diarrhea, nausea and vomiting.  Endocrine: Negative for polydipsia, polyphagia and polyuria.  Genitourinary:  Negative for dysuria and urgency.  Musculoskeletal:  Negative for arthralgias, back pain and myalgias.  Neurological:  Negative for dizziness and headaches.  Psychiatric/Behavioral:   Negative for dysphoric mood. The patient is not nervous/anxious.     Objective:  BP 130/78   Pulse 74   Resp 18   Ht '5\' 4"'$  (1.626 m)   Wt 204 lb 3.2 oz (92.6 kg)   BMI 35.05 kg/m   BP/Weight 11/25/2020 10/25/2020 [id number]  Systolic BP [id number]  Diastolic BP 78 69 90  Wt. (Lbs) 204.2 205 204  BMI 35.05 35.19 35.02    Physical Exam Vitals reviewed.  Constitutional:      Appearance: Normal appearance.  Neck:     Vascular: No carotid bruit.  Cardiovascular:     Rate and Rhythm: Normal rate and regular rhythm.     Pulses: Normal pulses.     Heart sounds: Normal heart sounds.  Pulmonary:     Effort: Pulmonary effort is normal.     Breath sounds: Normal breath sounds. No wheezing, rhonchi or rales.  Abdominal:     General: Bowel sounds are normal.     Palpations: Abdomen is soft.     Tenderness: There is no abdominal tenderness.  Neurological:     Mental Status: He is alert.  Psychiatric:        Mood and Affect: Mood normal.        Behavior: Behavior normal.    Diabetic Foot Exam - Simple   No data filed      Lab Results  Component Value Date   WBC 11.4 (H) 11/25/2020   HGB 18.2 (H) 11/25/2020   HCT 53.5 (H) 11/25/2020   PLT 321 11/25/2020   GLUCOSE 85 11/25/2020   CHOL 252 (H) 11/25/2020   TRIG 240 (H) 11/25/2020   HDL 28 (L) 11/25/2020   LDLCALC 178 (H) 11/25/2020   ALT 15 11/25/2020   AST 12 11/25/2020   NA 134 11/25/2020   K 5.1 11/25/2020   CL 99 11/25/2020   CREATININE 1.01 11/25/2020   BUN 20 11/25/2020   CO2 20 11/25/2020   TSH 1.870 11/25/2020   INR 2.0 (H) 04/26/2019      Assessment & Plan:   Problem List Items Addressed This Visit       Cardiovascular and Mediastinum   Coronary artery disease with stable angina pectoris [facility])    Continue medical management. Continue plavix.       RESOLVED: Essential hypertension   Pulmonary embolus (HCC)    Continue eliquis.       Hypertensive heart disease without heart failure    Well  controlled.  No changes to medicines.  Recommend a healthy diet and exercise.          Respiratory   Chronic obstructive pulmonary disease (HCC)    Last PFTs appear to have been done in 2015 by Dr. Joya Gaskins.  Repeat at next visit or consider referral back to pulmonology.       Relevant Medications   varenicline (  CHANTIX STARTING MONTH PAK) 0.5 MG X 11 & 1 MG X 42 tablet     Digestive   Colon polyp    25 mm Polyp removed.  GI recommended repeat colonoscopy in 6 month.         Hematopoietic and Hemostatic   Acquired thrombophilia ([place])    Continue eliquis due to history of PE.         Other   Hyperlipemia - Primary    Change otc fish oil to lovaza 1 gm 2 caps twice a day  Continue statin.      Relevant Orders   Lipid panel (Completed)   Tobacco use disorder    Started on chantix.  Discussed barriers to quitting.      Relevant Medications   varenicline (CHANTIX STARTING MONTH PAK) 0.5 MG X 11 & 1 MG X 42 tablet   RESOLVED: Class 1 obesity due to excess calories with serious comorbidity and body mass index (BMI) of 33.0 to 33.9 in adult   Other fatigue    labs      Cough    Ordered cxr.  Await results and consider ct of chest either for screening for tobacco use or for dx of cause of cough. Consider repeat PFTS. Unsure when pt saw pulmonology last       Relevant Orders   DG Chest 2 View   Morbid obesity [facility])    Recommend continue to work on eating healthy diet and exercise.      .  Meds ordered this encounter  Medications   varenicline (CHANTIX STARTING MONTH PAK) 0.5 MG X 11 & 1 MG X 42 tablet    Sig: Take one 0.5 mg tablet by mouth once daily for 3 days, then increase to one 0.5 mg tablet twice daily for 4 days, then increase to one 1 mg tablet twice daily.    Dispense:  53 tablet    Refill:  0    Orders Placed This Encounter  Procedures   DG Chest 2 View   CBC with Differential/Platelet   Comprehensive metabolic panel   Lipid panel   TSH    Cardiovascular Risk Assessment     Follow-up: Return in about 4 weeks (around 12/23/2020) for chronic follow up.  An After Visit Summary was printed and given to the patient.  [name], MD [facility] [phone]

## 2020-11-26 ENCOUNTER — Other Ambulatory Visit: Payer: Self-pay

## 2020-11-26 LAB — CBC WITH DIFFERENTIAL/PLATELET
Basophils Absolute: 0.1 10*3/uL (ref 0.0–0.2)
Basos: 1 %
EOS (ABSOLUTE): 0.1 10*3/uL (ref 0.0–0.4)
Eos: 1 %
Hematocrit: 53.5 % — ABNORMAL HIGH (ref 37.5–51.0)
Hemoglobin: 18.2 g/dL — ABNORMAL HIGH (ref 13.0–17.7)
Immature Grans (Abs): 0 10*3/uL (ref 0.0–0.1)
Immature Granulocytes: 0 %
Lymphocytes Absolute: 3.8 10*3/uL — ABNORMAL HIGH (ref 0.7–3.1)
Lymphs: 33 %
MCH: 30.6 pg (ref 26.6–33.0)
MCHC: 34 g/dL (ref 31.5–35.7)
MCV: 90 fL (ref 79–97)
Monocytes Absolute: 1 10*3/uL — ABNORMAL HIGH (ref 0.1–0.9)
Monocytes: 9 %
Neutrophils Absolute: 6.5 10*3/uL (ref 1.4–7.0)
Neutrophils: 56 %
Platelets: 321 10*3/uL (ref 150–450)
RBC: 5.94 x10E6/uL — ABNORMAL HIGH (ref 4.14–5.80)
RDW: 13.8 % (ref 11.6–15.4)
WBC: 11.4 10*3/uL — ABNORMAL HIGH (ref 3.4–10.8)

## 2020-11-26 LAB — COMPREHENSIVE METABOLIC PANEL
ALT: 15 IU/L (ref 0–44)
AST: 12 IU/L (ref 0–40)
Albumin/Globulin Ratio: 1.8 (ref 1.2–2.2)
Albumin: 4.9 g/dL (ref 3.8–4.9)
Alkaline Phosphatase: 102 IU/L (ref 44–121)
BUN/Creatinine Ratio: 20 (ref 9–20)
BUN: 20 mg/dL (ref 6–24)
Bilirubin Total: 0.3 mg/dL (ref 0.0–1.2)
CO2: 20 mmol/L (ref 20–29)
Calcium: 10.5 mg/dL — ABNORMAL HIGH (ref 8.7–10.2)
Chloride: 99 mmol/L (ref 96–106)
Creatinine, Ser: 1.01 mg/dL (ref 0.76–1.27)
Globulin, Total: 2.7 g/dL (ref 1.5–4.5)
Glucose: 85 mg/dL (ref 65–99)
Potassium: 5.1 mmol/L (ref 3.5–5.2)
Sodium: 134 mmol/L (ref 134–144)
Total Protein: 7.6 g/dL (ref 6.0–8.5)
eGFR: 87 mL/min/{1.73_m2} (ref >59)

## 2020-11-26 LAB — LIPID PANEL
Chol/HDL Ratio: 9 ratio — ABNORMAL HIGH (ref 0.0–5.0)
Cholesterol, Total: 252 mg/dL — ABNORMAL HIGH (ref 100–199)
HDL: 28 mg/dL — ABNORMAL LOW (ref >39)
LDL Chol Calc (NIH): 178 mg/dL — ABNORMAL HIGH (ref 0–99)
Triglycerides: 240 mg/dL — ABNORMAL HIGH (ref 0–149)
VLDL Cholesterol Cal: 46 mg/dL — ABNORMAL HIGH (ref 5–40)

## 2020-11-26 LAB — TSH: TSH: 1.87 u[IU]/mL (ref 0.450–4.500)

## 2020-11-30 ENCOUNTER — Other Ambulatory Visit: Payer: Self-pay

## 2020-11-30 DIAGNOSIS — E782 Mixed hyperlipidemia: Secondary | ICD-10-CM

## 2020-11-30 MED ORDER — FISH OIL 1000 MG PO CAPS: 1.0000 | ORAL_CAPSULE | Freq: Two times a day (BID) | ORAL | 0 refills | Status: AC

## 2020-12-01 ENCOUNTER — Other Ambulatory Visit: Payer: Self-pay

## 2020-12-01 DIAGNOSIS — I119 Hypertensive heart disease without heart failure: Secondary | ICD-10-CM

## 2020-12-01 DIAGNOSIS — E782 Mixed hyperlipidemia: Secondary | ICD-10-CM

## 2020-12-01 DIAGNOSIS — I2782 Chronic pulmonary embolism: Secondary | ICD-10-CM

## 2020-12-01 MED ORDER — METOPROLOL TARTRATE 50 MG PO TABS: 50.0000 mg | ORAL_TABLET | Freq: Two times a day (BID) | ORAL | 0 refills | Status: DC

## 2020-12-01 MED ORDER — LISINOPRIL 40 MG PO TABS: 40.0000 mg | ORAL_TABLET | Freq: Every day | ORAL | 0 refills | Status: DC

## 2020-12-01 MED ORDER — APIXABAN 5 MG PO TABS: 5.0000 mg | ORAL_TABLET | Freq: Two times a day (BID) | ORAL | 0 refills | Status: DC

## 2020-12-01 MED ORDER — AMLODIPINE BESYLATE 5 MG PO TABS
5.0000 mg | ORAL_TABLET | Freq: Every day | ORAL | 0 refills | Status: DC
Start: 2020-12-01 — End: 2021-02-10

## 2020-12-01 MED ORDER — CLOPIDOGREL BISULFATE 75 MG PO TABS: 75.0000 mg | ORAL_TABLET | Freq: Every day | ORAL | 0 refills | Status: DC

## 2020-12-01 MED ORDER — ROSUVASTATIN CALCIUM 40 MG PO TABS: 40.0000 mg | ORAL_TABLET | Freq: Every day | ORAL | 0 refills | Status: DC

## 2020-12-01 NOTE — Telephone Encounter (Addendum)
Pt called requesting refills on medications that were not sent recently. Sent these meds. Plavix and Eliquis on pt list, will send these to provider for review.   Royce Macadamia, [place] 12/01/20 2:22 PM

## 2020-12-05 ENCOUNTER — Encounter: Payer: Self-pay | Admitting: Family Medicine

## 2020-12-06 ENCOUNTER — Telehealth: Payer: Self-pay

## 2020-12-06 ENCOUNTER — Encounter: Payer: Self-pay | Admitting: Family Medicine

## 2020-12-06 DIAGNOSIS — R5383 Other fatigue: Secondary | ICD-10-CM

## 2020-12-06 DIAGNOSIS — E66812 Obesity, class 2: Secondary | ICD-10-CM | POA: Insufficient documentation

## 2020-12-06 DIAGNOSIS — D6869 Other thrombophilia: Secondary | ICD-10-CM | POA: Insufficient documentation

## 2020-12-06 DIAGNOSIS — I119 Hypertensive heart disease without heart failure: Secondary | ICD-10-CM | POA: Insufficient documentation

## 2020-12-06 DIAGNOSIS — E66813 Obesity, class 3: Secondary | ICD-10-CM

## 2020-12-06 DIAGNOSIS — R059 Cough, unspecified: Secondary | ICD-10-CM | POA: Insufficient documentation

## 2020-12-06 DIAGNOSIS — Z6841 Body Mass Index (BMI) 40.0 and over, adult: Secondary | ICD-10-CM | POA: Insufficient documentation

## 2020-12-06 HISTORY — DX: Other fatigue: R53.83

## 2020-12-06 HISTORY — DX: Morbid (severe) obesity due to excess calories: E66.01

## 2020-12-06 HISTORY — DX: Obesity, class 3: E66.813

## 2020-12-06 HISTORY — DX: Other thrombophilia: D68.69

## 2020-12-06 HISTORY — DX: Hypertensive heart disease without heart failure: I11.9

## 2020-12-06 NOTE — Assessment & Plan Note (Addendum)
Ordered cxr.  Await results and consider ct of chest either for screening for tobacco use or for dx of cause of cough. Consider repeat PFTS. Unsure when pt saw pulmonology last

## 2020-12-06 NOTE — Assessment & Plan Note (Signed)
>>  ASSESSMENT AND PLAN FOR HYPERLIPEMIA WRITTEN ON 12/06/2020 12:22 AM BY [name], [name], MD  Change otc fish oil  to lovaza  1 gm 2 caps twice a day  Continue statin.

## 2020-12-06 NOTE — Assessment & Plan Note (Signed)
Continue eliquis due to history of PE.

## 2020-12-06 NOTE — Assessment & Plan Note (Signed)
Last PFTs appear to have been done in 2015 by Dr. Joya Gaskins.  Repeat at next visit or consider referral back to pulmonology.

## 2020-12-06 NOTE — Assessment & Plan Note (Signed)
labs

## 2020-12-06 NOTE — Assessment & Plan Note (Addendum)
25 mm Polyp removed.  GI recommended repeat colonoscopy in 6 month.

## 2020-12-06 NOTE — Assessment & Plan Note (Signed)
Recommend continue to work on eating healthy diet and exercise.  

## 2020-12-06 NOTE — Assessment & Plan Note (Signed)
Well controlled.  No changes to medicines.  Recommend a healthy diet and exercise.

## 2020-12-06 NOTE — Assessment & Plan Note (Signed)
Continue eliquis  ?

## 2020-12-06 NOTE — Assessment & Plan Note (Signed)
Change otc fish oil to lovaza 1 gm 2 caps twice a day  Continue statin.

## 2020-12-06 NOTE — Assessment & Plan Note (Addendum)
Continue medical management. Continue plavix.

## 2020-12-06 NOTE — Telephone Encounter (Signed)
Called patient to speak with him about seeing hematology,left message for patient to call office back

## 2020-12-06 NOTE — Assessment & Plan Note (Signed)
Started on chantix.  Discussed barriers to quitting.

## 2020-12-07 ENCOUNTER — Other Ambulatory Visit: Payer: Self-pay

## 2020-12-07 DIAGNOSIS — Z122 Encounter for screening for malignant neoplasm of respiratory organs: Secondary | ICD-10-CM

## 2020-12-10 ENCOUNTER — Telehealth: Payer: Self-pay | Admitting: Family Medicine

## 2020-12-10 NOTE — Telephone Encounter (Signed)
   Scott Foley has been scheduled for the following appointment:  WHAT: CT LUNG SCREEN WHERE: [facility] DATE: 12/23/20 TIME: 1:30 PM ARRIVAL TIME  Patient has been made aware.

## 2020-12-23 DIAGNOSIS — Z87891 Personal history of nicotine dependence: Secondary | ICD-10-CM | POA: Diagnosis not present

## 2020-12-23 DIAGNOSIS — F17210 Nicotine dependence, cigarettes, uncomplicated: Secondary | ICD-10-CM | POA: Diagnosis not present

## 2020-12-27 ENCOUNTER — Other Ambulatory Visit: Payer: Self-pay

## 2020-12-27 DIAGNOSIS — I2583 Coronary atherosclerosis due to lipid rich plaque: Secondary | ICD-10-CM

## 2020-12-27 DIAGNOSIS — I2510 Atherosclerotic heart disease of native coronary artery without angina pectoris: Secondary | ICD-10-CM

## 2020-12-30 ENCOUNTER — Other Ambulatory Visit: Payer: Self-pay

## 2020-12-30 ENCOUNTER — Ambulatory Visit (INDEPENDENT_AMBULATORY_CARE_PROVIDER_SITE_OTHER): Payer: Medicare HMO | Admitting: Family Medicine

## 2020-12-30 VITALS — BP 118/78 | HR 83 | Temp 97.2°F | Ht 64.0 in | Wt 209.0 lb

## 2020-12-30 DIAGNOSIS — J410 Simple chronic bronchitis: Secondary | ICD-10-CM

## 2020-12-30 DIAGNOSIS — I2510 Atherosclerotic heart disease of native coronary artery without angina pectoris: Secondary | ICD-10-CM

## 2020-12-30 DIAGNOSIS — F17200 Nicotine dependence, unspecified, uncomplicated: Secondary | ICD-10-CM

## 2020-12-30 DIAGNOSIS — I119 Hypertensive heart disease without heart failure: Secondary | ICD-10-CM

## 2020-12-30 DIAGNOSIS — E782 Mixed hyperlipidemia: Secondary | ICD-10-CM | POA: Diagnosis not present

## 2020-12-30 DIAGNOSIS — I2583 Coronary atherosclerosis due to lipid rich plaque: Secondary | ICD-10-CM | POA: Diagnosis not present

## 2020-12-30 DIAGNOSIS — Z23 Encounter for immunization: Secondary | ICD-10-CM

## 2020-12-30 DIAGNOSIS — I10 Essential (primary) hypertension: Secondary | ICD-10-CM

## 2020-12-30 MED ORDER — REPATHA 140 MG/ML ~~LOC~~ SOSY
140.0000 mg | PREFILLED_SYRINGE | SUBCUTANEOUS | 0 refills | Status: DC
Start: 2020-12-30 — End: 2021-05-19

## 2020-12-30 NOTE — Progress Notes (Signed)
Subjective:  Patient ID: Scott Foley, male    DOB: 04-23-1965  Age: 55 y.o. MRN: [id number]  Chief Complaint  Patient presents with   Hyperlipidemia   Hypertension    HPI Patient is a 55 year old white male with history of hyperlipidemia, COPD, coronary artery disease, history of recurrent pulmonary embolisms who presents for follow-up from his appointment 4 weeks ago.  He has quit smoking with the help of Chantix.  He does not feel short of breath.  He denies chest pain.  His cholesterol was very high at his last visit.  His LDL was 178 on Crestor 40 mg once daily and his triglycerides were 240 on over-the-counter fish oil.  I did change his fish oil to Lovaza 1 g 2 capsules twice daily.  CT scan of his lungs for lung cancer screening showed COPD and coronary calcifications.  Its been sometime since he seen cardiology and we have done a referral to cardiology. His blood pressure is well controlled on his current medications: Metoprolol 50 mg BID, Lisinopril 40 mg daily, Amlodipine 50 mg daily  Current Outpatient Medications on File Prior to Visit  Medication Sig Dispense Refill   amLODipine (NORVASC) 5 MG tablet Take 1 tablet (5 mg total) by mouth daily. 90 tablet 0   apixaban (ELIQUIS) 5 MG TABS tablet Take 1 tablet (5 mg total) by mouth 2 (two) times daily. 90 tablet 0   clopidogrel (PLAVIX) 75 MG tablet Take 1 tablet (75 mg total) by mouth daily. 90 tablet 0   lisinopril (ZESTRIL) 40 MG tablet Take 1 tablet (40 mg total) by mouth daily. 90 tablet 0   metoprolol tartrate (LOPRESSOR) 50 MG tablet Take 1 tablet (50 mg total) by mouth 2 (two) times daily. 180 tablet 0   omega-3 acid ethyl esters (LOVAZA) 1 g capsule Take 1 capsule by mouth 2 (two) times daily.     Omega-3 Fatty Acids (FISH OIL) 1000 MG CAPS Take 1 capsule (1,000 mg total) by mouth 2 (two) times daily. 180 capsule 0   rosuvastatin (CRESTOR) 40 MG tablet Take 1 tablet (40 mg total) by mouth daily. 90 tablet 0   No  current facility-administered medications on file prior to visit.   Past Medical History:  Diagnosis Date   CAD (coronary artery disease) 2012   [facility] No heart cath, treated medically [facility]   Chronic obstructive pulmonary disease ([place]) 07/16/2018   Closed rib fracture right 9th and 10th 04/28/2019   Colon polyp 10/25/2020   Coronary artery disease with stable angina pectoris ([place])    [facility] No heart cath, treated medically [facility]   Essential hypertension    History of stroke    History of stroke 07/16/2018   Formatting of this note might be different from the original. CVA 3 years ago left with short term memory deficits. Has been on Plavix since.   Hyperlipemia    Hypertension    Hypertensive heart disease without heart failure 12/06/2020   PE (pulmonary thromboembolism) ([place])    Pulmonary embolus ([place]) 03/03/2019   Tobacco use disorder 11/25/2012   Past Surgical History:  Procedure Laterality Date   COLONOSCOPY WITH PROPOFOL N/A 10/25/2020   Procedure: COLONOSCOPY WITH PROPOFOL;  Surgeon: Jonathon Bellows, MD;  Location: [facility];  Service: Gastroenterology;  Laterality: N/A;   TONSILLECTOMY      Family History  Problem Relation Age of Onset   Hypertension Father    Heart attack Father    Stroke Father  Prostate cancer Father    Hypertension Sister    Hypertension Brother    Heart attack Brother    Lung cancer Sister    Social History   Socioeconomic History   Marital status: Married    Spouse name: Not on file   Number of children: Not on file   Years of education: Not on file   Highest education level: Not on file  Occupational History   Not on file  Tobacco Use   Smoking status: Former    Packs/day: 0.75    Years: 30.00    Pack years: 22.50    Types: Cigarettes    Start date: 03/14/1979    Quit date: 11/2020    Years since quitting: 0.1   Smokeless tobacco: Never   Tobacco comments:    Will start chantix.   Vaping Use   Vaping Use: Every day   Start date:  07/28/2018   Devices: 6 mg daily.  Substance and Sexual Activity   Alcohol use: No   Drug use: No   Sexual activity: Not on file  Other Topics Concern   Not on file  Social History Narrative   Not on file   Social Determinants of Health   Financial Resource Strain: Not on file  Food Insecurity: Not on file  Transportation Needs: Not on file  Physical Activity: Not on file  Stress: Not on file  Social Connections: Not on file    Review of Systems  Constitutional:  Negative for chills, diaphoresis, fatigue and fever.  HENT:  Negative for congestion, ear pain and sore throat.   Respiratory:  Negative for cough and shortness of breath.   Cardiovascular:  Negative for chest pain and leg swelling.  Gastrointestinal:  Negative for abdominal pain, constipation, diarrhea, nausea and vomiting.  Genitourinary:  Negative for dysuria and urgency.  Musculoskeletal:  Negative for arthralgias, back pain and myalgias.  Neurological:  Negative for dizziness and headaches.  Psychiatric/Behavioral:  Negative for dysphoric mood. The patient is not nervous/anxious.     Objective:  BP 118/78   Pulse 83   Temp (!) 97.2 F (36.2 C)   Ht 5\' 4"  (1.626 m)   Wt 209 lb (94.8 kg)   SpO2 99%   BMI 35.87 kg/m   BP/Weight 12/30/2020 11/25/2020 3/41/9379  Systolic BP 024 097 353  Diastolic BP 78 78 69  Wt. (Lbs) 209 204.2 205  BMI 35.87 35.05 35.19    Physical Exam Vitals reviewed.  Constitutional:      Appearance: Normal appearance.  Cardiovascular:     Rate and Rhythm: Normal rate and regular rhythm.     Heart sounds: Murmur heard.  Pulmonary:     Effort: Pulmonary effort is normal.     Breath sounds: Normal breath sounds. No wheezing, rhonchi or rales.  Neurological:     Mental Status: He is alert.  Psychiatric:        Mood and Affect: Mood normal.        Behavior: Behavior normal.    Diabetic Foot Exam - Simple   No data filed      Lab Results  Component Value Date   WBC  11.4 (H) 11/25/2020   HGB 18.2 (H) 11/25/2020   HCT 53.5 (H) 11/25/2020   PLT 321 11/25/2020   GLUCOSE 85 11/25/2020   CHOL 252 (H) 11/25/2020   TRIG 240 (H) 11/25/2020   HDL 28 (L) 11/25/2020   LDLCALC 178 (H) 11/25/2020   ALT 15 11/25/2020  AST 12 11/25/2020   NA 134 11/25/2020   K 5.1 11/25/2020   CL 99 11/25/2020   CREATININE 1.01 11/25/2020   BUN 20 11/25/2020   CO2 20 11/25/2020   TSH 1.870 11/25/2020   INR 2.0 (H) 04/26/2019      Assessment & Plan:   Problem List Items Addressed This Visit       Cardiovascular and Mediastinum   Hypertensive heart disease without congestive heart failure    The current medical regimen is effective;  continue present plan and medications. Keep cardiology appointment.      Relevant Medications   omega-3 acid ethyl esters (LOVAZA) 1 g capsule   Evolocumab (REPATHA) 140 MG/ML SOSY   Coronary artery disease due to lipid rich plaque    Start Repatha 140 mg once every 2 weeks. Low-fat diet. Keep cardiology appointment.      Relevant Medications   omega-3 acid ethyl esters (LOVAZA) 1 g capsule   Evolocumab (REPATHA) 140 MG/ML SOSY     Respiratory   Chronic obstructive pulmonary disease (HCC)    Good Job - He QUIT SMOKING! Currently not using inhalers.       Relevant Medications   varenicline (CHANTIX CONTINUING MONTH PAK) 1 MG tablet   Other Relevant Orders   Peak flow meter (Completed)     Other   Hyperlipemia - Primary    Start Repatha 140 mg once every 2 weeks. Low-fat diet. Keep cardiology appointment.      Relevant Medications   omega-3 acid ethyl esters (LOVAZA) 1 g capsule   Evolocumab (REPATHA) 140 MG/ML SOSY   Tobacco use disorder    Quit smoking! Recommend continue Chantix for at least 3 months.       Other Visit Diagnoses     Encounter for immunization       Relevant Orders   Flu Vaccine MDCK QUAD PF (Completed)   Need for pneumococcal vaccine       Relevant Orders   Pneumococcal conjugate  vaccine 20-valent (Prevnar 20) (Completed)     .  Meds ordered this encounter  Medications   Evolocumab (REPATHA) 140 MG/ML SOSY    Sig: Inject 140 mg into the skin every 14 (fourteen) days.    Dispense:  6 mL    Refill:  0   varenicline (CHANTIX CONTINUING MONTH PAK) 1 MG tablet    Sig: Take 1 tablet (1 mg total) by mouth 2 (two) times daily.    Dispense:  60 tablet    Refill:  4     Orders Placed This Encounter  Procedures   Peak flow meter   Flu Vaccine MDCK QUAD PF   Pneumococcal conjugate vaccine 20-valent (Prevnar 20)      Follow-up: Return in about 3 months (around 04/01/2021) for chronic fasting.  An After Visit Summary was printed and given to the patient.  [name], MD [facility] [phone]

## 2020-12-30 NOTE — Patient Instructions (Signed)
Great job quit smoking! Continue Chantix 1 mg twice daily for at least 2 more months. Start Repatha 140 mg once every 2 weeks. Low-fat diet. Keep cardiology appointment.

## 2021-01-01 ENCOUNTER — Encounter: Payer: Self-pay | Admitting: Family Medicine

## 2021-01-01 DIAGNOSIS — I2510 Atherosclerotic heart disease of native coronary artery without angina pectoris: Secondary | ICD-10-CM | POA: Insufficient documentation

## 2021-01-01 DIAGNOSIS — I2583 Coronary atherosclerosis due to lipid rich plaque: Secondary | ICD-10-CM

## 2021-01-01 HISTORY — DX: Atherosclerotic heart disease of native coronary artery without angina pectoris: I25.10

## 2021-01-01 HISTORY — DX: Coronary atherosclerosis due to lipid rich plaque: I25.83

## 2021-01-01 MED ORDER — VARENICLINE TARTRATE 1 MG PO TABS: 1.0000 mg | ORAL_TABLET | Freq: Two times a day (BID) | ORAL | 4 refills | Status: DC

## 2021-01-01 NOTE — Assessment & Plan Note (Signed)
The current medical regimen is effective;  continue present plan and medications. Keep cardiology appointment.

## 2021-01-01 NOTE — Assessment & Plan Note (Signed)
>>  ASSESSMENT AND PLAN FOR HYPERLIPEMIA WRITTEN ON 01/01/2021 11:37 PM BY [name], [name], MD  Start Repatha  140 mg once every 2 weeks. Low-fat diet. Keep cardiology appointment.

## 2021-01-01 NOTE — Assessment & Plan Note (Addendum)
Good Job - He QUIT SMOKING! Currently not using inhalers.

## 2021-01-01 NOTE — Assessment & Plan Note (Signed)
Start Repatha 140 mg once every 2 weeks. Low-fat diet. Keep cardiology appointment.

## 2021-01-01 NOTE — Assessment & Plan Note (Signed)
Quit smoking! Recommend continue Chantix for at least 3 months.

## 2021-01-26 ENCOUNTER — Telehealth: Payer: Self-pay

## 2021-01-26 NOTE — Chronic Care Management (AMB) (Signed)
    Chronic Care Management Pharmacy Assistant   Name: Mars Scheaffer  MRN: [id number] DOB: 11-20-1965  Reason for Encounter: Repatha Prior Authorization   01/26/2021- Prior authorization created in Covermymeds for Repatha and submitted to [place].  02/08/2021- Followed back up on Covermymeds, patient's medication for Repatha was approved: PA Case: [id number], Status: Approved, Coverage Starts on: 03/13/2020 12:00:00 AM, Coverage Ends on: 03/12/2022 12:00:00 AM.  Called patient to inform. Patient aware he can pick up Repatha from his pharmacy and it is covered until December 2023.    Medications: Outpatient Encounter Medications as of 01/26/2021  Medication Sig   amLODipine (NORVASC) 5 MG tablet Take 1 tablet (5 mg total) by mouth daily.   apixaban (ELIQUIS) 5 MG TABS tablet Take 1 tablet (5 mg total) by mouth 2 (two) times daily.   clopidogrel (PLAVIX) 75 MG tablet Take 1 tablet (75 mg total) by mouth daily.   Evolocumab (REPATHA) 140 MG/ML SOSY Inject 140 mg into the skin every 14 (fourteen) days.   lisinopril (ZESTRIL) 40 MG tablet Take 1 tablet (40 mg total) by mouth daily.   metoprolol tartrate (LOPRESSOR) 50 MG tablet Take 1 tablet (50 mg total) by mouth 2 (two) times daily.   omega-3 acid ethyl esters (LOVAZA) 1 g capsule Take 1 capsule by mouth 2 (two) times daily.   Omega-3 Fatty Acids (FISH OIL) 1000 MG CAPS Take 1 capsule (1,000 mg total) by mouth 2 (two) times daily.   rosuvastatin (CRESTOR) 40 MG tablet Take 1 tablet (40 mg total) by mouth daily.   varenicline (CHANTIX CONTINUING MONTH PAK) 1 MG tablet Take 1 tablet (1 mg total) by mouth 2 (two) times daily.   No facility-administered encounter medications on file as of 01/26/2021.   Pattricia Boss, [place] Pharmacist Assistant [phone]

## 2021-01-27 ENCOUNTER — Telehealth: Payer: Self-pay

## 2021-01-27 NOTE — Telephone Encounter (Signed)
Received Notification from patient's insurance [employer] that patient has been approved for the Repatha 140mg /ml sureclick dating from 57/03/7791 till 07/26/2021.

## 2021-02-09 ENCOUNTER — Encounter: Payer: Self-pay | Admitting: Emergency Medicine

## 2021-02-09 ENCOUNTER — Observation Stay (HOSPITAL_BASED_OUTPATIENT_CLINIC_OR_DEPARTMENT_OTHER)
Admit: 2021-02-09 | Discharge: 2021-02-09 | Disposition: A | Discharge status: Home / Self Care | Payer: Medicare HMO | Attending: Internal Medicine | Admitting: Internal Medicine

## 2021-02-09 ENCOUNTER — Other Ambulatory Visit: Payer: Self-pay

## 2021-02-09 ENCOUNTER — Observation Stay
Admission: EM | Admit: 2021-02-09 | Discharge: 2021-02-10 | Disposition: A | Discharge status: Home / Self Care | Payer: Medicare HMO | Attending: Internal Medicine | Admitting: Internal Medicine

## 2021-02-09 ENCOUNTER — Emergency Department: Payer: Medicare HMO

## 2021-02-09 ENCOUNTER — Observation Stay (HOSPITAL_BASED_OUTPATIENT_CLINIC_OR_DEPARTMENT_OTHER): Payer: Medicare HMO

## 2021-02-09 DIAGNOSIS — I209 Angina pectoris, unspecified: Secondary | ICD-10-CM | POA: Diagnosis not present

## 2021-02-09 DIAGNOSIS — R079 Chest pain, unspecified: Secondary | ICD-10-CM | POA: Diagnosis not present

## 2021-02-09 DIAGNOSIS — E7800 Pure hypercholesterolemia, unspecified: Secondary | ICD-10-CM | POA: Diagnosis not present

## 2021-02-09 DIAGNOSIS — I2782 Chronic pulmonary embolism: Secondary | ICD-10-CM | POA: Diagnosis not present

## 2021-02-09 DIAGNOSIS — I25110 Atherosclerotic heart disease of native coronary artery with unstable angina pectoris: Secondary | ICD-10-CM | POA: Diagnosis not present

## 2021-02-09 DIAGNOSIS — I200 Unstable angina: Secondary | ICD-10-CM | POA: Diagnosis not present

## 2021-02-09 DIAGNOSIS — Z87891 Personal history of nicotine dependence: Secondary | ICD-10-CM | POA: Diagnosis not present

## 2021-02-09 DIAGNOSIS — Z20822 Contact with and (suspected) exposure to covid-19: Secondary | ICD-10-CM | POA: Diagnosis not present

## 2021-02-09 DIAGNOSIS — I10 Essential (primary) hypertension: Secondary | ICD-10-CM | POA: Diagnosis not present

## 2021-02-09 DIAGNOSIS — Z79899 Other long term (current) drug therapy: Secondary | ICD-10-CM | POA: Diagnosis not present

## 2021-02-09 DIAGNOSIS — I119 Hypertensive heart disease without heart failure: Secondary | ICD-10-CM | POA: Diagnosis present

## 2021-02-09 DIAGNOSIS — R072 Precordial pain: Secondary | ICD-10-CM

## 2021-02-09 DIAGNOSIS — I2510 Atherosclerotic heart disease of native coronary artery without angina pectoris: Secondary | ICD-10-CM | POA: Diagnosis present

## 2021-02-09 DIAGNOSIS — J449 Chronic obstructive pulmonary disease, unspecified: Secondary | ICD-10-CM | POA: Diagnosis present

## 2021-02-09 DIAGNOSIS — I2699 Other pulmonary embolism without acute cor pulmonale: Secondary | ICD-10-CM | POA: Diagnosis present

## 2021-02-09 HISTORY — DX: Chest pain, unspecified: R07.9

## 2021-02-09 LAB — CBC
HCT: 46.9 % (ref 39.0–52.0)
Hemoglobin: 16.2 g/dL (ref 13.0–17.0)
MCH: 30.4 pg (ref 26.0–34.0)
MCHC: 34.5 g/dL (ref 30.0–36.0)
MCV: 88 fL (ref 80.0–100.0)
Platelets: 261 10*3/uL (ref 150–400)
RBC: 5.33 MIL/uL (ref 4.22–5.81)
RDW: 12.8 % (ref 11.5–15.5)
WBC: 11.9 10*3/uL — ABNORMAL HIGH (ref 4.0–10.5)
nRBC: 0 % (ref 0.0–0.2)

## 2021-02-09 LAB — NM MYOCAR MULTI W/SPECT W/WALL MOTION / EF
LV dias vol: 29 mL (ref 62–150)
LV sys vol: 9 mL
MPHR: 164 {beats}/min
Nuc Stress EF: 69 %
Peak HR: 109 {beats}/min
Percent HR: 66 %
Rest HR: 83 {beats}/min
Rest Nuclear Isotope Dose: 10.8 mCi
SDS: 0
SRS: 2
SSS: 1
ST Depression (mm): 0 mm
Stress Nuclear Isotope Dose: 35.9 mCi
TID: 0.92

## 2021-02-09 LAB — COMPREHENSIVE METABOLIC PANEL
ALT: 32 U/L (ref 0–44)
AST: 20 U/L (ref 15–41)
Albumin: 4.1 g/dL (ref 3.5–5.0)
Alkaline Phosphatase: 61 U/L (ref 38–126)
Anion gap: 5 (ref 5–15)
BUN: 17 mg/dL (ref 6–20)
CO2: 27 mmol/L (ref 22–32)
Calcium: 9.5 mg/dL (ref 8.9–10.3)
Chloride: 106 mmol/L (ref 98–111)
Creatinine, Ser: 1.05 mg/dL (ref 0.61–1.24)
GFR, Estimated: >60 mL/min (ref >60)
Glucose, Bld: 117 mg/dL — ABNORMAL HIGH (ref 70–99)
Potassium: 3.7 mmol/L (ref 3.5–5.1)
Sodium: 138 mmol/L (ref 135–145)
Total Bilirubin: 0.7 mg/dL (ref 0.3–1.2)
Total Protein: 7.3 g/dL (ref 6.5–8.1)

## 2021-02-09 LAB — ECHOCARDIOGRAM COMPLETE
AR max vel: 1.74 cm2
AV Area VTI: 2.01 cm2
AV Area mean vel: 1.7 cm2
AV Mean grad: 5 mmHg
AV Peak grad: 8.9 mmHg
Ao pk vel: 1.49 m/s
Area-P 1/2: 2.72 cm2
Calc EF: 74.8 %
Height: 64 in
MV VTI: 1.8 cm2
S' Lateral: 3.37 cm
Single Plane A2C EF: 75.1 %
Single Plane A4C EF: 76.6 %
Weight: 3488 oz

## 2021-02-09 LAB — TROPONIN I (HIGH SENSITIVITY)
Troponin I (High Sensitivity): 4 ng/L (ref <18)
Troponin I (High Sensitivity): 5 ng/L (ref <18)

## 2021-02-09 LAB — RESP PANEL BY RT-PCR (FLU A&B, COVID) ARPGX2
Influenza A by PCR: NEGATIVE
Influenza B by PCR: NEGATIVE
SARS Coronavirus 2 by RT PCR: NEGATIVE

## 2021-02-09 LAB — HIV ANTIBODY (ROUTINE TESTING W REFLEX): HIV Screen 4th Generation wRfx: NONREACTIVE

## 2021-02-09 LAB — LIPASE, BLOOD: Lipase: 29 U/L (ref 11–51)

## 2021-02-09 MED ORDER — CLOPIDOGREL BISULFATE 75 MG PO TABS
75.0000 mg | ORAL_TABLET | Freq: Every day | ORAL | Status: DC
  Administered 2021-02-09 – 2021-02-10 (×2): 75 mg via ORAL
  Filled 2021-02-09 (×2): qty 1

## 2021-02-09 MED ORDER — TECHNETIUM TC 99M TETROFOSMIN IV KIT
30.0000 | PACK | Freq: Once | INTRAVENOUS | Status: AC | PRN
  Administered 2021-02-09: 35.9 via INTRAVENOUS

## 2021-02-09 MED ORDER — OMEGA-3-ACID ETHYL ESTERS 1 G PO CAPS
1.0000 g | ORAL_CAPSULE | Freq: Two times a day (BID) | ORAL | Status: DC
Start: 2021-02-09 — End: 2021-02-09

## 2021-02-09 MED ORDER — OMEGA-3-ACID ETHYL ESTERS 1 G PO CAPS: 1.0000 | ORAL_CAPSULE | Freq: Two times a day (BID) | ORAL | Status: DC

## 2021-02-09 MED ORDER — MORPHINE SULFATE (PF) 4 MG/ML IV SOLN
4.0000 mg | Freq: Once | INTRAVENOUS | Status: AC
  Administered 2021-02-09: 4 mg via INTRAVENOUS
  Filled 2021-02-09: qty 1

## 2021-02-09 MED ORDER — ONDANSETRON HCL 4 MG/2ML IJ SOLN: 4.0000 mg | Freq: Four times a day (QID) | INTRAMUSCULAR | Status: DC | PRN

## 2021-02-09 MED ORDER — PERFLUTREN LIPID MICROSPHERE
1.0000 mL | INTRAVENOUS | Status: AC | PRN
Start: 2021-02-09 — End: 2021-02-09
  Administered 2021-02-09: 2 mL via INTRAVENOUS
  Filled 2021-02-09: qty 10

## 2021-02-09 MED ORDER — FAMOTIDINE IN NACL 20-0.9 MG/50ML-% IV SOLN
20.0000 mg | Freq: Once | INTRAVENOUS | Status: AC
  Administered 2021-02-09: 20 mg via INTRAVENOUS
  Filled 2021-02-09: qty 50

## 2021-02-09 MED ORDER — TECHNETIUM TC 99M TETROFOSMIN IV KIT
10.0000 | PACK | Freq: Once | INTRAVENOUS | Status: AC | PRN
  Administered 2021-02-09: 10.8 via INTRAVENOUS

## 2021-02-09 MED ORDER — NITROGLYCERIN 2 % TD OINT
1.0000 [in_us] | TOPICAL_OINTMENT | Freq: Once | TRANSDERMAL | Status: AC
  Administered 2021-02-09: 1 [in_us] via TOPICAL
  Filled 2021-02-09: qty 1

## 2021-02-09 MED ORDER — AMLODIPINE BESYLATE 5 MG PO TABS
5.0000 mg | ORAL_TABLET | Freq: Every day | ORAL | Status: DC
  Administered 2021-02-09: 5 mg via ORAL
  Filled 2021-02-09: qty 1

## 2021-02-09 MED ORDER — METOPROLOL TARTRATE 50 MG PO TABS
50.0000 mg | ORAL_TABLET | Freq: Two times a day (BID) | ORAL | Status: DC
  Filled 2021-02-09: qty 1

## 2021-02-09 MED ORDER — APIXABAN 5 MG PO TABS
5.0000 mg | ORAL_TABLET | Freq: Two times a day (BID) | ORAL | Status: DC
  Administered 2021-02-09 – 2021-02-10 (×3): 5 mg via ORAL
  Filled 2021-02-09 (×3): qty 1

## 2021-02-09 MED ORDER — ROSUVASTATIN CALCIUM 10 MG PO TABS
40.0000 mg | ORAL_TABLET | Freq: Every day | ORAL | Status: DC
  Administered 2021-02-09: 40 mg via ORAL
  Filled 2021-02-09: qty 4
  Filled 2021-02-09: qty 2

## 2021-02-09 MED ORDER — ONDANSETRON HCL 4 MG/2ML IJ SOLN
4.0000 mg | Freq: Once | INTRAMUSCULAR | Status: AC
  Administered 2021-02-09: 4 mg via INTRAVENOUS
  Filled 2021-02-09: qty 2

## 2021-02-09 MED ORDER — ACETAMINOPHEN 325 MG PO TABS: 650.0000 mg | ORAL_TABLET | ORAL | Status: DC | PRN

## 2021-02-09 MED ORDER — REGADENOSON 0.4 MG/5ML IV SOLN
0.4000 mg | Freq: Once | INTRAVENOUS | Status: AC
Start: 2021-02-09 — End: 2021-02-09
  Administered 2021-02-09: 0.4 mg via INTRAVENOUS

## 2021-02-09 MED ORDER — ISOSORBIDE MONONITRATE ER 30 MG PO TB24
30.0000 mg | ORAL_TABLET | Freq: Every day | ORAL | Status: DC
  Administered 2021-02-09 – 2021-02-10 (×2): 30 mg via ORAL
  Filled 2021-02-09 (×2): qty 1

## 2021-02-09 MED ORDER — LISINOPRIL 20 MG PO TABS
40.0000 mg | ORAL_TABLET | Freq: Every day | ORAL | Status: DC
  Administered 2021-02-09: 40 mg via ORAL
  Filled 2021-02-09: qty 4
  Filled 2021-02-09: qty 2

## 2021-02-09 MED ORDER — ASPIRIN 81 MG PO CHEW
324.0000 mg | CHEWABLE_TABLET | Freq: Once | ORAL | Status: AC
  Administered 2021-02-09: 324 mg via ORAL
  Filled 2021-02-09: qty 4

## 2021-02-09 MED ORDER — VARENICLINE TARTRATE 0.5 MG PO TABS: 1.0000 mg | ORAL_TABLET | Freq: Two times a day (BID) | ORAL | Status: DC

## 2021-02-09 NOTE — ED Notes (Signed)
Lindsay RN aware of assigned bed 

## 2021-02-09 NOTE — ED Notes (Signed)
Patient resting comfortably in bed at this time. No needs expressed to RN.

## 2021-02-09 NOTE — ED Notes (Signed)
Patient back to ED from stress test.

## 2021-02-09 NOTE — ED Notes (Signed)
Patient to stress test at this time.

## 2021-02-09 NOTE — ED Notes (Signed)
Blue top sent to lab with save label.

## 2021-02-09 NOTE — ED Triage Notes (Signed)
Pt arrived via POV with reports of gas pain in epigastric region that radiated to mid chest, pt also had 1 episode of vomiting, denies any shortness of breath, due to see cardiology for initial appointment on Thursday due to blockage recently found.

## 2021-02-09 NOTE — Progress Notes (Signed)
*  PRELIMINARY RESULTS* Echocardiogram 2D Echocardiogram has been performed.  Scott Foley Decaire 02/09/2021, 11:50 AM

## 2021-02-09 NOTE — H&P (Signed)
History and Physical    Scott Foley QPR:[id number] DOB: 1965/11/03 DOA: 02/09/2021  PCP: [name], MD   Patient coming from: Home  I have personally briefly reviewed patient's old medical records in [place]  Chief Complaint: Chest pain  HPI: Scott Foley is a 55 y.o. male with medical history significant for coronary artery disease, hypertension who presents to the emergency room for evaluation of chest pain. Patient states symptoms started at around 11 PM the night prior to his admission and the pain was mostly over the left anterior chest wall.  He rated his pain a 6/10 in intensity at its worst and it was nonradiating.  It was associated with one episode of vomiting as well as epigastric abdominal pain/discomfort.  Pain was not associated with recently eating food.  He denied having any associated diaphoresis, no shortness of breath, no palpitations, no dizziness. At the time of my evaluation he is chest pain-free. He has had no cough, no fever, no chills, no changes in his bowel habits, no leg swelling, no urinary symptoms, no headache, no blurred vision or focal deficit. Sodium 138, potassium 3.7, chloride 106, bicarb 27, glucose 117, BUN 17, creatinine 1.05, calcium 9.5, alkaline phosphatase 61, albumin 4.1, lipase 29, AST 20, ALT 32, total protein 7.3, white count 11.9, hemoglobin 16.2, hematocrit 46.9, MCV 88, RDW 12.8, platelet count 261 Respiratory viral panel is negative Chest x-ray reviewed by me shows no evidence of active cardiopulmonary disease Twelve-lead EKG reviewed by me shows normal sinus rhythm    ED Course: Patient is a 55 year old male who presents to the ER for evaluation of chest pain associated with nausea and abdominal pain.  He has a known history of coronary artery disease. Patient's symptoms resolved following administration of morphine and nitroglycerin. He will be referred to observation status for further  evaluation.     Review of Systems: As per HPI otherwise all other systems reviewed and negative.    Past Medical History:  Diagnosis Date   Acquired thrombophilia ([place]) 12/06/2020   Chronic obstructive pulmonary disease ([place]) 07/16/2018   Class 1 obesity due to excess calories with serious comorbidity and body mass index (BMI) of 33.0 to 33.9 in adult 07/28/2019   Colon polyp 10/25/2020   Coronary artery disease due to lipid rich plaque 01/01/2021   Coronary artery disease with stable angina pectoris ([place])    [facility]I No heart cath, treated medically [facility]   DNR (do not resuscitate) 03/03/2019   Hyperlipemia    Hypertension    Hypertensive heart disease without congestive heart failure 12/06/2020   Morbid obesity ([place]) 12/06/2020   BMI 35 with serious comorbidities including CAD, HTN, STROKE, Hyperlipidemia.   Need for vaccination 12/15/2019   Pulmonary embolus ([place]) 03/03/2019   Pulmonary nodules/lesions, multiple 11/25/2012   9/15/2014CT Chest : small nodule RUL LUL. Stable since 03/2012 Smoking cessation advised and Rx chantix Repeat CT Chest 4 /21/15: subpleural lymph nodes, likely benign repeat one year 2016>>>no change, considered benign Arlyce Harman 11/25/2012  NORMAL   [place], post-stroke 07/28/2019   Tobacco use disorder 11/25/2012    Past Surgical History:  Procedure Laterality Date   COLONOSCOPY WITH PROPOFOL N/A 10/25/2020   Procedure: COLONOSCOPY WITH PROPOFOL;  Surgeon: Jonathon Bellows, MD;  Location[facility] ENDOSCOPY;  Service: Gastroenterology;  Laterality: N/A;   TONSILLECTOMY       reports that he quit smoking about 2 months ago. His smoking use included cigarettes. He started smoking about 41 years ago. He  has a 22.50 pack-year smoking history. He has never used smokeless tobacco. He reports that he does not drink alcohol and does not use drugs.  Allergies  Allergen Reactions   Penicillin G Hives    Family History  Problem Relation Age of Onset   Hypertension Father    Heart  attack Father    Stroke Father    Prostate cancer Father    Hypertension Sister    Hypertension Brother    Heart attack Brother    Lung cancer Sister       Prior to Admission medications   Medication Sig Start Date End Date Taking? Authorizing Provider  amLODipine (NORVASC) 5 MG tablet Take 1 tablet (5 mg total) by mouth daily. 12/01/20   [name], Elnita Maxwell, MD  apixaban (ELIQUIS) 5 MG TABS tablet Take 1 tablet (5 mg total) by mouth 2 (two) times daily. 12/01/20   [name], Elnita Maxwell, MD  clopidogrel (PLAVIX) 75 MG tablet Take 1 tablet (75 mg total) by mouth daily. 12/01/20   [name], [name], MD  Evolocumab (REPATHA) 140 MG/ML SOSY Inject 140 mg into the skin every 14 (fourteen) days. 12/30/20   [name], Elnita Maxwell, MD  lisinopril (ZESTRIL) 40 MG tablet Take 1 tablet (40 mg total) by mouth daily. 12/01/20   CoxElnita Maxwell, MD  metoprolol tartrate (LOPRESSOR) 50 MG tablet Take 1 tablet (50 mg total) by mouth 2 (two) times daily. 12/01/20   CoxElnita Maxwell, MD  omega-3 acid ethyl esters (LOVAZA) 1 g capsule Take 1 capsule by mouth 2 (two) times daily. 11/30/20   [provider]  Omega-3 Fatty Acids (FISH OIL) 1000 MG CAPS Take 1 capsule (1,000 mg total) by mouth 2 (two) times daily. 11/30/20   [name], Elnita Maxwell, MD  rosuvastatin (CRESTOR) 40 MG tablet Take 1 tablet (40 mg total) by mouth daily. 12/01/20   [name], Elnita Maxwell, MD  varenicline (CHANTIX CONTINUING MONTH PAK) 1 MG tablet Take 1 tablet (1 mg total) by mouth 2 (two) times daily. 01/01/21   [name], MD    Physical Exam: Vitals:   02/09/21 0430 02/09/21 0500 02/09/21 0530 02/09/21 0730  BP: (!) 157/114 (!) 164/103 (!) 143/85 132/90  Pulse: (!) 57 63 66 72  Resp: 15 15 (!) 21 16  Temp:      TempSrc:      SpO2: 95% 93% 92% 95%  Weight:      Height:         Vitals:   02/09/21 0430 02/09/21 0500 02/09/21 0530 02/09/21 0730  BP: (!) 157/114 (!) 164/103 (!) 143/85 132/90  Pulse: (!) 57 63 66 72  Resp: 15 15 (!) 21 16  Temp:      TempSrc:      SpO2: 95%  93% 92% 95%  Weight:      Height:          Constitutional: Alert and oriented x 3 . Not in any apparent distress HEENT:      Head: Normocephalic and atraumatic.         Eyes: PERLA, EOMI, Conjunctivae are normal. Sclera is non-icteric.       Mouth/Throat: Mucous membranes are moist.       Neck: Supple with no signs of meningismus. Cardiovascular: Regular rate and rhythm. No murmurs, gallops, or rubs. 2+ symmetrical distal pulses are present . No JVD. No LE edema Respiratory: Respiratory effort normal .Lungs sounds clear bilaterally. No wheezes, crackles, or rhonchi.  Gastrointestinal: Soft, non tender, and non distended with positive bowel sounds.  Genitourinary:  No CVA tenderness. Musculoskeletal: Nontender with normal range of motion in all extremities. No cyanosis, or erythema of extremities. Neurologic:  Face is symmetric. Moving all extremities. No gross focal neurologic deficits . Skin: Skin is warm, dry.  No rash or ulcers Psychiatric: Mood and affect are normal    Labs on Admission: I have personally reviewed following labs and imaging studies  CBC: Recent Labs  Lab 02/09/21 0215  WBC 11.9*  HGB 16.2  HCT 46.9  MCV 88.0  PLT 606   Basic Metabolic Panel: Recent Labs  Lab 02/09/21 0215  NA 138  K 3.7  CL 106  CO2 27  GLUCOSE 117*  BUN 17  CREATININE 1.05  CALCIUM 9.5   GFR: Estimated Creatinine Clearance: 83.4 mL/min (by C-G formula based on SCr of 1.05 mg/dL). Liver Function Tests: Recent Labs  Lab 02/09/21 0215  AST 20  ALT 32  ALKPHOS 61  BILITOT 0.7  PROT 7.3  ALBUMIN 4.1   Recent Labs  Lab 02/09/21 0215  LIPASE 29   No results for input(s): AMMONIA in the last 168 hours. Coagulation Profile: No results for input(s): INR, PROTIME in the last 168 hours. Cardiac Enzymes: No results for input(s): CKTOTAL, CKMB, CKMBINDEX, TROPONINI in the last 168 hours. BNP (last 3 results) No results for input(s): PROBNP in the last 8760  hours. HbA1C: No results for input(s): HGBA1C in the last 72 hours. CBG: No results for input(s): GLUCAP in the last 168 hours. Lipid Profile: No results for input(s): CHOL, HDL, LDLCALC, TRIG, CHOLHDL, LDLDIRECT in the last 72 hours. Thyroid Function Tests: No results for input(s): TSH, T4TOTAL, FREET4, T3FREE, THYROIDAB in the last 72 hours. Anemia Panel: No results for input(s): VITAMINB12, FOLATE, FERRITIN, TIBC, IRON, RETICCTPCT in the last 72 hours. Urine analysis:    Component Value Date/Time   COLORURINE YELLOW (A) 04/26/2019 0903   APPEARANCEUR CLEAR (A) 04/26/2019 0903   LABSPEC 1.023 04/26/2019 0903   PHURINE 6.0 04/26/2019 0903   GLUCOSEU NEGATIVE 04/26/2019 0903   HGBUR NEGATIVE 04/26/2019 0903   BILIRUBINUR NEGATIVE 04/26/2019 0903   KETONESUR NEGATIVE 04/26/2019 0903   PROTEINUR NEGATIVE 04/26/2019 0903   NITRITE NEGATIVE 04/26/2019 0903   LEUKOCYTESUR NEGATIVE 04/26/2019 0903    Radiological Exams on Admission: DG Chest 2 View  Result Date: 02/09/2021 CLINICAL DATA:  Initial evaluation for acute chest pain. EXAM: CHEST - 2 VIEW COMPARISON:  Prior radiograph from 11/25/2020. FINDINGS: Cardiac and mediastinal silhouettes are stable in size and contour, and remain within normal limits. Lungs are hypoinflated. Scattered streaky and patchy bibasilar opacities, right slightly worse than left, similar to previous, and likely reflecting atelectasis and/or scarring. No frank airspace consolidation. No pulmonary edema or pleural effusion. No pneumothorax. Remotely healed right-sided rib fractures noted. No acute osseous finding. IMPRESSION: 1. Shallow lung inflation with associated bibasilar atelectasis and/or scarring, right slightly worse than left. 2. No other active cardiopulmonary disease. Electronically Signed   By: Jeannine Boga M.D.   On: 02/09/2021 02:44     Assessment/Plan Principal Problem:   Chest pain Active Problems:   Pulmonary embolus (HCC)    Chronic obstructive pulmonary disease (HCC)   Hypertensive heart disease without congestive heart failure   Coronary artery disease due to lipid rich plaque     Patient admitted to the hospital for evaluation of chest pain concerning for unstable angina   Chest pain Concerning for unstable angina Patient is currently chest pain-free Continue Plavix, metoprolol and statins We will consult cardiology  Hypertension Blood pressure is stable Continue metoprolol, lisinopril and amlodipine    History of pulmonary embolism Continue Apixaban   DVT prophylaxis: Apixaban Code Status: full code  Family Communication: Greater than 50% of time was spent discussing patient's condition and plan of care with him at the bedside.  All questions and concerns have been addressed.  He verbalizes understanding and agrees with plan. Disposition Plan: Back to previous home environment Consults called: Cardiology  Status:Observation    Collier Bullock MD [facility]     02/09/2021, 8:42 AM

## 2021-02-09 NOTE — ED Provider Notes (Signed)
[facility] Emergency Department Provider Note   ____________________________________________   Event Date/Time   First MD Initiated Contact with Patient 02/09/21 [phone]     (approximate)  I have reviewed the triage vital signs and the nursing notes.   HISTORY  Chief Complaint Chest Pain    HPI Scott Foley is a 55 y.o. male who presents to the ED from home with a chief complaint of chest pain.  Patient with a history of CAD, COPD, hypertension, hyperlipidemia who endorses left-sided chest heaviness approximately 11 PM, radiating to mid chest, associated with 1 episode of vomiting.  Denies associated diaphoresis, shortness of breath, palpitations or dizziness.  Denies recent fever, cough, abdominal pain.  Does state he feels "gassy".  Takes Plavix.  Scheduled to see cardiology for recent blockage.     Past Medical History:  Diagnosis Date   Acquired thrombophilia ([place]) 12/06/2020   Chronic obstructive pulmonary disease ([place]) 07/16/2018   Class 1 obesity due to excess calories with serious comorbidity and body mass index (BMI) of 33.0 to 33.9 in adult 07/28/2019   Colon polyp 10/25/2020   Coronary artery disease due to lipid rich plaque 01/01/2021   Coronary artery disease with stable angina pectoris ([place])    AMI No heart cath, treated medically [place] [facility]   DNR (do not resuscitate) 03/03/2019   Hyperlipemia    Hypertension    Hypertensive heart disease without congestive heart failure 12/06/2020   Morbid obesity ([place]) 12/06/2020   BMI 35 with serious comorbidities including CAD, HTN, STROKE, Hyperlipidemia.   Need for vaccination 12/15/2019   Pulmonary embolus ([place]) 03/03/2019   Pulmonary nodules/lesions, multiple 11/25/2012   9/15/2014CT Chest : small nodule RUL LUL. Stable since 03/2012 Smoking cessation advised and Rx chantix Repeat CT Chest 4 /21/15: subpleural lymph nodes, likely benign repeat one year 2016>>>no change, considered benign Arlyce Harman  11/25/2012  NORMAL   Sequela, post-stroke 07/28/2019   Tobacco use disorder 11/25/2012    Patient Active Problem List   Diagnosis Date Noted   Chest pain 02/09/2021   Coronary artery disease due to lipid rich plaque 01/01/2021   Hypertensive heart disease without congestive heart failure 12/06/2020   Acquired thrombophilia ([place]) 12/06/2020   Morbid obesity ([place]) 12/06/2020   Colon polyp 10/25/2020   Need for vaccination 12/15/2019   Sequela, post-stroke 07/28/2019   Pulmonary embolus ([place]) 03/03/2019   DNR (do not resuscitate) 03/03/2019   Chronic obstructive pulmonary disease ([place]) 07/16/2018   Closed rib fracture right 9th and 10th 07/16/2018   Pulmonary nodules/lesions, multiple 11/25/2012   Tobacco use disorder 11/25/2012   Coronary artery disease with stable angina pectoris ([place])    Hyperlipemia     Past Surgical History:  Procedure Laterality Date   COLONOSCOPY WITH PROPOFOL N/A 10/25/2020   Procedure: COLONOSCOPY WITH PROPOFOL;  Surgeon: Jonathon Bellows, MD;  Location: [facility] ENDOSCOPY;  Service: Gastroenterology;  Laterality: N/A;   TONSILLECTOMY      Prior to Admission medications   Medication Sig Start Date End Date Taking? Authorizing Provider  amLODipine (NORVASC) 5 MG tablet Take 1 tablet (5 mg total) by mouth daily. 12/01/20   [name], Elnita Maxwell, MD  apixaban (ELIQUIS) 5 MG TABS tablet Take 1 tablet (5 mg total) by mouth 2 (two) times daily. 12/01/20   [name], Elnita Maxwell, MD  clopidogrel (PLAVIX) 75 MG tablet Take 1 tablet (75 mg total) by mouth daily. 12/01/20   [name], [name], MD  Evolocumab (REPATHA) 140 MG/ML SOSY Inject 140 mg into the skin  every 14 (fourteen) days. 12/30/20   [name], Elnita Maxwell, MD  lisinopril (ZESTRIL) 40 MG tablet Take 1 tablet (40 mg total) by mouth daily. 12/01/20   CoxElnita Maxwell, MD  metoprolol tartrate (LOPRESSOR) 50 MG tablet Take 1 tablet (50 mg total) by mouth 2 (two) times daily. 12/01/20   CoxElnita Maxwell, MD  omega-3 acid ethyl esters (LOVAZA) 1 g capsule Take 1  capsule by mouth 2 (two) times daily. 11/30/20   [provider]  Omega-3 Fatty Acids (FISH OIL) 1000 MG CAPS Take 1 capsule (1,000 mg total) by mouth 2 (two) times daily. 11/30/20   [name], Elnita Maxwell, MD  rosuvastatin (CRESTOR) 40 MG tablet Take 1 tablet (40 mg total) by mouth daily. 12/01/20   [name], Elnita Maxwell, MD  varenicline (CHANTIX CONTINUING MONTH PAK) 1 MG tablet Take 1 tablet (1 mg total) by mouth 2 (two) times daily. 01/01/21   CoxElnita Maxwell, MD    Allergies Penicillin g  Family History  Problem Relation Age of Onset   Hypertension Father    Heart attack Father    Stroke Father    Prostate cancer Father    Hypertension Sister    Hypertension Brother    Heart attack Brother    Lung cancer Sister     Social History Social History   Tobacco Use   Smoking status: Former    Packs/day: 0.75    Years: 30.00    Pack years: 22.50    Types: Cigarettes    Start date: 03/14/1979    Quit date: 11/2020    Years since quitting: 0.2   Smokeless tobacco: Never   Tobacco comments:    Will start chantix.   Vaping Use   Vaping Use: Every day   Start date: 07/28/2018   Devices: 6 mg daily.  Substance Use Topics   Alcohol use: No   Drug use: No    Review of Systems  Constitutional: No fever/chills Eyes: No visual changes. ENT: No sore throat. Cardiovascular: Positive for chest pain. Respiratory: Denies shortness of breath. Gastrointestinal: No abdominal pain.  Positive for vomiting.  No diarrhea.  No constipation. Genitourinary: Negative for dysuria. Musculoskeletal: Negative for back pain. Skin: Negative for rash. Neurological: Negative for headaches, focal weakness or numbness.   ____________________________________________   PHYSICAL EXAM:  VITAL SIGNS: ED Triage Vitals  Enc Vitals Group     BP 02/09/21 0211 (!) 208/108     Pulse Rate 02/09/21 0211 63     Resp 02/09/21 0211 18     Temp 02/09/21 0211 98.5 F (36.9 C)     Temp Source 02/09/21 0211 Oral     SpO2  02/09/21 0211 96 %     Weight 02/09/21 0210 218 lb (98.9 kg)     Height 02/09/21 0210 5\' 4"  (1.626 m)     Head Circumference --      Peak Flow --      Pain Score 02/09/21 0210 8     Pain Loc --      Pain Edu? --      Excl. in [place]? --     Constitutional: Alert and oriented. Well appearing and in mild acute distress. Eyes: Conjunctivae are normal. PERRL. EOMI. Head: Atraumatic. Nose: No congestion/rhinnorhea. Mouth/Throat: Mucous membranes are moist.   Neck: No stridor.   Cardiovascular: Normal rate, regular rhythm. Grossly normal heart sounds.  Good peripheral circulation. Respiratory: Normal respiratory effort.  No retractions. Lungs CTAB. Gastrointestinal: Soft and nontender to light or deep palpation. No distention. No  abdominal bruits. No CVA tenderness. Musculoskeletal: No lower extremity tenderness nor edema.  No joint effusions. Neurologic:  Normal speech and language. No gross focal neurologic deficits are appreciated. No gait instability. Skin:  Skin is warm, dry and intact. No rash noted. Psychiatric: Mood and affect are normal. Speech and behavior are normal.  ____________________________________________   LABS (all labs ordered are listed, but only abnormal results are displayed)  Labs Reviewed  CBC - Abnormal; Notable for the following components:      Result Value   WBC 11.9 (*)    All other components within normal limits  COMPREHENSIVE METABOLIC PANEL - Abnormal; Notable for the following components:   Glucose, Bld 117 (*)    All other components within normal limits  RESP PANEL BY RT-PCR (FLU A&B, COVID) ARPGX2  LIPASE, BLOOD  TROPONIN I (HIGH SENSITIVITY)  TROPONIN I (HIGH SENSITIVITY)   ____________________________________________  EKG  ED ECG REPORT I, Zakarie Sturdivant J, the attending physician, personally viewed and interpreted this ECG.   Date: 02/09/2021  EKG Time: 0210  Rate: 62  Rhythm: normal sinus rhythm  Axis: Normal  Intervals:none  ST&T  Change: Nonspecific  ____________________________________________  RADIOLOGY I, Sayda Grable J, personally viewed and evaluated these images (plain radiographs) as part of my medical decision making, as well as reviewing the written report by the radiologist.  ED MD interpretation: Shallow lung inflation  Official radiology report(s): DG Chest 2 View  Result Date: 02/09/2021 CLINICAL DATA:  Initial evaluation for acute chest pain. EXAM: CHEST - 2 VIEW COMPARISON:  Prior radiograph from 11/25/2020. FINDINGS: Cardiac and mediastinal silhouettes are stable in size and contour, and remain within normal limits. Lungs are hypoinflated. Scattered streaky and patchy bibasilar opacities, right slightly worse than left, similar to previous, and likely reflecting atelectasis and/or scarring. No frank airspace consolidation. No pulmonary edema or pleural effusion. No pneumothorax. Remotely healed right-sided rib fractures noted. No acute osseous finding. IMPRESSION: 1. Shallow lung inflation with associated bibasilar atelectasis and/or scarring, right slightly worse than left. 2. No other active cardiopulmonary disease. Electronically Signed   By: Jeannine Boga M.D.   On: 02/09/2021 02:44    ____________________________________________   PROCEDURES  Procedure(s) performed (including Critical Care):  .1-3 Lead EKG Interpretation Performed by: Paulette Blanch, MD Authorized by: Paulette Blanch, MD     Interpretation: normal     ECG rate:  63   ECG rate assessment: normal     Rhythm: sinus rhythm     Ectopy: none     Conduction: normal   Comments:     Patient placed on cardiac monitor to evaluate for arrhythmias  CRITICAL CARE Performed by: Paulette Blanch   Total critical care time: 45 minutes  Critical care time was exclusive of separately billable procedures and treating other patients.  Critical care was necessary to treat or prevent imminent or life-threatening deterioration.  Critical  care was time spent personally by me on the following activities: development of treatment plan with patient and/or surrogate as well as nursing, discussions with consultants, evaluation of patient's response to treatment, examination of patient, obtaining history from patient or surrogate, ordering and performing treatments and interventions, ordering and review of laboratory studies, ordering and review of radiographic studies, pulse oximetry and re-evaluation of patient's condition.   ____________________________________________   INITIAL IMPRESSION / ASSESSMENT AND PLAN / ED COURSE  As part of my medical decision making, I reviewed the following data within the [place] History obtained from family, Nursing  notes reviewed and incorporated, Labs reviewed, EKG interpreted, Old chart reviewed, Radiograph reviewed, Discussed with admitting physician, and Notes from prior ED visits     55 year old male with history of CAD presenting with chest pain. Differential diagnosis includes, but is not limited to, ACS, aortic dissection, pulmonary embolism, cardiac tamponade, pneumothorax, pneumonia, pericarditis, myocarditis, GI-related causes including esophagitis/gastritis, and musculoskeletal chest wall pain.     Initial troponin unremarkable.  Patient is hypertensive.  Will administer aspirin, nitroglycerin paste, morphine and Zofran.  Will discuss with hospital services for admission.      ____________________________________________   FINAL CLINICAL IMPRESSION(S) / ED DIAGNOSES  Final diagnoses:  Unstable angina ([place])  Hypertension, unspecified type     ED Discharge Orders     None        Note:  This document was prepared using Dragon voice recognition software and may include unintentional dictation errors.    Paulette Blanch, MD 02/09/21 [phone]

## 2021-02-09 NOTE — Consult Note (Signed)
Cardiology Consultation:   Patient ID: Scott Foley MRN: [id number]; DOB: 08-09-1965  Admit date: 02/09/2021 Date of Consult: 02/09/2021  PCP:  [name], MD   [facility] Providers Cardiologist: New  Patient Profile:   Scott Foley is a 55 y.o. male with a hx of CAD by CT, HTN, COPD, h/o tobacco use, HLD, h/o stroke in 2014 (memory deficits), h/o PE on Eliquis who is being seen 02/09/2021 for the evaluation of chest pain at the request of Dr. Francine Graven.  History of Present Illness:   Scott Foley hs not been seen by cardiology in the past. No prior MI or stent. Family history positive with MI father in 30s, oldest brother died of MI in his 59s. No alcohol, drug, use. Quit smoking 4 months  ago for COPD. Lives with his wife. He does not work, he is on disability due to stroke in 2014.   Recent chest CT showed CAD in LAD and left circumflex, recommended cardiology consult. He had an appointment with [facility] in [place] tomorrow.   The patient presented to the ER 02/09/21 for chest pain. IT started last night around 11PM. Michela Pitcher it was precipitated by abdominal pain. HE was sitting on the couch when it started. Felt something heavy across the chest. Pressures lasted about 10-15 minutes. He vomited twice. Pain improved with ASA. No recent fever, chills, LLE, orthopnea, pnd. Had not felt this pain before. Says pain is worse on exertion.   In the ER BP significantly elevated 208/108, pulse 63, afebrile, 96% O2. Labs showed sodium 138, potassium 3.7, chloride 106, bicarb 27, glucose 117, Scr 1.05, BUN 17, alk phos 62, albumin 4.1, lipase 29, AST 20, ALT 32, WBC 11.9, Hgb 16.2, Hct 46.9, plt count 261.HS trop negative x 2.  Respiratory panel negative. CXR nonacute. EKG showed NSR with nonspecific T wave changes.Patient was admitted for further work-up.     Past Medical History:  Diagnosis Date   Acquired thrombophilia ([place]) 12/06/2020   Chronic obstructive pulmonary disease  ([place]) 07/16/2018   Class 1 obesity due to excess calories with serious comorbidity and body mass index (BMI) of 33.0 to 33.9 in adult 07/28/2019   Colon polyp 10/25/2020   Coronary artery disease due to lipid rich plaque 01/01/2021   Coronary artery disease with stable angina pectoris ([place])    AMI No heart cath, treated medically [facility] [facility]   DNR (do not resuscitate) 03/03/2019   Hyperlipemia    Hypertension    Hypertensive heart disease without congestive heart failure 12/06/2020   Morbid obesity ([place]) 12/06/2020   BMI 35 with serious comorbidities including CAD, HTN, STROKE, Hyperlipidemia.   Need for vaccination 12/15/2019   Pulmonary embolus ([place]) 03/03/2019   Pulmonary nodules/lesions, multiple 11/25/2012   9/15/2014CT Chest : small nodule RUL LUL. Stable since 03/2012 Smoking cessation advised and Rx chantix Repeat CT Chest 4 /21/15: subpleural lymph nodes, likely benign repeat one year 2016>>>no change, considered benign Arlyce Harman 11/25/2012  NORMAL   Sequela, post-stroke 07/28/2019   Tobacco use disorder 11/25/2012    Past Surgical History:  Procedure Laterality Date   COLONOSCOPY WITH PROPOFOL N/A 10/25/2020   Procedure: COLONOSCOPY WITH PROPOFOL;  Surgeon: Jonathon Bellows, MD;  Location: [facility] ENDOSCOPY;  Service: Gastroenterology;  Laterality: N/A;   TONSILLECTOMY       Home Medications:  Prior to Admission medications   Medication Sig Start Date End Date Taking? Authorizing Provider  amLODipine (NORVASC) 5 MG tablet Take 1 tablet (5 mg total) by mouth daily.  12/01/20  Yes [name], [name], MD  apixaban (ELIQUIS) 5 MG TABS tablet Take 1 tablet (5 mg total) by mouth 2 (two) times daily. 12/01/20  Yes [name], [name], MD  clopidogrel (PLAVIX) 75 MG tablet Take 1 tablet (75 mg total) by mouth daily. 12/01/20  Yes [name], [name], MD  lisinopril (ZESTRIL) 40 MG tablet Take 1 tablet (40 mg total) by mouth daily. 12/01/20  Yes [name], [name], MD  metoprolol tartrate (LOPRESSOR) 50 MG tablet Take 1 tablet (50 mg  total) by mouth 2 (two) times daily. 12/01/20  Yes [name], [name], MD  Omega-3 Fatty Acids (FISH OIL) 1000 MG CAPS Take 1 capsule (1,000 mg total) by mouth 2 (two) times daily. 11/30/20  Yes [name], [name], MD  rosuvastatin (CRESTOR) 40 MG tablet Take 1 tablet (40 mg total) by mouth daily. 12/01/20  Yes [name], [name], MD  Evolocumab (REPATHA) 140 MG/ML SOSY Inject 140 mg into the skin every 14 (fourteen) days. 12/30/20   CoxElnita Maxwell, MD  omega-3 acid ethyl esters (LOVAZA) 1 g capsule Take 1 capsule by mouth 2 (two) times daily. Patient not taking: Reported on 02/09/2021 11/30/20   [provider]  varenicline (CHANTIX CONTINUING MONTH PAK) 1 MG tablet Take 1 tablet (1 mg total) by mouth 2 (two) times daily. Patient not taking: Reported on 02/09/2021 01/01/21   CoxElnita Maxwell, MD    Inpatient Medications: Scheduled Meds:  amLODipine  5 mg Oral Daily   apixaban  5 mg Oral BID   clopidogrel  75 mg Oral Daily   lisinopril  40 mg Oral Daily   metoprolol tartrate  50 mg Oral BID   omega-3 acid ethyl esters  1 capsule Oral BID   rosuvastatin  40 mg Oral QHS   varenicline  1 mg Oral BID   Continuous Infusions:  PRN Meds: acetaminophen, ondansetron (ZOFRAN) IV  Allergies:    Allergies  Allergen Reactions   Penicillin G Hives    Social History:   Social History   Socioeconomic History   Marital status: Married    Spouse name: Not on file   Number of children: Not on file   Years of education: Not on file   Highest education level: Not on file  Occupational History   Not on file  Tobacco Use   Smoking status: Former    Packs/day: 0.75    Years: 30.00    Pack years: 22.50    Types: Cigarettes    Start date: 03/14/1979    Quit date: 11/2020    Years since quitting: 0.2   Smokeless tobacco: Never   Tobacco comments:    Will start chantix.   Vaping Use   Vaping Use: Every day   Start date: 07/28/2018   Devices: 6 mg daily.  Substance and Sexual Activity   Alcohol use: No    Drug use: No   Sexual activity: Not on file  Other Topics Concern   Not on file  Social History Narrative   Not on file   Social Determinants of Health   Financial Resource Strain: Not on file  Food Insecurity: Not on file  Transportation Needs: Not on file  Physical Activity: Not on file  Stress: Not on file  Social Connections: Not on file  Intimate Partner Violence: Not on file    Family History:    Family History  Problem Relation Age of Onset   Hypertension Father    Heart attack Father    Stroke Father    Prostate cancer  Father    Hypertension Sister    Hypertension Brother    Heart attack Brother    Lung cancer Sister      ROS:  Please see the history of present illness.   All other ROS reviewed and negative.     Physical Exam/Data:   Vitals:   02/09/21 0430 02/09/21 0500 02/09/21 0530 02/09/21 0730  BP: (!) 157/114 (!) 164/103 (!) 143/85 132/90  Pulse: (!) 57 63 66 72  Resp: 15 15 (!) 21 16  Temp:      TempSrc:      SpO2: 95% 93% 92% 95%  Weight:      Height:        Intake/Output Summary (Last 24 hours) at 02/09/2021 0859 Last data filed at 02/09/2021 0542 Gross per 24 hour  Intake 50 ml  Output --  Net 50 ml   Last 3 Weights 02/09/2021 12/30/2020 11/25/2020  Weight (lbs) 218 lb 209 lb 204 lb 3.2 oz  Weight (kg) 98.884 kg 94.802 kg 92.625 kg     Body mass index is 37.42 kg/m.  General:  Well nourished, well developed, in no acute distress HEENT: normal Neck: no JVD Vascular: No carotid bruits; Distal pulses 2+ bilaterally Cardiac:  normal S1, S2; RRR; no murmur  Lungs:  clear to auscultation bilaterally, no wheezing, rhonchi or rales  Abd: soft, nontender, no hepatomegaly  Ext: no edema Musculoskeletal:  No deformities, BUE and BLE strength normal and equal Skin: warm and dry  Neuro:  CNs 2-12 intact, no focal abnormalities noted Psych:  Normal affect   EKG:  The EKG was personally reviewed and demonstrates:  NSR, TW changes III, q  waves V3 Telemetry:  Telemetry was personally reviewed and demonstrates:  NSR, HR 70s, PVCs  Relevant CV Studies:  Echo   Echo 02/2019  1. Left ventricular ejection fraction, by visual estimation, is 55 to  60%. The left ventricle has normal function. There is no left ventricular  hypertrophy.   2. The left ventricle has no regional wall motion abnormalities.   3. Global right ventricle has normal systolic function.The right  ventricular size is normal. No increase in right ventricular wall  thickness.   4. Left atrial size was normal.   5. Right atrial size was normal.   6. The mitral valve is normal in structure. No evidence of mitral valve  regurgitation. No evidence of mitral stenosis.   7. The tricuspid valve is normal in structure. Tricuspid valve  regurgitation is trivial.   8. The aortic valve is normal in structure. Aortic valve regurgitation is  not visualized. No evidence of aortic valve sclerosis or stenosis.   9. The pulmonic valve was normal in structure. Pulmonic valve  regurgitation is not visualized.  10. Normal pulmonary artery systolic pressure.   Laboratory Data:  High Sensitivity Troponin:   Recent Labs  Lab 02/09/21 0215 02/09/21 0440  TROPONINIHS 4 5     Chemistry Recent Labs  Lab 02/09/21 0215  NA 138  K 3.7  CL 106  CO2 27  GLUCOSE 117*  BUN 17  CREATININE 1.05  CALCIUM 9.5  GFRNONAA >60  ANIONGAP 5    Recent Labs  Lab 02/09/21 0215  PROT 7.3  ALBUMIN 4.1  AST 20  ALT 32  ALKPHOS 61  BILITOT 0.7   Lipids No results for input(s): CHOL, TRIG, HDL, LABVLDL, LDLCALC, CHOLHDL in the last 168 hours.  Hematology Recent Labs  Lab 02/09/21 0215  WBC 11.9*  RBC  5.33  HGB 16.2  HCT 46.9  MCV 88.0  MCH 30.4  MCHC 34.5  RDW 12.8  PLT 261   Thyroid No results for input(s): TSH, FREET4 in the last 168 hours.  BNPNo results for input(s): BNP, PROBNP in the last 168 hours.  DDimer No results for input(s): DDIMER in the last 168  hours.   Radiology/Studies:  DG Chest 2 View  Result Date: 02/09/2021 CLINICAL DATA:  Initial evaluation for acute chest pain. EXAM: CHEST - 2 VIEW COMPARISON:  Prior radiograph from 11/25/2020. FINDINGS: Cardiac and mediastinal silhouettes are stable in size and contour, and remain within normal limits. Lungs are hypoinflated. Scattered streaky and patchy bibasilar opacities, right slightly worse than left, similar to previous, and likely reflecting atelectasis and/or scarring. No frank airspace consolidation. No pulmonary edema or pleural effusion. No pneumothorax. Remotely healed right-sided rib fractures noted. No acute osseous finding. IMPRESSION: 1. Shallow lung inflation with associated bibasilar atelectasis and/or scarring, right slightly worse than left. 2. No other active cardiopulmonary disease. Electronically Signed   By: Jeannine Boga M.D.   On: 02/09/2021 02:44     Assessment and Plan:   Chest pain CAD by chest CT - presents with chest pain with typical and atypical features, found to have normal troponin x2. EKG with nonspecific changes. WBC mildly elevated - PTA plavix metoprolol, Crestor, Lovaza - Was also on Eliquis for h/o PE - Recent chest CT showed coronary artery calcifications and he was referred to cardiology - Echo ordered - No indication for IV heparin at this time - given negative troponin plan for Myoview stress test today.  - does not likely need both Plavix and eliquis.  - need to work on risk factors  HTN - significantly elevated on admission - PTA amlodipine 67m daily, lisinopril 437mdaily, Lopressor 5070mID - BP improved - continue current medications  H/p PE/DVTs x 2 - continue Eliquis  Hyperlipidemia - PTA crestor 13m39mily and Lovaza - LDL 178 - PCP recently ordered Repatha  H/o stroke - PTA plavix  COPD H/o tobacco use - quit smoking 4 months ago  For questions or updates, please contact [facility] consult  [email or web address] for contact info under    Signed, Andrey Hoobler H FuNinfa Meeker-C  02/09/2021 8:59 AM

## 2021-02-10 ENCOUNTER — Ambulatory Visit: Payer: Medicare HMO | Admitting: Cardiology

## 2021-02-10 DIAGNOSIS — R072 Precordial pain: Secondary | ICD-10-CM | POA: Diagnosis not present

## 2021-02-10 HISTORY — PX: LAPAROSCOPIC CHOLECYSTECTOMY: SUR755

## 2021-02-10 MED ORDER — ISOSORBIDE MONONITRATE ER 30 MG PO TB24: 30.0000 mg | ORAL_TABLET | Freq: Every day | ORAL | 1 refills | Status: DC

## 2021-02-10 NOTE — Discharge Instructions (Addendum)
Patient recommended to keep log of his blood pressure at home. If systolic blood pressure is less than 110 hold BP meds that time of the day and recheck before taking cardiac meds. If any questions call [facility] cardiology in [place]. Follow-up with your primary care physician in 1 to 2 week

## 2021-02-10 NOTE — Discharge Summary (Signed)
Scott Foley NAME: Rodricus Candelaria    MR#:  [id number]  DATE OF BIRTH:  23-Jul-1965  DATE OF ADMISSION:  02/09/2021 ADMITTING PHYSICIAN: Athena Masse, MD  DATE OF DISCHARGE: 02/10/21  PRIMARY CARE PHYSICIAN: CoxElnita Maxwell, MD    ADMISSION DIAGNOSIS:  Unstable angina ([place]) [I20.0] Chest pain [R07.9] Hypertension, unspecified type [I10]  DISCHARGE DIAGNOSIS:  Chest pain ruled out MI  SECONDARY DIAGNOSIS:   Past Medical History:  Diagnosis Date   Acquired thrombophilia ([place]) 12/06/2020   Chronic obstructive pulmonary disease ([place]) 07/16/2018   Class 1 obesity due to excess calories with serious comorbidity and body mass index (BMI) of 33.0 to 33.9 in adult 07/28/2019   Colon polyp 10/25/2020   Coronary artery disease due to lipid rich plaque 01/01/2021   Coronary artery disease with stable angina pectoris ([place])    AMI No heart cath, treated medically [facility]   DNR (do not resuscitate) 03/03/2019   Hyperlipemia    Hypertension    Hypertensive heart disease without congestive heart failure 12/06/2020   Morbid obesity ([place]) 12/06/2020   BMI 35 with serious comorbidities including CAD, HTN, STROKE, Hyperlipidemia.   Need for vaccination 12/15/2019   Pulmonary embolus ([place]) 03/03/2019   Pulmonary nodules/lesions, multiple 11/25/2012   9/15/2014CT Chest : small nodule RUL LUL. Stable since 03/2012 Smoking cessation advised and Rx chantix Repeat CT Chest 4 /21/15: subpleural lymph nodes, likely benign repeat one year 2016>>>no change, considered benign Arlyce Harman 11/25/2012  NORMAL   Sequela, post-stroke 07/28/2019   Tobacco use disorder 11/25/2012    HOSPITAL COURSE:  Scott Foley is a 55 y.o. male with medical history significant for coronary artery disease, hypertension who presents to the emergency room for evaluation of chest pain. Patient states symptoms started at around 11 PM the night prior to his admission and the pain was  mostly over the left anterior chest wall.  He rated his pain a 6/10 in intensity at its worst and it was non-radiating  chest pain/unstable angina resolved -- cardiac markers remain negative -- no acute EKG changes -- seen by [facility] cardiology okay for discharge -- Lexiscan/stress test as outpatient was negative --Imdur was added by cardiology -- on  Plavix  Hypertension -- continue metoprolol lisinopril with holding parameters -- blood pressure soft I have discontinued amlodipine. Can be resumed as outpatient if needed  history of PE -- continue eliquis  Hyperlipidemia -- continue repatha, Crestor, fish oil  overall hemodynamically stable. Will discharged to home. All questions answered. Patient is in agreement with plan  CONSULTS OBTAINED:  Treatment Team:  Kate Sable, MD  DRUG ALLERGIES:   Allergies  Allergen Reactions   Penicillin G Hives    DISCHARGE MEDICATIONS:   Allergies as of 02/10/2021       Reactions   Penicillin G Hives        Medication List     STOP taking these medications    amLODipine 5 MG tablet Commonly known as: NORVASC   omega-3 acid ethyl esters 1 g capsule Commonly known as: LOVAZA   varenicline 1 MG tablet Commonly known as: Chantix Continuing Month Pak       TAKE these medications    apixaban 5 MG Tabs tablet Commonly known as: Eliquis Take 1 tablet (5 mg total) by mouth 2 (two) times daily.   clopidogrel 75 MG tablet Commonly known as: PLAVIX Take 1 tablet (75 mg total) by mouth daily.  Fish Oil 1000 MG Caps Take 1 capsule (1,000 mg total) by mouth 2 (two) times daily.   isosorbide mononitrate 30 MG 24 hr tablet Commonly known as: IMDUR Take 1 tablet (30 mg total) by mouth daily. Start taking on: February 11, 2021   lisinopril 40 MG tablet Commonly known as: ZESTRIL Take 1 tablet (40 mg total) by mouth daily.   metoprolol tartrate 50 MG tablet Commonly known as: LOPRESSOR Take 1 tablet (50 mg total)  by mouth 2 (two) times daily.   Repatha 140 MG/ML [place] Generic drug: Evolocumab Inject 140 mg into the skin every 14 (fourteen) days.   rosuvastatin 40 MG tablet Commonly known as: Crestor Take 1 tablet (40 mg total) by mouth daily.        If you experience worsening of your admission symptoms, develop shortness of breath, life threatening emergency, suicidal or homicidal thoughts you must seek medical attention immediately by calling 911 or calling your MD immediately  if symptoms less severe.  You Must read complete instructions/literature along with all the possible adverse reactions/side effects for all the Medicines you take and that have been prescribed to you. Take any new Medicines after you have completely understood and accept all the possible adverse reactions/side effects.   Please note  You were cared for by a hospitalist during your hospital stay. If you have any questions about your discharge medications or the care you received while you were in the hospital after you are discharged, you can call the unit and asked to speak with the hospitalist on call if the hospitalist that took care of you is not available. Once you are discharged, your primary care physician will handle any further medical issues. Please note that NO REFILLS for any discharge medications will be authorized once you are discharged, as it is imperative that you return to your primary care physician (or establish a relationship with a primary care physician if you do not have one) for your aftercare needs so that they can reassess your need for medications and monitor your lab values. Today   SUBJECTIVE   no chest pain. No shortness of breath. VITAL SIGNS:  Blood pressure 99/69, pulse 68, temperature 98.1 F (36.7 C), resp. rate 18, height 5\' 4"  (1.626 m), weight 98.9 kg, SpO2 96 %.  I/O:   Intake/Output Summary (Last 24 hours) at 02/10/2021 0921 Last data filed at 02/10/2021 0836 Gross per 24 hour   Intake 240 ml  Output --  Net 240 ml    PHYSICAL EXAMINATION:  GENERAL:  55 y.o.-year-old patient lying in the bed with no acute distress.  LUNGS: Normal breath sounds bilaterally, no wheezing, rales,rhonchi or crepitation. No use of accessory muscles of respiration.  CARDIOVASCULAR: S1, S2 normal. No murmurs, rubs, or gallops.  ABDOMEN: Soft, non-tender, non-distended. Bowel sounds present. No organomegaly or mass.  EXTREMITIES: No pedal edema, cyanosis, or clubbing.  NEUROLOGIC: non-focal PSYCHIATRIC:  patient is alert and oriented x 3.  SKIN: No obvious rash, lesion, or ulcer.   DATA REVIEW:   CBC  Recent Labs  Lab 02/09/21 0215  WBC 11.9*  HGB 16.2  HCT 46.9  PLT 261    Chemistries  Recent Labs  Lab 02/09/21 0215  NA 138  K 3.7  CL 106  CO2 27  GLUCOSE 117*  BUN 17  CREATININE 1.05  CALCIUM 9.5  AST 20  ALT 32  ALKPHOS 61  BILITOT 0.7    Microbiology Results   Recent Results (from the  past 240 hour(s))  Resp Panel by RT-PCR (Flu A&B, Covid) Nasopharyngeal Swab     Status: None   Collection Time: 02/09/21  4:40 AM   Specimen: Nasopharyngeal Swab; Nasopharyngeal(NP) swabs in vial transport medium  Result Value Ref Range Status   SARS Coronavirus 2 by RT PCR NEGATIVE NEGATIVE Final    Comment: (NOTE) SARS-CoV-2 target nucleic acids are NOT DETECTED.  The SARS-CoV-2 RNA is generally detectable in upper respiratory specimens during the acute phase of infection. The lowest concentration of SARS-CoV-2 viral copies this assay can detect is 138 copies/mL. A negative result does not preclude SARS-Cov-2 infection and should not be used as the sole basis for treatment or other patient management decisions. A negative result may occur with  improper specimen collection/handling, submission of specimen other than nasopharyngeal swab, presence of viral mutation(s) within the areas targeted by this assay, and inadequate number of viral copies(<138 copies/mL). A  negative result must be combined with clinical observations, patient history, and epidemiological information. The expected result is Negative.  Fact Sheet for Patients:  EntrepreneurPulse.com.au  Fact Sheet for Healthcare Providers:  IncredibleEmployment.be  This test is no t yet approved or cleared by the [place] FDA and  has been authorized for detection and/or diagnosis of SARS-CoV-2 by FDA under an Emergency Use Authorization (EUA). This EUA will remain  in effect (meaning this test can be used) for the duration of the COVID-19 declaration under Section 564(b)(1) of the Act, 21 U.S.C.section 360bbb-3(b)(1), unless the authorization is terminated  or revoked sooner.       Influenza A by PCR NEGATIVE NEGATIVE Final   Influenza B by PCR NEGATIVE NEGATIVE Final    Comment: (NOTE) The Xpert Xpress SARS-CoV-2/FLU/RSV plus assay is intended as an aid in the diagnosis of influenza from Nasopharyngeal swab specimens and should not be used as a sole basis for treatment. Nasal washings and aspirates are unacceptable for Xpert Xpress SARS-CoV-2/FLU/RSV testing.  Fact Sheet for Patients: EntrepreneurPulse.com.au  Fact Sheet for Healthcare Providers: IncredibleEmployment.be  This test is not yet approved or cleared by the [place] FDA and has been authorized for detection and/or diagnosis of SARS-CoV-2 by FDA under an Emergency Use Authorization (EUA). This EUA will remain in effect (meaning this test can be used) for the duration of the COVID-19 declaration under Section 564(b)(1) of the Act, 21 U.S.C. section 360bbb-3(b)(1), unless the authorization is terminated or revoked.  Performed at [facility], [address] [place], [place] [address]     RADIOLOGY:  DG Chest 2 View  Result Date: 02/09/2021 CLINICAL DATA:  Initial evaluation for acute chest pain. EXAM: CHEST - 2 VIEW  COMPARISON:  Prior radiograph from 11/25/2020. FINDINGS: Cardiac and mediastinal silhouettes are stable in size and contour, and remain within normal limits. Lungs are hypoinflated. Scattered streaky and patchy bibasilar opacities, right slightly worse than left, similar to previous, and likely reflecting atelectasis and/or scarring. No frank airspace consolidation. No pulmonary edema or pleural effusion. No pneumothorax. Remotely healed right-sided rib fractures noted. No acute osseous finding. IMPRESSION: 1. Shallow lung inflation with associated bibasilar atelectasis and/or scarring, right slightly worse than left. 2. No other active cardiopulmonary disease. Electronically Signed   By: Jeannine Boga M.D.   On: 02/09/2021 02:44   NM Myocar Multi W/Spect Tamela Oddi Motion / EF  Result Date: 02/09/2021   The study is low risk.   No ST deviation was noted.   Left ventricular function is normal. LVEF hyperdynamic (>75)   There  is no evidence for ischemia   There is no significant coronary artery calcification   ECHOCARDIOGRAM COMPLETE  Result Date: 02/09/2021    ECHOCARDIOGRAM REPORT   Patient Name:   Scott Foley Date of Exam: 02/09/2021 Medical Rec #:  [id number]             Height:       64.0 in Accession #:    [id number]            Weight:       218.0 lb Date of Birth:  [birth year]             BSA:          2.029 m Patient Age:    66 years              BP:           112/88 mmHg Patient Gender: M                     HR:           73 bpm. Exam Location:  [facility] Procedure: 2D Echo, Color Doppler, Cardiac Doppler and Intracardiac            Opacification Agent Indications:     R07.9 Chest Pain  History:         Patient has prior history of Echocardiogram examinations. CAD,                  COPD; Risk Factors:Family History of Coronary Artery Disease,                  Hypertension, Dyslipidemia and Former Smoker.  Sonographer:     Charmayne Sheer Referring Phys:  [id number] AGBATA Diagnosing Phys:  Kate Sable MD  Sonographer Comments: Suboptimal parasternal window and no apical window. Image acquisition challenging due to COPD. IMPRESSIONS  1. Left ventricular ejection fraction, by estimation, is 60 to 65%. The left ventricle has normal function. The left ventricle has no regional wall motion abnormalities. Left ventricular diastolic parameters are consistent with Grade II diastolic dysfunction (pseudonormalization).  2. Right ventricular systolic function is normal. The right ventricular size is normal.  3. The mitral valve is normal in structure. No evidence of mitral valve regurgitation.  4. The aortic valve is tricuspid. Aortic valve regurgitation is not visualized.  5. The inferior vena cava is normal in size with greater than 50% respiratory variability, suggesting right atrial pressure of 3 mmHg. FINDINGS  Left Ventricle: Left ventricular ejection fraction, by estimation, is 60 to 65%. The left ventricle has normal function. The left ventricle has no regional wall motion abnormalities. Definity contrast agent was given IV to delineate the left ventricular  endocardial borders. The left ventricular internal cavity size was normal in size. There is no left ventricular hypertrophy. Left ventricular diastolic parameters are consistent with Grade II diastolic dysfunction (pseudonormalization). Right Ventricle: The right ventricular size is normal. No increase in right ventricular wall thickness. Right ventricular systolic function is normal. Left Atrium: Left atrial size was normal in size. Right Atrium: Right atrial size was normal in size. Pericardium: There is no evidence of pericardial effusion. Mitral Valve: The mitral valve is normal in structure. No evidence of mitral valve regurgitation. MV peak gradient, 5.3 mmHg. The mean mitral valve gradient is 2.0 mmHg. Tricuspid Valve: The tricuspid valve is normal in structure. Tricuspid valve regurgitation is not demonstrated. Aortic Valve: The aortic  valve is tricuspid. Aortic  valve regurgitation is not visualized. Aortic valve mean gradient measures 5.0 mmHg. Aortic valve peak gradient measures 8.9 mmHg. Aortic valve area, by VTI measures 2.01 cm. Pulmonic Valve: The pulmonic valve was not well visualized. Pulmonic valve regurgitation is not visualized. Aorta: The aortic root is normal in size and structure. Venous: The inferior vena cava is normal in size with greater than 50% respiratory variability, suggesting right atrial pressure of 3 mmHg. IAS/Shunts: No atrial level shunt detected by color flow Doppler.  LEFT VENTRICLE PLAX 2D LVIDd:         3.98 cm     Diastology LVIDs:         3.37 cm     LV e' medial:    5.87 cm/s LV PW:         0.82 cm     LV E/e' medial:  15.1 LV IVS:        0.71 cm     LV e' lateral:   6.85 cm/s LVOT diam:     1.70 cm     LV E/e' lateral: 12.9 LV SV:         47 LV SV Index:   23 LVOT Area:     2.27 cm  LV Volumes (MOD) LV vol d, MOD A2C: 40.5 ml LV vol d, MOD A4C: 70.4 ml LV vol s, MOD A2C: 10.1 ml LV vol s, MOD A4C: 16.5 ml LV SV MOD A2C:     30.4 ml LV SV MOD A4C:     70.4 ml LV SV MOD BP:      41.1 ml RIGHT VENTRICLE RV Basal diam:  3.77 cm LEFT ATRIUM             Index        RIGHT ATRIUM           Index LA diam:        3.10 cm 1.53 cm/m   RA Area:     15.90 cm LA Vol (A2C):   19.6 ml 9.66 ml/m   RA Volume:   35.60 ml  17.54 ml/m LA Vol (A4C):   25.6 ml 12.61 ml/m LA Biplane Vol: 23.0 ml 11.33 ml/m  AORTIC VALVE                     PULMONIC VALVE AV Area (Vmax):    1.74 cm      PV Vmax:       1.13 m/s AV Area (Vmean):   1.70 cm      PV Vmean:      73.200 cm/s AV Area (VTI):     2.01 cm      PV VTI:        0.197 m AV Vmax:           149.00 cm/s   PV Peak grad:  5.1 mmHg AV Vmean:          109.000 cm/s  PV Mean grad:  2.0 mmHg AV VTI:            0.232 m AV Peak Grad:      8.9 mmHg AV Mean Grad:      5.0 mmHg LVOT Vmax:         114.00 cm/s LVOT Vmean:        81.700 cm/s LVOT VTI:          0.205 m LVOT/AV VTI ratio: 0.88   AORTA Ao Root diam: 3.10 cm MITRAL VALVE MV Area (  PHT): 2.72 cm     SHUNTS MV Area VTI:   1.80 cm     Systemic VTI:  0.20 m MV Peak grad:  5.3 mmHg     Systemic Diam: 1.70 cm MV Mean grad:  2.0 mmHg MV Vmax:       1.15 m/s MV Vmean:      68.8 cm/s MV Decel Time: 279 msec MV E velocity: 88.70 cm/s MV A velocity: 101.00 cm/s MV E/A ratio:  0.88 Kate Sable MD Electronically signed by Kate Sable MD Signature Date/Time: 02/09/2021/3:44:52 PM    Final      CODE STATUS:     Code Status Orders  (From admission, onward)           Start     Ordered   02/09/21 0828  Full code  Continuous        02/09/21 0828           Code Status History     Date Active Date Inactive Code Status Order ID Comments User Context   03/03/2019 0341 03/07/2019 1618 DNR [id number]  Christel Mormon, MD ED   03/03/2019 0218 03/03/2019 0341 Full Code [id number]  Mansy, Arvella Merles, MD ED        TOTAL TIME TAKING CARE OF THIS PATIENT: 40 minutes.    Fritzi Mandes M.D  [facility]    CC: Primary care physician; [name], MD

## 2021-02-14 NOTE — Progress Notes (Signed)
Subjective:  Patient ID: Scott Foley, male    DOB: 05-31-1965  Age: 55 y.o. MRN: [id number]  Chief Complaint  Patient presents with   Hospitalization Follow-up    HPI  Scott Foley is a 55 year old Caucasian male that presents for hospital follow-up. He is accompanied by his spouse. He was admitted to [facility] on 02/09/21 for angina and elevated BP.Antihypertensive medications were adjusted. Norvasc was discontinued and Imdur was prescribed. Echo performed in hospital revealed normal RF 60-65% with grade II diastolic dysfunction. Admits to cardiac history with CAD with acute [place] ([place]), aortic atherosclerosis, HTN, and stable angina. He has a history of CVA in 2014, bilateral PE in 2020. He is currently taking Eliquis and Plavix. He has been seen by hematology in the past. Denies known genetic blood clotting disorders. States his mother died from complications of a blood clot. Scott Foley recently quit smoking. History of emphysema per EMR.  His spouse tells me that Scott Foley has experienced intermittent belching, bloating, and mid-epigastric abdominal pain recently. States he had two episodes of vomiting the night he was hospitalized. Denies known intolerance to high fat food or history of GERD.Scott Foley underwent screening colonoscopy on 10/25/20. 25 mm polyp removed from proximal ascending colon, diverticulosis noted per Dr. Isidore Moos.     Follow up Hospitalization  Patient was admitted to [facility] on 02/09/2021 and discharged on 02/10/2021. He was treated for Angina. He reports excellent compliance with treatment. He reports this condition is improved.  Current Outpatient Medications on File Prior to Visit  Medication Sig Dispense Refill   apixaban (ELIQUIS) 5 MG TABS tablet Take 1 tablet (5 mg total) by mouth 2 (two) times daily. 90 tablet 0   clopidogrel (PLAVIX) 75 MG tablet Take 1 tablet (75 mg total) by mouth daily. 90 tablet 0   Evolocumab (REPATHA) 140 MG/ML  SOSY Inject 140 mg into the skin every 14 (fourteen) days. 6 mL 0   isosorbide mononitrate (IMDUR) 30 MG 24 hr tablet Take 1 tablet (30 mg total) by mouth daily. 30 tablet 1   lisinopril (ZESTRIL) 40 MG tablet Take 1 tablet (40 mg total) by mouth daily. 90 tablet 0   metoprolol tartrate (LOPRESSOR) 50 MG tablet Take 1 tablet (50 mg total) by mouth 2 (two) times daily. 180 tablet 0   Omega-3 Fatty Acids (FISH OIL) 1000 MG CAPS Take 1 capsule (1,000 mg total) by mouth 2 (two) times daily. 180 capsule 0   rosuvastatin (CRESTOR) 40 MG tablet Take 1 tablet (40 mg total) by mouth daily. 90 tablet 0   No current facility-administered medications on file prior to visit.   Past Medical History:  Diagnosis Date   Acquired thrombophilia ([place]) 12/06/2020   Chronic obstructive pulmonary disease ([place]) 07/16/2018   Class 1 obesity due to excess calories with serious comorbidity and body mass index (BMI) of 33.0 to 33.9 in adult 07/28/2019   Colon polyp 10/25/2020   Coronary artery disease due to lipid rich plaque 01/01/2021   Coronary artery disease with stable angina pectoris ([place])    AMI No heart cath, treated medically [place]   DNR (do not resuscitate) 03/03/2019   Hyperlipemia    Hypertension    Hypertensive heart disease without congestive heart failure 12/06/2020   Morbid obesity ([place]) 12/06/2020   BMI 35 with serious comorbidities including CAD, HTN, STROKE, Hyperlipidemia.   Need for vaccination 12/15/2019   Pulmonary embolus ([place]) 03/03/2019   Pulmonary nodules/lesions, multiple 11/25/2012   9/15/2014CT Chest :  small nodule RUL LUL. Stable since 03/2012 Smoking cessation advised and Rx chantix Repeat CT Chest 4 /21/15: subpleural lymph nodes, likely benign repeat one year 2016>>>no change, considered benign Arlyce Harman 11/25/2012  NORMAL   [place], post-stroke 07/28/2019   Tobacco use disorder 11/25/2012   Past Surgical History:  Procedure Laterality Date   COLONOSCOPY WITH PROPOFOL N/A 10/25/2020    Procedure: COLONOSCOPY WITH PROPOFOL;  Surgeon: Jonathon Bellows, MD;  Location: [facility] ENDOSCOPY;  Service: Gastroenterology;  Laterality: N/A;   TONSILLECTOMY      Family History  Problem Relation Age of Onset   Hypertension Father    Heart attack Father    Stroke Father    Prostate cancer Father    Hypertension Sister    Hypertension Brother    Heart attack Brother    Lung cancer Sister    Social History   Socioeconomic History   Marital status: Married    Spouse name: Not on file   Number of children: Not on file   Years of education: Not on file   Highest education level: Not on file  Occupational History   Not on file  Tobacco Use   Smoking status: Former    Packs/day: 0.75    Years: 30.00    Pack years: 22.50    Types: Cigarettes    Start date: 03/14/1979    Quit date: 11/2020    Years since quitting: 0.2   Smokeless tobacco: Never   Tobacco comments:    Will start chantix.   Vaping Use   Vaping Use: Every day   Start date: 07/28/2018   Devices: 6 mg daily.  Substance and Sexual Activity   Alcohol use: No   Drug use: No   Sexual activity: Not on file  Other Topics Concern   Not on file  Social History Narrative   Not on file   Social Determinants of Health   Financial Resource Strain: Not on file  Food Insecurity: Not on file  Transportation Needs: Not on file  Physical Activity: Not on file  Stress: Not on file  Social Connections: Not on file    Review of Systems  Constitutional:  Negative for chills, diaphoresis, fatigue and fever.  HENT:  Negative for congestion, ear pain and sore throat.   Respiratory:  Negative for cough and shortness of breath.   Cardiovascular:  Negative for chest pain and leg swelling.  Gastrointestinal:  Positive for abdominal pain and nausea. Negative for constipation, diarrhea and vomiting.       Belching and bloating  Genitourinary:  Negative for dysuria and urgency.  Musculoskeletal:  Negative for arthralgias and myalgias.   Neurological:  Negative for dizziness and headaches.  Psychiatric/Behavioral:  Negative for dysphoric mood.     Objective:  BP 132/78   Pulse (!) 53   Temp (!) 97.5 F (36.4 C)   Wt 212 lb (96.2 kg)   SpO2 99%   BMI 36.39 kg/m    BP/Weight 02/10/2021 02/09/2021 73/71/0626  Systolic BP 948 - 546  Diastolic BP 65 - 78  Wt. (Lbs) - 218 209  BMI - 37.42 35.87    Physical Exam Vitals reviewed.  Constitutional:      Appearance: Normal appearance.  HENT:     Mouth/Throat:     Mouth: Mucous membranes are dry.  Cardiovascular:     Rate and Rhythm: Regular rhythm. Bradycardia present.     Pulses: Normal pulses.     Heart sounds: Normal heart sounds.  Pulmonary:  Effort: Pulmonary effort is normal.     Breath sounds: Normal breath sounds.  Abdominal:     General: Bowel sounds are normal.     Palpations: Abdomen is soft.     Tenderness: There is abdominal tenderness (RUQ with deep palpation). Positive signs include Murphy's sign.  Musculoskeletal:     Cervical back: Neck supple.  Skin:    General: Skin is warm and dry.     Capillary Refill: Capillary refill takes less than 2 seconds.  Neurological:     General: No focal deficit present.     Mental Status: He is alert and oriented to person, place, and time.  Psychiatric:        Mood and Affect: Mood normal.        Behavior: Behavior normal.    Diabetic Foot Exam - Simple   No data filed      Lab Results  Component Value Date   WBC 11.9 (H) 02/09/2021   HGB 16.2 02/09/2021   HCT 46.9 02/09/2021   PLT 261 02/09/2021   GLUCOSE 117 (H) 02/09/2021   CHOL 252 (H) 11/25/2020   TRIG 240 (H) 11/25/2020   HDL 28 (L) 11/25/2020   LDLCALC 178 (H) 11/25/2020   ALT 32 02/09/2021   AST 20 02/09/2021   NA 138 02/09/2021   K 3.7 02/09/2021   CL 106 02/09/2021   CREATININE 1.05 02/09/2021   BUN 17 02/09/2021   CO2 27 02/09/2021   TSH 1.870 11/25/2020   INR 2.0 (H) 04/26/2019      Assessment & Plan:   1.  Coronary artery disease of native artery of native heart with stable angina pectoris (HCC)-stable -Continue Imdur 30 mg  2. Epigastric pain - US Abdomen Limited RUQ (LIVER/GB); Future  3. Flatulence/gas pain/belching - US Abdomen Limited RUQ (LIVER/GB); Future  4. Gastroesophageal reflux disease without esophagitis - pantoprazole (PROTONIX) 40 MG tablet; Take 1 tablet (40 mg total) by mouth daily.  Dispense: 90 tablet; Refill: 3 - US Abdomen Limited RUQ (LIVER/GB); Future  5. Anticoagulant long-term use  -Fall precautions -Continue Plavix and Eliquis as prescribed  Begin Protonix 40 mg for GERD Follow-gallbladder eating plan We will call you with appointment for abdominal ultrasound    Follow-up: 4-weeks  An After Visit Summary was printed and given to the patient.  Signed, Rip Harbour, NP [place] [phone]

## 2021-02-15 ENCOUNTER — Other Ambulatory Visit: Payer: Self-pay

## 2021-02-15 ENCOUNTER — Encounter: Payer: Self-pay | Admitting: Cardiology

## 2021-02-15 ENCOUNTER — Ambulatory Visit: Payer: Medicare HMO | Admitting: Cardiology

## 2021-02-15 ENCOUNTER — Ambulatory Visit (INDEPENDENT_AMBULATORY_CARE_PROVIDER_SITE_OTHER): Payer: Medicare HMO | Admitting: Nurse Practitioner

## 2021-02-15 VITALS — BP 152/90 | HR 52 | Ht 64.0 in | Wt 213.2 lb

## 2021-02-15 VITALS — BP 132/78 | HR 53 | Temp 97.5°F | Wt 212.0 lb

## 2021-02-15 DIAGNOSIS — Z86711 Personal history of pulmonary embolism: Secondary | ICD-10-CM

## 2021-02-15 DIAGNOSIS — E782 Mixed hyperlipidemia: Secondary | ICD-10-CM | POA: Diagnosis not present

## 2021-02-15 DIAGNOSIS — R1013 Epigastric pain: Secondary | ICD-10-CM

## 2021-02-15 DIAGNOSIS — R140 Abdominal distension (gaseous): Secondary | ICD-10-CM

## 2021-02-15 DIAGNOSIS — I25118 Atherosclerotic heart disease of native coronary artery with other forms of angina pectoris: Secondary | ICD-10-CM

## 2021-02-15 DIAGNOSIS — K219 Gastro-esophageal reflux disease without esophagitis: Secondary | ICD-10-CM

## 2021-02-15 DIAGNOSIS — I10 Essential (primary) hypertension: Secondary | ICD-10-CM | POA: Insufficient documentation

## 2021-02-15 DIAGNOSIS — Z7901 Long term (current) use of anticoagulants: Secondary | ICD-10-CM | POA: Diagnosis not present

## 2021-02-15 HISTORY — DX: Personal history of pulmonary embolism: Z86.711

## 2021-02-15 HISTORY — DX: Essential (primary) hypertension: I10

## 2021-02-15 MED ORDER — PANTOPRAZOLE SODIUM 40 MG PO TBEC: 40.0000 mg | DELAYED_RELEASE_TABLET | Freq: Every day | ORAL | 3 refills | Status: DC

## 2021-02-15 NOTE — Patient Instructions (Signed)

## 2021-02-15 NOTE — Patient Instructions (Signed)
Begin Protonix 40 mg for GERD Follow-gallbladder eating plan We will call you with appointment for abdominal ultrasound   Gallbladder Eating Plan If you have a gallbladder condition, you may have trouble digesting fats. Eating a low-fat diet can help reduce your symptoms, and may be helpful before and after having surgery to remove your gallbladder (cholecystectomy). Your health care provider may recommend that you work with a diet and nutrition specialist (dietitian) to help you reduce the amount of fat in your diet. What are tips for following this plan? General guidelines Limit your fat intake to less than 30% of your total daily calories. If you eat around 1,800 calories each day, this is less than 60 grams (g) of fat per day. Fat is an important part of a healthy diet. Eating a low-fat diet can make it hard to maintain a healthy body weight. Ask your dietitian how much fat, calories, and other nutrients you need each day. Eat small, frequent meals throughout the day instead of three large meals. Drink at least 8-10 cups of fluid a day. Drink enough fluid to keep your urine clear or pale yellow. Limit alcohol intake to no more than 1 drink a day for nonpregnant women and 2 drinks a day for men. One drink equals 12 oz of beer, 5 oz of wine, or 1 oz of hard liquor. Reading food labels  Check Nutrition Facts on food labels for the amount of fat per serving. Choose foods with less than 3 grams of fat per serving. Shopping Choose nonfat and low-fat healthy foods. Look for the words "nonfat," "low fat," or "fat free." Avoid buying processed or prepackaged foods. Cooking Cook using low-fat methods, such as baking, broiling, grilling, or boiling. Cook with small amounts of healthy fats, such as olive oil, grapeseed oil, canola oil, or sunflower oil. What foods are recommended? All fresh, frozen, or canned fruits and vegetables. Whole grains. Low-fat or non-fat (skim) milk and yogurt. Lean  meat, skinless poultry, fish, eggs, and beans. Low-fat protein supplement powders or drinks. Spices and herbs. What foods are not recommended? High-fat foods. These include baked goods, fast food, fatty cuts of meat, ice cream, french toast, sweet rolls, pizza, cheese bread, foods covered with butter, creamy sauces, or cheese. Fried foods. These include french fries, tempura, battered fish, breaded chicken, fried breads, and sweets. Foods with strong odors. Foods that cause bloating and gas. Summary A low-fat diet can be helpful if you have a gallbladder condition, or before and after gallbladder surgery. Limit your fat intake to less than 30% of your total daily calories. This is about 60 g of fat if you eat 1,800 calories each day. Eat small, frequent meals throughout the day instead of three large meals. This information is not intended to replace advice given to you by your health care provider. Make sure you discuss any questions you have with your health care provider. Document Revised: 10/10/2019 Document Reviewed: 10/16/2019 Elsevier Patient Education  2022 [place] for Gastroesophageal Reflux Disease, Adult When you have gastroesophageal reflux disease (GERD), the foods you eat and your eating habits are very important. Choosing the right foods can help ease your discomfort. Think about working with a food expert (dietitian) to help you make good choices. What are tips for following this plan? Reading food labels Look for foods that are low in saturated fat. Foods that may help with your symptoms include: Foods that have less than 5% of daily value (DV)  of fat. Foods that have 0 grams of trans fat. Cooking Do not fry your food. Cook your food by baking, steaming, grilling, or broiling. These are all methods that do not need a lot of fat for cooking. To add flavor, try to use herbs that are low in spice and acidity. Meal planning  Choose healthy foods that  are low in fat, such as: Fruits and vegetables. Whole grains. Low-fat dairy products. Lean meats, fish, and poultry. Eat small meals often instead of eating 3 large meals each day. Eat your meals slowly in a place where you are relaxed. Avoid bending over or lying down until 2-3 hours after eating. Limit high-fat foods such as fatty meats or fried foods. Limit your intake of fatty foods, such as oils, butter, and shortening. Avoid the following as told by your doctor: Foods that cause symptoms. These may be different for different people. Keep a food diary to keep track of foods that cause symptoms. Alcohol. Drinking a lot of liquid with meals. Eating meals during the 2-3 hours before bed. Lifestyle Stay at a healthy weight. Ask your doctor what weight is healthy for you. If you need to lose weight, work with your doctor to do so safely. Exercise for at least 30 minutes on 5 or more days each week, or as told by your doctor. Wear loose-fitting clothes. Do not smoke or use any products that contain nicotine or tobacco. If you need help quitting, ask your doctor. Sleep with the head of your bed higher than your feet. Use a wedge under the mattress or blocks under the bed frame to raise the head of the bed. Chew sugar-free gum after meals. What foods should eat? Eat a healthy, well-balanced diet of fruits, vegetables, whole grains, low-fat dairy products, lean meats, fish, and poultry. Each person is different. Foods that may cause symptoms in one person may not cause any symptoms in another person. Work with your doctor to find foods that are safe for you. The items listed above may not be a complete list of what you can eat and drink. Contact a food expert for more options. What foods should I avoid? Limiting some of these foods may help in managing the symptoms of GERD. Everyone is different. Talk with a food expert or your doctor to help you find the exact foods to avoid, if  any. Fruits Any fruits prepared with added fat. Any fruits that cause symptoms. For some people, this may include citrus fruits, such as oranges, grapefruit, pineapple, and lemons. Vegetables Deep-fried vegetables. [place] fries. Any vegetables prepared with added fat. Any vegetables that cause symptoms. For some people, this may include tomatoes and tomato products, chili peppers, onions and garlic, and horseradish. Grains Pastries or quick breads with added fat. Meats and other proteins High-fat meats, such as fatty beef or pork, hot dogs, ribs, ham, sausage, salami, and bacon. Fried meat or protein, including fried fish and fried chicken. Nuts and nut butters, in large amounts. Dairy Whole milk and chocolate milk. Sour cream. Cream. Ice cream. Cream cheese. Milkshakes. Fats and oils Butter. Margarine. Shortening. Ghee. Beverages Coffee and tea, with or without caffeine. Carbonated beverages. Sodas. Energy drinks. Fruit juice made with acidic fruits, such as orange or grapefruit. Tomato juice. Alcoholic drinks. Sweets and desserts Chocolate and cocoa. Donuts. Seasonings and condiments Pepper. Peppermint and spearmint. Added salt. Any condiments, herbs, or seasonings that cause symptoms. For some people, this may include curry, hot sauce, or vinegar-based salad dressings. The  items listed above may not be a complete list of what you should not eat and drink. Contact a food expert for more options. Questions to ask your doctor Diet and lifestyle changes are often the first steps that are taken to manage symptoms of GERD. If diet and lifestyle changes do not help, talk with your doctor about taking medicines. Where to find more information International Foundation for Gastrointestinal Disorders: aboutgerd.org Summary When you have GERD, food and lifestyle choices are very important in easing your symptoms. Eat small meals often instead of 3 large meals a day. Eat your meals slowly and in a  place where you are relaxed. Avoid bending over or lying down until 2-3 hours after eating. Limit high-fat foods such as fatty meats or fried foods. This information is not intended to replace advice given to you by your health care provider. Make sure you discuss any questions you have with your health care provider. Document Revised: 09/08/2019 Document Reviewed: 09/08/2019 Elsevier Patient Education  [place].

## 2021-02-15 NOTE — Progress Notes (Signed)
Cardiology Office Note:    Date:  02/15/2021   ID:  Scott Foley, DOB 11/09/1965, MRN [id number]  PCP:  [name], MD  Cardiologist:  Jenean Lindau, MD   Referring MD: [name], MD    ASSESSMENT:    1. Coronary artery disease of native artery of native heart with stable angina pectoris ([place])   2. Mixed hyperlipidemia   3. Essential hypertension   4. History of pulmonary embolism    PLAN:    In order of problems listed above:  Coronary artery disease: Secondary prevention stressed with the patient.  Importance of compliance with diet medication stressed and he vocalized understanding.  He was advised to walk at least 30 minutes a day 5 days a week and he promises to do so. Mixed dyslipidemia: Lipids are markedly elevated.  He is to go to his primary care provider in the next few days to get blood work and send me a copy.  If his lipids are elevated still I would consider medication such as Nexlizet.  Diet was emphasized. History of pulm embolism:anticoagulation followed by primary care.  Benefits and risks of anticoagulation explained and he vocalized understanding. Essential hypertension: Blood pressure stable and diet was emphasized.  His blood pressures at home are fine.  He has an element of whitecoat hypertension.  I reviewed his numbers at home. Patient will be seen in follow-up appointment in 6 months or earlier if the patient has any concerns    Medication Adjustments/Labs and Tests Ordered: Current medicines are reviewed at length with the patient today.  Concerns regarding medicines are outlined above.  No orders of the defined types were placed in this encounter.  No orders of the defined types were placed in this encounter.    No chief complaint on file.    History of Present Illness:    Scott Foley is a 55 y.o. male.  Patient has past medical history of coronary artery disease, essential hypertension, mixed dyslipidemia and a history  of pulmonary embolism.  He denies any problems at this time and takes care of activities of daily living.  No chest pain orthopnea or PND.  Time he walks 20 to 25 minutes daily and without any issues.  His lipids are markedly elevated and they are followed by his primary care provider.  At the time of my evaluation, the patient is alert awake oriented and in no distress.  Past Medical History:  Diagnosis Date   Acquired thrombophilia ([place]) 12/06/2020   Chest pain 02/09/2021   Chronic obstructive pulmonary disease ([place]) 07/16/2018   Colon polyp 10/25/2020   Coronary artery disease due to lipid rich plaque 01/01/2021   Coronary artery disease with stable angina pectoris ([place])    AMI No heart cath, treated medically [facility]   DNR (do not resuscitate) 03/03/2019   Hyperlipemia    Hypertension    Hypertensive heart disease without congestive heart failure 12/06/2020   Morbid obesity ([place]) 12/06/2020   BMI 35 with serious comorbidities including CAD, HTN, STROKE, Hyperlipidemia.   Need for vaccination 12/15/2019   Pulmonary embolus ([place]) 03/03/2019   Pulmonary nodules/lesions, multiple 11/25/2012   9/15/2014CT Chest : small nodule RUL LUL. Stable since 03/2012 Smoking cessation advised and Rx chantix Repeat CT Chest 4 /21/15: subpleural lymph nodes, likely benign repeat one year 2016>>>no change, considered benign Arlyce Harman 11/25/2012  NORMAL   Sequela, post-stroke 07/28/2019   Tobacco use disorder 11/25/2012    Past Surgical History:  Procedure Laterality  Date   COLONOSCOPY WITH PROPOFOL N/A 10/25/2020   Procedure: COLONOSCOPY WITH PROPOFOL;  Surgeon: Jonathon Bellows, MD;  Location: [facility] ENDOSCOPY;  Service: Gastroenterology;  Laterality: N/A;   TONSILLECTOMY      Current Medications: Current Meds  Medication Sig   apixaban (ELIQUIS) 5 MG TABS tablet Take 1 tablet (5 mg total) by mouth 2 (two) times daily.   clopidogrel (PLAVIX) 75 MG tablet Take 1 tablet (75 mg total) by mouth daily.    Evolocumab (REPATHA) 140 MG/ML SOSY Inject 140 mg into the skin every 14 (fourteen) days.   isosorbide mononitrate (IMDUR) 30 MG 24 hr tablet Take 1 tablet (30 mg total) by mouth daily.   lisinopril (ZESTRIL) 40 MG tablet Take 1 tablet (40 mg total) by mouth daily.   metoprolol tartrate (LOPRESSOR) 50 MG tablet Take 1 tablet (50 mg total) by mouth 2 (two) times daily.   Omega-3 Fatty Acids (FISH OIL) 1000 MG CAPS Take 1 capsule (1,000 mg total) by mouth 2 (two) times daily.   pantoprazole (PROTONIX) 40 MG tablet Take 1 tablet (40 mg total) by mouth daily.   rosuvastatin (CRESTOR) 40 MG tablet Take 1 tablet (40 mg total) by mouth daily.     Allergies:   Penicillin g   Social History   Socioeconomic History   Marital status: Married    Spouse name: Not on file   Number of children: Not on file   Years of education: Not on file   Highest education level: Not on file  Occupational History   Not on file  Tobacco Use   Smoking status: Former    Packs/day: 0.75    Years: 30.00    Pack years: 22.50    Types: Cigarettes    Start date: 03/14/1979    Quit date: 11/2020    Years since quitting: 0.2   Smokeless tobacco: Never   Tobacco comments:    Will start chantix.   Vaping Use   Vaping Use: Every day   Start date: 07/28/2018   Devices: 6 mg daily.  Substance and Sexual Activity   Alcohol use: No   Drug use: No   Sexual activity: Not on file  Other Topics Concern   Not on file  Social History Narrative   Not on file   Social Determinants of Health   Financial Resource Strain: Not on file  Food Insecurity: Not on file  Transportation Needs: Not on file  Physical Activity: Not on file  Stress: Not on file  Social Connections: Not on file     Family History: The patient's family history includes Heart attack in his brother and father; Hypertension in his brother, father, and sister; Lung cancer in his sister; Prostate cancer in his father; Stroke in his father.  ROS:    Please see the history of present illness.    All other systems reviewed and are negative.  EKGs/Labs/Other Studies Reviewed:    The following studies were reviewed today: Narrative & Impression      The study is low risk.   No ST deviation was noted.   Left ventricular function is normal. LVEF hyperdynamic (>75)   There is no evidence for ischemia   There is no significant coronary artery calcification     Recent Labs: 11/25/2020: TSH 1.870 02/09/2021: ALT 32; BUN 17; Creatinine, Ser 1.05; Hemoglobin 16.2; Platelets 261; Potassium 3.7; Sodium 138  Recent Lipid Panel    Component Value Date/Time   CHOL 252 (H) 11/25/2020 1032  TRIG 240 (H) 11/25/2020 1032   HDL 28 (L) 11/25/2020 1032   CHOLHDL 9.0 (H) 11/25/2020 1032   LDLCALC 178 (H) 11/25/2020 1032    Physical Exam:    VS:  BP (!) 152/90   Pulse (!) 52   Ht 5\' 4"  (1.626 m)   Wt 213 lb 3.2 oz (96.7 kg)   SpO2 96%   BMI 36.60 kg/m     Wt Readings from Last 3 Encounters:  02/15/21 213 lb 3.2 oz (96.7 kg)  02/15/21 212 lb (96.2 kg)  02/09/21 218 lb (98.9 kg)     GEN: Patient is in no acute distress HEENT: Normal NECK: No JVD; No carotid bruits LYMPHATICS: No lymphadenopathy CARDIAC: Hear sounds regular, 2/6 systolic murmur at the apex. RESPIRATORY:  Clear to auscultation without rales, wheezing or rhonchi  ABDOMEN: Soft, non-tender, non-distended MUSCULOSKELETAL:  No edema; No deformity  SKIN: Warm and dry NEUROLOGIC:  Alert and oriented x 3 PSYCHIATRIC:  Normal affect   Signed, Jenean Lindau, MD  02/15/2021 1:57 PM    [facility]

## 2021-02-18 ENCOUNTER — Ambulatory Visit
Admission: RE | Admit: 2021-02-18 | Discharge: 2021-02-18 | Disposition: A | Discharge status: Home / Self Care | Payer: Medicare HMO | Source: Ambulatory Visit | Attending: Nurse Practitioner | Admitting: Nurse Practitioner

## 2021-02-18 ENCOUNTER — Telehealth: Payer: Self-pay | Admitting: Nurse Practitioner

## 2021-02-18 ENCOUNTER — Other Ambulatory Visit: Payer: Self-pay

## 2021-02-18 DIAGNOSIS — K219 Gastro-esophageal reflux disease without esophagitis: Secondary | ICD-10-CM

## 2021-02-18 DIAGNOSIS — R140 Abdominal distension (gaseous): Secondary | ICD-10-CM | POA: Diagnosis not present

## 2021-02-18 DIAGNOSIS — K8020 Calculus of gallbladder without cholecystitis without obstruction: Secondary | ICD-10-CM

## 2021-02-18 DIAGNOSIS — K760 Fatty (change of) liver, not elsewhere classified: Secondary | ICD-10-CM | POA: Diagnosis not present

## 2021-02-18 DIAGNOSIS — R1013 Epigastric pain: Secondary | ICD-10-CM | POA: Diagnosis not present

## 2021-02-18 DIAGNOSIS — K7689 Other specified diseases of liver: Secondary | ICD-10-CM | POA: Diagnosis not present

## 2021-02-18 NOTE — Telephone Encounter (Signed)
Pt and spouse notified via phone of RUQ [place] positive for multiple gallstones with gallbladder wall thickening. Pt requested referral to Dr Lilia Pro at [facility] in [place]

## 2021-02-20 ENCOUNTER — Emergency Department
Admission: EM | Admit: 2021-02-20 | Discharge: 2021-02-20 | Disposition: A | Discharge status: Home / Self Care | Payer: Medicare HMO | Attending: Emergency Medicine | Admitting: Emergency Medicine

## 2021-02-20 ENCOUNTER — Other Ambulatory Visit: Payer: Self-pay

## 2021-02-20 ENCOUNTER — Encounter: Payer: Self-pay | Admitting: Emergency Medicine

## 2021-02-20 DIAGNOSIS — Z5321 Procedure and treatment not carried out due to patient leaving prior to being seen by health care provider: Secondary | ICD-10-CM | POA: Diagnosis not present

## 2021-02-20 DIAGNOSIS — R1084 Generalized abdominal pain: Secondary | ICD-10-CM | POA: Insufficient documentation

## 2021-02-20 LAB — COMPREHENSIVE METABOLIC PANEL
ALT: 36 U/L (ref 0–44)
AST: 31 U/L (ref 15–41)
Albumin: 3.6 g/dL (ref 3.5–5.0)
Alkaline Phosphatase: 67 U/L (ref 38–126)
Anion gap: 7 (ref 5–15)
BUN: 14 mg/dL (ref 6–20)
CO2: 22 mmol/L (ref 22–32)
Calcium: 8.6 mg/dL — ABNORMAL LOW (ref 8.9–10.3)
Chloride: 108 mmol/L (ref 98–111)
Creatinine, Ser: 1.01 mg/dL (ref 0.61–1.24)
GFR, Estimated: >60 mL/min (ref >60)
Glucose, Bld: 133 mg/dL — ABNORMAL HIGH (ref 70–99)
Potassium: 3.8 mmol/L (ref 3.5–5.1)
Sodium: 137 mmol/L (ref 135–145)
Total Bilirubin: 0.7 mg/dL (ref 0.3–1.2)
Total Protein: 7.1 g/dL (ref 6.5–8.1)

## 2021-02-20 LAB — CBC
HCT: 42.6 % (ref 39.0–52.0)
Hemoglobin: 14.6 g/dL (ref 13.0–17.0)
MCH: 30.4 pg (ref 26.0–34.0)
MCHC: 34.3 g/dL (ref 30.0–36.0)
MCV: 88.6 fL (ref 80.0–100.0)
Platelets: 284 10*3/uL (ref 150–400)
RBC: 4.81 MIL/uL (ref 4.22–5.81)
RDW: 13.1 % (ref 11.5–15.5)
WBC: 8 10*3/uL (ref 4.0–10.5)
nRBC: 0 % (ref 0.0–0.2)

## 2021-02-20 LAB — LIPASE, BLOOD: Lipase: 30 U/L (ref 11–51)

## 2021-02-20 NOTE — ED Triage Notes (Signed)
Pt in via POV, reports sudden onset generalized pain.  Reports known gallstones, states he has been referred to general surgery but has not had appointment set up yet. States pain has resolved at this time; wife was concerned due to severity of pain when it came on.    NAD noted.

## 2021-02-20 NOTE — ED Notes (Signed)
Pt reported to front desk that he was leaving 

## 2021-02-21 ENCOUNTER — Other Ambulatory Visit: Payer: Medicare HMO

## 2021-02-21 ENCOUNTER — Other Ambulatory Visit: Payer: Self-pay | Admitting: Nurse Practitioner

## 2021-02-21 DIAGNOSIS — E782 Mixed hyperlipidemia: Secondary | ICD-10-CM

## 2021-02-22 DIAGNOSIS — K8010 Calculus of gallbladder with chronic cholecystitis without obstruction: Secondary | ICD-10-CM | POA: Diagnosis not present

## 2021-02-22 LAB — LIPID PANEL
Chol/HDL Ratio: 3.3 ratio (ref 0.0–5.0)
Cholesterol, Total: 99 mg/dL — ABNORMAL LOW (ref 100–199)
HDL: 30 mg/dL — ABNORMAL LOW (ref >39)
LDL Chol Calc (NIH): 47 mg/dL (ref 0–99)
Triglycerides: 121 mg/dL (ref 0–149)
VLDL Cholesterol Cal: 22 mg/dL (ref 5–40)

## 2021-02-22 LAB — CARDIOVASCULAR RISK ASSESSMENT

## 2021-03-02 DIAGNOSIS — E119 Type 2 diabetes mellitus without complications: Secondary | ICD-10-CM | POA: Diagnosis not present

## 2021-03-02 DIAGNOSIS — K8020 Calculus of gallbladder without cholecystitis without obstruction: Secondary | ICD-10-CM | POA: Diagnosis not present

## 2021-03-02 DIAGNOSIS — I10 Essential (primary) hypertension: Secondary | ICD-10-CM | POA: Diagnosis not present

## 2021-03-02 DIAGNOSIS — K8010 Calculus of gallbladder with chronic cholecystitis without obstruction: Secondary | ICD-10-CM | POA: Diagnosis not present

## 2021-03-02 DIAGNOSIS — K8064 Calculus of gallbladder and bile duct with chronic cholecystitis without obstruction: Secondary | ICD-10-CM | POA: Diagnosis not present

## 2021-03-02 DIAGNOSIS — K8050 Calculus of bile duct without cholangitis or cholecystitis without obstruction: Secondary | ICD-10-CM | POA: Diagnosis not present

## 2021-03-02 DIAGNOSIS — K8030 Calculus of bile duct with cholangitis, unspecified, without obstruction: Secondary | ICD-10-CM | POA: Diagnosis not present

## 2021-03-03 DIAGNOSIS — K859 Acute pancreatitis without necrosis or infection, unspecified: Secondary | ICD-10-CM | POA: Insufficient documentation

## 2021-03-03 DIAGNOSIS — K8050 Calculus of bile duct without cholangitis or cholecystitis without obstruction: Secondary | ICD-10-CM | POA: Diagnosis not present

## 2021-03-03 DIAGNOSIS — J449 Chronic obstructive pulmonary disease, unspecified: Secondary | ICD-10-CM | POA: Diagnosis not present

## 2021-03-03 DIAGNOSIS — K838 Other specified diseases of biliary tract: Secondary | ICD-10-CM | POA: Diagnosis not present

## 2021-03-03 DIAGNOSIS — I10 Essential (primary) hypertension: Secondary | ICD-10-CM | POA: Diagnosis not present

## 2021-03-03 DIAGNOSIS — K8020 Calculus of gallbladder without cholecystitis without obstruction: Secondary | ICD-10-CM | POA: Diagnosis not present

## 2021-03-03 HISTORY — DX: Acute pancreatitis without necrosis or infection, unspecified: K85.90

## 2021-03-04 DIAGNOSIS — K859 Acute pancreatitis without necrosis or infection, unspecified: Secondary | ICD-10-CM | POA: Diagnosis not present

## 2021-03-04 DIAGNOSIS — Z86718 Personal history of other venous thrombosis and embolism: Secondary | ICD-10-CM | POA: Diagnosis not present

## 2021-03-04 DIAGNOSIS — K8020 Calculus of gallbladder without cholecystitis without obstruction: Secondary | ICD-10-CM | POA: Diagnosis not present

## 2021-03-04 DIAGNOSIS — K9189 Other postprocedural complications and disorders of digestive system: Secondary | ICD-10-CM | POA: Diagnosis not present

## 2021-03-04 DIAGNOSIS — Z86711 Personal history of pulmonary embolism: Secondary | ICD-10-CM | POA: Diagnosis not present

## 2021-03-04 DIAGNOSIS — K8010 Calculus of gallbladder with chronic cholecystitis without obstruction: Secondary | ICD-10-CM | POA: Diagnosis not present

## 2021-03-04 DIAGNOSIS — N170 Acute kidney failure with tubular necrosis: Secondary | ICD-10-CM | POA: Diagnosis not present

## 2021-03-04 DIAGNOSIS — J90 Pleural effusion, not elsewhere classified: Secondary | ICD-10-CM | POA: Diagnosis not present

## 2021-03-04 DIAGNOSIS — I10 Essential (primary) hypertension: Secondary | ICD-10-CM | POA: Diagnosis not present

## 2021-03-04 DIAGNOSIS — K8050 Calculus of bile duct without cholangitis or cholecystitis without obstruction: Secondary | ICD-10-CM | POA: Diagnosis not present

## 2021-03-04 DIAGNOSIS — J9811 Atelectasis: Secondary | ICD-10-CM | POA: Diagnosis not present

## 2021-03-04 DIAGNOSIS — D6869 Other thrombophilia: Secondary | ICD-10-CM | POA: Diagnosis not present

## 2021-03-15 DIAGNOSIS — U071 COVID-19: Secondary | ICD-10-CM | POA: Diagnosis not present

## 2021-03-15 DIAGNOSIS — R109 Unspecified abdominal pain: Secondary | ICD-10-CM | POA: Diagnosis not present

## 2021-03-15 DIAGNOSIS — K858 Other acute pancreatitis without necrosis or infection: Secondary | ICD-10-CM | POA: Diagnosis not present

## 2021-03-15 DIAGNOSIS — K760 Fatty (change of) liver, not elsewhere classified: Secondary | ICD-10-CM | POA: Diagnosis not present

## 2021-03-15 DIAGNOSIS — J9811 Atelectasis: Secondary | ICD-10-CM | POA: Diagnosis not present

## 2021-03-15 DIAGNOSIS — R1013 Epigastric pain: Secondary | ICD-10-CM | POA: Diagnosis not present

## 2021-03-15 DIAGNOSIS — R1084 Generalized abdominal pain: Secondary | ICD-10-CM | POA: Diagnosis not present

## 2021-03-16 DIAGNOSIS — E611 Iron deficiency: Secondary | ICD-10-CM | POA: Diagnosis not present

## 2021-03-16 DIAGNOSIS — J9601 Acute respiratory failure with hypoxia: Secondary | ICD-10-CM | POA: Diagnosis not present

## 2021-03-16 DIAGNOSIS — J8410 Pulmonary fibrosis, unspecified: Secondary | ICD-10-CM | POA: Diagnosis not present

## 2021-03-16 DIAGNOSIS — K760 Fatty (change of) liver, not elsewhere classified: Secondary | ICD-10-CM | POA: Diagnosis not present

## 2021-03-16 DIAGNOSIS — R109 Unspecified abdominal pain: Secondary | ICD-10-CM | POA: Diagnosis not present

## 2021-03-16 DIAGNOSIS — R0600 Dyspnea, unspecified: Secondary | ICD-10-CM | POA: Diagnosis not present

## 2021-03-16 DIAGNOSIS — K859 Acute pancreatitis without necrosis or infection, unspecified: Secondary | ICD-10-CM | POA: Diagnosis not present

## 2021-03-16 DIAGNOSIS — K858 Other acute pancreatitis without necrosis or infection: Secondary | ICD-10-CM | POA: Diagnosis not present

## 2021-03-16 DIAGNOSIS — R6520 Severe sepsis without septic shock: Secondary | ICD-10-CM | POA: Diagnosis not present

## 2021-03-16 DIAGNOSIS — D75839 Thrombocytosis, unspecified: Secondary | ICD-10-CM | POA: Diagnosis not present

## 2021-03-16 DIAGNOSIS — J9811 Atelectasis: Secondary | ICD-10-CM | POA: Diagnosis not present

## 2021-03-16 DIAGNOSIS — Z7901 Long term (current) use of anticoagulants: Secondary | ICD-10-CM | POA: Diagnosis not present

## 2021-03-16 DIAGNOSIS — J440 Chronic obstructive pulmonary disease with acute lower respiratory infection: Secondary | ICD-10-CM | POA: Diagnosis not present

## 2021-03-16 DIAGNOSIS — A4150 Gram-negative sepsis, unspecified: Secondary | ICD-10-CM | POA: Diagnosis not present

## 2021-03-16 DIAGNOSIS — R1084 Generalized abdominal pain: Secondary | ICD-10-CM | POA: Diagnosis not present

## 2021-03-16 DIAGNOSIS — J1282 Pneumonia due to coronavirus disease 2019: Secondary | ICD-10-CM | POA: Diagnosis not present

## 2021-03-16 DIAGNOSIS — U071 COVID-19: Secondary | ICD-10-CM | POA: Diagnosis not present

## 2021-03-16 DIAGNOSIS — R0902 Hypoxemia: Secondary | ICD-10-CM | POA: Diagnosis not present

## 2021-03-16 DIAGNOSIS — K8050 Calculus of bile duct without cholangitis or cholecystitis without obstruction: Secondary | ICD-10-CM | POA: Diagnosis not present

## 2021-03-21 NOTE — Progress Notes (Deleted)
Subjective:  Patient ID: Scott Foley, male    DOB: [birth year]  Age: 56 y.o. MRN: [id number]  No chief complaint on file.   GERD: 4 week follow up Current medications:  Epigastric pain:     Current Outpatient Medications on File Prior to Visit  Medication Sig Dispense Refill   apixaban (ELIQUIS) 5 MG TABS tablet Take 1 tablet (5 mg total) by mouth 2 (two) times daily. 90 tablet 0   clopidogrel (PLAVIX) 75 MG tablet Take 1 tablet (75 mg total) by mouth daily. 90 tablet 0   Evolocumab (REPATHA) 140 MG/ML SOSY Inject 140 mg into the skin every 14 (fourteen) days. 6 mL 0   isosorbide mononitrate (IMDUR) 30 MG 24 hr tablet Take 1 tablet (30 mg total) by mouth daily. 30 tablet 1   lisinopril (ZESTRIL) 40 MG tablet Take 1 tablet (40 mg total) by mouth daily. 90 tablet 0   metoprolol tartrate (LOPRESSOR) 50 MG tablet Take 1 tablet (50 mg total) by mouth 2 (two) times daily. 180 tablet 0   Omega-3 Fatty Acids (FISH OIL) 1000 MG CAPS Take 1 capsule (1,000 mg total) by mouth 2 (two) times daily. 180 capsule 0   pantoprazole (PROTONIX) 40 MG tablet Take 1 tablet (40 mg total) by mouth daily. 90 tablet 3   rosuvastatin (CRESTOR) 40 MG tablet Take 1 tablet (40 mg total) by mouth daily. 90 tablet 0   No current facility-administered medications on file prior to visit.   Past Medical History:  Diagnosis Date   Acquired thrombophilia ([place]) 12/06/2020   Chest pain 02/09/2021   Chronic obstructive pulmonary disease ([employer]) 07/16/2018   Colon polyp 10/25/2020   Coronary artery disease due to lipid rich plaque 01/01/2021   Coronary artery disease with stable angina pectoris ([place])    AMI No heart cath, treated medically [facility]   DNR (do not resuscitate) 03/03/2019   Hyperlipemia    Hypertension    Hypertensive heart disease without congestive heart failure 12/06/2020   Morbid obesity ([place]) 12/06/2020   BMI 35 with serious comorbidities including CAD, HTN, STROKE, Hyperlipidemia.   Need  for vaccination 12/15/2019   Pulmonary embolus ([place]) 03/03/2019   Pulmonary nodules/lesions, multiple 11/25/2012   9/15/2014CT Chest : small nodule RUL LUL. Stable since 03/2012 Smoking cessation advised and Rx chantix Repeat CT Chest 4 /21/15: subpleural lymph nodes, likely benign repeat one year 2016>>>no change, considered benign Arlyce Harman 11/25/2012  NORMAL   Sequela, post-stroke 07/28/2019   Tobacco use disorder 11/25/2012   Past Surgical History:  Procedure Laterality Date   COLONOSCOPY WITH PROPOFOL N/A 10/25/2020   Procedure: COLONOSCOPY WITH PROPOFOL;  Surgeon: Jonathon Bellows, MD;  Location: [facility] ENDOSCOPY;  Service: Gastroenterology;  Laterality: N/A;   TONSILLECTOMY      Family History  Problem Relation Age of Onset   Hypertension Father    Heart attack Father    Stroke Father    Prostate cancer Father    Hypertension Sister    Hypertension Brother    Heart attack Brother    Lung cancer Sister    Social History   Socioeconomic History   Marital status: Married    Spouse name: Not on file   Number of children: Not on file   Years of education: Not on file   Highest education level: Not on file  Occupational History   Not on file  Tobacco Use   Smoking status: Former    Packs/day: 0.75    Years: 30.00  Pack years: 22.50    Types: Cigarettes    Start date: 03/14/1979    Quit date: 11/2020    Years since quitting: 0.3   Smokeless tobacco: Never   Tobacco comments:    Will start chantix.   Vaping Use   Vaping Use: Former   Start date: 07/28/2018  Substance and Sexual Activity   Alcohol use: No   Drug use: No   Sexual activity: Not on file  Other Topics Concern   Not on file  Social History Narrative   Not on file   Social Determinants of Health   Financial Resource Strain: Not on file  Food Insecurity: Not on file  Transportation Needs: Not on file  Physical Activity: Not on file  Stress: Not on file  Social Connections: Not on file    Review of Systems   Constitutional:  Negative for appetite change, fatigue and fever.  HENT:  Negative for congestion, ear pain and sore throat.   Respiratory:  Negative for cough and shortness of breath.   Cardiovascular:  Negative for chest pain and leg swelling.  Gastrointestinal:  Negative for abdominal pain, constipation, diarrhea, nausea and vomiting.  Genitourinary:  Negative for dysuria and frequency.  Musculoskeletal:  Negative for arthralgias and myalgias.  Neurological:  Negative for dizziness and headaches.  Psychiatric/Behavioral:  Negative for dysphoric mood. The patient is not nervous/anxious.     Objective:  There were no vitals taken for this visit.  BP/Weight 02/20/2021 02/15/2021 25/0/5397  Systolic BP 673 419 379  Diastolic BP 96 90 78  Wt. (Lbs) 211 213.2 212  BMI 36.22 36.6 36.39    Physical Exam Vitals reviewed.  Constitutional:      Appearance: Normal appearance. He is normal weight.  Cardiovascular:     Rate and Rhythm: Normal rate and regular rhythm.     Pulses: Normal pulses.     Heart sounds: Normal heart sounds.  Pulmonary:     Breath sounds: Normal breath sounds.  Abdominal:     General: Abdomen is flat. Bowel sounds are normal.     Palpations: Abdomen is soft.  Neurological:     Mental Status: He is alert and oriented to person, place, and time.  Psychiatric:        Mood and Affect: Mood normal.        Behavior: Behavior normal.    Diabetic Foot Exam - Simple   No data filed      Lab Results  Component Value Date   WBC 8.0 02/20/2021   HGB 14.6 02/20/2021   HCT 42.6 02/20/2021   PLT 284 02/20/2021   GLUCOSE 133 (H) 02/20/2021   CHOL 99 (L) 02/21/2021   TRIG 121 02/21/2021   HDL 30 (L) 02/21/2021   LDLCALC 47 02/21/2021   ALT 36 02/20/2021   AST 31 02/20/2021   NA 137 02/20/2021   K 3.8 02/20/2021   CL 108 02/20/2021   CREATININE 1.01 02/20/2021   BUN 14 02/20/2021   CO2 22 02/20/2021   TSH 1.870 11/25/2020   INR 2.0 (H) 04/26/2019       Assessment & Plan:   Problem List Items Addressed This Visit   None Visit Diagnoses     Epigastric pain    -  Primary   Gastroesophageal reflux disease without esophagitis         .  No orders of the defined types were placed in this encounter.   No orders of the defined types were placed  in this encounter.    Follow-up: No follow-ups on file.  An After Visit Summary was printed and given to the patient.  [name], MD [name] [phone]

## 2021-03-22 ENCOUNTER — Ambulatory Visit (INDEPENDENT_AMBULATORY_CARE_PROVIDER_SITE_OTHER): Payer: Medicare HMO | Admitting: Family Medicine

## 2021-03-22 DIAGNOSIS — K219 Gastro-esophageal reflux disease without esophagitis: Secondary | ICD-10-CM

## 2021-03-22 DIAGNOSIS — R1013 Epigastric pain: Secondary | ICD-10-CM

## 2021-04-01 ENCOUNTER — Other Ambulatory Visit: Payer: Self-pay

## 2021-04-01 ENCOUNTER — Other Ambulatory Visit: Payer: Self-pay | Admitting: Nurse Practitioner

## 2021-04-01 ENCOUNTER — Ambulatory Visit (INDEPENDENT_AMBULATORY_CARE_PROVIDER_SITE_OTHER): Payer: Medicare HMO | Admitting: Nurse Practitioner

## 2021-04-01 ENCOUNTER — Encounter: Payer: Self-pay | Admitting: Nurse Practitioner

## 2021-04-01 VITALS — BP 128/82 | HR 89 | Temp 97.4°F | Ht 64.0 in | Wt 187.0 lb

## 2021-04-01 DIAGNOSIS — I2510 Atherosclerotic heart disease of native coronary artery without angina pectoris: Secondary | ICD-10-CM

## 2021-04-01 DIAGNOSIS — Z9981 Dependence on supplemental oxygen: Secondary | ICD-10-CM | POA: Diagnosis not present

## 2021-04-01 DIAGNOSIS — Z8616 Personal history of COVID-19: Secondary | ICD-10-CM

## 2021-04-01 DIAGNOSIS — J9611 Chronic respiratory failure with hypoxia: Secondary | ICD-10-CM | POA: Diagnosis not present

## 2021-04-01 DIAGNOSIS — D649 Anemia, unspecified: Secondary | ICD-10-CM | POA: Diagnosis not present

## 2021-04-01 DIAGNOSIS — R7881 Bacteremia: Secondary | ICD-10-CM

## 2021-04-01 DIAGNOSIS — I2782 Chronic pulmonary embolism: Secondary | ICD-10-CM

## 2021-04-01 DIAGNOSIS — B961 Klebsiella pneumoniae [K. pneumoniae] as the cause of diseases classified elsewhere: Secondary | ICD-10-CM

## 2021-04-01 DIAGNOSIS — K861 Other chronic pancreatitis: Secondary | ICD-10-CM | POA: Diagnosis not present

## 2021-04-01 DIAGNOSIS — I119 Hypertensive heart disease without heart failure: Secondary | ICD-10-CM

## 2021-04-01 DIAGNOSIS — Z8619 Personal history of other infectious and parasitic diseases: Secondary | ICD-10-CM | POA: Diagnosis not present

## 2021-04-01 DIAGNOSIS — K9189 Other postprocedural complications and disorders of digestive system: Secondary | ICD-10-CM

## 2021-04-01 DIAGNOSIS — E782 Mixed hyperlipidemia: Secondary | ICD-10-CM

## 2021-04-01 DIAGNOSIS — D508 Other iron deficiency anemias: Secondary | ICD-10-CM

## 2021-04-01 DIAGNOSIS — I2583 Coronary atherosclerosis due to lipid rich plaque: Secondary | ICD-10-CM

## 2021-04-01 DIAGNOSIS — J449 Chronic obstructive pulmonary disease, unspecified: Secondary | ICD-10-CM | POA: Diagnosis not present

## 2021-04-01 DIAGNOSIS — K859 Acute pancreatitis without necrosis or infection, unspecified: Secondary | ICD-10-CM | POA: Diagnosis not present

## 2021-04-01 DIAGNOSIS — K219 Gastro-esophageal reflux disease without esophagitis: Secondary | ICD-10-CM

## 2021-04-01 LAB — CBC WITH DIFFERENTIAL/PLATELET
Basophils Absolute: 0.1 10*3/uL (ref 0.0–0.2)
Basos: 1 %
EOS (ABSOLUTE): 0.2 10*3/uL (ref 0.0–0.4)
Eos: 1 %
Hematocrit: 34 % — ABNORMAL LOW (ref 37.5–51.0)
Hemoglobin: 11.6 g/dL — ABNORMAL LOW (ref 13.0–17.7)
Immature Grans (Abs): 0.2 10*3/uL — ABNORMAL HIGH (ref 0.0–0.1)
Immature Granulocytes: 1 %
Lymphocytes Absolute: 2.3 10*3/uL (ref 0.7–3.1)
Lymphs: 11 %
MCH: 29.4 pg (ref 26.6–33.0)
MCHC: 34.1 g/dL (ref 31.5–35.7)
MCV: 86 fL (ref 79–97)
Monocytes Absolute: 1.3 10*3/uL — ABNORMAL HIGH (ref 0.1–0.9)
Monocytes: 6 %
Neutrophils Absolute: 16.9 10*3/uL — ABNORMAL HIGH (ref 1.4–7.0)
Neutrophils: 80 %
Platelets: 417 10*3/uL (ref 150–450)
RBC: 3.94 x10E6/uL — ABNORMAL LOW (ref 4.14–5.80)
RDW: 14.2 % (ref 11.6–15.4)
WBC: 20.9 10*3/uL (ref 3.4–10.8)

## 2021-04-01 LAB — COMPREHENSIVE METABOLIC PANEL
ALT: 28 IU/L (ref 0–44)
AST: 19 IU/L (ref 0–40)
Albumin/Globulin Ratio: 0.9 — ABNORMAL LOW (ref 1.2–2.2)
Albumin: 3.1 g/dL — ABNORMAL LOW (ref 3.8–4.9)
Alkaline Phosphatase: 141 IU/L — ABNORMAL HIGH (ref 44–121)
BUN/Creatinine Ratio: 15 (ref 9–20)
BUN: 24 mg/dL (ref 6–24)
Bilirubin Total: 0.6 mg/dL (ref 0.0–1.2)
CO2: 25 mmol/L (ref 20–29)
Calcium: 9.3 mg/dL (ref 8.7–10.2)
Chloride: 100 mmol/L (ref 96–106)
Creatinine, Ser: 1.58 mg/dL — ABNORMAL HIGH (ref 0.76–1.27)
Globulin, Total: 3.4 g/dL (ref 1.5–4.5)
Glucose: 86 mg/dL (ref 70–99)
Potassium: 4.4 mmol/L (ref 3.5–5.2)
Sodium: 140 mmol/L (ref 134–144)
Total Protein: 6.5 g/dL (ref 6.0–8.5)
eGFR: 51 mL/min/{1.73_m2} — ABNORMAL LOW (ref >59)

## 2021-04-01 MED ORDER — ROSUVASTATIN CALCIUM 40 MG PO TABS: 40.0000 mg | ORAL_TABLET | Freq: Every day | ORAL | 0 refills | Status: DC

## 2021-04-01 MED ORDER — APIXABAN 5 MG PO TABS: 5.0000 mg | ORAL_TABLET | Freq: Two times a day (BID) | ORAL | 0 refills | Status: DC

## 2021-04-01 MED ORDER — ISOSORBIDE MONONITRATE ER 60 MG PO TB24: 60.0000 mg | ORAL_TABLET | Freq: Every day | ORAL | 0 refills | Status: DC

## 2021-04-01 MED ORDER — LISINOPRIL 40 MG PO TABS: 40.0000 mg | ORAL_TABLET | Freq: Every day | ORAL | 0 refills | Status: DC

## 2021-04-01 MED ORDER — METOPROLOL TARTRATE 50 MG PO TABS: 50.0000 mg | ORAL_TABLET | Freq: Two times a day (BID) | ORAL | 0 refills | Status: DC

## 2021-04-01 MED ORDER — CLOPIDOGREL BISULFATE 75 MG PO TABS: 75.0000 mg | ORAL_TABLET | Freq: Every day | ORAL | 0 refills | Status: DC

## 2021-04-01 NOTE — Patient Instructions (Addendum)
We will call you with lab results and pulmonology appointment Iron rich diet Take stool softener daily Follow-up in 84-months fasting with Dr Tobie Poet  Iron-Rich Diet Iron is a mineral that helps your body produce hemoglobin. Hemoglobin is a protein in red blood cells that carries oxygen to your body's tissues. Eating too little iron may cause you to feel weak and tired, and it can increase your risk of infection. Iron is naturally found in many foods, and many foods have iron added to them (are iron-fortified). You may need to follow an iron-rich diet if you do not have enough iron in your body due to certain medical conditions. The amount of iron that you need each day depends on your age, your sex, and any medical conditions you have. Follow instructions from your health care provider or a dietitian about how much iron you should eat each day. What are tips for following this plan? Reading food labels Check food labels to see how many milligrams (mg) of iron are in each serving. Cooking Cook foods in pots and pans that are made from iron. Take these steps to make it easier for your body to absorb iron from certain foods: Soak beans overnight before cooking. Soak whole grains overnight and drain them before using. Ferment flours before baking, such as by using yeast in bread dough. Meal planning When you eat foods that contain iron, you should eat them with foods that are high in vitamin C. These include oranges, peppers, tomatoes, potatoes, and mangoes. Vitamin C helps your body absorb iron. Certain foods and drinks prevent your body from absorbing iron properly. Avoid eating these foods in the same meal as iron-rich foods or with iron supplements. These foods include: Coffee, black tea, and red wine. Milk, dairy products, and foods that are high in calcium. Beans and soybeans. Whole grains. General information Take iron supplements only as told by your health care provider. An overdose of iron  can be life-threatening. If you were prescribed iron supplements, take them with orange juice or a vitamin C supplement. When you eat iron-fortified foods or take an iron supplement, you should also eat foods that naturally contain iron, such as meat, poultry, and fish. Eating naturally iron-rich foods helps your body absorb the iron that is added to other foods or contained in a supplement. Iron from animal sources is better absorbed than iron from plant sources. What foods should I eat? Fruits Prunes. Raisins. Eat fruits high in vitamin C, such as oranges, grapefruits, and strawberries, with iron-rich foods. Vegetables Spinach (cooked). Green peas. Broccoli. Fermented vegetables. Eat vegetables high in vitamin C, such as leafy greens, potatoes, bell peppers, and tomatoes, with iron-rich foods. Grains Iron-fortified breakfast cereal. Iron-fortified whole-wheat bread. Enriched rice. Sprouted grains. Meats and other proteins Beef liver. Beef. [place]. Chicken. Oysters. Shrimp. [place]. Sardines. Chickpeas. Nuts. Tofu. Pumpkin seeds. Beverages Tomato juice. Fresh orange juice. Prune juice. Hibiscus tea. Iron-fortified instant breakfast shakes. Sweets and desserts Blackstrap molasses. Seasonings and condiments Tahini. Fermented soy sauce. Other foods Wheat germ. The items listed above may not be a complete list of recommended foods and beverages. Contact a dietitian for more information. What foods should I limit? These are foods that should be limited while eating iron-rich foods as they can reduce the absorption of iron in your body. Grains Whole grains. Bran cereal. Bran flour. Meats and other proteins Soybeans. Products made from soy protein. Black beans. Lentils. Mung beans. Split peas. Dairy Milk. Cream. Cheese. Yogurt. Cottage cheese. Beverages  Coffee. Black tea. Red wine. Sweets and desserts Cocoa. Chocolate. Ice cream. Seasonings and condiments Basil. Oregano. Large amounts of  parsley. The items listed above may not be a complete list of foods and beverages you should limit. Contact a dietitian for more information. Summary Iron is a mineral that helps your body produce hemoglobin. Hemoglobin is a protein in red blood cells that carries oxygen to your body's tissues. Iron is naturally found in many foods, and many foods have iron added to them (are iron-fortified). When you eat foods that contain iron, you should eat them with foods that are high in vitamin C. Vitamin C helps your body absorb iron. Certain foods and drinks prevent your body from absorbing iron properly, such as whole grains and dairy products. You should avoid eating these foods in the same meal as iron-rich foods or with iron supplements. This information is not intended to replace advice given to you by your health care provider. Make sure you discuss any questions you have with your health care provider. Document Revised: 02/09/2020 Document Reviewed: 02/09/2020 Elsevier Patient Education  2022 [place].   Iron Deficiency Anemia, Adult Iron deficiency anemia is when you do not have enough red blood cells or hemoglobin in your blood. This happens because you have too little iron in your body. Hemoglobin carries oxygen to parts of the body. Anemia can cause your body to not get enough oxygen. What are the causes? Not eating enough foods that have iron in them. The body not being able to take in iron well. Needing more iron due to pregnancy or heavy menstrual periods, for females. Cancer. Bleeding in the bowels. Many blood draws. What increases the risk? Being pregnant. Being a teenage girl going through a growth spurt. What are the signs or symptoms? Pale skin, lips, and nails. Weakness, dizziness, and getting tired easily. Headache. Feeling like you cannot breathe well when moving (shortness of breath). Cold hands and feet. Fast heartbeat or a heartbeat that is not regular. Feeling  grouchy (irritable) or breathing fast. These are more common in very bad anemia. Mild anemia may not cause any symptoms. How is this treated? This condition is treated by finding out why you do not have enough iron and then getting more iron. It may include: Adding foods to your diet that have a lot of iron. Taking iron pills (supplements). If you are pregnant or breastfeeding, you may need to take extra iron. Your diet often does not provide the amount of iron that you need. Getting more vitamin C in your diet. Vitamin C helps your body take in iron. You may need to take iron pills with a glass of orange juice or vitamin C pills. Medicines to make heavy menstrual periods lighter. Surgery. You may need blood tests to see if treatment is working. If the treatment does not seem to be working, you may need more tests. Follow these instructions at home: Medicines Take over-the-counter and prescription medicines only as told by your doctor. This includes iron pills and vitamins. Take iron pills when your stomach is empty. If you cannot handle this, take them with food. Do not drink milk or take antacids at the same time as your iron pills. Iron pills may turn your poop (stool)black. If you cannot handle taking iron pills by mouth, ask your doctor about getting iron through: An IV tube. A shot (injection) into a muscle. Eating and drinking  Talk with your doctor before changing the foods you eat. He  or she may tell you to eat foods that have a lot of iron, such as: Liver. Low-fat (lean) beef. Breads and cereals that have iron added to them. Eggs. Dried fruit. Dark green, leafy vegetables. Eat fresh fruits and vegetables that are high in vitamin C. They help your body use iron. Foods with a lot of vitamin C include: Oranges. Peppers. Tomatoes. Mangoes. Drink enough fluid to keep your pee (urine) pale yellow. Managing constipation If you are taking iron pills, they may cause trouble  pooping (constipation). To prevent or treat trouble pooping, you may need to: Take over-the-counter or prescription medicines. Eat foods that are high in fiber. These include beans, whole grains, and fresh fruits and vegetables. Limit foods that are high in fat and sugar. These include fried or sweet foods. General instructions Return to your normal activities as told by your doctor. Ask your doctor what activities are safe for you. Keep yourself clean, and keep things clean around you. Keep all follow-up visits as told by your doctor. This is important. Contact a doctor if: You feel like you may vomit (nauseous), or you vomit. You feel weak. You are sweating for no reason. You have trouble pooping, such as: Pooping less than 3 times a week. Straining to poop. Having poop that is hard, dry, or larger than normal. Feeling full or bloated. Pain in the lower belly. Not feeling better after pooping. Get help right away if: You pass out (faint). You have chest pain. You have trouble breathing that: Is very bad. Gets worse with physical activity. You have a fast heartbeat, or a heartbeat that does not feel regular. You get light-headed when getting up from sitting or lying down. These symptoms may be an emergency. Do not wait to see if the symptoms will go away. Get medical help right away. Call your local emergency services (911 in the [place] Do not drive yourself to the hospital. Summary Iron deficiency anemia is when you have too little iron in your body. This condition is treated by finding out why you do not have enough iron in your body and then getting more iron. Take over-the-counter and prescription medicines only as told by your doctor. Eat fresh fruits and vegetables that are high in vitamin C. Get help right away if you cannot breathe well. This information is not intended to replace advice given to you by your health care provider. Make sure you discuss any questions you have  with your health care provider. Document Revised: 11/05/2018 Document Reviewed: 11/05/2018 Elsevier Patient Education  [place].

## 2021-04-01 NOTE — Progress Notes (Addendum)
Subjective:  Patient ID: Scott Foley, male    DOB: 06/22/1965  Age: 56 y.o. MRN: [id number]  Chief Complaint  Patient presents with   Hospitalization Follow-up    HPI  Scott Foley is a 56 year old Caucasian male that presents for hospital follow-up of acute pancreatitis, sepsis, and COVID-19. He is accompanied by his spouse. He underwent OP cholecystectomy with Dr Lilia Pro 03/02/21. He was found to have abnormal intraoperative cholangiogram, underwent ERCP with Dr Lyda Jester on 03/03/21. He developed pancreatitis s/p ERCP, treated conservatively and was d/c from [facility] on 03/09/21.He was readmitted to [facility] with recurrent pancreatitis and  COVID-19 on 03/16/21.He developed sepsis and respiratory failure. Blood cultures positive for Klebsiella pneumoniae, treated with Invanz IV for 10 days, no antibiotics given at d/c. He is currently on home O2 at 2L/min. Hospitalist recommended pulmonology referral due to COPD and hypoxia. No maintenance or rescue inhalers on medication list. He quit smoking four months ago. History of CVA in 2014, bilateral PE in 2020, currently prescribed Eliquis 5 mg BID and Plavix 75 mg. Cardiac history includes CAD, previous MI, and HTN.    Follow up Hospitalization  Patient was admitted to [facility] on 03/16/2021 and discharged on 03/28/2021. He was treated for Pancreatitis, sepsis, and respiratory failure due to COVID-19 He reports good compliance with treatment. He reports this condition is improved.  Current Outpatient Medications on File Prior to Visit  Medication Sig Dispense Refill   apixaban (ELIQUIS) 5 MG TABS tablet Take 1 tablet (5 mg total) by mouth 2 (two) times daily. 90 tablet 0   clopidogrel (PLAVIX) 75 MG tablet Take 1 tablet (75 mg total) by mouth daily. 90 tablet 0   Evolocumab (REPATHA) 140 MG/ML SOSY Inject 140 mg into the skin every 14 (fourteen) days. 6 mL 0   lisinopril (ZESTRIL) 40 MG tablet Take 1 tablet (40 mg total) by  mouth daily. 90 tablet 0   metoprolol tartrate (LOPRESSOR) 50 MG tablet Take 1 tablet (50 mg total) by mouth 2 (two) times daily. 180 tablet 0   Omega-3 Fatty Acids (FISH OIL) 1000 MG CAPS Take 1 capsule (1,000 mg total) by mouth 2 (two) times daily. 180 capsule 0   pantoprazole (PROTONIX) 40 MG tablet Take 1 tablet (40 mg total) by mouth daily. 90 tablet 3   rosuvastatin (CRESTOR) 40 MG tablet Take 1 tablet (40 mg total) by mouth daily. 90 tablet 0   No current facility-administered medications on file prior to visit.   Past Medical History:  Diagnosis Date   Acquired thrombophilia ([place]) 12/06/2020   Chest pain 02/09/2021   Chronic obstructive pulmonary disease ([place]) 07/16/2018   Colon polyp 10/25/2020   Coronary artery disease due to lipid rich plaque 01/01/2021   Coronary artery disease with stable angina pectoris ([place])    AMI No heart cath, treated medically [facility]   DNR (do not resuscitate) 03/03/2019   Hyperlipemia    Hypertension    Hypertensive heart disease without congestive heart failure 12/06/2020   Morbid obesity ([place]) 12/06/2020   BMI 35 with serious comorbidities including CAD, HTN, STROKE, Hyperlipidemia.   Need for vaccination 12/15/2019   Pulmonary embolus ([place]) 03/03/2019   Pulmonary nodules/lesions, multiple 11/25/2012   9/15/2014CT Chest : small nodule RUL LUL. Stable since 03/2012 Smoking cessation advised and Rx chantix Repeat CT Chest 4 /21/15: subpleural lymph nodes, likely benign repeat one year 2016>>>no change, considered benign Arlyce Harman 11/25/2012  NORMAL   [place], post-stroke 07/28/2019   Tobacco use disorder  11/25/2012   Past Surgical History:  Procedure Laterality Date   COLONOSCOPY WITH PROPOFOL N/A 10/25/2020   Procedure: COLONOSCOPY WITH PROPOFOL;  Surgeon: Jonathon Bellows, MD;  Location: [facility] ENDOSCOPY;  Service: Gastroenterology;  Laterality: N/A;   TONSILLECTOMY      Family History  Problem Relation Age of Onset   Hypertension Father    Heart  attack Father    Stroke Father    Prostate cancer Father    Hypertension Sister    Hypertension Brother    Heart attack Brother    Lung cancer Sister    Social History   Socioeconomic History   Marital status: Married    Spouse name: Not on file   Number of children: Not on file   Years of education: Not on file   Highest education level: Not on file  Occupational History   Not on file  Tobacco Use   Smoking status: Former    Packs/day: 0.75    Years: 30.00    Pack years: 22.50    Types: Cigarettes    Start date: 03/14/1979    Quit date: 11/2020    Years since quitting: 0.3   Smokeless tobacco: Never   Tobacco comments:    Will start chantix.   Vaping Use   Vaping Use: Former   Start date: 07/28/2018  Substance and Sexual Activity   Alcohol use: No   Drug use: No   Sexual activity: Not on file  Other Topics Concern   Not on file  Social History Narrative   Not on file   Social Determinants of Health   Financial Resource Strain: Not on file  Food Insecurity: Not on file  Transportation Needs: Not on file  Physical Activity: Not on file  Stress: Not on file  Social Connections: Not on file    Review of Systems  Constitutional:  Positive for appetite change (decreased) and fatigue.  Respiratory:  Positive for shortness of breath (with activity).   Neurological:  Positive for weakness.  All other systems reviewed and are negative.   Objective:  Pulse 89    Temp (!) 97.4 F (36.3 C)    Ht 5\' 4"  (1.626 m)    Wt 187 lb (84.8 kg)    SpO2 96% Comment: on 2L O2   BMI 32.10 kg/m  BP 128/82    Pulse 89    Temp (!) 97.4 F (36.3 C)    Ht 5\' 4"  (1.626 m)    Wt 187 lb (84.8 kg)    SpO2 96% Comment: on 2L O2   BMI 32.10 kg/m    BP/Weight 04/01/2021 02/20/2021 81/10/2991  Systolic BP - 716 967  Diastolic BP - 96 90  Wt. (Lbs) 187 211 213.2  BMI 32.1 36.22 36.6    Physical Exam Vitals reviewed.  Constitutional:      Appearance: Normal appearance. He is  ill-appearing (O2 via [place] in place).  HENT:     Right Ear: Tympanic membrane normal.     Left Ear: Tympanic membrane normal.     Mouth/Throat:     Mouth: Mucous membranes are moist.  Cardiovascular:     Rate and Rhythm: Normal rate and regular rhythm.     Pulses: Normal pulses.     Heart sounds: Normal heart sounds.  Pulmonary:     Effort: Pulmonary effort is normal.     Comments: O2 at 2L/min via [place] in place; Lung sounds diminished in all fields Abdominal:     General:  Bowel sounds are normal.     Palpations: Abdomen is soft.  Skin:    General: Skin is warm and dry.     Capillary Refill: Capillary refill takes less than 2 seconds.  Neurological:     Mental Status: He is alert and oriented to person, place, and time. Mental status is at baseline.  Psychiatric:        Mood and Affect: Mood normal.        Behavior: Behavior normal.        Lab Results  Component Value Date   WBC 8.0 02/20/2021   HGB 14.6 02/20/2021   HCT 42.6 02/20/2021   PLT 284 02/20/2021   GLUCOSE 133 (H) 02/20/2021   CHOL 99 (L) 02/21/2021   TRIG 121 02/21/2021   HDL 30 (L) 02/21/2021   LDLCALC 47 02/21/2021   ALT 36 02/20/2021   AST 31 02/20/2021   NA 137 02/20/2021   K 3.8 02/20/2021   CL 108 02/20/2021   CREATININE 1.01 02/20/2021   BUN 14 02/20/2021   CO2 22 02/20/2021   TSH 1.870 11/25/2020   INR 2.0 (H) 04/26/2019      Assessment & Plan:   1. Recurrent acute pancreatitis - CBC with Differential/Platelet - Comprehensive metabolic panel  2. Acute pancreatitis after endoscopic retrograde cholangiopancreatography (ERCP) - CBC with Differential/Platelet - Comprehensive metabolic panel  3. Chronic respiratory failure with hypoxia (HCC) - Ambulatory referral to Pulmonology - Fluticasone-Umeclidin-Vilant (TRELEGY ELLIPTA) 100-62.5-25 MCG/ACT AEPB; Inhale 1 puff into the lungs daily.  Dispense: 1 each; Refill: 11 - albuterol (VENTOLIN HFA) 108 (90 Base) MCG/ACT inhaler; Inhale 2 puffs  into the lungs every 6 (six) hours as needed for wheezing or shortness of breath.  Dispense: 8 g; Refill: 0 - AMB Referral to [place]  4. Chronic obstructive pulmonary disease, unspecified COPD type ([place]) - Ambulatory referral to Pulmonology - Fluticasone-Umeclidin-Vilant (TRELEGY ELLIPTA) 100-62.5-25 MCG/ACT AEPB; Inhale 1 puff into the lungs daily.  Dispense: 1 each; Refill: 11 - albuterol (VENTOLIN HFA) 108 (90 Base) MCG/ACT inhaler; Inhale 2 puffs into the lungs every 6 (six) hours as needed for wheezing or shortness of breath.  Dispense: 8 g; Refill: 0 - AMB Referral to [place]  5. Dependence on continuous supplemental oxygen - Ambulatory referral to Pulmonology - Fluticasone-Umeclidin-Vilant (TRELEGY ELLIPTA) 100-62.5-25 MCG/ACT AEPB; Inhale 1 puff into the lungs daily.  Dispense: 1 each; Refill: 11 - albuterol (VENTOLIN HFA) 108 (90 Base) MCG/ACT inhaler; Inhale 2 puffs into the lungs every 6 (six) hours as needed for wheezing or shortness of breath.  Dispense: 8 g; Refill: 0 - AMB Referral to [place]  6. Other iron deficiency anemia - CBC with Differential/Platelet - AMB Referral to [place]  7. Bacteremia due to Klebsiella pneumoniae - CBC with Differential/Platelet - Comprehensive metabolic panel  8. History of sepsis - CBC with Differential/Platelet - Comprehensive metabolic panel  9. History of COVID-19 - Ambulatory referral to Pulmonology  10. Coronary artery disease due to lipid rich plaque - AMB Referral to [place] -Pt needs assistance with medication due to financial constraints    . We will call you with lab results and pulmonology appointment Iron rich diet Take stool softener daily Follow-up in 41-months fasting with Dr Tobie Poet      Follow-up: 18-months fasting with Dr Tobie Poet  An After Visit Summary was printed and given to the patient.  I, Rip Harbour, NP, have  reviewed all documentation for this  visit. The documentation on 04/02/21 for the exam, diagnosis, procedures, and orders are all accurate and complete.     Signed, Rip Harbour, NP [place] [phone]

## 2021-04-04 ENCOUNTER — Other Ambulatory Visit: Payer: Self-pay

## 2021-04-04 DIAGNOSIS — D72829 Elevated white blood cell count, unspecified: Secondary | ICD-10-CM

## 2021-04-04 MED ORDER — ALBUTEROL SULFATE HFA 108 (90 BASE) MCG/ACT IN AERS: 2.0000 | INHALATION_SPRAY | Freq: Four times a day (QID) | RESPIRATORY_TRACT | 0 refills | Status: DC | PRN

## 2021-04-04 MED ORDER — TRELEGY ELLIPTA 100-62.5-25 MCG/ACT IN AEPB: 1.0000 | INHALATION_SPRAY | Freq: Every day | RESPIRATORY_TRACT | 11 refills | Status: DC

## 2021-04-04 NOTE — Addendum Note (Signed)
Addended by: Rip Harbour on: 04/04/2021 01:53 PM   Modules accepted: Orders

## 2021-04-05 ENCOUNTER — Ambulatory Visit: Payer: Medicare HMO | Admitting: Family Medicine

## 2021-04-05 LAB — IRON AND TIBC
Iron Saturation: 17 % (ref 15–55)
Iron: 30 ug/dL — ABNORMAL LOW (ref 38–169)
Total Iron Binding Capacity: 174 ug/dL — ABNORMAL LOW (ref 250–450)
UIBC: 144 ug/dL (ref 111–343)

## 2021-04-05 LAB — FERRITIN: Ferritin: 1626 ng/mL — ABNORMAL HIGH (ref 30–400)

## 2021-04-05 LAB — SPECIMEN STATUS REPORT

## 2021-04-06 ENCOUNTER — Telehealth: Payer: Self-pay | Admitting: Family Medicine

## 2021-04-06 ENCOUNTER — Other Ambulatory Visit: Payer: Self-pay | Admitting: Nurse Practitioner

## 2021-04-06 DIAGNOSIS — Z86711 Personal history of pulmonary embolism: Secondary | ICD-10-CM

## 2021-04-06 DIAGNOSIS — Z8673 Personal history of transient ischemic attack (TIA), and cerebral infarction without residual deficits: Secondary | ICD-10-CM

## 2021-04-06 DIAGNOSIS — Z862 Personal history of diseases of the blood and blood-forming organs and certain disorders involving the immune mechanism: Secondary | ICD-10-CM

## 2021-04-06 DIAGNOSIS — I252 Old myocardial infarction: Secondary | ICD-10-CM

## 2021-04-06 DIAGNOSIS — D509 Iron deficiency anemia, unspecified: Secondary | ICD-10-CM

## 2021-04-06 NOTE — Chronic Care Management (AMB) (Signed)
°  Chronic Care Management   Note  04/06/2021 Name: Mackinley Kiehn MRN: [id number] DOB: 1965/03/26  Uzoma Vivona is a 56 y.o. year old male who is a primary care patient of [name], [name], MD. I reached out to Elvera Maria by phone today in response to a referral sent by Mr. Andrian Sabala Mazurek's PCP, [name], Elnita Maxwell, MD.   Mr. Haapala was given information about Chronic Care Management services today including:  CCM service includes personalized support from designated clinical staff supervised by his physician, including individualized plan of care and coordination with other care providers 24/7 contact phone numbers for assistance for urgent and routine care needs. Service will only be billed when office clinical staff spend 20 minutes or more in a month to coordinate care. Only one practitioner may furnish and bill the service in a calendar month. The patient may stop CCM services at any time (effective at the end of the month) by phone call to the office staff.   Patient agreed to services and verbal consent obtained.   Follow up plan:   Tatjana Secretary/administrator

## 2021-04-07 ENCOUNTER — Telehealth: Payer: Self-pay | Admitting: Oncology

## 2021-04-07 NOTE — Telephone Encounter (Signed)
Spoke w/ pt's wife, Arrie Aran. Pt has been scheduled for an appt for 04/25/21 , unable to do sooner due to pt already having previously scheduled appts in early February.

## 2021-04-12 DIAGNOSIS — K851 Biliary acute pancreatitis without necrosis or infection: Secondary | ICD-10-CM | POA: Diagnosis not present

## 2021-04-12 DIAGNOSIS — R109 Unspecified abdominal pain: Secondary | ICD-10-CM | POA: Diagnosis not present

## 2021-04-12 DIAGNOSIS — I700 Atherosclerosis of aorta: Secondary | ICD-10-CM | POA: Diagnosis not present

## 2021-04-12 NOTE — Progress Notes (Signed)
No show. kc 

## 2021-04-13 ENCOUNTER — Encounter (HOSPITAL_COMMUNITY): Payer: Self-pay | Admitting: Emergency Medicine

## 2021-04-13 ENCOUNTER — Telehealth: Payer: Self-pay

## 2021-04-13 ENCOUNTER — Inpatient Hospital Stay (HOSPITAL_COMMUNITY)
Admission: EM | Admit: 2021-04-13 | Discharge: 2021-05-10 | DRG: 871 | Disposition: A | Discharge status: Home / Self Care | Payer: Medicare HMO | Attending: Internal Medicine | Admitting: Internal Medicine

## 2021-04-13 ENCOUNTER — Inpatient Hospital Stay (HOSPITAL_COMMUNITY): Payer: Medicare HMO

## 2021-04-13 DIAGNOSIS — A419 Sepsis, unspecified organism: Secondary | ICD-10-CM | POA: Diagnosis not present

## 2021-04-13 DIAGNOSIS — Z79899 Other long term (current) drug therapy: Secondary | ICD-10-CM

## 2021-04-13 DIAGNOSIS — K863 Pseudocyst of pancreas: Secondary | ICD-10-CM | POA: Diagnosis not present

## 2021-04-13 DIAGNOSIS — D638 Anemia in other chronic diseases classified elsewhere: Secondary | ICD-10-CM | POA: Diagnosis present

## 2021-04-13 DIAGNOSIS — K8592 Acute pancreatitis with infected necrosis, unspecified: Secondary | ICD-10-CM | POA: Diagnosis not present

## 2021-04-13 DIAGNOSIS — E785 Hyperlipidemia, unspecified: Secondary | ICD-10-CM | POA: Diagnosis present

## 2021-04-13 DIAGNOSIS — Z4659 Encounter for fitting and adjustment of other gastrointestinal appliance and device: Secondary | ICD-10-CM | POA: Diagnosis not present

## 2021-04-13 DIAGNOSIS — R791 Abnormal coagulation profile: Secondary | ICD-10-CM | POA: Diagnosis present

## 2021-04-13 DIAGNOSIS — E43 Unspecified severe protein-calorie malnutrition: Secondary | ICD-10-CM | POA: Diagnosis not present

## 2021-04-13 DIAGNOSIS — J449 Chronic obstructive pulmonary disease, unspecified: Secondary | ICD-10-CM | POA: Diagnosis present

## 2021-04-13 DIAGNOSIS — I314 Cardiac tamponade: Secondary | ICD-10-CM | POA: Diagnosis not present

## 2021-04-13 DIAGNOSIS — Z86718 Personal history of other venous thrombosis and embolism: Secondary | ICD-10-CM

## 2021-04-13 DIAGNOSIS — I119 Hypertensive heart disease without heart failure: Secondary | ICD-10-CM | POA: Diagnosis present

## 2021-04-13 DIAGNOSIS — Z978 Presence of other specified devices: Secondary | ICD-10-CM | POA: Diagnosis not present

## 2021-04-13 DIAGNOSIS — J410 Simple chronic bronchitis: Secondary | ICD-10-CM | POA: Diagnosis not present

## 2021-04-13 DIAGNOSIS — Z7902 Long term (current) use of antithrombotics/antiplatelets: Secondary | ICD-10-CM

## 2021-04-13 DIAGNOSIS — A4159 Other Gram-negative sepsis: Principal | ICD-10-CM | POA: Diagnosis present

## 2021-04-13 DIAGNOSIS — K651 Peritoneal abscess: Secondary | ICD-10-CM | POA: Diagnosis present

## 2021-04-13 DIAGNOSIS — Z86711 Personal history of pulmonary embolism: Secondary | ICD-10-CM

## 2021-04-13 DIAGNOSIS — J9611 Chronic respiratory failure with hypoxia: Secondary | ICD-10-CM

## 2021-04-13 DIAGNOSIS — Z8673 Personal history of transient ischemic attack (TIA), and cerebral infarction without residual deficits: Secondary | ICD-10-CM

## 2021-04-13 DIAGNOSIS — K5730 Diverticulosis of large intestine without perforation or abscess without bleeding: Secondary | ICD-10-CM | POA: Diagnosis not present

## 2021-04-13 DIAGNOSIS — I10 Essential (primary) hypertension: Secondary | ICD-10-CM | POA: Diagnosis not present

## 2021-04-13 DIAGNOSIS — F17200 Nicotine dependence, unspecified, uncomplicated: Secondary | ICD-10-CM | POA: Diagnosis present

## 2021-04-13 DIAGNOSIS — E162 Hypoglycemia, unspecified: Secondary | ICD-10-CM | POA: Diagnosis present

## 2021-04-13 DIAGNOSIS — I2510 Atherosclerotic heart disease of native coronary artery without angina pectoris: Secondary | ICD-10-CM | POA: Diagnosis present

## 2021-04-13 DIAGNOSIS — K8689 Other specified diseases of pancreas: Secondary | ICD-10-CM | POA: Diagnosis not present

## 2021-04-13 DIAGNOSIS — R570 Cardiogenic shock: Secondary | ICD-10-CM | POA: Diagnosis not present

## 2021-04-13 DIAGNOSIS — N179 Acute kidney failure, unspecified: Secondary | ICD-10-CM | POA: Diagnosis not present

## 2021-04-13 DIAGNOSIS — E669 Obesity, unspecified: Secondary | ICD-10-CM | POA: Diagnosis present

## 2021-04-13 DIAGNOSIS — Z20822 Contact with and (suspected) exposure to covid-19: Secondary | ICD-10-CM | POA: Diagnosis present

## 2021-04-13 DIAGNOSIS — E7800 Pure hypercholesterolemia, unspecified: Secondary | ICD-10-CM | POA: Diagnosis not present

## 2021-04-13 DIAGNOSIS — Z9049 Acquired absence of other specified parts of digestive tract: Secondary | ICD-10-CM

## 2021-04-13 DIAGNOSIS — Z9981 Dependence on supplemental oxygen: Secondary | ICD-10-CM

## 2021-04-13 DIAGNOSIS — I309 Acute pericarditis, unspecified: Secondary | ICD-10-CM | POA: Diagnosis not present

## 2021-04-13 DIAGNOSIS — E611 Iron deficiency: Secondary | ICD-10-CM | POA: Diagnosis present

## 2021-04-13 DIAGNOSIS — Z66 Do not resuscitate: Secondary | ICD-10-CM | POA: Diagnosis present

## 2021-04-13 DIAGNOSIS — K8591 Acute pancreatitis with uninfected necrosis, unspecified: Secondary | ICD-10-CM | POA: Diagnosis not present

## 2021-04-13 DIAGNOSIS — Z8616 Personal history of COVID-19: Secondary | ICD-10-CM | POA: Diagnosis not present

## 2021-04-13 DIAGNOSIS — R918 Other nonspecific abnormal finding of lung field: Secondary | ICD-10-CM | POA: Diagnosis not present

## 2021-04-13 DIAGNOSIS — Z1621 Resistance to vancomycin: Secondary | ICD-10-CM | POA: Diagnosis not present

## 2021-04-13 DIAGNOSIS — Z8249 Family history of ischemic heart disease and other diseases of the circulatory system: Secondary | ICD-10-CM

## 2021-04-13 DIAGNOSIS — I3139 Other pericardial effusion (noninflammatory): Secondary | ICD-10-CM | POA: Diagnosis not present

## 2021-04-13 DIAGNOSIS — Z6835 Body mass index (BMI) 35.0-35.9, adult: Secondary | ICD-10-CM

## 2021-04-13 DIAGNOSIS — Z4682 Encounter for fitting and adjustment of non-vascular catheter: Secondary | ICD-10-CM | POA: Diagnosis not present

## 2021-04-13 DIAGNOSIS — D649 Anemia, unspecified: Secondary | ICD-10-CM | POA: Diagnosis not present

## 2021-04-13 DIAGNOSIS — E782 Mixed hyperlipidemia: Secondary | ICD-10-CM | POA: Diagnosis present

## 2021-04-13 DIAGNOSIS — B952 Enterococcus as the cause of diseases classified elsewhere: Secondary | ICD-10-CM | POA: Diagnosis present

## 2021-04-13 DIAGNOSIS — K859 Acute pancreatitis without necrosis or infection, unspecified: Secondary | ICD-10-CM | POA: Diagnosis not present

## 2021-04-13 DIAGNOSIS — I959 Hypotension, unspecified: Secondary | ICD-10-CM | POA: Diagnosis not present

## 2021-04-13 DIAGNOSIS — Z880 Allergy status to penicillin: Secondary | ICD-10-CM

## 2021-04-13 DIAGNOSIS — E871 Hypo-osmolality and hyponatremia: Secondary | ICD-10-CM | POA: Diagnosis not present

## 2021-04-13 DIAGNOSIS — R0600 Dyspnea, unspecified: Secondary | ICD-10-CM

## 2021-04-13 DIAGNOSIS — I25118 Atherosclerotic heart disease of native coronary artery with other forms of angina pectoris: Secondary | ICD-10-CM | POA: Diagnosis present

## 2021-04-13 DIAGNOSIS — J90 Pleural effusion, not elsewhere classified: Secondary | ICD-10-CM | POA: Diagnosis not present

## 2021-04-13 DIAGNOSIS — R6520 Severe sepsis without septic shock: Secondary | ICD-10-CM | POA: Diagnosis present

## 2021-04-13 DIAGNOSIS — Z87891 Personal history of nicotine dependence: Secondary | ICD-10-CM

## 2021-04-13 DIAGNOSIS — Z7951 Long term (current) use of inhaled steroids: Secondary | ICD-10-CM

## 2021-04-13 DIAGNOSIS — L0291 Cutaneous abscess, unspecified: Secondary | ICD-10-CM | POA: Diagnosis not present

## 2021-04-13 DIAGNOSIS — R000 Tachycardia, unspecified: Secondary | ICD-10-CM | POA: Diagnosis not present

## 2021-04-13 DIAGNOSIS — Z823 Family history of stroke: Secondary | ICD-10-CM

## 2021-04-13 DIAGNOSIS — I319 Disease of pericardium, unspecified: Secondary | ICD-10-CM | POA: Diagnosis not present

## 2021-04-13 DIAGNOSIS — E876 Hypokalemia: Secondary | ICD-10-CM | POA: Diagnosis not present

## 2021-04-13 DIAGNOSIS — I517 Cardiomegaly: Secondary | ICD-10-CM | POA: Diagnosis not present

## 2021-04-13 DIAGNOSIS — Z7901 Long term (current) use of anticoagulants: Secondary | ICD-10-CM

## 2021-04-13 HISTORY — DX: Chronic respiratory failure with hypoxia: J96.11

## 2021-04-13 HISTORY — DX: Acute pancreatitis with uninfected necrosis, unspecified: K85.91

## 2021-04-13 HISTORY — DX: Sepsis, unspecified organism: A41.9

## 2021-04-13 HISTORY — DX: Hypokalemia: E87.6

## 2021-04-13 HISTORY — DX: Acute kidney failure, unspecified: N17.9

## 2021-04-13 LAB — LACTIC ACID, PLASMA
Lactic Acid, Venous: 1.4 mmol/L (ref 0.5–1.9)
Lactic Acid, Venous: 1.4 mmol/L (ref 0.5–1.9)

## 2021-04-13 LAB — CBC WITH DIFFERENTIAL/PLATELET
Abs Immature Granulocytes: 0.1 10*3/uL — ABNORMAL HIGH (ref 0.00–0.07)
Basophils Absolute: 0.1 10*3/uL (ref 0.0–0.1)
Basophils Relative: 1 %
Eosinophils Absolute: 0.1 10*3/uL (ref 0.0–0.5)
Eosinophils Relative: 1 %
HCT: 34.2 % — ABNORMAL LOW (ref 39.0–52.0)
Hemoglobin: 11.1 g/dL — ABNORMAL LOW (ref 13.0–17.0)
Immature Granulocytes: 1 %
Lymphocytes Relative: 12 %
Lymphs Abs: 2.2 10*3/uL (ref 0.7–4.0)
MCH: 27.9 pg (ref 26.0–34.0)
MCHC: 32.5 g/dL (ref 30.0–36.0)
MCV: 85.9 fL (ref 80.0–100.0)
Monocytes Absolute: 1.2 10*3/uL — ABNORMAL HIGH (ref 0.1–1.0)
Monocytes Relative: 7 %
Neutro Abs: 14.5 10*3/uL — ABNORMAL HIGH (ref 1.7–7.7)
Neutrophils Relative %: 78 %
Platelets: 651 10*3/uL — ABNORMAL HIGH (ref 150–400)
RBC: 3.98 MIL/uL — ABNORMAL LOW (ref 4.22–5.81)
RDW: 15.1 % (ref 11.5–15.5)
WBC: 18.2 10*3/uL — ABNORMAL HIGH (ref 4.0–10.5)
nRBC: 0 % (ref 0.0–0.2)

## 2021-04-13 LAB — BASIC METABOLIC PANEL
Anion gap: 14 (ref 5–15)
BUN: 22 mg/dL — ABNORMAL HIGH (ref 6–20)
CO2: 20 mmol/L — ABNORMAL LOW (ref 22–32)
Calcium: 7.9 mg/dL — ABNORMAL LOW (ref 8.9–10.3)
Chloride: 96 mmol/L — ABNORMAL LOW (ref 98–111)
Creatinine, Ser: 2.19 mg/dL — ABNORMAL HIGH (ref 0.61–1.24)
GFR, Estimated: 34 mL/min — ABNORMAL LOW (ref >60)
Glucose, Bld: 89 mg/dL (ref 70–99)
Potassium: 3.4 mmol/L — ABNORMAL LOW (ref 3.5–5.1)
Sodium: 130 mmol/L — ABNORMAL LOW (ref 135–145)

## 2021-04-13 LAB — MAGNESIUM: Magnesium: 1.6 mg/dL — ABNORMAL LOW (ref 1.7–2.4)

## 2021-04-13 LAB — COMPREHENSIVE METABOLIC PANEL
ALT: 35 U/L (ref 0–44)
AST: 28 U/L (ref 15–41)
Albumin: 2.4 g/dL — ABNORMAL LOW (ref 3.5–5.0)
Alkaline Phosphatase: 148 U/L — ABNORMAL HIGH (ref 38–126)
Anion gap: 15 (ref 5–15)
BUN: 22 mg/dL — ABNORMAL HIGH (ref 6–20)
CO2: 25 mmol/L (ref 22–32)
Calcium: 9.2 mg/dL (ref 8.9–10.3)
Chloride: 95 mmol/L — ABNORMAL LOW (ref 98–111)
Creatinine, Ser: 2.49 mg/dL — ABNORMAL HIGH (ref 0.61–1.24)
GFR, Estimated: 30 mL/min — ABNORMAL LOW (ref >60)
Glucose, Bld: 106 mg/dL — ABNORMAL HIGH (ref 70–99)
Potassium: 2.7 mmol/L — CL (ref 3.5–5.1)
Sodium: 135 mmol/L (ref 135–145)
Total Bilirubin: 0.4 mg/dL (ref 0.3–1.2)
Total Protein: 7.9 g/dL (ref 6.5–8.1)

## 2021-04-13 LAB — PROTIME-INR
INR: 2.4 — ABNORMAL HIGH (ref 0.8–1.2)
Prothrombin Time: 25.8 seconds — ABNORMAL HIGH (ref 11.4–15.2)

## 2021-04-13 LAB — LIPASE, BLOOD: Lipase: 36 U/L (ref 11–51)

## 2021-04-13 LAB — RESP PANEL BY RT-PCR (FLU A&B, COVID) ARPGX2
Influenza A by PCR: NEGATIVE
Influenza B by PCR: NEGATIVE
SARS Coronavirus 2 by RT PCR: NEGATIVE

## 2021-04-13 LAB — APTT: aPTT: 52 seconds — ABNORMAL HIGH (ref 24–36)

## 2021-04-13 MED ORDER — LACTATED RINGERS IV SOLN: INTRAVENOUS | Status: DC

## 2021-04-13 MED ORDER — MORPHINE SULFATE (PF) 2 MG/ML IV SOLN: 1.0000 mg | INTRAVENOUS | Status: DC | PRN

## 2021-04-13 MED ORDER — POTASSIUM CHLORIDE 10 MEQ/100ML IV SOLN
10.0000 meq | INTRAVENOUS | Status: AC
  Administered 2021-04-13 (×2): 10 meq via INTRAVENOUS
  Filled 2021-04-13 (×2): qty 100

## 2021-04-13 MED ORDER — ONDANSETRON HCL 4 MG PO TABS
4.0000 mg | ORAL_TABLET | Freq: Four times a day (QID) | ORAL | Status: DC | PRN
  Filled 2021-04-13: qty 1

## 2021-04-13 MED ORDER — PANTOPRAZOLE SODIUM 40 MG IV SOLR
40.0000 mg | Freq: Two times a day (BID) | INTRAVENOUS | Status: DC
  Administered 2021-04-13 – 2021-04-14 (×3): 40 mg via INTRAVENOUS
  Filled 2021-04-13 (×3): qty 40

## 2021-04-13 MED ORDER — SODIUM CHLORIDE 0.9 % IV SOLN
2.0000 g | Freq: Two times a day (BID) | INTRAVENOUS | Status: DC
  Filled 2021-04-13: qty 2

## 2021-04-13 MED ORDER — LACTATED RINGERS IV BOLUS (SEPSIS): 1000.0000 mL | Freq: Once | INTRAVENOUS | Status: DC

## 2021-04-13 MED ORDER — LACTATED RINGERS IV BOLUS (SEPSIS)
1000.0000 mL | Freq: Once | INTRAVENOUS | Status: AC
  Administered 2021-04-13: 1000 mL via INTRAVENOUS

## 2021-04-13 MED ORDER — SODIUM CHLORIDE 0.9 % IV SOLN
1.0000 g | Freq: Two times a day (BID) | INTRAVENOUS | Status: DC
  Administered 2021-04-13 – 2021-04-16 (×6): 1 g via INTRAVENOUS
  Filled 2021-04-13 (×8): qty 1

## 2021-04-13 MED ORDER — LEVOFLOXACIN IN D5W 750 MG/150ML IV SOLN: 750.0000 mg | Freq: Once | INTRAVENOUS | Status: DC

## 2021-04-13 MED ORDER — ONDANSETRON HCL 4 MG/2ML IJ SOLN
4.0000 mg | Freq: Four times a day (QID) | INTRAMUSCULAR | Status: DC | PRN
  Administered 2021-04-14 – 2021-04-21 (×8): 4 mg via INTRAVENOUS
  Filled 2021-04-13 (×8): qty 2

## 2021-04-13 MED ORDER — ACETAMINOPHEN 325 MG PO TABS
650.0000 mg | ORAL_TABLET | Freq: Four times a day (QID) | ORAL | Status: DC | PRN
  Administered 2021-04-15 – 2021-04-17 (×3): 650 mg via ORAL
  Filled 2021-04-13 (×4): qty 2

## 2021-04-13 MED ORDER — UMECLIDINIUM BROMIDE 62.5 MCG/ACT IN AEPB
1.0000 | INHALATION_SPRAY | Freq: Every day | RESPIRATORY_TRACT | Status: DC
  Administered 2021-04-15 – 2021-05-09 (×24): 1 via RESPIRATORY_TRACT
  Filled 2021-04-13 (×5): qty 7

## 2021-04-13 MED ORDER — ALBUTEROL SULFATE (2.5 MG/3ML) 0.083% IN NEBU: 2.5000 mg | INHALATION_SOLUTION | Freq: Four times a day (QID) | RESPIRATORY_TRACT | Status: DC | PRN

## 2021-04-13 MED ORDER — METRONIDAZOLE 500 MG/100ML IV SOLN
500.0000 mg | Freq: Once | INTRAVENOUS | Status: DC
  Administered 2021-04-13: 500 mg via INTRAVENOUS
  Filled 2021-04-13: qty 100

## 2021-04-13 MED ORDER — ACETAMINOPHEN 325 MG PO TABS
650.0000 mg | ORAL_TABLET | Freq: Once | ORAL | Status: AC
Start: 2021-04-13 — End: 2021-04-13
  Administered 2021-04-13: 650 mg via ORAL
  Filled 2021-04-13: qty 2

## 2021-04-13 MED ORDER — HEPARIN (PORCINE) 25000 UT/250ML-% IV SOLN
1700.0000 [IU]/h | INTRAVENOUS | Status: DC
  Administered 2021-04-13: 21:00:00 1350 [IU]/h via INTRAVENOUS
  Administered 2021-04-14: 1500 [IU]/h via INTRAVENOUS
  Administered 2021-04-15: 1650 [IU]/h via INTRAVENOUS
  Administered 2021-04-15: 1500 [IU]/h via INTRAVENOUS
  Administered 2021-04-16: 1650 [IU]/h via INTRAVENOUS
  Administered 2021-04-17 – 2021-04-18 (×2): 1700 [IU]/h via INTRAVENOUS
  Filled 2021-04-13 (×7): qty 250

## 2021-04-13 MED ORDER — FLUTICASONE FUROATE-VILANTEROL 100-25 MCG/ACT IN AEPB
1.0000 | INHALATION_SPRAY | Freq: Every day | RESPIRATORY_TRACT | Status: DC
  Administered 2021-04-15 – 2021-05-10 (×24): 1 via RESPIRATORY_TRACT
  Filled 2021-04-13 (×3): qty 28

## 2021-04-13 NOTE — Assessment & Plan Note (Addendum)
He was on Eliquis prior to admission.  This was placed on hold due to his abdominal issues and possible need for procedures.  He was transitioned to IV heparin.  Developed pericardial effusion.  Pericardiocentesis was done which showed that the fluid was bloody.  Anticoagulation has been discontinued for now.  Will likely need to hold going forward.   His episode of PE and DVT was in December of 2020.

## 2021-04-13 NOTE — Progress Notes (Signed)
Pharmacy Antibiotic Note  Scott Foley is a 56 y.o. male admitted on 04/13/2021 with sepsis 2/2 severe pancreatitis. Pharmacy has been consulted for Meropenem dosing.  Scr 2.49 (1.58 on 1/20)  Plan: Meropenem 1gm q12hr New worsening AKI, may need to adjust regimen further Will monitor for acute changes in renal function and adjust as needed F/u cultures results and de-escalate as appropriate    Temp (24hrs), Avg:100.2 F (37.9 C), Min:99.8 F (37.7 C), Max:100.8 F (38.2 C)  Recent Labs  Lab 04/13/21 1523  WBC 18.2*  CREATININE 2.49*  LATICACIDVEN 1.4    Estimated Creatinine Clearance: 32.5 mL/min (A) (by C-G formula based on SCr of 2.49 mg/dL (H)).    Allergies  Allergen Reactions   Penicillin G Hives    Thank you for allowing pharmacy to be a part of this patients care.  Donnald Garre, PharmD Clinical Pharmacist  Please check AMION for all [place] numbers After 10:00 PM, call [place] [phone]

## 2021-04-13 NOTE — Sepsis Progress Note (Signed)
eLink is following this Code Sepsis. °

## 2021-04-13 NOTE — Progress Notes (Signed)
Chronic Care Management Pharmacy Assistant   Name: Scott Foley  MRN: [id number] DOB: 02/25/1966   Reason for Encounter: Chart Prep for initial visit with CPP    Conditions to be addressed/monitored: CHF, CAD, HTN, HLD, and Tobacco Use, Pulmonary Embolus, COPD, Acquired Thrombophilia  Recent office visits:  04/01/21 Scott Belfast NP. Seen for Recurrent Acute Pancreatitis. Referral to Pulmonology and [facility] Coordination. Started Albuterol Sulfate 135mcg/act. Started Trelegy Ellipta 100-62.5-25mcg/act. Increased Isosorbide Mononitrate from 30 mg to 60 mg daily.   03/22/21 Scott Brome MD. Seen for Epigastric Pain. No med changes.   02/15/21 Scott Belfast NP. Seen for CAD. Started Pantoprazole Sodium 40 mg daily.  12/30/20 Scott Brome MD. Seen for HLD and HTN. Started on Repatha 140mg  every 14 days. Increased Varenicline Tartrate from 0.5mg  to 1 mg 2 times daily.  11/25/20 Scott Brome MD. Seen for COPD, HLD and HTN. Started Varenicline Tartrate 0.5mg . Change otc fish oil to lovaza 1 gm 2 caps twice a day   Recent consult visits:  02/15/21 (Cardiology) Scott Heinz MD. Seen for CAD. No med changes.   Hospital visits:  Medication Reconciliation was completed by comparing discharge summary, patients EMR and Pharmacy list, and upon discussion with patient.  Admitted to the hospital on 02/20/21 due to Abdominal Pain. Discharge date was 02/20/21. Discharged from [facility].    Medications remain the same after Hospital Discharge:??   Medication Reconciliation was completed by comparing discharge summary, patients EMR and Pharmacy list, and upon discussion with patient.  Admitted to the hospital on 02/09/21 due to Chest Pain. Discharge date was 02/10/21 Discharged from [facility].    Started Isosorbide Mononitrate 30mg  daily  Stopped Amlodipine 5 mg, Omega 3 1gm and varenicline 1 MG tablet  All other medications  remain the same after Hospital Discharge:??   Medication Reconciliation was completed by comparing discharge summary, patients EMR and Pharmacy list, and upon discussion with patient.  Admitted to the hospital on 10/25/20 due to Colonoscopy Propofol. Discharge date was 10/25/20 Discharged from [facility].    All medications remain the same after Hospital Discharge:??   Medications: Outpatient Encounter Medications as of 04/13/2021  Medication Sig   albuterol (VENTOLIN HFA) 108 (90 Base) MCG/ACT inhaler Inhale 2 puffs into the lungs every 6 (six) hours as needed for wheezing or shortness of breath.   apixaban (ELIQUIS) 5 MG TABS tablet Take 1 tablet (5 mg total) by mouth 2 (two) times daily.   clopidogrel (PLAVIX) 75 MG tablet Take 1 tablet (75 mg total) by mouth daily.   Evolocumab (REPATHA) 140 MG/ML SOSY Inject 140 mg into the skin every 14 (fourteen) days.   Fluticasone-Umeclidin-Vilant (TRELEGY ELLIPTA) 100-62.5-25 MCG/ACT AEPB Inhale 1 puff into the lungs daily.   isosorbide mononitrate (IMDUR) 60 MG 24 hr tablet Take 1 tablet (60 mg total) by mouth daily.   lisinopril (ZESTRIL) 40 MG tablet Take 1 tablet (40 mg total) by mouth daily.   metoprolol tartrate (LOPRESSOR) 50 MG tablet Take 1 tablet (50 mg total) by mouth 2 (two) times daily.   Omega-3 Fatty Acids (FISH OIL) 1000 MG CAPS Take 1 capsule (1,000 mg total) by mouth 2 (two) times daily.   pantoprazole (PROTONIX) 40 MG tablet Take 1 tablet (40 mg total) by mouth daily. (Patient taking differently: Take 40 mg by mouth 2 (two) times daily.)   rosuvastatin (CRESTOR) 40 MG tablet Take 1 tablet (40 mg total) by mouth daily.   No  facility-administered encounter medications on file as of 04/13/2021.    No results found for: HGBA1C, MICROALBUR   BP Readings from Last 3 Encounters:  04/01/21 128/82  02/20/21 (!) 167/96  02/15/21 (!) 152/90    Patient Questions:   Have you seen any other providers since your  last visit with PCP? Yes, Scott Foley, general surgeon that took his Gallbladder out  Scott Foley Hematologist he has an appt with, Scott Foley, Pulmonologist , and his Gastroenterologist.  Any changes in your medications or health? Yes, pt stated he has Pancreatitis and has Pulmonary issues and has to be on O2 2 liters.   Any side effects from any medications? Pt denied any side effects   Do you have an symptoms or problems not managed by your medications? Yes, pt stays tired and weak with no appetite.   Any concerns about your health right now? Yes, getting his appetite back and him not eating since having Covid.  Has your provider asked that you check blood pressure, blood sugar, or follow special diet at home? Yes, he is supposed to be on a bland diet, and provider wants him to start eating leafy vegetables. Pt wife stated he is supposed to take his BP readings before taking his BP medication.   Do you get any type of exercise on a regular basis? Yes, they try and walk around the house but pt is so weak and any exercise wipes him out.   Can you think of a goal you would like to reach for your health? Yes, wife wants him to get stronger and feel overall better. She wants him to start eating again like he use to.  Do you have any problems getting your medications? No  Is there anything that you would like to discuss during the appointment? They could not think of anything else.    Scott Foley was reminded to have all medications, supplements and any blood glucose and blood pressure readings available for review with Scott Foley, Pharm. D, at his telephone visit on 04/15/21 at 10:30am .    Star Rating Drugs:  Medication:  Last Fill: Day Supply Lisinopril   04/01/21 90ds    12/02/20 90ds Rosuvastatin   04/01/21 90ds    12/02/20 90ds  Care Gaps: Last annual wellness visit?08/19/19 Colonoscopy: 10/25/20 Last eye exam / retinopathy screening?N/A Last diabetic foot  exam?N/A  Scott Foley, [place] Pharmacist Assistant  [phone]

## 2021-04-13 NOTE — Progress Notes (Signed)
ANTICOAGULATION CONSULT NOTE - Initial Consult  Pharmacy Consult for heparin Indication: pulmonary embolus  Allergies  Allergen Reactions   Penicillin G Hives    Patient Measurements:   Heparin Dosing Weight: 84 kg   Vital Signs: Temp: 99.8 F (37.7 C) (02/01 1722) Temp Source: Oral (02/01 1722) BP: 99/66 (02/01 1900) Pulse Rate: 100 (02/01 1900)  Labs: Recent Labs    04/13/21 1523  HGB 11.1*  HCT 34.2*  PLT 651*  APTT 52*  LABPROT 25.8*  INR 2.4*  CREATININE 2.49*    Estimated Creatinine Clearance: 32.5 mL/min (A) (by C-G formula based on SCr of 2.49 mg/dL (H)).   Medical History: Past Medical History:  Diagnosis Date   Acquired thrombophilia ([place]) 12/06/2020   Chest pain 02/09/2021   Chronic obstructive pulmonary disease ([place]) 07/16/2018   Colon polyp 10/25/2020   Coronary artery disease due to lipid rich plaque 01/01/2021   Coronary artery disease with stable angina pectoris ([place])    AMI No heart cath, treated medically [facility]   DNR (do not resuscitate) 03/03/2019   Hyperlipemia    Hypertension    Hypertensive heart disease without congestive heart failure 12/06/2020   Morbid obesity ([place]) 12/06/2020   BMI 35 with serious comorbidities including CAD, HTN, STROKE, Hyperlipidemia.   Need for vaccination 12/15/2019   Pulmonary embolus ([place]) 03/03/2019   Pulmonary nodules/lesions, multiple 11/25/2012   9/15/2014CT Chest : small nodule RUL LUL. Stable since 03/2012 Smoking cessation advised and Rx chantix Repeat CT Chest 4 /21/15: subpleural lymph nodes, likely benign repeat one year 2016>>>no change, considered benign Arlyce Harman 11/25/2012  NORMAL   Sequela, post-stroke 07/28/2019   Tobacco use disorder 11/25/2012    Medications:  (Not in a hospital admission)   Assessment: 87 YOM who presented with sepsis on Eliquis at home for h/o PE. Pharmacy consulted to transition patient to IV heparin.   Last dose of Eliquis was at 8 AM today. H/H low, Plt elevated.  SCr elevated. Will monitor aPTT since Eliquis can elevated HL.   Goal of Therapy:  Heparin level 0.3-0.7 units/ml Monitor platelets by anticoagulation protocol: Yes   Plan:  -Start heparin infusion at 1350 units/hr -F/u 6 hr aPTT/HL -Monitor daily HL, aPTT, CBC and s/s of bleeding   Albertina Parr, PharmD., BCCCP Clinical Pharmacist Please refer to [facility] for unit-specific pharmacist

## 2021-04-13 NOTE — Progress Notes (Signed)
Pharmacy Antibiotic Note  Scott Foley is a 56 y.o. male admitted on 04/13/2021 with  intra-abdominal infection .  Pharmacy has been consulted for Cefepime dosing.  WBC elevated. SCr elevated   Plan: -Cefepime 2 gm IV Q 12 hours -Metronidazole per MD  -Monitor CBC, renal fx, cultures and clinical progress     Temp (24hrs), Avg:100.2 F (37.9 C), Min:99.8 F (37.7 C), Max:100.8 F (38.2 C)  Recent Labs  Lab 04/13/21 1523  WBC 18.2*  CREATININE 2.49*  LATICACIDVEN 1.4    Estimated Creatinine Clearance: 32.5 mL/min (A) (by C-G formula based on SCr of 2.49 mg/dL (H)).    Allergies  Allergen Reactions   Penicillin G Hives    Antimicrobials this admission: Cefepime 2/1 >>  Metronidazole 2/1 >>   Dose adjustments this admission:   Microbiology results: 2/1 BCx:    Thank you for allowing pharmacy to be a part of this patients care.  Albertina Parr, PharmD., BCCCP Clinical Pharmacist Please refer to [facility] for unit-specific pharmacist

## 2021-04-13 NOTE — H&P (Signed)
History and Physical    Patient: Scott Foley NOB:[id number] DOB: 04/12/1965 DOA: 04/13/2021 DOS: the patient was seen and examined on 04/13/2021 PCP: [name], MD  Patient coming from: Home - lives with wife and daughter    Chief Complaint: abdominal pain   HPI: Scott Foley is a 56 y.o. male with medical history significant of hx of CAD, HTN, hx of PE on eliquis, HLD, hxo of CVA in 2014, IDA with history of recent hospitalizaion on 03/16/21 for recurrent pancreatitis, sepsis due to bacteremia from klebsiella pneumoniae, covid-19 and respiratory failure requiring home oxygen on discharge. See below.  He presents today for four day history of diffuse worsening abdominal pain. States pain was 6/10 and described as sharp in nature. Pain is intermittent in nature. No radiation. Nothing makes it better or worse. He doesn't eat much food. He had some nausea and vomiting yesterday and states he had numerous episodes of vomiting, but none today. No diarrhea.  Pain has stayed about a 6/10 over the past 4 days. Yesterday he went to see his surgeon, Dr. Lilia Pro, who did a CT scan of his abdomen/pelvis. He called him today and told him to come to ED secondary to the abnormal findings on his CT. Concern for fluid around pancreas and infection.   Had OP cholecystectomy on 03/02/21 and underwent ERCP on 03/03/21. Developed pancreatitis s/p ERCP and treated conservatively at [facility] and was discharged on 03/09/21. He was readmitted to [facility] on 03/16/21 for recurrent pancreatitis and covid 19. He developed sepsis and respiratory failure. Blood cultures positive for klebsiella pneumoniae and he was treated with Lelon Huh for 10 days. Discharged on home oxygen at 2L.   No fever/chills, no headaches/vision changes, chest pain/palpitations, shortness of breath/cough, no dysuria, leg swelling, rashes or open cuts on skins.   He does not smoke (quit about 5 months ago), drink alcohol or  use illicit drugs.   ER Course:  vitals: 100.8, bp: 119/82, HR: 123, RR: 16, oxygen: 94%RA Pertinent labs: wbc: 18.2, hgb: 11.1, platelets: 651, lactic acid: 1.4, INR: 2.4, lipase wnl, potassium: 2.7, creatinine: 2.49, , blood cx: pending  CT abdo/pelvis: "Progressive severe pancreatitis with extensive fluid collections surrounding the pancreas and extending into the right retroperitoneum. Many of these fluid collections now contain gas concerning for infection. The pancreas appears to enhance normally. Compression of the splenic vein and SMV. These vessels appear to remain patent at this time"  IN ED given 1L bolus with continued IVF, flagyl, cefepime. Surgery consulted.    Review of Systems: As mentioned in the history of present illness. All other systems reviewed and are negative. Past Medical History:  Diagnosis Date   Acquired thrombophilia ([place]) 12/06/2020   Chest pain 02/09/2021   Chronic obstructive pulmonary disease ([place]) 07/16/2018   Colon polyp 10/25/2020   Coronary artery disease due to lipid rich plaque 01/01/2021   Coronary artery disease with stable angina pectoris ([place])    AMI No heart cath, treated medically [facility]   DNR (do not resuscitate) 03/03/2019   Hyperlipemia    Hypertension    Hypertensive heart disease without congestive heart failure 12/06/2020   Morbid obesity ([place]) 12/06/2020   BMI 35 with serious comorbidities including CAD, HTN, STROKE, Hyperlipidemia.   Need for vaccination 12/15/2019   Pulmonary embolus ([place]) 03/03/2019   Pulmonary nodules/lesions, multiple 11/25/2012   9/15/2014CT Chest : small nodule RUL LUL. Stable since 03/2012 Smoking cessation advised and Rx chantix Repeat CT Chest 4 /21/15:  subpleural lymph nodes, likely benign repeat one year 2016>>>no change, considered benign Arlyce Harman 11/25/2012  NORMAL   Sequela, post-stroke 07/28/2019   Tobacco use disorder 11/25/2012   Past Surgical History:  Procedure Laterality Date   COLONOSCOPY  WITH PROPOFOL N/A 10/25/2020   Procedure: COLONOSCOPY WITH PROPOFOL;  Surgeon: Jonathon Bellows, MD;  Location: [facility] ENDOSCOPY;  Service: Gastroenterology;  Laterality: N/A;   TONSILLECTOMY     Social History:  reports that he quit smoking about 5 months ago. His smoking use included cigarettes. He started smoking about 42 years ago. He has a 22.50 pack-year smoking history. He has never used smokeless tobacco. He reports that he does not drink alcohol and does not use drugs.  Allergies  Allergen Reactions   Penicillin G Hives    Family History  Problem Relation Age of Onset   Hypertension Father    Heart attack Father    Stroke Father    Prostate cancer Father    Hypertension Sister    Hypertension Brother    Heart attack Brother    Lung cancer Sister     Prior to Admission medications   Medication Sig Start Date End Date Taking? Authorizing Provider  albuterol (VENTOLIN HFA) 108 (90 Base) MCG/ACT inhaler Inhale 2 puffs into the lungs every 6 (six) hours as needed for wheezing or shortness of breath. 04/04/21   Rip Harbour, NP  apixaban (ELIQUIS) 5 MG TABS tablet Take 1 tablet (5 mg total) by mouth 2 (two) times daily. 04/01/21   Rip Harbour, NP  clopidogrel (PLAVIX) 75 MG tablet Take 1 tablet (75 mg total) by mouth daily. 04/01/21   Rip Harbour, NP  Evolocumab (REPATHA) 140 MG/ML SOSY Inject 140 mg into the skin every 14 (fourteen) days. 12/30/20   [name], Elnita Maxwell, MD  Fluticasone-Umeclidin-Vilant (TRELEGY ELLIPTA) 100-62.5-25 MCG/ACT AEPB Inhale 1 puff into the lungs daily. 04/04/21   Rip Harbour, NP  isosorbide mononitrate (IMDUR) 60 MG 24 hr tablet Take 1 tablet (60 mg total) by mouth daily. 04/01/21   Rip Harbour, NP  lisinopril (ZESTRIL) 40 MG tablet Take 1 tablet (40 mg total) by mouth daily. 04/01/21   Rip Harbour, NP  metoprolol tartrate (LOPRESSOR) 50 MG tablet Take 1 tablet (50 mg total) by mouth 2 (two) times daily. 04/01/21   Rip Harbour, NP   Omega-3 Fatty Acids (FISH OIL) 1000 MG CAPS Take 1 capsule (1,000 mg total) by mouth 2 (two) times daily. 11/30/20   [name], Elnita Maxwell, MD  pantoprazole (PROTONIX) 40 MG tablet Take 1 tablet (40 mg total) by mouth daily. Patient taking differently: Take 40 mg by mouth 2 (two) times daily. 02/15/21   Rip Harbour, NP  rosuvastatin (CRESTOR) 40 MG tablet Take 1 tablet (40 mg total) by mouth daily. 04/01/21   Rip Harbour, NP    Physical Exam: Vitals:   04/13/21 1730 04/13/21 1815 04/13/21 1830 04/13/21 1900  BP: 121/79 103/68 100/67 99/66  Pulse: (!) 115 (!) 108 (!) 102 100  Resp: (!) 33 (!) 27 (!) 26 (!) 27  Temp:      TempSrc:      SpO2: 95% 96% 96% 97%   General:  Appears calm and comfortable and is in NAD Eyes:  PERRL, EOMI, normal lids, iris ENT:  grossly normal hearing, lips & tongue, mmm; poor dentition  Neck:  no LAD, masses or thyromegaly; no carotid bruits Cardiovascular:  sinus tachycardia no m/r/g. No LE edema.  Respiratory:  CTA bilaterally with no wheezes/rales/rhonchi.  Normal respiratory effort. Abdomen:  soft, TTP bilateral upper quadrants (R>L), epigastric area. No rebound/guarding, ND, NABS Back:   normal alignment, no CVAT Skin:  no rash or induration seen on limited exam Musculoskeletal:  grossly normal tone BUE/BLE, good ROM, no bony abnormality Lower extremity:  No LE edema.  Limited foot exam with no ulcerations.  2+ distal pulses. Psychiatric:  grossly normal mood and affect, speech fluent and appropriate, AOx3 Neurologic:  CN 2-12 grossly intact, moves all extremities in coordinated fashion, sensation intact   Radiological Exams on Admission: Independently reviewed - see discussion in A/P where applicable  DG CHEST PORT 1 VIEW  Result Date: 04/13/2021 CLINICAL DATA:  Sepsis. EXAM: PORTABLE CHEST 1 VIEW COMPARISON:  Chest x-ray 03/27/2021. FINDINGS: The cardiomediastinal silhouette appears stable. There are patchy bilateral multifocal airspace opacities  in the mid and lower lungs, mildly increased. There is no pleural effusion or pneumothorax. No acute fractures are seen. IMPRESSION: 1. Bilateral multifocal patchy airspace disease has increased from prior examination. Findings are worrisome for infection. Electronically Signed   By: Ronney Asters M.D.   On: 04/13/2021 19:17    EKG: Independently reviewed.  Sinus tachycardia with rate 123; nonspecific ST changes with no evidence of acute ischemia   Labs on Admission: I have personally reviewed the available labs and imaging studies at the time of the admission.  Pertinent labs:    wbc: 18.2,  hgb: 11.1,  platelets: 651,  lactic acid: 1.4,  INR: 2.4,  lipase wnl,  potassium: 2.7,  creatinine: 2.49,    Assessment and Plan: Assessment and Plan: * Severe acute pancreatitis with sepsis - (present on admission) 56 year old male with history of pancreatitis s/p ERCP who developed recurrent pancreatitis on 03/16/21 with sepsis from bacteremia presenting with 4 day history of abdominal pain and CT abdomen findings of severe pancreatitis in setting of sepsis -admit to telemetry -sepsis criteria with tachycardia, fever to 100.8, WBC to 18.2. code sepsis activated -general surgery consulted and following, Dr. Zenia Resides -discussed plan with her, changing abx over to [facility] with his AKI, blood cultures pending -likley needs GI intervention and she will discuss with them -holding eliquis and starting heparin gtt -pain medication with IV morphine -protonix BID -zofran prn nausea/vomiting  -continue IVF and keep NPO  -check CXR/UA for other sources of infection as well.   AKI (acute kidney injury) ([place])- (present on admission) Likely prerenal in setting of vomiting episodes yesterday Also uses advil pm nightly (UA not resulted yet)  Continue IVF Strict I/O UA pending  Avoid nephrotoxic drugs  Follow bmp   Hypokalemia- (present on admission) Likely secondary to GI losses from vomiting Replaced  with 61meq in ED with AKI Checking mag and repeat bmp Telemetry Replete at needed   History of pulmonary embolism- (present on admission) Holding eliquis, starting heparin gtt in setting of procedure   Chronic respiratory failure with hypoxia ([place])- (present on admission) Continue oxygen 2L prn  Has been referred to pulm outpatient by pcp   Coronary artery disease with stable angina pectoris ([place])- (present on admission) Stable, no chest pain meds held with NPO/AKI/hypotensive status   Essential hypertension- (present on admission) Soft readings, hold home meds at this time  Hold ACE-I in setting of AKI   Hyperlipemia- (present on admission) Hold crestor with pancreatitis/AKI and NPO   Chronic obstructive pulmonary disease ([place])- (present on admission) No signs of exacerbation Continue trelegy daily and albuterol prn  Advance Care Planning:   Code Status: Full Code Full   Consults: general surgery: Dr. Zenia Resides   Family Communication: none   Severity of Illness: The appropriate patient status for this patient is INPATIENT. Inpatient status is judged to be reasonable and necessary in order to provide the required intensity of service to ensure the patient's safety. The patient's presenting symptoms, physical exam findings, and initial radiographic and laboratory data in the context of their chronic comorbidities is felt to place them at high risk for further clinical deterioration. Furthermore, it is not anticipated that the patient will be medically stable for discharge from the hospital within 2 midnights of admission.   * I certify that at the point of admission it is my clinical judgment that the patient will require inpatient hospital care spanning beyond 2 midnights from the point of admission due to high intensity of service, high risk for further deterioration and high frequency of surveillance required.*  Author: Orma Flaming, MD 04/13/2021 7:42 PM  For on call  review [email or web address].

## 2021-04-13 NOTE — Assessment & Plan Note (Addendum)
This is either a third episode of pancreatitis or sequelae of pancreatitis from his episode in January.  His lipase level was noted to be normal. CT scan was done at [facility] which apparently showed progressive severe pancreatitis with extensive fluid collection surrounding the pancreas and extending into the right retroperitoneum.  Many of these fluid collections contained gas concerning for infection.  Compression of splenic vein and SMV was noted although they appear to be patent.  Patient was started on meropenem.   Patient was seen by gastroenterology and general surgery.  Both of these services have signed off.   Interventional radiology consulted for percutaneous drainage which was done on 2/6 Patient was started on cortrak tube feedings.  Diet was slowly advanced.  Cortrak feeding tube was removed subsequently. CT scan of the abdomen pelvis was repeated on 2/16.   Evolving changes of necrotizing pancreatitis noted.  Mostly stable findings.  Fluid collection which was drained was noted to be decreased in size.  Wide patency of the superior mesenteric, splenic and portal veins were noted. ID recommends continuing meropenem. Drain to remain in place for now.  IR continues to follow intermittently. Pain is reasonably well controlled.

## 2021-04-13 NOTE — ED Triage Notes (Signed)
Patient here with complaint of right sided abdominal pain, patient states he had a CT scan yesterday that shows fluid around his pancreas. Patient alert, oriented, and in no apparent distress at this time.

## 2021-04-13 NOTE — Assessment & Plan Note (Addendum)
Stable.  Plavix currently on hold.

## 2021-04-13 NOTE — ED Provider Notes (Signed)
[facility] EMERGENCY DEPARTMENT Provider Note   CSN: [id number] Arrival date & time: 04/13/21  1415     History  Chief Complaint  Patient presents with   Abdominal Pain    Scott Foley is a 56 y.o. male who presents to the ED today for abnormal CT scan. Per chart review pt with 2 recent hospitalizations at [facility]. Initially underwent outpatient cholecystectomy with Dr. Lilia Pro on 12/21 -found to have abnormal intraoperative cholangiogram and underwent ERCP on 12/22.  Unfortunately developed pancreatitis status post ERCP and was treated conservatively/discharged on 12/28.  He was then readmitted with recurrent pancreatitis as well as COVID-19 on 1/04 where he developed sepsis and respiratory failure.  Blood cultures positive for Klebsiella pneumonia and treated with IV and IV for 10 days.  Patient reports about 4 days ago he developed diffuse abdominal pain.  He went to his PCP who ordered a CT scan with findings below.  He was called today and told he would need to go back to the hospital for admission.  Patient and wife did not want to go back to [place] and so they were advised to come to [facility] for further eval.   IMPRESSION: Progressive severe pancreatitis with extensive fluid collections surrounding the pancreas and extending into the right retroperitoneum. Many of these fluid collections now contain gas concerning for infection. The pancreas appears to enhance normally. Compression of the splenic vein and SMV. These vessels appear to remain patent at this time.   The history is provided by the patient and medical records.      Home Medications Prior to Admission medications   Medication Sig Start Date End Date Taking? Authorizing Provider  albuterol (VENTOLIN HFA) 108 (90 Base) MCG/ACT inhaler Inhale 2 puffs into the lungs every 6 (six) hours as needed for wheezing or shortness of breath. 04/04/21   Rip Harbour, NP  apixaban (ELIQUIS) 5 MG  TABS tablet Take 1 tablet (5 mg total) by mouth 2 (two) times daily. 04/01/21   Rip Harbour, NP  clopidogrel (PLAVIX) 75 MG tablet Take 1 tablet (75 mg total) by mouth daily. 04/01/21   Rip Harbour, NP  Evolocumab (REPATHA) 140 MG/ML SOSY Inject 140 mg into the skin every 14 (fourteen) days. 12/30/20   [name], Elnita Maxwell, MD  Fluticasone-Umeclidin-Vilant (TRELEGY ELLIPTA) 100-62.5-25 MCG/ACT AEPB Inhale 1 puff into the lungs daily. 04/04/21   Rip Harbour, NP  isosorbide mononitrate (IMDUR) 60 MG 24 hr tablet Take 1 tablet (60 mg total) by mouth daily. 04/01/21   Rip Harbour, NP  lisinopril (ZESTRIL) 40 MG tablet Take 1 tablet (40 mg total) by mouth daily. 04/01/21   Rip Harbour, NP  metoprolol tartrate (LOPRESSOR) 50 MG tablet Take 1 tablet (50 mg total) by mouth 2 (two) times daily. 04/01/21   Rip Harbour, NP  Omega-3 Fatty Acids (FISH OIL) 1000 MG CAPS Take 1 capsule (1,000 mg total) by mouth 2 (two) times daily. 11/30/20   [name], Elnita Maxwell, MD  pantoprazole (PROTONIX) 40 MG tablet Take 1 tablet (40 mg total) by mouth daily. Patient taking differently: Take 40 mg by mouth 2 (two) times daily. 02/15/21   Rip Harbour, NP  rosuvastatin (CRESTOR) 40 MG tablet Take 1 tablet (40 mg total) by mouth daily. 04/01/21   Rip Harbour, NP      Allergies    Penicillin g    Review of Systems   Review of Systems  Constitutional:  Positive for  appetite change and fever.  Gastrointestinal:  Positive for abdominal pain and nausea. Negative for constipation, diarrhea and vomiting.  All other systems reviewed and are negative.  Physical Exam Updated Vital Signs BP 126/78    Pulse (!) 118    Temp 99.8 F (37.7 C) (Oral)    Resp (!) 34    SpO2 96%  Physical Exam Vitals and nursing note reviewed.  Constitutional:      Appearance: He is not ill-appearing or diaphoretic.  HENT:     Head: Normocephalic and atraumatic.  Eyes:     Conjunctiva/sclera: Conjunctivae normal.   Cardiovascular:     Rate and Rhythm: Regular rhythm. Tachycardia present.  Pulmonary:     Effort: Pulmonary effort is normal.     Breath sounds: Normal breath sounds. No wheezing, rhonchi or rales.  Abdominal:     General: Abdomen is flat.     Palpations: Abdomen is soft.     Tenderness: There is generalized abdominal tenderness. There is no guarding or rebound.  Musculoskeletal:     Cervical back: Neck supple.  Skin:    General: Skin is warm and dry.  Neurological:     Mental Status: He is alert.    ED Results / Procedures / Treatments   Labs (all labs ordered are listed, but only abnormal results are displayed) Labs Reviewed  COMPREHENSIVE METABOLIC PANEL - Abnormal; Notable for the following components:      Result Value   Potassium 2.7 (*)    Chloride 95 (*)    Glucose, Bld 106 (*)    BUN 22 (*)    Creatinine, Ser 2.49 (*)    Albumin 2.4 (*)    Alkaline Phosphatase 148 (*)    GFR, Estimated 30 (*)    All other components within normal limits  CBC WITH DIFFERENTIAL/PLATELET - Abnormal; Notable for the following components:   WBC 18.2 (*)    RBC 3.98 (*)    Hemoglobin 11.1 (*)    HCT 34.2 (*)    Platelets 651 (*)    Neutro Abs 14.5 (*)    Monocytes Absolute 1.2 (*)    Abs Immature Granulocytes 0.10 (*)    All other components within normal limits  PROTIME-INR - Abnormal; Notable for the following components:   Prothrombin Time 25.8 (*)    INR 2.4 (*)    All other components within normal limits  APTT - Abnormal; Notable for the following components:   aPTT 52 (*)    All other components within normal limits  RESP PANEL BY RT-PCR (FLU A&B, COVID) ARPGX2  CULTURE, BLOOD (ROUTINE X 2)  CULTURE, BLOOD (ROUTINE X 2)  LACTIC ACID, PLASMA  LIPASE, BLOOD  LACTIC ACID, PLASMA  URINALYSIS, ROUTINE W REFLEX MICROSCOPIC  MAGNESIUM    EKG None  Radiology No results found.  Procedures .Critical Care Performed by: Eustaquio Maize, PA-C Authorized by: Eustaquio Maize, PA-C   Critical care provider statement:    Critical care time (minutes):  40   Critical care was necessary to treat or prevent imminent or life-threatening deterioration of the following conditions:  Sepsis   Critical care was time spent personally by me on the following activities:  Development of treatment plan with patient or surrogate, discussions with consultants, evaluation of patient's response to treatment, examination of patient, ordering and review of laboratory studies, ordering and review of radiographic studies, ordering and performing treatments and interventions, pulse oximetry, re-evaluation of patient's condition and review of old charts  Medications Ordered in ED Medications  lactated ringers infusion ( Intravenous New Bag/Given 04/13/21 1805)  lactated ringers bolus 1,000 mL (1,000 mLs Intravenous New Bag/Given 04/13/21 1811)  metroNIDAZOLE (FLAGYL) IVPB 500 mg (500 mg Intravenous New Bag/Given 04/13/21 1809)  potassium chloride 10 mEq in 100 mL IVPB (10 mEq Intravenous New Bag/Given 04/13/21 1807)  ceFEPIme (MAXIPIME) 2 g in sodium chloride 0.9 % 100 mL IVPB (has no administration in time range)  acetaminophen (TYLENOL) tablet 650 mg (650 mg Oral Given 04/13/21 1723)    ED Course/ Medical Decision Making/ A&P Clinical Course as of 04/13/21 1826  Wed Apr 13, 2021  1724 WBC(!): 18.2 [MV]  1724 Abs Immature Granulocytes(!): 0.10 [MV]  1724 Potassium(!!): 2.7 [MV]  1724 Creatinine(!): 2.49 [MV]    Clinical Course User Index [MV] Eustaquio Maize, PA-C                           Medical Decision Making 56 year old male who presents to the ED today with findings of progressive severe pancreatitis with fluid collection surrounding pancreas and right peritoneum with gas on CT scan.  Recent cholecystectomy with development of pancreatitis status post ERCP in December.  On arrival to the ED today patient was febrile at 100.8, tachycardic in the 120s.  Work-up started and he  was placed back in the waiting room.  CBC has returned with an elevated white blood cell count of 18,200 with left shift. Hgb stable at 11.1. Lactic acid WNL at 1.4. CMP with potassium 2.7. Creatinine elevated at 2.49 (1.58 12 days ago) with BUN 22. Lipase WNL at 36. COVID and flu negative.   Patient is brought back to room he continues to be tachycardic and febrile however repeat temperature 100.0 without any intervention.  Sepsis protocol started at this time with plans to consult general surgery and plan for admission.   Amount and/or Complexity of Data Reviewed Labs: ordered. Decision-making details documented in ED Course. ECG/medicine tests: ordered.  Risk OTC drugs. Prescription drug management. Decision regarding hospitalization. Risk Details: Discussed case with general surgeon Dr. Zenia Resides; she is aware of patient and received call from [facility] surgeon earlier today for more information. Recommends admission to medicine and sepsis protocol. General surgery to follow along.   Discussed case with [facility] Dr. Rogers Blocker who agrees to accept patient for admission.   Critical Care Total time providing critical care: 30-74 minutes         Final Clinical Impression(s) / ED Diagnoses Final diagnoses:  Sepsis with acute renal failure without septic shock, due to unspecified organism, unspecified acute renal failure type ([place])  Necrotizing pancreatitis  Hypokalemia    Rx / DC Orders ED Discharge Orders     None         Eustaquio Maize, PA-C 04/13/21 1826    Davonna Belling, MD 04/14/21 1422

## 2021-04-13 NOTE — Assessment & Plan Note (Signed)
>>  ASSESSMENT AND PLAN FOR HYPERLIPEMIA WRITTEN ON 04/17/2021 10:03 AM BY KRISHNAN, GOKUL, MD  Statin on hold currently.  Triglyceride was 121 in December.

## 2021-04-13 NOTE — Assessment & Plan Note (Addendum)
Sinus tachycardia  ACE inhibitor on hold due to borderline low blood pressures. He was on metoprolol with good control of heart rate.  But since his blood pressure was low over the weekend metoprolol was discontinued by cardiology.  Should be okay to resume it now since blood pressures have improved.

## 2021-04-13 NOTE — Assessment & Plan Note (Addendum)
Recently recovered from COVID-19 infection.  Was discharged on 2 L of oxygen by nasal cannula.  Admission chest x-ray showed opacities which could be from his recent infection rather than any infection.   Was noted to be more dyspneic yesterday.  X-ray was done which showed diffuse bilateral opacities seen previously as well.  Could reflect recovery from COVID-19.  Could also suggest pulmonary edema. Was given Lasix after transfusion.  He diuresed well. Remains on 2 L of oxygen by nasal cannula.

## 2021-04-13 NOTE — Assessment & Plan Note (Addendum)
Statin on hold currently.  Triglyceride was 121 in December.

## 2021-04-13 NOTE — Consult Note (Signed)
Scott Foley [birth year]  [id number].    Requesting MD: Dr. Rogers Blocker Chief Complaint/Reason for Consult: necrotizing pancreatitis  HPI:  Scott Foley is a 56 yo male who presented to the ED today with general malaise, nausea and vomiting. He had a laparoscopic cholecystectomy with IOC at the end of December in [place] by Dr. Lilia Pro, and IOC showed a CBD stone. He then underwent an ERCP and developed post-ERCP pancreatitis. His first scan on 1/4 showed hypoperfusion of the pancreas with significant edema. He was managed conservatively and ultimately discharged home, and had a postop visit yesterday. A CT was done at that time and showed pancreatic necrosis with gas within the pancreatic fluid collections. [facility]. He reports nausea and vomiting for the last few days, and says his appetite has been poor for the last few weeks. Labs are significant for an AKI and a WBC of 18.  ROS: Review of Systems  Constitutional:  Positive for chills, fever, malaise/fatigue and weight loss.  Respiratory:  Negative for shortness of breath and wheezing.   Gastrointestinal:  Positive for abdominal pain, nausea and vomiting.  Neurological:  Negative for seizures and weakness.   Family History  Problem Relation Age of Onset   Hypertension Father    Heart attack Father    Stroke Father    Prostate cancer Father    Hypertension Sister    Hypertension Brother    Heart attack Brother    Lung cancer Sister     Past Medical History:  Diagnosis Date   Acquired thrombophilia ([place]) 12/06/2020   Chest pain 02/09/2021   Chronic obstructive pulmonary disease ([place]) 07/16/2018   Colon polyp 10/25/2020   Coronary artery disease due to lipid rich plaque 01/01/2021   Coronary artery disease with stable angina pectoris ([place])    AMI No heart cath, treated medically [facility] [facility]   DNR (do not resuscitate) 03/03/2019   Hyperlipemia    Hypertension    Hypertensive heart disease without  congestive heart failure 12/06/2020   Morbid obesity ([place]) 12/06/2020   BMI 35 with serious comorbidities including CAD, HTN, STROKE, Hyperlipidemia.   Need for vaccination 12/15/2019   Pulmonary embolus ([place]) 03/03/2019   Pulmonary nodules/lesions, multiple 11/25/2012   9/15/2014CT Chest : small nodule RUL LUL. Stable since 03/2012 Smoking cessation advised and Rx chantix Repeat CT Chest 4 /21/15: subpleural lymph nodes, likely benign repeat one year 2016>>>no change, considered benign Arlyce Harman 11/25/2012  NORMAL   [place]ela, post-stroke 07/28/2019   Tobacco use disorder 11/25/2012    Past Surgical History:  Procedure Laterality Date   COLONOSCOPY WITH PROPOFOL N/A 10/25/2020   Procedure: COLONOSCOPY WITH PROPOFOL;  Surgeon: Jonathon Bellows, MD;  Location[facility] ENDOSCOPY;  Service: Gastroenterology;  Laterality: N/A;   TONSILLECTOMY      Social History:  reports that he quit smoking about 5 months ago. His smoking use included cigarettes. He started smoking about 42 years ago. He has a 22.50 pack-year smoking history. He has never used smokeless tobacco. He reports that he does not drink alcohol and does not use drugs.  Allergies:  Allergies  Allergen Reactions   Penicillin G Hives    (Not in a hospital admission)    Physical Exam: Blood pressure 100/67, pulse (!) 102, temperature 99.8 F (37.7 C), temperature source Oral, resp. rate (!) 26, SpO2 96 %. General: resting comfortably, appears stated age, no apparent distress Neurological: alert and oriented, no focal deficits, cranial nerves grossly in tact HEENT:  normocephalic, atraumatic, oropharynx clear, no scleral icterus CV: regular rate and rhythm, no murmurs, extremities warm and well-perfused Respiratory: normal work of breathing, symmetric chest wall expansion Abdomen: soft, nondistended, nontender to deep palpation. No masses or organomegaly. Extremities: warm and well-perfused, no deformities, moving all extremities  spontaneously Psychiatric: normal mood and affect Skin: warm and dry, no jaundice, no rashes or lesions   Results for orders placed or performed during the hospital encounter of 04/13/21 (from the past 48 hour(s))  Resp Panel by RT-PCR (Flu A&B, Covid) Nasopharyngeal Swab     Status: None   Collection Time: 04/13/21  3:22 PM   Specimen: Nasopharyngeal Swab; Nasopharyngeal(NP) swabs in vial transport medium  Result Value Ref Range   SARS Coronavirus 2 by RT PCR NEGATIVE NEGATIVE    Comment: (NOTE) SARS-CoV-2 target nucleic acids are NOT DETECTED.  The SARS-CoV-2 RNA is generally detectable in upper respiratory specimens during the acute phase of infection. The lowest concentration of SARS-CoV-2 viral copies this assay can detect is 138 copies/mL. A negative result does not preclude SARS-Cov-2 infection and should not be used as the sole basis for treatment or other patient management decisions. A negative result may occur with  improper specimen collection/handling, submission of specimen other than nasopharyngeal swab, presence of viral mutation(s) within the areas targeted by this assay, and inadequate number of viral copies(<138 copies/mL). A negative result must be combined with clinical observations, patient history, and epidemiological information. The expected result is Negative.  Fact Sheet for Patients:  EntrepreneurPulse.com.au  Fact Sheet for Healthcare Providers:  IncredibleEmployment.be  This test is no t yet approved or cleared by the [place] FDA and  has been authorized for detection and/or diagnosis of SARS-CoV-2 by FDA under an Emergency Use Authorization (EUA). This EUA will remain  in effect (meaning this test can be used) for the duration of the COVID-19 declaration under Section 564(b)(1) of the Act, 21 U.S.C.section 360bbb-3(b)(1), unless the authorization is terminated  or revoked sooner.       Influenza A by  PCR NEGATIVE NEGATIVE   Influenza B by PCR NEGATIVE NEGATIVE    Comment: (NOTE) The Xpert Xpress SARS-CoV-2/FLU/RSV plus assay is intended as an aid in the diagnosis of influenza from Nasopharyngeal swab specimens and should not be used as a sole basis for treatment. Nasal washings and aspirates are unacceptable for Xpert Xpress SARS-CoV-2/FLU/RSV testing.  Fact Sheet for Patients: EntrepreneurPulse.com.au  Fact Sheet for Healthcare Providers: IncredibleEmployment.be  This test is not yet approved or cleared by the [place] FDA and has been authorized for detection and/or diagnosis of SARS-CoV-2 by FDA under an Emergency Use Authorization (EUA). This EUA will remain in effect (meaning this test can be used) for the duration of the COVID-19 declaration under Section 564(b)(1) of the Act, 21 U.S.C. section 360bbb-3(b)(1), unless the authorization is terminated or revoked.  Performed at [facility], [place] [address] [place], [place] [address]   Lactic acid, plasma     Status: None   Collection Time: 04/13/21  3:23 PM  Result Value Ref Range   Lactic Acid, Venous 1.4 0.5 - 1.9 mmol/L    Comment: Performed at [facility] [address], [place], [place] [address]  Comprehensive metabolic panel     Status: Abnormal   Collection Time: 04/13/21  3:23 PM  Result Value Ref Range   Sodium 135 135 - 145 mmol/L   Potassium 2.7 (LL) 3.5 - 5.1 mmol/L    Comment: CRITICAL  RESULT CALLED TO, READ BACK BY AND VERIFIED WITH: GEIPLE J, RN ON 04/13/21 @ 1639 BY APPIAH D REPEATED TO VERIFY    Chloride 95 (L) 98 - 111 mmol/L   CO2 25 22 - 32 mmol/L   Glucose, Bld 106 (H) 70 - 99 mg/dL    Comment: Glucose reference range applies only to samples taken after fasting for at least 8 hours.   BUN 22 (H) 6 - 20 mg/dL   Creatinine, Ser 2.49 (H) 0.61 - 1.24 mg/dL   Calcium 9.2 8.9 - 10.3 mg/dL   Total Protein 7.9 6.5 - 8.1 g/dL   Albumin 2.4 (L) 3.5  - 5.0 g/dL   AST 28 15 - 41 U/L   ALT 35 0 - 44 U/L   Alkaline Phosphatase 148 (H) 38 - 126 U/L   Total Bilirubin 0.4 0.3 - 1.2 mg/dL   GFR, Estimated 30 (L) >60 mL/min    Comment: (NOTE) Calculated using the CKD-EPI Creatinine Equation (2021)    Anion gap 15 5 - 15    Comment: Performed at [facility], [place] [address], [place], [place] [address]  CBC WITH DIFFERENTIAL     Status: Abnormal   Collection Time: 04/13/21  3:23 PM  Result Value Ref Range   WBC 18.2 (H) 4.0 - 10.5 K/uL   RBC 3.98 (L) 4.22 - 5.81 MIL/uL   Hemoglobin 11.1 (L) 13.0 - 17.0 g/dL   HCT 34.2 (L) 39.0 - 52.0 %   MCV 85.9 80.0 - 100.0 fL   MCH 27.9 26.0 - 34.0 pg   MCHC 32.5 30.0 - 36.0 g/dL   RDW 15.1 11.5 - 15.5 %   Platelets 651 (H) 150 - 400 K/uL   nRBC 0.0 0.0 - 0.2 %   Neutrophils Relative % 78 %   Neutro Abs 14.5 (H) 1.7 - 7.7 K/uL   Lymphocytes Relative 12 %   Lymphs Abs 2.2 0.7 - 4.0 K/uL   Monocytes Relative 7 %   Monocytes Absolute 1.2 (H) 0.1 - 1.0 K/uL   Eosinophils Relative 1 %   Eosinophils Absolute 0.1 0.0 - 0.5 K/uL   Basophils Relative 1 %   Basophils Absolute 0.1 0.0 - 0.1 K/uL   Immature Granulocytes 1 %   Abs Immature Granulocytes 0.10 (H) 0.00 - 0.07 K/uL    Comment: Performed at [facility], [address], [place], [place] [address]  Protime-INR     Status: Abnormal   Collection Time: 04/13/21  3:23 PM  Result Value Ref Range   Prothrombin Time 25.8 (H) 11.4 - 15.2 seconds   INR 2.4 (H) 0.8 - 1.2    Comment: (NOTE) INR goal varies based on device and disease states. Performed at [facility], [place] [address], [place], [place] [address]   APTT     Status: Abnormal   Collection Time: 04/13/21  3:23 PM  Result Value Ref Range   aPTT 52 (H) 24 - 36 seconds    Comment:        IF BASELINE aPTT IS ELEVATED, SUGGEST PATIENT RISK ASSESSMENT BE USED TO DETERMINE APPROPRIATE ANTICOAGULANT THERAPY. Performed at [facility], [place] [address]  [place], [place] [address]   Lipase, blood     Status: None   Collection Time: 04/13/21  3:23 PM  Result Value Ref Range   Lipase 36 11 - 51 U/L    Comment: Performed at [facility], [place],  [place] [address]   No results found.    Assessment/Plan This is a 56 year old male with post-ERCP pancreatitis, now with signs of infected necrosis.  I reviewed his CT scan.  There is necrosis in the head and neck of the pancreas, with fluid tracking down the right retroperitoneum and gas within the fluid.  The SMV is very narrowed but the portal vein is patent.  Patient is approximately 1 month out from the start of symptoms. - IV antibiotics - Zosyn - Keep NPO with IV fluid hydration - Ok for heparin gtt, please hold eliquis - Will need GI consult. If endoscopic drainage of infected necrosis is not possible, may need percutaneous drainage. - No indication for acute surgical intervention - Surgery will follow   Scott Birks, MD [facility] Surgery General, Hepatobiliary and Pancreatic Surgery 04/13/21 7:04 PM

## 2021-04-13 NOTE — ED Provider Triage Note (Signed)
Emergency Medicine Provider Triage Evaluation Note  Hiroshi Krummel , a 56 y.o. male  was evaluated in triage.  Pt complains of abdominal pain, vomiting.  He has known history of gallstones.  He states that he had a CT performed yesterday performed by his doctor at [facility].  He was told to come to the emergency department for fluid around the pancreas.  CT 04/12/21 Impression:  IMPRESSION: Progressive severe pancreatitis with extensive fluid collections surrounding the pancreas and extending into the right retroperitoneum. Many of these fluid collections now contain gas concerning for infection. The pancreas appears to enhance normally. Compression of the splenic vein and SMV. These vessels appear to remain patent at this time.   Review of Systems  Positive: Abdominal pain, vomiting Negative: Chest pain  Physical Exam  BP 119/82 (BP Location: Right Arm)    Pulse (!) 123    Temp (!) 100.8 F (38.2 C) (Oral)    Resp 16    SpO2 94%  Gen:   Awake, no distress   Resp:  Normal effort  MSK:   Moves extremities without difficulty  Other:  Mild tenderness upper abdomen, no rebound or guarding  Medical Decision Making  Medically screening exam initiated at 3:18 PM.  Appropriate orders placed.  Joshaua Epple was informed that the remainder of the evaluation will be completed by another provider, this initial triage assessment does not replace that evaluation, and the importance of remaining in the ED until their evaluation is complete.     Carlisle Cater, PA-C 04/13/21 1521

## 2021-04-13 NOTE — Assessment & Plan Note (Addendum)
No signs of exacerbation. Continue trelegy daily and albuterol prn.

## 2021-04-13 NOTE — Assessment & Plan Note (Addendum)
Hypomagnesemia  Will give dose of potassium today.  Check magnesium tomorrow.

## 2021-04-13 NOTE — Assessment & Plan Note (Addendum)
Presented with creatinine of 2.49. This was likely prerenal in the setting of sepsis along with nausea and vomiting over the past few days.   Patient aggressively hydrated.  Renal function improved.  Sodium level noted to be low this morning.  Continue to monitor.

## 2021-04-14 ENCOUNTER — Encounter (HOSPITAL_COMMUNITY): Payer: Self-pay | Admitting: Family Medicine

## 2021-04-14 ENCOUNTER — Other Ambulatory Visit: Payer: Medicare HMO

## 2021-04-14 ENCOUNTER — Other Ambulatory Visit: Payer: Self-pay

## 2021-04-14 DIAGNOSIS — K8591 Acute pancreatitis with uninfected necrosis, unspecified: Secondary | ICD-10-CM | POA: Diagnosis not present

## 2021-04-14 DIAGNOSIS — N179 Acute kidney failure, unspecified: Secondary | ICD-10-CM | POA: Diagnosis not present

## 2021-04-14 DIAGNOSIS — A419 Sepsis, unspecified organism: Secondary | ICD-10-CM

## 2021-04-14 DIAGNOSIS — K8689 Other specified diseases of pancreas: Secondary | ICD-10-CM | POA: Diagnosis not present

## 2021-04-14 DIAGNOSIS — Z86711 Personal history of pulmonary embolism: Secondary | ICD-10-CM | POA: Diagnosis not present

## 2021-04-14 DIAGNOSIS — E876 Hypokalemia: Secondary | ICD-10-CM

## 2021-04-14 DIAGNOSIS — K859 Acute pancreatitis without necrosis or infection, unspecified: Secondary | ICD-10-CM

## 2021-04-14 DIAGNOSIS — R6520 Severe sepsis without septic shock: Secondary | ICD-10-CM

## 2021-04-14 LAB — URINALYSIS, MICROSCOPIC (REFLEX)

## 2021-04-14 LAB — COMPREHENSIVE METABOLIC PANEL
ALT: 25 U/L (ref 0–44)
AST: 20 U/L (ref 15–41)
Albumin: 2 g/dL — ABNORMAL LOW (ref 3.5–5.0)
Alkaline Phosphatase: 113 U/L (ref 38–126)
Anion gap: 13 (ref 5–15)
BUN: 21 mg/dL — ABNORMAL HIGH (ref 6–20)
CO2: 23 mmol/L (ref 22–32)
Calcium: 8.6 mg/dL — ABNORMAL LOW (ref 8.9–10.3)
Chloride: 99 mmol/L (ref 98–111)
Creatinine, Ser: 2.1 mg/dL — ABNORMAL HIGH (ref 0.61–1.24)
GFR, Estimated: 36 mL/min — ABNORMAL LOW (ref >60)
Glucose, Bld: 91 mg/dL (ref 70–99)
Potassium: 2.7 mmol/L — CL (ref 3.5–5.1)
Sodium: 135 mmol/L (ref 135–145)
Total Bilirubin: 0.3 mg/dL (ref 0.3–1.2)
Total Protein: 6.5 g/dL (ref 6.5–8.1)

## 2021-04-14 LAB — CBC
HCT: 28.7 % — ABNORMAL LOW (ref 39.0–52.0)
Hemoglobin: 9.3 g/dL — ABNORMAL LOW (ref 13.0–17.0)
MCH: 28.1 pg (ref 26.0–34.0)
MCHC: 32.4 g/dL (ref 30.0–36.0)
MCV: 86.7 fL (ref 80.0–100.0)
Platelets: 531 10*3/uL — ABNORMAL HIGH (ref 150–400)
RBC: 3.31 MIL/uL — ABNORMAL LOW (ref 4.22–5.81)
RDW: 15.3 % (ref 11.5–15.5)
WBC: 15.3 10*3/uL — ABNORMAL HIGH (ref 4.0–10.5)
nRBC: 0 % (ref 0.0–0.2)

## 2021-04-14 LAB — URINALYSIS, ROUTINE W REFLEX MICROSCOPIC
Bilirubin Urine: NEGATIVE
Glucose, UA: NEGATIVE mg/dL
Ketones, ur: NEGATIVE mg/dL
Leukocytes,Ua: NEGATIVE
Nitrite: NEGATIVE
Protein, ur: 100 mg/dL — AB
Specific Gravity, Urine: 1.025 (ref 1.005–1.030)
pH: 5.5 (ref 5.0–8.0)

## 2021-04-14 LAB — APTT
aPTT: 60 seconds — ABNORMAL HIGH (ref 24–36)
aPTT: 68 seconds — ABNORMAL HIGH (ref 24–36)
aPTT: 86 seconds — ABNORMAL HIGH (ref 24–36)

## 2021-04-14 LAB — HEPARIN LEVEL (UNFRACTIONATED)
Heparin Unfractionated: >1.1 IU/mL — ABNORMAL HIGH (ref 0.30–0.70)
Heparin Unfractionated: >1.1 IU/mL — ABNORMAL HIGH (ref 0.30–0.70)

## 2021-04-14 LAB — C-REACTIVE PROTEIN: CRP: 22.9 mg/dL — ABNORMAL HIGH (ref <1.0)

## 2021-04-14 MED ORDER — POTASSIUM CHLORIDE 10 MEQ/100ML IV SOLN
10.0000 meq | INTRAVENOUS | Status: AC
  Administered 2021-04-14 (×4): 10 meq via INTRAVENOUS
  Filled 2021-04-14 (×4): qty 100

## 2021-04-14 MED ORDER — MORPHINE SULFATE (PF) 2 MG/ML IV SOLN
2.0000 mg | INTRAVENOUS | Status: DC | PRN
  Administered 2021-04-22 – 2021-05-09 (×5): 2 mg via INTRAVENOUS
  Filled 2021-04-14 (×5): qty 1

## 2021-04-14 MED ORDER — OXYCODONE HCL 5 MG PO TABS
5.0000 mg | ORAL_TABLET | ORAL | Status: DC | PRN
  Administered 2021-04-14 – 2021-05-10 (×66): 5 mg via ORAL
  Filled 2021-04-14 (×68): qty 1

## 2021-04-14 NOTE — Progress Notes (Signed)
ANTICOAGULATION CONSULT NOTE - Follow Up Consult  Pharmacy Consult for heparin Indication:  h/o PE  Labs: Recent Labs    04/13/21 1523 04/13/21 1854 04/14/21 0230 04/14/21 0313  HGB 11.1*  --   --  9.3*  HCT 34.2*  --   --  28.7*  PLT 651*  --   --  531*  APTT 52*  --  60*  --   LABPROT 25.8*  --   --   --   INR 2.4*  --   --   --   HEPARINUNFRC  --   --  >1.10*  --   CREATININE 2.49* 2.19*  --   --     Assessment: 55yo male subtherapeutic on heparin with initial dosing while Eliquis on hold though may need more time to accumulate; no infusion issues or signs of bleeding per RN.  Goal of Therapy:  aPTT 66-102 seconds   Plan:  Will increase heparin infusion slightly to 1400 units/hr and check PTT in 8 hours.    Scott Foley, PharmD, BCPS  04/14/2021,4:05 AM

## 2021-04-14 NOTE — Progress Notes (Signed)
ANTICOAGULATION CONSULT NOTE - Follow Up Consult  Pharmacy Consult for heparin Indication:  h/o PE  Labs: Recent Labs    04/13/21 1523 04/13/21 1854 04/14/21 0230 04/14/21 0313 04/14/21 1130 04/14/21 2246  HGB 11.1*  --   --  9.3*  --   --   HCT 34.2*  --   --  28.7*  --   --   PLT 651*  --   --  531*  --   --   APTT 52*  --  60*  --  68* 86*  LABPROT 25.8*  --   --   --   --   --   INR 2.4*  --   --   --   --   --   HEPARINUNFRC  --   --  >1.10*  --   --  >1.10*  CREATININE 2.49* 2.19* 2.10*  --   --   --      Assessment: 56yo male subtherapeutic on heparin with initial dosing while Eliquis on hold.  PTT therapeutic   Goal of Therapy:  aPTT 66-102 seconds   Plan:  Continue heparin at 1500 units / hr Daily labs Monitor CBC and s/sx of bleeding daily  Thank you Anette Guarneri, PharmD 04/14/2021 11:22 PM

## 2021-04-14 NOTE — Progress Notes (Signed)
error 

## 2021-04-14 NOTE — Assessment & Plan Note (Addendum)
On admission he met sepsis criteria with tachycardia, fever to 100.8, WBC to 18.2.  Recently noted to have Klebsiella bacteremia earlier in January when he was hospitalized in [place].  Blood cultures done here in the hospital have been negative so far. Sepsis physiology improved.   Remains afebrile.  WBC slowly improving. Cultures from intra-abdominal fluid collection sent after aspiration 2/6- kleb/citrobacter Infectious disease continues to follow.  Remains on meropenem.

## 2021-04-14 NOTE — Plan of Care (Signed)

## 2021-04-14 NOTE — Progress Notes (Signed)
Patient ID: Scott Foley, male   DOB: 28-Jun-1965, 56 y.o.   MRN: [id number] [facility] Surgery Progress Note     Subjective: CC-  Feels about the same. Continues to have upper abdominal pain. Pain medication helps. Some nausea, no emesis.   Objective: Vital signs in last 24 hours: Temp:  [99.8 F (37.7 C)-100.8 F (38.2 C)] 99.8 F (37.7 C) (02/01 1722) Pulse Rate:  [94-123] 98 (02/02 1000) Resp:  [16-34] 27 (02/02 1000) BP: (99-143)/(65-90) 106/73 (02/02 1000) SpO2:  [90 %-99 %] 99 % (02/02 1000)    Intake/Output from previous day: 02/01 0701 - 02/02 0700 In: 100 [IV Piggyback:100] Out: -  Intake/Output this shift: No intake/output data recorded.  PE: Gen:  Alert, NAD, pleasant Card: mild tachy low 100s Pulm:  rate and effort normal Abd: Soft, ND, mild tenderness to deep palpation of the upper quadrants  Lab Results:  Recent Labs    04/13/21 1523 04/14/21 0313  WBC 18.2* 15.3*  HGB 11.1* 9.3*  HCT 34.2* 28.7*  PLT 651* 531*   BMET Recent Labs    04/13/21 1854 04/14/21 0230  NA 130* 135  K 3.4* 2.7*  CL 96* 99  CO2 20* 23  GLUCOSE 89 91  BUN 22* 21*  CREATININE 2.19* 2.10*  CALCIUM 7.9* 8.6*   PT/INR Recent Labs    04/13/21 1523  LABPROT 25.8*  INR 2.4*   CMP     Component Value Date/Time   NA 135 04/14/2021 0230   NA 140 04/01/2021 1047   K 2.7 (LL) 04/14/2021 0230   CL 99 04/14/2021 0230   CO2 23 04/14/2021 0230   GLUCOSE 91 04/14/2021 0230   BUN 21 (H) 04/14/2021 0230   BUN 24 04/01/2021 1047   CREATININE 2.10 (H) 04/14/2021 0230   CALCIUM 8.6 (L) 04/14/2021 0230   PROT 6.5 04/14/2021 0230   PROT 6.5 04/01/2021 1047   ALBUMIN 2.0 (L) 04/14/2021 0230   ALBUMIN 3.1 (L) 04/01/2021 1047   AST 20 04/14/2021 0230   ALT 25 04/14/2021 0230   ALKPHOS 113 04/14/2021 0230   BILITOT 0.3 04/14/2021 0230   BILITOT 0.6 04/01/2021 1047   GFRNONAA 36 (L) 04/14/2021 0230   GFRAA 105 12/15/2019 1034   Lipase     Component Value  Date/Time   LIPASE 36 04/13/2021 1523       Studies/Results: DG CHEST PORT 1 VIEW  Result Date: 04/13/2021 CLINICAL DATA:  Sepsis. EXAM: PORTABLE CHEST 1 VIEW COMPARISON:  Chest x-ray 03/27/2021. FINDINGS: The cardiomediastinal silhouette appears stable. There are patchy bilateral multifocal airspace opacities in the mid and lower lungs, mildly increased. There is no pleural effusion or pneumothorax. No acute fractures are seen. IMPRESSION: 1. Bilateral multifocal patchy airspace disease has increased from prior examination. Findings are worrisome for infection. Electronically Signed   By: Ronney Asters M.D.   On: 04/13/2021 19:17    Anti-infectives: Anti-infectives (From admission, onward)    Start     Dose/Rate Route Frequency Ordered Stop   04/13/21 2000  meropenem (MERREM) 1 g in sodium chloride 0.9 % 100 mL IVPB        1 g 200 mL/hr over 30 Minutes Intravenous Every 12 hours 04/13/21 1934     04/13/21 1800  ceFEPIme (MAXIPIME) 2 g in sodium chloride 0.9 % 100 mL IVPB  Status:  Discontinued        2 g 200 mL/hr over 30 Minutes Intravenous Every 12 hours 04/13/21 1731 04/13/21 1852  04/13/21 1715  levofloxacin (LEVAQUIN) IVPB 750 mg  Status:  Discontinued        750 mg 100 mL/hr over 90 Minutes Intravenous  Once 04/13/21 1713 04/13/21 1728   04/13/21 1715  metroNIDAZOLE (FLAGYL) IVPB 500 mg  Status:  Discontinued        500 mg 100 mL/hr over 60 Minutes Intravenous  Once 04/13/21 1713 04/13/21 1852        Assessment/Plan Post-ERCP pancreatitis, now with signs of infected necrosis  - s/p lap chole 03/02/21 by Dr. Lilia Pro with abnormal IOC >> underwent ERCP 03/03/21 - Await GI Dr. Rush Landmark recommendations regarding possibility of endoscopic drainage. If not possible may need IR assistance for drainage. Keep NPO for now. Continue IV antibiotics.   ID - merrem 2/1>> FEN - NPO VTE - hold plavix/ eliquis, on heparin gtt Foley - none  CAD HTN HLD COPD Hx PE ~3 years ago on  eliquis (last dose 2/1) Hx CVA in 2014 on plavix (last dose 2/1) IDA Tobacco abuse - quit smoking 5 months ago  Moderate Medical Decision Making   LOS: 1 day    Wellington Hampshire, [facility] Surgery 04/14/2021, 10:34 AM Please see Amion for pager number during day hours 7:00am-4:30pm

## 2021-04-14 NOTE — Progress Notes (Signed)
°  Transition of Care [facility]) Screening Note   Patient Details  Name: Scott Foley Date of Birth: [birth year]   Transition of [facility]) CM/SW Contact:    Sharin Mons, RN Phone Number: [phone] 04/14/2021, 3:49 PM   Presents with abd pain/ acute pancreatitis with sepsis, s/p lap chole 03/02/21 .From home with family. PCP: [name] Transition of Care Department [facility]) has reviewed patient and no TOC needs have been identified at this time. We will continue to monitor patient advancement through interdisciplinary progression rounds. If new patient transition needs arise, please place a TOC consult.  Whitman Hero RN,BSN,CM

## 2021-04-14 NOTE — Progress Notes (Signed)
ANTICOAGULATION CONSULT NOTE - Follow Up Consult  Pharmacy Consult for heparin Indication:  h/o PE  Labs: Recent Labs    04/13/21 1523 04/13/21 1854 04/14/21 0230 04/14/21 0313 04/14/21 1130  HGB 11.1*  --   --  9.3*  --   HCT 34.2*  --   --  28.7*  --   PLT 651*  --   --  531*  --   APTT 52*  --  60*  --  68*  LABPROT 25.8*  --   --   --   --   INR 2.4*  --   --   --   --   HEPARINUNFRC  --   --  >1.10*  --   --   CREATININE 2.49* 2.19* 2.10*  --   --      Assessment: 55yo male subtherapeutic on heparin with initial dosing while Eliquis on hold.  aPTT at the lower end of therapeutic. Hgb trending down but stable. Will slightly increase dose to ensure patient remains in a therapeutic range. No s/sx of bleeding noted.   Goal of Therapy:  aPTT 66-102 seconds   Plan:  Increase heparin infusion to 1500 units/hr Repeat aPTT and heparin level in 8 hours Monitor CBC and s/sx of bleeding daily  Joseph Art, Pharm.D. PGY-1 Pharmacy Resident [phone] 04/14/2021 12:49 PM

## 2021-04-14 NOTE — Hospital Course (Addendum)
57 y.o. male with medical history significant of hx of CAD, HTN, hx of PE on eliquis, HLD, hxo of CVA in 2014, IDA with history of recent hospitalizaion on 03/16/21 for recurrent pancreatitis, sepsis due to bacteremia from klebsiella pneumoniae, covid-19 and respiratory failure requiring home oxygen on discharge.  Presented with a 4-day history of diffuse worsening abdominal pain.  He has had abdominal issues for the past 2 months.  He had a cholecystectomy on 12/21 and underwent ERCP on 12/22 since the intraoperative cholangiogram showed a CBD stone.  After ERCP he developed pancreatitis.  He was managed conservatively and then discharged home on 12/28.  He was readmitted to [facility] on 1/4 for recurrent pancreatitis and COVID-19.  Noted to have sepsis respiratory failure.  Blood cultures were positive for Klebsiella pneumonia.  He was treated with Invanz for 10 days.  Discharged on 2 L of oxygen. He had a postop visit with surgeon on 04/12/21 when he complained of abdominal pain.  A CT scan was subsequently done which showed pancreatic necrosis with gas within the pancreatic fluid collections.  He was sent over to [facility] emergency department.  Interventional radiology was consulted for aspiration of the fluid collection which was performed on 2/6.   CT scan of the abdomen pelvis was repeated on 2/16.  Showed stable findings for the most part. Incidentally noted to have significant pericardial effusion with features concerning for tamponade.  Cardiology was consulted.  Patient underwent pericardiocentesis on 2/19. Transfused 1 unit of PRBC on 2/20.  Given Lasix after transfusion.

## 2021-04-14 NOTE — Progress Notes (Signed)
[facility] PROGRESS NOTE   Scott Foley FGH:[id number] DOB: 06/21/1965 DOA: 04/13/2021  1 DOS: the patient was seen and examined on 04/14/2021  PCP: [name], MD  Brief History and Hospital Course:  56 y.o. male with medical history significant of hx of CAD, HTN, hx of PE on eliquis, HLD, hxo of CVA in 2014, IDA with history of recent hospitalizaion on 03/16/21 for recurrent pancreatitis, sepsis due to bacteremia from klebsiella pneumoniae, covid-19 and respiratory failure requiring home oxygen on discharge.  Presented with a 4-day history of diffuse worsening abdominal pain.  He has had abdominal issues for the past 2 months.  He had a cholecystectomy on 12/21 and underwent ERCP on 12/22 since the intraoperative cholangiogram showed a CBD stone.  After ERCP he developed pancreatitis.  He was managed conservatively and then discharged home on 12/28.  He was readmitted to [facility] on 1/4 for recurrent pancreatitis and COVID-19.  Noted to have sepsis respiratory failure.  Blood cultures were positive for Klebsiella pneumonia.  He was treated with Invanz for 10 days.  Discharged on 2 L of oxygen. He had a postop visit with surgeon on 04/12/21 when he complained of abdominal pain.  A CT scan was subsequently done which showed pancreatic necrosis with gas within the pancreatic fluid collections.  He was sent over to [facility] emergency department.  He was seen by general surgery.  Hospitalized for further management.  Placed on antibiotics.  Also noted to have acute kidney injury.  Denied any significant alcohol use.  Consultants: General surgery.  We will consult gastroenterology.  Procedures: None    Subjective: Complains of 4 oh 4 out of 10 abdominal pain in the upper abdomen.  Some nausea but no vomiting.  No chest pain or shortness of breath.  Has urinated overnight.    Assessment/Plan:   * Severe acute pancreatitis with sepsis - (present on admission) This is  either a third episode of pancreatitis or sequelae of pancreatitis from his episode in January.  His lipase level is noted to be normal.  He does have significant leukocytosis.  CT scan was done at [facility].  Do not have access to that report but according to the H&P it showed progressive severe pancreatitis with extensive fluid collection surrounding the pancreas and extending into the right retroperitoneum.  Many of these fluid collections containing gas concerning for infection.  Compression of splenic vein and SMV was noted although they appear to be patent.  General surgery is following.  Started on meropenem.  We will consult gastroenterology to see if endoscopic drainage of the infected necrosis is a possibility.  If not patient may need to be seen by interventional radiology for percutaneous drainage.  Sepsis ([place])- (present on admission) Sepsis criteria with tachycardia, fever to 100.8, WBC to 18.2.  Recently noted to have Klebsiella bacteremia earlier in January when he was hospitalized in [place].  Follow-up on blood cultures.  Hypokalemia- (present on admission) Likely secondary to GI losses from vomiting Being repleted aggressively.  Magnesium was 1.6 yesterday.  We will recheck tomorrow.  AKI (acute kidney injury) ([place])- (present on admission) This is likely prerenal in the setting of sepsis along with nausea and vomiting over the past few days.  Patient getting aggressively hydrated.  Renal function noted to be slightly better today.  Monitor urine output.  UA reviewed.  Avoid nephrotoxic agents.    History of pulmonary embolism- (present on admission) Eliquis is currently on hold.  He is  on heparin infusion.    Chronic respiratory failure with hypoxia ([place])- (present on admission) Recovered from recent COVID-19 infection.  Was discharged on 2 L of oxygen by nasal cannula.  Chest x-ray continues to show opacities which could be from his recent infection rather than any infection.   Continue to monitor.    Chronic obstructive pulmonary disease ([place])- (present on admission) No signs of exacerbation. Continue trelegy daily and albuterol prn   Essential hypertension- (present on admission) Blood pressures lower range of normal.  Antihypertensive including ACE inhibitor on hold.  Hyperlipemia- (present on admission) Hold crestor with pancreatitis/AKI and NPO.  Triglyceride was 121 in December.  Coronary artery disease with stable angina pectoris ([place])- (present on admission) Stable.    Obesity Estimated body mass index is 32.1 kg/m as calculated from the following:   Height as of 04/01/21: 5\' 4"  (1.626 m).   Weight as of 04/01/21: 84.8 kg.    DVT Prophylaxis: On IV heparin Code Status: Full code Family Communication: Discussed with patient Disposition Plan: To be determined  Status is: Inpatient Remains inpatient appropriate because: Sepsis with pancreatic necrosis with infection             Medications: Scheduled:  fluticasone furoate-vilanterol  1 puff Inhalation Daily   And   umeclidinium bromide  1 puff Inhalation Daily   pantoprazole (PROTONIX) IV  40 mg Intravenous Q12H   Continuous:  heparin 1,400 Units/hr (04/14/21 0405)   lactated ringers 125 mL/hr at 04/13/21 2100   meropenem (MERREM) IV Stopped (04/14/21 0901)   potassium chloride 10 mEq (04/14/21 0933)   CLE:[id number], albuterol, morphine injection, ondansetron **OR** ondansetron (ZOFRAN) IV  Antibiotics: Anti-infectives (From admission, onward)    Start     Dose/Rate Route Frequency Ordered Stop   04/13/21 2000  meropenem (MERREM) 1 g in sodium chloride 0.9 % 100 mL IVPB        1 g 200 mL/hr over 30 Minutes Intravenous Every 12 hours 04/13/21 1934     04/13/21 1800  ceFEPIme (MAXIPIME) 2 g in sodium chloride 0.9 % 100 mL IVPB  Status:  Discontinued        2 g 200 mL/hr over 30 Minutes Intravenous Every 12 hours 04/13/21 1731 04/13/21 1852   04/13/21 1715  levofloxacin  (LEVAQUIN) IVPB 750 mg  Status:  Discontinued        750 mg 100 mL/hr over 90 Minutes Intravenous  Once 04/13/21 1713 04/13/21 1728   04/13/21 1715  metroNIDAZOLE (FLAGYL) IVPB 500 mg  Status:  Discontinued        500 mg 100 mL/hr over 60 Minutes Intravenous  Once 04/13/21 1713 04/13/21 1852       Objective:  Vital Signs  Vitals:   04/14/21 0245 04/14/21 0330 04/14/21 0700 04/14/21 0915  BP: (!) 143/89 124/77 116/72 107/74  Pulse: (!) 115 (!) 112 (!) 105 99  Resp: (!) 27 (!) 29 (!) 29 (!) 25  Temp:      TempSrc:      SpO2: 93% 90% 97% 96%    Intake/Output Summary (Last 24 hours) at 04/14/2021 6759 Last data filed at 04/13/2021 1902 Gross per 24 hour  Intake 100 ml  Output --  Net 100 ml   There were no vitals filed for this visit.  General appearance: Awake alert.  In no distress Resp: Clear to auscultation bilaterally.  Normal effort Cardio: S1-S2 is normal regular.  No S3-S4.  No rubs murmurs or bruit GI: Abdomen is soft.  Tender in the upper abdomen without any rebound rigidity or guarding.  Bowel sounds present but sluggish.  No masses organomegaly. Extremities: No edema.  Full range of motion of lower extremities. Neurologic: Alert and oriented x3.  No focal neurological deficits.    Lab Results:  Data Reviewed: I have personally reviewed labs and imaging study reports  CBC: Recent Labs  Lab 04/13/21 1523 04/14/21 0313  WBC 18.2* 15.3*  NEUTROABS 14.5*  --   HGB 11.1* 9.3*  HCT 34.2* 28.7*  MCV 85.9 86.7  PLT 651* 531*    Basic Metabolic Panel: Recent Labs  Lab 04/13/21 1523 04/13/21 1854 04/14/21 0230  NA 135 130* 135  K 2.7* 3.4* 2.7*  CL 95* 96* 99  CO2 25 20* 23  GLUCOSE 106* 89 91  BUN 22* 22* 21*  CREATININE 2.49* 2.19* 2.10*  CALCIUM 9.2 7.9* 8.6*  MG  --  1.6*  --     GFR: Estimated Creatinine Clearance: 38.6 mL/min (A) (by C-G formula based on SCr of 2.1 mg/dL (H)).  Liver Function Tests: Recent Labs  Lab 04/13/21 1523  04/14/21 0230  AST 28 20  ALT 35 25  ALKPHOS 148* 113  BILITOT 0.4 0.3  PROT 7.9 6.5  ALBUMIN 2.4* 2.0*    Recent Labs  Lab 04/13/21 1523  LIPASE 36    Coagulation Profile: Recent Labs  Lab 04/13/21 1523  INR 2.4*     Recent Results (from the past 240 hour(s))  Resp Panel by RT-PCR (Flu A&B, Covid) Nasopharyngeal Swab     Status: None   Collection Time: 04/13/21  3:22 PM   Specimen: Nasopharyngeal Swab; Nasopharyngeal(NP) swabs in vial transport medium  Result Value Ref Range Status   SARS Coronavirus 2 by RT PCR NEGATIVE NEGATIVE Final    Comment: (NOTE) SARS-CoV-2 target nucleic acids are NOT DETECTED.  The SARS-CoV-2 RNA is generally detectable in upper respiratory specimens during the acute phase of infection. The lowest concentration of SARS-CoV-2 viral copies this assay can detect is 138 copies/mL. A negative result does not preclude SARS-Cov-2 infection and should not be used as the sole basis for treatment or other patient management decisions. A negative result may occur with  improper specimen collection/handling, submission of specimen other than nasopharyngeal swab, presence of viral mutation(s) within the areas targeted by this assay, and inadequate number of viral copies(<138 copies/mL). A negative result must be combined with clinical observations, patient history, and epidemiological information. The expected result is Negative.  Fact Sheet for Patients:  EntrepreneurPulse.com.au  Fact Sheet for Healthcare Providers:  IncredibleEmployment.be  This test is no t yet approved or cleared by the [place] FDA and  has been authorized for detection and/or diagnosis of SARS-CoV-2 by FDA under an Emergency Use Authorization (EUA). This EUA will remain  in effect (meaning this test can be used) for the duration of the COVID-19 declaration under Section 564(b)(1) of the Act, 21 U.S.C.section 360bbb-3(b)(1), unless  the authorization is terminated  or revoked sooner.       Influenza A by PCR NEGATIVE NEGATIVE Final   Influenza B by PCR NEGATIVE NEGATIVE Final    Comment: (NOTE) The Xpert Xpress SARS-CoV-2/FLU/RSV plus assay is intended as an aid in the diagnosis of influenza from Nasopharyngeal swab specimens and should not be used as a sole basis for treatment. Nasal washings and aspirates are unacceptable for Xpert Xpress SARS-CoV-2/FLU/RSV testing.  Fact Sheet for Patients: EntrepreneurPulse.com.au  Fact Sheet for Healthcare Providers: IncredibleEmployment.be  This test  is not yet approved or cleared by the [place] and has been authorized for detection and/or diagnosis of SARS-CoV-2 by FDA under an Emergency Use Authorization (EUA). This EUA will remain in effect (meaning this test can be used) for the duration of the COVID-19 declaration under Section 564(b)(1) of the Act, 21 U.S.C. section 360bbb-3(b)(1), unless the authorization is terminated or revoked.  Performed at [facility], [place] [address], [place], [place] [address]       Radiology Studies: DG CHEST PORT 1 VIEW  Result Date: 04/13/2021 CLINICAL DATA:  Sepsis. EXAM: PORTABLE CHEST 1 VIEW COMPARISON:  Chest x-ray 03/27/2021. FINDINGS: The cardiomediastinal silhouette appears stable. There are patchy bilateral multifocal airspace opacities in the mid and lower lungs, mildly increased. There is no pleural effusion or pneumothorax. No acute fractures are seen. IMPRESSION: 1. Bilateral multifocal patchy airspace disease has increased from prior examination. Findings are worrisome for infection. Electronically Signed   By: Ronney Asters M.D.   On: 04/13/2021 19:17       LOS: 1 day   [facility] [facility] Pager on [email or web address]  04/14/2021, 9:43 AM

## 2021-04-14 NOTE — Consult Note (Addendum)
Consultation  Referring Provider:   ER Primary Care Physician:  [name], MD Primary Gastroenterologist:  Dr. Jonathon Bellows [facility] Reason for Consultation:     Complicated ERCP pancreatitis   Impression    Post ERCP complicated pancreatitis "signs of infection and necrosis. Status post lap chole 12/21, ERCP 12/22 with subsequent ERCP pancreatitis LIPASE 36 WBC 15.3 HGB 9.3 MCV 86.7 Platelets 531 BUN 21 Cr 2.10  GFR 36   Leukocytosis/ sepsis Lactic acid normal 02/01  Hypercoagulability INR 2.4, will have to consider vitamin K versus FFP prior to procedures Check daily Last AB [place] 02/18/21 with diffuse and increased echogenicity, no overt cirrhosis  Recurrent PE On Eliquis, last dose 2/1 Transition to heparin by primary team.  History of CVA 2014 On Plavix, last dose 02/01.  Chronic respiratory failure with hypoxia Since COVID-pneumonia, likely component of COPD with smoking history On 2 L [employer]  Coronary artery disease with stable angina Continue medications, no chest pain at this time.  AKI/hypokalemia IV fluids, replete, monitor. Tract I and Os Per nurse decrease urine output   Plan   ESR/CRP on day 1 & day 3 Daily CMET, CBC Check INR daily Strict I&O's, get bladder scan  Patient's last dose of Plavix and Eliquis was yesterday, will need 5-day Plavix washout prior to any endoscopic procedures.  Discussed with Dr. Rush Landmark who evaluated CT, does not think it is amendable to endoscopic evaluation- can consider IR assistance.   - Suggest aggressive fluid management with current AKI -Pain control per the inpatient medical team. -Encourage early ambulation -Advance diet as tolerated. If mild pancreatitis can advance to low fat diet.  -If the patient not tolerating an oral diet at 72 hours with significant history of a month of decreased p.o. intake and weight loss would recommend consideration of nutritional support with a nasogastric or nasojejunal  postpyloric feedings.  Dr. Lorenso Courier will see patient the patient and give further recommendations.  Thank you for your kind consultation, we will continue to follow.         HPI:   Scott Foley is a 56 y.o. male with past medical history significant for with history of hypertension, hyperlipidemia, coronary artery disease stable angina 2012, history of CVA on Plavix last dose yesterday, history of 2 unprovoked PEs on Eliquis last dose yesterday. Patient has unfortunate complicated recent history since cholecystectomy in December and Astepro by Dr. Lilia Pro, had [place] that showed retained stone in CBD, had ERCP on 03/03/2021 developed with ERCP pancreatitis discharged 03/09/2021. Patient readmitted 03/16/2021 developed sepsis and respiratory failure due to COVID-19, blood cultures positive for Klebsiella pneumonia, treated with Invanz IV for 10 days discharged home with 2 L O2.  Diagnosed with COPD and hypoxia. Patient had postop visit yesterday and had CT done that showed pancreatic necrosis with gas within the pancreatic fluid collection so [facility].  Patient states he was in his normal state of health until 4 to 5 days ago when he began to have upper epigastric pain, radiation to his back. Had 1 episode of vomiting food, no blood 3 days ago. Patient's had fever and chills here but none prior. Patient has had decreased intake for the past week, states food has poor taste. Patient's had a weight loss of 210-178 since December. Last bowel movement was yesterday, states this was soft denies melena or hematochezia. Patient denies any reflux, denies alcohol..  Colonoscopy with propofol 08/25/2020   Past Medical History:  Diagnosis Date   Acquired thrombophilia ([place]) 12/06/2020   Chest pain 02/09/2021   Chronic obstructive pulmonary disease ([place]) 07/16/2018   Colon polyp 10/25/2020   Coronary artery disease due to lipid rich plaque 01/01/2021   Coronary artery disease  with stable angina pectoris ([place])    AMI No heart cath, treated medically [place] [facility]   DNR (do not resuscitate) 03/03/2019   Hyperlipemia    Hypertension    Hypertensive heart disease without congestive heart failure 12/06/2020   Morbid obesity ([place]) 12/06/2020   BMI 35 with serious comorbidities including CAD, HTN, STROKE, Hyperlipidemia.   Need for vaccination 12/15/2019   Pulmonary embolus ([place]) 03/03/2019   Pulmonary nodules/lesions, multiple 11/25/2012   9/15/2014CT Chest : small nodule RUL LUL. Stable since 03/2012 Smoking cessation advised and Rx chantix Repeat CT Chest 4 /21/15: subpleural lymph nodes, likely benign repeat one year 2016>>>no change, considered benign Arlyce Harman 11/25/2012  NORMAL   Sequela, post-stroke 07/28/2019   Tobacco use disorder 11/25/2012    Surgical History:  He  has a past surgical history that includes Tonsillectomy and Colonoscopy with propofol (N/A, 10/25/2020). Family History:  His family history includes Heart attack in his brother and father; Hypertension in his brother, father, and sister; Lung cancer in his sister; Prostate cancer in his father; Stroke in his father. Social History:   reports that he quit smoking about 5 months ago. His smoking use included cigarettes. He started smoking about 42 years ago. He has a 22.50 pack-year smoking history. He has never used smokeless tobacco. He reports that he does not drink alcohol and does not use drugs.  Prior to Admission medications   Medication Sig Start Date End Date Taking? Authorizing Provider  albuterol (VENTOLIN HFA) 108 (90 Base) MCG/ACT inhaler Inhale 2 puffs into the lungs every 6 (six) hours as needed for wheezing or shortness of breath. 04/04/21  Yes Rip Harbour, NP  apixaban (ELIQUIS) 5 MG TABS tablet Take 1 tablet (5 mg total) by mouth 2 (two) times daily. 04/01/21  Yes Rip Harbour, NP  clopidogrel (PLAVIX) 75 MG tablet Take 1 tablet (75 mg total) by mouth daily. 04/01/21  Yes Rip Harbour, NP  Evolocumab (REPATHA) 140 MG/ML SOSY Inject 140 mg into the skin every 14 (fourteen) days. 12/30/20  Yes [name], [name], MD  Fluticasone-Umeclidin-Vilant (TRELEGY ELLIPTA) 100-62.5-25 MCG/ACT AEPB Inhale 1 puff into the lungs daily. 04/04/21  Yes Rip Harbour, NP  Ibuprofen-diphenhydrAMINE Cit (ADVIL PM PO) Take 1 tablet by mouth at bedtime.   Yes [provider]  isosorbide mononitrate (IMDUR) 60 MG 24 hr tablet Take 1 tablet (60 mg total) by mouth daily. 04/01/21  Yes Rip Harbour, NP  lisinopril (ZESTRIL) 40 MG tablet Take 1 tablet (40 mg total) by mouth daily. 04/01/21  Yes Rip Harbour, NP  metoprolol tartrate (LOPRESSOR) 50 MG tablet Take 1 tablet (50 mg total) by mouth 2 (two) times daily. 04/01/21  Yes Rip Harbour, NP  Omega-3 Fatty Acids (FISH OIL) 1000 MG CAPS Take 1 capsule (1,000 mg total) by mouth 2 (two) times daily. 11/30/20  Yes [name], [name], MD  pantoprazole (PROTONIX) 40 MG tablet Take 1 tablet (40 mg total) by mouth daily. Patient taking differently: Take 40 mg by mouth 2 (two) times daily. 02/15/21  Yes Rip Harbour, NP  rosuvastatin (CRESTOR) 40 MG tablet Take 1 tablet (40 mg total) by mouth daily. Patient taking differently: Take 40 mg by mouth at bedtime. 04/01/21  Yes Rip Harbour, NP    Current Facility-Administered Medications  Medication Dose Route Frequency Provider Last Rate Last Admin   acetaminophen (TYLENOL) tablet 650 mg  650 mg Oral Q6H PRN Orma Flaming, MD       albuterol (PROVENTIL) (2.5 MG/3ML) 0.083% nebulizer solution 2.5 mg  2.5 mg Nebulization Q6H PRN Orma Flaming, MD       fluticasone furoate-vilanterol (BREO ELLIPTA) 100-25 MCG/ACT 1 puff  1 puff Inhalation Daily Orma Flaming, MD       And   umeclidinium bromide (INCRUSE ELLIPTA) 62.5 MCG/ACT 1 puff  1 puff Inhalation Daily Orma Flaming, MD       heparin ADULT infusion 100 units/mL (25000 units/257m)  1,400 Units/hr Intravenous Continuous BLaren Everts RPH 14 mL/hr at 04/14/21 0405 1,400 Units/hr at 04/14/21 0405   lactated ringers infusion   Intravenous Continuous WOrma Flaming MD 125 mL/hr at 04/14/21 1042 New Bag at 04/14/21 1042   meropenem (MERREM) 1 g in sodium chloride 0.9 % 100 mL IVPB  1 g Intravenous Q12H WOrma Flaming MD   Stopped at 04/14/21 0901   morphine (PF) 2 MG/ML injection 1 mg  1 mg Intravenous Q3H PRN WOrma Flaming MD       ondansetron ([facility] tablet 4 mg  4 mg Oral Q6H PRN WOrma Flaming MD       Or   ondansetron ([facility] injection 4 mg  4 mg Intravenous Q6H PRN WOrma Flaming MD   4 mg at 04/14/21 0239   pantoprazole (PROTONIX) injection 40 mg  40 mg Intravenous Q12H WOrma Flaming MD   40 mg at 04/14/21 0933   potassium chloride 10 mEq in 100 mL IVPB  10 mEq Intravenous Q1 Hr x 4 KBonnielee Haff MD 100 mL/hr at 04/14/21 1042 10 mEq at 04/14/21 1042   Current Outpatient Medications  Medication Sig Dispense Refill   albuterol (VENTOLIN HFA) 108 (90 Base) MCG/ACT inhaler Inhale 2 puffs into the lungs every 6 (six) hours as needed for wheezing or shortness of breath. 8 g 0   apixaban (ELIQUIS) 5 MG TABS tablet Take 1 tablet (5 mg total) by mouth 2 (two) times daily. 90 tablet 0   clopidogrel (PLAVIX) 75 MG tablet Take 1 tablet (75 mg total) by mouth daily. 90 tablet 0   Evolocumab (REPATHA) 140 MG/ML SOSY Inject 140 mg into the skin every 14 (fourteen) days. 6 mL 0   Fluticasone-Umeclidin-Vilant (TRELEGY ELLIPTA) 100-62.5-25 MCG/ACT AEPB Inhale 1 puff into the lungs daily. 1 each 11   Ibuprofen-diphenhydrAMINE Cit (ADVIL PM PO) Take 1 tablet by mouth at bedtime.     isosorbide mononitrate (IMDUR) 60 MG 24 hr tablet Take 1 tablet (60 mg total) by mouth daily. 90 tablet 0   lisinopril (ZESTRIL) 40 MG tablet Take 1 tablet (40 mg total) by mouth daily. 90 tablet 0   metoprolol tartrate (LOPRESSOR) 50 MG tablet Take 1 tablet (50 mg total) by mouth 2 (two) times daily. 180 tablet 0   Omega-3 Fatty Acids  (FISH OIL) 1000 MG CAPS Take 1 capsule (1,000 mg total) by mouth 2 (two) times daily. 180 capsule 0   pantoprazole (PROTONIX) 40 MG tablet Take 1 tablet (40 mg total) by mouth daily. (Patient taking differently: Take 40 mg by mouth 2 (two) times daily.) 90 tablet 3   rosuvastatin (CRESTOR) 40 MG tablet Take 1 tablet (40 mg total) by mouth daily. (Patient taking differently: Take 40 mg by mouth at bedtime.) 90 tablet 0  Allergies as of 04/13/2021 - Review Complete 04/13/2021  Allergen Reaction Noted   Penicillin g Hives 07/16/2018    Review of Systems:    Constitutional: No weight loss, fever, chills, weakness or fatigue HEENT: Eyes: No change in vision               Ears, Nose, Throat:  No change in hearing or congestion Skin: No rash or itching Cardiovascular: No chest pain, chest pressure or palpitations   Respiratory: No SOB or cough Gastrointestinal: See HPI and otherwise negative Genitourinary: No dysuria or change in urinary frequency Neurological: No headache, dizziness or syncope Musculoskeletal: No new muscle or joint pain Hematologic: No bleeding or bruising Psychiatric: No history of depression or anxiety     Physical Exam:  Vital signs in last 24 hours: Temp:  [99.8 F (37.7 C)-100.8 F (38.2 C)] 99.8 F (37.7 C) (02/01 1722) Pulse Rate:  [94-123] 98 (02/02 1000) Resp:  [16-34] 27 (02/02 1000) BP: (99-143)/(65-90) 106/73 (02/02 1000) SpO2:  [90 %-99 %] 99 % (02/02 1000)    General:   Pleasant, well developed male in no acute distress Head:  Normocephalic and atraumatic.  Poor dentition Eyes: sclerae anicteric,conjunctive pink  Heart:  regular rate and rhythm, mildly tachycardic while in the room Pulm: Clear anteriorly; no wheezing, on 2 L nasal cannula Abdomen:  Soft, Obese AB, skin exam scars-well-healing lap Coley scars, Normal bowel sounds. mild tenderness in the upper abdomen. Without guarding and Without rebound,  Extremities:  Without edema. Msk:   Symmetrical without gross deformities. Peripheral pulses intact.  Neurologic:  Alert and  oriented x4;  grossly normal neurologically. Skin:   Dry and intact without significant lesions or rashes. Psychiatric: Demonstrates good judgement and reason without abnormal affect or behaviors.  LAB RESULTS: Recent Labs    04/13/21 1523 04/14/21 0313  WBC 18.2* 15.3*  HGB 11.1* 9.3*  HCT 34.2* 28.7*  PLT 651* 531*   BMET Recent Labs    04/13/21 1523 04/13/21 1854 04/14/21 0230  NA 135 130* 135  K 2.7* 3.4* 2.7*  CL 95* 96* 99  CO2 25 20* 23  GLUCOSE 106* 89 91  BUN 22* 22* 21*  CREATININE 2.49* 2.19* 2.10*  CALCIUM 9.2 7.9* 8.6*   LFT Recent Labs    04/14/21 0230  PROT 6.5  ALBUMIN 2.0*  AST 20  ALT 25  ALKPHOS 113  BILITOT 0.3   PT/INR Recent Labs    04/13/21 1523  LABPROT 25.8*  INR 2.4*    STUDIES: DG CHEST PORT 1 VIEW  Result Date: 04/13/2021 CLINICAL DATA:  Sepsis. EXAM: PORTABLE CHEST 1 VIEW COMPARISON:  Chest x-ray 03/27/2021. FINDINGS: The cardiomediastinal silhouette appears stable. There are patchy bilateral multifocal airspace opacities in the mid and lower lungs, mildly increased. There is no pleural effusion or pneumothorax. No acute fractures are seen. IMPRESSION: 1. Bilateral multifocal patchy airspace disease has increased from prior examination. Findings are worrisome for infection. Electronically Signed   By: Ronney Asters M.D.   On: 04/13/2021 19:17     [name]  04/14/2021, 11:03 AM

## 2021-04-15 ENCOUNTER — Inpatient Hospital Stay (HOSPITAL_COMMUNITY): Payer: Medicare HMO

## 2021-04-15 ENCOUNTER — Telehealth: Payer: Medicare HMO

## 2021-04-15 DIAGNOSIS — D649 Anemia, unspecified: Secondary | ICD-10-CM | POA: Diagnosis present

## 2021-04-15 DIAGNOSIS — K8689 Other specified diseases of pancreas: Secondary | ICD-10-CM | POA: Diagnosis not present

## 2021-04-15 DIAGNOSIS — E43 Unspecified severe protein-calorie malnutrition: Secondary | ICD-10-CM

## 2021-04-15 DIAGNOSIS — K859 Acute pancreatitis without necrosis or infection, unspecified: Secondary | ICD-10-CM | POA: Diagnosis not present

## 2021-04-15 DIAGNOSIS — E162 Hypoglycemia, unspecified: Secondary | ICD-10-CM

## 2021-04-15 DIAGNOSIS — N179 Acute kidney failure, unspecified: Secondary | ICD-10-CM | POA: Diagnosis not present

## 2021-04-15 DIAGNOSIS — Z86711 Personal history of pulmonary embolism: Secondary | ICD-10-CM | POA: Diagnosis not present

## 2021-04-15 HISTORY — DX: Unspecified severe protein-calorie malnutrition: E43

## 2021-04-15 HISTORY — DX: Hypoglycemia, unspecified: E16.2

## 2021-04-15 HISTORY — DX: Anemia, unspecified: D64.9

## 2021-04-15 LAB — CBC
HCT: 25.8 % — ABNORMAL LOW (ref 39.0–52.0)
HCT: 27.5 % — ABNORMAL LOW (ref 39.0–52.0)
Hemoglobin: 8.4 g/dL — ABNORMAL LOW (ref 13.0–17.0)
Hemoglobin: 8.9 g/dL — ABNORMAL LOW (ref 13.0–17.0)
MCH: 28.7 pg (ref 26.0–34.0)
MCH: 27.8 pg (ref 26.0–34.0)
MCHC: 32.6 g/dL (ref 30.0–36.0)
MCHC: 32.4 g/dL (ref 30.0–36.0)
MCV: 88.1 fL (ref 80.0–100.0)
MCV: 85.9 fL (ref 80.0–100.0)
Platelets: 495 10*3/uL — ABNORMAL HIGH (ref 150–400)
Platelets: 507 10*3/uL — ABNORMAL HIGH (ref 150–400)
RBC: 2.93 MIL/uL — ABNORMAL LOW (ref 4.22–5.81)
RBC: 3.2 MIL/uL — ABNORMAL LOW (ref 4.22–5.81)
RDW: 15.5 % (ref 11.5–15.5)
RDW: 15.4 % (ref 11.5–15.5)
WBC: 15 10*3/uL — ABNORMAL HIGH (ref 4.0–10.5)
WBC: 16.1 10*3/uL — ABNORMAL HIGH (ref 4.0–10.5)
nRBC: 0 % (ref 0.0–0.2)
nRBC: 0 % (ref 0.0–0.2)

## 2021-04-15 LAB — COMPREHENSIVE METABOLIC PANEL
ALT: 18 U/L (ref 0–44)
AST: 17 U/L (ref 15–41)
Albumin: 1.7 g/dL — ABNORMAL LOW (ref 3.5–5.0)
Alkaline Phosphatase: 98 U/L (ref 38–126)
Anion gap: 13 (ref 5–15)
BUN: 18 mg/dL (ref 6–20)
CO2: 25 mmol/L (ref 22–32)
Calcium: 8.4 mg/dL — ABNORMAL LOW (ref 8.9–10.3)
Chloride: 99 mmol/L (ref 98–111)
Creatinine, Ser: 1.79 mg/dL — ABNORMAL HIGH (ref 0.61–1.24)
GFR, Estimated: 44 mL/min — ABNORMAL LOW (ref >60)
Glucose, Bld: 64 mg/dL — ABNORMAL LOW (ref 70–99)
Potassium: 3 mmol/L — ABNORMAL LOW (ref 3.5–5.1)
Sodium: 137 mmol/L (ref 135–145)
Total Bilirubin: 0.6 mg/dL (ref 0.3–1.2)
Total Protein: 5.6 g/dL — ABNORMAL LOW (ref 6.5–8.1)

## 2021-04-15 LAB — URINE CULTURE: Culture: NO GROWTH

## 2021-04-15 LAB — GLUCOSE, CAPILLARY
Glucose-Capillary: 68 mg/dL — ABNORMAL LOW (ref 70–99)
Glucose-Capillary: 109 mg/dL — ABNORMAL HIGH (ref 70–99)
Glucose-Capillary: 73 mg/dL (ref 70–99)
Glucose-Capillary: 98 mg/dL (ref 70–99)

## 2021-04-15 LAB — HEPARIN LEVEL (UNFRACTIONATED): Heparin Unfractionated: >1.1 IU/mL — ABNORMAL HIGH (ref 0.30–0.70)

## 2021-04-15 LAB — C-REACTIVE PROTEIN: CRP: 22.7 mg/dL — ABNORMAL HIGH (ref <1.0)

## 2021-04-15 LAB — FOLATE: Folate: 8.6 ng/mL (ref >5.9)

## 2021-04-15 LAB — MAGNESIUM: Magnesium: 1.4 mg/dL — ABNORMAL LOW (ref 1.7–2.4)

## 2021-04-15 LAB — APTT
aPTT: 62 seconds — ABNORMAL HIGH (ref 24–36)
aPTT: 69 seconds — ABNORMAL HIGH (ref 24–36)

## 2021-04-15 LAB — PROTIME-INR
INR: 1.6 — ABNORMAL HIGH (ref 0.8–1.2)
Prothrombin Time: 18.7 seconds — ABNORMAL HIGH (ref 11.4–15.2)

## 2021-04-15 LAB — VITAMIN B12: Vitamin B-12: 291 pg/mL (ref 180–914)

## 2021-04-15 MED ORDER — POTASSIUM CHLORIDE CRYS ER 20 MEQ PO TBCR
40.0000 meq | EXTENDED_RELEASE_TABLET | Freq: Once | ORAL | Status: AC
Start: 2021-04-15 — End: 2021-04-15
  Administered 2021-04-15: 40 meq via ORAL
  Filled 2021-04-15: qty 2

## 2021-04-15 MED ORDER — DEXTROSE 50 % IV SOLN
INTRAVENOUS | Status: AC
  Filled 2021-04-15: qty 50

## 2021-04-15 MED ORDER — POTASSIUM CHLORIDE 10 MEQ/100ML IV SOLN
10.0000 meq | INTRAVENOUS | Status: AC
  Administered 2021-04-15 (×4): 10 meq via INTRAVENOUS
  Filled 2021-04-15 (×4): qty 100

## 2021-04-15 MED ORDER — LIDOCAINE VISCOUS HCL 2 % MT SOLN
OROMUCOSAL | Status: AC
  Administered 2021-04-15: 3 mL via NASAL
  Filled 2021-04-15: qty 15

## 2021-04-15 MED ORDER — DEXTROSE-NACL 5-0.9 % IV SOLN: INTRAVENOUS | Status: DC

## 2021-04-15 MED ORDER — DEXTROSE 50 % IV SOLN
12.5000 g | INTRAVENOUS | Status: AC
  Administered 2021-04-15: 12.5 g via INTRAVENOUS

## 2021-04-15 MED ORDER — METOPROLOL TARTRATE 25 MG PO TABS
25.0000 mg | ORAL_TABLET | Freq: Two times a day (BID) | ORAL | Status: DC
  Administered 2021-04-15 – 2021-04-25 (×19): 25 mg via ORAL
  Filled 2021-04-15 (×19): qty 1

## 2021-04-15 MED ORDER — MAGNESIUM SULFATE 4 GM/100ML IV SOLN
4.0000 g | Freq: Once | INTRAVENOUS | Status: AC
  Administered 2021-04-15: 4 g via INTRAVENOUS
  Filled 2021-04-15 (×2): qty 100

## 2021-04-15 MED ORDER — PANTOPRAZOLE SODIUM 40 MG PO TBEC
40.0000 mg | DELAYED_RELEASE_TABLET | Freq: Two times a day (BID) | ORAL | Status: DC
  Administered 2021-04-15 – 2021-05-10 (×50): 40 mg via ORAL
  Filled 2021-04-15 (×50): qty 1

## 2021-04-15 MED ORDER — DIATRIZOATE MEGLUMINE & SODIUM 66-10 % PO SOLN
ORAL | Status: AC
  Administered 2021-04-15: 10 mL via GASTROSTOMY
  Filled 2021-04-15: qty 30

## 2021-04-15 NOTE — Assessment & Plan Note (Addendum)
Resolved with initiation of fluids and nutrition.

## 2021-04-15 NOTE — Consult Note (Signed)
Chief Complaint: Patient was seen in consultation today for pancreatitis at the request of Bonnielee Haff  Supervising Physician: Mir, Sharen Heck  Patient Status: [facility] - In-pt  History of Present Illness: Scott Foley is a 56 y.o. male with medical history significant of hx of CAD, HTN, hx of PE on eliquis, HLD, hx of CVA in 2014, IDA with history of recent hospitalizaion on 03/16/21 for recurrent pancreatitis, sepsis due to bacteremia from klebsiella pneumoniae, covid-19 and respiratory failure requiring home oxygen on discharge.   Presented with a 4-day history of diffuse worsening abdominal pain.  He has had abdominal issues for the past 2 months.  He had a cholecystectomy on 12/21 and underwent ERCP on 12/22 since the intraoperative cholangiogram showed a CBD stone. After ERCP he developed pancreatitis.  He was managed conservatively and then discharged home on 12/28.   He was readmitted to [facility] on 1/4 for recurrent pancreatitis and COVID-19.  Noted to have sepsis respiratory failure.  Blood cultures were positive for Klebsiella pneumonia.  He was treated with Invanz for 10 days.  Discharged on 2 L of oxygen.  He had a postop visit with surgeon on 04/12/21 when he complained of abdominal pain.  A CT scan was subsequently done which showed pancreatic necrosis with gas within the pancreatic fluid collections.  He was sent over to [facility] emergency department.  He was seen by general surgery.  Hospitalized for further management.  Placed on antibiotics.  Also noted to have acute kidney injury.  Denied any significant alcohol use. Interventional radiology consulted for aspiration of the fluid collection.  Wife at bedside today.  Both are looking forward to clinical improvement.  Past Medical History:  Diagnosis Date   Acquired thrombophilia ([place]) 12/06/2020   Chest pain 02/09/2021   Chronic obstructive pulmonary disease ([place]) 07/16/2018   Colon polyp 10/25/2020    Coronary artery disease due to lipid rich plaque 01/01/2021   Coronary artery disease with stable angina pectoris ([place])    AMI No heart cath, treated medically [place] [facility]   DNR (do not resuscitate) 03/03/2019   Hyperlipemia    Hypertension    Hypertensive heart disease without congestive heart failure 12/06/2020   Morbid obesity ([place]) 12/06/2020   BMI 35 with serious comorbidities including CAD, HTN, STROKE, Hyperlipidemia.   Need for vaccination 12/15/2019   Pulmonary embolus ([place]) 03/03/2019   Pulmonary nodules/lesions, multiple 11/25/2012   9/15/2014CT Chest : small nodule RUL LUL. Stable since 03/2012 Smoking cessation advised and Rx chantix Repeat CT Chest 4 /21/15: subpleural lymph nodes, likely benign repeat one year 2016>>>no change, considered benign Arlyce Harman 11/25/2012  NORMAL   Sequela, post-stroke 07/28/2019   Tobacco use disorder 11/25/2012    Past Surgical History:  Procedure Laterality Date   COLONOSCOPY WITH PROPOFOL N/A 10/25/2020   Procedure: COLONOSCOPY WITH PROPOFOL;  Surgeon: Jonathon Bellows, MD;  Location: [facility] ENDOSCOPY;  Service: Gastroenterology;  Laterality: N/A;   TONSILLECTOMY      Allergies: Penicillin g  Medications: Prior to Admission medications   Medication Sig Start Date End Date Taking? Authorizing Provider  albuterol (VENTOLIN HFA) 108 (90 Base) MCG/ACT inhaler Inhale 2 puffs into the lungs every 6 (six) hours as needed for wheezing or shortness of breath. 04/04/21  Yes Rip Harbour, NP  apixaban (ELIQUIS) 5 MG TABS tablet Take 1 tablet (5 mg total) by mouth 2 (two) times daily. 04/01/21  Yes Rip Harbour, NP  clopidogrel (PLAVIX) 75 MG tablet Take 1 tablet (75  mg total) by mouth daily. 04/01/21  Yes Rip Harbour, NP  Evolocumab (REPATHA) 140 MG/ML SOSY Inject 140 mg into the skin every 14 (fourteen) days. 12/30/20  Yes [name], [name], MD  Fluticasone-Umeclidin-Vilant (TRELEGY ELLIPTA) 100-62.5-25 MCG/ACT AEPB Inhale 1 puff into the lungs daily.  04/04/21  Yes Rip Harbour, NP  Ibuprofen-diphenhydrAMINE Cit (ADVIL PM PO) Take 1 tablet by mouth at bedtime.   Yes [provider]  isosorbide mononitrate (IMDUR) 60 MG 24 hr tablet Take 1 tablet (60 mg total) by mouth daily. 04/01/21  Yes Rip Harbour, NP  lisinopril (ZESTRIL) 40 MG tablet Take 1 tablet (40 mg total) by mouth daily. 04/01/21  Yes Rip Harbour, NP  metoprolol tartrate (LOPRESSOR) 50 MG tablet Take 1 tablet (50 mg total) by mouth 2 (two) times daily. 04/01/21  Yes Rip Harbour, NP  Omega-3 Fatty Acids (FISH OIL) 1000 MG CAPS Take 1 capsule (1,000 mg total) by mouth 2 (two) times daily. 11/30/20  Yes [name], [name], MD  pantoprazole (PROTONIX) 40 MG tablet Take 1 tablet (40 mg total) by mouth daily. Patient taking differently: Take 40 mg by mouth 2 (two) times daily. 02/15/21  Yes Rip Harbour, NP  rosuvastatin (CRESTOR) 40 MG tablet Take 1 tablet (40 mg total) by mouth daily. Patient taking differently: Take 40 mg by mouth at bedtime. 04/01/21  Yes Rip Harbour, NP     Family History  Problem Relation Age of Onset   Hypertension Father    Heart attack Father    Stroke Father    Prostate cancer Father    Hypertension Sister    Hypertension Brother    Heart attack Brother    Lung cancer Sister     Social History   Socioeconomic History   Marital status: Married    Spouse name: Not on file   Number of children: Not on file   Years of education: Not on file   Highest education level: Not on file  Occupational History   Not on file  Tobacco Use   Smoking status: Former    Packs/day: 0.75    Years: 30.00    Pack years: 22.50    Types: Cigarettes    Start date: 03/14/1979    Quit date: 11/2020    Years since quitting: 0.4   Smokeless tobacco: Never   Tobacco comments:    Will start chantix.   Vaping Use   Vaping Use: Former   Start date: 07/28/2018  Substance and Sexual Activity   Alcohol use: No   Drug use: No   Sexual  activity: Not on file  Other Topics Concern   Not on file  Social History Narrative   Not on file   Social Determinants of Health   Financial Resource Strain: Not on file  Food Insecurity: Not on file  Transportation Needs: Not on file  Physical Activity: Not on file  Stress: Not on file  Social Connections: Not on file    Review of Systems  Constitutional:  Positive for activity change, appetite change and fatigue.  HENT: Negative.    Eyes: Negative.   Respiratory: Negative.    Cardiovascular: Negative.   Gastrointestinal:  Positive for abdominal pain and nausea.  Endocrine: Negative.   Genitourinary: Negative.   Musculoskeletal: Negative.   Allergic/Immunologic: Negative.   Neurological: Negative.   Hematological: Negative.   Psychiatric/Behavioral: Negative.     Vital Signs: BP 123/70 (BP Location: Right Arm)    Pulse Marland Kitchen)  105    Temp 98.9 F (37.2 C) (Oral)    Resp 20    SpO2 97%   Physical Exam Constitutional:      Appearance: He is ill-appearing.  HENT:     Head: Normocephalic.     Mouth/Throat:     Mouth: Mucous membranes are moist.     Pharynx: Oropharynx is clear.  Eyes:     Extraocular Movements: Extraocular movements intact.  Cardiovascular:     Rate and Rhythm: Tachycardia present.  Pulmonary:     Comments: Increased work of breathing Abdominal:     Palpations: Abdomen is soft.     Tenderness: There is generalized abdominal tenderness.  Skin:    General: Skin is dry.     Capillary Refill: Capillary refill takes 2 to 3 seconds.  Neurological:     General: No focal deficit present.     Mental Status: He is alert.  Psychiatric:        Mood and Affect: Mood normal.    Imaging: DG CHEST PORT 1 VIEW  Result Date: 04/13/2021 CLINICAL DATA:  Sepsis. EXAM: PORTABLE CHEST 1 VIEW COMPARISON:  Chest x-ray 03/27/2021. FINDINGS: The cardiomediastinal silhouette appears stable. There are patchy bilateral multifocal airspace opacities in the mid and lower  lungs, mildly increased. There is no pleural effusion or pneumothorax. No acute fractures are seen. IMPRESSION: 1. Bilateral multifocal patchy airspace disease has increased from prior examination. Findings are worrisome for infection. Electronically Signed   By: Ronney Asters M.D.   On: 04/13/2021 19:17    Labs:  CBC: Recent Labs    04/01/21 1047 04/13/21 1523 04/14/21 0313 04/15/21 0153  WBC 20.9* 18.2* 15.3* 15.0*  HGB 11.6* 11.1* 9.3* 8.4*  HCT 34.0* 34.2* 28.7* 25.8*  PLT 417 651* 531* 495*    COAGS: Recent Labs    04/13/21 1523 04/14/21 0230 04/14/21 1130 04/14/21 2246 04/15/21 0153 04/15/21 0909  INR 2.4*  --   --   --  1.6*  --   APTT 52* 60* 68* 86*  --  62*    BMP: Recent Labs    04/13/21 1523 04/13/21 1854 04/14/21 0230 04/15/21 0153  NA 135 130* 135 137  K 2.7* 3.4* 2.7* 3.0*  CL 95* 96* 99 99  CO2 25 20* 23 25  GLUCOSE 106* 89 91 64*  BUN 22* 22* 21* 18  CALCIUM 9.2 7.9* 8.6* 8.4*  CREATININE 2.49* 2.19* 2.10* 1.79*  GFRNONAA 30* 34* 36* 44*    LIVER FUNCTION TESTS: Recent Labs    04/01/21 1047 04/13/21 1523 04/14/21 0230 04/15/21 0153  BILITOT 0.6 0.4 0.3 0.6  AST 19 28 20 17   ALT 28 35 25 18  ALKPHOS 141* 148* 113 98  PROT 6.5 7.9 6.5 5.6*  ALBUMIN 3.1* 2.4* 2.0* 1.7*     Assessment and Plan:  Pancreatitis --Peripancreatic fluid collection, amenable to drainage --Last Plavix and Eliquis on 2/1. --Will tentatively plan for intervention on 2/6 --Will need to hold heparin 2-4 hours prior to case and be NPO at midnight Sunday 2/5.  Risks and benefits discussed with the patient including bleeding, infection, damage to adjacent structures, bowel perforation/fistula connection, and sepsis.  All of the patient's questions were answered, patient is agreeable to proceed. Consent signed and in IR.   Thank you for this interesting consult.  I greatly enjoyed meeting Scott Foley and look forward to participating in their care.   A copy of this report was sent to the requesting provider  on this date.  Electronically Signed: Pasty Spillers, PA 04/15/2021, 1:48 PM   I spent a total of 40 Minutes in face to face in clinical consultation, greater than 50% of which was counseling/coordinating care for drain placement

## 2021-04-15 NOTE — Progress Notes (Signed)
Cortrak Tube Team Note:  Consult received to place a Cortrak feeding tube. RD attempted to place tube, could not advance tube into pt stomach. Placement was unsuccessful. Reached out to PA to have tube placement attempted by fluoroscopy. RD placed consult for post-pyloric placement in fluoro.   Roxana Hires, RD, LDN Clinical Dietitian See [facility] for contact information.

## 2021-04-15 NOTE — Progress Notes (Signed)
Hypoglycemic Event  CBG: 68  Treatment: D50 25 mL (12.5 gm)  Symptoms: None  Follow-up CBG: Time:0558 CBG Result:109  Possible Reasons for Event: Inadequate meal intake  Comments/MD notified:Daniels, NP notified    Frimy Uffelman N Missi Mcmackin

## 2021-04-15 NOTE — Progress Notes (Signed)
ANTICOAGULATION CONSULT NOTE - Follow Up Consult  Pharmacy Consult for heparin Indication:  h/o PE  Labs: Recent Labs    04/13/21 1523 04/13/21 1854 04/14/21 0230 04/14/21 0313 04/14/21 1130 04/14/21 2246 04/15/21 0153 04/15/21 0909  HGB 11.1*  --   --  9.3*  --   --  8.4*  --   HCT 34.2*  --   --  28.7*  --   --  25.8*  --   PLT 651*  --   --  531*  --   --  495*  --   APTT 52*  --  60*  --  68* 86*  --  62*  LABPROT 25.8*  --   --   --   --   --  18.7*  --   INR 2.4*  --   --   --   --   --  1.6*  --   HEPARINUNFRC  --   --  >1.10*  --   --  >1.10*  --  >1.10*  CREATININE 2.49* 2.19* 2.10*  --   --   --  1.79*  --      Assessment: 55yo male subtherapeutic on heparin with initial dosing while Eliquis on hold.  PTT came back at 62 this AM. Heparin level is not correlating yet. We will increase rate and recheck a level.   Goal of Therapy:  aPTT 66-102 seconds   Plan:  Increase heparin to 1650 units / hr 8 hr PTT Daily labs Monitor CBC and s/sx of bleeding daily  Onnie Boer, PharmD, [place], AAHIVP, CPP Infectious Disease Pharmacist 04/15/2021 11:41 AM

## 2021-04-15 NOTE — Progress Notes (Addendum)
[facility] PROGRESS NOTE   Scott Foley YOV:[id number] DOB: 05-Jul-1965 DOA: 04/13/2021  2 DOS: the patient was seen and examined on 04/15/2021  PCP: [name], MD  Brief History and Hospital Course:  56 y.o. male with medical history significant of hx of CAD, HTN, hx of PE on eliquis, HLD, hxo of CVA in 2014, IDA with history of recent hospitalizaion on 03/16/21 for recurrent pancreatitis, sepsis due to bacteremia from klebsiella pneumoniae, covid-19 and respiratory failure requiring home oxygen on discharge.  Presented with a 4-day history of diffuse worsening abdominal pain.  He has had abdominal issues for the past 2 months.  He had a cholecystectomy on 12/21 and underwent ERCP on 12/22 since the intraoperative cholangiogram showed a CBD stone.  After ERCP he developed pancreatitis.  He was managed conservatively and then discharged home on 12/28.  He was readmitted to [facility] on 1/4 for recurrent pancreatitis and COVID-19.  Noted to have sepsis respiratory failure.  Blood cultures were positive for Klebsiella pneumonia.  He was treated with Invanz for 10 days.  Discharged on 2 L of oxygen. He had a postop visit with surgeon on 04/12/21 when he complained of abdominal pain.  A CT scan was subsequently done which showed pancreatic necrosis with gas within the pancreatic fluid collections.  He was sent over to [facility] emergency department.  He was seen by general surgery.  Hospitalized for further management.  Placed on antibiotics.  Also noted to have acute kidney injury.  Denied any significant alcohol use. Patient is seen imaging and surgery as well as gastroenterology.  Interventional radiology consulted for aspiration of the fluid collection.  Consultants: General surgery.  Gastroenterology.  Interventional radiology  Procedures: None    Subjective: States that pain is 1 out of 10 in intensity this morning.  Slightly better compared to yesterday.  Denies any  nausea or vomiting.  No shortness of breath.      Assessment/Plan:   * Severe acute pancreatitis with sepsis - (present on admission) This is either a third episode of pancreatitis or sequelae of pancreatitis from his episode in January.  His lipase level was noted to be normal.  He does have significant leukocytosis.  CT scan was done at [facility]  Do not have access to that report but according to the H&P it showed progressive severe pancreatitis with extensive fluid collection surrounding the pancreas and extending into the right retroperitoneum.  Many of these fluid collections containing gas concerning for infection.  Compression of splenic vein and SMV was noted although they appear to be patent.  Patient was started on meropenem.  Gastroenterology and general surgery have been consulted.  No role for endoscopic drainage at this time.  Interventional radiology consulted for percutaneous drainage.  Sepsis ([place])- (present on admission) On admission he met sepsis criteria with tachycardia, fever to 100.8, WBC to 18.2.  Recently noted to have Klebsiella bacteremia earlier in January when he was hospitalized in [place].  Follow-up on blood cultures which are negative so far.  Hypokalemia- (present on admission) Hypomagnesemia  Likely secondary to GI losses from vomiting Continue to replete aggressively.  Also supplement magnesium.    AKI (acute kidney injury) ([place])- (present on admission) This is likely prerenal in the setting of sepsis along with nausea and vomiting over the past few days.   Patient being aggressively hydrated.  Renal function is gradually improving.  Monitor urine output.  UA reviewed.  Avoid nephrotoxic agents.    Normocytic  anemia- (present on admission) Significant drop in hemoglobin noted most of which appears to be dilutional.   No obvious evidence of overt bleeding.  Check anemia panel.  Watch for signs of bleeding. He is noted to be on IV heparin.  History of  pulmonary embolism- (present on admission) Eliquis is currently on hold.  He is on heparin infusion.    Chronic respiratory failure with hypoxia ([place])- (present on admission) Recently recovered from COVID-19 infection.  Was discharged on 2 L of oxygen by nasal cannula.  Chest x-ray continues to show opacities which could be from his recent infection rather than any infection.   Respiratory status stable for the most part.  Continue to monitor.  Chronic obstructive pulmonary disease ([place])- (present on admission) No signs of exacerbation. Continue trelegy daily and albuterol prn.  Essential hypertension- (present on admission) Blood pressures lower range of normal.  Antihypertensive including ACE inhibitor on hold.  Hyperlipemia- (present on admission) Hold crestor with pancreatitis/AKI and NPO.  Triglyceride was 121 in December.  Coronary artery disease with stable angina pectoris ([place])- (present on admission) Stable.  Plavix currently on hold.   Hypoglycemia Noted to have low glucose levels this morning. Likely due to poor oral intake/NPO status. Initiate D5 infusion till enteral nutrition is initiated.   Obesity Estimated body mass index is 32.1 kg/m as calculated from the following:   Height as of 04/01/21: _0  (1.626 m).   Weight as of 04/01/21: 84.8 kg.  ADDENDUM Called by RN that the patient was tachycardic.  Patient seen at bedside.  Telemetry shows sinus tachycardia.  Patient denies any chest pain shortness of breath.  Did have some abdominal pain a little while ago for which she was given oral pain medications.  Denies any dizziness lightheadedness.  Lungs reveal diminished air entry at the bases.  No crackles appreciated.  S1-S2 is tachycardic regular.  Abdomen remains tender as earlier today.  No rebound rigidity or guarding.  Home medication list reviewed.  He was on beta-blocker prior to admission.  Could be experiencing some rebound tachycardia but most of his tachycardia  is likely due to his acute illness.  We will resume beta-blocker at a lower dose.  Noted to be mildly hypoxic which could be from poor effort.  Place oxygen.  Follow-up on CBC.   DVT Prophylaxis: On IV heparin Code Status: Full code Family Communication: Discussed with patient Disposition Plan: To be determined  Status is: Inpatient Remains inpatient appropriate because: Sepsis with pancreatic necrosis with infection        Medications: Scheduled:  dextrose       fluticasone furoate-vilanterol  1 puff Inhalation Daily   And   umeclidinium bromide  1 puff Inhalation Daily   pantoprazole  40 mg Oral BID   Continuous:  dextrose 5 % and 0.9% NaCl 100 mL/hr at 04/15/21 0808   heparin 1,500 Units/hr (04/15/21 0907)   magnesium sulfate bolus IVPB 4 g (04/15/21 1032)   meropenem (MERREM) IV 1 g (04/15/21 0858)   potassium chloride 10 mEq (04/15/21 1038)   HUT:[id number], albuterol, morphine injection, ondansetron **OR** ondansetron (ZOFRAN) IV, oxyCODONE  Antibiotics: Anti-infectives (From admission, onward)    Start     Dose/Rate Route Frequency Ordered Stop   04/13/21 2000  meropenem (MERREM) 1 g in sodium chloride 0.9 % 100 mL IVPB        1 g 200 mL/hr over 30 Minutes Intravenous Every 12 hours 04/13/21 1934     04/13/21 1800  ceFEPIme (MAXIPIME) 2 g in sodium chloride 0.9 % 100 mL IVPB  Status:  Discontinued        2 g 200 mL/hr over 30 Minutes Intravenous Every 12 hours 04/13/21 1731 04/13/21 1852   04/13/21 1715  levofloxacin (LEVAQUIN) IVPB 750 mg  Status:  Discontinued        750 mg 100 mL/hr over 90 Minutes Intravenous  Once 04/13/21 1713 04/13/21 1728   04/13/21 1715  metroNIDAZOLE (FLAGYL) IVPB 500 mg  Status:  Discontinued        500 mg 100 mL/hr over 60 Minutes Intravenous  Once 04/13/21 1713 04/13/21 1852       Objective:  Vital Signs  Vitals:   04/15/21 0532 04/15/21 0823 04/15/21 0826 04/15/21 0900  BP:  113/67    Pulse:  (!) 101 (!) 105   Resp:   18 (!) 24 20  Temp: 98.4 F (36.9 C) 98.4 F (36.9 C)    TempSrc: Oral Oral    SpO2:  96% 97% 97%    Intake/Output Summary (Last 24 hours) at 04/15/2021 1125 Last data filed at 04/15/2021 0500 Gross per 24 hour  Intake 4245.26 ml  Output 1050 ml  Net 3195.26 ml   There were no vitals filed for this visit.  General appearance: Awake alert.  In no distress Resp: Clear to auscultation bilaterally.  Normal effort Cardio: S1-S2 is normal regular.  No S3-S4.  No rubs murmurs or bruit GI: Abdomen is soft.  Tender in the upper abdomen without any rebound rigidity or guarding.  Bowel sounds present but sluggish.  No masses organomegaly Extremities: No edema.  Full range of motion of lower extremities. Neurologic: Alert and oriented x3.  No focal neurological deficits.     Lab Results:  Data Reviewed: I have personally reviewed labs and imaging study reports  CBC: Recent Labs  Lab 04/13/21 1523 04/14/21 0313 04/15/21 0153  WBC 18.2* 15.3* 15.0*  NEUTROABS 14.5*  --   --   HGB 11.1* 9.3* 8.4*  HCT 34.2* 28.7* 25.8*  MCV 85.9 86.7 88.1  PLT 651* 531* 495*    Basic Metabolic Panel: Recent Labs  Lab 04/13/21 1523 04/13/21 1854 04/14/21 0230 04/15/21 0153  NA 135 130* 135 137  K 2.7* 3.4* 2.7* 3.0*  CL 95* 96* 99 99  CO2 25 20* 23 25  GLUCOSE 106* 89 91 64*  BUN 22* 22* 21* 18  CREATININE 2.49* 2.19* 2.10* 1.79*  CALCIUM 9.2 7.9* 8.6* 8.4*  MG  --  1.6*  --  1.4*    GFR: CrCl cannot be calculated (Unknown ideal weight.).  Liver Function Tests: Recent Labs  Lab 04/13/21 1523 04/14/21 0230 04/15/21 0153  AST _0 ALT 35 25 18  ALKPHOS 148* 113 98  BILITOT 0.4 0.3 0.6  PROT 7.9 6.5 5.6*  ALBUMIN 2.4* 2.0* 1.7*    Recent Labs  Lab 04/13/21 1523  LIPASE 36    Coagulation Profile: Recent Labs  Lab 04/13/21 1523 04/15/21 0153  INR 2.4* 1.6*     Recent Results (from the past 240 hour(s))  Resp Panel by RT-PCR (Flu A&B, Covid) Nasopharyngeal  Swab     Status: None   Collection Time: 04/13/21  3:22 PM   Specimen: Nasopharyngeal Swab; Nasopharyngeal(NP) swabs in vial transport medium  Result Value Ref Range Status   SARS Coronavirus 2 by RT PCR NEGATIVE NEGATIVE Final    Comment: (NOTE) SARS-CoV-2 target nucleic acids are NOT DETECTED.  The SARS-CoV-2  RNA is generally detectable in upper respiratory specimens during the acute phase of infection. The lowest concentration of SARS-CoV-2 viral copies this assay can detect is 138 copies/mL. A negative result does not preclude SARS-Cov-2 infection and should not be used as the sole basis for treatment or other patient management decisions. A negative result may occur with  improper specimen collection/handling, submission of specimen other than nasopharyngeal swab, presence of viral mutation(s) within the areas targeted by this assay, and inadequate number of viral copies(<138 copies/mL). A negative result must be combined with clinical observations, patient history, and epidemiological information. The expected result is Negative.  Fact Sheet for Patients:  EntrepreneurPulse.com.au  Fact Sheet for Healthcare Providers:  IncredibleEmployment.be  This test is no t yet approved or cleared by the [place] FDA and  has been authorized for detection and/or diagnosis of SARS-CoV-2 by FDA under an Emergency Use Authorization (EUA). This EUA will remain  in effect (meaning this test can be used) for the duration of the COVID-19 declaration under Section 564(b)(1) of the Act, 21 U.S.C.section 360bbb-3(b)(1), unless the authorization is terminated  or revoked sooner.       Influenza A by PCR NEGATIVE NEGATIVE Final   Influenza B by PCR NEGATIVE NEGATIVE Final    Comment: (NOTE) The Xpert Xpress SARS-CoV-2/FLU/RSV plus assay is intended as an aid in the diagnosis of influenza from Nasopharyngeal swab specimens and should not be used as a sole  basis for treatment. Nasal washings and aspirates are unacceptable for Xpert Xpress SARS-CoV-2/FLU/RSV testing.  Fact Sheet for Patients: EntrepreneurPulse.com.au  Fact Sheet for Healthcare Providers: IncredibleEmployment.be  This test is not yet approved or cleared by the [place] FDA and has been authorized for detection and/or diagnosis of SARS-CoV-2 by FDA under an Emergency Use Authorization (EUA). This EUA will remain in effect (meaning this test can be used) for the duration of the COVID-19 declaration under Section 564(b)(1) of the Act, 21 U.S.C. section 360bbb-3(b)(1), unless the authorization is terminated or revoked.  Performed at [facility], [place] [address], [place], [place] [address]   Blood Culture (routine x 2)     Status: None (Preliminary result)   Collection Time: 04/13/21  3:24 PM   Specimen: BLOOD  Result Value Ref Range Status   Specimen Description BLOOD SITE NOT SPECIFIED  Final   Special Requests   Final    BOTTLES DRAWN AEROBIC AND ANAEROBIC Blood Culture adequate volume   Culture   Final    NO GROWTH 2 DAYS Performed at [facility], [place] [address] [place], [place] [address]    Report Status PENDING  Incomplete  Blood Culture (routine x 2)     Status: None (Preliminary result)   Collection Time: 04/13/21  5:55 PM   Specimen: BLOOD  Result Value Ref Range Status   Specimen Description BLOOD LEFT ANTECUBITAL  Final   Special Requests   Final    BOTTLES DRAWN AEROBIC AND ANAEROBIC Blood Culture results may not be optimal due to an excessive volume of blood received in culture bottles   Culture   Final    NO GROWTH 2 DAYS Performed at [facility], [place] [address], [place], [place] [address]    Report Status PENDING  Incomplete  Urine Culture     Status: None   Collection Time: 04/13/21  6:33 PM   Specimen: Urine, Clean Catch  Result Value Ref Range Status   Specimen Description URINE,  CLEAN CATCH  Final   Special Requests NONE  Final   Culture   Final    NO GROWTH Performed at [facility], [place] [address], [place], [place] [address]    Report Status 04/15/2021 FINAL  Final      Radiology Studies: DG CHEST PORT 1 VIEW  Result Date: 04/13/2021 CLINICAL DATA:  Sepsis. EXAM: PORTABLE CHEST 1 VIEW COMPARISON:  Chest x-ray 03/27/2021. FINDINGS: The cardiomediastinal silhouette appears stable. There are patchy bilateral multifocal airspace opacities in the mid and lower lungs, mildly increased. There is no pleural effusion or pneumothorax. No acute fractures are seen. IMPRESSION: 1. Bilateral multifocal patchy airspace disease has increased from prior examination. Findings are worrisome for infection. Electronically Signed   By: Ronney Asters M.D.   On: 04/13/2021 19:17       LOS: 2 days   [facility] Pager on [email or web address]  04/15/2021, 11:25 AM

## 2021-04-15 NOTE — Care Management Important Message (Signed)
Important Message  Patient Details  Name: Scott Foley MRN: [id number] Date of Birth: October 16, 1965   Medicare Important Message Given:  Yes     Hannah Beat 04/15/2021, 1:13 PM

## 2021-04-15 NOTE — Progress Notes (Addendum)
Initial Nutrition Assessment  DOCUMENTATION CODES:   Severe malnutrition in context of acute illness/injury  INTERVENTION:   When clinical status allows, initiate tube feeding via small bore feeding tube: Vital 1.5 at 25 ml/h, increase by 10 ml every 4 hours to goal rate of 65 ml/h (1560 ml per day) Prosource TF 45 ml BID  Provides 2420 kcal, 127 gm protein, 1192 ml free water daily  MVI with minerals once daily via tube.  Monitor magnesium, potassium, and phosphorus BID for at least 3 days, MD to replete as needed, as pt is at risk for refeeding syndrome given severe malnutrition with severe weight loss.  NUTRITION DIAGNOSIS:   Severe Malnutrition related to acute illness (pancreatitis) as evidenced by moderate muscle depletion, moderate fat depletion, energy intake < or equal to 50% for > or equal to 5 days, percent weight loss (11% weight loss within 3 months).  GOAL:   Patient will meet greater than or equal to 90% of their needs  MONITOR:   I & O's, Labs, Diet advancement  REASON FOR ASSESSMENT:   New TF    ASSESSMENT:   56 yo male admitted with severe acute pancreatitis with sepsis, AKI. PMH includes CAD, HTN, PE, HLD, CVA, IDA, pancreatitis, chronic respiratory failure.  Cortrak was ordered today, but unable to advance tube into the stomach.  Fluoroscopy placed small bore feeding tube this afternoon.   Spoke with patient and his wife at bedside. Patient had a lap chole 03/02/21, then ERCP on 03/03/21 with subsequent ERCP pancreatitis. Patient has been eating poorly since that time. He has been eating small amounts of chicken and mac & cheese more recently at home PTA. He recently had COVID and his taste has been altered since. "Nothing tastes right anymore." He has lost a lot of weight. Usual weight 210 lbs. No current weight available. He was 84.8 kg on 04/01/21. Suspect weight is even lower now. Weight history reveals an 11% weight loss over 1.5 months (from 02/20/21  to 04/01/21).  Patient meets criteria for severe malnutrition in the context of acute illness with 11% weight loss within 3 months, moderate depletion of muscle and subcutaneous fat mass, and intake meeting </= 50% of estimated nutrition requirements for >/= 5 days.   Labs reviewed. K 3, mag 1.4, glucose 64, folate 8.6 WNL, CRP 22.7, corrected calcium 10.2 WNL CBG: [phone]  Medications reviewed and include Protonix, KCl. IVF: D5 NS at 100 ml/h  NUTRITION - FOCUSED PHYSICAL EXAM:  Flowsheet Row Most Recent Value  Orbital Region Moderate depletion  Upper Arm Region Moderate depletion  Thoracic and Lumbar Region Moderate depletion  Buccal Region Moderate depletion  Temple Region No depletion  Clavicle Bone Region Moderate depletion  Clavicle and Acromion Bone Region Moderate depletion  Scapular Bone Region Moderate depletion  Dorsal Hand Moderate depletion  Patellar Region Unable to assess  Anterior Thigh Region Unable to assess  Posterior Calf Region Moderate depletion  Edema (RD Assessment) Mild  Hair Reviewed  Eyes Reviewed  Mouth Reviewed  Skin Reviewed  Nails Reviewed       Diet Order:   Diet Order             Diet NPO time specified Except for: Sips with Meds  Diet effective now                   EDUCATION NEEDS:   Not appropriate for education at this time  Skin:  Skin Assessment: Reviewed RN Assessment  Last  BM:  2/1  Height:   Ht Readings from Last 1 Encounters:  04/01/21 _0  (1.626 m)    Weight:   Wt Readings from Last 1 Encounters:  04/15/21 84.8 kg    Estimated Nutritional Needs:   Kcal:  2300-2500  Protein:  120 gm  Fluid:  2.3-2.5 L    Lucas Mallow RD, LDN, CNSC Please refer to Amion for contact information.

## 2021-04-15 NOTE — Assessment & Plan Note (Addendum)
Patient did have a significant drop in hemoglobin most of which was thought to be secondary to dilution.  Could also have blood into the pericardial space.   Anemia panel reviewed.  Vitamin B12 noted to be low normal at 254.  Being supplemented.  Folate 8.2.  Ferritin 1033.  TIBC 137.  Iron 16. He was transfused 1 unit of PRBC during the early part of this admission.  Hemoglobin again drifted down to 7.  Transfused another unit of PRBC on 2/20.  Was given Lasix afterwards.  He Hemoglobin has responded appropriately.  Continue to monitor.

## 2021-04-15 NOTE — Progress Notes (Signed)
° ° °  Brief Progress Note:  Received request for drainage of peripancreatic fluid collection.  This is amenable to drainage via CT and procedure is approved by Dr. Dwaine Gale.  Ideal to have a 5 day washout of his Plavix before proceeding with intervention.  Will tentatively plan for procedure on 2/6.  Last dose Plavix and Eliquis is 2/1.  Currently on Heparin gtt.  This will be stopped 2-4 hours prior on day of.  We will consult with and consent patient prior to case.  Electronically Signed: Pasty Spillers, PA 04/15/2021, 10:04 AM

## 2021-04-15 NOTE — Progress Notes (Signed)
ANTICOAGULATION CONSULT NOTE - Follow Up Consult  Pharmacy Consult for heparin Indication:  h/o PE  Labs: Recent Labs    04/13/21 1523 04/13/21 1854 04/14/21 0230 04/14/21 0313 04/14/21 1130 04/14/21 2246 04/15/21 0153 04/15/21 0909 04/15/21 1653 04/15/21 2008  HGB 11.1*  --   --  9.3*  --   --  8.4*  --  8.9*  --   HCT 34.2*  --   --  28.7*  --   --  25.8*  --  27.5*  --   PLT 651*  --   --  531*  --   --  495*  --  507*  --   APTT 52*  --  60*  --    < > 86*  --  62*  --  69*  LABPROT 25.8*  --   --   --   --   --  18.7*  --   --   --   INR 2.4*  --   --   --   --   --  1.6*  --   --   --   HEPARINUNFRC  --   --  >1.10*  --   --  >1.10*  --  >1.10*  --   --   CREATININE 2.49* 2.19* 2.10*  --   --   --  1.79*  --   --   --    < > = values in this interval not displayed.     Assessment: 56yo male subtherapeutic on heparin with initial dosing while Eliquis on hold.  aPTT 69 (on heparin 1650 units/hr) No documented signs/symptoms of bleed CBC stable this afternoon  Goal of Therapy:  aPTT 66-102 seconds   Plan:  Continue heparin 1650 units/hr Follow up heparin level and aPTT with am labs.  Daily CBC ordered  Thank you for allowing pharmacy to be a part of this patients care.  Donnald Garre, PharmD Clinical Pharmacist  Please check AMION for all [place] numbers After 10:00 PM, call [place] [phone]

## 2021-04-15 NOTE — Progress Notes (Signed)
Patient ID: Scott Foley, male   DOB: 11-27-1965, 56 y.o.   MRN: [id number] [facility] Progress Note     Subjective: CC-  Unchanged from yesterday. He reports mild abdominal pain that radiates across his upper abdomen. Rates pain as 2/10. Denies n/v. WBC stable at 15, afebrile.  Objective: Vital signs in last 24 hours: Temp:  [98.1 F (36.7 C)-98.9 F (37.2 C)] 98.4 F (36.9 C) (02/03 0823) Pulse Rate:  [98-108] 105 (02/03 0826) Resp:  [17-27] 20 (02/03 0900) BP: (104-117)/(66-75) 113/67 (02/03 0823) SpO2:  [94 %-99 %] 97 % (02/03 0900) Last BM Date: 04/13/21  Intake/Output from previous day: 02/02 0701 - 02/03 0700 In: 4245.3 [I.V.:3680.2; IV Piggyback:565] Out: 1050 [Urine:1050] Intake/Output this shift: No intake/output data recorded.  PE: Gen:  Alert, NAD, pleasant Card: mild tachy low 100s Pulm:  rate and effort normal Abd: Soft, ND, mild tenderness to deep palpation of the upper quadrants  Lab Results:  Recent Labs    04/14/21 0313 04/15/21 0153  WBC 15.3* 15.0*  HGB 9.3* 8.4*  HCT 28.7* 25.8*  PLT 531* 495*   BMET Recent Labs    04/14/21 0230 04/15/21 0153  NA 135 137  K 2.7* 3.0*  CL 99 99  CO2 23 25  GLUCOSE 91 64*  BUN 21* 18  CREATININE 2.10* 1.79*  CALCIUM 8.6* 8.4*   PT/INR Recent Labs    04/13/21 1523 04/15/21 0153  LABPROT 25.8* 18.7*  INR 2.4* 1.6*   CMP     Component Value Date/Time   NA 137 04/15/2021 0153   NA 140 04/01/2021 1047   K 3.0 (L) 04/15/2021 0153   CL 99 04/15/2021 0153   CO2 25 04/15/2021 0153   GLUCOSE 64 (L) 04/15/2021 0153   BUN 18 04/15/2021 0153   BUN 24 04/01/2021 1047   CREATININE 1.79 (H) 04/15/2021 0153   CALCIUM 8.4 (L) 04/15/2021 0153   PROT 5.6 (L) 04/15/2021 0153   PROT 6.5 04/01/2021 1047   ALBUMIN 1.7 (L) 04/15/2021 0153   ALBUMIN 3.1 (L) 04/01/2021 1047   AST 17 04/15/2021 0153   ALT 18 04/15/2021 0153   ALKPHOS 98 04/15/2021 0153   BILITOT 0.6 04/15/2021 0153    BILITOT 0.6 04/01/2021 1047   GFRNONAA 44 (L) 04/15/2021 0153   GFRAA 105 12/15/2019 1034   Lipase     Component Value Date/Time   LIPASE 36 04/13/2021 1523       Studies/Results: DG CHEST PORT 1 VIEW  Result Date: 04/13/2021 CLINICAL DATA:  Sepsis. EXAM: PORTABLE CHEST 1 VIEW COMPARISON:  Chest x-ray 03/27/2021. FINDINGS: The cardiomediastinal silhouette appears stable. There are patchy bilateral multifocal airspace opacities in the mid and lower lungs, mildly increased. There is no pleural effusion or pneumothorax. No acute fractures are seen. IMPRESSION: 1. Bilateral multifocal patchy airspace disease has increased from prior examination. Findings are worrisome for infection. Electronically Signed   By: Ronney Asters M.D.   On: 04/13/2021 19:17    Anti-infectives: Anti-infectives (From admission, onward)    Start     Dose/Rate Route Frequency Ordered Stop   04/13/21 2000  meropenem (MERREM) 1 g in sodium chloride 0.9 % 100 mL IVPB        1 g 200 mL/hr over 30 Minutes Intravenous Every 12 hours 04/13/21 1934     04/13/21 1800  ceFEPIme (MAXIPIME) 2 g in sodium chloride 0.9 % 100 mL IVPB  Status:  Discontinued        2 g  200 mL/hr over 30 Minutes Intravenous Every 12 hours 04/13/21 1731 04/13/21 1852   04/13/21 1715  levofloxacin (LEVAQUIN) IVPB 750 mg  Status:  Discontinued        750 mg 100 mL/hr over 90 Minutes Intravenous  Once 04/13/21 1713 04/13/21 1728   04/13/21 1715  metroNIDAZOLE (FLAGYL) IVPB 500 mg  Status:  Discontinued        500 mg 100 mL/hr over 60 Minutes Intravenous  Once 04/13/21 1713 04/13/21 1852        Assessment/Plan Post-ERCP pancreatitis, now with signs of infected necrosis  - s/p lap chole 03/02/21 by Dr. Lilia Pro with abnormal IOC >> underwent ERCP 03/03/21 - per GI fluid collection is not amenable to endoscopic drainage  - IR consult has been placed for consideration of percutaneous drainage - Keep NPO for possible procedure. Continue IV  antibiotics. Patient open to postpyloric tube feedings. Will order Cortrak placement for today but recommend waiting to start tube feedings until we know if IR is going to perform any procedures today. Continue holding plavix/ eliquis.    ID - merrem 2/1>> FEN - NPO VTE - hold plavix/ eliquis, on heparin gtt Foley - none   CAD HTN HLD COPD Hx PE ~3 years ago on eliquis (last dose 2/1) Hx CVA in 2014 on plavix (last dose 2/1) IDA Tobacco abuse - quit smoking 5 months ago  Moderate Medical Decision Making   LOS: 2 days    Wellington Hampshire, [facility] 04/15/2021, 9:38 AM Please see Amion for pager number during day hours 7:00am-4:30pm

## 2021-04-15 NOTE — Progress Notes (Addendum)
Progress Note   Subjective  Chief Complaint: Complicated pancreatitis  Patient currently n.p.o., denies nausea and vomiting. Continues to have intermittent abdominal pain. Denies any fever or chills overnight. Last bowel movement was yesterday.    Objective   Vital signs in last 24 hours: Temp:  [98.1 F (36.7 C)-98.9 F (37.2 C)] 98.4 F (36.9 C) (02/03 0823) Pulse Rate:  [98-108] 105 (02/03 0826) Resp:  [17-24] 20 (02/03 0900) BP: (104-117)/(66-75) 113/67 (02/03 0823) SpO2:  [94 %-98 %] 97 % (02/03 0900) Last BM Date: 04/13/21  General:   chronically ill appearing Heart:  regular rate and rhythm, mildly tachycardic while in the room Pulm: Clear anteriorly; no wheezing, on 2 L nasal cannula Abdomen:  Soft, Obese AB, skin exam scars-well-healing lap Coley scars, Normal bowel sounds. mild tenderness in the upper abdomen. Without guarding and Without rebound,  Extremities:  Without edema. Neurologic:  Alert and  oriented x4;  grossly normal neurologically. Psychiatric: Demonstrates good judgement and reason without abnormal affect or behaviors.  Intake/Output from previous day: 02/02 0701 - 02/03 0700 In: 4245.3 [I.V.:3680.2; IV Piggyback:565] Out: 1601 [Urine:1050] Intake/Output this shift: No intake/output data recorded.  Lab Results: Recent Labs    04/13/21 1523 04/14/21 0313 04/15/21 0153  WBC 18.2* 15.3* 15.0*  HGB 11.1* 9.3* 8.4*  HCT 34.2* 28.7* 25.8*  PLT 651* 531* 495*   BMET Recent Labs    04/13/21 1854 04/14/21 0230 04/15/21 0153  NA 130* 135 137  K 3.4* 2.7* 3.0*  CL 96* 99 99  CO2 20* 23 25  GLUCOSE 89 91 64*  BUN 22* 21* 18  CREATININE 2.19* 2.10* 1.79*  CALCIUM 7.9* 8.6* 8.4*   LFT Recent Labs    04/15/21 0153  PROT 5.6*  ALBUMIN 1.7*  AST 17  ALT 18  ALKPHOS 98  BILITOT 0.6   PT/INR Recent Labs    04/13/21 1523 04/15/21 0153  LABPROT 25.8* 18.7*  INR 2.4* 1.6*    Studies/Results: DG CHEST PORT 1 VIEW  Result  Date: 04/13/2021 CLINICAL DATA:  Sepsis. EXAM: PORTABLE CHEST 1 VIEW COMPARISON:  Chest x-ray 03/27/2021. FINDINGS: The cardiomediastinal silhouette appears stable. There are patchy bilateral multifocal airspace opacities in the mid and lower lungs, mildly increased. There is no pleural effusion or pneumothorax. No acute fractures are seen. IMPRESSION: 1. Bilateral multifocal patchy airspace disease has increased from prior examination. Findings are worrisome for infection. Electronically Signed   By: Ronney Asters M.D.   On: 04/13/2021 19:17      Impression/Plan:   Post ERCP complicated pancreatitis signs of infection and necrosis. Status post lap chole 12/21, ERCP 12/22 with subsequent ERCP pancreatitis WBC 15.0 (15.3) HGB 8.4 (9.3)  BUN 18 (18) Cr 1.79 (2.10) GFR 44  Discussed this case with Dr. Rush Landmark who did not feel that the fluid collection was amenable to endoscopic drainage Consulted IR for drainage of peripancreatic fluid collection, ideal to have 5-day washout of Plavix before proceeding.  They will tentatively plan for the procedure on 04/18/2021. Pending NJT for postpyloric tube feeds to optimize nutrition   Leukocytosis/ sepsis Lactic acid normal 02/01 On merrem   [place] Anemia Possible dilutional without overt bleeding  HGB 8.4 (9.3) (11.1) MCV 88.1  WBC 15.0 Platelets 495 04/01/2021 Iron 30 Ferritin 1,626 No B12/folate With recent decreased nutrition and iron def will check B12/folate Monitor daily, consider iron infusion  AKI/hypokalemia/hypomagnesium Potassium 3.0  Magnesium 1.4  BUN 18 (18) Cr 1.79 (2.10) IV fluids, replete, monitor. Tract  I and Os Per nurse decrease urine output  Hypercoagulability INR 2.4--1.6 - continue to monitor Last AB [place] 02/18/21 with diffuse and increased echogenicity, no overt cirrhosis   Recurrent PE On Eliquis, last dose 2/1 Transition to heparin by primary team.   History of CVA 2014 On Plavix, last dose 02/01.   Chronic  respiratory failure with hypoxia Since COVID-pneumonia, likely component of COPD with smoking history On 2 L [place]   Coronary artery disease with stable angina Continue medications, no chest pain at this time.   Future Appointments  Date Time Provider [place]  04/22/2021  1:30 PM Hunsucker, Bonna Gains, MD [facility] None  04/25/2021 10:30 AM [facility] None  04/25/2021 11:00 AM Marice Potter, MD [facility] None  07/04/2021  9:00 AM [name], MD [name] None      LOS: 2 days   [name]  04/15/2021, 10:12 AM

## 2021-04-16 DIAGNOSIS — N179 Acute kidney failure, unspecified: Secondary | ICD-10-CM | POA: Diagnosis not present

## 2021-04-16 DIAGNOSIS — Z86711 Personal history of pulmonary embolism: Secondary | ICD-10-CM | POA: Diagnosis not present

## 2021-04-16 DIAGNOSIS — K859 Acute pancreatitis without necrosis or infection, unspecified: Secondary | ICD-10-CM | POA: Diagnosis not present

## 2021-04-16 DIAGNOSIS — K8689 Other specified diseases of pancreas: Secondary | ICD-10-CM | POA: Diagnosis not present

## 2021-04-16 LAB — CBC
HCT: 27.5 % — ABNORMAL LOW (ref 39.0–52.0)
Hemoglobin: 8.6 g/dL — ABNORMAL LOW (ref 13.0–17.0)
MCH: 28 pg (ref 26.0–34.0)
MCHC: 31.3 g/dL (ref 30.0–36.0)
MCV: 89.6 fL (ref 80.0–100.0)
Platelets: 547 10*3/uL — ABNORMAL HIGH (ref 150–400)
RBC: 3.07 MIL/uL — ABNORMAL LOW (ref 4.22–5.81)
RDW: 15.8 % — ABNORMAL HIGH (ref 11.5–15.5)
WBC: 17.2 10*3/uL — ABNORMAL HIGH (ref 4.0–10.5)
nRBC: 0 % (ref 0.0–0.2)

## 2021-04-16 LAB — COMPREHENSIVE METABOLIC PANEL
ALT: 17 U/L (ref 0–44)
AST: 18 U/L (ref 15–41)
Albumin: 1.7 g/dL — ABNORMAL LOW (ref 3.5–5.0)
Alkaline Phosphatase: 92 U/L (ref 38–126)
Anion gap: 10 (ref 5–15)
BUN: 14 mg/dL (ref 6–20)
CO2: 24 mmol/L (ref 22–32)
Calcium: 8.3 mg/dL — ABNORMAL LOW (ref 8.9–10.3)
Chloride: 103 mmol/L (ref 98–111)
Creatinine, Ser: 1.37 mg/dL — ABNORMAL HIGH (ref 0.61–1.24)
GFR, Estimated: >60 mL/min (ref >60)
Glucose, Bld: 103 mg/dL — ABNORMAL HIGH (ref 70–99)
Potassium: 3.6 mmol/L (ref 3.5–5.1)
Sodium: 137 mmol/L (ref 135–145)
Total Bilirubin: 0.3 mg/dL (ref 0.3–1.2)
Total Protein: 5.8 g/dL — ABNORMAL LOW (ref 6.5–8.1)

## 2021-04-16 LAB — GLUCOSE, CAPILLARY
Glucose-Capillary: 106 mg/dL — ABNORMAL HIGH (ref 70–99)
Glucose-Capillary: 108 mg/dL — ABNORMAL HIGH (ref 70–99)
Glucose-Capillary: 109 mg/dL — ABNORMAL HIGH (ref 70–99)
Glucose-Capillary: 110 mg/dL — ABNORMAL HIGH (ref 70–99)
Glucose-Capillary: 112 mg/dL — ABNORMAL HIGH (ref 70–99)
Glucose-Capillary: 119 mg/dL — ABNORMAL HIGH (ref 70–99)

## 2021-04-16 LAB — RETICULOCYTES
Immature Retic Fract: 11.1 % (ref 2.3–15.9)
RBC.: 2.94 MIL/uL — ABNORMAL LOW (ref 4.22–5.81)
Retic Count, Absolute: 38.5 10*3/uL (ref 19.0–186.0)
Retic Ct Pct: 1.3 % (ref 0.4–3.1)

## 2021-04-16 LAB — APTT: aPTT: 74 seconds — ABNORMAL HIGH (ref 24–36)

## 2021-04-16 LAB — PROTIME-INR
INR: 1.4 — ABNORMAL HIGH (ref 0.8–1.2)
Prothrombin Time: 17 seconds — ABNORMAL HIGH (ref 11.4–15.2)

## 2021-04-16 LAB — FOLATE: Folate: 8.2 ng/mL (ref >5.9)

## 2021-04-16 LAB — IRON AND TIBC
Iron: 16 ug/dL — ABNORMAL LOW (ref 45–182)
Saturation Ratios: 12 % — ABNORMAL LOW (ref 17.9–39.5)
TIBC: 137 ug/dL — ABNORMAL LOW (ref 250–450)
UIBC: 121 ug/dL

## 2021-04-16 LAB — VITAMIN B12: Vitamin B-12: 254 pg/mL (ref 180–914)

## 2021-04-16 LAB — HEPARIN LEVEL (UNFRACTIONATED): Heparin Unfractionated: 0.76 IU/mL — ABNORMAL HIGH (ref 0.30–0.70)

## 2021-04-16 LAB — FERRITIN: Ferritin: 1033 ng/mL — ABNORMAL HIGH (ref 24–336)

## 2021-04-16 MED ORDER — SODIUM CHLORIDE 0.9 % IV SOLN
1.0000 g | Freq: Three times a day (TID) | INTRAVENOUS | Status: DC
  Administered 2021-04-16 – 2021-05-10 (×71): 1 g via INTRAVENOUS
  Filled 2021-04-16 (×82): qty 1

## 2021-04-16 MED ORDER — FREE WATER
30.0000 mL | Status: DC
  Administered 2021-04-16 – 2021-04-29 (×76): 30 mL

## 2021-04-16 MED ORDER — POTASSIUM CHLORIDE 20 MEQ PO PACK
40.0000 meq | PACK | Freq: Once | ORAL | Status: AC
  Administered 2021-04-16: 40 meq
  Filled 2021-04-16: qty 2

## 2021-04-16 MED ORDER — VITAL 1.5 CAL PO LIQD
1000.0000 mL | ORAL | Status: DC
  Administered 2021-04-16 – 2021-04-21 (×8): 1000 mL
  Filled 2021-04-16 (×10): qty 1000

## 2021-04-16 MED ORDER — OSMOLITE 1.2 CAL PO LIQD
1000.0000 mL | ORAL | Status: DC
  Filled 2021-04-16: qty 1000

## 2021-04-16 MED ORDER — PROSOURCE TF PO LIQD
45.0000 mL | Freq: Two times a day (BID) | ORAL | Status: DC
  Administered 2021-04-16 – 2021-04-29 (×26): 45 mL
  Filled 2021-04-16 (×27): qty 45

## 2021-04-16 MED ORDER — VITAMIN B-12 1000 MCG PO TABS
1000.0000 ug | ORAL_TABLET | Freq: Every day | ORAL | Status: DC
  Administered 2021-04-16 – 2021-05-05 (×19): 1000 ug
  Filled 2021-04-16 (×19): qty 1

## 2021-04-16 NOTE — Plan of Care (Signed)
  Problem: Education: Goal: Knowledge of General Education information will improve Description: Including pain rating scale, medication(s)/side effects and non-pharmacologic comfort measures Outcome: Progressing   Problem: Pain Managment: Goal: General experience of comfort will improve Outcome: Progressing   

## 2021-04-16 NOTE — Progress Notes (Signed)
Patient ID: Scott Foley, male   DOB: 03-22-1965, 56 y.o.   MRN: [id number] [facility] Surgery Progress Note     Subjective: CC-  Unchanged from yesterday. He reports mild abdominal pain that radiates across his upper abdomen. Rates pain as 2/10. Denies n/v. WBC 17, afebrile.  Objective: Vital signs in last 24 hours: Temp:  [97.7 F (36.5 C)-98.9 F (37.2 C)] 97.7 F (36.5 C) (02/04 0734) Pulse Rate:  [89-123] 95 (02/04 0540) Resp:  [18-21] 21 (02/04 0734) BP: (108-139)/(70-96) 108/72 (02/04 0734) SpO2:  [87 %-100 %] 98 % (02/04 0813) FiO2 (%):  [28 %] 28 % (02/04 0813) Weight:  [84.8 kg] 84.8 kg (02/03 1400) Last BM Date: 04/14/21  Intake/Output from previous day: 02/03 0701 - 02/04 0700 In: 400.1 [I.V.:200.1; IV Piggyback:200] Out: 350 [Urine:350] Intake/Output this shift: Total I/O In: 1053.8 [I.V.:953.8; IV Piggyback:100] Out: 475 [Urine:475]  PE: Gen:  Alert, NAD, pleasant Card: rrr Pulm:  rate and effort normal Abd: Soft, ND, minimal tenderness to deep palpation of the upper quadrants  Lab Results:  Recent Labs    04/15/21 1653 04/16/21 0649  WBC 16.1* 17.2*  HGB 8.9* 8.6*  HCT 27.5* 27.5*  PLT 507* 547*   BMET Recent Labs    04/15/21 0153 04/16/21 0649  NA 137 137  K 3.0* 3.6  CL 99 103  CO2 25 24  GLUCOSE 64* 103*  BUN 18 14  CREATININE 1.79* 1.37*  CALCIUM 8.4* 8.3*   PT/INR Recent Labs    04/15/21 0153 04/16/21 0649  LABPROT 18.7* 17.0*  INR 1.6* 1.4*   CMP     Component Value Date/Time   NA 137 04/16/2021 0649   NA 140 04/01/2021 1047   K 3.6 04/16/2021 0649   CL 103 04/16/2021 0649   CO2 24 04/16/2021 0649   GLUCOSE 103 (H) 04/16/2021 0649   BUN 14 04/16/2021 0649   BUN 24 04/01/2021 1047   CREATININE 1.37 (H) 04/16/2021 0649   CALCIUM 8.3 (L) 04/16/2021 0649   PROT 5.8 (L) 04/16/2021 0649   PROT 6.5 04/01/2021 1047   ALBUMIN 1.7 (L) 04/16/2021 0649   ALBUMIN 3.1 (L) 04/01/2021 1047   AST 18 04/16/2021  0649   ALT 17 04/16/2021 0649   ALKPHOS 92 04/16/2021 0649   BILITOT 0.3 04/16/2021 0649   BILITOT 0.6 04/01/2021 1047   GFRNONAA >60 04/16/2021 0649   GFRAA 105 12/15/2019 1034   Lipase     Component Value Date/Time   LIPASE 36 04/13/2021 1523       Studies/Results: DG Abd 1 View  Result Date: 04/15/2021 CLINICAL DATA:  Confirmation of post pyloric feeding tube placement EXAM: ABDOMEN - 1 VIEW COMPARISON:  None. FINDINGS: The bowel gas pattern is normal. Feeding tube coursing along the expected route of the duodenum with distal tip in the distal duodenum/proximal jejunum. Small amount of intraluminal Gastrografin in the distal duodenum/proximal jejunum. IMPRESSION: Satisfactory positioning of the feeding tube in the distal duodenum/proximal jejunum. Electronically Signed   By: Keane Police D.O.   On: 04/15/2021 16:58    Anti-infectives: Anti-infectives (From admission, onward)    Start     Dose/Rate Route Frequency Ordered Stop   04/13/21 2000  meropenem (MERREM) 1 g in sodium chloride 0.9 % 100 mL IVPB        1 g 200 mL/hr over 30 Minutes Intravenous Every 12 hours 04/13/21 1934     04/13/21 1800  ceFEPIme (MAXIPIME) 2 g in sodium chloride 0.9 %  100 mL IVPB  Status:  Discontinued        2 g 200 mL/hr over 30 Minutes Intravenous Every 12 hours 04/13/21 1731 04/13/21 1852   04/13/21 1715  levofloxacin (LEVAQUIN) IVPB 750 mg  Status:  Discontinued        750 mg 100 mL/hr over 90 Minutes Intravenous  Once 04/13/21 1713 04/13/21 1728   04/13/21 1715  metroNIDAZOLE (FLAGYL) IVPB 500 mg  Status:  Discontinued        500 mg 100 mL/hr over 60 Minutes Intravenous  Once 04/13/21 1713 04/13/21 1852        Assessment/Plan Post-ERCP pancreatitis, now with signs of infected necrosis  - s/p lap chole 03/02/21 by Dr. Lilia Pro with abnormal IOC >> underwent ERCP 03/03/21  - IR plans not to drain until Monday 2/6 - hold anticoag per IR -  Continue IV antibiotics. Patient open to  postpyloric tube feedings. Cortrak for tube feedings    ID - merrem 2/1>> FEN - NPO VTE - hold plavix/ eliquis, on heparin gtt Foley - none   CAD HTN HLD COPD Hx PE ~3 years ago on eliquis (last dose 2/1) Hx CVA in 2014 on plavix (last dose 2/1) IDA Tobacco abuse - quit smoking 5 months ago  Moderate Medical Decision Making   LOS: 3 days   Nadeen Landau, MD [facility], [place]

## 2021-04-16 NOTE — Progress Notes (Signed)
[facility] PROGRESS NOTE   Scott Foley JSH:[id number] DOB: 1965/07/11 DOA: 04/13/2021  3 DOS: the patient was seen and examined on 04/16/2021  PCP: [name], MD  Brief History and Hospital Course:  56 y.o. male with medical history significant of hx of CAD, HTN, hx of PE on eliquis, HLD, hxo of CVA in 2014, IDA with history of recent hospitalizaion on 03/16/21 for recurrent pancreatitis, sepsis due to bacteremia from klebsiella pneumoniae, covid-19 and respiratory failure requiring home oxygen on discharge.  Presented with a 4-day history of diffuse worsening abdominal pain.  He has had abdominal issues for the past 2 months.  He had a cholecystectomy on 12/21 and underwent ERCP on 12/22 since the intraoperative cholangiogram showed a CBD stone.  After ERCP he developed pancreatitis.  He was managed conservatively and then discharged home on 12/28.  He was readmitted to [facility] on 1/4 for recurrent pancreatitis and COVID-19.  Noted to have sepsis respiratory failure.  Blood cultures were positive for Klebsiella pneumonia.  He was treated with Invanz for 10 days.  Discharged on 2 L of oxygen. He had a postop visit with surgeon on 04/12/21 when he complained of abdominal pain.  A CT scan was subsequently done which showed pancreatic necrosis with gas within the pancreatic fluid collections.  He was sent over to [facility] emergency department.  He was seen by general surgery.  Hospitalized for further management.  Placed on antibiotics.  Also noted to have acute kidney injury.  Denied any significant alcohol use. Patient is seen imaging and surgery as well as gastroenterology.  Interventional radiology consulted for aspiration of the fluid collection.  Consultants: General surgery.  Gastroenterology.  Interventional radiology  Procedures: None    Subjective: States that pain is reasonably well controlled.  No nausea vomiting.  No shortness of breath.  No chest  pain.    Assessment/Plan:   * Severe acute pancreatitis with sepsis - (present on admission) This is either a third episode of pancreatitis or sequelae of pancreatitis from his episode in January.  His lipase level was noted to be normal.  He does have significant leukocytosis.  CT scan was done at [facility].  Do not have access to that report but according to the H&P it showed progressive severe pancreatitis with extensive fluid collection surrounding the pancreas and extending into the right retroperitoneum.  Many of these fluid collections containing gas concerning for infection.  Compression of splenic vein and SMV was noted although they appear to be patent.  Patient was started on meropenem.   Gastroenterology and general surgery have been consulted.  No role for endoscopic drainage at this time.  Interventional radiology consulted for percutaneous drainage which is scheduled for 2/6 to allow Plavix washout.  Sepsis ([name])- (present on admission) On admission he met sepsis criteria with tachycardia, fever to 100.8, WBC to 18.2.  Recently noted to have Klebsiella bacteremia earlier in January when he was hospitalized in [place].  Follow-up on blood cultures which are negative so far. WBC noted to be higher though he is afebrile.  Hypokalemia- (present on admission) Hypomagnesemia  Likely secondary to GI losses from vomiting Potassium level has improved.  Will give additional dose today.  Recheck magnesium level tomorrow.  AKI (acute kidney injury) ([name])- (present on admission) Presented with creatinine of 2.49. This is likely prerenal in the setting of sepsis along with nausea and vomiting over the past few days.   Patient aggressively hydrated.  Renal function has been  improving. Creatinine improved to 1.37 today.  Monitor urine output.  Avoid nephrotoxic agent.  Normocytic anemia- (present on admission) Significant drop in hemoglobin noted most of which appears to be dilutional.   Hemoglobin stable for the most part.  No evidence for overt bleeding. Anemia panel reviewed.  Vitamin B12 noted to be low normal at 254.  Will initiate supplementation.  Folate 8.2.  Ferritin 1033.  TIBC 137.  Iron 16.  History of pulmonary embolism- (present on admission) Eliquis is currently on hold.  He is on heparin infusion.    Chronic respiratory failure with hypoxia ([name])- (present on admission) Recently recovered from COVID-19 infection.  Was discharged on 2 L of oxygen by nasal cannula.  Chest x-ray continues to show opacities which could be from his recent infection rather than any infection.   Respiratory status stable for the most part.  Continue to monitor.  Chronic obstructive pulmonary disease ([name])- (present on admission) No signs of exacerbation. Continue trelegy daily and albuterol prn.  Essential hypertension- (present on admission) Sinus tachycardia  Blood pressures lower range of normal.  Antihypertensive including ACE inhibitor on hold.  Noted to have sinus tachycardia which is likely due to his acute illness.  He was on beta-blocker prior to admission and could have been experiencing some rebound tachycardia.  So he was started back on metoprolol.  Heart rate seems to be better.  Hyperlipemia- (present on admission) Hold crestor with pancreatitis/AKI and NPO.  Triglyceride was 121 in December.  Coronary artery disease with stable angina pectoris ([name])- (present on admission) Stable.  Plavix currently on hold.   Protein-calorie malnutrition, severe Core track feeding tube was placed.  Does not appear to have been started on tube feedings yet.  Will initiate.  Hypoglycemia Low glucose levels likely due to n.p.o. status.  Started on D5.  Glucose levels are stable    Obesity Estimated body mass index is 32.1 kg/m as calculated from the following:   Height as of this encounter: _0  (1.626 m).   Weight as of this encounter: 84.8 kg.   DVT Prophylaxis: On IV  heparin Code Status: Full code Family Communication: Discussed with patient Disposition Plan: To be determined  Status is: Inpatient Remains inpatient appropriate because: Sepsis with pancreatic necrosis with infection        Medications: Scheduled:  fluticasone furoate-vilanterol  1 puff Inhalation Daily   And   umeclidinium bromide  1 puff Inhalation Daily   metoprolol tartrate  25 mg Oral BID   pantoprazole  40 mg Oral BID   Continuous:  dextrose 5 % and 0.9% NaCl 100 mL/hr at 04/16/21 0920   heparin 1,650 Units/hr (04/16/21 0811)   meropenem (MERREM) IV 1 g (04/16/21 0821)   QHU:[id number], albuterol, morphine injection, ondansetron **OR** ondansetron (ZOFRAN) IV, oxyCODONE  Antibiotics: Anti-infectives (From admission, onward)    Start     Dose/Rate Route Frequency Ordered Stop   04/13/21 2000  meropenem (MERREM) 1 g in sodium chloride 0.9 % 100 mL IVPB        1 g 200 mL/hr over 30 Minutes Intravenous Every 12 hours 04/13/21 1934     04/13/21 1800  ceFEPIme (MAXIPIME) 2 g in sodium chloride 0.9 % 100 mL IVPB  Status:  Discontinued        2 g 200 mL/hr over 30 Minutes Intravenous Every 12 hours 04/13/21 1731 04/13/21 1852   04/13/21 1715  levofloxacin (LEVAQUIN) IVPB 750 mg  Status:  Discontinued  750 mg 100 mL/hr over 90 Minutes Intravenous  Once 04/13/21 1713 04/13/21 1728   04/13/21 1715  metroNIDAZOLE (FLAGYL) IVPB 500 mg  Status:  Discontinued        500 mg 100 mL/hr over 60 Minutes Intravenous  Once 04/13/21 1713 04/13/21 1852       Objective:  Vital Signs  Vitals:   04/16/21 0540 04/16/21 0734 04/16/21 0813 04/16/21 0829  BP: 121/80 108/72    Pulse: 95     Resp:  (!) 21    Temp: 97.9 F (36.6 C) 97.7 F (36.5 C)    TempSrc: Oral Oral    SpO2: 100%  98%   Weight:      Height:    _0  (1.626 m)    Intake/Output Summary (Last 24 hours) at 04/16/2021 1034 Last data filed at 04/16/2021 0920 Gross per 24 hour  Intake 1453.9 ml  Output  825 ml  Net 628.9 ml   Filed Weights   04/15/21 1400  Weight: 84.8 kg    General appearance: Awake alert.  In no distress Resp: Clear to auscultation bilaterally.  Normal effort Cardio: S1-S2 is tachycardic regular.  No S3-S4.  No rubs or bruit. GI: Abdomen is soft.  Tender in the epigastric area without any rebound rigidity or guarding.  No masses organomegaly. Extremities: No edema.  Full range of motion of lower extremities. Neurologic: Alert and oriented x3.  No focal neurological deficits.      Lab Results:  Data Reviewed: I have personally reviewed labs and imaging study reports  CBC: Recent Labs  Lab 04/13/21 1523 04/14/21 0313 04/15/21 0153 04/15/21 1653 04/16/21 0649  WBC 18.2* 15.3* 15.0* 16.1* 17.2*  NEUTROABS 14.5*  --   --   --   --   HGB 11.1* 9.3* 8.4* 8.9* 8.6*  HCT 34.2* 28.7* 25.8* 27.5* 27.5*  MCV 85.9 86.7 88.1 85.9 89.6  PLT 651* 531* 495* 507* 547*    Basic Metabolic Panel: Recent Labs  Lab 04/13/21 1523 04/13/21 1854 04/14/21 0230 04/15/21 0153 04/16/21 0649  NA 135 130* 135 137 137  K 2.7* 3.4* 2.7* 3.0* 3.6  CL 95* 96* 99 99 103  CO2 25 20* _1 GLUCOSE 106* 89 91 64* 103*  BUN 22* 22* 21* 18 14  CREATININE 2.49* 2.19* 2.10* 1.79* 1.37*  CALCIUM 9.2 7.9* 8.6* 8.4* 8.3*  MG  --  1.6*  --  1.4*  --     GFR: Estimated Creatinine Clearance: 59.1 mL/min (A) (by C-G formula based on SCr of 1.37 mg/dL (H)).  Liver Function Tests: Recent Labs  Lab 04/13/21 1523 04/14/21 0230 04/15/21 0153 04/16/21 0649  AST _2 ALT 35 _3 ALKPHOS 148* 113 98 92  BILITOT 0.4 0.3 0.6 0.3  PROT 7.9 6.5 5.6* 5.8*  ALBUMIN 2.4* 2.0* 1.7* 1.7*    Recent Labs  Lab 04/13/21 1523  LIPASE 36    Coagulation Profile: Recent Labs  Lab 04/13/21 1523 04/15/21 0153 04/16/21 0649  INR 2.4* 1.6* 1.4*     Recent Results (from the past 240 hour(s))  Resp Panel by RT-PCR (Flu A&B, Covid) Nasopharyngeal Swab     Status: None    Collection Time: 04/13/21  3:22 PM   Specimen: Nasopharyngeal Swab; Nasopharyngeal(NP) swabs in vial transport medium  Result Value Ref Range Status   SARS Coronavirus 2 by RT PCR NEGATIVE NEGATIVE Final    Comment: (NOTE) SARS-CoV-2 target nucleic acids are  NOT DETECTED.  The SARS-CoV-2 RNA is generally detectable in upper respiratory specimens during the acute phase of infection. The lowest concentration of SARS-CoV-2 viral copies this assay can detect is 138 copies/mL. A negative result does not preclude SARS-Cov-2 infection and should not be used as the sole basis for treatment or other patient management decisions. A negative result may occur with  improper specimen collection/handling, submission of specimen other than nasopharyngeal swab, presence of viral mutation(s) within the areas targeted by this assay, and inadequate number of viral copies(<138 copies/mL). A negative result must be combined with clinical observations, patient history, and epidemiological information. The expected result is Negative.  Fact Sheet for Patients:  EntrepreneurPulse.com.au  Fact Sheet for Healthcare Providers:  IncredibleEmployment.be  This test is no t yet approved or cleared by the [place] FDA and  has been authorized for detection and/or diagnosis of SARS-CoV-2 by FDA under an Emergency Use Authorization (EUA). This EUA will remain  in effect (meaning this test can be used) for the duration of the COVID-19 declaration under Section 564(b)(1) of the Act, 21 U.S.C.section 360bbb-3(b)(1), unless the authorization is terminated  or revoked sooner.       Influenza A by PCR NEGATIVE NEGATIVE Final   Influenza B by PCR NEGATIVE NEGATIVE Final    Comment: (NOTE) The Xpert Xpress SARS-CoV-2/FLU/RSV plus assay is intended as an aid in the diagnosis of influenza from Nasopharyngeal swab specimens and should not be used as a sole basis for treatment.  Nasal washings and aspirates are unacceptable for Xpert Xpress SARS-CoV-2/FLU/RSV testing.  Fact Sheet for Patients: EntrepreneurPulse.com.au  Fact Sheet for Healthcare Providers: IncredibleEmployment.be  This test is not yet approved or cleared by the [place] FDA and has been authorized for detection and/or diagnosis of SARS-CoV-2 by FDA under an Emergency Use Authorization (EUA). This EUA will remain in effect (meaning this test can be used) for the duration of the COVID-19 declaration under Section 564(b)(1) of the Act, 21 U.S.C. section 360bbb-3(b)(1), unless the authorization is terminated or revoked.  Performed at [facility], [place] [address] [place], [place] [address]   Blood Culture (routine x 2)     Status: None (Preliminary result)   Collection Time: 04/13/21  3:24 PM   Specimen: BLOOD  Result Value Ref Range Status   Specimen Description BLOOD SITE NOT SPECIFIED  Final   Special Requests   Final    BOTTLES DRAWN AEROBIC AND ANAEROBIC Blood Culture adequate volume   Culture   Final    NO GROWTH 3 DAYS Performed at [facility], [address], [place], [place] [address]    Report Status PENDING  Incomplete  Blood Culture (routine x 2)     Status: None (Preliminary result)   Collection Time: 04/13/21  5:55 PM   Specimen: BLOOD  Result Value Ref Range Status   Specimen Description BLOOD LEFT ANTECUBITAL  Final   Special Requests   Final    BOTTLES DRAWN AEROBIC AND ANAEROBIC Blood Culture results may not be optimal due to an excessive volume of blood received in culture bottles   Culture   Final    NO GROWTH 3 DAYS Performed at [facility], [place] [address] [place], [place] [address]    Report Status PENDING  Incomplete  Urine Culture     Status: None   Collection Time: 04/13/21  6:33 PM   Specimen: Urine, Clean Catch  Result Value Ref Range Status   Specimen Description  URINE, CLEAN CATCH  Final    Special Requests NONE  Final   Culture   Final    NO GROWTH Performed at [facility], [place] [address] [place], [place] [address]    Report Status 04/15/2021 FINAL  Final      Radiology Studies: DG Abd 1 View  Result Date: 04/15/2021 CLINICAL DATA:  Confirmation of post pyloric feeding tube placement EXAM: ABDOMEN - 1 VIEW COMPARISON:  None. FINDINGS: The bowel gas pattern is normal. Feeding tube coursing along the expected route of the duodenum with distal tip in the distal duodenum/proximal jejunum. Small amount of intraluminal Gastrografin in the distal duodenum/proximal jejunum. IMPRESSION: Satisfactory positioning of the feeding tube in the distal duodenum/proximal jejunum. Electronically Signed   By: Keane Police D.O.   On: 04/15/2021 16:58       LOS: 3 days   [facility] Pager on [email or web address]  04/16/2021, 10:34 AM

## 2021-04-16 NOTE — Progress Notes (Signed)
Progress Note   Subjective  Chief Complaint: Complicated pancreatitis  Patient NPO except for sips,denies nausea and vomiting, getting TF. Continues to have intermittent abdominal pain, but pain medications help.  Denies any fever or chills overnight. Last bowel movement was 2 days ago.  T Max 97.7   Objective   Vital signs in last 24 hours: Temp:  [97.7 F (36.5 C)-98.9 F (37.2 C)] 97.7 F (36.5 C) (02/04 0734) Pulse Rate:  [89-123] 95 (02/04 0540) Resp:  [18-21] 21 (02/04 0734) BP: (108-139)/(70-96) 108/72 (02/04 0734) SpO2:  [87 %-100 %] 98 % (02/04 0813) FiO2 (%):  [28 %] 28 % (02/04 0813) Weight:  [84.8 kg] 84.8 kg (02/03 1400) Last BM Date: 04/14/21  General:   chronically ill appearing, has core track in place Heart:  regular rate and rhythm, mildly tachycardic while in the room Pulm: Clear anteriorly; no wheezing, on 2 L nasal cannula Abdomen:  Soft, Obese AB, skin exam scars-well-healing lap Coley scars, Normal bowel sounds. mild tenderness in the upper abdomen. Without guarding and Without rebound,  Extremities:  Without edema. Neurologic:  Alert and  oriented x4;  grossly normal neurologically. Psychiatric: Demonstrates good judgement and reason without abnormal affect or behaviors.  Intake/Output from previous day: 02/03 0701 - 02/04 0700 In: 400.1 [I.V.:200.1; IV Piggyback:200] Out: 350 [Urine:350] Intake/Output this shift: Total I/O In: 1053.8 [I.V.:953.8; IV Piggyback:100] Out: 475 [Urine:475]  Lab Results: Recent Labs    04/15/21 0153 04/15/21 1653 04/16/21 0649  WBC 15.0* 16.1* 17.2*  HGB 8.4* 8.9* 8.6*  HCT 25.8* 27.5* 27.5*  PLT 495* 507* 547*   BMET Recent Labs    04/14/21 0230 04/15/21 0153 04/16/21 0649  NA 135 137 137  K 2.7* 3.0* 3.6  CL 99 99 103  CO2 23 25 24   GLUCOSE 91 64* 103*  BUN 21* 18 14  CREATININE 2.10* 1.79* 1.37*  CALCIUM 8.6* 8.4* 8.3*   LFT Recent Labs    04/16/21 0649  PROT 5.8*  ALBUMIN 1.7*  AST  18  ALT 17  ALKPHOS 92  BILITOT 0.3   PT/INR Recent Labs    04/15/21 0153 04/16/21 0649  LABPROT 18.7* 17.0*  INR 1.6* 1.4*    Studies/Results: DG Abd 1 View  Result Date: 04/15/2021 CLINICAL DATA:  Confirmation of post pyloric feeding tube placement EXAM: ABDOMEN - 1 VIEW COMPARISON:  None. FINDINGS: The bowel gas pattern is normal. Feeding tube coursing along the expected route of the duodenum with distal tip in the distal duodenum/proximal jejunum. Small amount of intraluminal Gastrografin in the distal duodenum/proximal jejunum. IMPRESSION: Satisfactory positioning of the feeding tube in the distal duodenum/proximal jejunum. Electronically Signed   By: Keane Police D.O.   On: 04/15/2021 16:58      Impression/Plan:   Post ERCP complicated pancreatitis signs of infection and necrosis. Status post lap chole 12/21, ERCP 12/22 with subsequent ERCP pancreatitis Discussed this case with Dr. Rush Landmark who did not feel that the fluid collection was amenable to endoscopic drainage Consulted IR for drainage of peripancreatic fluid collection, ideal to have 5-day washout of Plavix before proceeding.  They will tentatively planned for the procedure on 04/18/2021. Kidney function improving Continue to monitor daily  Protein malnutrition- mod-severe Due to pancreatitis, complications and decreased PO intake RD consult NJT for postpyloric tube feeds to optimize nutrition   Leukocytosis/ sepsis 04/16/2021 WBC 17.2 slight increase- monitor No fever 04/13/2021 Lactic Acid 1.4  On merrem   [place] Anemia HGB (8.6) 8.4 (9.3) (  11.1)  MCV 89.6  04/16/2021 Iron 16 Ferritin 1,033 B12 254 Get on B12 supplement, can consider iron infusion Possible dilutional without overt bleeding- last BM days ago. Consider CTA if any worsening anemia  AKI/hypokalemia/hypomagnesium BUN 14 Cr 1.37  GFR >60  Potassium 3.6  Magnesium 1.4  IV fluids, replete, monitor. improving  Hypercoagulability INR 2.4--1.6-  1.4 - continue to monitor ? Nutiritional component? Last AB [place] 02/18/21 with diffuse and increased echogenicity, no overt cirrhosis   Recurrent PE On Eliquis, last dose 2/1 Transitioned to heparin by primary team.   History of CVA 2014 On Plavix, last dose 02/01.   Chronic respiratory failure with hypoxia Since COVID-pneumonia, likely component of COPD with smoking history On 2 L [place]   Coronary artery disease with stable angina Continue medications, no chest pain at this time.   Future Appointments  Date Time Provider [place]  04/22/2021  1:30 PM Hunsucker, Bonna Gains, MD [facility] None  04/25/2021 10:30 AM [facility] None  04/25/2021 11:00 AM Marice Potter, MD [facility] None  07/04/2021  9:00 AM [name], MD [name] None      LOS: 3 days   [name]  04/16/2021, 11:55 AM

## 2021-04-16 NOTE — Progress Notes (Signed)
ANTICOAGULATION CONSULT NOTE - Follow Up Consult  Pharmacy Consult for heparin/merrem Indication:  h/o PE /pancreatitis  Labs: Recent Labs    04/13/21 1523 04/13/21 1854 04/14/21 0230 04/14/21 0313 04/14/21 2246 04/15/21 0153 04/15/21 0909 04/15/21 1653 04/15/21 2008 04/16/21 0649  HGB 11.1*  --   --    < >  --  8.4*  --  8.9*  --  8.6*  HCT 34.2*  --   --    < >  --  25.8*  --  27.5*  --  27.5*  PLT 651*  --   --    < >  --  495*  --  507*  --  547*  APTT 52*  --  60*   < > 86*  --  62*  --  69* 74*  LABPROT 25.8*  --   --   --   --  18.7*  --   --   --  17.0*  INR 2.4*  --   --   --   --  1.6*  --   --   --  1.4*  HEPARINUNFRC  --    < > >1.10*  --  >1.10*  --  >1.10*  --   --  0.76*  CREATININE 2.49*   < > 2.10*  --   --  1.79*  --   --   --  1.37*   < > = values in this interval not displayed.     Assessment: 56yo male subtherapeutic on heparin with initial dosing while Eliquis on hold.  PTT came back at 74 this AM. Heparin level is not correlating yet. Scr down 1.37, we will adjust merrem dose.   Goal of Therapy:  Heparin 0.3-0.7 aPTT 66-102 seconds   Plan:  Cont heparin 1650 units / hr Daily PTT/heparin level Monitor CBC and s/sx of bleeding daily Change merrem to 1g IV q8  Onnie Boer, PharmD, BCIDP, AAHIVP, CPP Infectious Disease Pharmacist 04/16/2021 12:31 PM

## 2021-04-16 NOTE — Assessment & Plan Note (Addendum)
Feeding tube has been removed.  He is doing very well with oral intake.  Nutrition Status: Nutrition Problem: Severe Malnutrition Etiology: acute illness (pancreatitis) Signs/Symptoms: moderate muscle depletion, moderate fat depletion, energy intake < or equal to 50% for > or equal to 5 days, percent weight loss (11% weight loss within 3 months) Percent weight loss: 11 % Interventions: Tube feeding

## 2021-04-17 DIAGNOSIS — I10 Essential (primary) hypertension: Secondary | ICD-10-CM | POA: Diagnosis not present

## 2021-04-17 DIAGNOSIS — D649 Anemia, unspecified: Secondary | ICD-10-CM

## 2021-04-17 DIAGNOSIS — Z86711 Personal history of pulmonary embolism: Secondary | ICD-10-CM | POA: Diagnosis not present

## 2021-04-17 DIAGNOSIS — K859 Acute pancreatitis without necrosis or infection, unspecified: Secondary | ICD-10-CM | POA: Diagnosis not present

## 2021-04-17 DIAGNOSIS — E876 Hypokalemia: Secondary | ICD-10-CM | POA: Diagnosis not present

## 2021-04-17 DIAGNOSIS — E43 Unspecified severe protein-calorie malnutrition: Secondary | ICD-10-CM

## 2021-04-17 LAB — COMPREHENSIVE METABOLIC PANEL
ALT: 15 U/L (ref 0–44)
AST: 16 U/L (ref 15–41)
Albumin: 1.5 g/dL — ABNORMAL LOW (ref 3.5–5.0)
Alkaline Phosphatase: 67 U/L (ref 38–126)
Anion gap: 8 (ref 5–15)
BUN: 14 mg/dL (ref 6–20)
CO2: 25 mmol/L (ref 22–32)
Calcium: 7.7 mg/dL — ABNORMAL LOW (ref 8.9–10.3)
Chloride: 105 mmol/L (ref 98–111)
Creatinine, Ser: 1.14 mg/dL (ref 0.61–1.24)
GFR, Estimated: >60 mL/min (ref >60)
Glucose, Bld: 126 mg/dL — ABNORMAL HIGH (ref 70–99)
Potassium: 3.2 mmol/L — ABNORMAL LOW (ref 3.5–5.1)
Sodium: 138 mmol/L (ref 135–145)
Total Bilirubin: 0.2 mg/dL — ABNORMAL LOW (ref 0.3–1.2)
Total Protein: 5.3 g/dL — ABNORMAL LOW (ref 6.5–8.1)

## 2021-04-17 LAB — CBC
HCT: 25.6 % — ABNORMAL LOW (ref 39.0–52.0)
Hemoglobin: 8.3 g/dL — ABNORMAL LOW (ref 13.0–17.0)
MCH: 28.5 pg (ref 26.0–34.0)
MCHC: 32.4 g/dL (ref 30.0–36.0)
MCV: 88 fL (ref 80.0–100.0)
Platelets: 510 10*3/uL — ABNORMAL HIGH (ref 150–400)
RBC: 2.91 MIL/uL — ABNORMAL LOW (ref 4.22–5.81)
RDW: 15.9 % — ABNORMAL HIGH (ref 11.5–15.5)
WBC: 16.9 10*3/uL — ABNORMAL HIGH (ref 4.0–10.5)
nRBC: 0 % (ref 0.0–0.2)

## 2021-04-17 LAB — PROTIME-INR
INR: 1.5 — ABNORMAL HIGH (ref 0.8–1.2)
Prothrombin Time: 17.8 seconds — ABNORMAL HIGH (ref 11.4–15.2)

## 2021-04-17 LAB — GLUCOSE, CAPILLARY
Glucose-Capillary: 109 mg/dL — ABNORMAL HIGH (ref 70–99)
Glucose-Capillary: 109 mg/dL — ABNORMAL HIGH (ref 70–99)
Glucose-Capillary: 112 mg/dL — ABNORMAL HIGH (ref 70–99)
Glucose-Capillary: 112 mg/dL — ABNORMAL HIGH (ref 70–99)
Glucose-Capillary: 126 mg/dL — ABNORMAL HIGH (ref 70–99)
Glucose-Capillary: 137 mg/dL — ABNORMAL HIGH (ref 70–99)

## 2021-04-17 LAB — HEPARIN LEVEL (UNFRACTIONATED): Heparin Unfractionated: 0.28 IU/mL — ABNORMAL LOW (ref 0.30–0.70)

## 2021-04-17 LAB — MAGNESIUM: Magnesium: 1.5 mg/dL — ABNORMAL LOW (ref 1.7–2.4)

## 2021-04-17 LAB — APTT: aPTT: 65 seconds — ABNORMAL HIGH (ref 24–36)

## 2021-04-17 MED ORDER — MAGNESIUM SULFATE 4 GM/100ML IV SOLN
4.0000 g | Freq: Once | INTRAVENOUS | Status: AC
  Administered 2021-04-17: 4 g via INTRAVENOUS
  Filled 2021-04-17: qty 100

## 2021-04-17 NOTE — Plan of Care (Signed)

## 2021-04-17 NOTE — Progress Notes (Signed)
ANTICOAGULATION CONSULT NOTE - Follow Up Consult  Pharmacy Consult for heparin/merrem Indication:  h/o PE /pancreatitis  Labs: Recent Labs    04/15/21 0153 04/15/21 0909 04/15/21 1653 04/15/21 2008 04/16/21 0649 04/17/21 0341  HGB 8.4*  --  8.9*  --  8.6* 8.3*  HCT 25.8*  --  27.5*  --  27.5* 25.6*  PLT 495*  --  507*  --  547* 510*  APTT  --  62*  --  69* 74* 65*  LABPROT 18.7*  --   --   --  17.0* 17.8*  INR 1.6*  --   --   --  1.4* 1.5*  HEPARINUNFRC  --  >1.10*  --   --  0.76* 0.28*  CREATININE 1.79*  --   --   --  1.37*  --      Assessment: 55yo male subtherapeutic on heparin with initial dosing while Eliquis on hold.  PTT came back at 65 and HL 0.28 this AM. Levels are correlating now. Scr down 1.37.  D5 abx. Plan for drain placement Monday.   Goal of Therapy:  Heparin 0.3-0.7 aPTT 66-102 seconds   Plan:  Increase heparin 17000 units / hr Daily PTT/heparin level Monitor CBC and s/sx of bleeding daily F/u with apixaban resume Cont merrem to 1g IV q8  Onnie Boer, PharmD, BCIDP, AAHIVP, CPP Infectious Disease Pharmacist 04/17/2021 7:19 AM

## 2021-04-17 NOTE — Progress Notes (Signed)
Progress Note   Subjective  Chief Complaint: Complicated pancreatitis  Patient NPO except for sips,denies nausea and vomiting, getting TF which is tolerating, no N/V.Scott Foley Continues to have intermittent abdominal pain, has not had pain meds since 12 last night. Denies any fever or chills overnight. Last bowel movement 3 AM and another recent, they are loose, no melena/hematochezia.   Objective   Vital signs in last 24 hours: Temp:  [97.6 F (36.4 C)-98.3 F (36.8 C)] 98.3 F (36.8 C) (02/05 0445) Pulse Rate:  [99-107] 105 (02/05 0935) Resp:  [20-25] 20 (02/04 2036) BP: (91-113)/(60-82) 113/82 (02/05 0935) SpO2:  [97 %-98 %] 97 % (02/05 0808) FiO2 (%):  [32 %] 32 % (02/05 0808) Last BM Date: 04/17/21 (this morning per pt)  General:   chronically ill appearing, has core track in place Heart:  regular rate and rhythm, mildly tachycardic while in the room Pulm: Clear anteriorly; no wheezing, on 2 L nasal cannula Abdomen:  Soft, Obese AB, skin exam scars-well-healing lap Coley scars, Normal bowel sounds. mild tenderness in the upper abdomen. Without guarding and Without rebound,  Extremities:  Without edema. Neurologic:  Alert and  oriented x4;  grossly normal neurologically. Psychiatric: Demonstrates good judgement and reason without abnormal affect or behaviors.  Intake/Output from previous day: 02/04 0701 - 02/05 0700 In: 3567.7 [P.O.:50; I.V.:3038.9; NG/GT:78.8; IV Piggyback:400] Out: 827 [Urine:826; Stool:1] Intake/Output this shift: No intake/output data recorded.  Lab Results: Recent Labs    04/15/21 1653 04/16/21 0649 04/17/21 0341  WBC 16.1* 17.2* 16.9*  HGB 8.9* 8.6* 8.3*  HCT 27.5* 27.5* 25.6*  PLT 507* 547* 510*   BMET Recent Labs    04/15/21 0153 04/16/21 0649 04/17/21 0637  NA 137 137 138  K 3.0* 3.6 3.2*  CL 99 103 105  CO2 25 24 25   GLUCOSE 64* 103* 126*  BUN 18 14 14   CREATININE 1.79* 1.37* 1.14  CALCIUM 8.4* 8.3* 7.7*   LFT Recent Labs     04/17/21 0637  PROT 5.3*  ALBUMIN 1.5*  AST 16  ALT 15  ALKPHOS 67  BILITOT 0.2*   PT/INR Recent Labs    04/16/21 0649 04/17/21 0341  LABPROT 17.0* 17.8*  INR 1.4* 1.5*    Studies/Results: DG Abd 1 View  Result Date: 04/15/2021 CLINICAL DATA:  Confirmation of post pyloric feeding tube placement EXAM: ABDOMEN - 1 VIEW COMPARISON:  None. FINDINGS: The bowel gas pattern is normal. Feeding tube coursing along the expected route of the duodenum with distal tip in the distal duodenum/proximal jejunum. Small amount of intraluminal Gastrografin in the distal duodenum/proximal jejunum. IMPRESSION: Satisfactory positioning of the feeding tube in the distal duodenum/proximal jejunum. Electronically Signed   By: Keane Police D.O.   On: 04/15/2021 16:58      Impression/Plan:   Post ERCP complicated pancreatitis signs of infection and necrosis. Status post lap chole 12/21, ERCP 12/22 with subsequent ERCP pancreatitis Discussed this case with Dr. Rush Landmark NOT amenable to endoscopic drainage IR planning drainage of peripancreatic fluid collection on 02/06/202, after 5 day wash out. Kidney function improving Continue to monitor daily  Protein malnutrition- mod-severe Due to pancreatitis, complications and decreased PO intake RD consult NJT for postpyloric tube feeds to optimize nutrition- tolerating well  Loose stools Possible from TF Monitor-Can consider trial of creon if continues versus if greater than 3 a day Cdiff   Leukocytosis/ sepsis 04/17/2021 WBC 16.9 (17.2) 04/13/2021 Lactic Acid 1.4  Blood pressure 113/82, pulse (!) 105, temperature 98.3  F (36.8 C), temperature source Oral, resp. rate 20, height 5\' 4"  (1.626 m), weight 84.8 kg, SpO2 97 %. On merrem   [place] Anemia HGB 8.3 (8.6) (8.4) (9.3) (11.1) MCV 88.0  04/16/2021 Iron 16 Ferritin 1,033 B12 254 Get on B12 supplement, consider iron infusion Possible dilutional without overt bleeding- last BM days ago. Consider CTA if  any worsening anemia  AKI/hypokalemia/hypomagnesium BUN 14 Cr 1.14  GFR >60  Potassium 3.2  Magnesium 1.4  IV fluids, replete Mag/K+, monitor.  Hypercoagulability- decreasing INR 2.4--1.6- 1.4 - continue to monitor ? Nutiritional component? Last AB [place] 02/18/21 with diffuse and increased echogenicity, no overt cirrhosis   Recurrent PE On Eliquis, last dose 2/1 Transitioned to heparin by primary team.   History of CVA 2014 On Plavix, last dose 02/01.   Chronic respiratory failure with hypoxia Since COVID-pneumonia, likely component of COPD with smoking history On 2 L [place]   Coronary artery disease with stable angina Continue medications, no chest pain at this time.   Future Appointments  Date Time Provider [place]  04/22/2021  1:30 PM Hunsucker, Bonna Gains, MD [facility] None  04/25/2021 10:30 AM [facility] None  04/25/2021 11:00 AM Marice Potter, MD [facility] None  07/04/2021  9:00 AM [name], MD [name] None      LOS: 4 days   Scott Foley  04/17/2021, 10:02 AM

## 2021-04-17 NOTE — Plan of Care (Signed)

## 2021-04-17 NOTE — Progress Notes (Signed)
[facility] PROGRESS NOTE   Scott Foley GUR:[id number] DOB: [birth year] DOA: 04/13/2021  4 DOS: the patient was seen and examined on 04/17/2021  PCP: [name], MD  Brief History and Hospital Course:  56 y.o. male with medical history significant of hx of CAD, HTN, hx of PE on eliquis, HLD, hxo of CVA in 2014, IDA with history of recent hospitalizaion on 03/16/21 for recurrent pancreatitis, sepsis due to bacteremia from klebsiella pneumoniae, covid-19 and respiratory failure requiring home oxygen on discharge.  Presented with a 4-day history of diffuse worsening abdominal pain.  He has had abdominal issues for the past 2 months.  He had a cholecystectomy on 12/21 and underwent ERCP on 12/22 since the intraoperative cholangiogram showed a CBD stone.  After ERCP he developed pancreatitis.  He was managed conservatively and then discharged home on 12/28.  He was readmitted to [facility] on 1/4 for recurrent pancreatitis and COVID-19.  Noted to have sepsis respiratory failure.  Blood cultures were positive for Klebsiella pneumonia.  He was treated with Invanz for 10 days.  Discharged on 2 L of oxygen. He had a postop visit with surgeon on 04/12/21 when he complained of abdominal pain.  A CT scan was subsequently done which showed pancreatic necrosis with gas within the pancreatic fluid collections.  He was sent over to [facility] emergency department.  He was seen by general surgery.  Hospitalized for further management.  Placed on antibiotics.  Also noted to have acute kidney injury.  Denied any significant alcohol use. Patient is seen imaging and surgery as well as gastroenterology.  Interventional radiology consulted for aspiration of the fluid collection.  Consultants: General surgery.  Gastroenterology.  Interventional radiology  Procedures: None    Subjective: Pain is well controlled.  Denies any shortness of breath chest pain nausea or vomiting.  Urinating.  Passing gas from  below.  Had a bowel movement this morning.   Assessment/Plan:   * Severe acute pancreatitis with sepsis - (present on admission) This is either a third episode of pancreatitis or sequelae of pancreatitis from his episode in January.  His lipase level was noted to be normal.  He does have significant leukocytosis.  CT scan was done at [facility].  Do not have access to that report but according to the H&P it showed progressive severe pancreatitis with extensive fluid collection surrounding the pancreas and extending into the right retroperitoneum.  Many of these fluid collections containing gas concerning for infection.  Compression of splenic vein and SMV was noted although they appear to be patent.  Patient was started on meropenem.   Gastroenterology and general surgery have been consulted.  No role for endoscopic drainage at this time.  Interventional radiology consulted for percutaneous drainage which is scheduled for 2/6 to allow Plavix washout. Patient is stable for the most part.  Sepsis ([place])- (present on admission) On admission he met sepsis criteria with tachycardia, fever to 100.8, WBC to 18.2.  Recently noted to have Klebsiella bacteremia earlier in January when he was hospitalized in [place].  Blood cultures done here in the hospital have been negative so far. WBC noted to be high.  He is afebrile.  Hypokalemia- (present on admission) Hypomagnesemia  Will replete potassium today.  Check magnesium.  Likely from GI losses initially.  AKI (acute kidney injury) ([place])- (present on admission) Presented with creatinine of 2.49. This is likely prerenal in the setting of sepsis along with nausea and vomiting over the past few days.  Patient aggressively hydrated.  Renal function has improved and back to normal now.  Monitor urine output.    Normocytic anemia- (present on admission) Significant drop in hemoglobin noted most of which appears to be dilutional.   Anemia panel reviewed.   Vitamin B12 noted to be low normal at 254.  Being supplemented.  Folate 8.2.  Ferritin 1033.  TIBC 137.  Iron 16. Hemoglobin low but stable.  No evidence of overt bleeding.  History of pulmonary embolism- (present on admission) Eliquis is currently on hold.  He is on heparin infusion.    Chronic respiratory failure with hypoxia ([place])- (present on admission) Recently recovered from COVID-19 infection.  Was discharged on 2 L of oxygen by nasal cannula.  Chest x-ray continues to show opacities which could be from his recent infection rather than any infection.   Respiratory status stable for the most part.  Continue to monitor.  Chronic obstructive pulmonary disease ([place])- (present on admission) No signs of exacerbation. Continue trelegy daily and albuterol prn.  Essential hypertension- (present on admission) Sinus tachycardia  ACE inhibitor on hold.  Borderline low blood pressures noted.  Sinus tachycardia likely secondary to severe illness.  Heart rate is better.  He was started back on beta-blocker due to concern for rebound tachycardia.    Hyperlipemia- (present on admission) Statin on hold currently.  Triglyceride was 121 in December.  Coronary artery disease with stable angina pectoris ([place])- (present on admission) Stable.  Plavix currently on hold.   Protein-calorie malnutrition, severe Started on tube feedings.  Hypoglycemia Resolved with initiation of fluids and nutrition.  Cut back on IV fluids.     Obesity Estimated body mass index is 32.1 kg/m as calculated from the following:   Height as of this encounter: 5' 4" (1.626 m).   Weight as of this encounter: 84.8 kg.   DVT Prophylaxis: On IV heparin Code Status: Full code Family Communication: Discussed with patient Disposition Plan: To be determined  Status is: Inpatient Remains inpatient appropriate because: Sepsis with pancreatic necrosis with infection        Medications: Scheduled:  feeding supplement  (PROSource TF)  45 mL Per Tube BID   fluticasone furoate-vilanterol  1 puff Inhalation Daily   And   umeclidinium bromide  1 puff Inhalation Daily   free water  30 mL Per Tube Q4H   metoprolol tartrate  25 mg Oral BID   pantoprazole  40 mg Oral BID   vitamin B-12  1,000 mcg Per Tube Daily   Continuous:  dextrose 5 % and 0.9% NaCl 100 mL/hr at 04/17/21 0522   feeding supplement (VITAL 1.5 CAL) 1,000 mL (04/17/21 0936)   heparin 1,700 Units/hr (04/17/21 0813)   meropenem (MERREM) IV 1 g (04/17/21 0753)   KWI:[id number], albuterol, morphine injection, ondansetron **OR** ondansetron (ZOFRAN) IV, oxyCODONE  Antibiotics: Anti-infectives (From admission, onward)    Start     Dose/Rate Route Frequency Ordered Stop   04/16/21 1600  meropenem (MERREM) 1 g in sodium chloride 0.9 % 100 mL IVPB        1 g 200 mL/hr over 30 Minutes Intravenous Every 8 hours 04/16/21 1234     04/13/21 2000  meropenem (MERREM) 1 g in sodium chloride 0.9 % 100 mL IVPB  Status:  Discontinued        1 g 200 mL/hr over 30 Minutes Intravenous Every 12 hours 04/13/21 1934 04/16/21 1234   04/13/21 1800  ceFEPIme (MAXIPIME) 2 g in sodium chloride 0.9 % 100  mL IVPB  Status:  Discontinued        2 g 200 mL/hr over 30 Minutes Intravenous Every 12 hours 04/13/21 1731 04/13/21 1852   04/13/21 1715  levofloxacin (LEVAQUIN) IVPB 750 mg  Status:  Discontinued        750 mg 100 mL/hr over 90 Minutes Intravenous  Once 04/13/21 1713 04/13/21 1728   04/13/21 1715  metroNIDAZOLE (FLAGYL) IVPB 500 mg  Status:  Discontinued        500 mg 100 mL/hr over 60 Minutes Intravenous  Once 04/13/21 1713 04/13/21 1852       Objective:  Vital Signs  Vitals:   04/16/21 2036 04/17/21 0445 04/17/21 0808 04/17/21 0935  BP: 113/77 109/69  113/82  Pulse: 99 (!) 105  (!) 105  Resp: 20     Temp: 97.7 F (36.5 C) 98.3 F (36.8 C)    TempSrc: Oral Oral    SpO2: 98% 98% 97%   Weight:      Height:        Intake/Output Summary (Last  24 hours) at 04/17/2021 1005 Last data filed at 04/17/2021 0522 Gross per 24 hour  Intake 2393.9 ml  Output 352 ml  Net 2041.9 ml    Filed Weights   04/15/21 1400  Weight: 84.8 kg    General appearance: Awake alert.  In no distress Resp: Clear to auscultation bilaterally.  Normal effort Cardio: S1-S2 is normal regular.  No S3-S4.  No rubs murmurs or bruit GI: Abdomen is soft.  Tender in the epigastric area without any rebound rigidity or guarding.  No masses organomegaly.  Bowel sounds heard. Extremities: No edema.  Full range of motion of lower extremities. Neurologic: Alert and oriented x3.  No focal neurological deficits.       Lab Results:  Data Reviewed: I have personally reviewed labs and imaging study reports  CBC: Recent Labs  Lab 04/13/21 1523 04/14/21 0313 04/15/21 0153 04/15/21 1653 04/16/21 0649 04/17/21 0341  WBC 18.2* 15.3* 15.0* 16.1* 17.2* 16.9*  NEUTROABS 14.5*  --   --   --   --   --   HGB 11.1* 9.3* 8.4* 8.9* 8.6* 8.3*  HCT 34.2* 28.7* 25.8* 27.5* 27.5* 25.6*  MCV 85.9 86.7 88.1 85.9 89.6 88.0  PLT 651* 531* 495* 507* 547* 510*     Basic Metabolic Panel: Recent Labs  Lab 04/13/21 1854 04/14/21 0230 04/15/21 0153 04/16/21 0649 04/17/21 0637  NA 130* 135 137 137 138  K 3.4* 2.7* 3.0* 3.6 3.2*  CL 96* 99 99 103 105  CO2 20* _0 GLUCOSE 89 91 64* 103* 126*  BUN 22* 21* _1 CREATININE 2.19* 2.10* 1.79* 1.37* 1.14  CALCIUM 7.9* 8.6* 8.4* 8.3* 7.7*  MG 1.6*  --  1.4*  --   --      GFR: Estimated Creatinine Clearance: 71 mL/min (by C-G formula based on SCr of 1.14 mg/dL).  Liver Function Tests: Recent Labs  Lab 04/13/21 1523 04/14/21 0230 04/15/21 0153 04/16/21 0649 04/17/21 0637  AST _2 ALT 35 _3 ALKPHOS 148* 113 98 92 67  BILITOT 0.4 0.3 0.6 0.3 0.2*  PROT 7.9 6.5 5.6* 5.8* 5.3*  ALBUMIN 2.4* 2.0* 1.7* 1.7* 1.5*     Recent Labs  Lab 04/13/21 1523  LIPASE 36     Coagulation  Profile: Recent Labs  Lab 04/13/21 1523 04/15/21 0153 04/16/21 0649 04/17/21 0341  INR  2.4* 1.6* 1.4* 1.5*      Recent Results (from the past 240 hour(s))  Resp Panel by RT-PCR (Flu A&B, Covid) Nasopharyngeal Swab     Status: None   Collection Time: 04/13/21  3:22 PM   Specimen: Nasopharyngeal Swab; Nasopharyngeal(NP) swabs in vial transport medium  Result Value Ref Range Status   SARS Coronavirus 2 by RT PCR NEGATIVE NEGATIVE Final    Comment: (NOTE) SARS-CoV-2 target nucleic acids are NOT DETECTED.  The SARS-CoV-2 RNA is generally detectable in upper respiratory specimens during the acute phase of infection. The lowest concentration of SARS-CoV-2 viral copies this assay can detect is 138 copies/mL. A negative result does not preclude SARS-Cov-2 infection and should not be used as the sole basis for treatment or other patient management decisions. A negative result may occur with  improper specimen collection/handling, submission of specimen other than nasopharyngeal swab, presence of viral mutation(s) within the areas targeted by this assay, and inadequate number of viral copies(<138 copies/mL). A negative result must be combined with clinical observations, patient history, and epidemiological information. The expected result is Negative.  Fact Sheet for Patients:  EntrepreneurPulse.com.au  Fact Sheet for Healthcare Providers:  IncredibleEmployment.be  This test is no t yet approved or cleared by the [place] FDA and  has been authorized for detection and/or diagnosis of SARS-CoV-2 by FDA under an Emergency Use Authorization (EUA). This EUA will remain  in effect (meaning this test can be used) for the duration of the COVID-19 declaration under Section 564(b)(1) of the Act, 21 U.S.C.section 360bbb-3(b)(1), unless the authorization is terminated  or revoked sooner.       Influenza A by PCR NEGATIVE NEGATIVE Final   Influenza  B by PCR NEGATIVE NEGATIVE Final    Comment: (NOTE) The Xpert Xpress SARS-CoV-2/FLU/RSV plus assay is intended as an aid in the diagnosis of influenza from Nasopharyngeal swab specimens and should not be used as a sole basis for treatment. Nasal washings and aspirates are unacceptable for Xpert Xpress SARS-CoV-2/FLU/RSV testing.  Fact Sheet for Patients: EntrepreneurPulse.com.au  Fact Sheet for Healthcare Providers: IncredibleEmployment.be  This test is not yet approved or cleared by the [place] FDA and has been authorized for detection and/or diagnosis of SARS-CoV-2 by FDA under an Emergency Use Authorization (EUA). This EUA will remain in effect (meaning this test can be used) for the duration of the COVID-19 declaration under Section 564(b)(1) of the Act, 21 U.S.C. section 360bbb-3(b)(1), unless the authorization is terminated or revoked.  Performed at [facility], [place] [address], [place], [place] [address]   Blood Culture (routine x 2)     Status: None (Preliminary result)   Collection Time: 04/13/21  3:24 PM   Specimen: BLOOD  Result Value Ref Range Status   Specimen Description BLOOD SITE NOT SPECIFIED  Final   Special Requests   Final    BOTTLES DRAWN AEROBIC AND ANAEROBIC Blood Culture adequate volume   Culture   Final    NO GROWTH 3 DAYS Performed at [facility], [address], [place], [place] [address]    Report Status PENDING  Incomplete  Blood Culture (routine x 2)     Status: None (Preliminary result)   Collection Time: 04/13/21  5:55 PM   Specimen: BLOOD  Result Value Ref Range Status   Specimen Description BLOOD LEFT ANTECUBITAL  Final   Special Requests   Final    BOTTLES DRAWN AEROBIC AND ANAEROBIC Blood Culture results may not be optimal  due to an excessive volume of blood received in culture bottles   Culture   Final    NO GROWTH 3 DAYS Performed at [facility], [place] [address]  [place], [place] [address]    Report Status PENDING  Incomplete  Urine Culture     Status: None   Collection Time: 04/13/21  6:33 PM   Specimen: Urine, Clean Catch  Result Value Ref Range Status   Specimen Description URINE, CLEAN CATCH  Final   Special Requests NONE  Final   Culture   Final    NO GROWTH Performed at [facility], [place] [address], [place], [place] [address]    Report Status 04/15/2021 FINAL  Final       Radiology Studies: DG Abd 1 View  Result Date: 04/15/2021 CLINICAL DATA:  Confirmation of post pyloric feeding tube placement EXAM: ABDOMEN - 1 VIEW COMPARISON:  None. FINDINGS: The bowel gas pattern is normal. Feeding tube coursing along the expected route of the duodenum with distal tip in the distal duodenum/proximal jejunum. Small amount of intraluminal Gastrografin in the distal duodenum/proximal jejunum. IMPRESSION: Satisfactory positioning of the feeding tube in the distal duodenum/proximal jejunum. Electronically Signed   By: Keane Police D.O.   On: 04/15/2021 16:58       LOS: 4 days   [facility] Pager on [email or web address]  04/17/2021, 10:05 AM

## 2021-04-17 NOTE — Plan of Care (Signed)
  Problem: Safety: Goal: Ability to remain free from injury will improve Outcome: Progressing   Problem: Skin Integrity: Goal: Risk for impaired skin integrity will decrease Outcome: Progressing   

## 2021-04-17 NOTE — Progress Notes (Signed)
Patient ID: Scott Foley, male   DOB: 02/28/1966, 56 y.o.   MRN: [id number] [facility] Surgery Progress Note     Subjective: CC-  NAEO. Denies pain currently. Tolerating TF. Having BMs.   WBC 16.9 fron 17.2, afebrile.  Objective: Vital signs in last 24 hours: Temp:  [97.6 F (36.4 C)-98.3 F (36.8 C)] 98.3 F (36.8 C) (02/05 0445) Pulse Rate:  [99-107] 105 (02/05 0445) Resp:  [20-25] 20 (02/04 2036) BP: (91-113)/(60-77) 109/69 (02/05 0445) SpO2:  [97 %-98 %] 97 % (02/05 0808) FiO2 (%):  [32 %] 32 % (02/05 0808) Last BM Date: 04/17/21 (this morning per pt)  Intake/Output from previous day: 02/04 0701 - 02/05 0700 In: 3567.7 [P.O.:50; I.V.:3038.9; NG/GT:78.8; IV Piggyback:400] Out: 827 [Urine:826; Stool:1] Intake/Output this shift: No intake/output data recorded.  PE: Gen:  Alert, NAD, pleasant Card: rrr Pulm:  rate and effort normal Abd: Soft, ND, mild TTP, worse in upper abdomen, no peritonitis   Lab Results:  Recent Labs    04/16/21 0649 04/17/21 0341  WBC 17.2* 16.9*  HGB 8.6* 8.3*  HCT 27.5* 25.6*  PLT 547* 510*   BMET Recent Labs    04/16/21 0649 04/17/21 0637  NA 137 138  K 3.6 3.2*  CL 103 105  CO2 24 25  GLUCOSE 103* 126*  BUN 14 14  CREATININE 1.37* 1.14  CALCIUM 8.3* 7.7*   PT/INR Recent Labs    04/16/21 0649 04/17/21 0341  LABPROT 17.0* 17.8*  INR 1.4* 1.5*   CMP     Component Value Date/Time   NA 138 04/17/2021 0637   NA 140 04/01/2021 1047   K 3.2 (L) 04/17/2021 0637   CL 105 04/17/2021 0637   CO2 25 04/17/2021 0637   GLUCOSE 126 (H) 04/17/2021 0637   BUN 14 04/17/2021 0637   BUN 24 04/01/2021 1047   CREATININE 1.14 04/17/2021 0637   CALCIUM 7.7 (L) 04/17/2021 0637   PROT 5.3 (L) 04/17/2021 0637   PROT 6.5 04/01/2021 1047   ALBUMIN 1.5 (L) 04/17/2021 0637   ALBUMIN 3.1 (L) 04/01/2021 1047   AST 16 04/17/2021 0637   ALT 15 04/17/2021 0637   ALKPHOS 67 04/17/2021 0637   BILITOT 0.2 (L) 04/17/2021 0637    BILITOT 0.6 04/01/2021 1047   GFRNONAA >60 04/17/2021 0637   GFRAA 105 12/15/2019 1034   Lipase     Component Value Date/Time   LIPASE 36 04/13/2021 1523       Studies/Results: DG Abd 1 View  Result Date: 04/15/2021 CLINICAL DATA:  Confirmation of post pyloric feeding tube placement EXAM: ABDOMEN - 1 VIEW COMPARISON:  None. FINDINGS: The bowel gas pattern is normal. Feeding tube coursing along the expected route of the duodenum with distal tip in the distal duodenum/proximal jejunum. Small amount of intraluminal Gastrografin in the distal duodenum/proximal jejunum. IMPRESSION: Satisfactory positioning of the feeding tube in the distal duodenum/proximal jejunum. Electronically Signed   By: Keane Police D.O.   On: 04/15/2021 16:58    Anti-infectives: Anti-infectives (From admission, onward)    Start     Dose/Rate Route Frequency Ordered Stop   04/16/21 1600  meropenem (MERREM) 1 g in sodium chloride 0.9 % 100 mL IVPB        1 g 200 mL/hr over 30 Minutes Intravenous Every 8 hours 04/16/21 1234     04/13/21 2000  meropenem (MERREM) 1 g in sodium chloride 0.9 % 100 mL IVPB  Status:  Discontinued  1 g 200 mL/hr over 30 Minutes Intravenous Every 12 hours 04/13/21 1934 04/16/21 1234   04/13/21 1800  ceFEPIme (MAXIPIME) 2 g in sodium chloride 0.9 % 100 mL IVPB  Status:  Discontinued        2 g 200 mL/hr over 30 Minutes Intravenous Every 12 hours 04/13/21 1731 04/13/21 1852   04/13/21 1715  levofloxacin (LEVAQUIN) IVPB 750 mg  Status:  Discontinued        750 mg 100 mL/hr over 90 Minutes Intravenous  Once 04/13/21 1713 04/13/21 1728   04/13/21 1715  metroNIDAZOLE (FLAGYL) IVPB 500 mg  Status:  Discontinued        500 mg 100 mL/hr over 60 Minutes Intravenous  Once 04/13/21 1713 04/13/21 1852        Assessment/Plan Post-ERCP pancreatitis, now with signs of infected necrosis  - s/p lap chole 03/02/21 by Dr. Lilia Pro with abnormal IOC >> underwent ERCP 03/03/21  - IR plans not to  drain until Monday 2/6 - hold anticoag per IR -  Continue IV antibiotics.  - continue tube feeds; I have ordered for TF to be held a MN for planning IR procedure tomorrow     ID - merrem 2/1>> FEN - NPO, PP TF  VTE - hold plavix/ eliquis, on heparin gtt - to be held by IR 2-4 hrs pre-procedure. Foley - none   CAD HTN HLD COPD Hx PE ~3 years ago on eliquis (last dose 2/1) Hx CVA in 2014 on plavix (last dose 2/1) IDA Tobacco abuse - quit smoking 5 months ago  Moderate Medical Decision Making   LOS: 4 days   Nadeen Landau, MD [facility] Surgery, [place]

## 2021-04-18 ENCOUNTER — Inpatient Hospital Stay (HOSPITAL_COMMUNITY): Payer: Medicare HMO

## 2021-04-18 DIAGNOSIS — K859 Acute pancreatitis without necrosis or infection, unspecified: Secondary | ICD-10-CM | POA: Diagnosis not present

## 2021-04-18 DIAGNOSIS — I10 Essential (primary) hypertension: Secondary | ICD-10-CM | POA: Diagnosis not present

## 2021-04-18 DIAGNOSIS — K8591 Acute pancreatitis with uninfected necrosis, unspecified: Secondary | ICD-10-CM | POA: Diagnosis not present

## 2021-04-18 DIAGNOSIS — E876 Hypokalemia: Secondary | ICD-10-CM | POA: Diagnosis not present

## 2021-04-18 DIAGNOSIS — Z86711 Personal history of pulmonary embolism: Secondary | ICD-10-CM | POA: Diagnosis not present

## 2021-04-18 LAB — COMPREHENSIVE METABOLIC PANEL
ALT: 17 U/L (ref 0–44)
AST: 24 U/L (ref 15–41)
Albumin: <1.5 g/dL — ABNORMAL LOW (ref 3.5–5.0)
Alkaline Phosphatase: 77 U/L (ref 38–126)
Anion gap: 8 (ref 5–15)
BUN: 14 mg/dL (ref 6–20)
CO2: 25 mmol/L (ref 22–32)
Calcium: 7.3 mg/dL — ABNORMAL LOW (ref 8.9–10.3)
Chloride: 102 mmol/L (ref 98–111)
Creatinine, Ser: 1.05 mg/dL (ref 0.61–1.24)
GFR, Estimated: >60 mL/min (ref >60)
Glucose, Bld: 88 mg/dL (ref 70–99)
Potassium: 2.8 mmol/L — ABNORMAL LOW (ref 3.5–5.1)
Sodium: 135 mmol/L (ref 135–145)
Total Bilirubin: 0.4 mg/dL (ref 0.3–1.2)
Total Protein: 4.8 g/dL — ABNORMAL LOW (ref 6.5–8.1)

## 2021-04-18 LAB — GLUCOSE, CAPILLARY
Glucose-Capillary: 108 mg/dL — ABNORMAL HIGH (ref 70–99)
Glucose-Capillary: 115 mg/dL — ABNORMAL HIGH (ref 70–99)
Glucose-Capillary: 121 mg/dL — ABNORMAL HIGH (ref 70–99)
Glucose-Capillary: 85 mg/dL (ref 70–99)
Glucose-Capillary: 94 mg/dL (ref 70–99)
Glucose-Capillary: 96 mg/dL (ref 70–99)

## 2021-04-18 LAB — CULTURE, BLOOD (ROUTINE X 2)
Culture: NO GROWTH
Culture: NO GROWTH
Special Requests: ADEQUATE

## 2021-04-18 LAB — HEPARIN LEVEL (UNFRACTIONATED)
Heparin Unfractionated: <0.1 IU/mL — ABNORMAL LOW (ref 0.30–0.70)
Heparin Unfractionated: <0.1 IU/mL — ABNORMAL LOW (ref 0.30–0.70)

## 2021-04-18 LAB — CBC
HCT: 23.3 % — ABNORMAL LOW (ref 39.0–52.0)
Hemoglobin: 7.6 g/dL — ABNORMAL LOW (ref 13.0–17.0)
MCH: 28.4 pg (ref 26.0–34.0)
MCHC: 32.6 g/dL (ref 30.0–36.0)
MCV: 86.9 fL (ref 80.0–100.0)
Platelets: 396 10*3/uL (ref 150–400)
RBC: 2.68 MIL/uL — ABNORMAL LOW (ref 4.22–5.81)
RDW: 15.7 % — ABNORMAL HIGH (ref 11.5–15.5)
WBC: 15 10*3/uL — ABNORMAL HIGH (ref 4.0–10.5)
nRBC: 0 % (ref 0.0–0.2)

## 2021-04-18 LAB — C-REACTIVE PROTEIN: CRP: 15.9 mg/dL — ABNORMAL HIGH (ref <1.0)

## 2021-04-18 LAB — MAGNESIUM: Magnesium: 2.1 mg/dL (ref 1.7–2.4)

## 2021-04-18 MED ORDER — MIDAZOLAM HCL 2 MG/2ML IJ SOLN
INTRAMUSCULAR | Status: AC
  Filled 2021-04-18: qty 4

## 2021-04-18 MED ORDER — POTASSIUM CHLORIDE 20 MEQ PO PACK: 40.0000 meq | PACK | ORAL | Status: DC

## 2021-04-18 MED ORDER — IOHEXOL 300 MG/ML  SOLN: 100.0000 mL | Freq: Once | INTRAMUSCULAR | Status: DC

## 2021-04-18 MED ORDER — MIDAZOLAM HCL 2 MG/2ML IJ SOLN
INTRAMUSCULAR | Status: AC | PRN
Start: 2021-04-18 — End: 2021-04-18
  Administered 2021-04-18 (×2): 1 mg via INTRAVENOUS

## 2021-04-18 MED ORDER — IOHEXOL 300 MG/ML  SOLN
75.0000 mL | Freq: Once | INTRAMUSCULAR | Status: AC | PRN
  Administered 2021-04-18: 75 mL via INTRAVENOUS

## 2021-04-18 MED ORDER — SODIUM CHLORIDE 0.9% FLUSH
5.0000 mL | Freq: Three times a day (TID) | INTRAVENOUS | Status: DC
  Administered 2021-04-18 – 2021-05-10 (×54): 5 mL

## 2021-04-18 MED ORDER — FENTANYL CITRATE (PF) 100 MCG/2ML IJ SOLN
INTRAMUSCULAR | Status: AC | PRN
  Administered 2021-04-18 (×2): 50 ug via INTRAVENOUS

## 2021-04-18 MED ORDER — HEPARIN (PORCINE) 25000 UT/250ML-% IV SOLN
2550.0000 [IU]/h | INTRAVENOUS | Status: DC
  Administered 2021-04-18: 1700 [IU]/h via INTRAVENOUS
  Administered 2021-04-19: 2150 [IU]/h via INTRAVENOUS
  Administered 2021-04-20 – 2021-04-26 (×13): 2400 [IU]/h via INTRAVENOUS
  Administered 2021-04-27 – 2021-05-01 (×9): 2550 [IU]/h via INTRAVENOUS
  Filled 2021-04-18 (×26): qty 250

## 2021-04-18 MED ORDER — POTASSIUM CHLORIDE 20 MEQ PO PACK
40.0000 meq | PACK | ORAL | Status: AC
  Administered 2021-04-18 (×3): 40 meq via ORAL
  Filled 2021-04-18 (×3): qty 2

## 2021-04-18 MED ORDER — LIDOCAINE-EPINEPHRINE 1 %-1:100000 IJ SOLN
10.0000 mL | Freq: Once | INTRAMUSCULAR | Status: AC
  Filled 2021-04-18: qty 10

## 2021-04-18 MED ORDER — LIDOCAINE-EPINEPHRINE 1 %-1:100000 IJ SOLN
INTRAMUSCULAR | Status: AC
  Administered 2021-04-18: 10 mL via INTRADERMAL
  Filled 2021-04-18: qty 1

## 2021-04-18 MED ORDER — FENTANYL CITRATE (PF) 100 MCG/2ML IJ SOLN
INTRAMUSCULAR | Status: AC
  Filled 2021-04-18: qty 4

## 2021-04-18 NOTE — Progress Notes (Signed)
Patient alert and oriented, no acute distress noted currently. Upon assessment, vital signs are stable. Will continue to monitor closely.  04/18/21 2018  Assess: MEWS Score  Temp 98 F (36.7 C)  BP 117/73  Resp 18  Level of Consciousness Alert  SpO2 96 %  O2 Device Nasal Cannula  O2 Flow Rate (L/min) 2 L/min  Assess: MEWS Score  MEWS Temp 0  MEWS Systolic 0  MEWS Pulse 1  MEWS RR 0  MEWS LOC 0  MEWS Score 1  MEWS Score Color Scott Foley

## 2021-04-18 NOTE — Procedures (Signed)
Pre procedural Dx: Pancreatic pseudocyst Post procedural Dx: Same  Technically successful CT guided placed of a 10 Fr drainage catheter placement into the dominant collection within the right lower abdomen yielding 75 cc of thick dark tan colored fluid.   A representative aspirated sample was capped and sent to the laboratory for analysis.    EBL: Trace Complications: None immediate  Ronny Bacon, MD Pager #: [phone]

## 2021-04-18 NOTE — Progress Notes (Signed)
Transport here to take the patient to IR for the procedure. IR made aware of the patient's low potassium this morning that has not been replaced yet. Patient hemodynamically stable during transport.

## 2021-04-18 NOTE — Progress Notes (Signed)
Got a call from IR saying that they will be taking patient shortly for the procedure. Heparin kept on hold at 7:30 am, Pharmacy notified.

## 2021-04-18 NOTE — Progress Notes (Addendum)
ANTICOAGULATION CONSULT NOTE - Follow Up Consult  Pharmacy Consult for heparin Indication:  h/o PE /pancreatitis  Labs: Recent Labs    04/15/21 2008 04/16/21 7614 04/17/21 0341 04/17/21 0637 04/18/21 0251  HGB  --  8.6* 8.3*  --  7.6*  HCT  --  27.5* 25.6*  --  23.3*  PLT  --  547* 510*  --  396  APTT 69* 74* 65*  --   --   LABPROT  --  17.0* 17.8*  --   --   INR  --  1.4* 1.5*  --   --   HEPARINUNFRC  --  0.76* 0.28*  --  <0.10*  CREATININE  --  1.37*  --  1.14 1.05     Assessment: 55yo male subtherapeutic on heparin with initial dosing while Eliquis on hold.  HL <0.1 this AM. Heparin on hold for IR procedure today. Scr down 1.37>1.05.    Goal of Therapy:  Heparin 0.3-0.7 aPTT 66-102 seconds   Plan:  Hold heparin for IR procedure Restart heparin at 1700 units / hr when IR is ready for it to be restarted Daily heparin level Monitor CBC and s/sx of bleeding daily F/u with apixaban resume  ADDENDUM: Restart Heparin drip 6 hours after IR procedure - at 1700 units per hr HL 6 hours after restart  Alanda Slim, PharmD, [facility] Clinical Pharmacist Please see AMION for all Pharmacists' Contact Phone Numbers 04/18/2021, 7:37 AM

## 2021-04-18 NOTE — Consult Note (Signed)
° °  [facility] Inpatient Consult   04/18/2021  Scott Foley 15-Mar-1965 [id number]  [place] [employer] [ACO] Patient: [name]  Primary Care Provider:  [name], MD is an embedded provider with a Chronic Care Management team and program, and is listed for the transition of care follow up and appointments.  Patient was screened for high risk score for unplanned readmission risk.  Also, screened for Embedded practice service needs for chronic care management for post hospital needs. Encounter review reveals that patient has been active with the Embedded CCM pharmacist.  Met with the patient regarding following for progress and post hospital needs.  Patient endorses HIPAA and PCP.  A appointment reminder card and 24 hour nurse advise line magnet was given.  Patient appreciative of visit.  Plan:  Notification to be sent to the Pharmacist in the Embedded Chronic Care Management and made aware of TOC needs for post hospital needs such as to add CCM nursing. Will follow for progress.  Please contact for further questions,  [name], RN BSN [name]  [phone] business mobile phone Toll free office [phone]  Fax number: [phone] [email or web address]

## 2021-04-18 NOTE — Progress Notes (Signed)
ANTICOAGULATION CONSULT NOTE - Follow Up Consult  Pharmacy Consult for heparin Indication:  h/o PE /pancreatitis  Labs: Recent Labs    04/16/21 0649 04/17/21 0341 04/17/21 0637 04/18/21 0251 04/18/21 2237  HGB 8.6* 8.3*  --  7.6*  --   HCT 27.5* 25.6*  --  23.3*  --   PLT 547* 510*  --  396  --   APTT 74* 65*  --   --   --   LABPROT 17.0* 17.8*  --   --   --   INR 1.4* 1.5*  --   --   --   HEPARINUNFRC 0.76* 0.28*  --  <0.10* <0.10*  CREATININE 1.37*  --  1.14 1.05  --      Assessment: 55yo male subtherapeutic on heparin with initial dosing while Eliquis on hold.  Heparin level still < 0.10  Goal of Therapy:  Heparin 0.3-0.7 aPTT 66-102 seconds   Plan:  Increase heparin to 1900 units / hr Heparin level in 8 hours F/u with apixaban resume  Thank you Anette Guarneri, PharmD  04/18/2021, 11:05 PM

## 2021-04-18 NOTE — Progress Notes (Signed)
°  Transition of Care [facility]) Screening Note   Patient Details  Name: Scott Foley Date of Birth: 12-01-1965   Transition of Care [facility]) CM/SW Contact:    Bartholomew Crews, RN Phone Number: [phone] 04/18/2021, 3:12 PM    Transition of Care Department [facility]) has reviewed patient and no TOC needs have been identified at this time. We will continue to monitor patient advancement through interdisciplinary progression rounds. If new patient transition needs arise, please place a TOC consult.

## 2021-04-18 NOTE — Progress Notes (Addendum)
Progress Note Hospital Day: 6  Chief Complaint: pancreatitis         ASSESSMENT AND PLAN   Brief History: 56 yo male with pmh of CAD, HTN, PE on Eliquis, CVA, IDA, pancreatitis. He had a cholecystectomy in December 2022, + IOC positive. Underwent ERCP and developed post ERCP pancreatitis requiring admission. Discharged hom late December, readmitted early January with sepsis and respiratory failure due to [place]. Blood cultures positive for Klebsiella pneumoniae. Treated with antibiotics, discharged home on 02. At postop visit Per H+P note, CT scan showed "Progressive severe pancreatitis with extensive fluid collections surrounding the pancreas and extending into the right retroperitoneum. Many of these fluid collections now contain gas concerning for infection. The pancreas appears to enhance normally. Compression of the splenic vein and SMV. These vessels appear to remain patent at this time".  Patient subsequently readmitted 04/13/21   # Post-ERCP pancreatitis on 03/03/22, now with signs of infected necrosis / Sepsis.  We discussed case with our advanced biliary endoscopist who did not feel the fluid collection was amendable to endoscopic drainage so IR consulted.  --After plavix / Eliquis washout patient underwent CT guided percutaneous drain this am. About 69ml blood tinged cloudy drainage in collection bag.  --WBC 15 << 16.9. On Merrem --Await fluid culture  --No nausea/ vomiting / or abdominal pain today. Tube feeds have been resumed.   # [place] anemia, likely dilutional. Hgb 7.6  # COPD  # History of PE. Home Eliquis on hold. Getting IV heparin in hospital    SUBJECTIVE    Feels okay, got drain placed this. No abdominal pain.    OBJECTIVE      Scheduled inpatient medications:   feeding supplement (PROSource TF)  45 mL Per Tube BID   fentaNYL       fluticasone furoate-vilanterol  1 puff Inhalation Daily   And   umeclidinium bromide  1 puff Inhalation Daily   free  water  30 mL Per Tube Q4H   iohexol  100 mL Intravenous Once   metoprolol tartrate  25 mg Oral BID   midazolam       pantoprazole  40 mg Oral BID   potassium chloride  40 mEq Oral Q4H   sodium chloride flush  5 mL Intracatheter Q8H   vitamin B-12  1,000 mcg Per Tube Daily   Continuous inpatient infusions:   dextrose 5 % and 0.9% NaCl 50 mL/hr at 04/18/21 1136   feeding supplement (VITAL 1.5 CAL) 1,000 mL (04/18/21 1137)   heparin     meropenem (MERREM) IV 1 g (04/18/21 1136)   PRN inpatient medications: acetaminophen, albuterol, morphine injection, ondansetron **OR** ondansetron (ZOFRAN) IV, oxyCODONE  Vital signs in last 24 hours: Temp:  [97.7 F (36.5 C)-98.7 F (37.1 C)] 98 F (36.7 C) (02/06 1230) Pulse Rate:  [84-117] 100 (02/06 1230) Resp:  [16-22] 18 (02/06 1230) BP: (98-127)/(65-87) 111/65 (02/06 1230) SpO2:  [93 %-99 %] 95 % (02/06 1230) Last BM Date: 04/17/21  Intake/Output Summary (Last 24 hours) at 04/18/2021 1306 Last data filed at 04/18/2021 0958 Gross per 24 hour  Intake 1575.13 ml  Output 625 ml  Net 950.13 ml     Physical Exam:  General: Alert male in NAD. Enteral feedings in progress Heart:  Regular rate and rhythm. No lower extremity edema Pulmonary: Normal respiratory effort Abdomen: Soft, nondistended, a few bowel sounds, ~ 60 ml of blood tinged cloudy drainage in perc drain bag  Neurologic: Alert and oriented Psych:  Pleasant. Cooperative.   Filed Weights   04/15/21 1400  Weight: 84.8 kg    ntake/Output from previous day: 02/05 0701 - 02/06 0700 In: 1575.1 [I.V.:555.1; NG/GT:820; IV Piggyback:200] Out: 550 [Urine:550] Intake/Output this shift: Total I/O In: -  Out: 75 [Drains:75]   DIAGNOSTIC STUDIES THIS ADMISSION:  No results found.   Lab Results: Recent Labs    04/16/21 0649 04/17/21 0341 04/18/21 0251  WBC 17.2* 16.9* 15.0*  HGB 8.6* 8.3* 7.6*  HCT 27.5* 25.6* 23.3*  PLT 547* 510* 396   BMET Recent Labs     04/16/21 0649 04/17/21 0637 04/18/21 0251  NA 137 138 135  K 3.6 3.2* 2.8*  CL 103 105 102  CO2 24 25 25   GLUCOSE 103* 126* 88  BUN 14 14 14   CREATININE 1.37* 1.14 1.05  CALCIUM 8.3* 7.7* 7.3*   LFT Recent Labs    04/18/21 0251  PROT 4.8*  ALBUMIN <1.5*  AST 24  ALT 17  ALKPHOS 77  BILITOT 0.4   PT/INR Recent Labs    04/16/21 0649 04/17/21 0341  LABPROT 17.0* 17.8*  INR 1.4* 1.5*   Hepatitis Panel No results for input(s): HEPBSAG, HCVAB, HEPAIGM, HEPBIGM in the last 72 hours.    Principal Problem:   Severe acute pancreatitis with sepsis  Active Problems:   Coronary artery disease with stable angina pectoris (HCC)   Hyperlipemia   Chronic obstructive pulmonary disease (HCC)   Essential hypertension   History of pulmonary embolism   Sepsis ([place])   AKI (acute kidney injury) ([place])   Hypokalemia   Chronic respiratory failure with hypoxia (HCC)   Normocytic anemia   Hypoglycemia   Protein-calorie malnutrition, severe     LOS: 5 days   Tye Savoy ,NP 04/18/2021, 1:06 PM   I have taken a history, reviewed the chart and examined the patient. I performed a substantive portion of this encounter, including complete performance of at least one of the key components, in conjunction with the APP. I agree with the APP's note, impression and recommendations  56 year old male with complicated post-ERCP pancreatitis course, readmitted with infected pancreatic fluid collection, s/p IR drainage today.  Patient feeling  better, pain manageable, no nausea/vomiting.  Tolerating post-pyloric tube feeds, but having watery stools.  No new recommendations from GI standpoint, await culture/sensitivity results, continue tube feeds.  Malcomb Gangemi E. Candis Schatz, MD [facility] Gastroenterology

## 2021-04-18 NOTE — Progress Notes (Signed)
[facility] PROGRESS NOTE   Scott Foley ZLD:[id number] DOB: March 29, 1965 DOA: 04/13/2021  5 DOS: the patient was seen and examined on 04/18/2021  PCP: [name], MD  Brief History and Hospital Course:  56 y.o. male with medical history significant of hx of CAD, HTN, hx of PE on eliquis, HLD, hxo of CVA in 2014, IDA with history of recent hospitalizaion on 03/16/21 for recurrent pancreatitis, sepsis due to bacteremia from klebsiella pneumoniae, covid-19 and respiratory failure requiring home oxygen on discharge.  Presented with a 4-day history of diffuse worsening abdominal pain.  He has had abdominal issues for the past 2 months.  He had a cholecystectomy on 12/21 and underwent ERCP on 12/22 since the intraoperative cholangiogram showed a CBD stone.  After ERCP he developed pancreatitis.  He was managed conservatively and then discharged home on 12/28.  He was readmitted to [facility] on 1/4 for recurrent pancreatitis and COVID-19.  Noted to have sepsis respiratory failure.  Blood cultures were positive for Klebsiella pneumonia.  He was treated with Invanz for 10 days.  Discharged on 2 L of oxygen. He had a postop visit with surgeon on 04/12/21 when he complained of abdominal pain.  A CT scan was subsequently done which showed pancreatic necrosis with gas within the pancreatic fluid collections.  He was sent over to [facility] emergency department.  He was seen by general surgery.  Hospitalized for further management.  Placed on antibiotics.  Also noted to have acute kidney injury.  Denied any significant alcohol use. Patient is seen imaging and surgery as well as gastroenterology.  Interventional radiology consulted for aspiration of the fluid collection which is scheduled for today.  Consultants: General surgery.  Gastroenterology.  Interventional radiology  Procedures: None    Subjective: Patient seen after he returned from interventional radiology.  Noted to be somnolent.   Denies any significant pain.  No shortness of breath    Assessment/Plan:   * Severe acute pancreatitis with sepsis - (present on admission) This is either a third episode of pancreatitis or sequelae of pancreatitis from his episode in January.  His lipase level was noted to be normal.  He does have significant leukocytosis.  CT scan was done at [facility].  Do not have access to that report but according to the H&P it showed progressive severe pancreatitis with extensive fluid collection surrounding the pancreas and extending into the right retroperitoneum.  Many of these fluid collections containing gas concerning for infection.  Compression of splenic vein and SMV was noted although they appear to be patent.  Patient was started on meropenem.   Gastroenterology and general surgery have been consulted.  No role for endoscopic drainage at this time.  Interventional radiology consulted for percutaneous drainage which is scheduled for 2/6 to allow Plavix washout. Patient is stable for the most part.  Sepsis ([place])- (present on admission) On admission he met sepsis criteria with tachycardia, fever to 100.8, WBC to 18.2.  Recently noted to have Klebsiella bacteremia earlier in January when he was hospitalized in [place].  Blood cultures done here in the hospital have been negative so far. Remains afebrile.  WBC remains elevated though better.  Hypokalemia- (present on admission) Hypomagnesemia  Potassium noted to be low again today.  Will be supplemented.  Magnesium is 2.1.  AKI (acute kidney injury) ([place])- (present on admission) Presented with creatinine of 2.49. This was likely prerenal in the setting of sepsis along with nausea and vomiting over the past few days.  Patient aggressively hydrated.  Renal function has improved and back to normal now.  Monitor urine output.    Normocytic anemia- (present on admission) Significant drop in hemoglobin noted most of which appears to be  dilutional.   Anemia panel reviewed.  Vitamin B12 noted to be low normal at 254.  Being supplemented.  Folate 8.2.  Ferritin 1033.  TIBC 137.  Iron 16. Drop in hemoglobin noted again.  No overt bleeding.  Continue to monitor.  Transfuse if it drops less than 7.  May need to hold his heparin.  History of pulmonary embolism- (present on admission) Eliquis is currently on hold.  He is on heparin infusion.    Chronic respiratory failure with hypoxia ([place])- (present on admission) Recently recovered from COVID-19 infection.  Was discharged on 2 L of oxygen by nasal cannula.  Chest x-ray continues to show opacities which could be from his recent infection rather than any infection.   Respiratory status is stable.  Chronic obstructive pulmonary disease ([place])- (present on admission) No signs of exacerbation. Continue trelegy daily and albuterol prn.  Essential hypertension- (present on admission) Sinus tachycardia  ACE inhibitor on hold.  Borderline low blood pressures noted.   Sinus tachycardia likely secondary to severe illness.  Heart rate is better.  He was started back on beta-blocker due to concern for rebound tachycardia.  Heart rate is better.  Hyperlipemia- (present on admission) Statin on hold currently.  Triglyceride was 121 in December.  Coronary artery disease with stable angina pectoris ([place])- (present on admission) Stable.  Plavix currently on hold.   Protein-calorie malnutrition, severe Started on tube feedings.  Hypoglycemia Resolved with initiation of fluids and nutrition.   Obesity Estimated body mass index is 32.1 kg/m as calculated from the following:   Height as of this encounter: _0  (1.626 m).   Weight as of this encounter: 84.8 kg.   DVT Prophylaxis: On IV heparin Code Status: Full code Family Communication: Discussed with patient Disposition Plan: To be determined  Status is: Inpatient Remains inpatient appropriate because: Sepsis with pancreatic  necrosis with infection        Medications: Scheduled:  feeding supplement (PROSource TF)  45 mL Per Tube BID   fentaNYL       fluticasone furoate-vilanterol  1 puff Inhalation Daily   And   umeclidinium bromide  1 puff Inhalation Daily   free water  30 mL Per Tube Q4H   iohexol  100 mL Intravenous Once   metoprolol tartrate  25 mg Oral BID   midazolam       pantoprazole  40 mg Oral BID   potassium chloride  40 mEq Oral Q4H   sodium chloride flush  5 mL Intracatheter Q8H   vitamin B-12  1,000 mcg Per Tube Daily   Continuous:  dextrose 5 % and 0.9% NaCl 50 mL/hr at 04/17/21 1008   feeding supplement (VITAL 1.5 CAL) Stopped (04/18/21 0001)   heparin     meropenem (MERREM) IV 1 g (04/18/21 0021)   LXB:[id number], albuterol, morphine injection, ondansetron **OR** ondansetron (ZOFRAN) IV, oxyCODONE  Antibiotics: Anti-infectives (From admission, onward)    Start     Dose/Rate Route Frequency Ordered Stop   04/16/21 1600  meropenem (MERREM) 1 g in sodium chloride 0.9 % 100 mL IVPB        1 g 200 mL/hr over 30 Minutes Intravenous Every 8 hours 04/16/21 1234     04/13/21 2000  meropenem (MERREM) 1 g in sodium chloride 0.9 %  100 mL IVPB  Status:  Discontinued        1 g 200 mL/hr over 30 Minutes Intravenous Every 12 hours 04/13/21 1934 04/16/21 1234   04/13/21 1800  ceFEPIme (MAXIPIME) 2 g in sodium chloride 0.9 % 100 mL IVPB  Status:  Discontinued        2 g 200 mL/hr over 30 Minutes Intravenous Every 12 hours 04/13/21 1731 04/13/21 1852   04/13/21 1715  levofloxacin (LEVAQUIN) IVPB 750 mg  Status:  Discontinued        750 mg 100 mL/hr over 90 Minutes Intravenous  Once 04/13/21 1713 04/13/21 1728   04/13/21 1715  metroNIDAZOLE (FLAGYL) IVPB 500 mg  Status:  Discontinued        500 mg 100 mL/hr over 60 Minutes Intravenous  Once 04/13/21 1713 04/13/21 1852       Objective:  Vital Signs  Vitals:   04/18/21 0950 04/18/21 0955 04/18/21 1010 04/18/21 1055  BP: 120/77  122/81 119/79 104/84  Pulse: (!) 101 (!) 104 (!) 106 99  Resp: 19 (!) 22 (!) 22 20  Temp:      TempSrc:      SpO2: 95% 96% 94% 97%  Weight:      Height:        Intake/Output Summary (Last 24 hours) at 04/18/2021 1118 Last data filed at 04/18/2021 0958 Gross per 24 hour  Intake 1575.13 ml  Output 625 ml  Net 950.13 ml    Filed Weights   04/15/21 1400  Weight: 84.8 kg   General appearance: Somnolent but easily arousable.  In no distress Resp: Diminished air entry at the bases.  Normal effort. Cardio: S1-S2 is tachycardic, regular  GI: Abdomen is soft.  Tender in the epigastric area.  Drain tube is noted in the right lateral abdomen.   Extremities: No edema.  Moving all of his extremities Neurologic: No focal neurological deficits.      Lab Results:  Data Reviewed: I have personally reviewed labs and imaging study reports  CBC: Recent Labs  Lab 04/13/21 1523 04/14/21 0313 04/15/21 0153 04/15/21 1653 04/16/21 0649 04/17/21 0341 04/18/21 0251  WBC 18.2*   < > 15.0* 16.1* 17.2* 16.9* 15.0*  NEUTROABS 14.5*  --   --   --   --   --   --   HGB 11.1*   < > 8.4* 8.9* 8.6* 8.3* 7.6*  HCT 34.2*   < > 25.8* 27.5* 27.5* 25.6* 23.3*  MCV 85.9   < > 88.1 85.9 89.6 88.0 86.9  PLT 651*   < > 495* 507* 547* 510* 396   < > = values in this interval not displayed.     Basic Metabolic Panel: Recent Labs  Lab 04/13/21 1854 04/14/21 0230 04/15/21 0153 04/16/21 0649 04/17/21 0637 04/18/21 0251  NA 130* 135 137 137 138 135  K 3.4* 2.7* 3.0* 3.6 3.2* 2.8*  CL 96* 99 99 103 105 102  CO2 20* _0 GLUCOSE 89 91 64* 103* 126* 88  BUN 22* 21* _1 CREATININE 2.19* 2.10* 1.79* 1.37* 1.14 1.05  CALCIUM 7.9* 8.6* 8.4* 8.3* 7.7* 7.3*  MG 1.6*  --  1.4*  --  1.5* 2.1     GFR: Estimated Creatinine Clearance: 77.1 mL/min (by C-G formula based on SCr of 1.05 mg/dL).  Liver Function Tests: Recent Labs  Lab 04/14/21 0230 04/15/21 0153 04/16/21 0649  04/17/21 0637 04/18/21 0251  AST 20 17  _0 ALT _1 ALKPHOS 113 98 92 67 77  BILITOT 0.3 0.6 0.3 0.2* 0.4  PROT 6.5 5.6* 5.8* 5.3* 4.8*  ALBUMIN 2.0* 1.7* 1.7* 1.5* <1.5*     Recent Labs  Lab 04/13/21 1523  LIPASE 36     Coagulation Profile: Recent Labs  Lab 04/13/21 1523 04/15/21 0153 04/16/21 0649 04/17/21 0341  INR 2.4* 1.6* 1.4* 1.5*      Recent Results (from the past 240 hour(s))  Resp Panel by RT-PCR (Flu A&B, Covid) Nasopharyngeal Swab     Status: None   Collection Time: 04/13/21  3:22 PM   Specimen: Nasopharyngeal Swab; Nasopharyngeal(NP) swabs in vial transport medium  Result Value Ref Range Status   SARS Coronavirus 2 by RT PCR NEGATIVE NEGATIVE Final    Comment: (NOTE) SARS-CoV-2 target nucleic acids are NOT DETECTED.  The SARS-CoV-2 RNA is generally detectable in upper respiratory specimens during the acute phase of infection. The lowest concentration of SARS-CoV-2 viral copies this assay can detect is 138 copies/mL. A negative result does not preclude SARS-Cov-2 infection and should not be used as the sole basis for treatment or other patient management decisions. A negative result may occur with  improper specimen collection/handling, submission of specimen other than nasopharyngeal swab, presence of viral mutation(s) within the areas targeted by this assay, and inadequate number of viral copies(<138 copies/mL). A negative result must be combined with clinical observations, patient history, and epidemiological information. The expected result is Negative.  Fact Sheet for Patients:  EntrepreneurPulse.com.au  Fact Sheet for Healthcare Providers:  IncredibleEmployment.be  This test is no t yet approved or cleared by the [place] FDA and  has been authorized for detection and/or diagnosis of SARS-CoV-2 by FDA under an Emergency Use Authorization (EUA). This EUA will remain  in effect  (meaning this test can be used) for the duration of the COVID-19 declaration under Section 564(b)(1) of the Act, 21 U.S.C.section 360bbb-3(b)(1), unless the authorization is terminated  or revoked sooner.       Influenza A by PCR NEGATIVE NEGATIVE Final   Influenza B by PCR NEGATIVE NEGATIVE Final    Comment: (NOTE) The Xpert Xpress SARS-CoV-2/FLU/RSV plus assay is intended as an aid in the diagnosis of influenza from Nasopharyngeal swab specimens and should not be used as a sole basis for treatment. Nasal washings and aspirates are unacceptable for Xpert Xpress SARS-CoV-2/FLU/RSV testing.  Fact Sheet for Patients: EntrepreneurPulse.com.au  Fact Sheet for Healthcare Providers: IncredibleEmployment.be  This test is not yet approved or cleared by the [place] FDA and has been authorized for detection and/or diagnosis of SARS-CoV-2 by FDA under an Emergency Use Authorization (EUA). This EUA will remain in effect (meaning this test can be used) for the duration of the COVID-19 declaration under Section 564(b)(1) of the Act, 21 U.S.C. section 360bbb-3(b)(1), unless the authorization is terminated or revoked.  Performed at [facility], [place] [address], [place], [place] [address]   Blood Culture (routine x 2)     Status: None   Collection Time: 04/13/21  3:24 PM   Specimen: BLOOD  Result Value Ref Range Status   Specimen Description BLOOD SITE NOT SPECIFIED  Final   Special Requests   Final    BOTTLES DRAWN AEROBIC AND ANAEROBIC Blood Culture adequate volume   Culture   Final    NO GROWTH 5 DAYS Performed at [facility], [address], [place],  [address]  Report Status 04/18/2021 FINAL  Final  Blood Culture (routine x 2)     Status: None   Collection Time: 04/13/21  5:55 PM   Specimen: BLOOD  Result Value Ref Range Status   Specimen Description BLOOD LEFT ANTECUBITAL  Final   Special Requests   Final     BOTTLES DRAWN AEROBIC AND ANAEROBIC Blood Culture results may not be optimal due to an excessive volume of blood received in culture bottles   Culture   Final    NO GROWTH 5 DAYS Performed at [facility], [place] [address], [place], [place] [address]    Report Status 04/18/2021 FINAL  Final  Urine Culture     Status: None   Collection Time: 04/13/21  6:33 PM   Specimen: Urine, Clean Catch  Result Value Ref Range Status   Specimen Description URINE, CLEAN CATCH  Final   Special Requests NONE  Final   Culture   Final    NO GROWTH Performed at [facility], [place] [address] [place], [place] [address]    Report Status 04/15/2021 FINAL  Final       Radiology Studies: No results found.     LOS: 5 days   Oluwaseyi T[employer] on [email or web address]  04/18/2021, 11:18 AM

## 2021-04-18 NOTE — Progress Notes (Signed)
Patient ID: Scott Foley, male   DOB: 19-May-1965, 56 y.o.   MRN: [id number] [facility] Surgery Progress Note     Subjective: CC-  No new complaints. Just got back from IR. Stable mild upper abdominal discomfort. No nausea or vomiting. Having bowel function.   Objective: Vital signs in last 24 hours: Temp:  [97.7 F (36.5 C)-98.7 F (37.1 C)] 98.7 F (37.1 C) (02/06 0717) Pulse Rate:  [84-117] 99 (02/06 1130) Resp:  [16-22] 22 (02/06 1130) BP: (98-127)/(69-87) 112/74 (02/06 1130) SpO2:  [94 %-99 %] 96 % (02/06 1130) Last BM Date: 04/17/21  Intake/Output from previous day: 02/05 0701 - 02/06 0700 In: 1575.1 [I.V.:555.1; NG/GT:820; IV Piggyback:200] Out: 550 [Urine:550] Intake/Output this shift: Total I/O In: -  Out: 75 [Drains:75]  PE: Gen:  Alert, NAD, pleasant Card: rrr Pulm:  rate and effort normal Abd: Soft, ND, mild TTP, worse in upper abdomen, no peritonitis; new IR perc drain RLQ with bloody purulent fluid   Lab Results:  Recent Labs    04/17/21 0341 04/18/21 0251  WBC 16.9* 15.0*  HGB 8.3* 7.6*  HCT 25.6* 23.3*  PLT 510* 396   BMET Recent Labs    04/17/21 0637 04/18/21 0251  NA 138 135  K 3.2* 2.8*  CL 105 102  CO2 25 25  GLUCOSE 126* 88  BUN 14 14  CREATININE 1.14 1.05  CALCIUM 7.7* 7.3*   PT/INR Recent Labs    04/16/21 0649 04/17/21 0341  LABPROT 17.0* 17.8*  INR 1.4* 1.5*   CMP     Component Value Date/Time   NA 135 04/18/2021 0251   NA 140 04/01/2021 1047   K 2.8 (L) 04/18/2021 0251   CL 102 04/18/2021 0251   CO2 25 04/18/2021 0251   GLUCOSE 88 04/18/2021 0251   BUN 14 04/18/2021 0251   BUN 24 04/01/2021 1047   CREATININE 1.05 04/18/2021 0251   CALCIUM 7.3 (L) 04/18/2021 0251   PROT 4.8 (L) 04/18/2021 0251   PROT 6.5 04/01/2021 1047   ALBUMIN <1.5 (L) 04/18/2021 0251   ALBUMIN 3.1 (L) 04/01/2021 1047   AST 24 04/18/2021 0251   ALT 17 04/18/2021 0251   ALKPHOS 77 04/18/2021 0251   BILITOT 0.4 04/18/2021 0251    BILITOT 0.6 04/01/2021 1047   GFRNONAA >60 04/18/2021 0251   GFRAA 105 12/15/2019 1034   Lipase     Component Value Date/Time   LIPASE 36 04/13/2021 1523       Studies/Results: No results found.  Anti-infectives: Anti-infectives (From admission, onward)    Start     Dose/Rate Route Frequency Ordered Stop   04/16/21 1600  meropenem (MERREM) 1 g in sodium chloride 0.9 % 100 mL IVPB        1 g 200 mL/hr over 30 Minutes Intravenous Every 8 hours 04/16/21 1234     04/13/21 2000  meropenem (MERREM) 1 g in sodium chloride 0.9 % 100 mL IVPB  Status:  Discontinued        1 g 200 mL/hr over 30 Minutes Intravenous Every 12 hours 04/13/21 1934 04/16/21 1234   04/13/21 1800  ceFEPIme (MAXIPIME) 2 g in sodium chloride 0.9 % 100 mL IVPB  Status:  Discontinued        2 g 200 mL/hr over 30 Minutes Intravenous Every 12 hours 04/13/21 1731 04/13/21 1852   04/13/21 1715  levofloxacin (LEVAQUIN) IVPB 750 mg  Status:  Discontinued        750 mg 100 mL/hr over  90 Minutes Intravenous  Once 04/13/21 1713 04/13/21 1728   04/13/21 1715  metroNIDAZOLE (FLAGYL) IVPB 500 mg  Status:  Discontinued        500 mg 100 mL/hr over 60 Minutes Intravenous  Once 04/13/21 1713 04/13/21 1852        Assessment/Plan Post-ERCP pancreatitis, now with signs of infected necrosis  - s/p lap chole 03/02/21 by Dr. Lilia Pro with abnormal IOC >> underwent ERCP 03/03/21 - s/p IR perc drain RLQ collection, 75cc tan fluid, sent for culture  - ok to resume TF - resumption of heparin per IR -  Continue IV antibiotics.     ID - merrem 2/1>> FEN - PP TF,  ok for some PO intake from [facility] perspective.  VTE - hold plavix/ eliquis, ok to resume heparin gtt from ccs perspective  Foley - none   CAD HTN HLD COPD Hx PE ~3 years ago on eliquis (last dose 2/1) Hx CVA in 2014 on plavix (last dose 2/1) IDA Tobacco abuse - quit smoking 5 months ago  Moderate Medical Decision Making   LOS: 5 days   Nadeen Landau, MD  [facility], [place]

## 2021-04-18 NOTE — Progress Notes (Signed)
Patient back to room from IR. Patient is A&O*4. Able to verbalize his needs to staff. No CO pain or discomfort. Drain to right lower quadrant dry and intact. Cardiac monitor applied. Bed kept in low position and locked. VS checked. Call bell in reach.

## 2021-04-19 DIAGNOSIS — I10 Essential (primary) hypertension: Secondary | ICD-10-CM | POA: Diagnosis not present

## 2021-04-19 DIAGNOSIS — K859 Acute pancreatitis without necrosis or infection, unspecified: Secondary | ICD-10-CM | POA: Diagnosis not present

## 2021-04-19 DIAGNOSIS — E876 Hypokalemia: Secondary | ICD-10-CM | POA: Diagnosis not present

## 2021-04-19 DIAGNOSIS — Z86711 Personal history of pulmonary embolism: Secondary | ICD-10-CM | POA: Diagnosis not present

## 2021-04-19 LAB — BASIC METABOLIC PANEL
Anion gap: 9 (ref 5–15)
BUN: 14 mg/dL (ref 6–20)
CO2: 23 mmol/L (ref 22–32)
Calcium: 7.6 mg/dL — ABNORMAL LOW (ref 8.9–10.3)
Chloride: 107 mmol/L (ref 98–111)
Creatinine, Ser: 0.99 mg/dL (ref 0.61–1.24)
GFR, Estimated: >60 mL/min (ref >60)
Glucose, Bld: 120 mg/dL — ABNORMAL HIGH (ref 70–99)
Potassium: 3.8 mmol/L (ref 3.5–5.1)
Sodium: 139 mmol/L (ref 135–145)

## 2021-04-19 LAB — GLUCOSE, CAPILLARY
Glucose-Capillary: 101 mg/dL — ABNORMAL HIGH (ref 70–99)
Glucose-Capillary: 102 mg/dL — ABNORMAL HIGH (ref 70–99)
Glucose-Capillary: 110 mg/dL — ABNORMAL HIGH (ref 70–99)
Glucose-Capillary: 111 mg/dL — ABNORMAL HIGH (ref 70–99)
Glucose-Capillary: 120 mg/dL — ABNORMAL HIGH (ref 70–99)
Glucose-Capillary: 88 mg/dL (ref 70–99)

## 2021-04-19 LAB — CBC
HCT: 24.2 % — ABNORMAL LOW (ref 39.0–52.0)
Hemoglobin: 8 g/dL — ABNORMAL LOW (ref 13.0–17.0)
MCH: 28.6 pg (ref 26.0–34.0)
MCHC: 33.1 g/dL (ref 30.0–36.0)
MCV: 86.4 fL (ref 80.0–100.0)
Platelets: 363 10*3/uL (ref 150–400)
RBC: 2.8 MIL/uL — ABNORMAL LOW (ref 4.22–5.81)
RDW: 16 % — ABNORMAL HIGH (ref 11.5–15.5)
WBC: 14.7 10*3/uL — ABNORMAL HIGH (ref 4.0–10.5)
nRBC: 0 % (ref 0.0–0.2)

## 2021-04-19 LAB — TYPE AND SCREEN
ABO/RH(D): A POS
Antibody Screen: NEGATIVE

## 2021-04-19 LAB — ABO/RH: ABO/RH(D): A POS

## 2021-04-19 LAB — HEPARIN LEVEL (UNFRACTIONATED)
Heparin Unfractionated: <0.1 IU/mL — ABNORMAL LOW (ref 0.30–0.70)
Heparin Unfractionated: 0.18 IU/mL — ABNORMAL LOW (ref 0.30–0.70)

## 2021-04-19 LAB — MAGNESIUM: Magnesium: 1.6 mg/dL — ABNORMAL LOW (ref 1.7–2.4)

## 2021-04-19 MED ORDER — MAGNESIUM SULFATE 4 GM/100ML IV SOLN
4.0000 g | Freq: Once | INTRAVENOUS | Status: AC
  Administered 2021-04-19: 4 g via INTRAVENOUS
  Filled 2021-04-19: qty 100

## 2021-04-19 NOTE — Progress Notes (Signed)
ANTICOAGULATION CONSULT NOTE - Follow Up Consult  Pharmacy Consult for heparin Indication:  h/o PE /pancreatitis  Labs: Recent Labs    04/17/21 0341 04/17/21 0637 04/18/21 0251 04/18/21 2237 04/19/21 0533 04/19/21 0811  HGB 8.3*  --  7.6*  --  8.0*  --   HCT 25.6*  --  23.3*  --  24.2*  --   PLT 510*  --  396  --  363  --   APTT 65*  --   --   --   --   --   LABPROT 17.8*  --   --   --   --   --   INR 1.5*  --   --   --   --   --   HEPARINUNFRC 0.28*  --  <0.10* <0.10*  --  <0.10*  CREATININE  --  1.14 1.05  --  0.99  --      Assessment: 55yo male subtherapeutic on heparin with initial dosing while Eliquis on hold.  HL <0.1 this AM on 1900 units/hr.  Scr down 1.37>1.05.    Goal of Therapy:  Heparin 0.3-0.7 aPTT 66-102 seconds   Plan:  Increase heparin to 2150 units/hr Heparin level in 6 hours Daily heparin level Monitor CBC and s/sx of bleeding daily F/u with apixaban resume   Alanda Slim, PharmD, [facility] Clinical Pharmacist Please see AMION for all Pharmacists' Contact Phone Numbers 04/19/2021, 9:35 AM

## 2021-04-19 NOTE — Progress Notes (Signed)
ANTICOAGULATION CONSULT NOTE - Follow Up Consult  Pharmacy Consult for heparin Indication:  h/o PE  Labs: Recent Labs    04/17/21 0341 04/17/21 0637 04/18/21 0251 04/18/21 2237 04/19/21 0533 04/19/21 0811 04/19/21 1543  HGB 8.3*  --  7.6*  --  8.0*  --   --   HCT 25.6*  --  23.3*  --  24.2*  --   --   PLT 510*  --  396  --  363  --   --   APTT 65*  --   --   --   --   --   --   LABPROT 17.8*  --   --   --   --   --   --   INR 1.5*  --   --   --   --   --   --   HEPARINUNFRC 0.28*  --  <0.10* <0.10*  --  <0.10* 0.18*  CREATININE  --  1.14 1.05  --  0.99  --   --      Assessment: 56yo male subtherapeutic on heparin with initial dosing while Eliquis on hold.  Heparin level subtherapeutic (0.18) on heparin infusion at 2150 units/hr. No issues with line or bleeding reported per RN.  Goal of Therapy:  Heparin 0.3-0.7 units/ml   Plan:  Increase heparin to 2300 units/hr Heparin level in 6 hours Daily heparin level Monitor CBC and s/sx of bleeding daily F/u with apixaban resume  Sherlon Handing, PharmD, BCPS Please see amion for complete clinical pharmacist phone list 04/19/2021, 5:59 PM

## 2021-04-19 NOTE — Progress Notes (Signed)
Referring Physician(s): Bonnielee Haff  Supervising Physician: Michaelle Birks  Patient Status:  [facility] - In-pt  Chief Complaint: Follow up RLQ drain placement 04/18/21 in IR  Subjective:  Patient seen at bedside, ordering lunch. He states abdomen is sore at drain site but feels better overall. Denies any n/v.  Allergies: Penicillin g  Medications: Prior to Admission medications   Medication Sig Start Date End Date Taking? Authorizing Provider  albuterol (VENTOLIN HFA) 108 (90 Base) MCG/ACT inhaler Inhale 2 puffs into the lungs every 6 (six) hours as needed for wheezing or shortness of breath. 04/04/21  Yes Rip Harbour, NP  apixaban (ELIQUIS) 5 MG TABS tablet Take 1 tablet (5 mg total) by mouth 2 (two) times daily. 04/01/21  Yes Rip Harbour, NP  clopidogrel (PLAVIX) 75 MG tablet Take 1 tablet (75 mg total) by mouth daily. 04/01/21  Yes Rip Harbour, NP  Evolocumab (REPATHA) 140 MG/ML SOSY Inject 140 mg into the skin every 14 (fourteen) days. 12/30/20  Yes [name], [name], MD  Fluticasone-Umeclidin-Vilant (TRELEGY ELLIPTA) 100-62.5-25 MCG/ACT AEPB Inhale 1 puff into the lungs daily. 04/04/21  Yes Rip Harbour, NP  Ibuprofen-diphenhydrAMINE Cit (ADVIL PM PO) Take 1 tablet by mouth at bedtime.   Yes [provider]  isosorbide mononitrate (IMDUR) 60 MG 24 hr tablet Take 1 tablet (60 mg total) by mouth daily. 04/01/21  Yes Rip Harbour, NP  lisinopril (ZESTRIL) 40 MG tablet Take 1 tablet (40 mg total) by mouth daily. 04/01/21  Yes Rip Harbour, NP  metoprolol tartrate (LOPRESSOR) 50 MG tablet Take 1 tablet (50 mg total) by mouth 2 (two) times daily. 04/01/21  Yes Rip Harbour, NP  Omega-3 Fatty Acids (FISH OIL) 1000 MG CAPS Take 1 capsule (1,000 mg total) by mouth 2 (two) times daily. 11/30/20  Yes [name], [name], MD  pantoprazole (PROTONIX) 40 MG tablet Take 1 tablet (40 mg total) by mouth daily. Patient taking differently: Take 40 mg by mouth 2 (two) times  daily. 02/15/21  Yes Rip Harbour, NP  rosuvastatin (CRESTOR) 40 MG tablet Take 1 tablet (40 mg total) by mouth daily. Patient taking differently: Take 40 mg by mouth at bedtime. 04/01/21  Yes Rip Harbour, NP     Vital Signs: BP 95/71    Pulse (!) 105    Temp 98.3 F (36.8 C)    Resp 20    Ht 5\' 4"  (1.626 m)    Wt 187 lb (84.8 kg)    SpO2 93%    BMI 32.10 kg/m   Physical Exam Vitals and nursing note reviewed.  Constitutional:      General: He is not in acute distress. HENT:     Head: Normocephalic.     Nose:     Comments: NG in place Cardiovascular:     Rate and Rhythm: Tachycardia present.  Pulmonary:     Effort: Pulmonary effort is normal.  Abdominal:     General: There is no distension.     Palpations: Abdomen is soft.     Tenderness: There is abdominal tenderness (RLQ at drain insertion site).     Comments: (+) RLQ drain to gravity with tan output in bag. Flushes/aspirates easily without pain. Retention suture and stat lock in place. No erythema, edema, drainage or bleeding from insertion site. Appropriately TTP.  Skin:    General: Skin is warm and dry.  Neurological:     Mental Status: He is alert.    Imaging: DG  Abd 1 View  Result Date: 04/15/2021 CLINICAL DATA:  Confirmation of post pyloric feeding tube placement EXAM: ABDOMEN - 1 VIEW COMPARISON:  None. FINDINGS: The bowel gas pattern is normal. Feeding tube coursing along the expected route of the duodenum with distal tip in the distal duodenum/proximal jejunum. Small amount of intraluminal Gastrografin in the distal duodenum/proximal jejunum. IMPRESSION: Satisfactory positioning of the feeding tube in the distal duodenum/proximal jejunum. Electronically Signed   By: Keane Police D.O.   On: 04/15/2021 16:58   CT ABDOMEN PELVIS W CONTRAST  Result Date: 04/18/2021 CLINICAL DATA:  History of necrotic pancreatitis in a 56 year old male, concern for abscess. EXAM: CT ABDOMEN AND PELVIS WITH CONTRAST TECHNIQUE:  Multidetector CT imaging of the abdomen and pelvis was performed using the standard protocol following bolus administration of intravenous contrast. RADIATION DOSE REDUCTION: This exam was performed according to the departmental dose-optimization program which includes automated exposure control, adjustment of the mA and/or kV according to patient size and/or use of iterative reconstruction technique. CONTRAST:  72mL OMNIPAQUE IOHEXOL 300 MG/ML  SOLN COMPARISON:  April 12, 2021. FINDINGS: Lower chest: Basilar volume loss and bilateral pleural effusions have developed since the prior study. More consolidative appearance in the LEFT lower lobe. Effusions are present, small to moderate on the LEFT and small on the RIGHT. w subtle patchy airspace opacities are noted with similar appearance within aerated portions of the lung compared to the most recent comparison study. Hepatobiliary: No focal, suspicious hepatic lesion. Suspect background hepatic steatosis. Post cholecystectomy. No substantial biliary duct dilation. High-grade narrowing of the portal vein just below the level of the portal-splenic confluence is similar to the prior exam. SMV beyond this level does appear patent but there is substantial surrounding inflammatory change in the setting of sequela of pancreatitis with walled-off necrosis involving the gland and the peripancreatic fat. Some adjacent stranding could reflect superimposed acute interstitial edematous changes as well. There is a band of hypoenhancement that travels through the pancreatic head indicative of glandular necrosis (image 37/3) Pancreas: Findings outlined partially above with respect to glandular and peripancreatic necrosis and with walled off necrosis showing peripheral enhancement and gas present throughout the pancreatic bed adjacent to the body, neck and head of the pancreas and tracking into the RIGHT retroperitoneum and root of the small bowel mesentery. Collection tracking  into the transverse mesocolon (image 35/3) 5.6 x 5.8 cm, within 1-2 mm when measured in a similar fashion. Additional communicating area in the anterior lesser sac (image 30/3) 5.0 x 3.0 cm previously 6.1 x 3.1 cm. Fluid tracking anterior to the pancreas and inferior to the pancreas about the body-tail junction is also similar and also containing gas measuring up to 2.6 cm greatest thickness. Walled-off necrosis of fat that extends throughout the anterior pararenal space and tracks into the RIGHT lower quadrant of the pelvis where there is a discrete collection with peripheral enhancement measuring 5.1 x 4.7 cm previously 6.6 x 5.3 cm. This is inseparable from the RIGHT iliacus muscle. Spleen: Splenic vein is highly narrowed. Spleen enhances normally aside from very subtle low attenuation at the periphery which is unchanged potentially a tiny splenic infarct (image 25/3) up to 9 mm. Adrenals/Urinary Tract: Adrenal glands are unremarkable. Symmetric renal enhancement. No sign of hydronephrosis. No suspicious renal lesion or perinephric stranding. Urinary bladder is grossly unremarkable. Stomach/Bowel: Enteric tube in place terminating just beyond the ligament of Treitz. No signs of small bowel obstruction. Inflammation extends along the LEFT flank as well about the  anterior renal fascia and lateral conal fascia with a nodular appearance that is not changed. These inflammatory changes do not cause colonic narrowing. The appendix is normal. Vascular/Lymphatic: Chronic appearing focal dilation of the infrarenal abdominal aorta measuring 3.3 cm with associated mural thrombus is unchanged measuring approximately 3.3 cm greatest axial dimension. Stable since December of 2020. There is no gastrohepatic or hepatoduodenal ligament lymphadenopathy. No retroperitoneal or mesenteric lymphadenopathy. No pelvic sidewall lymphadenopathy. Reproductive: Unremarkable by CT. Other: No ascites. Signs of body wall edema which have  developed since the previous study. This is along the RIGHT and LEFT flank. Musculoskeletal: Chronic T12 loss of height approximately 40% and associated degenerative changes throughout the spine without change. IMPRESSION: 1. Signs of glandular and peripancreatic necrosis with walled-off necrosis involving the gland and the peripancreatic fat. Inflammatory changes track into the retroperitoneum and lesser sac as well as transverse mesocolon and show mild improvement in some areas as discussed above, still with luminal gas and or peripheral enhancement not allowing for exclusion of concomitant infection. 2. 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 3. Extensive nodular inflammatory changes in the LEFT retroperitoneum without focal collection 4. High-grade narrowing of the SMV/portal vein greatest below the portal splenic confluence is similar to the prior exam, attention on follow-up. Main portal vein remains patent into the liver. Peripheral branches of the SMV also appear patent. 5. Concomitant high-grade narrowing of the splenic vein 6. Focal rightward infrarenal abdominal aortic dilation with associated mural thrombus/extensive soft plaque extends 12 mm beyond the expected lumen and shows little change with a maximal caliber of 3.3 cm since 2020. Based on the focal appearance penetrating atherosclerotic ulcer or chronic pseudoaneurysm are considered. CT angiography at 1 year interval may be helpful given the focal nature of this finding. Vascular consultation could also be beneficial on follow-up. Aortic Atherosclerosis (ICD10-I70.0). Electronically Signed   By: Zetta Bills M.D.   On: 04/18/2021 09:36   CT IMAGE GUIDED DRAINAGE BY PERCUTANEOUS CATHETER  Result Date: 04/18/2021 INDICATION: History of pancreatitis and pseudocyst formation following ERCP with persistent elevated white blood cell count. Patient has been evaluated  by the hepatobiliary service (Dr. Zenia Resides), and request has been made for placement of a percutaneous drainage catheter for infection source control purposes. EXAM: CT IMAGE GUIDED DRAINAGE BY PERCUTANEOUS CATHETER COMPARISON:  CT abdomen pelvis-04/18/2021; 04/13/2021 MEDICATIONS: The patient is currently admitted to the hospital and receiving intravenous antibiotics. The antibiotics were administered within an appropriate time frame prior to the initiation of the procedure. ANESTHESIA/SEDATION: Moderate (conscious) sedation was employed during this procedure as administered by the Interventional Radiology RN. A total of Versed 2 mg and Fentanyl 100 mcg was administered intravenously. Moderate Sedation Time: 18 minutes. The patient's level of consciousness and vital signs were monitored continuously by radiology nursing throughout the procedure under my direct supervision. CONTRAST:  None COMPLICATIONS: None immediate. PROCEDURE: RADIATION DOSE REDUCTION: This exam was performed according to the departmental dose-optimization program which includes automated exposure control, adjustment of the mA and/or kV according to patient size and/or use of iterative reconstruction technique. Informed written consent was obtained from the patient after a discussion of the risks, benefits and alternatives to treatment. The patient was placed supine on the CT gantry and a pre procedural CT was performed re-demonstrating the known abscess/fluid collection within the pancreatic bed, extending along right iliopsoas musculature to the right lower abdomen with dominant component within the right lower abdomen measuring  approximately 5.6 x 5.0 cm (image 49, series 2). The procedure was planned. A timeout was performed prior to the initiation of the procedure. The skin overlying the inferolateral aspect of the right mid abdomen was prepped and draped in the usual sterile fashion. The overlying soft tissues were anesthetized with 1%  lidocaine with epinephrine. Appropriate trajectory was planned with the use of a 22 gauge spinal needle. An 18 gauge trocar needle was advanced into the abscess/fluid collection and a short Amplatz super stiff wire was coiled within the collection. Appropriate positioning was confirmed with a limited CT scan. The tract was serially dilated allowing placement of a 12 [place] all-purpose drainage catheter. Appropriate positioning was confirmed with a limited postprocedural CT scan. Approximately 75 ml of thick, dark tan colored fluid was aspirated. The tube was connected to a drainage bag and sutured in place. A dressing was placed. The patient tolerated the procedure well without immediate post procedural complication. IMPRESSION: Successful CT guided placement of a 66 French all purpose drain catheter into the right lower abdominal quadrant pseudocyst with aspiration of 75 mL of thick, dark tan colored fluid. Samples were sent to the laboratory as requested by the ordering clinical team. Electronically Signed   By: Sandi Mariscal M.D.   On: 04/18/2021 10:51    Labs:  CBC: Recent Labs    04/16/21 0649 04/17/21 0341 04/18/21 0251 04/19/21 0533  WBC 17.2* 16.9* 15.0* 14.7*  HGB 8.6* 8.3* 7.6* 8.0*  HCT 27.5* 25.6* 23.3* 24.2*  PLT 547* 510* 396 363    COAGS: Recent Labs    04/13/21 1523 04/14/21 0230 04/15/21 0153 04/15/21 0909 04/15/21 2008 04/16/21 0649 04/17/21 0341  INR 2.4*  --  1.6*  --   --  1.4* 1.5*  APTT 52*   < >  --  62* 69* 74* 65*   < > = values in this interval not displayed.    BMP: Recent Labs    04/16/21 0649 04/17/21 0637 04/18/21 0251 04/19/21 0533  NA 137 138 135 139  K 3.6 3.2* 2.8* 3.8  CL 103 105 102 107  CO2 24 25 25 23   GLUCOSE 103* 126* 88 120*  BUN 14 14 14 14   CALCIUM 8.3* 7.7* 7.3* 7.6*  CREATININE 1.37* 1.14 1.05 0.99  GFRNONAA >60 >60 >60 >60    LIVER FUNCTION TESTS: Recent Labs    04/15/21 0153 04/16/21 0649 04/17/21 0637 04/18/21 0251   BILITOT 0.6 0.3 0.2* 0.4  AST 17 18 16 24   ALT 18 17 15 17   ALKPHOS 98 92 67 77  PROT 5.6* 5.8* 5.3* 4.8*  ALBUMIN 1.7* 1.7* 1.5* <1.5*    Assessment and Plan:  56 y/o M admitted with pancreatitis found to have RLQ fluid collection concerning for abscess who underwent percutaneous drain placement in IR 04/18/21 seen today for drain follow up.   175 cc output since placement, output is dark tan without obvious blood. Culture pending, WBC slightly improved to 14.7 today, hgb stable at 8.0. Tmax 98.3, tachycardic, hypotensive (similar to vitals on presentation). Feeling better overall, hungry.  Drain Location: RLQ Size: Fr size: 12 Fr Date of placement: 04/18/21 (Dr. Pascal Lux)  Currently to: [facility] collection device: gravity 24 hour output:  Output by Drain (mL) 04/17/21 0701 - 04/17/21 1900 04/17/21 1901 - 04/18/21 0700 04/18/21 0701 - 04/18/21 1900 04/18/21 1901 - 04/19/21 0700 04/19/21 0701 - 04/19/21 1512  Closed System Drain 1 Right RLQ Other (Comment) 12 Fr.   150 25  Interval imaging/drain manipulation:  None  Current examination: Flushes/aspirates easily.  Insertion site unremarkable. Suture and stat lock in place. Dressed appropriately.   Plan: Continue TID flushes with 5 cc NS. Record output Q shift. Dressing changes QD or PRN if soiled.  Call IR APP or on call IR MD if difficulty flushing or sudden change in drain output.  Repeat imaging/possible drain injection once output < 10 mL/QD (excluding flush material.)  Discharge planning: Please contact IR APP or on call IR MD prior to patient d/c to ensure appropriate follow up plans are in place. Typically patient will follow up with IR clinic 10-14 days post d/c for repeat imaging/possible drain injection. IR scheduler will contact patient with date/time of appointment. Patient will need to flush drain QD with 5 cc NS, record output QD, dressing changes every 2-3 days or earlier if soiled.   IR will continue to follow -  please call with questions or concerns.  Electronically Signed: Joaquim Nam, PA-C 04/19/2021, 3:05 PM   I spent a total of 15 Minutes at the the patient's bedside AND on the patient's hospital floor or unit, greater than 50% of which was counseling/coordinating care for RLQ drain follow up.

## 2021-04-19 NOTE — Progress Notes (Signed)
[facility] PROGRESS NOTE   Scott Foley PPJ:[id number] DOB: 1965/03/16 DOA: 04/13/2021  6 DOS: the patient was seen and examined on 04/19/2021  PCP: [name], MD  Brief History and Hospital Course:  56 y.o. male with medical history significant of hx of CAD, HTN, hx of PE on eliquis, HLD, hxo of CVA in 2014, IDA with history of recent hospitalizaion on 03/16/21 for recurrent pancreatitis, sepsis due to bacteremia from klebsiella pneumoniae, covid-19 and respiratory failure requiring home oxygen on discharge.  Presented with a 4-day history of diffuse worsening abdominal pain.  He has had abdominal issues for the past 2 months.  He had a cholecystectomy on 12/21 and underwent ERCP on 12/22 since the intraoperative cholangiogram showed a CBD stone.  After ERCP he developed pancreatitis.  He was managed conservatively and then discharged home on 12/28.  He was readmitted to [facility] on 1/4 for recurrent pancreatitis and COVID-19.  Noted to have sepsis respiratory failure.  Blood cultures were positive for Klebsiella pneumonia.  He was treated with Invanz for 10 days.  Discharged on 2 L of oxygen. He had a postop visit with surgeon on 04/12/21 when he complained of abdominal pain.  A CT scan was subsequently done which showed pancreatic necrosis with gas within the pancreatic fluid collections.  He was sent over to [facility] emergency department.  He was seen by general surgery.  Hospitalized for further management.  Placed on antibiotics.  Also noted to have acute kidney injury.  Denied any significant alcohol use. Patient is seen imaging and surgery as well as gastroenterology.  Interventional radiology was consulted for aspiration of the fluid collection which was performed on 2/6.  Consultants: General surgery.  Gastroenterology.  Interventional radiology  Procedures: None    Subjective: Patient feels well.  Slept reasonably well overnight.  Abdominal pain is well  controlled.  No nausea vomiting.  No shortness of breath or chest pain.   Assessment/Plan:  * Necrotizing pancreatitis- (present on admission) This is either a third episode of pancreatitis or sequelae of pancreatitis from his episode in January.  His lipase level was noted to be normal.  He does have significant leukocytosis.  CT scan was done at [facility].  Do not have access to that report but according to the H&P it showed progressive severe pancreatitis with extensive fluid collection surrounding the pancreas and extending into the right retroperitoneum.  Many of these fluid collections containing gas concerning for infection.  Compression of splenic vein and SMV was noted although they appear to be patent.  Patient was started on meropenem.   Gastroenterology and general surgery have been consulted.  No role for endoscopic drainage at this time.  Interventional radiology consulted for percutaneous drainage which was done on 2/6 after Plavix washout.   Patient is stable.  Pain is reasonably well controlled.  He is tolerating his tube feedings.  Sepsis ([place])- (present on admission) On admission he met sepsis criteria with tachycardia, fever to 100.8, WBC to 18.2.  Recently noted to have Klebsiella bacteremia earlier in January when he was hospitalized in [place].  Blood cultures done here in the hospital have been negative so far. Sepsis physiology appears to have improved.  WBC is better.  Remains afebrile.  Follow-up on cultures from fluid sent after aspiration yesterday.  Hypokalemia- (present on admission) Hypomagnesemia  Potassium dropped down to 2.8 yesterday.  Was aggressively repleted.  Noted to be 3.8 today.  Magnesium is 1.6 and will be repleted.  AKI (acute kidney injury) ([place])- (present on admission) Presented with creatinine of 2.49. This was likely prerenal in the setting of sepsis along with nausea and vomiting over the past few days.   Patient aggressively hydrated.   Renal function has improved and back to normal now.  Monitor urine output.    Normocytic anemia- (present on admission) Significant drop in hemoglobin noted most of which appears to be dilutional.   Anemia panel reviewed.  Vitamin B12 noted to be low normal at 254.  Being supplemented.  Folate 8.2.  Ferritin 1033.  TIBC 137.  Iron 16. Hemoglobin low but stable.  No overt bleeding identified.  History of pulmonary embolism- (present on admission) Eliquis is currently on hold.  He is on heparin infusion.    Chronic respiratory failure with hypoxia ([place])- (present on admission) Recently recovered from COVID-19 infection.  Was discharged on 2 L of oxygen by nasal cannula.  Chest x-ray continues to show opacities which could be from his recent infection rather than any infection.   Respiratory status is stable.  Chronic obstructive pulmonary disease ([place])- (present on admission) No signs of exacerbation. Continue trelegy daily and albuterol prn.  Essential hypertension- (present on admission) Sinus tachycardia  ACE inhibitor on hold.  Borderline low blood pressures noted.   Sinus tachycardia likely secondary to severe illness. He was started back on beta-blocker due to concern for rebound tachycardia.  Heart rate is better.  Hyperlipemia- (present on admission) Statin on hold currently.  Triglyceride was 121 in December.  Coronary artery disease with stable angina pectoris ([place])- (present on admission) Stable.  Plavix currently on hold.   Protein-calorie malnutrition, severe Started on tube feedings which she is tolerating well.  Continue to monitor.  Hypoglycemia Resolved with initiation of fluids and nutrition.   Obesity Estimated body mass index is 32.1 kg/m as calculated from the following:   Height as of this encounter: '5\' 4"'  (1.626 m).   Weight as of this encounter: 84.8 kg.   DVT Prophylaxis: On IV heparin Code Status: Full code Family Communication: Discussed with  patient Disposition Plan: To be determined  Status is: Inpatient Remains inpatient appropriate because: Sepsis with pancreatic necrosis with infection        Medications: Scheduled:  feeding supplement (PROSource TF)  45 mL Per Tube BID   fluticasone furoate-vilanterol  1 puff Inhalation Daily   And   umeclidinium bromide  1 puff Inhalation Daily   free water  30 mL Per Tube Q4H   iohexol  100 mL Intravenous Once   metoprolol tartrate  25 mg Oral BID   pantoprazole  40 mg Oral BID   sodium chloride flush  5 mL Intracatheter Q8H   vitamin B-12  1,000 mcg Per Tube Daily   Continuous:  dextrose 5 % and 0.9% NaCl 50 mL/hr at 04/19/21 0940   feeding supplement (VITAL 1.5 CAL) 1,000 mL (04/18/21 1137)   heparin 2,150 Units/hr (04/19/21 0935)   meropenem (MERREM) IV 1 g (04/19/21 0935)   XNA:TFTDDUKGURKYH, albuterol, morphine injection, ondansetron **OR** ondansetron (ZOFRAN) IV, oxyCODONE  Antibiotics: Anti-infectives (From admission, onward)    Start     Dose/Rate Route Frequency Ordered Stop   04/16/21 1600  meropenem (MERREM) 1 g in sodium chloride 0.9 % 100 mL IVPB        1 g 200 mL/hr over 30 Minutes Intravenous Every 8 hours 04/16/21 1234     04/13/21 2000  meropenem (MERREM) 1 g in sodium chloride 0.9 % 100  mL IVPB  Status:  Discontinued        1 g 200 mL/hr over 30 Minutes Intravenous Every 12 hours 04/13/21 1934 04/16/21 1234   04/13/21 1800  ceFEPIme (MAXIPIME) 2 g in sodium chloride 0.9 % 100 mL IVPB  Status:  Discontinued        2 g 200 mL/hr over 30 Minutes Intravenous Every 12 hours 04/13/21 1731 04/13/21 1852   04/13/21 1715  levofloxacin (LEVAQUIN) IVPB 750 mg  Status:  Discontinued        750 mg 100 mL/hr over 90 Minutes Intravenous  Once 04/13/21 1713 04/13/21 1728   04/13/21 1715  metroNIDAZOLE (FLAGYL) IVPB 500 mg  Status:  Discontinued        500 mg 100 mL/hr over 60 Minutes Intravenous  Once 04/13/21 1713 04/13/21 1852       Objective:  Vital  Signs  Vitals:   04/19/21 0250 04/19/21 0350 04/19/21 0806 04/19/21 0835  BP: 110/73 122/76 95/71   Pulse: (!) 105     Resp: (!) 30 20    Temp:  98 F (36.7 C) 98.3 F (36.8 C)   TempSrc:  Oral    SpO2: 95% 96%  93%  Weight:      Height:        Intake/Output Summary (Last 24 hours) at 04/19/2021 1017 Last data filed at 04/19/2021 3710 Gross per 24 hour  Intake 2920.48 ml  Output 100 ml  Net 2820.48 ml    Filed Weights   04/15/21 1400  Weight: 84.8 kg    General appearance: Awake alert.  In no distress Resp: Clear to auscultation bilaterally.  Normal effort Cardio: S1-S2 is normal regular.  No S3-S4.  No rubs murmurs or bruit GI: Abdomen is soft.  Tender in the epigastric area.  Bowel sounds present.  No masses organomegaly. Extremities: No edema.  Full range of motion of lower extremities. Neurologic: Alert and oriented x3.  No focal neurological deficits.      Lab Results:  Data Reviewed: I have personally reviewed labs and imaging study reports  CBC: Recent Labs  Lab 04/13/21 1523 04/14/21 0313 04/15/21 1653 04/16/21 0649 04/17/21 0341 04/18/21 0251 04/19/21 0533  WBC 18.2*   < > 16.1* 17.2* 16.9* 15.0* 14.7*  NEUTROABS 14.5*  --   --   --   --   --   --   HGB 11.1*   < > 8.9* 8.6* 8.3* 7.6* 8.0*  HCT 34.2*   < > 27.5* 27.5* 25.6* 23.3* 24.2*  MCV 85.9   < > 85.9 89.6 88.0 86.9 86.4  PLT 651*   < > 507* 547* 510* 396 363   < > = values in this interval not displayed.     Basic Metabolic Panel: Recent Labs  Lab 04/13/21 1854 04/14/21 0230 04/15/21 0153 04/16/21 0649 04/17/21 0637 04/18/21 0251 04/19/21 0533  NA 130*   < > 137 137 138 135 139  K 3.4*   < > 3.0* 3.6 3.2* 2.8* 3.8  CL 96*   < > 99 103 105 102 107  CO2 20*   < > '25 24 25 25 23  ' GLUCOSE 89   < > 64* 103* 126* 88 120*  BUN 22*   < > '18 14 14 14 14  ' CREATININE 2.19*   < > 1.79* 1.37* 1.14 1.05 0.99  CALCIUM 7.9*   < > 8.4* 8.3* 7.7* 7.3* 7.6*  MG 1.6*  --  1.4*  --  1.5* 2.1 1.6*    < > = values in this interval not displayed.     GFR: Estimated Creatinine Clearance: 81.8 mL/min (by C-G formula based on SCr of 0.99 mg/dL).  Liver Function Tests: Recent Labs  Lab 04/14/21 0230 04/15/21 0153 04/16/21 0649 04/17/21 0637 04/18/21 0251  AST '20 17 18 16 24  ' ALT '25 18 17 15 17  ' ALKPHOS 113 98 92 67 77  BILITOT 0.3 0.6 0.3 0.2* 0.4  PROT 6.5 5.6* 5.8* 5.3* 4.8*  ALBUMIN 2.0* 1.7* 1.7* 1.5* <1.5*     Recent Labs  Lab 04/13/21 1523  LIPASE 36     Coagulation Profile: Recent Labs  Lab 04/13/21 1523 04/15/21 0153 04/16/21 0649 04/17/21 0341  INR 2.4* 1.6* 1.4* 1.5*      Recent Results (from the past 240 hour(s))  Resp Panel by RT-PCR (Flu A&B, Covid) Nasopharyngeal Swab     Status: None   Collection Time: 04/13/21  3:22 PM   Specimen: Nasopharyngeal Swab; Nasopharyngeal(NP) swabs in vial transport medium  Result Value Ref Range Status   SARS Coronavirus 2 by RT PCR NEGATIVE NEGATIVE Final    Comment: (NOTE) SARS-CoV-2 target nucleic acids are NOT DETECTED.  The SARS-CoV-2 RNA is generally detectable in upper respiratory specimens during the acute phase of infection. The lowest concentration of SARS-CoV-2 viral copies this assay can detect is 138 copies/mL. A negative result does not preclude SARS-Cov-2 infection and should not be used as the sole basis for treatment or other patient management decisions. A negative result may occur with  improper specimen collection/handling, submission of specimen other than nasopharyngeal swab, presence of viral mutation(s) within the areas targeted by this assay, and inadequate number of viral copies(<138 copies/mL). A negative result must be combined with clinical observations, patient history, and epidemiological information. The expected result is Negative.  Fact Sheet for Patients:  EntrepreneurPulse.com.au  Fact Sheet for Healthcare Providers:   IncredibleEmployment.be  This test is no t yet approved or cleared by the [place] FDA and  has been authorized for detection and/or diagnosis of SARS-CoV-2 by FDA under an Emergency Use Authorization (EUA). This EUA will remain  in effect (meaning this test can be used) for the duration of the COVID-19 declaration under Section 564(b)(1) of the Act, 21 U.S.C.section 360bbb-3(b)(1), unless the authorization is terminated  or revoked sooner.       Influenza A by PCR NEGATIVE NEGATIVE Final   Influenza B by PCR NEGATIVE NEGATIVE Final    Comment: (NOTE) The Xpert Xpress SARS-CoV-2/FLU/RSV plus assay is intended as an aid in the diagnosis of influenza from Nasopharyngeal swab specimens and should not be used as a sole basis for treatment. Nasal washings and aspirates are unacceptable for Xpert Xpress SARS-CoV-2/FLU/RSV testing.  Fact Sheet for Patients: EntrepreneurPulse.com.au  Fact Sheet for Healthcare Providers: IncredibleEmployment.be  This test is not yet approved or cleared by the [place] FDA and has been authorized for detection and/or diagnosis of SARS-CoV-2 by FDA under an Emergency Use Authorization (EUA). This EUA will remain in effect (meaning this test can be used) for the duration of the COVID-19 declaration under Section 564(b)(1) of the Act, 21 U.S.C. section 360bbb-3(b)(1), unless the authorization is terminated or revoked.  Performed at [facility], [place] [address] [place], [place] [address]   Blood Culture (routine x 2)     Status: None   Collection Time: 04/13/21  3:24 PM   Specimen: BLOOD  Result Value Ref Range Status  Specimen Description BLOOD SITE NOT SPECIFIED  Final   Special Requests   Final    BOTTLES DRAWN AEROBIC AND ANAEROBIC Blood Culture adequate volume   Culture   Final    NO GROWTH 5 DAYS Performed at [facility], [address], [place], [place] [address]     Report Status 04/18/2021 FINAL  Final  Blood Culture (routine x 2)     Status: None   Collection Time: 04/13/21  5:55 PM   Specimen: BLOOD  Result Value Ref Range Status   Specimen Description BLOOD LEFT ANTECUBITAL  Final   Special Requests   Final    BOTTLES DRAWN AEROBIC AND ANAEROBIC Blood Culture results may not be optimal due to an excessive volume of blood received in culture bottles   Culture   Final    NO GROWTH 5 DAYS Performed at [facility], [place] [address], [place], [place] [address]    Report Status 04/18/2021 FINAL  Final  Urine Culture     Status: None   Collection Time: 04/13/21  6:33 PM   Specimen: Urine, Clean Catch  Result Value Ref Range Status   Specimen Description URINE, CLEAN CATCH  Final   Special Requests NONE  Final   Culture   Final    NO GROWTH Performed at [facility], [place] [address], [place], [place] [address]    Report Status 04/15/2021 FINAL  Final  Aerobic/Anaerobic Culture w Gram Stain (surgical/deep wound)     Status: None (Preliminary result)   Collection Time: 04/18/21 10:01 AM   Specimen: Abscess  Result Value Ref Range Status   Specimen Description ABSCESS  Final   Special Requests DRAIN  Final   Gram Stain   Final    MODERATE WBC PRESENT, PREDOMINANTLY MONONUCLEAR NO ORGANISMS SEEN Performed at [facility], [place] [address], [place], [place] [address]    Culture PENDING  Incomplete   Report Status PENDING  Incomplete       Radiology Studies: CT ABDOMEN PELVIS W CONTRAST  Result Date: 04/18/2021 CLINICAL DATA:  History of necrotic pancreatitis in a 56 year old male, concern for abscess. EXAM: CT ABDOMEN AND PELVIS WITH CONTRAST TECHNIQUE: Multidetector CT imaging of the abdomen and pelvis was performed using the standard protocol following bolus administration of intravenous contrast. RADIATION DOSE REDUCTION: This exam was performed according to the departmental dose-optimization program which includes automated  exposure control, adjustment of the mA and/or kV according to patient size and/or use of iterative reconstruction technique. CONTRAST:  27m OMNIPAQUE IOHEXOL 300 MG/ML  SOLN COMPARISON:  April 12, 2021. FINDINGS: Lower chest: Basilar volume loss and bilateral pleural effusions have developed since the prior study. More consolidative appearance in the LEFT lower lobe. Effusions are present, small to moderate on the LEFT and small on the RIGHT. w subtle patchy airspace opacities are noted with similar appearance within aerated portions of the lung compared to the most recent comparison study. Hepatobiliary: No focal, suspicious hepatic lesion. Suspect background hepatic steatosis. Post cholecystectomy. No substantial biliary duct dilation. High-grade narrowing of the portal vein just below the level of the portal-splenic confluence is similar to the prior exam. SMV beyond this level does appear patent but there is substantial surrounding inflammatory change in the setting of sequela of pancreatitis with walled-off necrosis involving the gland and the peripancreatic fat. Some adjacent stranding could reflect superimposed acute interstitial edematous changes as well. There is a band of hypoenhancement that travels through the pancreatic  head indicative of glandular necrosis (image 37/3) Pancreas: Findings outlined partially above with respect to glandular and peripancreatic necrosis and with walled off necrosis showing peripheral enhancement and gas present throughout the pancreatic bed adjacent to the body, neck and head of the pancreas and tracking into the RIGHT retroperitoneum and root of the small bowel mesentery. Collection tracking into the transverse mesocolon (image 35/3) 5.6 x 5.8 cm, within 1-2 mm when measured in a similar fashion. Additional communicating area in the anterior lesser sac (image 30/3) 5.0 x 3.0 cm previously 6.1 x 3.1 cm. Fluid tracking anterior to the pancreas and inferior to the pancreas  about the body-tail junction is also similar and also containing gas measuring up to 2.6 cm greatest thickness. Walled-off necrosis of fat that extends throughout the anterior pararenal space and tracks into the RIGHT lower quadrant of the pelvis where there is a discrete collection with peripheral enhancement measuring 5.1 x 4.7 cm previously 6.6 x 5.3 cm. This is inseparable from the RIGHT iliacus muscle. Spleen: Splenic vein is highly narrowed. Spleen enhances normally aside from very subtle low attenuation at the periphery which is unchanged potentially a tiny splenic infarct (image 25/3) up to 9 mm. Adrenals/Urinary Tract: Adrenal glands are unremarkable. Symmetric renal enhancement. No sign of hydronephrosis. No suspicious renal lesion or perinephric stranding. Urinary bladder is grossly unremarkable. Stomach/Bowel: Enteric tube in place terminating just beyond the ligament of Treitz. No signs of small bowel obstruction. Inflammation extends along the LEFT flank as well about the anterior renal fascia and lateral conal fascia with a nodular appearance that is not changed. These inflammatory changes do not cause colonic narrowing. The appendix is normal. Vascular/Lymphatic: Chronic appearing focal dilation of the infrarenal abdominal aorta measuring 3.3 cm with associated mural thrombus is unchanged measuring approximately 3.3 cm greatest axial dimension. Stable since December of 2020. There is no gastrohepatic or hepatoduodenal ligament lymphadenopathy. No retroperitoneal or mesenteric lymphadenopathy. No pelvic sidewall lymphadenopathy. Reproductive: Unremarkable by CT. Other: No ascites. Signs of body wall edema which have developed since the previous study. This is along the RIGHT and LEFT flank. Musculoskeletal: Chronic T12 loss of height approximately 40% and associated degenerative changes throughout the spine without change. IMPRESSION: 1. Signs of glandular and peripancreatic necrosis with walled-off  necrosis involving the gland and the peripancreatic fat. Inflammatory changes track into the retroperitoneum and lesser sac as well as transverse mesocolon and show mild improvement in some areas as discussed above, still with luminal gas and or peripheral enhancement not allowing for exclusion of concomitant infection. 2. 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 3. Extensive nodular inflammatory changes in the LEFT retroperitoneum without focal collection 4. High-grade narrowing of the SMV/portal vein greatest below the portal splenic confluence is similar to the prior exam, attention on follow-up. Main portal vein remains patent into the liver. Peripheral branches of the SMV also appear patent. 5. Concomitant high-grade narrowing of the splenic vein 6. Focal rightward infrarenal abdominal aortic dilation with associated mural thrombus/extensive soft plaque extends 12 mm beyond the expected lumen and shows little change with a maximal caliber of 3.3 cm since 2020. Based on the focal appearance penetrating atherosclerotic ulcer or chronic pseudoaneurysm are considered. CT angiography at 1 year interval may be helpful given the focal nature of this finding. Vascular consultation could also be beneficial on follow-up. Aortic Atherosclerosis (ICD10-I70.0). Electronically Signed   By: Zetta Bills M.D.   On: 04/18/2021  09:36   CT IMAGE GUIDED DRAINAGE BY PERCUTANEOUS CATHETER  Result Date: 04/18/2021 INDICATION: History of pancreatitis and pseudocyst formation following ERCP with persistent elevated white blood cell count. Patient has been evaluated by the hepatobiliary service (Dr. Zenia Resides), and request has been made for placement of a percutaneous drainage catheter for infection source control purposes. EXAM: CT IMAGE GUIDED DRAINAGE BY PERCUTANEOUS CATHETER COMPARISON:  CT abdomen pelvis-04/18/2021; 04/13/2021 MEDICATIONS: The  patient is currently admitted to the hospital and receiving intravenous antibiotics. The antibiotics were administered within an appropriate time frame prior to the initiation of the procedure. ANESTHESIA/SEDATION: Moderate (conscious) sedation was employed during this procedure as administered by the Interventional Radiology RN. A total of Versed 2 mg and Fentanyl 100 mcg was administered intravenously. Moderate Sedation Time: 18 minutes. The patient's level of consciousness and vital signs were monitored continuously by radiology nursing throughout the procedure under my direct supervision. CONTRAST:  None COMPLICATIONS: None immediate. PROCEDURE: RADIATION DOSE REDUCTION: This exam was performed according to the departmental dose-optimization program which includes automated exposure control, adjustment of the mA and/or kV according to patient size and/or use of iterative reconstruction technique. Informed written consent was obtained from the patient after a discussion of the risks, benefits and alternatives to treatment. The patient was placed supine on the CT gantry and a pre procedural CT was performed re-demonstrating the known abscess/fluid collection within the pancreatic bed, extending along right iliopsoas musculature to the right lower abdomen with dominant component within the right lower abdomen measuring approximately 5.6 x 5.0 cm (image 49, series 2). The procedure was planned. A timeout was performed prior to the initiation of the procedure. The skin overlying the inferolateral aspect of the right mid abdomen was prepped and draped in the usual sterile fashion. The overlying soft tissues were anesthetized with 1% lidocaine with epinephrine. Appropriate trajectory was planned with the use of a 22 gauge spinal needle. An 18 gauge trocar needle was advanced into the abscess/fluid collection and a short Amplatz super stiff wire was coiled within the collection. Appropriate positioning was confirmed  with a limited CT scan. The tract was serially dilated allowing placement of a 12 [place] all-purpose drainage catheter. Appropriate positioning was confirmed with a limited postprocedural CT scan. Approximately 75 ml of thick, dark tan colored fluid was aspirated. The tube was connected to a drainage bag and sutured in place. A dressing was placed. The patient tolerated the procedure well without immediate post procedural complication. IMPRESSION: Successful CT guided placement of a 9 French all purpose drain catheter into the right lower abdominal quadrant pseudocyst with aspiration of 75 mL of thick, dark tan colored fluid. Samples were sent to the laboratory as requested by the ordering clinical team. Electronically Signed   By: Sandi Mariscal M.D.   On: 04/18/2021 10:51       LOS: 6 days   [facility] Pager on [email or web address]  04/19/2021, 10:17 AM

## 2021-04-19 NOTE — Evaluation (Signed)
Physical Therapy Evaluation Patient Details Name: Scott Foley MRN: [id number] DOB: 02/02/1966 Today's Date: 04/19/2021  History of Present Illness  Pt is a 56 y.o. M who presents 04/13/2021 with abdominal pain. Admitted with acute pancreatitis with sepsis. S/p lap chole 03/02/2021 with abnormal IOC; underwent ERCP 12/22/222. S/p IR pec drain. Significant PMH: CAD, HTN, HLD, COPD, PE, CVA, IDA, tobacco abuse.  Clinical Impression  PTA, pt lives with his family in a mobile home with 3 steps to enter and is independent. Pt reports fair pain control. Ambulating 120 feet with no assistive device at a min guard assist level. SpO2 > 90% on RA, HR peak 120 bpm. Pt presents with decreased cardiopulmonary endurance and weakness. Will benefit from continued PT to address.     Recommendations for follow up therapy are one component of a multi-disciplinary discharge planning process, led by the attending physician.  Recommendations may be updated based on patient status, additional functional criteria and insurance authorization.  Follow Up Recommendations Home health PT    Assistance Recommended at Discharge PRN  Patient can return home with the following  A little help with walking and/or transfers;A little help with bathing/dressing/bathroom;Help with stairs or ramp for entrance    Equipment Recommendations None recommended by PT  Recommendations for Other Services       Functional Status Assessment Patient has had a recent decline in their functional status and demonstrates the ability to make significant improvements in function in a reasonable and predictable amount of time.     Precautions / Restrictions Precautions Precautions: Fall;Other (comment) Precaution Comments: NGT, R drain Restrictions Weight Bearing Restrictions: No      Mobility  Bed Mobility Overal bed mobility: Modified Independent                  Transfers Overall transfer level: Needs  assistance Equipment used: None Transfers: Sit to/from Stand Sit to Stand: Supervision                Ambulation/Gait Ambulation/Gait assistance: Min guard Gait Distance (Feet): 120 Feet Assistive device: None Gait Pattern/deviations: Decreased stride length, Step-through pattern Gait velocity: decreased Gait velocity interpretation: <1.8 ft/sec, indicate of risk for recurrent falls   General Gait Details: slow but steady pace, decreased bilateral foot clearance, min guard for safety  Stairs            Wheelchair Mobility    Modified Rankin (Stroke Patients Only)       Balance Overall balance assessment: Mild deficits observed, not formally tested                                           Pertinent Vitals/Pain Pain Assessment Pain Assessment: Faces Faces Pain Scale: Hurts a little bit Pain Location: abdomen Pain Descriptors / Indicators: Sore Pain Intervention(s): Monitored during session    Home Living Family/patient expects to be discharged to:: Private residence Living Arrangements: Spouse/significant other;Children (6 y.o. 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)                     Journalist, newspaper        Extremity/Trunk  Assessment   Upper Extremity Assessment Upper Extremity Assessment: Defer to OT evaluation    Lower Extremity Assessment Lower Extremity Assessment: Generalized weakness       Communication   Communication: No difficulties  Cognition Arousal/Alertness: Awake/alert Behavior During Therapy: WFL for tasks assessed/performed Overall Cognitive Status: Within Functional Limits for tasks assessed                                           General Comments      Exercises     Assessment/Plan    PT Assessment Patient needs continued PT services  PT Problem List Decreased strength;Decreased activity tolerance;Decreased balance;Decreased mobility;Pain       PT Treatment Interventions DME instruction;Gait training;Stair training;Functional mobility training;Therapeutic activities;Therapeutic exercise;Balance training;Patient/family education    PT Goals (Current goals can be found in the Care Plan section)  Acute Rehab PT Goals Patient Stated Goal: advanced diet PT Goal Formulation: With patient Time For Goal Achievement: 05/03/21 Potential to Achieve Goals: Good    Frequency Min 3X/week     Co-evaluation               AM-PAC PT "6 Clicks" Mobility  Outcome Measure Help needed turning from your back to your side while in a flat bed without using bedrails?: None Help needed moving from lying on your back to sitting on the side of a flat bed without using bedrails?: None Help needed moving to and from a bed to a chair (including a wheelchair)?: A Little Help needed standing up from a chair using your arms (e.g., wheelchair or bedside chair)?: A Little Help needed to walk in hospital room?: A Little Help needed climbing 3-5 steps with a railing? : A Lot 6 Click Score: 19    End of Session   Activity Tolerance: Patient tolerated treatment well Patient left: in bed;with call bell/phone within reach;with bed alarm set;Other (comment) (per pt request) Nurse Communication: Mobility status PT Visit Diagnosis: Unsteadiness on feet (R26.81);Muscle weakness (generalized) (M62.81);Difficulty in walking, not elsewhere classified (R26.2);Pain Pain - part of body:  (abdomen)    Time: 6203-5597 PT Time Calculation (min) (ACUTE ONLY): 22 min   Charges:   PT Evaluation $PT Eval Moderate Complexity: 1 Mod          Wyona Almas, PT, DPT Acute Rehabilitation Services Pager [phone] Office  [phone]   [name] 04/19/2021, 5:23 PM

## 2021-04-19 NOTE — Progress Notes (Signed)
Patient ID: Numair Masden, male   DOB: 1966/02/26, 56 y.o.   MRN: [id number] [facility] Surgery Progress Note     Subjective: CC-  Abdominal pain improving daily though still mild and present today in lower abdomen. No nausea or emesis and having bowel function. Has sipped on water without increased pain  Objective: Vital signs in last 24 hours: Temp:  [98 F (36.7 C)-98.3 F (36.8 C)] 98.3 F (36.8 C) (02/07 0806) Pulse Rate:  [99-118] 105 (02/07 0250) Resp:  [18-31] 20 (02/07 0350) BP: (95-125)/(65-86) 95/71 (02/07 0806) SpO2:  [89 %-97 %] 93 % (02/07 0835) Last BM Date: 04/17/21  Intake/Output from previous day: 02/06 0701 - 02/07 0700 In: 2920.5 [I.V.:1070.5; XI/PJ:8250.5; IV Piggyback:300.1] Out: 175 [Drains:175] Intake/Output this shift: No intake/output data recorded.  PE: Gen:  Alert, NAD, pleasant Card: rrr Pulm:  rate and effort normal Abd: Soft, ND, mild TTP - worse in bilateral lower abdomen, no peritonitis, no epigastric pain to palpation at time of my exam. IR perc drain RLQ with blood tinged purulent fluid   Lab Results:  Recent Labs    04/18/21 0251 04/19/21 0533  WBC 15.0* 14.7*  HGB 7.6* 8.0*  HCT 23.3* 24.2*  PLT 396 363    BMET Recent Labs    04/18/21 0251 04/19/21 0533  NA 135 139  K 2.8* 3.8  CL 102 107  CO2 25 23  GLUCOSE 88 120*  BUN 14 14  CREATININE 1.05 0.99  CALCIUM 7.3* 7.6*    PT/INR Recent Labs    04/17/21 0341  LABPROT 17.8*  INR 1.5*    CMP     Component Value Date/Time   NA 139 04/19/2021 0533   NA 140 04/01/2021 1047   K 3.8 04/19/2021 0533   CL 107 04/19/2021 0533   CO2 23 04/19/2021 0533   GLUCOSE 120 (H) 04/19/2021 0533   BUN 14 04/19/2021 0533   BUN 24 04/01/2021 1047   CREATININE 0.99 04/19/2021 0533   CALCIUM 7.6 (L) 04/19/2021 0533   PROT 4.8 (L) 04/18/2021 0251   PROT 6.5 04/01/2021 1047   ALBUMIN <1.5 (L) 04/18/2021 0251   ALBUMIN 3.1 (L) 04/01/2021 1047   AST 24 04/18/2021  0251   ALT 17 04/18/2021 0251   ALKPHOS 77 04/18/2021 0251   BILITOT 0.4 04/18/2021 0251   BILITOT 0.6 04/01/2021 1047   GFRNONAA >60 04/19/2021 0533   GFRAA 105 12/15/2019 1034   Lipase     Component Value Date/Time   LIPASE 36 04/13/2021 1523       Studies/Results: CT ABDOMEN PELVIS W CONTRAST  Result Date: 04/18/2021 CLINICAL DATA:  History of necrotic pancreatitis in a 56 year old male, concern for abscess. EXAM: CT ABDOMEN AND PELVIS WITH CONTRAST TECHNIQUE: Multidetector CT imaging of the abdomen and pelvis was performed using the standard protocol following bolus administration of intravenous contrast. RADIATION DOSE REDUCTION: This exam was performed according to the departmental dose-optimization program which includes automated exposure control, adjustment of the mA and/or kV according to patient size and/or use of iterative reconstruction technique. CONTRAST:  84mL OMNIPAQUE IOHEXOL 300 MG/ML  SOLN COMPARISON:  April 12, 2021. FINDINGS: Lower chest: Basilar volume loss and bilateral pleural effusions have developed since the prior study. More consolidative appearance in the LEFT lower lobe. Effusions are present, small to moderate on the LEFT and small on the RIGHT. w subtle patchy airspace opacities are noted with similar appearance within aerated portions of the lung compared to the most recent comparison  study. Hepatobiliary: No focal, suspicious hepatic lesion. Suspect background hepatic steatosis. Post cholecystectomy. No substantial biliary duct dilation. High-grade narrowing of the portal vein just below the level of the portal-splenic confluence is similar to the prior exam. SMV beyond this level does appear patent but there is substantial surrounding inflammatory change in the setting of sequela of pancreatitis with walled-off necrosis involving the gland and the peripancreatic fat. Some adjacent stranding could reflect superimposed acute interstitial edematous changes as  well. There is a band of hypoenhancement that travels through the pancreatic head indicative of glandular necrosis (image 37/3) Pancreas: Findings outlined partially above with respect to glandular and peripancreatic necrosis and with walled off necrosis showing peripheral enhancement and gas present throughout the pancreatic bed adjacent to the body, neck and head of the pancreas and tracking into the RIGHT retroperitoneum and root of the small bowel mesentery. Collection tracking into the transverse mesocolon (image 35/3) 5.6 x 5.8 cm, within 1-2 mm when measured in a similar fashion. Additional communicating area in the anterior lesser sac (image 30/3) 5.0 x 3.0 cm previously 6.1 x 3.1 cm. Fluid tracking anterior to the pancreas and inferior to the pancreas about the body-tail junction is also similar and also containing gas measuring up to 2.6 cm greatest thickness. Walled-off necrosis of fat that extends throughout the anterior pararenal space and tracks into the RIGHT lower quadrant of the pelvis where there is a discrete collection with peripheral enhancement measuring 5.1 x 4.7 cm previously 6.6 x 5.3 cm. This is inseparable from the RIGHT iliacus muscle. Spleen: Splenic vein is highly narrowed. Spleen enhances normally aside from very subtle low attenuation at the periphery which is unchanged potentially a tiny splenic infarct (image 25/3) up to 9 mm. Adrenals/Urinary Tract: Adrenal glands are unremarkable. Symmetric renal enhancement. No sign of hydronephrosis. No suspicious renal lesion or perinephric stranding. Urinary bladder is grossly unremarkable. Stomach/Bowel: Enteric tube in place terminating just beyond the ligament of Treitz. No signs of small bowel obstruction. Inflammation extends along the LEFT flank as well about the anterior renal fascia and lateral conal fascia with a nodular appearance that is not changed. These inflammatory changes do not cause colonic narrowing. The appendix is normal.  Vascular/Lymphatic: Chronic appearing focal dilation of the infrarenal abdominal aorta measuring 3.3 cm with associated mural thrombus is unchanged measuring approximately 3.3 cm greatest axial dimension. Stable since December of 2020. There is no gastrohepatic or hepatoduodenal ligament lymphadenopathy. No retroperitoneal or mesenteric lymphadenopathy. No pelvic sidewall lymphadenopathy. Reproductive: Unremarkable by CT. Other: No ascites. Signs of body wall edema which have developed since the previous study. This is along the RIGHT and LEFT flank. Musculoskeletal: Chronic T12 loss of height approximately 40% and associated degenerative changes throughout the spine without change. IMPRESSION: 1. Signs of glandular and peripancreatic necrosis with walled-off necrosis involving the gland and the peripancreatic fat. Inflammatory changes track into the retroperitoneum and lesser sac as well as transverse mesocolon and show mild improvement in some areas as discussed above, still with luminal gas and or peripheral enhancement not allowing for exclusion of concomitant infection. 2. 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 3. Extensive nodular inflammatory changes in the LEFT retroperitoneum without focal collection 4. High-grade narrowing of the SMV/portal vein greatest below the portal splenic confluence is similar to the prior exam, attention on follow-up. Main portal vein remains patent into the liver. Peripheral branches of the SMV also appear patent. 5. Concomitant  high-grade narrowing of the splenic vein 6. Focal rightward infrarenal abdominal aortic dilation with associated mural thrombus/extensive soft plaque extends 12 mm beyond the expected lumen and shows little change with a maximal caliber of 3.3 cm since 2020. Based on the focal appearance penetrating atherosclerotic ulcer or chronic pseudoaneurysm are considered. CT  angiography at 1 year interval may be helpful given the focal nature of this finding. Vascular consultation could also be beneficial on follow-up. Aortic Atherosclerosis (ICD10-I70.0). Electronically Signed   By: Zetta Bills M.D.   On: 04/18/2021 09:36   CT IMAGE GUIDED DRAINAGE BY PERCUTANEOUS CATHETER  Result Date: 04/18/2021 INDICATION: History of pancreatitis and pseudocyst formation following ERCP with persistent elevated white blood cell count. Patient has been evaluated by the hepatobiliary service (Dr. Zenia Resides), and request has been made for placement of a percutaneous drainage catheter for infection source control purposes. EXAM: CT IMAGE GUIDED DRAINAGE BY PERCUTANEOUS CATHETER COMPARISON:  CT abdomen pelvis-04/18/2021; 04/13/2021 MEDICATIONS: The patient is currently admitted to the hospital and receiving intravenous antibiotics. The antibiotics were administered within an appropriate time frame prior to the initiation of the procedure. ANESTHESIA/SEDATION: Moderate (conscious) sedation was employed during this procedure as administered by the Interventional Radiology RN. A total of Versed 2 mg and Fentanyl 100 mcg was administered intravenously. Moderate Sedation Time: 18 minutes. The patient's level of consciousness and vital signs were monitored continuously by radiology nursing throughout the procedure under my direct supervision. CONTRAST:  None COMPLICATIONS: None immediate. PROCEDURE: RADIATION DOSE REDUCTION: This exam was performed according to the departmental dose-optimization program which includes automated exposure control, adjustment of the mA and/or kV according to patient size and/or use of iterative reconstruction technique. Informed written consent was obtained from the patient after a discussion of the risks, benefits and alternatives to treatment. The patient was placed supine on the CT gantry and a pre procedural CT was performed re-demonstrating the known abscess/fluid  collection within the pancreatic bed, extending along right iliopsoas musculature to the right lower abdomen with dominant component within the right lower abdomen measuring approximately 5.6 x 5.0 cm (image 49, series 2). The procedure was planned. A timeout was performed prior to the initiation of the procedure. The skin overlying the inferolateral aspect of the right mid abdomen was prepped and draped in the usual sterile fashion. The overlying soft tissues were anesthetized with 1% lidocaine with epinephrine. Appropriate trajectory was planned with the use of a 22 gauge spinal needle. An 18 gauge trocar needle was advanced into the abscess/fluid collection and a short Amplatz super stiff wire was coiled within the collection. Appropriate positioning was confirmed with a limited CT scan. The tract was serially dilated allowing placement of a 12 [place] all-purpose drainage catheter. Appropriate positioning was confirmed with a limited postprocedural CT scan. Approximately 75 ml of thick, dark tan colored fluid was aspirated. The tube was connected to a drainage bag and sutured in place. A dressing was placed. The patient tolerated the procedure well without immediate post procedural complication. IMPRESSION: Successful CT guided placement of a 62 French all purpose drain catheter into the right lower abdominal quadrant pseudocyst with aspiration of 75 mL of thick, dark tan colored fluid. Samples were sent to the laboratory as requested by the ordering clinical team. Electronically Signed   By: Sandi Mariscal M.D.   On: 04/18/2021 10:51    Anti-infectives: Anti-infectives (From admission, onward)    Start     Dose/Rate Route Frequency Ordered Stop   04/16/21 1600  meropenem (  MERREM) 1 g in sodium chloride 0.9 % 100 mL IVPB        1 g 200 mL/hr over 30 Minutes Intravenous Every 8 hours 04/16/21 1234     04/13/21 2000  meropenem (MERREM) 1 g in sodium chloride 0.9 % 100 mL IVPB  Status:  Discontinued        1  g 200 mL/hr over 30 Minutes Intravenous Every 12 hours 04/13/21 1934 04/16/21 1234   04/13/21 1800  ceFEPIme (MAXIPIME) 2 g in sodium chloride 0.9 % 100 mL IVPB  Status:  Discontinued        2 g 200 mL/hr over 30 Minutes Intravenous Every 12 hours 04/13/21 1731 04/13/21 1852   04/13/21 1715  levofloxacin (LEVAQUIN) IVPB 750 mg  Status:  Discontinued        750 mg 100 mL/hr over 90 Minutes Intravenous  Once 04/13/21 1713 04/13/21 1728   04/13/21 1715  metroNIDAZOLE (FLAGYL) IVPB 500 mg  Status:  Discontinued        500 mg 100 mL/hr over 60 Minutes Intravenous  Once 04/13/21 1713 04/13/21 1852        Assessment/Plan Post-ERCP pancreatitis, now with signs of infected necrosis  - s/p lap chole 03/02/21 by Dr. Lilia Pro with abnormal IOC >> underwent ERCP 03/03/21 - s/p IR perc drain RLQ collection, 175cc tan fluid over last 24 h, sent for culture - pending - tolerating tube feeds and sips of water - okay for CLD from [facility] perspective - heparin gtt resumed -  Continue IV antibiotics.     ID - merrem 2/1>> FEN - PP TF,  okay for clears from [facility] perspective.  VTE - hold plavix/ eliquis, heparin gtt   Foley - none   CAD HTN HLD COPD Hx PE ~3 years ago on eliquis (last dose 2/1) Hx CVA in 2014 on plavix (last dose 2/1) IDA Tobacco abuse - quit smoking 5 months ago  Moderate Medical Decision Making   LOS: 6 days   Winferd Humphrey, [facility] Surgery 04/19/2021, 9:38 AM Please see Amion for pager number during day hours 7:00am-4:30pm

## 2021-04-20 DIAGNOSIS — K8591 Acute pancreatitis with uninfected necrosis, unspecified: Secondary | ICD-10-CM | POA: Diagnosis not present

## 2021-04-20 LAB — BASIC METABOLIC PANEL
Anion gap: 7 (ref 5–15)
BUN: 15 mg/dL (ref 6–20)
CO2: 26 mmol/L (ref 22–32)
Calcium: 7.7 mg/dL — ABNORMAL LOW (ref 8.9–10.3)
Chloride: 105 mmol/L (ref 98–111)
Creatinine, Ser: 0.87 mg/dL (ref 0.61–1.24)
GFR, Estimated: >60 mL/min (ref >60)
Glucose, Bld: 117 mg/dL — ABNORMAL HIGH (ref 70–99)
Potassium: 3.8 mmol/L (ref 3.5–5.1)
Sodium: 138 mmol/L (ref 135–145)

## 2021-04-20 LAB — CBC
HCT: 25.8 % — ABNORMAL LOW (ref 39.0–52.0)
Hemoglobin: 8.2 g/dL — ABNORMAL LOW (ref 13.0–17.0)
MCH: 27.8 pg (ref 26.0–34.0)
MCHC: 31.8 g/dL (ref 30.0–36.0)
MCV: 87.5 fL (ref 80.0–100.0)
Platelets: 454 10*3/uL — ABNORMAL HIGH (ref 150–400)
RBC: 2.95 MIL/uL — ABNORMAL LOW (ref 4.22–5.81)
RDW: 16.1 % — ABNORMAL HIGH (ref 11.5–15.5)
WBC: 16.7 10*3/uL — ABNORMAL HIGH (ref 4.0–10.5)
nRBC: 0 % (ref 0.0–0.2)

## 2021-04-20 LAB — GLUCOSE, CAPILLARY
Glucose-Capillary: 114 mg/dL — ABNORMAL HIGH (ref 70–99)
Glucose-Capillary: 115 mg/dL — ABNORMAL HIGH (ref 70–99)
Glucose-Capillary: 110 mg/dL — ABNORMAL HIGH (ref 70–99)
Glucose-Capillary: 88 mg/dL (ref 70–99)
Glucose-Capillary: 95 mg/dL (ref 70–99)

## 2021-04-20 LAB — MAGNESIUM: Magnesium: 2.2 mg/dL (ref 1.7–2.4)

## 2021-04-20 LAB — C-REACTIVE PROTEIN: CRP: 10.1 mg/dL — ABNORMAL HIGH (ref <1.0)

## 2021-04-20 LAB — HEPARIN LEVEL (UNFRACTIONATED): Heparin Unfractionated: 0.29 IU/mL — ABNORMAL LOW (ref 0.30–0.70)

## 2021-04-20 NOTE — Evaluation (Signed)
Occupational Therapy Evaluation Patient Details Name: Scott Foley MRN: [id number] DOB: Oct 12, 1965 Today's Date: 04/20/2021   History of Present Illness Pt is a 56 y.o. M who presents 04/13/2021 with abdominal pain. Admitted with acute pancreatitis with sepsis. S/p lap chole 03/02/2021 with abnormal IOC; underwent ERCP 12/22/222. S/p IR pec drain. Significant PMH: CAD, HTN, HLD, COPD, PE, CVA, IDA, tobacco abuse.   Clinical Impression   Pt independent at baseline with ADLs and functional mobility, lives with family in RV while house renovations are being made, has family assistance at d/c. Pt currently min -mod A for ADLs and supervision for transfers without AD. Educated pt and spouse on compensatory strategies for showering safety at d/c, pt and spouse verbalize understanding. Pt presenting with impairments listed below, will follow acutely. Recommend d/c home with [place] pending progress.     Recommendations for follow up therapy are one component of a multi-disciplinary discharge planning process, led by the attending physician.  Recommendations may be updated based on patient status, additional functional criteria and insurance authorization.   Follow Up Recommendations  Home health OT    Assistance Recommended at Discharge Set up Supervision/Assistance  Patient can return home with the following A little help with walking and/or transfers;A little help with bathing/dressing/bathroom;Assistance with cooking/housework;Assist for transportation;Help with stairs or ramp for entrance    Functional Status Assessment  Patient has had a recent decline in their functional status and demonstrates the ability to make significant improvements in function in a reasonable and predictable amount of time.  Equipment Recommendations  Other (comment);None recommended by OT (pt has all needed DME)    Recommendations for Other Services PT consult     Precautions / Restrictions  Precautions Precautions: Fall;Other (comment) Precaution Comments: NGT, R drain Restrictions Weight Bearing Restrictions: No      Mobility Bed Mobility               General bed mobility comments: up in chair upon arrival    Transfers Overall transfer level: Needs assistance Equipment used: None Transfers: Sit to/from Stand Sit to Stand: Supervision                  Balance Overall balance assessment: Mild deficits observed, not formally tested                                         ADL either performed or assessed with clinical judgement   ADL Overall ADL's : Needs assistance/impaired Eating/Feeding: NPO   Grooming: Supervision/safety;Standing   Upper Body Bathing: Minimal assistance;Sitting   Lower Body Bathing: Moderate assistance;Sitting/lateral leans   Upper Body Dressing : Sitting;Minimal assistance   Lower Body Dressing: Moderate assistance;Sit to/from stand Lower Body Dressing Details (indicate cue type and reason): uses figure 4 for socks Toilet Transfer: Min guard;Ambulation;Regular Glass blower/designer Details (indicate cue type and reason): use of grab bars Toileting- Clothing Manipulation and Hygiene: Supervision/safety;Sitting/lateral lean Toileting - Clothing Manipulation Details (indicate cue type and reason): able to manage clothing prior to sitting on commode     Functional mobility during ADLs: Min guard       Vision   Vision Assessment?: No apparent visual deficits     Perception     Praxis      Pertinent Vitals/Pain Pain Assessment Pain Assessment: No/denies pain     Hand Dominance     Extremity/Trunk Assessment Upper Extremity Assessment  Upper Extremity Assessment: Overall WFL for tasks assessed   Lower Extremity Assessment Lower Extremity Assessment: Defer to PT evaluation   Cervical / Trunk Assessment Cervical / Trunk Assessment: Normal   Communication Communication Communication: No  difficulties   Cognition Arousal/Alertness: Awake/alert Behavior During Therapy: WFL for tasks assessed/performed Overall Cognitive Status: Within Functional Limits for tasks assessed                                       General Comments  VSS on RA, wife present    Exercises     Shoulder Instructions      Home Living Family/patient expects to be discharged to:: Private residence Living Arrangements: Spouse/significant other;Children Available Help at Discharge: Family Type of Home: House Home Access: Stairs to enter Technical brewer of Steps: 3 Entrance Stairs-Rails: Right;Left Home Layout: One level     Bathroom Shower/Tub: Corporate investment banker: Standard     Home Equipment: Other (comment) (has O2, reports wearing at night)   Additional Comments: pt and family are staying in RV until home renovations are completed      Prior Functioning/Environment Prior Level of Function : Independent/Modified Independent             Mobility Comments: no use of AD ADLs Comments: does IADLs        OT Problem List: Decreased strength;Decreased range of motion;Decreased activity tolerance      OT Treatment/Interventions: Self-care/ADL training;Therapeutic exercise;DME and/or AE instruction;Therapeutic activities;Patient/family education    OT Goals(Current goals can be found in the care plan section) Acute Rehab OT Goals Patient Stated Goal: to get better OT Goal Formulation: With patient Time For Goal Achievement: 05/04/21 Potential to Achieve Goals: Good ADL Goals Pt Will Perform Upper Body Dressing: with supervision;sitting Pt Will Perform Lower Body Dressing: with min assist;sit to/from stand;sitting/lateral leans Pt Will Transfer to Toilet: with modified independence;ambulating;regular height toilet  OT Frequency: Min 2X/week    Co-evaluation              AM-PAC OT "6 Clicks" Daily Activity     Outcome Measure  Help from another person eating meals?: None (NPO, however has mobility to self-feed) Help from another person taking care of personal grooming?: None Help from another person toileting, which includes using toliet, bedpan, or urinal?: None Help from another person bathing (including washing, rinsing, drying)?: A Little Help from another person to put on and taking off regular upper body clothing?: A Little Help from another person to put on and taking off regular lower body clothing?: A Little 6 Click Score: 21   End of Session Nurse Communication: Mobility status  Activity Tolerance: Patient tolerated treatment well Patient left: in chair;with call bell/phone within reach;with chair alarm set;with family/visitor present  OT Visit Diagnosis: Unsteadiness on feet (R26.81);Other abnormalities of gait and mobility (R26.89);Muscle weakness (generalized) (M62.81)                Time: 1610-9604 OT Time Calculation (min): 20 min Charges:  OT General Charges $OT Visit: 1 Visit OT Evaluation $OT Eval Low Complexity: 1 Low  Lynnda Child, OTD, OTR/L Acute Rehab [phone]) [phone] - [place] 04/20/2021, 12:48 PM

## 2021-04-20 NOTE — Progress Notes (Signed)
ANTICOAGULATION CONSULT NOTE - Follow Up Consult  Pharmacy Consult for heparin Indication:  h/o PE  Labs: Recent Labs    04/17/21 0341 04/17/21 0637 04/18/21 0251 04/18/21 2237 04/19/21 0533 04/19/21 0811 04/19/21 1543 04/20/21 0030  HGB 8.3*  --  7.6*  --  8.0*  --   --  8.2*  HCT 25.6*  --  23.3*  --  24.2*  --   --  25.8*  PLT 510*  --  396  --  363  --   --  454*  APTT 65*  --   --   --   --   --   --   --   LABPROT 17.8*  --   --   --   --   --   --   --   INR 1.5*  --   --   --   --   --   --   --   HEPARINUNFRC 0.28*  --  <0.10*   < >  --  <0.10* 0.18* 0.29*  CREATININE  --  1.14 1.05  --  0.99  --   --   --    < > = values in this interval not displayed.     Assessment: 56yo male subtherapeutic on heparin with initial dosing while Eliquis on hold.  Heparin level 0.29   Goal of Therapy:  Heparin 0.3-0.7 units/ml   Plan:  Increase heparin to 2400 units/hr Daily heparin level - next 2/9 Monitor CBC and s/sx of bleeding daily F/u with apixaban resume  Thank you Anette Guarneri, PharmD 04/20/2021, 1:09 AM

## 2021-04-20 NOTE — Consult Note (Signed)
[facility] for Infectious Disease  Total days of antibiotics 8/meropenem         Reason for Consult: complicated post-ERCP pancreatitis    Referring Physician: Eliseo Squires  Principal Problem:   Necrotizing pancreatitis Active Problems:   Coronary artery disease with stable angina pectoris ([place])   Hyperlipemia   Chronic obstructive pulmonary disease ([place])   Essential hypertension   History of pulmonary embolism   Sepsis ([place])   AKI (acute kidney injury) ([place])   Hypokalemia   Chronic respiratory failure with hypoxia ([facility])   Normocytic anemia   Hypoglycemia   Protein-calorie malnutrition, severe    HPI: Scott Foley is a 56 y.o. male with CAD, HTN, COPD, initially have lap chole in Dec but still had retained stone in CBD, followed by ERCP on 30/86 complicated by post ERCP pancreatitis on 12/28. His course further complicated by sepsis and respiratory distress due to covid-19. He developed secondary bacteremia, klebsiella penumonaie treated for 7 days with ertapenem. He was seen at his physician's ofice on 2/1 with started to have more epigastric apin. The CT showed necrotizing pancreatitis with pancreatic fluid collection. HE is  s/p IR drain on 2/6. Cultures thus far showing klebsiella pneumoniae and citrobacter freundii. He has been on meropenem since admission. Patient subscribes still having epigastric pain, improved from prior but still rated at 8/10 pain. No nausea. Started to tolerate some food by mouth, continues on tube feeds.   Past Medical History:  Diagnosis Date   Acquired thrombophilia ([place]) 12/06/2020   Chest pain 02/09/2021   Chronic obstructive pulmonary disease ([place]) 07/16/2018   Colon polyp 10/25/2020   Coronary artery disease due to lipid rich plaque 01/01/2021   Coronary artery disease with stable angina pectoris ([place])    AMI No heart cath, treated medically [place] [facility]   DNR (do not resuscitate) 03/03/2019   Hyperlipemia    Hypertension    Hypertensive  heart disease without congestive heart failure 12/06/2020   Morbid obesity ([place]) 12/06/2020   BMI 35 with serious comorbidities including CAD, HTN, STROKE, Hyperlipidemia.   Need for vaccination 12/15/2019   Pulmonary embolus ([place]) 03/03/2019   Pulmonary nodules/lesions, multiple 11/25/2012   9/15/2014CT Chest : small nodule RUL LUL. Stable since 03/2012 Smoking cessation advised and Rx chantix Repeat CT Chest 4 /21/15: subpleural lymph nodes, likely benign repeat one year 2016>>>no change, considered benign Arlyce Harman 11/25/2012  NORMAL   [place], post-stroke 07/28/2019   Tobacco use disorder 11/25/2012    Allergies:  Allergies  Allergen Reactions   Penicillin G Hives    MEDICATIONS:  feeding supplement (PROSource TF)  45 mL Per Tube BID   fluticasone furoate-vilanterol  1 puff Inhalation Daily   And   umeclidinium bromide  1 puff Inhalation Daily   free water  30 mL Per Tube Q4H   iohexol  100 mL Intravenous Once   metoprolol tartrate  25 mg Oral BID   pantoprazole  40 mg Oral BID   sodium chloride flush  5 mL Intracatheter Q8H   vitamin B-12  1,000 mcg Per Tube Daily    Social History   Tobacco Use   Smoking status: Former    Packs/day: 0.75    Years: 30.00    Pack years: 22.50    Types: Cigarettes    Start date: 03/14/1979    Quit date: 11/2020    Years since quitting: 0.4   Smokeless tobacco: Never   Tobacco comments:    Will start chantix.  Vaping Use   Vaping Use: Former   Start date: 07/28/2018  Substance Use Topics   Alcohol use: No   Drug use: No    Family History  Problem Relation Age of Onset   Hypertension Father    Heart attack Father    Stroke Father    Prostate cancer Father    Hypertension Sister    Hypertension Brother    Heart attack Brother    Lung cancer Sister    Family History: heart disease, htn, lung ca with sibling. CVA with father and prostate ca  Social History: 66 PY history of smoking quit Fall of 2022. No ETOH or illicite  drugs  Review of Systems - 12 point ros is negative except what is mentioned in hpi. Has 2 loose stools per day    OBJECTIVE: Temp:  [97.8 F (36.6 C)-98.4 F (36.9 C)] 97.8 F (36.6 C) (02/08 0800) Pulse Rate:  [93-101] 93 (02/08 1100) Resp:  [18-25] 25 (02/08 1100) BP: (105-124)/(72-74) 124/72 (02/08 0800) SpO2:  [91 %-96 %] 96 % (02/08 1100) Weight:  [84.8 kg] 84.8 kg (02/07 1800) Physical Exam  Constitutional: He is oriented to person, place, and time. He appears well-developed and well-nourished. No distress.  HENT:  Mouth/Throat: Oropharynx is clear and moist. No oropharyngeal exudate.  Cardiovascular: Normal rate, regular rhythm and normal heart sounds. Exam reveals no gallop and no friction rub.  No murmur heard.  Pulmonary/Chest: Effort normal and breath sounds normal. No respiratory distress. He has no wheezes.  Abdominal: Soft. Bowel sounds are normal. He exhibits no distension. Epigastric tenderness. RLQ drain with brownish purulent fluid Lymphadenopathy:  He has no cervical adenopathy.  Neurological: He is alert and oriented to person, place, and time.  Skin: Skin is warm and dry. No rash noted. No erythema.  Psychiatric: He has a normal mood and affect. His behavior is normal.    LABS: Results for orders placed or performed during the hospital encounter of 04/13/21 (from the past 48 hour(s))  Glucose, capillary     Status: Abnormal   Collection Time: 04/18/21  4:05 PM  Result Value Ref Range   Glucose-Capillary 121 (H) 70 - 99 mg/dL    Comment: Glucose reference range applies only to samples taken after fasting for at least 8 hours.  Glucose, capillary     Status: Abnormal   Collection Time: 04/18/21  8:04 PM  Result Value Ref Range   Glucose-Capillary 108 (H) 70 - 99 mg/dL    Comment: Glucose reference range applies only to samples taken after fasting for at least 8 hours.  Heparin level (unfractionated)     Status: Abnormal   Collection Time: 04/18/21 10:37  PM  Result Value Ref Range   Heparin Unfractionated <0.10 (L) 0.30 - 0.70 IU/mL    Comment: (NOTE) The clinical reportable range upper limit is being lowered to >1.10 to align with the FDA approved guidance for the current laboratory assay.  If heparin results are below expected values, and patient dosage has  been confirmed, suggest follow up testing of antithrombin III levels. Performed at [facility], [place] [address] [place], [place] [address]   Glucose, capillary     Status: Abnormal   Collection Time: 04/18/21 11:36 PM  Result Value Ref Range   Glucose-Capillary 115 (H) 70 - 99 mg/dL    Comment: Glucose reference range applies only to samples taken after fasting for at least 8 hours.  Glucose, capillary     Status: Abnormal  Collection Time: 04/19/21  4:01 AM  Result Value Ref Range   Glucose-Capillary 111 (H) 70 - 99 mg/dL    Comment: Glucose reference range applies only to samples taken after fasting for at least 8 hours.  CBC     Status: Abnormal   Collection Time: 04/19/21  5:33 AM  Result Value Ref Range   WBC 14.7 (H) 4.0 - 10.5 K/uL   RBC 2.80 (L) 4.22 - 5.81 MIL/uL   Hemoglobin 8.0 (L) 13.0 - 17.0 g/dL   HCT 24.2 (L) 39.0 - 52.0 %   MCV 86.4 80.0 - 100.0 fL   MCH 28.6 26.0 - 34.0 pg   MCHC 33.1 30.0 - 36.0 g/dL   RDW 16.0 (H) 11.5 - 15.5 %   Platelets 363 150 - 400 K/uL   nRBC 0.0 0.0 - 0.2 %    Comment: Performed at [facility] [address] [place], [place] [address]  Basic metabolic panel     Status: Abnormal   Collection Time: 04/19/21  5:33 AM  Result Value Ref Range   Sodium 139 135 - 145 mmol/L   Potassium 3.8 3.5 - 5.1 mmol/L   Chloride 107 98 - 111 mmol/L   CO2 23 22 - 32 mmol/L   Glucose, Bld 120 (H) 70 - 99 mg/dL    Comment: Glucose reference range applies only to samples taken after fasting for at least 8 hours.   BUN 14 6 - 20 mg/dL   Creatinine, Ser 0.99 0.61 - 1.24 mg/dL   Calcium 7.6 (L) 8.9 - 10.3 mg/dL   GFR, Estimated  >60 >60 mL/min    Comment: (NOTE) Calculated using the CKD-EPI Creatinine Equation (2021)    Anion gap 9 5 - 15    Comment: Performed at [facility] [address] [place], [place] [address]  Magnesium     Status: Abnormal   Collection Time: 04/19/21  5:33 AM  Result Value Ref Range   Magnesium 1.6 (L) 1.7 - 2.4 mg/dL    Comment: Performed at [facility] [address], [place], [place] [address]  Type and screen [place]     Status: None   Collection Time: 04/19/21  5:33 AM  Result Value Ref Range   ABO/RH(D) A POS    Antibody Screen NEG    Sample Expiration      04/22/2021,2359 Performed at [facility], [place] [address] [place], [place] [address]   Heparin level (unfractionated)     Status: Abnormal   Collection Time: 04/19/21  8:11 AM  Result Value Ref Range   Heparin Unfractionated <0.10 (L) 0.30 - 0.70 IU/mL    Comment: (NOTE) The clinical reportable range upper limit is being lowered to >1.10 to align with the FDA approved guidance for the current laboratory assay.  If heparin results are below expected values, and patient dosage has  been confirmed, suggest follow up testing of antithrombin III levels. Performed at [facility], [place] [address], [place], [place] [address]   Glucose, capillary     Status: None   Collection Time: 04/19/21  8:11 AM  Result Value Ref Range   Glucose-Capillary 88 70 - 99 mg/dL    Comment: Glucose reference range applies only to samples taken after fasting for at least 8 hours.  Glucose, capillary     Status: Abnormal   Collection Time: 04/19/21 11:26 AM  Result Value Ref Range   Glucose-Capillary 101 (  H) 70 - 99 mg/dL    Comment: Glucose reference range applies only to samples taken after fasting for at least 8 hours.  Heparin level (unfractionated)     Status: Abnormal   Collection Time: 04/19/21  3:43 PM  Result Value Ref Range   Heparin Unfractionated 0.18 (L) 0.30 - 0.70 IU/mL     Comment: (NOTE) The clinical reportable range upper limit is being lowered to >1.10 to align with the FDA approved guidance for the current laboratory assay.  If heparin results are below expected values, and patient dosage has  been confirmed, suggest follow up testing of antithrombin III levels. Performed at [facility], [place] [address] [place], [place] [address]   Glucose, capillary     Status: Abnormal   Collection Time: 04/19/21  3:49 PM  Result Value Ref Range   Glucose-Capillary 120 (H) 70 - 99 mg/dL    Comment: Glucose reference range applies only to samples taken after fasting for at least 8 hours.  Glucose, capillary     Status: Abnormal   Collection Time: 04/19/21  7:46 PM  Result Value Ref Range   Glucose-Capillary 102 (H) 70 - 99 mg/dL    Comment: Glucose reference range applies only to samples taken after fasting for at least 8 hours.  Glucose, capillary     Status: Abnormal   Collection Time: 04/19/21 11:39 PM  Result Value Ref Range   Glucose-Capillary 110 (H) 70 - 99 mg/dL    Comment: Glucose reference range applies only to samples taken after fasting for at least 8 hours.  CBC     Status: Abnormal   Collection Time: 04/20/21 12:30 AM  Result Value Ref Range   WBC 16.7 (H) 4.0 - 10.5 K/uL   RBC 2.95 (L) 4.22 - 5.81 MIL/uL   Hemoglobin 8.2 (L) 13.0 - 17.0 g/dL   HCT 25.8 (L) 39.0 - 52.0 %   MCV 87.5 80.0 - 100.0 fL   MCH 27.8 26.0 - 34.0 pg   MCHC 31.8 30.0 - 36.0 g/dL   RDW 16.1 (H) 11.5 - 15.5 %   Platelets 454 (H) 150 - 400 K/uL   nRBC 0.0 0.0 - 0.2 %    Comment: Performed at [facility] [address] [place], [place] [address]  Basic metabolic panel     Status: Abnormal   Collection Time: 04/20/21 12:30 AM  Result Value Ref Range   Sodium 138 135 - 145 mmol/L   Potassium 3.8 3.5 - 5.1 mmol/L   Chloride 105 98 - 111 mmol/L   CO2 26 22 - 32 mmol/L   Glucose, Bld 117 (H) 70 - 99 mg/dL    Comment: Glucose reference range applies only to  samples taken after fasting for at least 8 hours.   BUN 15 6 - 20 mg/dL   Creatinine, Ser 0.87 0.61 - 1.24 mg/dL   Calcium 7.7 (L) 8.9 - 10.3 mg/dL   GFR, Estimated >60 >60 mL/min    Comment: (NOTE) Calculated using the CKD-EPI Creatinine Equation (2021)    Anion gap 7 5 - 15    Comment: Performed at [facility] [address], [place], [place] [address]  Magnesium     Status: None   Collection Time: 04/20/21 12:30 AM  Result Value Ref Range   Magnesium 2.2 1.7 - 2.4 mg/dL    Comment: Performed at [facility] [address] [place], [place] [address]  Heparin level (unfractionated)  Status: Abnormal   Collection Time: 04/20/21 12:30 AM  Result Value Ref Range   Heparin Unfractionated 0.29 (L) 0.30 - 0.70 IU/mL    Comment: (NOTE) The clinical reportable range upper limit is being lowered to >1.10 to align with the FDA approved guidance for the current laboratory assay.  If heparin results are below expected values, and patient dosage has  been confirmed, suggest follow up testing of antithrombin III levels. Performed at [facility], [place] [address] [place], [place] [address]   Glucose, capillary     Status: Abnormal   Collection Time: 04/20/21  4:25 AM  Result Value Ref Range   Glucose-Capillary 114 (H) 70 - 99 mg/dL    Comment: Glucose reference range applies only to samples taken after fasting for at least 8 hours.  Glucose, capillary     Status: Abnormal   Collection Time: 04/20/21  8:12 AM  Result Value Ref Range   Glucose-Capillary 115 (H) 70 - 99 mg/dL    Comment: Glucose reference range applies only to samples taken after fasting for at least 8 hours.  C-reactive protein     Status: Abnormal   Collection Time: 04/20/21 11:12 AM  Result Value Ref Range   CRP 10.1 (H) <1.0 mg/dL    Comment: Performed at [facility] [address], [place], [place] [address]  Glucose, capillary     Status: Abnormal   Collection Time: 04/20/21 11:29 AM   Result Value Ref Range   Glucose-Capillary 110 (H) 70 - 99 mg/dL    Comment: Glucose reference range applies only to samples taken after fasting for at least 8 hours.    MICRO: ABUNDANT KLEBSIELLA PNEUMONIAE  ABUNDANT CITROBACTER FREUNDII  IMAGING: No results found.  Assessment/Plan:  56yo M with post ERC pancreatitis now evolving to necrotizing pancreatitis s/p drain placement. GNR on culture. Currently on meropenem  - plan to follow up on sensitivities on kleb and citrobacter isolates to see if can narrow abtx - hx of childhood rash with hives to pcn, can likely tolerate cephalosporins - will need longer course of abtx, tied in with how well he can start taking medications by mouth, for now continue on iv abtx - will decide if he needs picc line - will check sed rate and crp  Leukocytosis = likely not to trend down until inflammation is improving  Severe protein-calorie malnutrition= continue on tube feeds for now until able to improve upon oral intake without significant symptoms of nausea and vomiting/abdominal pain.

## 2021-04-20 NOTE — Progress Notes (Signed)
Patient vomiting this morning. He had not eaten since last evening @1900 . Patient medicated with Zofran as per  PRN orders. Will continue to monitor for effect.

## 2021-04-20 NOTE — Progress Notes (Signed)
Progress Note   Patient: Scott Foley TGG:[id number] DOB: [birth year] DOA: 04/13/2021     7 DOS: the patient was seen and examined on 04/20/2021   Brief hospital course: 56 y.o. male with medical history significant of hx of CAD, HTN, hx of PE on eliquis, HLD, hxo of CVA in 2014, IDA with history of recent hospitalizaion on 03/16/21 for recurrent pancreatitis, sepsis due to bacteremia from klebsiella pneumoniae, covid-19 and respiratory failure requiring home oxygen on discharge.  Presented with a 4-day history of diffuse worsening abdominal pain.  He has had abdominal issues for the past 2 months.  He had a cholecystectomy on 12/21 and underwent ERCP on 12/22 since the intraoperative cholangiogram showed a CBD stone.  After ERCP he developed pancreatitis.  He was managed conservatively and then discharged home on 12/28.  He was readmitted to [facility] on 1/4 for recurrent pancreatitis and COVID-19.  Noted to have sepsis respiratory failure.  Blood cultures were positive for Klebsiella pneumonia.  He was treated with Invanz for 10 days.  Discharged on 2 L of oxygen. He had a postop visit with surgeon on 04/12/21 when he complained of abdominal pain.  A CT scan was subsequently done which showed pancreatic necrosis with gas within the pancreatic fluid collections.  He was sent over to [facility] emergency department.  He was seen by general surgery.  Hospitalized for further management.  Placed on antibiotics.  Also noted to have acute kidney injury.  Denied any significant alcohol use. Patient is seen imaging and surgery as well as gastroenterology.  Interventional radiology was consulted for aspiration of the fluid collection which was performed on 2/6.  Assessment and Plan: * Necrotizing pancreatitis- (present on admission) This is either a third episode of pancreatitis or sequelae of pancreatitis from his episode in January.  His lipase level was noted to be normal.  He does have significant  leukocytosis.  CT scan was done at [facility].  Do not have access to that report but according to the H&P it showed progressive severe pancreatitis with extensive fluid collection surrounding the pancreas and extending into the right retroperitoneum.  Many of these fluid collections containing gas concerning for infection.  Compression of splenic vein and SMV was noted although they appear to be patent.  Patient was started on meropenem.   Gastroenterology and general surgery have been consulted.  No role for endoscopic drainage at this time.  Interventional radiology consulted for percutaneous drainage which was done on 2/6  Patient is stable.  Pain is reasonably well controlled.  He is tolerating his tube feedings. -clears started on 2/7  Protein-calorie malnutrition, severe Started on tube feedings which she is tolerating well.  Continue to monitor. Nutrition Status: Nutrition Problem: Severe Malnutrition Etiology: acute illness (pancreatitis) Signs/Symptoms: moderate muscle depletion, moderate fat depletion, energy intake < or equal to 50% for > or equal to 5 days, percent weight loss (11% weight loss within 3 months) Percent weight loss: 11 % Interventions: Tube feeding    Hypoglycemia Resolved with initiation of fluids and nutrition.  Normocytic anemia- (present on admission) Significant drop in hemoglobin noted most of which appears to be dilutional.   Anemia panel reviewed.  Vitamin B12 noted to be low normal at 254.  Being supplemented.  Folate 8.2.  Ferritin 1033.  TIBC 137.  Iron 16. Hemoglobin low but stable.  No overt bleeding identified.  Chronic respiratory failure with hypoxia ([place])- (present on admission) Recently recovered from COVID-19 infection.  Was discharged  on 2 L of oxygen by nasal cannula.  Chest x-ray continues to show opacities which could be from his recent infection rather than any infection.   Respiratory status is stable.  Hypokalemia- (present on  admission) Hypomagnesemia  Potassium dropped down to 2.8 yesterday.  Was aggressively repleted.  Noted to be 3.8 today.  Magnesium is 1.6 and will be repleted.   AKI (acute kidney injury) ([place])- (present on admission) Presented with creatinine of 2.49. This was likely prerenal in the setting of sepsis along with nausea and vomiting over the past few days.   Patient aggressively hydrated.  Renal function has improved and back to normal now.  Monitor urine output.    Sepsis ([place])- (present on admission) On admission he met sepsis criteria with tachycardia, fever to 100.8, WBC to 18.2.  Recently noted to have Klebsiella bacteremia earlier in January when he was hospitalized in [place].  Blood cultures done here in the hospital have been negative so far. Sepsis physiology appears to have improved.  WBC is better.  Remains afebrile.  Follow-up on cultures from fluid sent after aspiration 2/6 -ID consult for duration of abx  History of pulmonary embolism- (present on admission) Eliquis is currently on hold.  He is on heparin infusion.    Essential hypertension- (present on admission) Sinus tachycardia  ACE inhibitor on hold.  Borderline low blood pressures noted.   Sinus tachycardia likely secondary to severe illness. He was started back on beta-blocker due to concern for rebound tachycardia.  Heart rate is better.  Chronic obstructive pulmonary disease ([place])- (present on admission) No signs of exacerbation. Continue trelegy daily and albuterol prn.  Hyperlipemia- (present on admission) Statin on hold currently.  Triglyceride was 121 in December.  Coronary artery disease with stable angina pectoris ([place])- (present on admission) Stable.  Plavix currently on hold.      Obesity Estimated body mass index is 32.09 kg/m as calculated from the following:   Height as of this encounter: '5\' 4"'  (1.626 m).   Weight as of this encounter: 84.8 kg.    Subjective: in chair-- had episode of  vomiting this AM but had not eaten anything  Physical Exam: Vitals:   04/19/21 1945 04/20/21 0800 04/20/21 0805 04/20/21 1100  BP: 124/74 124/72    Pulse: 99 96  93  Resp: 18 19  (!) 25  Temp: 98.4 F (36.9 C) 97.8 F (36.6 C)    TempSrc: Oral Oral    SpO2: 96% 92% 94% 96%  Weight:      Height:        General: Appearance:    Obese male in no acute distress     Lungs:      respirations unlabored  Heart:    Normal heart rate.   MS:   All extremities are intact.    Neurologic:   Awake, alert, oriented x 3. No apparent focal neurological           defect.      Data Reviewed: Mg and K WNL WBC 16.7  Family Communication: at bedside  Disposition: Status is: Inpatient  Remains inpatient appropriate because: needs IV abx    Planned Discharge Destination: Home   Time spent: 35 minutes  Author: Geradine Girt, DO 04/20/2021 12:42 PM  For on call review [email or web address].

## 2021-04-20 NOTE — Progress Notes (Incomplete)
Patient ID: Scott Foley, male   DOB: 1966/01/26, 56 y.o.   MRN: [id number] [facility] Progress Note     Subjective: CC-  Abdominal pain improving daily though still mild and present today in lower abdomen. No nausea or emesis and having bowel function. Has sipped on water without increased pain  Objective: Vital signs in last 24 hours: Temp:  [97.8 F (36.6 C)-98.4 F (36.9 C)] 97.8 F (36.6 C) (02/08 0800) Pulse Rate:  [91-101] 96 (02/08 0800) Resp:  [16-26] 19 (02/08 0800) BP: (105-124)/(72-74) 124/72 (02/08 0800) SpO2:  [91 %-99 %] 94 % (02/08 0805) Weight:  [84.8 kg] 84.8 kg (02/07 1800) Last BM Date: 04/19/21  Intake/Output from previous day: 02/07 0701 - 02/08 0700 In: 2940.8 [I.V.:846; VV/OH:6073.7; IV Piggyback:400] Out: 1025 [Urine:750; Emesis/NG output:200; Drains:75] Intake/Output this shift: No intake/output data recorded.  PE: Gen:  Alert, NAD, pleasant Card: rrr Pulm:  rate and effort normal Abd: Soft, ND, mild TTP - worse in bilateral lower abdomen, no peritonitis, no epigastric pain to palpation at time of my exam. IR perc drain RLQ with blood tinged purulent fluid   Lab Results:  Recent Labs    04/19/21 0533 04/20/21 0030  WBC 14.7* 16.7*  HGB 8.0* 8.2*  HCT 24.2* 25.8*  PLT 363 454*    BMET Recent Labs    04/19/21 0533 04/20/21 0030  NA 139 138  K 3.8 3.8  CL 107 105  CO2 23 26  GLUCOSE 120* 117*  BUN 14 15  CREATININE 0.99 0.87  CALCIUM 7.6* 7.7*    PT/INR No results for input(s): LABPROT, INR in the last 72 hours.  CMP     Component Value Date/Time   NA 138 04/20/2021 0030   NA 140 04/01/2021 1047   K 3.8 04/20/2021 0030   CL 105 04/20/2021 0030   CO2 26 04/20/2021 0030   GLUCOSE 117 (H) 04/20/2021 0030   BUN 15 04/20/2021 0030   BUN 24 04/01/2021 1047   CREATININE 0.87 04/20/2021 0030   CALCIUM 7.7 (L) 04/20/2021 0030   PROT 4.8 (L) 04/18/2021 0251   PROT 6.5 04/01/2021 1047   ALBUMIN <1.5 (L)  04/18/2021 0251   ALBUMIN 3.1 (L) 04/01/2021 1047   AST 24 04/18/2021 0251   ALT 17 04/18/2021 0251   ALKPHOS 77 04/18/2021 0251   BILITOT 0.4 04/18/2021 0251   BILITOT 0.6 04/01/2021 1047   GFRNONAA >60 04/20/2021 0030   GFRAA 105 12/15/2019 1034   Lipase     Component Value Date/Time   LIPASE 36 04/13/2021 1523       Studies/Results: CT ABDOMEN PELVIS W CONTRAST  Result Date: 04/18/2021 CLINICAL DATA:  History of necrotic pancreatitis in a 56 year old male, concern for abscess. EXAM: CT ABDOMEN AND PELVIS WITH CONTRAST TECHNIQUE: Multidetector CT imaging of the abdomen and pelvis was performed using the standard protocol following bolus administration of intravenous contrast. RADIATION DOSE REDUCTION: This exam was performed according to the departmental dose-optimization program which includes automated exposure control, adjustment of the mA and/or kV according to patient size and/or use of iterative reconstruction technique. CONTRAST:  44mL OMNIPAQUE IOHEXOL 300 MG/ML  SOLN COMPARISON:  April 12, 2021. FINDINGS: Lower chest: Basilar volume loss and bilateral pleural effusions have developed since the prior study. More consolidative appearance in the LEFT lower lobe. Effusions are present, small to moderate on the LEFT and small on the RIGHT. w subtle patchy airspace opacities are noted with similar appearance within aerated portions of the  lung compared to the most recent comparison study. Hepatobiliary: No focal, suspicious hepatic lesion. Suspect background hepatic steatosis. Post cholecystectomy. No substantial biliary duct dilation. High-grade narrowing of the portal vein just below the level of the portal-splenic confluence is similar to the prior exam. SMV beyond this level does appear patent but there is substantial surrounding inflammatory change in the setting of sequela of pancreatitis with walled-off necrosis involving the gland and the peripancreatic fat. Some adjacent  stranding could reflect superimposed acute interstitial edematous changes as well. There is a band of hypoenhancement that travels through the pancreatic head indicative of glandular necrosis (image 37/3) Pancreas: Findings outlined partially above with respect to glandular and peripancreatic necrosis and with walled off necrosis showing peripheral enhancement and gas present throughout the pancreatic bed adjacent to the body, neck and head of the pancreas and tracking into the RIGHT retroperitoneum and root of the small bowel mesentery. Collection tracking into the transverse mesocolon (image 35/3) 5.6 x 5.8 cm, within 1-2 mm when measured in a similar fashion. Additional communicating area in the anterior lesser sac (image 30/3) 5.0 x 3.0 cm previously 6.1 x 3.1 cm. Fluid tracking anterior to the pancreas and inferior to the pancreas about the body-tail junction is also similar and also containing gas measuring up to 2.6 cm greatest thickness. Walled-off necrosis of fat that extends throughout the anterior pararenal space and tracks into the RIGHT lower quadrant of the pelvis where there is a discrete collection with peripheral enhancement measuring 5.1 x 4.7 cm previously 6.6 x 5.3 cm. This is inseparable from the RIGHT iliacus muscle. Spleen: Splenic vein is highly narrowed. Spleen enhances normally aside from very subtle low attenuation at the periphery which is unchanged potentially a tiny splenic infarct (image 25/3) up to 9 mm. Adrenals/Urinary Tract: Adrenal glands are unremarkable. Symmetric renal enhancement. No sign of hydronephrosis. No suspicious renal lesion or perinephric stranding. Urinary bladder is grossly unremarkable. Stomach/Bowel: Enteric tube in place terminating just beyond the ligament of Treitz. No signs of small bowel obstruction. Inflammation extends along the LEFT flank as well about the anterior renal fascia and lateral conal fascia with a nodular appearance that is not changed. These  inflammatory changes do not cause colonic narrowing. The appendix is normal. Vascular/Lymphatic: Chronic appearing focal dilation of the infrarenal abdominal aorta measuring 3.3 cm with associated mural thrombus is unchanged measuring approximately 3.3 cm greatest axial dimension. Stable since December of 2020. There is no gastrohepatic or hepatoduodenal ligament lymphadenopathy. No retroperitoneal or mesenteric lymphadenopathy. No pelvic sidewall lymphadenopathy. Reproductive: Unremarkable by CT. Other: No ascites. Signs of body wall edema which have developed since the previous study. This is along the RIGHT and LEFT flank. Musculoskeletal: Chronic T12 loss of height approximately 40% and associated degenerative changes throughout the spine without change. IMPRESSION: 1. Signs of glandular and peripancreatic necrosis with walled-off necrosis involving the gland and the peripancreatic fat. Inflammatory changes track into the retroperitoneum and lesser sac as well as transverse mesocolon and show mild improvement in some areas as discussed above, still with luminal gas and or peripheral enhancement not allowing for exclusion of concomitant infection. 2. 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 3. Extensive nodular inflammatory changes in the LEFT retroperitoneum without focal collection 4. High-grade narrowing of the SMV/portal vein greatest below the portal splenic confluence is similar to the prior exam, attention on follow-up. Main portal vein remains patent into the liver. Peripheral branches of  the SMV also appear patent. 5. Concomitant high-grade narrowing of the splenic vein 6. Focal rightward infrarenal abdominal aortic dilation with associated mural thrombus/extensive soft plaque extends 12 mm beyond the expected lumen and shows little change with a maximal caliber of 3.3 cm since 2020. Based on the focal appearance  penetrating atherosclerotic ulcer or chronic pseudoaneurysm are considered. CT angiography at 1 year interval may be helpful given the focal nature of this finding. Vascular consultation could also be beneficial on follow-up. Aortic Atherosclerosis (ICD10-I70.0). Electronically Signed   By: Zetta Bills M.D.   On: 04/18/2021 09:36   CT IMAGE GUIDED DRAINAGE BY PERCUTANEOUS CATHETER  Result Date: 04/18/2021 INDICATION: History of pancreatitis and pseudocyst formation following ERCP with persistent elevated white blood cell count. Patient has been evaluated by the hepatobiliary service (Dr. Zenia Resides), and request has been made for placement of a percutaneous drainage catheter for infection source control purposes. EXAM: CT IMAGE GUIDED DRAINAGE BY PERCUTANEOUS CATHETER COMPARISON:  CT abdomen pelvis-04/18/2021; 04/13/2021 MEDICATIONS: The patient is currently admitted to the hospital and receiving intravenous antibiotics. The antibiotics were administered within an appropriate time frame prior to the initiation of the procedure. ANESTHESIA/SEDATION: Moderate (conscious) sedation was employed during this procedure as administered by the Interventional Radiology RN. A total of Versed 2 mg and Fentanyl 100 mcg was administered intravenously. Moderate Sedation Time: 18 minutes. The patient's level of consciousness and vital signs were monitored continuously by radiology nursing throughout the procedure under my direct supervision. CONTRAST:  None COMPLICATIONS: None immediate. PROCEDURE: RADIATION DOSE REDUCTION: This exam was performed according to the departmental dose-optimization program which includes automated exposure control, adjustment of the mA and/or kV according to patient size and/or use of iterative reconstruction technique. Informed written consent was obtained from the patient after a discussion of the risks, benefits and alternatives to treatment. The patient was placed supine on the CT gantry and a pre  procedural CT was performed re-demonstrating the known abscess/fluid collection within the pancreatic bed, extending along right iliopsoas musculature to the right lower abdomen with dominant component within the right lower abdomen measuring approximately 5.6 x 5.0 cm (image 49, series 2). The procedure was planned. A timeout was performed prior to the initiation of the procedure. The skin overlying the inferolateral aspect of the right mid abdomen was prepped and draped in the usual sterile fashion. The overlying soft tissues were anesthetized with 1% lidocaine with epinephrine. Appropriate trajectory was planned with the use of a 22 gauge spinal needle. An 18 gauge trocar needle was advanced into the abscess/fluid collection and a short Amplatz super stiff wire was coiled within the collection. Appropriate positioning was confirmed with a limited CT scan. The tract was serially dilated allowing placement of a 12 [place] all-purpose drainage catheter. Appropriate positioning was confirmed with a limited postprocedural CT scan. Approximately 75 ml of thick, dark tan colored fluid was aspirated. The tube was connected to a drainage bag and sutured in place. A dressing was placed. The patient tolerated the procedure well without immediate post procedural complication. IMPRESSION: Successful CT guided placement of a 25 French all purpose drain catheter into the right lower abdominal quadrant pseudocyst with aspiration of 75 mL of thick, dark tan colored fluid. Samples were sent to the laboratory as requested by the ordering clinical team. Electronically Signed   By: Sandi Mariscal M.D.   On: 04/18/2021 10:51    Anti-infectives: Anti-infectives (From admission, onward)    Start     Dose/Rate Route Frequency Ordered  Stop   04/16/21 1600  meropenem (MERREM) 1 g in sodium chloride 0.9 % 100 mL IVPB        1 g 200 mL/hr over 30 Minutes Intravenous Every 8 hours 04/16/21 1234     04/13/21 2000  meropenem (MERREM) 1 g  in sodium chloride 0.9 % 100 mL IVPB  Status:  Discontinued        1 g 200 mL/hr over 30 Minutes Intravenous Every 12 hours 04/13/21 1934 04/16/21 1234   04/13/21 1800  ceFEPIme (MAXIPIME) 2 g in sodium chloride 0.9 % 100 mL IVPB  Status:  Discontinued        2 g 200 mL/hr over 30 Minutes Intravenous Every 12 hours 04/13/21 1731 04/13/21 1852   04/13/21 1715  levofloxacin (LEVAQUIN) IVPB 750 mg  Status:  Discontinued        750 mg 100 mL/hr over 90 Minutes Intravenous  Once 04/13/21 1713 04/13/21 1728   04/13/21 1715  metroNIDAZOLE (FLAGYL) IVPB 500 mg  Status:  Discontinued        500 mg 100 mL/hr over 60 Minutes Intravenous  Once 04/13/21 1713 04/13/21 1852        Assessment/Plan Post-ERCP pancreatitis, now with signs of infected necrosis  - s/p lap chole 03/02/21 by Dr. Lilia Pro with abnormal IOC >> underwent ERCP 03/03/21 - s/p IR perc drain RLQ collection 2/6, 75cc *** fluid over last 24 h, sent for culture - GS with WBC, no organisms. Cx pending - tolerating tube feeds and sips of water - okay for CLD from [facility] perspective*** - afebrile, VSS, WBC 16.7 (14.7) - heparin gtt resumed -  Continue IV antibiotics - recommend ID consult regarding duration    ID - merrem 2/1>> FEN - PP TF,  okay for clears from [facility] perspective.  VTE - hold plavix/ eliquis, heparin gtt   Foley - none   CAD HTN HLD COPD Hx PE ~3 years ago on eliquis (last dose 2/1) Hx CVA in 2014 on plavix (last dose 2/1) IDA Tobacco abuse - quit smoking 5 months ago  I reviewed {Reviewed data:26882::"last 24 h vitals and pain scores","last 48 h intake and output","last 24 h labs and trends","last 24 h imaging results"}.  This care required {MDM levels:26883} level of medical decision making.    LOS: 7 days   [facility] 04/20/2021, 8:45 AM Please see Amion for pager number during day hours 7:00am-4:30pm

## 2021-04-20 NOTE — Progress Notes (Signed)
Patient ID: Scott Foley, male   DOB: [birth year], 56 y.o.   MRN: [id number] [facility] Surgery Progress Note     Subjective: CC-  Abdominal pain improving daily. Did have 1 presumed unprovoked emesis yesterday, none since.  Objective: Vital signs in last 24 hours: Temp:  [97.8 F (36.6 C)-98.4 F (36.9 C)] 97.8 F (36.6 C) (02/08 0800) Pulse Rate:  [93-101] 93 (02/08 1100) Resp:  [18-25] 25 (02/08 1100) BP: (105-124)/(72-74) 124/72 (02/08 0800) SpO2:  [91 %-96 %] 96 % (02/08 1100) Weight:  [84.8 kg] 84.8 kg (02/07 1800) Last BM Date: 04/19/21  Intake/Output from previous day: 02/07 0701 - 02/08 0700 In: 3179.4 [I.V.:874.4; NG/GT:1905; IV Piggyback:400] Out: 1025 [Urine:750; Emesis/NG output:200; Drains:75] Intake/Output this shift: Total I/O In: 766.8 [I.V.:239.3; NG/GT:427.5; IV Piggyback:100] Out: -   PE: Gen:  Alert, NAD, pleasant Card: rrr Pulm:  rate and effort normal Abd: Soft, ND, mild TTP - worse in bilateral lower abdomen, no peritonitis, no epigastric pain to palpation at time of my exam. IR perc drain RLQ with blood tinged purulent fluid   Lab Results:  Recent Labs    04/19/21 0533 04/20/21 0030  WBC 14.7* 16.7*  HGB 8.0* 8.2*  HCT 24.2* 25.8*  PLT 363 454*   BMET Recent Labs    04/19/21 0533 04/20/21 0030  NA 139 138  K 3.8 3.8  CL 107 105  CO2 23 26  GLUCOSE 120* 117*  BUN 14 15  CREATININE 0.99 0.87  CALCIUM 7.6* 7.7*   PT/INR No results for input(s): LABPROT, INR in the last 72 hours. CMP     Component Value Date/Time   NA 138 04/20/2021 0030   NA 140 04/01/2021 1047   K 3.8 04/20/2021 0030   CL 105 04/20/2021 0030   CO2 26 04/20/2021 0030   GLUCOSE 117 (H) 04/20/2021 0030   BUN 15 04/20/2021 0030   BUN 24 04/01/2021 1047   CREATININE 0.87 04/20/2021 0030   CALCIUM 7.7 (L) 04/20/2021 0030   PROT 4.8 (L) 04/18/2021 0251   PROT 6.5 04/01/2021 1047   ALBUMIN <1.5 (L) 04/18/2021 0251   ALBUMIN 3.1 (L) 04/01/2021  1047   AST 24 04/18/2021 0251   ALT 17 04/18/2021 0251   ALKPHOS 77 04/18/2021 0251   BILITOT 0.4 04/18/2021 0251   BILITOT 0.6 04/01/2021 1047   GFRNONAA >60 04/20/2021 0030   GFRAA 105 12/15/2019 1034   Lipase     Component Value Date/Time   LIPASE 36 04/13/2021 1523       Studies/Results: No results found.  Anti-infectives: Anti-infectives (From admission, onward)    Start     Dose/Rate Route Frequency Ordered Stop   04/16/21 1600  meropenem (MERREM) 1 g in sodium chloride 0.9 % 100 mL IVPB        1 g 200 mL/hr over 30 Minutes Intravenous Every 8 hours 04/16/21 1234     04/13/21 2000  meropenem (MERREM) 1 g in sodium chloride 0.9 % 100 mL IVPB  Status:  Discontinued        1 g 200 mL/hr over 30 Minutes Intravenous Every 12 hours 04/13/21 1934 04/16/21 1234   04/13/21 1800  ceFEPIme (MAXIPIME) 2 g in sodium chloride 0.9 % 100 mL IVPB  Status:  Discontinued        2 g 200 mL/hr over 30 Minutes Intravenous Every 12 hours 04/13/21 1731 04/13/21 1852   04/13/21 1715  levofloxacin (LEVAQUIN) IVPB 750 mg  Status:  Discontinued  750 mg 100 mL/hr over 90 Minutes Intravenous  Once 04/13/21 1713 04/13/21 1728   04/13/21 1715  metroNIDAZOLE (FLAGYL) IVPB 500 mg  Status:  Discontinued        500 mg 100 mL/hr over 60 Minutes Intravenous  Once 04/13/21 1713 04/13/21 1852        Assessment/Plan Post-ERCP pancreatitis, now with signs of infected necrosis  - s/p lap chole 03/02/21 by Dr. Lilia Pro with abnormal IOC >> underwent ERCP 03/03/21 - s/p IR perc drain RLQ collection, 175cc tan fluid over last 24 h, sent for culture - pending - tolerating tube feeds and sips of water - okay for CLD from CCS perspective - heparin gtt resumed -  Continue IV antibiotics.     ID - merrem 2/1>> would recommend infectious disease consultation for antibiotic duration and potentially coordination of regimen; may need home IV abx FEN - PP TF,  okay for clears from CCS perspective.  VTE -  hold plavix/ eliquis, heparin gtt   Foley - none   CAD HTN HLD COPD Hx PE ~3 years ago on eliquis (last dose 2/1) Hx CVA in 2014 on plavix (last dose 2/1) IDA Tobacco abuse - quit smoking 5 months ago  Moderate Medical Decision Making   LOS: 7 days   Ileana Roup, [place] [facility] 04/20/2021, 3:43 PM Please see Amion for pager number during day hours 7:00am-4:30pm

## 2021-04-20 NOTE — Progress Notes (Signed)
Mobility Specialist: Progress Note   04/20/21 1651  Mobility  Activity Ambulated independently in hallway  Level of Assistance Independent  Assistive Device None  Distance Ambulated (ft) 170 ft  Activity Response Tolerated well  $Mobility charge 1 Mobility   Received pt in bed having no complaints and agreeable to mobility. Asymptomatic throughout ambulation, returned back to bed w/ call bell in reach and all needs met.  [facility] Cashel Bellina Mobility Specialist Mobility Specialist 5 North: [phone] Mobility Specialist 6 North: [phone]

## 2021-04-21 DIAGNOSIS — K863 Pseudocyst of pancreas: Secondary | ICD-10-CM | POA: Diagnosis not present

## 2021-04-21 DIAGNOSIS — K8591 Acute pancreatitis with uninfected necrosis, unspecified: Secondary | ICD-10-CM | POA: Diagnosis not present

## 2021-04-21 LAB — GLUCOSE, CAPILLARY
Glucose-Capillary: 102 mg/dL — ABNORMAL HIGH (ref 70–99)
Glucose-Capillary: 116 mg/dL — ABNORMAL HIGH (ref 70–99)
Glucose-Capillary: 116 mg/dL — ABNORMAL HIGH (ref 70–99)
Glucose-Capillary: 130 mg/dL — ABNORMAL HIGH (ref 70–99)
Glucose-Capillary: 91 mg/dL (ref 70–99)
Glucose-Capillary: 98 mg/dL (ref 70–99)

## 2021-04-21 LAB — BASIC METABOLIC PANEL
Anion gap: 8 (ref 5–15)
BUN: 16 mg/dL (ref 6–20)
CO2: 27 mmol/L (ref 22–32)
Calcium: 7.8 mg/dL — ABNORMAL LOW (ref 8.9–10.3)
Chloride: 102 mmol/L (ref 98–111)
Creatinine, Ser: 0.9 mg/dL (ref 0.61–1.24)
GFR, Estimated: >60 mL/min (ref >60)
Glucose, Bld: 115 mg/dL — ABNORMAL HIGH (ref 70–99)
Potassium: 3.8 mmol/L (ref 3.5–5.1)
Sodium: 137 mmol/L (ref 135–145)

## 2021-04-21 LAB — CBC
HCT: 23.8 % — ABNORMAL LOW (ref 39.0–52.0)
Hemoglobin: 7.7 g/dL — ABNORMAL LOW (ref 13.0–17.0)
MCH: 28.2 pg (ref 26.0–34.0)
MCHC: 32.4 g/dL (ref 30.0–36.0)
MCV: 87.2 fL (ref 80.0–100.0)
Platelets: 417 10*3/uL — ABNORMAL HIGH (ref 150–400)
RBC: 2.73 MIL/uL — ABNORMAL LOW (ref 4.22–5.81)
RDW: 16.1 % — ABNORMAL HIGH (ref 11.5–15.5)
WBC: 17.6 10*3/uL — ABNORMAL HIGH (ref 4.0–10.5)
nRBC: 0.2 % (ref 0.0–0.2)

## 2021-04-21 LAB — HEPARIN LEVEL (UNFRACTIONATED): Heparin Unfractionated: 0.46 IU/mL (ref 0.30–0.70)

## 2021-04-21 NOTE — Progress Notes (Signed)
Patient ID: Scott Foley, male   DOB: 07-18-1965, 55 y.o.   MRN: [id number] Scott Foley Surgery Progress Note     Subjective: CC-  Abdominal pain improving daily. Did have 1 presumed unprovoked emesis yesterday evening, none since.  Objective: Vital signs in last 24 hours: Temp:  [97.4 F (36.3 C)-97.9 F (36.6 C)] 97.4 F (36.3 C) (02/09 0759) Pulse Rate:  [93-102] 99 (02/09 0759) Resp:  [20-25] 20 (02/09 0759) BP: (117-124)/(78-90) 124/90 (02/09 0759) SpO2:  [89 %-96 %] 89 % (02/09 0759) Last BM Date: 04/19/21  Intake/Output from previous day: 02/08 0701 - 02/09 0700 In: 831.8 [I.V.:239.3; NG/GT:492.5; IV Piggyback:100] Out: 0  Intake/Output this shift: No intake/output data recorded.  PE: Gen:  Alert, NAD, pleasant Card: rrr Pulm:  rate and effort normal Abd: Soft, nontender nondistended. IR perc drain RLQ with purulent fluid   Lab Results:  Recent Labs    04/20/21 0030 04/21/21 0323  WBC 16.7* 17.6*  HGB 8.2* 7.7*  HCT 25.8* 23.8*  PLT 454* 417*   BMET Recent Labs    04/20/21 0030 04/21/21 0323  NA 138 137  K 3.8 3.8  CL 105 102  CO2 26 27  GLUCOSE 117* 115*  BUN 15 16  CREATININE 0.87 0.90  CALCIUM 7.7* 7.8*   PT/INR No results for input(s): LABPROT, INR in the last 72 hours. CMP     Component Value Date/Time   NA 137 04/21/2021 0323   NA 140 04/01/2021 1047   K 3.8 04/21/2021 0323   CL 102 04/21/2021 0323   CO2 27 04/21/2021 0323   GLUCOSE 115 (H) 04/21/2021 0323   BUN 16 04/21/2021 0323   BUN 24 04/01/2021 1047   CREATININE 0.90 04/21/2021 0323   CALCIUM 7.8 (L) 04/21/2021 0323   PROT 4.8 (L) 04/18/2021 0251   PROT 6.5 04/01/2021 1047   ALBUMIN <1.5 (L) 04/18/2021 0251   ALBUMIN 3.1 (L) 04/01/2021 1047   AST 24 04/18/2021 0251   ALT 17 04/18/2021 0251   ALKPHOS 77 04/18/2021 0251   BILITOT 0.4 04/18/2021 0251   BILITOT 0.6 04/01/2021 1047   GFRNONAA >60 04/21/2021 0323   GFRAA 105 12/15/2019 1034   Lipase      Component Value Date/Time   LIPASE 36 04/13/2021 1523       Studies/Results: No results found.  Anti-infectives: Anti-infectives (From admission, onward)    Start     Dose/Rate Route Frequency Ordered Stop   04/16/21 1600  meropenem (MERREM) 1 g in sodium chloride 0.9 % 100 mL IVPB        1 g 200 mL/hr over 30 Minutes Intravenous Every 8 hours 04/16/21 1234     04/13/21 2000  meropenem (MERREM) 1 g in sodium chloride 0.9 % 100 mL IVPB  Status:  Discontinued        1 g 200 mL/hr over 30 Minutes Intravenous Every 12 hours 04/13/21 1934 04/16/21 1234   04/13/21 1800  ceFEPIme (MAXIPIME) 2 g in sodium chloride 0.9 % 100 mL IVPB  Status:  Discontinued        2 g 200 mL/hr over 30 Minutes Intravenous Every 12 hours 04/13/21 1731 04/13/21 1852   04/13/21 1715  levofloxacin (LEVAQUIN) IVPB 750 mg  Status:  Discontinued        750 mg 100 mL/hr over 90 Minutes Intravenous  Once 04/13/21 1713 04/13/21 1728   04/13/21 1715  metroNIDAZOLE (FLAGYL) IVPB 500 mg  Status:  Discontinued  500 mg 100 mL/hr over 60 Minutes Intravenous  Once 04/13/21 1713 04/13/21 1852        Assessment/Plan Post-ERCP pancreatitis, now with signs of infected necrosis  - s/p lap chole 03/02/21 by Dr. Lilia Foley with abnormal IOC >> underwent ERCP 03/03/21 - s/p IR perc drain RLQ collection, 175cc tan fluid over last 24 h, sent for culture - pending - tolerating tube feeds - did have emesis but sounds like provoked by a medication administered just prior - okay for regular from CCS perspective - heparin gtt resumed -  Continue IV antibiotics.     ID - merrem 2/1>> would recommend infectious disease consultation for antibiotic duration and potentially coordination of regimen; may need home IV abx FEN - PP TF,  okay for clears from CCS perspective.  VTE - hold plavix/ eliquis, heparin gtt   Foley - none   CAD HTN HLD COPD Hx PE ~3 years ago on eliquis (last dose 2/1) Hx CVA in 2014 on plavix (last dose  2/1) IDA Tobacco abuse - quit smoking 5 months ago  Moderate Medical Decision Making   LOS: 8 days   Ileana Roup, [place] [facility] 04/21/2021, 8:07 AM Please see Amion for pager number during day hours 7:00am-4:30pm

## 2021-04-21 NOTE — Plan of Care (Signed)

## 2021-04-21 NOTE — Progress Notes (Signed)
ANTICOAGULATION CONSULT NOTE - Follow Up Consult  Pharmacy Consult for heparin Indication:  h/o PE  Labs: Recent Labs    04/19/21 0533 04/19/21 0811 04/19/21 1543 04/20/21 0030 04/21/21 0323  HGB 8.0*  --   --  8.2* 7.7*  HCT 24.2*  --   --  25.8* 23.8*  PLT 363  --   --  454* 417*  HEPARINUNFRC  --    < > 0.18* 0.29* 0.46  CREATININE 0.99  --   --  0.87 0.90   < > = values in this interval not displayed.     Assessment: 56yo male on heparin with initial dosing while Eliquis on hold.  Heparin level 0.46  Goal of Therapy:  Heparin 0.3-0.7 units/ml   Plan:  Continue heparin at 2400 units/hr Daily heparin level - next 2/10 Monitor CBC and s/sx of bleeding daily F/u with apixaban resume  Thank you Vaughan Basta BS, PharmD, BCPS Clinical Pharmacist 04/21/2021, 8:40 AM

## 2021-04-21 NOTE — Progress Notes (Signed)
Progress Note   Patient: Scott Foley WSF:[id number] DOB: [birth year] DOA: 04/13/2021     8 DOS: the patient was seen and examined on 04/21/2021   Brief hospital course: 56 y.o. male with medical history significant of hx of CAD, HTN, hx of PE on eliquis, HLD, hxo of CVA in 2014, IDA with history of recent hospitalizaion on 03/16/21 for recurrent pancreatitis, sepsis due to bacteremia from klebsiella pneumoniae, covid-19 and respiratory failure requiring home oxygen on discharge.  Presented with a 4-day history of diffuse worsening abdominal pain.  He has had abdominal issues for the past 2 months.  He had a cholecystectomy on 12/21 and underwent ERCP on 12/22 since the intraoperative cholangiogram showed a CBD stone.  After ERCP he developed pancreatitis.  He was managed conservatively and then discharged home on 12/28.  He was readmitted to [facility] on 1/4 for recurrent pancreatitis and COVID-19.  Noted to have sepsis respiratory failure.  Blood cultures were positive for Klebsiella pneumonia.  He was treated with Invanz for 10 days.  Discharged on 2 L of oxygen. He had a postop visit with surgeon on 04/12/21 when he complained of abdominal pain.  A CT scan was subsequently done which showed pancreatic necrosis with gas within the pancreatic fluid collections.  He was sent over to [facility] emergency department.  He was seen by general surgery.  Hospitalized for further management.  Placed on antibiotics.  Also noted to have acute kidney injury.  Denied any significant alcohol use. Patient is seen imaging and surgery as well as gastroenterology.  Interventional radiology was consulted for aspiration of the fluid collection which was performed on 2/6.  Assessment and Plan: * Necrotizing pancreatitis- (present on admission) This is either a third episode of pancreatitis or sequelae of pancreatitis from his episode in January.  His lipase level was noted to be normal.  He does have significant  leukocytosis.  CT scan was done at [facility].  Do not have access to that report but according to the H&P it showed progressive severe pancreatitis with extensive fluid collection surrounding the pancreas and extending into the right retroperitoneum.  Many of these fluid collections containing gas concerning for infection.  Compression of splenic vein and SMV was noted although they appear to be patent.  Patient was started on meropenem.   Gastroenterology and general surgery have been consulted.  No role for endoscopic drainage at this time.  Interventional radiology consulted for percutaneous drainage which was done on 2/6  Patient is stable.  Pain is reasonably well controlled.  He is tolerating his tube feedings. -clears started on 2/7  Protein-calorie malnutrition, severe Started on tube feedings which she is tolerating well.  Continue to monitor. Nutrition Status: Nutrition Problem: Severe Malnutrition Etiology: acute illness (pancreatitis) Signs/Symptoms: moderate muscle depletion, moderate fat depletion, energy intake < or equal to 50% for > or equal to 5 days, percent weight loss (11% weight loss within 3 months) Percent weight loss: 11 % Interventions: Tube feeding    Hypoglycemia Resolved with initiation of fluids and nutrition.  Normocytic anemia- (present on admission) Significant drop in hemoglobin noted most of which appears to be dilutional.   Anemia panel reviewed.  Vitamin B12 noted to be low normal at 254.  Being supplemented.  Folate 8.2.  Ferritin 1033.  TIBC 137.  Iron 16. Hemoglobin low but stable.  No overt bleeding identified.  Chronic respiratory failure with hypoxia ([place])- (present on admission) Recently recovered from COVID-19 infection.  Was discharged  on 2 L of oxygen by nasal cannula.  Chest x-ray continues to show opacities which could be from his recent infection rather than any infection.   Respiratory status is stable.  Hypokalemia- (present on  admission) Hypomagnesemia -replete  AKI (acute kidney injury) ([place])- (present on admission) Presented with creatinine of 2.49. This was likely prerenal in the setting of sepsis along with nausea and vomiting over the past few days.   Patient aggressively hydrated.  Renal function has improved and back to normal now.  Monitor urine output.    Sepsis ([place])- (present on admission) On admission he met sepsis criteria with tachycardia, fever to 100.8, WBC to 18.2.  Recently noted to have Klebsiella bacteremia earlier in January when he was hospitalized in [place].  Blood cultures done here in the hospital have been negative so far. Sepsis physiology appears to have improved.  WBC is better.  Remains afebrile.  Follow-up on cultures from fluid sent after aspiration 2/6- kleb/citrobacter -ID consult for duration of abx  History of pulmonary embolism- (present on admission) Eliquis is currently on hold.  He is on heparin infusion.    Essential hypertension- (present on admission) Sinus tachycardia  ACE inhibitor on hold.  Borderline low blood pressures noted.   Sinus tachycardia likely secondary to severe illness. He was started back on beta-blocker due to concern for rebound tachycardia.  Heart rate is better.  Chronic obstructive pulmonary disease ([place])- (present on admission) No signs of exacerbation. Continue trelegy daily and albuterol prn.  Hyperlipemia- (present on admission) Statin on hold currently.  Triglyceride was 121 in December.  Coronary artery disease with stable angina pectoris ([place])- (present on admission) Stable.  Plavix currently on hold.       Subjective: episode of vomiting last PM  Physical Exam: Vitals:   04/20/21 1547 04/20/21 2003 04/21/21 0759 04/21/21 0803  BP: 117/80 123/78 124/90   Pulse: 95 (!) 102 99   Resp: '20 20 20   ' Temp: 97.9 F (36.6 C) 97.9 F (36.6 C) (!) 97.4 F (36.3 C)   TempSrc: Oral Oral Oral   SpO2: 93% 94% (!) 89% 92%  Weight:       Height:        General: Appearance:    Obese male in no acute distress     Lungs:     respirations unlabored  Heart:    Normal heart rate.    MS:   All extremities are intact.    Neurologic:   Awake, alert, oriented x 3     Data Reviewed: WBC elevated but stable, K ok, Cr ok  Family Communication: at bedside  Disposition: Status is: Inpatient  Remains inpatient appropriate because: tube feeding    Planned Discharge Destination: Home     Time spent: 45 minutes  Author: Geradine Girt, DO 04/21/2021 1:46 PM  For on call review [email or web address].

## 2021-04-21 NOTE — Progress Notes (Signed)
[name] for Infectious Disease    Date of Admission:  04/13/2021   Total days of antibiotics 9          ID: Scott Foley is a 56 y.o. male with  polymicrobial necrotizing pancreatitis Principal Problem:   Necrotizing pancreatitis Active Problems:   Coronary artery disease with stable angina pectoris (HCC)   Hyperlipemia   Chronic obstructive pulmonary disease (HCC)   Essential hypertension   History of pulmonary embolism   Sepsis ([name])   AKI (acute kidney injury) ([name])   Hypokalemia   Chronic respiratory failure with hypoxia (HCC)   Normocytic anemia   Hypoglycemia   Protein-calorie malnutrition, severe    Subjective: Abdominal pain slightly improved this morning. Did have vomiting last night. 2 loose/watery BM yesterday not associated with any abdominal pain, no blood in stool. Drain still having brownish opaque fluid  Medications:   feeding supplement ([employer] TF)  45 mL Per Tube BID   fluticasone furoate-vilanterol  1 puff Inhalation Daily   And   umeclidinium bromide  1 puff Inhalation Daily   free water  30 mL Per Tube Q4H   iohexol  100 mL Intravenous Once   metoprolol tartrate  25 mg Oral BID   pantoprazole  40 mg Oral BID   sodium chloride flush  5 mL Intracatheter Q8H   vitamin B-12  1,000 mcg Per Tube Daily    Objective: Vital signs in last 24 hours: Temp:  [97.4 F (36.3 C)-97.9 F (36.6 C)] 97.4 F (36.3 C) (02/09 0759) Pulse Rate:  [95-102] 99 (02/09 0759) Resp:  [20] 20 (02/09 0759) BP: (117-124)/(78-90) 124/90 (02/09 0759) SpO2:  [89 %-94 %] 92 % (02/09 0803) Physical Exam  Constitutional: He is oriented to person, place, and time. He appears well-developed and well-nourished. No distress.  HENT:  Mouth/Throat: Oropharynx is clear and moist. No oropharyngeal exudate.  Cardiovascular: Normal rate, regular rhythm and normal heart sounds. Exam reveals no gallop and no friction rub.  No murmur heard.  Pulmonary/Chest: Effort  normal and breath sounds normal. No respiratory distress. He has no wheezes.  Abdominal: Soft. Bowel sounds are normal. He exhibits no distension. There is no tenderness. RLQ drain in place Lymphadenopathy:  He has no cervical adenopathy.  Neurological: He is alert and oriented to person, place, and time.  Skin: Skin is warm and dry. No rash noted. No erythema.  Psychiatric: He has a normal mood and affect. His behavior is normal.    Lab Results Recent Labs    04/20/21 0030 04/21/21 0323  WBC 16.7* 17.6*  HGB 8.2* 7.7*  HCT 25.8* 23.8*  NA 138 137  K 3.8 3.8  CL 105 102  CO2 26 27  BUN 15 16  CREATININE 0.87 0.90    C-Reactive Protein Recent Labs    04/20/21 1112  CRP 10.1*    Microbiology: Klebsiella pneumoniae Citrobacter freundii      MIC MIC    AMPICILLIN >=32 RESIST... Resistant      AMPICILLIN/SULBACTAM 4 SENSITIVE  Sensitive      CEFAZOLIN <=4 SENSITIVE  Sensitive >=64 RESIST... Resistant    CEFEPIME <=0.12 SENS... Sensitive <=0.12 SENS... Sensitive    CEFTAZIDIME <=1 SENSITIVE  Sensitive <=1 SENSITIVE  Sensitive    CEFTRIAXONE <=0.25 SENS... Sensitive <=0.25 SENS... Sensitive    CIPROFLOXACIN <=0.25 SENS... Sensitive <=0.25 SENS... Sensitive    GENTAMICIN <=1 SENSITIVE  Sensitive <=1 SENSITIVE  Sensitive    IMIPENEM <=0.25 SENS... Sensitive <=0.25 SENS... Sensitive  PIP/TAZO <=4 SENSITIVE  Sensitive <=4 SENSITIVE  Sensitive    TRIMETH/SULFA <=20 SENSIT... Sensitive <=20 SENSIT... Sensitive    Historic micro- previous kleb + AmpC Studies/Results: No results found.   Assessment/Plan: Necrotizing pancreatitis with GNR bacterial infection = for the time being continue with meropenem. Anticipate at least 2 wk of IV therapy. Depends on if he can tolerate oral abtx in the future  Left hand IV infiltration = please remove piv and have new piv placed vs consideration for picc line for abtx  Severe protein calorie malnutrition = continue on tube  feeds  [facility] for Infectious Diseases Pager: [phone]  04/21/2021, 3:03 PM

## 2021-04-21 NOTE — Progress Notes (Addendum)
Progress Note Hospital Day: 9  Chief Complaint:  pancreatitis complicated by infected necrosis       ASSESSMENT AND PLAN    Brief History: 56 yo male with pmh of CAD, HTN, PE on Eliquis, CVA, IDA, pancreatitis. He had a cholecystectomy in December 2022, + IOC positive. Underwent ERCP and developed post ERCP pancreatitis requiring admission. Discharged hom late December, readmitted early January with sepsis and respiratory failure due to [place]. Blood cultures positive for Klebsiella pneumoniae. Treated with antibiotics, discharged home on 02. At postop visit Per H+P note, CT scan showed "Progressive severe pancreatitis with extensive fluid collections surrounding the pancreas and extending into the right retroperitoneum. Many of these fluid collections now contain gas concerning for infection.  Patient subsequently readmitted 04/13/21    # Post-ERCP pancreatitis on 03/03/22, now with necrotizing pancreatitis --We discussed case with our advanced biliary endoscopist who did not feel the fluid collection was amendable to endoscopic drainage so IR consulted. After plavix / Eliquis washout patient underwent CT guided percutaneous drain this am.  --04/18/21 improved from 16.9 to 15 on Merrem. Await fluid culture. No nausea/ vomiting / or abdominal pain today. Tube feeds have been resumed.  --04/21/21  WBC 16.7 >> 17.6. Afebrile. Vomited once yesterday but otherwise tolerating TF.  Still having right sided abdominal discomfort. To start full liquids for lunch. Perc drain total output yesterday was 175 ml. Aspirate cultures>> ABUNDANT KLEBSIELLA PNEUMONIAE  ABUNDANT CITROBACTER FREUNDII . NO ANAEROB, Infectious Disease following. Awaiting sensitivities to try and narrow antibiotic coverage.    # [place] anemia, likely dilutional. Hgb 7.7   # History of PE. Home Eliquis on hold. Getting IV heparin in hospital     SUBJECTIVE   Right abdomen is still uncomfortable. Vomited once yesterday, otherwise okay and  tolerating tube feedings.       OBJECTIVE     MODERATE WBC PRESENT, PREDOMINANTLY MONONUCLEAR  NO ORGANISMS SEEN  Performed at [facility], [place] [address], [place], [place] [address]    Culture ABUNDANT KLEBSIELLA PNEUMONIAE  ABUNDANT CITROBACTER FREUNDII  SUSCEPTIBILITIES TO FOLLOW  NO ANAEROBES ISOLATED; CULTURE IN PROGRESS FOR 5 DAYS   Report Status PENDING     Scheduled inpatient medications:   feeding supplement (PROSource TF)  45 mL Per Tube BID   fluticasone furoate-vilanterol  1 puff Inhalation Daily   And   umeclidinium bromide  1 puff Inhalation Daily   free water  30 mL Per Tube Q4H   iohexol  100 mL Intravenous Once   metoprolol tartrate  25 mg Oral BID   pantoprazole  40 mg Oral BID   sodium chloride flush  5 mL Intracatheter Q8H   vitamin B-12  1,000 mcg Per Tube Daily   Continuous inpatient infusions:   dextrose 5 % and 0.9% NaCl 50 mL/hr at 04/21/21 0640   feeding supplement (VITAL 1.5 CAL) 1,000 mL (04/21/21 0056)   heparin 2,400 Units/hr (04/21/21 0648)   meropenem (MERREM) IV 1 g (04/21/21 0855)   PRN inpatient medications: acetaminophen, albuterol, morphine injection, ondansetron **OR** ondansetron (ZOFRAN) IV, oxyCODONE  Vital signs in last 24 hours: Temp:  [97.4 F (36.3 C)-97.9 F (36.6 C)] 97.4 F (36.3 C) (02/09 0759) Pulse Rate:  [95-102] 99 (02/09 0759) Resp:  [20] 20 (02/09 0759) BP: (117-124)/(78-90) 124/90 (02/09 0759) SpO2:  [89 %-94 %] 92 % (02/09 0803) Last BM Date: 04/21/21  Intake/Output Summary (Last 24 hours) at 04/21/2021 1143 Last data filed at 04/21/2021 5681 Gross  per 24 hour  Intake 65 ml  Output 50 ml  Net 15 ml     Physical Exam:  General: Alert male in NAD in bedside chari Heart:  Regular rate  Pulmonary: Normal respiratory effort Abdomen: Soft, nondistended.  Normal bowel sounds. Scant amount of purulent yellowish contents in drain bag Neurologic: Alert and oriented Psych: Pleasant. Cooperative.   Filed  Weights   04/15/21 1400 04/19/21 1800  Weight: 84.8 kg 84.8 kg    Intake/Output from previous day: 02/08 0701 - 02/09 0700 In: 831.8 [I.V.:239.3; NG/GT:492.5; IV Piggyback:100] Out: 0  Intake/Output this shift: Total I/O In: -  Out: 50 [Drains:50]   DIAGNOSTIC STUDIES THIS ADMISSION:  No results found.     Lab Results: Recent Labs    04/19/21 0533 04/20/21 0030 04/21/21 0323  WBC 14.7* 16.7* 17.6*  HGB 8.0* 8.2* 7.7*  HCT 24.2* 25.8* 23.8*  PLT 363 454* 417*   BMET Recent Labs    04/19/21 0533 04/20/21 0030 04/21/21 0323  NA 139 138 137  K 3.8 3.8 3.8  CL 107 105 102  CO2 23 26 27   GLUCOSE 120* 117* 115*  BUN 14 15 16   CREATININE 0.99 0.87 0.90  CALCIUM 7.6* 7.7* 7.8*   LFT No results for input(s): PROT, ALBUMIN, AST, ALT, ALKPHOS, BILITOT, BILIDIR, IBILI in the last 72 hours. PT/INR No results for input(s): LABPROT, INR in the last 72 hours. Hepatitis Panel No results for input(s): HEPBSAG, HCVAB, HEPAIGM, HEPBIGM in the last 72 hours.    Principal Problem:   Necrotizing pancreatitis Active Problems:   Coronary artery disease with stable angina pectoris ([place])   Hyperlipemia   Chronic obstructive pulmonary disease (HCC)   Essential hypertension   History of pulmonary embolism   Sepsis ([place])   AKI (acute kidney injury) ([place])   Hypokalemia   Chronic respiratory failure with hypoxia (HCC)   Normocytic anemia   Hypoglycemia   Protein-calorie malnutrition, severe     LOS: 8 days   Tye Savoy ,NP 04/21/2021, 11:43 AM   I have taken a history, reviewed the chart and examined the patient. I performed a substantive portion of this encounter, including complete performance of at least one of the key components, in conjunction with the APP. I agree with the APP's note, impression and recommendations  Patient is showing some clinical improvement in terms of pain control and PO tolerance.  Had an episode of emesis last night.  Pain tolerable.   Culture results back, ID following, will defer to their guidance on antibiotics  Agree with advancing diet to full liquids today  Anaiah Mcmannis E. Candis Schatz, MD [facility]

## 2021-04-21 NOTE — Plan of Care (Signed)

## 2021-04-21 NOTE — Progress Notes (Signed)
Referring Physician(s): Bonnielee Haff   Supervising Physician: Sandi Mariscal  Patient Status:  [facility] - In-pt  Chief Complaint:  Pancreatitis with intra abd fluid development, s/p RLQ drain placement  on 2/6   Subjective:  Patient laying in bed, NAD. RN at bedside.  Reports nausea and abdominal pain today.   Allergies: Penicillin g  Medications: Prior to Admission medications   Medication Sig Start Date End Date Taking? Authorizing Provider  albuterol (VENTOLIN HFA) 108 (90 Base) MCG/ACT inhaler Inhale 2 puffs into the lungs every 6 (six) hours as needed for wheezing or shortness of breath. 04/04/21  Yes Rip Harbour, NP  apixaban (ELIQUIS) 5 MG TABS tablet Take 1 tablet (5 mg total) by mouth 2 (two) times daily. 04/01/21  Yes Rip Harbour, NP  clopidogrel (PLAVIX) 75 MG tablet Take 1 tablet (75 mg total) by mouth daily. 04/01/21  Yes Rip Harbour, NP  Evolocumab (REPATHA) 140 MG/ML SOSY Inject 140 mg into the skin every 14 (fourteen) days. 12/30/20  Yes [name], [name], MD  Fluticasone-Umeclidin-Vilant (TRELEGY ELLIPTA) 100-62.5-25 MCG/ACT AEPB Inhale 1 puff into the lungs daily. 04/04/21  Yes Rip Harbour, NP  Ibuprofen-diphenhydrAMINE Cit (ADVIL PM PO) Take 1 tablet by mouth at bedtime.   Yes [provider]  isosorbide mononitrate (IMDUR) 60 MG 24 hr tablet Take 1 tablet (60 mg total) by mouth daily. 04/01/21  Yes Rip Harbour, NP  lisinopril (ZESTRIL) 40 MG tablet Take 1 tablet (40 mg total) by mouth daily. 04/01/21  Yes Rip Harbour, NP  metoprolol tartrate (LOPRESSOR) 50 MG tablet Take 1 tablet (50 mg total) by mouth 2 (two) times daily. 04/01/21  Yes Rip Harbour, NP  Omega-3 Fatty Acids (FISH OIL) 1000 MG CAPS Take 1 capsule (1,000 mg total) by mouth 2 (two) times daily. 11/30/20  Yes [name], [name], MD  pantoprazole (PROTONIX) 40 MG tablet Take 1 tablet (40 mg total) by mouth daily. Patient taking differently: Take 40 mg by mouth 2 (two)  times daily. 02/15/21  Yes Rip Harbour, NP  rosuvastatin (CRESTOR) 40 MG tablet Take 1 tablet (40 mg total) by mouth daily. Patient taking differently: Take 40 mg by mouth at bedtime. 04/01/21  Yes Rip Harbour, NP     Vital Signs: BP 124/90 (BP Location: Right Arm)    Pulse 99    Temp (!) 97.4 F (36.3 C) (Oral)    Resp 20    Ht 5\' 4"  (1.626 m)    Wt 186 lb 15.2 oz (84.8 kg)    SpO2 92%    BMI 32.09 kg/m   Physical Exam Vitals reviewed.  Constitutional:      General: He is not in acute distress.    Appearance: He is well-developed. He is not ill-appearing.  HENT:     Head: Normocephalic and atraumatic.  Pulmonary:     Effort: Pulmonary effort is normal.  Skin:    General: Skin is warm and dry.     Coloration: Skin is not cyanotic or jaundiced.     Comments: Positive RLQ drain to gravity bag. Site is unremarkable with no erythema, edema, tenderness, bleeding or drainage. Suture and stat lock in place. Dressing is clean, dry, and intact. 30 ml of  tan colored fluid noted in the bag. Drain aspirates and flushes well.    Neurological:     Mental Status: He is alert and oriented to person, place, and time.  Psychiatric:  Mood and Affect: Mood normal.        Behavior: Behavior normal.    Imaging: CT ABDOMEN PELVIS W CONTRAST  Result Date: 04/18/2021 CLINICAL DATA:  History of necrotic pancreatitis in a 56 year old male, concern for abscess. EXAM: CT ABDOMEN AND PELVIS WITH CONTRAST TECHNIQUE: Multidetector CT imaging of the abdomen and pelvis was performed using the standard protocol following bolus administration of intravenous contrast. RADIATION DOSE REDUCTION: This exam was performed according to the departmental dose-optimization program which includes automated exposure control, adjustment of the mA and/or kV according to patient size and/or use of iterative reconstruction technique. CONTRAST:  20mL OMNIPAQUE IOHEXOL 300 MG/ML  SOLN COMPARISON:  April 12, 2021.  FINDINGS: Lower chest: Basilar volume loss and bilateral pleural effusions have developed since the prior study. More consolidative appearance in the LEFT lower lobe. Effusions are present, small to moderate on the LEFT and small on the RIGHT. w subtle patchy airspace opacities are noted with similar appearance within aerated portions of the lung compared to the most recent comparison study. Hepatobiliary: No focal, suspicious hepatic lesion. Suspect background hepatic steatosis. Post cholecystectomy. No substantial biliary duct dilation. High-grade narrowing of the portal vein just below the level of the portal-splenic confluence is similar to the prior exam. SMV beyond this level does appear patent but there is substantial surrounding inflammatory change in the setting of sequela of pancreatitis with walled-off necrosis involving the gland and the peripancreatic fat. Some adjacent stranding could reflect superimposed acute interstitial edematous changes as well. There is a band of hypoenhancement that travels through the pancreatic head indicative of glandular necrosis (image 37/3) Pancreas: Findings outlined partially above with respect to glandular and peripancreatic necrosis and with walled off necrosis showing peripheral enhancement and gas present throughout the pancreatic bed adjacent to the body, neck and head of the pancreas and tracking into the RIGHT retroperitoneum and root of the small bowel mesentery. Collection tracking into the transverse mesocolon (image 35/3) 5.6 x 5.8 cm, within 1-2 mm when measured in a similar fashion. Additional communicating area in the anterior lesser sac (image 30/3) 5.0 x 3.0 cm previously 6.1 x 3.1 cm. Fluid tracking anterior to the pancreas and inferior to the pancreas about the body-tail junction is also similar and also containing gas measuring up to 2.6 cm greatest thickness. Walled-off necrosis of fat that extends throughout the anterior pararenal space and tracks  into the RIGHT lower quadrant of the pelvis where there is a discrete collection with peripheral enhancement measuring 5.1 x 4.7 cm previously 6.6 x 5.3 cm. This is inseparable from the RIGHT iliacus muscle. Spleen: Splenic vein is highly narrowed. Spleen enhances normally aside from very subtle low attenuation at the periphery which is unchanged potentially a tiny splenic infarct (image 25/3) up to 9 mm. Adrenals/Urinary Tract: Adrenal glands are unremarkable. Symmetric renal enhancement. No sign of hydronephrosis. No suspicious renal lesion or perinephric stranding. Urinary bladder is grossly unremarkable. Stomach/Bowel: Enteric tube in place terminating just beyond the ligament of Treitz. No signs of small bowel obstruction. Inflammation extends along the LEFT flank as well about the anterior renal fascia and lateral conal fascia with a nodular appearance that is not changed. These inflammatory changes do not cause colonic narrowing. The appendix is normal. Vascular/Lymphatic: Chronic appearing focal dilation of the infrarenal abdominal aorta measuring 3.3 cm with associated mural thrombus is unchanged measuring approximately 3.3 cm greatest axial dimension. Stable since December of 2020. There is no gastrohepatic or hepatoduodenal ligament lymphadenopathy. No retroperitoneal or  mesenteric lymphadenopathy. No pelvic sidewall lymphadenopathy. Reproductive: Unremarkable by CT. Other: No ascites. Signs of body wall edema which have developed since the previous study. This is along the RIGHT and LEFT flank. Musculoskeletal: Chronic T12 loss of height approximately 40% and associated degenerative changes throughout the spine without change. IMPRESSION: 1. Signs of glandular and peripancreatic necrosis with walled-off necrosis involving the gland and the peripancreatic fat. Inflammatory changes track into the retroperitoneum and lesser sac as well as transverse mesocolon and show mild improvement in some areas as  discussed above, still with luminal gas and or peripheral enhancement not allowing for exclusion of concomitant infection. 2. 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 3. Extensive nodular inflammatory changes in the LEFT retroperitoneum without focal collection 4. High-grade narrowing of the SMV/portal vein greatest below the portal splenic confluence is similar to the prior exam, attention on follow-up. Main portal vein remains patent into the liver. Peripheral branches of the SMV also appear patent. 5. Concomitant high-grade narrowing of the splenic vein 6. Focal rightward infrarenal abdominal aortic dilation with associated mural thrombus/extensive soft plaque extends 12 mm beyond the expected lumen and shows little change with a maximal caliber of 3.3 cm since 2020. Based on the focal appearance penetrating atherosclerotic ulcer or chronic pseudoaneurysm are considered. CT angiography at 1 year interval may be helpful given the focal nature of this finding. Vascular consultation could also be beneficial on follow-up. Aortic Atherosclerosis (ICD10-I70.0). Electronically Signed   By: Zetta Bills M.D.   On: 04/18/2021 09:36   CT IMAGE GUIDED DRAINAGE BY PERCUTANEOUS CATHETER  Result Date: 04/18/2021 INDICATION: History of pancreatitis and pseudocyst formation following ERCP with persistent elevated white blood cell count. Patient has been evaluated by the hepatobiliary service (Dr. Zenia Resides), and request has been made for placement of a percutaneous drainage catheter for infection source control purposes. EXAM: CT IMAGE GUIDED DRAINAGE BY PERCUTANEOUS CATHETER COMPARISON:  CT abdomen pelvis-04/18/2021; 04/13/2021 MEDICATIONS: The patient is currently admitted to the hospital and receiving intravenous antibiotics. The antibiotics were administered within an appropriate time frame prior to the initiation of the procedure.  ANESTHESIA/SEDATION: Moderate (conscious) sedation was employed during this procedure as administered by the Interventional Radiology RN. A total of Versed 2 mg and Fentanyl 100 mcg was administered intravenously. Moderate Sedation Time: 18 minutes. The patient's level of consciousness and vital signs were monitored continuously by radiology nursing throughout the procedure under my direct supervision. CONTRAST:  None COMPLICATIONS: None immediate. PROCEDURE: RADIATION DOSE REDUCTION: This exam was performed according to the departmental dose-optimization program which includes automated exposure control, adjustment of the mA and/or kV according to patient size and/or use of iterative reconstruction technique. Informed written consent was obtained from the patient after a discussion of the risks, benefits and alternatives to treatment. The patient was placed supine on the CT gantry and a pre procedural CT was performed re-demonstrating the known abscess/fluid collection within the pancreatic bed, extending along right iliopsoas musculature to the right lower abdomen with dominant component within the right lower abdomen measuring approximately 5.6 x 5.0 cm (image 49, series 2). The procedure was planned. A timeout was performed prior to the initiation of the procedure. The skin overlying the inferolateral aspect of the right mid abdomen was prepped and draped in the usual sterile fashion. The overlying soft tissues were anesthetized with 1% lidocaine with epinephrine. Appropriate trajectory was planned with the use of a 22 gauge spinal needle. An  18 gauge trocar needle was advanced into the abscess/fluid collection and a short Amplatz super stiff wire was coiled within the collection. Appropriate positioning was confirmed with a limited CT scan. The tract was serially dilated allowing placement of a 12 [place] all-purpose drainage catheter. Appropriate positioning was confirmed with a limited postprocedural CT scan.  Approximately 75 ml of thick, dark tan colored fluid was aspirated. The tube was connected to a drainage bag and sutured in place. A dressing was placed. The patient tolerated the procedure well without immediate post procedural complication. IMPRESSION: Successful CT guided placement of a 4 French all purpose drain catheter into the right lower abdominal quadrant pseudocyst with aspiration of 75 mL of thick, dark tan colored fluid. Samples were sent to the laboratory as requested by the ordering clinical team. Electronically Signed   By: Sandi Mariscal M.D.   On: 04/18/2021 10:51    Labs:  CBC: Recent Labs    04/18/21 0251 04/19/21 0533 04/20/21 0030 04/21/21 0323  WBC 15.0* 14.7* 16.7* 17.6*  HGB 7.6* 8.0* 8.2* 7.7*  HCT 23.3* 24.2* 25.8* 23.8*  PLT 396 363 454* 417*    COAGS: Recent Labs    04/13/21 1523 04/14/21 0230 04/15/21 0153 04/15/21 0909 04/15/21 2008 04/16/21 0649 04/17/21 0341  INR 2.4*  --  1.6*  --   --  1.4* 1.5*  APTT 52*   < >  --  62* 69* 74* 65*   < > = values in this interval not displayed.    BMP: Recent Labs    04/18/21 0251 04/19/21 0533 04/20/21 0030 04/21/21 0323  NA 135 139 138 137  K 2.8* 3.8 3.8 3.8  CL 102 107 105 102  CO2 25 23 26 27   GLUCOSE 88 120* 117* 115*  BUN 14 14 15 16   CALCIUM 7.3* 7.6* 7.7* 7.8*  CREATININE 1.05 0.99 0.87 0.90  GFRNONAA >60 >60 >60 >60    LIVER FUNCTION TESTS: Recent Labs    04/15/21 0153 04/16/21 0649 04/17/21 0637 04/18/21 0251  BILITOT 0.6 0.3 0.2* 0.4  AST 17 18 16 24   ALT 18 17 15 17   ALKPHOS 98 92 67 77  PROT 5.6* 5.8* 5.3* 4.8*  ALBUMIN 1.7* 1.7* 1.5* <1.5*    Assessment and Plan:  56 y.o. male with pancreatitis found to have RLQ fluid collection concerning for abscess who underwent percutaneous drain placement in IR 04/18/21 seen today for drain follow up.   WBC up, 17.6 (16.7 yesterday) afebrile  Drain Location: RLQ Size: Fr size: 10 Fr Date of placement: 04/18/21 Currently to: Drain  collection device: gravity 24 hour output:  Output by Drain (mL) 04/19/21 0701 - 04/19/21 1900 04/19/21 1901 - 04/20/21 0700 04/20/21 0701 - 04/20/21 1900 04/20/21 1901 - 04/21/21 0700 04/21/21 0701 - 04/21/21 1603  Closed System Drain 1 Right RLQ Other (Comment) 12 Fr.  75  0 50    Interval imaging/drain manipulation:  none  Current examination: Flushes/aspirates easily.  Insertion site unremarkable. Suture and stat lock in place. Dressed appropriately.   Plan: Continue TID flushes with 5 cc NS. Record output Q shift. Dressing changes QD or PRN if soiled.  Call IR APP or on call IR MD if difficulty flushing or sudden change in drain output.  Repeat imaging/possible drain injection once output < 10 mL/QD (excluding flush material.)  Discharge planning: Please contact IR APP or on call IR MD prior to patient d/c to ensure appropriate follow up plans are in place. Typically patient  will follow up with IR clinic 10-14 days post d/c for repeat imaging/possible drain injection. IR scheduler will contact patient with date/time of appointment. Patient will need to flush drain QD with 5 cc NS, record output QD, dressing changes every 2-3 days or earlier if soiled.   IR will continue to follow - please call with questions or concerns.       Electronically Signed: Tera Mater, PA-C 04/21/2021, 9:05 AM   I spent a total of 15 Minutes at the the patient's bedside AND on the patient's hospital floor or unit, greater than 50% of which was counseling/coordinating care for RLQ drain placement.   This chart was dictated using voice recognition software.  Despite best efforts to proofread,  errors can occur which can change the documentation meaning.

## 2021-04-21 NOTE — Progress Notes (Signed)
Physical Therapy Treatment Patient Details Name: Scott Foley MRN: [id number] DOB: Aug 28, 1965 Today's Date: 04/21/2021   History of Present Illness Pt is a 56 y.o. M who presents 04/13/2021 with abdominal pain. Admitted with acute pancreatitis with sepsis. S/p lap chole 03/02/2021 with abnormal IOC; underwent ERCP 12/22/222. S/p IR pec drain. Significant PMH: CAD, HTN, HLD, COPD, PE, CVA, IDA, tobacco abuse.    PT Comments    Pt received in supine, agreeable to therapy session and with good participation and tolerance for transfer, gait and exercise instruction. Obtained incentive spirometer for pt to use per MD order in chart, pt able to achieve 500-78mL and encouraged him to continue hourly x10 for pulmonary exercise and clearance. Pt performed functional mobility tasks and household distance gait trial with intermittent use of railing for stability and min guard for safety. Pt dyspneic and SpO2 desat to 89% with exertion, improved to 91% with standing break; 93% in supine (HOB >30 deg) at rest on RA. Pt continues to benefit from PT services to progress toward functional mobility goals.    Recommendations for follow up therapy are one component of a multi-disciplinary discharge planning process, led by the attending physician.  Recommendations may be updated based on patient status, additional functional criteria and insurance authorization.  Follow Up Recommendations  Home health PT     Assistance Recommended at Discharge PRN  Patient can return home with the following A little help with walking and/or transfers;A little help with bathing/dressing/bathroom;Help with stairs or ramp for entrance   Equipment Recommendations  None recommended by PT (will continue to assess)    Recommendations for Other Services       Precautions / Restrictions Precautions Precautions: Fall;Other (comment) Precaution Comments: R drain Restrictions Weight Bearing Restrictions: No     Mobility   Bed Mobility Overal bed mobility: Modified Independent             General bed mobility comments: use of bed features/HOB elevated    Transfers Overall transfer level: Needs assistance Equipment used: None Transfers: Sit to/from Stand Sit to Stand: Supervision           General transfer comment: pt using support of bed frame for stability upon rising    Ambulation/Gait Ambulation/Gait assistance: Min guard Gait Distance (Feet): 110 Feet Assistive device: None Gait Pattern/deviations: Decreased stride length, Step-through pattern       General Gait Details: slow but steady pace, decreased bilateral foot clearance, min guard for safety, pt frequently reaching for hallway railing/counters in room. May benefit from cane next session for stability. DOE to 2-3/4 with exertion and desat to 89% on RA   Stairs             Wheelchair Mobility    Modified Rankin (Stroke Patients Only)       Balance Overall balance assessment: Mild deficits observed, not formally tested                                          Cognition Arousal/Alertness: Awake/alert Behavior During Therapy: WFL for tasks assessed/performed, Flat affect Overall Cognitive Status: Within Functional Limits for tasks assessed                                          Exercises Other Exercises Other  Exercises: verbal review for supine/seated LE exercises (AP, HS, marching, LAQ, hip abduction TID) Other Exercises: pt performed IS x10 reps, variable 575mL to 700 mL    General Comments General comments (skin integrity, edema, etc.): DOE, encouraged use of IS hourly, IS obtained for pt per MD order in chart.      Pertinent Vitals/Pain Pain Assessment Pain Assessment: 0-10 Pain Score: 3  Pain Location: throat/upper chest from NGT and drain site Pain Descriptors / Indicators: Dull, Discomfort Pain Intervention(s): Limited activity within patient's tolerance,  Monitored during session, Repositioned    Home Living                          Prior Function            PT Goals (current goals can now be found in the care plan section) Acute Rehab PT Goals Patient Stated Goal: advanced diet PT Goal Formulation: With patient Time For Goal Achievement: 05/03/21 Progress towards PT goals: Progressing toward goals    Frequency    Min 3X/week      PT Plan Current plan remains appropriate    Co-evaluation              AM-PAC PT "6 Clicks" Mobility   Outcome Measure  Help needed turning from your back to your side while in a flat bed without using bedrails?: None Help needed moving from lying on your back to sitting on the side of a flat bed without using bedrails?: None Help needed moving to and from a bed to a chair (including a wheelchair)?: A Little Help needed standing up from a chair using your arms (e.g., wheelchair or bedside chair)?: A Little Help needed to walk in hospital room?: A Little Help needed climbing 3-5 steps with a railing? : A Lot 6 Click Score: 19    End of Session Equipment Utilized During Treatment: Gait belt Activity Tolerance: Patient tolerated treatment well;Other (comment) (DOE 3/4) Patient left: in bed;with call bell/phone within reach;with bed alarm set;Other (comment) (pt reports he sat up in chair earlier today) Nurse Communication: Mobility status PT Visit Diagnosis: Unsteadiness on feet (R26.81);Muscle weakness (generalized) (M62.81);Difficulty in walking, not elsewhere classified (R26.2);Pain     Time: 4196-2229 PT Time Calculation (min) (ACUTE ONLY): 28 min  Charges:  $Gait Training: 8-22 mins $Therapeutic Exercise: 8-22 mins                     Scott Foley P., PTA Acute Rehabilitation Services Pager: [phone] Office: [place] 04/21/2021, 4:01 PM

## 2021-04-22 ENCOUNTER — Encounter (HOSPITAL_COMMUNITY): Payer: Self-pay | Admitting: Family Medicine

## 2021-04-22 ENCOUNTER — Institutional Professional Consult (permissible substitution): Payer: Medicare HMO | Admitting: Pulmonary Disease

## 2021-04-22 DIAGNOSIS — K8591 Acute pancreatitis with uninfected necrosis, unspecified: Secondary | ICD-10-CM | POA: Diagnosis not present

## 2021-04-22 LAB — CBC
HCT: 23.4 % — ABNORMAL LOW (ref 39.0–52.0)
Hemoglobin: 7.4 g/dL — ABNORMAL LOW (ref 13.0–17.0)
MCH: 28 pg (ref 26.0–34.0)
MCHC: 31.6 g/dL (ref 30.0–36.0)
MCV: 88.6 fL (ref 80.0–100.0)
Platelets: 391 10*3/uL (ref 150–400)
RBC: 2.64 MIL/uL — ABNORMAL LOW (ref 4.22–5.81)
RDW: 16.8 % — ABNORMAL HIGH (ref 11.5–15.5)
WBC: 15.5 10*3/uL — ABNORMAL HIGH (ref 4.0–10.5)
nRBC: 0.1 % (ref 0.0–0.2)

## 2021-04-22 LAB — GLUCOSE, CAPILLARY
Glucose-Capillary: 106 mg/dL — ABNORMAL HIGH (ref 70–99)
Glucose-Capillary: 112 mg/dL — ABNORMAL HIGH (ref 70–99)
Glucose-Capillary: 113 mg/dL — ABNORMAL HIGH (ref 70–99)
Glucose-Capillary: 84 mg/dL (ref 70–99)
Glucose-Capillary: 88 mg/dL (ref 70–99)
Glucose-Capillary: 94 mg/dL (ref 70–99)
Glucose-Capillary: 99 mg/dL (ref 70–99)

## 2021-04-22 LAB — BASIC METABOLIC PANEL
Anion gap: 8 (ref 5–15)
BUN: 16 mg/dL (ref 6–20)
CO2: 25 mmol/L (ref 22–32)
Calcium: 8 mg/dL — ABNORMAL LOW (ref 8.9–10.3)
Chloride: 102 mmol/L (ref 98–111)
Creatinine, Ser: 0.82 mg/dL (ref 0.61–1.24)
GFR, Estimated: >60 mL/min (ref >60)
Glucose, Bld: 130 mg/dL — ABNORMAL HIGH (ref 70–99)
Potassium: 3.9 mmol/L (ref 3.5–5.1)
Sodium: 135 mmol/L (ref 135–145)

## 2021-04-22 LAB — C-REACTIVE PROTEIN: CRP: 8.7 mg/dL — ABNORMAL HIGH (ref <1.0)

## 2021-04-22 LAB — HEPARIN LEVEL (UNFRACTIONATED): Heparin Unfractionated: 0.47 IU/mL (ref 0.30–0.70)

## 2021-04-22 MED ORDER — VITAL 1.5 CAL PO LIQD
840.0000 mL | ORAL | Status: DC
  Administered 2021-04-22 – 2021-04-28 (×7): 840 mL
  Filled 2021-04-22: qty 948
  Filled 2021-04-22 (×7): qty 1000

## 2021-04-22 MED ORDER — ENSURE ENLIVE PO LIQD
237.0000 mL | Freq: Two times a day (BID) | ORAL | Status: DC
  Administered 2021-04-22 – 2021-05-05 (×12): 237 mL via ORAL

## 2021-04-22 NOTE — Progress Notes (Signed)
Progress Note   Patient: Scott Foley GBE:[id number] DOB: 06-22-1965 DOA: 04/13/2021     9 DOS: the patient was seen and examined on 04/22/2021   Brief hospital course: 56 y.o. male with medical history significant of hx of CAD, HTN, hx of PE on eliquis, HLD, hxo of CVA in 2014, IDA with history of recent hospitalizaion on 03/16/21 for recurrent pancreatitis, sepsis due to bacteremia from klebsiella pneumoniae, covid-19 and respiratory failure requiring home oxygen on discharge.  Presented with a 4-day history of diffuse worsening abdominal pain.  He has had abdominal issues for the past 2 months.  He had a cholecystectomy on 12/21 and underwent ERCP on 12/22 since the intraoperative cholangiogram showed a CBD stone.  After ERCP he developed pancreatitis.  He was managed conservatively and then discharged home on 12/28.  He was readmitted to [facility] on 1/4 for recurrent pancreatitis and COVID-19.  Noted to have sepsis respiratory failure.  Blood cultures were positive for Klebsiella pneumonia.  He was treated with Invanz for 10 days.  Discharged on 2 L of oxygen. He had a postop visit with surgeon on 04/12/21 when he complained of abdominal pain.  A CT scan was subsequently done which showed pancreatic necrosis with gas within the pancreatic fluid collections.  He was sent over to [facility] emergency department.  He was seen by general surgery.  Hospitalized for further management.  Placed on antibiotics.  Also noted to have acute kidney injury.  Denied any significant alcohol use. Patient is seen imaging and surgery as well as gastroenterology.  Interventional radiology was consulted for aspiration of the fluid collection which was performed on 2/6.   Assessment and Plan: * Necrotizing pancreatitis- (present on admission) This is either a third episode of pancreatitis or sequelae of pancreatitis from his episode in January.  His lipase level was noted to be normal.  He does have  significant leukocytosis.  CT scan was done at [facility].  Do not have access to that report but according to the H&P it showed progressive severe pancreatitis with extensive fluid collection surrounding the pancreas and extending into the right retroperitoneum.  Many of these fluid collections containing gas concerning for infection.  Compression of splenic vein and SMV was noted although they appear to be patent.  Patient was started on meropenem.   Gastroenterology and general surgery have been consulted.  No role for endoscopic drainage at this time.  Interventional radiology consulted for percutaneous drainage which was done on 2/6  Patient is stable.  Pain is reasonably well controlled.  He is tolerating his tube feedings. -clears started on 2/7 -will advance diet to low fat and see about changing tube feeds to noctornal  Protein-calorie malnutrition, severe Started on tube feedings which she is tolerating well.  Continue to monitor. Nutrition Status: Nutrition Problem: Severe Malnutrition Etiology: acute illness (pancreatitis) Signs/Symptoms: moderate muscle depletion, moderate fat depletion, energy intake < or equal to 50% for > or equal to 5 days, percent weight loss (11% weight loss within 3 months) Percent weight loss: 11 % Interventions: Tube feeding    Hypoglycemia Resolved with initiation of fluids and nutrition.  Normocytic anemia- (present on admission) Significant drop in hemoglobin noted most of which appears to be dilutional.   Anemia panel reviewed.  Vitamin B12 noted to be low normal at 254.  Being supplemented.  Folate 8.2.  Ferritin 1033.  TIBC 137.  Iron 16. Hemoglobin low but stable.  No overt bleeding identified -will type and  screen for the AM.  Chronic respiratory failure with hypoxia ([place])- (present on admission) Recently recovered from COVID-19 infection.  Was discharged on 2 L of oxygen by nasal cannula.  Chest x-ray continues to show opacities which could  be from his recent infection rather than any infection.   Respiratory status is stable.  Hypokalemia- (present on admission) Hypomagnesemia -replete  AKI (acute kidney injury) ([place])- (present on admission) Presented with creatinine of 2.49. This was likely prerenal in the setting of sepsis along with nausea and vomiting over the past few days.   Patient aggressively hydrated.  Renal function has improved and back to normal now.  Monitor urine output.    Sepsis ([place])- (present on admission) On admission he met sepsis criteria with tachycardia, fever to 100.8, WBC to 18.2.  Recently noted to have Klebsiella bacteremia earlier in January when he was hospitalized in [place].  Blood cultures done here in the hospital have been negative so far. Sepsis physiology appears to have improved.  WBC is better.  Remains afebrile.  Follow-up on cultures from fluid sent after aspiration 2/6- kleb/citrobacter -ID consult for duration of abx  History of pulmonary embolism- (present on admission) Eliquis is currently on hold.  He is on heparin infusion.    Essential hypertension- (present on admission) Sinus tachycardia  ACE inhibitor on hold.  Borderline low blood pressures noted.   Sinus tachycardia likely secondary to severe illness. He was started back on beta-blocker due to concern for rebound tachycardia.  Heart rate is better.  Chronic obstructive pulmonary disease ([place])- (present on admission) No signs of exacerbation. Continue trelegy daily and albuterol prn.  Hyperlipemia- (present on admission) Statin on hold currently.  Triglyceride was 121 in December.  Coronary artery disease with stable angina pectoris ([place])- (present on admission) Stable.  Plavix currently on hold.      Subjective: still hurts some  Physical Exam: Vitals:   04/22/21 0407 04/22/21 0454 04/22/21 0738 04/22/21 0856  BP: 111/75  123/80   Pulse: 97  100   Resp: 19  (!) 24   Temp:   98.5 F (36.9 C)    TempSrc:   Oral   SpO2: 93%  93% 93%  Weight:  90.3 kg    Height:        General: Appearance:    Obese male in no acute distress     Lungs:     respirations unlabored  Heart:    Tachycardic.   MS:   All extremities are intact.    Neurologic:   Awake, alert     Data Reviewed: Cbc with decreasing Hgb  Family Communication: wife at bedside 2/09  Disposition: Status is: Inpatient  Remains inpatient appropriate because: advance diet    Planned Discharge Destination: Home  Time spent: 35 minutes  Author: Geradine Girt, DO 04/22/2021 12:13 PM  For on call review [email or web address].

## 2021-04-22 NOTE — Progress Notes (Signed)
Nutrition Follow-up  DOCUMENTATION CODES:   Severe malnutrition in context of acute illness/injury  INTERVENTION:  Transition to nocturnal TF via Cortrak: -Vital 1.5 @ 61ml/hr x 12 hours (887ml) to be administered from 1800-0600 -96ml Prosource TF BID -54ml free water Q4H  Nocturnal TF regimen provides 1340 kcals, 78 grams protein, 817ml total free water  (Meets 58% and 65% estimated calorie and protein needs, respectively)  -Ensure Enlive po BID, each supplement provides 350 kcal and 20 grams of protein.  NUTRITION DIAGNOSIS:   Severe Malnutrition related to acute illness (pancreatitis) as evidenced by moderate muscle depletion, moderate fat depletion, energy intake < or equal to 50% for > or equal to 5 days, percent weight loss (11% weight loss within 3 months).  ongoing  GOAL:   Patient will meet greater than or equal to 90% of their needs  Addressing with TF and supplements  MONITOR:   I & O's, Labs, Diet advancement  REASON FOR ASSESSMENT:   New TF    ASSESSMENT:   56 yo male admitted with severe acute pancreatitis with sepsis, AKI. PMH includes CAD, HTN, PE, HLD, CVA, IDA, pancreatitis, chronic respiratory failure.  2/03 - Cortrak placed then advanced by fluoroscopy into the distal duodenum/proximal jejunum 2/06 - IR consulted for percutaneous drainage 2/07 - diet advanced to full liquids  2/10 - diet advanced to Heart Healthy  Pt reports abdominal pain is improving daily and denies any further N/V. Tube feeding has been running at goal via Cortrak (current orders are for Vital 1.5 @ 57ml/hr w/ 33ml Prosource TF BID and 59ml free water Q4H). RD consulted to transition pt to nocturnal TF regimen given pt tolerating PO diet per MD and RN, though no PO intake documented since diet advancement. Will monitor for adequacy of PO intake while administering nocturnal TF. Cortrak should not be removed until pt consistently meeting >75% estimated needs PO.   UOP: 862ml x24  hours Drain output: 68ml x24 hours I/O: +18.5L since admit  Medications: protonix, vitamin B12 IVF: D5-NS @ 52ml/hr Labs reviewed.  CBGs: 84-116 x24 hours   Diet Order:   Diet Order             Diet Heart Room service appropriate? Yes; Fluid consistency: Thin  Diet effective now                   EDUCATION NEEDS:   Not appropriate for education at this time  Skin:  Skin Assessment: Reviewed RN Assessment  Last BM:  2/10  Height:   Ht Readings from Last 1 Encounters:  04/19/21 5\' 4"  (1.626 m)    Weight:   Wt Readings from Last 1 Encounters:  04/22/21 90.3 kg    BMI:  Body mass index is 34.17 kg/m.  Estimated Nutritional Needs:   Kcal:  2300-2500  Protein:  120 gm  Fluid:  2.3-2.5 L     Theone Stanley., MS, RD, LDN (she/her/hers) RD pager number and weekend/on-call pager number located in [place].

## 2021-04-22 NOTE — Plan of Care (Signed)
  Problem: Pain Managment: Goal: General experience of comfort will improve Outcome: Progressing   Problem: Safety: Goal: Ability to remain free from injury will improve Outcome: Progressing   Problem: Skin Integrity: Goal: Risk for impaired skin integrity will decrease Outcome: Progressing   

## 2021-04-22 NOTE — Progress Notes (Signed)
[facility] for Infectious Disease    Date of Admission:  04/13/2021   Total days of antibiotics 10-meropenem          ID: Scott Foley is a 56 y.o. male with   Principal Problem:   Necrotizing pancreatitis Active Problems:   Coronary artery disease with stable angina pectoris (HCC)   Hyperlipemia   Chronic obstructive pulmonary disease (HCC)   Essential hypertension   History of pulmonary embolism   Sepsis ([place])   AKI (acute kidney injury) ([place])   Hypokalemia   Chronic respiratory failure with hypoxia (HCC)   Normocytic anemia   Hypoglycemia   Protein-calorie malnutrition, severe    Subjective: Afebrile. Still having some abdominal discomfort this morning. Did not have any vomiting yesterday. Only 2 loose stools  44mL drained this morning- jp drain Medications:   feeding supplement  237 mL Oral BID BM   feeding supplement (PROSource TF)  45 mL Per Tube BID   feeding supplement (VITAL 1.5 CAL)  840 mL Per Tube Q24H   fluticasone furoate-vilanterol  1 puff Inhalation Daily   And   umeclidinium bromide  1 puff Inhalation Daily   free water  30 mL Per Tube Q4H   iohexol  100 mL Intravenous Once   metoprolol tartrate  25 mg Oral BID   pantoprazole  40 mg Oral BID   sodium chloride flush  5 mL Intracatheter Q8H   vitamin B-12  1,000 mcg Per Tube Daily    Objective: Vital signs in last 24 hours: Temp:  [98 F (36.7 C)-98.7 F (37.1 C)] 98.7 F (37.1 C) (02/10 1336) Pulse Rate:  [97-101] 100 (02/10 1336) Resp:  [17-24] 18 (02/10 1336) BP: (111-148)/(75-96) 126/76 (02/10 1336) SpO2:  [91 %-97 %] 91 % (02/10 1336) Weight:  [90.3 kg] 90.3 kg (02/10 0454)  Physical Exam  Constitutional: He is oriented to person, place, and time. He appears well-developed and well-nourished. No distress.  HENT:  Mouth/Throat: Oropharynx is clear and moist. No oropharyngeal exudate.  Cardiovascular: Normal rate, regular rhythm and normal heart sounds. Exam reveals no  gallop and no friction rub.  No murmur heard.  Pulmonary/Chest: Effort normal and breath sounds normal. No respiratory distress. He has no wheezes.  Abdominal: Soft. Bowel sounds are normal. He exhibits no distension. There is no tenderness. RLQ drain with pulurent tan fibrinous fluid Lymphadenopathy:  He has no cervical adenopathy.  Neurological: He is alert and oriented to person, place, and time.  Skin: Skin is warm and dry. No rash noted. No erythema.  Psychiatric: He has a normal mood and affect. His behavior is normal.    Lab Results Recent Labs    04/21/21 0323 04/22/21 0335  WBC 17.6* 15.5*  HGB 7.7* 7.4*  HCT 23.8* 23.4*  NA 137 135  K 3.8 3.9  CL 102 102  CO2 27 25  BUN 16 16  CREATININE 0.90 0.82   Liver Panel No results for input(s): PROT, ALBUMIN, AST, ALT, ALKPHOS, BILITOT, BILIDIR, IBILI in the last 72 hours. Sedimentation Rate No results for input(s): ESRSEDRATE in the last 72 hours. C-Reactive Protein Recent Labs    04/20/21 1112 04/22/21 1120  CRP 10.1* 8.7*    Microbiology: Reviewed  Hx of amp C producing klebseilla Studies/Results: No results found.   Assessment/Plan: Necrotizing pancreatitis with klebseilla and citrobacter isolated from abscess fluid= continue on meropenem. Likely will need picc line for at least 2 addn weeks, likely longer but can readdress at extending  that period depending on his clinical presentation. Still appears to have significant abdominal pain. He may need to get picc line sooner than later if he keeps losing piv access  Severe protein-calorie malnutrition = continues on tube feeds but is starting to transition to liquids as he tolerates and see if mets nutritional goals  Left hand piv induration = is improving  Leukocytosis = mildly improved  Anemia = discussed with dr Eliseo Squires. May need rbc transfusion tomorrow  Dr West Bali is available over the weekend for questions  [facility] for  Infectious Diseases Pager: [phone]  04/22/2021, 3:08 PM

## 2021-04-22 NOTE — Plan of Care (Signed)
°  Problem: Coping: Goal: Level of anxiety will decrease Outcome: Progressing   Problem: Elimination: Goal: Will not experience complications related to bowel motility Outcome: Progressing   Problem: Safety: Goal: Ability to remain free from injury will improve Outcome: Progressing   Problem: Skin Integrity: Goal: Risk for impaired skin integrity will decrease Outcome: Progressing

## 2021-04-22 NOTE — Progress Notes (Signed)
Patient ID: Scott Foley, male   DOB: May 13, 1965, 56 y.o.   MRN: [id number] [facility] Surgery Progress Note     Subjective: CC-  Abdominal pain improving daily. No further nausea/vomiting. Tfs remain at goal  Objective: Vital signs in last 24 hours: Temp:  [98 F (36.7 C)-98.5 F (36.9 C)] 98.5 F (36.9 C) (02/10 0738) Pulse Rate:  [97-101] 100 (02/10 0738) Resp:  [17-24] 24 (02/10 0738) BP: (111-148)/(75-96) 123/80 (02/10 0738) SpO2:  [93 %-97 %] 93 % (02/10 0856) Weight:  [90.3 kg] 90.3 kg (02/10 0454) Last BM Date: 04/21/21  Intake/Output from previous day: 02/09 0701 - 02/10 0700 In: 5649.7 [I.V.:2590.1; NG/GT:3054.6] Out: 850 [Urine:800; Drains:50] Intake/Output this shift: Total I/O In: 112 [I.V.:112] Out: -   PE: Gen:  Alert, NAD, pleasant Card: rrr Pulm:  rate and effort normal Abd: Soft, nontender nondistended. IR perc drain RLQ with expected purulent/pancreatic necrosis fluid   Lab Results:  Recent Labs    04/21/21 0323 04/22/21 0335  WBC 17.6* 15.5*  HGB 7.7* 7.4*  HCT 23.8* 23.4*  PLT 417* 391   BMET Recent Labs    04/21/21 0323 04/22/21 0335  NA 137 135  K 3.8 3.9  CL 102 102  CO2 27 25  GLUCOSE 115* 130*  BUN 16 16  CREATININE 0.90 0.82  CALCIUM 7.8* 8.0*   PT/INR No results for input(s): LABPROT, INR in the last 72 hours. CMP     Component Value Date/Time   NA 135 04/22/2021 0335   NA 140 04/01/2021 1047   K 3.9 04/22/2021 0335   CL 102 04/22/2021 0335   CO2 25 04/22/2021 0335   GLUCOSE 130 (H) 04/22/2021 0335   BUN 16 04/22/2021 0335   BUN 24 04/01/2021 1047   CREATININE 0.82 04/22/2021 0335   CALCIUM 8.0 (L) 04/22/2021 0335   PROT 4.8 (L) 04/18/2021 0251   PROT 6.5 04/01/2021 1047   ALBUMIN <1.5 (L) 04/18/2021 0251   ALBUMIN 3.1 (L) 04/01/2021 1047   AST 24 04/18/2021 0251   ALT 17 04/18/2021 0251   ALKPHOS 77 04/18/2021 0251   BILITOT 0.4 04/18/2021 0251   BILITOT 0.6 04/01/2021 1047   GFRNONAA >60  04/22/2021 0335   GFRAA 105 12/15/2019 1034   Lipase     Component Value Date/Time   LIPASE 36 04/13/2021 1523       Studies/Results: No results found.  Anti-infectives: Anti-infectives (From admission, onward)    Start     Dose/Rate Route Frequency Ordered Stop   04/16/21 1600  meropenem (MERREM) 1 g in sodium chloride 0.9 % 100 mL IVPB        1 g 200 mL/hr over 30 Minutes Intravenous Every 8 hours 04/16/21 1234     04/13/21 2000  meropenem (MERREM) 1 g in sodium chloride 0.9 % 100 mL IVPB  Status:  Discontinued        1 g 200 mL/hr over 30 Minutes Intravenous Every 12 hours 04/13/21 1934 04/16/21 1234   04/13/21 1800  ceFEPIme (MAXIPIME) 2 g in sodium chloride 0.9 % 100 mL IVPB  Status:  Discontinued        2 g 200 mL/hr over 30 Minutes Intravenous Every 12 hours 04/13/21 1731 04/13/21 1852   04/13/21 1715  levofloxacin (LEVAQUIN) IVPB 750 mg  Status:  Discontinued        750 mg 100 mL/hr over 90 Minutes Intravenous  Once 04/13/21 1713 04/13/21 1728   04/13/21 1715  metroNIDAZOLE (FLAGYL) IVPB 500  mg  Status:  Discontinued        500 mg 100 mL/hr over 60 Minutes Intravenous  Once 04/13/21 1713 04/13/21 1852        Assessment/Plan Post-ERCP pancreatitis, now with signs of infected necrosis  - s/p lap chole 03/02/21 by Dr. Lilia Pro with abnormal IOC >> underwent ERCP 03/03/21 - s/p IR perc drain RLQ collection, 175cc tan fluid over last 24 h, sent for culture - pending - tolerating tube feeds - consider holding tube feeds and allowing regular/low fat diet, calorie count - if able to keep up with needs would not necessarily need any further tube feeds - heparin gtt resumed -  Continue IV antibiotics.   Our service will be available this weekend if questions arise but otherwise will plan to see again Monday.   ID - merrem 2/1>> would recommend infectious disease consultation for antibiotic duration and potentially coordination of regimen; may need home IV abx FEN - PP  TF,  okay for clears from [facility] perspective.  VTE - hold plavix/ eliquis, heparin gtt   Foley - none   CAD HTN HLD COPD Hx PE ~3 years ago on eliquis (last dose 2/1) Hx CVA in 2014 on plavix (last dose 2/1) IDA Tobacco abuse - quit smoking 5 months ago  Moderate Medical Decision Making   LOS: 9 days   Ileana Roup, [facility] 04/22/2021, 8:57 AM Please see Am[facility] for pager number during day hours 7:00am-4:30pm

## 2021-04-22 NOTE — Progress Notes (Signed)
Mobility Specialist: Progress Note   04/22/21 1417  Mobility  Bed Position Chair  Activity Ambulated with assistance in hallway  Level of Assistance Modified independent, requires aide device or extra time  Assistive Device Cane  Distance Ambulated (ft) 240 ft  Activity Response Tolerated well  $Mobility charge 1 Mobility   Received pt in chair having no complaints and agreeable to mobility. Asymptomatic throughout ambulation, returned back to chair w/ call bell in reach and all needs met.  [facility] Alysia Scism Mobility Specialist Mobility Specialist 5 North: [phone] Mobility Specialist 6 North: [phone]

## 2021-04-22 NOTE — Progress Notes (Signed)
Physical Therapy Treatment Patient Details Name: Scott Foley MRN: [id number] DOB: 1965-12-19 Today's Date: 04/22/2021   History of Present Illness Pt is a 56 y.o. M who presents 04/13/2021 with abdominal pain. Admitted with acute pancreatitis with sepsis. S/p lap chole 03/02/2021 with abnormal IOC; underwent ERCP 12/22/222. S/p IR pec drain. Significant PMH: CAD, HTN, HLD, COPD, PE, CVA, IDA, tobacco abuse.    PT Comments    Pt received in supine, agreeable to therapy session and with good participation and tolerance for hallway gait progression using cane for stability and stair negotiation training. Pt needing up to minA for stair ascent/descent with cues for technique and use of cane/R wall rail. Pt able to perform transfers and gait with up to Supervision using cane, pt needs min guard for safety for short distances without AD. Pt continues to benefit from PT services to progress toward functional mobility goals. DME recommendation updated below per discussion with supervising PT Tanzania G.   Recommendations for follow up therapy are one component of a multi-disciplinary discharge planning process, led by the attending physician.  Recommendations may be updated based on patient status, additional functional criteria and insurance authorization.  Follow Up Recommendations  Home health PT     Assistance Recommended at Discharge PRN  Patient can return home with the following A little help with walking and/or transfers;A little help with bathing/dressing/bathroom;Help with stairs or ramp for entrance   Equipment Recommendations  Cane    Recommendations for Other Services       Precautions / Restrictions Precautions Precautions: Fall;Other (comment) Precaution Comments: RLQ drain Restrictions Weight Bearing Restrictions: No     Mobility  Bed Mobility Overal bed mobility: Modified Independent      General bed mobility comments: use of bed features/HOB elevated     Transfers Overall transfer level: Needs assistance Equipment used: None, Straight cane Transfers: Sit to/from Stand Sit to Stand: Supervision           General transfer comment: pt using support of bed frame behind BLE for stability upon rising, from chair using cane with improved stability    Ambulation/Gait Ambulation/Gait assistance: Min guard, Supervision Gait Distance (Feet): 150 Feet Assistive device: Straight cane Gait Pattern/deviations: Decreased stride length, Step-through pattern Gait velocity: decreased     General Gait Details: slow but steady pace, using cane pt steadier and no LOB, not needing hallway rails; DOE to 2/4 with exertion and SpO2 WFL on RA   Stairs Stairs: Yes Stairs assistance: Min assist, +2 safety/equipment Stair Management: One rail Right, Step to pattern, Forwards, With cane Number of Stairs: 3 General stair comments: cues for sequencing using stronger leg and safe use of cane, second person present for line mgmt due to multiple lines, no LOB, pt DOE to 2/4 needed cues for activity pacing/PLB      Balance Overall balance assessment: Mild deficits observed, not formally tested         Cognition Arousal/Alertness: Awake/alert Behavior During Therapy: WFL for tasks assessed/performed, Flat affect Overall Cognitive Status: Within Functional Limits for tasks assessed                Exercises Other Exercises Other Exercises: pt performed IS x5 reps, achieves ~317mL, reports he hasn't used it yet today. Encouraged hourly or every other hour use for pulmonary strengthening/clearance    General Comments General comments (skin integrity, edema, etc.): cues for activity pacing/slow deep breaths, pt given chair follow for safety but didn't need to sit until back to  room      Pertinent Vitals/Pain Pain Assessment Pain Assessment: Faces Faces Pain Scale: Hurts a little bit Pain Location: cortrak related discomfort Pain Descriptors /  Indicators: Discomfort Pain Intervention(s): Monitored during session, Repositioned     PT Goals (current goals can now be found in the care plan section) Acute Rehab PT Goals Patient Stated Goal: advanced diet, to go home PT Goal Formulation: With patient Time For Goal Achievement: 05/03/21 Progress towards PT goals: Progressing toward goals    Frequency    Min 3X/week      PT Plan Equipment recommendations need to be updated       AM-PAC PT "6 Clicks" Mobility   Outcome Measure  Help needed turning from your back to your side while in a flat bed without using bedrails?: None Help needed moving from lying on your back to sitting on the side of a flat bed without using bedrails?: None Help needed moving to and from a bed to a chair (including a wheelchair)?: A Little Help needed standing up from a chair using your arms (e.g., wheelchair or bedside chair)?: A Little Help needed to walk in hospital room?: A Little Help needed climbing 3-5 steps with a railing? : A Lot (mod cues) 6 Click Score: 19    End of Session Equipment Utilized During Treatment: Gait belt Activity Tolerance: Patient tolerated treatment well Patient left: with call bell/phone within reach;in chair;with chair alarm set Nurse Communication: Mobility status PT Visit Diagnosis: Unsteadiness on feet (R26.81);Muscle weakness (generalized) (M62.81);Difficulty in walking, not elsewhere classified (R26.2);Pain     Time: 1594-5859 PT Time Calculation (min) (ACUTE ONLY): 37 min  Charges:  $Gait Training: 8-22 mins $Therapeutic Activity: 8-22 mins                     Dylon Correa P., PTA Acute Rehabilitation Services Pager: [phone] Office: [place] 04/22/2021, 12:46 PM

## 2021-04-22 NOTE — Progress Notes (Signed)
Referring Physician(s): Dr. Sandi Mariscal   Supervising Physician: Corrie Mckusick  Patient Status:  [facility] - In-pt  Chief Complaint:  Pancreatitis with intra abd fluid development, s/p RLQ drain placement  on 2/6   Subjective: Pt sitting upright in bed. He denies pain at insertion site, but endorses tenderness with palpation and movement. He states he is feeling a little better today.  Allergies: Penicillin g  Medications: Prior to Admission medications   Medication Sig Start Date End Date Taking? Authorizing Provider  albuterol (VENTOLIN HFA) 108 (90 Base) MCG/ACT inhaler Inhale 2 puffs into the lungs every 6 (six) hours as needed for wheezing or shortness of breath. 04/04/21  Yes Rip Harbour, NP  apixaban (ELIQUIS) 5 MG TABS tablet Take 1 tablet (5 mg total) by mouth 2 (two) times daily. 04/01/21  Yes Rip Harbour, NP  clopidogrel (PLAVIX) 75 MG tablet Take 1 tablet (75 mg total) by mouth daily. 04/01/21  Yes Rip Harbour, NP  Evolocumab (REPATHA) 140 MG/ML SOSY Inject 140 mg into the skin every 14 (fourteen) days. 12/30/20  Yes [name], [name], MD  Fluticasone-Umeclidin-Vilant (TRELEGY ELLIPTA) 100-62.5-25 MCG/ACT AEPB Inhale 1 puff into the lungs daily. 04/04/21  Yes Rip Harbour, NP  Ibuprofen-diphenhydrAMINE Cit (ADVIL PM PO) Take 1 tablet by mouth at bedtime.   Yes [provider]  isosorbide mononitrate (IMDUR) 60 MG 24 hr tablet Take 1 tablet (60 mg total) by mouth daily. 04/01/21  Yes Rip Harbour, NP  lisinopril (ZESTRIL) 40 MG tablet Take 1 tablet (40 mg total) by mouth daily. 04/01/21  Yes Rip Harbour, NP  metoprolol tartrate (LOPRESSOR) 50 MG tablet Take 1 tablet (50 mg total) by mouth 2 (two) times daily. 04/01/21  Yes Rip Harbour, NP  Omega-3 Fatty Acids (FISH OIL) 1000 MG CAPS Take 1 capsule (1,000 mg total) by mouth 2 (two) times daily. 11/30/20  Yes [name], [name], MD  pantoprazole (PROTONIX) 40 MG tablet Take 1 tablet (40 mg total) by  mouth daily. Patient taking differently: Take 40 mg by mouth 2 (two) times daily. 02/15/21  Yes Rip Harbour, NP  rosuvastatin (CRESTOR) 40 MG tablet Take 1 tablet (40 mg total) by mouth daily. Patient taking differently: Take 40 mg by mouth at bedtime. 04/01/21  Yes Rip Harbour, NP     Vital Signs: BP 126/76 (BP Location: Left Arm)    Pulse 100    Temp 98.7 F (37.1 C) (Oral)    Resp 18    Ht 5\' 4"  (1.626 m)    Wt 199 lb 1.2 oz (90.3 kg)    SpO2 91%    BMI 34.17 kg/m   Physical Exam Constitutional:      Appearance: Normal appearance. He is not ill-appearing.  HENT:     Head: Normocephalic and atraumatic.  Pulmonary:     Effort: Pulmonary effort is normal. No respiratory distress.  Abdominal:     Comments: RLQ drain intact with sutures and statlock in place. Mild redness and dried blood noted to insertion site. No swelling, active bleeding or oozing noted. Dressing C/D/I. ~25 cc tan, feculent OP in gravity bag with 50 cc documented in Epic. Drain flushes, aspirates easily.   Skin:    General: Skin is warm and dry.  Neurological:     Mental Status: He is alert and oriented to person, place, and time.  Psychiatric:        Mood and Affect: Mood normal.  Behavior: Behavior normal.        Thought Content: Thought content normal.        Judgment: Judgment normal.    Imaging: No results found.  Labs:  CBC: Recent Labs    04/19/21 0533 04/20/21 0030 04/21/21 0323 04/22/21 0335  WBC 14.7* 16.7* 17.6* 15.5*  HGB 8.0* 8.2* 7.7* 7.4*  HCT 24.2* 25.8* 23.8* 23.4*  PLT 363 454* 417* 391    COAGS: Recent Labs    04/13/21 1523 04/14/21 0230 04/15/21 0153 04/15/21 0909 04/15/21 2008 04/16/21 0649 04/17/21 0341  INR 2.4*  --  1.6*  --   --  1.4* 1.5*  APTT 52*   < >  --  62* 69* 74* 65*   < > = values in this interval not displayed.    BMP: Recent Labs    04/19/21 0533 04/20/21 0030 04/21/21 0323 04/22/21 0335  NA 139 138 137 135  K 3.8 3.8 3.8  3.9  CL 107 105 102 102  CO2 23 26 27 25   GLUCOSE 120* 117* 115* 130*  BUN 14 15 16 16   CALCIUM 7.6* 7.7* 7.8* 8.0*  CREATININE 0.99 0.87 0.90 0.82  GFRNONAA >60 >60 >60 >60    LIVER FUNCTION TESTS: Recent Labs    04/15/21 0153 04/16/21 0649 04/17/21 0637 04/18/21 0251  BILITOT 0.6 0.3 0.2* 0.4  AST 17 18 16 24   ALT 18 17 15 17   ALKPHOS 98 92 67 77  PROT 5.6* 5.8* 5.3* 4.8*  ALBUMIN 1.7* 1.7* 1.5* <1.5*    Assessment and Plan: S/p RLQ drain placement  on 2/6.    Pt sitting upright in bed. He denies pain at insertion site, but endorses tenderness with palpation and movement. He states he is feeling a little better today.   RLQ drain intact with sutures and statlock in place. Mild redness and dried blood noted to insertion site. No swelling, active bleeding or oozing noted. Dressing C/D/I. ~25 cc tan, feculent OP in gravity bag with 50 cc documented in Epic. Drain flushes, aspirates easily.   WBC trending down. Afebrile. VSS  Continue documenting OP in Epic q shift.  Continue flushing TID.  Change dressing q shift or as needed.  Please call IR with questions or concerns.    Electronically Signed: Tyson Alias, NP 04/22/2021, 4:01 PM   I spent a total of 15 Minutes at the the patient's bedside AND on the patient's hospital floor or unit, greater than 50% of which was counseling/coordinating care for S/p RLQ drain placement  on 2/6 for intraabdominal fluid collection.

## 2021-04-22 NOTE — Progress Notes (Signed)
ANTICOAGULATION CONSULT NOTE - Follow Up Consult  Pharmacy Consult for heparin Indication:  h/o PE  Labs: Recent Labs    04/20/21 0030 04/21/21 0323 04/22/21 0335  HGB 8.2* 7.7* 7.4*  HCT 25.8* 23.8* 23.4*  PLT 454* 417* 391  HEPARINUNFRC 0.29* 0.46 0.47  CREATININE 0.87 0.90 0.82     Assessment: 56yo male on heparin with initial dosing while Eliquis on hold.  Heparin level 0.47  Goal of Therapy:  Heparin 0.3-0.7 units/ml   Plan:  Continue heparin at 2400 units/hr Daily heparin level - next 2/11 Monitor CBC and s/sx of bleeding daily F/u with apixaban resume- continue to hold 2/10  Thank you Vaughan Basta BS, PharmD, BCPS Clinical Pharmacist 04/22/2021, 9:07 AM

## 2021-04-23 DIAGNOSIS — K8591 Acute pancreatitis with uninfected necrosis, unspecified: Secondary | ICD-10-CM | POA: Diagnosis not present

## 2021-04-23 LAB — PREPARE RBC (CROSSMATCH)

## 2021-04-23 LAB — GLUCOSE, CAPILLARY
Glucose-Capillary: 112 mg/dL — ABNORMAL HIGH (ref 70–99)
Glucose-Capillary: 112 mg/dL — ABNORMAL HIGH (ref 70–99)
Glucose-Capillary: 115 mg/dL — ABNORMAL HIGH (ref 70–99)
Glucose-Capillary: 119 mg/dL — ABNORMAL HIGH (ref 70–99)
Glucose-Capillary: 124 mg/dL — ABNORMAL HIGH (ref 70–99)
Glucose-Capillary: 89 mg/dL (ref 70–99)
Glucose-Capillary: 91 mg/dL (ref 70–99)

## 2021-04-23 LAB — BASIC METABOLIC PANEL
Anion gap: 9 (ref 5–15)
BUN: 18 mg/dL (ref 6–20)
CO2: 25 mmol/L (ref 22–32)
Calcium: 8.2 mg/dL — ABNORMAL LOW (ref 8.9–10.3)
Chloride: 101 mmol/L (ref 98–111)
Creatinine, Ser: 0.79 mg/dL (ref 0.61–1.24)
GFR, Estimated: >60 mL/min (ref >60)
Glucose, Bld: 110 mg/dL — ABNORMAL HIGH (ref 70–99)
Potassium: 4.3 mmol/L (ref 3.5–5.1)
Sodium: 135 mmol/L (ref 135–145)

## 2021-04-23 LAB — AEROBIC/ANAEROBIC CULTURE W GRAM STAIN (SURGICAL/DEEP WOUND)

## 2021-04-23 LAB — CBC
HCT: 23.3 % — ABNORMAL LOW (ref 39.0–52.0)
Hemoglobin: 7.5 g/dL — ABNORMAL LOW (ref 13.0–17.0)
MCH: 28.1 pg (ref 26.0–34.0)
MCHC: 32.2 g/dL (ref 30.0–36.0)
MCV: 87.3 fL (ref 80.0–100.0)
Platelets: 391 10*3/uL (ref 150–400)
RBC: 2.67 MIL/uL — ABNORMAL LOW (ref 4.22–5.81)
RDW: 17 % — ABNORMAL HIGH (ref 11.5–15.5)
WBC: 18.2 10*3/uL — ABNORMAL HIGH (ref 4.0–10.5)
nRBC: 0.2 % (ref 0.0–0.2)

## 2021-04-23 LAB — HEPARIN LEVEL (UNFRACTIONATED): Heparin Unfractionated: 0.54 IU/mL (ref 0.30–0.70)

## 2021-04-23 MED ORDER — SODIUM CHLORIDE 0.9% IV SOLUTION: Freq: Once | INTRAVENOUS | Status: AC

## 2021-04-23 NOTE — Progress Notes (Signed)
Progress Note   Patient: Scott Foley BUL:[id number] DOB: 07/22/1965 DOA: 04/13/2021     10 DOS: the patient was seen and examined on 04/23/2021   Brief hospital course: 56 y.o. male with medical history significant of hx of CAD, HTN, hx of PE on eliquis, HLD, hxo of CVA in 2014, IDA with history of recent hospitalizaion on 03/16/21 for recurrent pancreatitis, sepsis due to bacteremia from klebsiella pneumoniae, covid-19 and respiratory failure requiring home oxygen on discharge.  Presented with a 4-day history of diffuse worsening abdominal pain.  He has had abdominal issues for the past 2 months.  He had a cholecystectomy on 12/21 and underwent ERCP on 12/22 since the intraoperative cholangiogram showed a CBD stone.  After ERCP he developed pancreatitis.  He was managed conservatively and then discharged home on 12/28.  He was readmitted to [facility] on 1/4 for recurrent pancreatitis and COVID-19.  Noted to have sepsis respiratory failure.  Blood cultures were positive for Klebsiella pneumonia.  He was treated with Invanz for 10 days.  Discharged on 2 L of oxygen. He had a postop visit with surgeon on 04/12/21 when he complained of abdominal pain.  A CT scan was subsequently done which showed pancreatic necrosis with gas within the pancreatic fluid collections.  He was sent over to [facility] emergency department.  He was seen by general surgery.  Hospitalized for further management.  Placed on antibiotics.  Also noted to have acute kidney injury.  Denied any significant alcohol use. Patient is seen imaging and surgery as well as gastroenterology.  Interventional radiology was consulted for aspiration of the fluid collection which was performed on 2/6.  Assessment and Plan: * Necrotizing pancreatitis- (present on admission) This is either a third episode of pancreatitis or sequelae of pancreatitis from his episode in January.  His lipase level was noted to be normal.  He does have  significant leukocytosis.  CT scan was done at [facility].  Do not have access to that report but according to the H&P it showed progressive severe pancreatitis with extensive fluid collection surrounding the pancreas and extending into the right retroperitoneum.  Many of these fluid collections containing gas concerning for infection.  Compression of splenic vein and SMV was noted although they appear to be patent.  Patient was started on meropenem.   Gastroenterology and general surgery have been consulted.  No role for endoscopic drainage at this time.  Interventional radiology consulted for percutaneous drainage which was done on 2/6  Patient is stable.  Pain is reasonably well controlled.  He is tolerating his tube feedings. -clears started on 2/7 -will advance diet to low fat and see about changing tube feeds to noctornal  Protein-calorie malnutrition, severe Started on tube feedings which he is tolerating well- transition to nocturnal tube feeds.  Continue to monitor. Nutrition Status: Nutrition Problem: Severe Malnutrition Etiology: acute illness (pancreatitis) Signs/Symptoms: moderate muscle depletion, moderate fat depletion, energy intake < or equal to 50% for > or equal to 5 days, percent weight loss (11% weight loss within 3 months) Percent weight loss: 11 % Interventions: Tube feeding    Hypoglycemia Resolved with initiation of fluids and nutrition.  Normocytic anemia- (present on admission) Significant drop in hemoglobin noted most of which appears to be dilutional.   Anemia panel reviewed.  Vitamin B12 noted to be low normal at 254.  Being supplemented.  Folate 8.2.  Ferritin 1033.  TIBC 137.  Iron 16. Hemoglobin low but stable.  No overt  bleeding identified -will transfuse 1 unit PRBC for symptomatic anemia  Chronic respiratory failure with hypoxia ([place])- (present on admission) Recently recovered from COVID-19 infection.  Was discharged on 2 L of oxygen by nasal cannula.   Chest x-ray continues to show opacities which could be from his recent infection rather than any infection.   Respiratory status is stable.  Hypokalemia- (present on admission) Hypomagnesemia -replete  AKI (acute kidney injury) ([place])- (present on admission) Presented with creatinine of 2.49. This was likely prerenal in the setting of sepsis along with nausea and vomiting over the past few days.   Patient aggressively hydrated.  Renal function has improved and back to normal now.  Monitor urine output.    Sepsis ([place])- (present on admission) On admission he met sepsis criteria with tachycardia, fever to 100.8, WBC to 18.2.  Recently noted to have Klebsiella bacteremia earlier in January when he was hospitalized in [place].  Blood cultures done here in the hospital have been negative so far. Sepsis physiology appears to have improved.  WBC is better.  Remains afebrile.  Follow-up on cultures from fluid sent after aspiration 2/6- kleb/citrobacter -ID consult for duration of abx  History of pulmonary embolism- (present on admission) Eliquis is currently on hold.  He is on heparin infusion.    Essential hypertension- (present on admission) Sinus tachycardia  ACE inhibitor on hold.  Borderline low blood pressures noted.   Sinus tachycardia likely secondary to severe illness. He was started back on beta-blocker due to concern for rebound tachycardia.  Heart rate is better.  Chronic obstructive pulmonary disease ([place])- (present on admission) No signs of exacerbation. Continue trelegy daily and albuterol prn.  Hyperlipemia- (present on admission) Statin on hold currently.  Triglyceride was 121 in December.  Coronary artery disease with stable angina pectoris ([place])- (present on admission) Stable.  Plavix currently on hold.       Subjective: ate a low fat diet well  Physical Exam: Vitals:   04/23/21 0801 04/23/21 1056 04/23/21 1112 04/23/21 1115  BP:  123/85 118/86 118/86  Pulse:   93 98 98  Resp:  _0 Temp:  98.2 F (36.8 C) 98 F (36.7 C) 98 F (36.7 C)  TempSrc:  Oral  Oral  SpO2: 94% 97%  98%  Weight:      Height:        General: Appearance:    Obese male in no acute distress, NG tube in place     Lungs:     respirations unlabored  Heart:    Normal heart rate.    MS:   All extremities are intact.    Neurologic:   Awake, alert     Data Reviewed: WBC still elevated Hgb stable but low  Family Communication: wife at bedside  Disposition: Status is: Inpatient  Remains inpatient appropriate because: needs IV abx and tube feeds    Planned Discharge Destination: Home  Time spent: 45 minutes  Author: Geradine Girt, DO 04/23/2021 1:20 PM  For on call review [email or web address].

## 2021-04-23 NOTE — Progress Notes (Signed)
ANTICOAGULATION CONSULT NOTE - Follow Up Consult  Pharmacy Consult for heparin Indication:  h/o PE  Labs: Recent Labs    04/21/21 0323 04/22/21 0335 04/23/21 0104  HGB 7.7* 7.4* 7.5*  HCT 23.8* 23.4* 23.3*  PLT 417* 391 391  HEPARINUNFRC 0.46 0.47 0.54  CREATININE 0.90 0.82 0.79     Assessment: 55yo male on heparin with initial dosing while Eliquis on hold.  Heparin level 0.54  Goal of Therapy:  Heparin 0.3-0.7 units/ml   Plan:  Continue heparin at 2400 units/hr Daily heparin level - next 2/12 Monitor CBC and s/sx of bleeding daily F/u with apixaban resume- continue to hold 2/11  Thank you Zettie Pho Lottie Siska BS, PharmD, BCPS Clinical Pharmacist 04/23/2021, 7:41 AM

## 2021-04-23 NOTE — Progress Notes (Signed)
Mobility Specialist: Progress Note   04/23/21 1137  Mobility  Activity Ambulated with assistance in hallway  Level of Assistance Standby assist, set-up cues, supervision of patient - no hands on  Assistive Device Cane  Distance Ambulated (ft) 160 ft  Activity Response Tolerated well  $Mobility charge 1 Mobility   Pt received in recliner and agreeable to mobility. C/o general fatigue during ambulation, otherwise asymptomatic. Pt back to recliner after walk with call bell in reach. Family present in the room.   [facility] Kaeley Vinje Mobility Specialist Mobility Specialist 5 North: [phone] Mobility Specialist 6 North: [phone]

## 2021-04-23 NOTE — Plan of Care (Signed)
  Problem: Activity: Goal: Risk for activity intolerance will decrease Outcome: Progressing   Problem: Coping: Goal: Level of anxiety will decrease Outcome: Progressing   Problem: Elimination: Goal: Will not experience complications related to bowel motility Outcome: Progressing   

## 2021-04-23 NOTE — Progress Notes (Addendum)
[facility] Progress Note  CC:  Necrotizing pancreatitis   Subjective: He is tolerating solid food during the day and tube feedings at nighttime.  No nausea or vomiting.  He continues to have moderate right mid to right lower abdominal pain.  No chest pain or shortness of breath.  He passed 2 loose mud like brown stools yesterday.  No rectal bleeding or black stools.  No BM today.  He sat up in the chair earlier today.  No family at the bedside.   Objective:  Vital signs in last 24 hours: Temp:  [97.4 F (36.3 C)-98.2 F (36.8 C)] 97.5 F (36.4 C) (02/11 1334) Pulse Rate:  [93-109] 96 (02/11 1334) Resp:  [16-20] 19 (02/11 1334) BP: (117-130)/(74-86) 127/84 (02/11 1334) SpO2:  [93 %-98 %] 98 % (02/11 1334) Last BM Date: 04/22/21 General: 56 year old male alert in no acute distress. Heart: Regular rate and rhythm, no murmurs. Pulm: Few fine crackles in the left lower base otherwise clear. Abdomen: Soft, mildly distended.  Mild tenderness to the right mid to lower abdomen in the surrounding perc drain site, drain bag with approximately 20 to 30 cc of milky beige drainage. Extremities: Bilateral lower extremities with trace edema.  Feeding tube in place, currently clamped. Neurologic:  Alert and  oriented x 4. Grossly normal neurologically. Psych:  Alert and cooperative. Normal mood and affect.  Intake/Output from previous day: 02/10 0701 - 02/11 0700 In: 3235.9 [P.O.:360; I.V.:1553; NG/GT:907.9; IV Piggyback:400] Out: 2045 [Urine:2000; Drains:45] Intake/Output this shift: No intake/output data recorded. PERC drain with 45 cc of drainage over the past 24 hours.  Lab Results: Recent Labs    04/21/21 0323 04/22/21 0335 04/23/21 0104  WBC 17.6* 15.5* 18.2*  HGB 7.7* 7.4* 7.5*  HCT 23.8* 23.4* 23.3*  PLT 417* 391 391   BMET Recent Labs    04/21/21 0323 04/22/21 0335 04/23/21 0104  NA 137 135 135  K 3.8 3.9 4.3  CL 102 102 101  CO2 27 25 25   GLUCOSE  115* 130* 110*  BUN 16 16 18   CREATININE 0.90 0.82 0.79  CALCIUM 7.8* 8.0* 8.2*   LFT No results for input(s): PROT, ALBUMIN, AST, ALT, ALKPHOS, BILITOT, BILIDIR, IBILI in the last 72 hours. PT/INR No results for input(s): LABPROT, INR in the last 72 hours. Hepatitis Panel No results for input(s): HEPBSAG, HCVAB, HEPAIGM, HEPBIGM in the last 72 hours.  No results found.  Assessment / Plan: 1) Post ERCP pancreatitis 03/03/2021, subsequently developed necrotizing pancreatitis with klebseilla and citrobacter isolated from abscess fluid. S/Pperc drain per IR on 04/18/2021. WBC 15.5 -> 18.2.  Afebrile.  On Meropenum per ID, day # 11. Tolerating solid foods during the day and tube feedings throughout the night. -Continue oral diet as tolerated in tube feedings -Pain management per the hospitalist -Continue Meropenem per ID -Further recommendations per Dr. Candis Schatz  2) Anemia. Hg 7.4 -> 7.5.  No overt GI bleeding. -CBC in a.m. -Transfuse for hemoglobin less than 7  3) History of CAD  4) History of PE on heparin infusion.  Principal Problem:   Necrotizing pancreatitis Active Problems:   Coronary artery disease with stable angina pectoris (HCC)   Hyperlipemia   Chronic obstructive pulmonary disease (HCC)   Essential hypertension   History of pulmonary embolism   Sepsis ([place])   AKI (acute kidney injury) ([place])   Hypokalemia   Chronic respiratory failure with hypoxia (HCC)   Normocytic anemia   Hypoglycemia   Protein-calorie malnutrition, severe  LOS: 10 days   Noralyn Pick  04/23/2021, 2:13 PM  I have reviewed the chart and discussed the patient with NP Berniece Pap.  I agree with the APP's note, impression and recommendations.  No changes in the patient's clinical status, continuing to slowly improve.  No new recommendations from GI today.  Appreciate excellent care provided by hospitalist team, ID and surgery.  We will continue to follow the patient  peripherally.  Cleotis Sparr E. Candis Schatz, MD [facility]

## 2021-04-23 NOTE — Plan of Care (Signed)

## 2021-04-24 DIAGNOSIS — K8591 Acute pancreatitis with uninfected necrosis, unspecified: Secondary | ICD-10-CM | POA: Diagnosis not present

## 2021-04-24 LAB — CBC
HCT: 25.4 % — ABNORMAL LOW (ref 39.0–52.0)
Hemoglobin: 8 g/dL — ABNORMAL LOW (ref 13.0–17.0)
MCH: 27.4 pg (ref 26.0–34.0)
MCHC: 31.5 g/dL (ref 30.0–36.0)
MCV: 87 fL (ref 80.0–100.0)
Platelets: 380 10*3/uL (ref 150–400)
RBC: 2.92 MIL/uL — ABNORMAL LOW (ref 4.22–5.81)
RDW: 17.8 % — ABNORMAL HIGH (ref 11.5–15.5)
WBC: 16.5 10*3/uL — ABNORMAL HIGH (ref 4.0–10.5)
nRBC: 0.1 % (ref 0.0–0.2)

## 2021-04-24 LAB — GLUCOSE, CAPILLARY
Glucose-Capillary: 106 mg/dL — ABNORMAL HIGH (ref 70–99)
Glucose-Capillary: 109 mg/dL — ABNORMAL HIGH (ref 70–99)
Glucose-Capillary: 128 mg/dL — ABNORMAL HIGH (ref 70–99)
Glucose-Capillary: 91 mg/dL (ref 70–99)
Glucose-Capillary: 93 mg/dL (ref 70–99)
Glucose-Capillary: 95 mg/dL (ref 70–99)

## 2021-04-24 LAB — BASIC METABOLIC PANEL
Anion gap: 10 (ref 5–15)
BUN: 20 mg/dL (ref 6–20)
CO2: 24 mmol/L (ref 22–32)
Calcium: 8.4 mg/dL — ABNORMAL LOW (ref 8.9–10.3)
Chloride: 101 mmol/L (ref 98–111)
Creatinine, Ser: 0.97 mg/dL (ref 0.61–1.24)
GFR, Estimated: >60 mL/min (ref >60)
Glucose, Bld: 103 mg/dL — ABNORMAL HIGH (ref 70–99)
Potassium: 4.1 mmol/L (ref 3.5–5.1)
Sodium: 135 mmol/L (ref 135–145)

## 2021-04-24 LAB — TYPE AND SCREEN
ABO/RH(D): A POS
Antibody Screen: NEGATIVE
Unit division: 0

## 2021-04-24 LAB — BPAM RBC
Blood Product Expiration Date: 202303022359
ISSUE DATE / TIME: 202302111051
Unit Type and Rh: 6200

## 2021-04-24 LAB — HEPARIN LEVEL (UNFRACTIONATED): Heparin Unfractionated: 0.42 IU/mL (ref 0.30–0.70)

## 2021-04-24 NOTE — Plan of Care (Signed)

## 2021-04-24 NOTE — Progress Notes (Signed)
ANTICOAGULATION CONSULT NOTE - Follow Up Consult  Pharmacy Consult for heparin Indication:  h/o PE  Labs: Recent Labs    04/22/21 0335 04/23/21 0104 04/24/21 0320  HGB 7.4* 7.5* 8.0*  HCT 23.4* 23.3* 25.4*  PLT 391 391 380  HEPARINUNFRC 0.47 0.54 0.42  CREATININE 0.82 0.79 0.97     Assessment: 56yo male on heparin with initial dosing while Eliquis on hold.  Heparin level 0.42  Goal of Therapy:  Heparin 0.3-0.7 units/ml   Plan:  Continue heparin at 2400 units/hr Daily heparin level - next 2/13 Monitor CBC and s/sx of bleeding daily F/u with apixaban resume- continue to hold 2/12  Thank you Zettie Pho Salem Mastrogiovanni BS, PharmD, BCPS Clinical Pharmacist 04/24/2021, 7:37 AM

## 2021-04-24 NOTE — Progress Notes (Signed)
Progress Note   Patient: Scott Foley FKC:[id number] DOB: October 29, 1965 DOA: 04/13/2021     11 DOS: the patient was seen and examined on 04/24/2021   Brief hospital course: 56 y.o. male with medical history significant of hx of CAD, HTN, hx of PE on eliquis, HLD, hxo of CVA in 2014, IDA with history of recent hospitalizaion on 03/16/21 for recurrent pancreatitis, sepsis due to bacteremia from klebsiella pneumoniae, covid-19 and respiratory failure requiring home oxygen on discharge.  Presented with a 4-day history of diffuse worsening abdominal pain.  He has had abdominal issues for the past 2 months.  He had a cholecystectomy on 12/21 and underwent ERCP on 12/22 since the intraoperative cholangiogram showed a CBD stone.  After ERCP he developed pancreatitis.  He was managed conservatively and then discharged home on 12/28.  He was readmitted to [facility] on 1/4 for recurrent pancreatitis and COVID-19.  Noted to have sepsis respiratory failure.  Blood cultures were positive for Klebsiella pneumonia.  He was treated with Invanz for 10 days.  Discharged on 2 L of oxygen. He had a postop visit with surgeon on 04/12/21 when he complained of abdominal pain.  A CT scan was subsequently done which showed pancreatic necrosis with gas within the pancreatic fluid collections.  He was sent over to [facility] emergency department.  He was seen by general surgery.  Hospitalized for further management.  Placed on antibiotics.  Also noted to have acute kidney injury.  Denied any significant alcohol use. Patient is seen imaging and surgery as well as gastroenterology.  Interventional radiology was consulted for aspiration of the fluid collection which was performed on 2/6.  Assessment and Plan: * Necrotizing pancreatitis- (present on admission) This is either a third episode of pancreatitis or sequelae of pancreatitis from his episode in January.  His lipase level was noted to be normal.  He does have  significant leukocytosis.  CT scan was done at [facility].  Do not have access to that report but according to the H&P it showed progressive severe pancreatitis with extensive fluid collection surrounding the pancreas and extending into the right retroperitoneum.  Many of these fluid collections containing gas concerning for infection.  Compression of splenic vein and SMV was noted although they appear to be patent.  Patient was started on meropenem.   Gastroenterology and general surgery have been consulted.  No role for endoscopic drainage at this time.  Interventional radiology consulted for percutaneous drainage which was done on 2/6  Patient is stable.  Pain is reasonably well controlled.  He is tolerating his tube feedings. -clears started on 2/7 -will advance diet to low fat and see about changing tube feeds to noctornal -calorie count ordered-- patient thinks he is currently eating about 25%  Protein-calorie malnutrition, severe Started on tube feedings which he is tolerating well- transition to nocturnal tube feeds.  Continue to monitor. Nutrition Status: Nutrition Problem: Severe Malnutrition Etiology: acute illness (pancreatitis) Signs/Symptoms: moderate muscle depletion, moderate fat depletion, energy intake < or equal to 50% for > or equal to 5 days, percent weight loss (11% weight loss within 3 months) Percent weight loss: 11 % Interventions: Tube feeding    Hypoglycemia Resolved with initiation of fluids and nutrition.  Normocytic anemia- (present on admission) Significant drop in hemoglobin noted most of which appears to be dilutional.   Anemia panel reviewed.  Vitamin B12 noted to be low normal at 254.  Being supplemented.  Folate 8.2.  Ferritin 1033.  TIBC 137.  Iron 16. Hemoglobin low but stable.  No overt bleeding identified -will transfuse 1 unit PRBC for symptomatic anemia- minimal improvement in symptoms  Chronic respiratory failure with hypoxia ([place])- (present on  admission) Recently recovered from COVID-19 infection.  Was discharged on 2 L of oxygen by nasal cannula.  Chest x-ray continues to show opacities which could be from his recent infection rather than any infection.   Respiratory status is stable.  Hypokalemia- (present on admission) Hypomagnesemia -replete  AKI (acute kidney injury) ([place])- (present on admission) Presented with creatinine of 2.49. This was likely prerenal in the setting of sepsis along with nausea and vomiting over the past few days.   Patient aggressively hydrated.  Renal function has improved and back to normal now.  Monitor urine output.    Sepsis ([place])- (present on admission) On admission he met sepsis criteria with tachycardia, fever to 100.8, WBC to 18.2.  Recently noted to have Klebsiella bacteremia earlier in January when he was hospitalized in [place].  Blood cultures done here in the hospital have been negative so far. Sepsis physiology appears to have improved.  WBC is stable  Remains afebrile.  Follow-up on cultures from fluid sent after aspiration 2/6- kleb/citrobacter -ID consult for duration of abx  History of pulmonary embolism- (present on admission) Eliquis is currently on hold.  He is on heparin infusion.    Essential hypertension- (present on admission) Sinus tachycardia  ACE inhibitor on hold.  Borderline low blood pressures noted.   Sinus tachycardia likely secondary to severe illness. He was started back on beta-blocker due to concern for rebound tachycardia.  Heart rate is better.  Chronic obstructive pulmonary disease ([place])- (present on admission) No signs of exacerbation. Continue trelegy daily and albuterol prn.  Hyperlipemia- (present on admission) Statin on hold currently.  Triglyceride was 121 in December.  Coronary artery disease with stable angina pectoris ([place])- (present on admission) Stable.  Plavix currently on hold.     Subjective: eating about 25% of trays per  patient  Physical Exam: Vitals:   04/24/21 0500 04/24/21 0804 04/24/21 0809 04/24/21 1146  BP:  135/90  123/83  Pulse:  93  89  Resp:  (!) 24  (!) 24  Temp:  97.9 F (36.6 C)  97.8 F (36.6 C)  TempSrc:  Oral  Oral  SpO2:  94% 93% 92%  Weight: 94 kg     Height:        General: Appearance:    Obese male in no acute distress     Lungs:     respirations unlabored  Heart:    Normal heart rate.    MS:   All extremities are intact.    Neurologic:   Awake, alert, oriented x 3     Data Reviewed: Wbc 16.5 Hgb 8  Family Communication: wife at bedside 2/11  Disposition: Status is: Inpatient  Remains inpatient appropriate because: on tube feeds    Planned Discharge Destination: Home   Time spent: 45 minutes  Author: Geradine Girt, DO 04/24/2021 12:05 PM  For on call review [email or web address].

## 2021-04-24 NOTE — Progress Notes (Signed)
Mobility Specialist: Progress Note   04/24/21 1608  Mobility  Activity Ambulated with assistance in hallway  Level of Assistance Standby assist, set-up cues, supervision of patient - no hands on  Assistive Device Front wheel walker  Distance Ambulated (ft) 240 ft  Activity Response Tolerated well  $Mobility charge 1 Mobility   Pre-Mobility: 101 HR, 94% SpO2 Post-Mobility: 121 HR, 93% SpO2  Received pt in bed having no complaints and agreeable to mobility. Asymptomatic throughout ambulation, returned back to bed w/ call bell in reach and all needs met.  [facility] Mobility Specialist Mobility Specialist 5 North: [phone] Mobility Specialist 6 North: [phone]

## 2021-04-25 ENCOUNTER — Ambulatory Visit: Payer: Medicare HMO | Admitting: Oncology

## 2021-04-25 ENCOUNTER — Other Ambulatory Visit: Payer: Medicare HMO

## 2021-04-25 DIAGNOSIS — K859 Acute pancreatitis without necrosis or infection, unspecified: Secondary | ICD-10-CM | POA: Diagnosis not present

## 2021-04-25 DIAGNOSIS — K8591 Acute pancreatitis with uninfected necrosis, unspecified: Secondary | ICD-10-CM | POA: Diagnosis not present

## 2021-04-25 LAB — GLUCOSE, CAPILLARY
Glucose-Capillary: 116 mg/dL — ABNORMAL HIGH (ref 70–99)
Glucose-Capillary: 94 mg/dL (ref 70–99)
Glucose-Capillary: 94 mg/dL (ref 70–99)
Glucose-Capillary: 95 mg/dL (ref 70–99)
Glucose-Capillary: 97 mg/dL (ref 70–99)

## 2021-04-25 LAB — C-REACTIVE PROTEIN: CRP: 12.9 mg/dL — ABNORMAL HIGH (ref <1.0)

## 2021-04-25 LAB — HEPARIN LEVEL (UNFRACTIONATED): Heparin Unfractionated: 0.51 IU/mL (ref 0.30–0.70)

## 2021-04-25 MED ORDER — METOPROLOL TARTRATE 50 MG PO TABS
50.0000 mg | ORAL_TABLET | Freq: Two times a day (BID) | ORAL | Status: DC
  Administered 2021-04-25 – 2021-04-30 (×11): 50 mg via ORAL
  Filled 2021-04-25 (×12): qty 1

## 2021-04-25 NOTE — Progress Notes (Signed)
Referring Physician(s): Dr. [place] Pink  Supervising Physician: Markus Daft  Patient Status:  Scott Foley - In-pt  Chief Complaint: Pancreatic pseudocyst s/p RLQ drain placement in IR 04/18/21  Subjective: Patient sitting up in bed watching TV. He denies any pain or discomfort.   Allergies: Penicillin g  Medications: Prior to Admission medications   Medication Sig Start Date End Date Taking? Authorizing Provider  albuterol (VENTOLIN HFA) 108 (90 Base) MCG/ACT inhaler Inhale 2 puffs into the lungs every 6 (six) hours as needed for wheezing or shortness of breath. 04/04/21  Yes Rip Harbour, NP  apixaban (ELIQUIS) 5 MG TABS tablet Take 1 tablet (5 mg total) by mouth 2 (two) times daily. 04/01/21  Yes Rip Harbour, NP  clopidogrel (PLAVIX) 75 MG tablet Take 1 tablet (75 mg total) by mouth daily. 04/01/21  Yes Rip Harbour, NP  Evolocumab (REPATHA) 140 MG/ML SOSY Inject 140 mg into the skin every 14 (fourteen) days. 12/30/20  Yes [name], [name], MD  Fluticasone-Umeclidin-Vilant (TRELEGY ELLIPTA) 100-62.5-25 MCG/ACT AEPB Inhale 1 puff into the lungs daily. 04/04/21  Yes Rip Harbour, NP  Ibuprofen-diphenhydrAMINE Cit (ADVIL PM PO) Take 1 tablet by mouth at bedtime.   Yes [provider]  isosorbide mononitrate (IMDUR) 60 MG 24 hr tablet Take 1 tablet (60 mg total) by mouth daily. 04/01/21  Yes Rip Harbour, NP  lisinopril (ZESTRIL) 40 MG tablet Take 1 tablet (40 mg total) by mouth daily. 04/01/21  Yes Rip Harbour, NP  metoprolol tartrate (LOPRESSOR) 50 MG tablet Take 1 tablet (50 mg total) by mouth 2 (two) times daily. 04/01/21  Yes Rip Harbour, NP  Omega-3 Fatty Acids (FISH OIL) 1000 MG CAPS Take 1 capsule (1,000 mg total) by mouth 2 (two) times daily. 11/30/20  Yes [name], [name], MD  pantoprazole (PROTONIX) 40 MG tablet Take 1 tablet (40 mg total) by mouth daily. Patient taking differently: Take 40 mg by mouth 2 (two) times daily. 02/15/21  Yes Rip Harbour, NP   rosuvastatin (CRESTOR) 40 MG tablet Take 1 tablet (40 mg total) by mouth daily. Patient taking differently: Take 40 mg by mouth at bedtime. 04/01/21  Yes Rip Harbour, NP     Vital Signs: BP (!) 151/96 (BP Location: Right Arm)    Pulse (!) 103    Temp 97.8 F (36.6 C) (Oral)    Resp (!) 21    Ht 5\' 4"  (1.626 m)    Wt 207 lb 3.7 oz (94 kg)    SpO2 95%    BMI 35.57 kg/m   Physical Exam Constitutional:      General: He is not in acute distress. Pulmonary:     Effort: Pulmonary effort is normal.  Abdominal:     Palpations: Abdomen is soft.     Tenderness: There is no abdominal tenderness.     Comments: RLQ drain with approximately 25 ml of tan output. Dressing is clean and dry. Suture and stat-lock intact. Drain easily flushed with 10 ml NS.   Skin:    General: Skin is warm and dry.  Neurological:     Mental Status: He is alert and oriented to person, place, and time.    Imaging: No results found.  Labs:  CBC: Recent Labs    04/21/21 0323 04/22/21 0335 04/23/21 0104 04/24/21 0320  WBC 17.6* 15.5* 18.2* 16.5*  HGB 7.7* 7.4* 7.5* 8.0*  HCT 23.8* 23.4* 23.3* 25.4*  PLT 417* 391 391 380    COAGS:  Recent Labs    04/13/21 1523 04/14/21 0230 04/15/21 0153 04/15/21 0909 04/15/21 2008 04/16/21 0649 04/17/21 0341  INR 2.4*  --  1.6*  --   --  1.4* 1.5*  APTT 52*   < >  --  62* 69* 74* 65*   < > = values in this interval not displayed.    BMP: Recent Labs    04/21/21 0323 04/22/21 0335 04/23/21 0104 04/24/21 0320  NA 137 135 135 135  K 3.8 3.9 4.3 4.1  CL 102 102 101 101  CO2 27 25 25 24   GLUCOSE 115* 130* 110* 103*  BUN 16 16 18 20   CALCIUM 7.8* 8.0* 8.2* 8.4*  CREATININE 0.90 0.82 0.79 0.97  GFRNONAA >60 >60 >60 >60    LIVER FUNCTION TESTS: Recent Labs    04/15/21 0153 04/16/21 0649 04/17/21 0637 04/18/21 0251  BILITOT 0.6 0.3 0.2* 0.4  AST 17 18 16 24   ALT 18 17 15 17   ALKPHOS 98 92 67 77  PROT 5.6* 5.8* 5.3* 4.8*  ALBUMIN 1.7* 1.7*  1.5* <1.5*    Assessment and Plan:   Drain Location: RLQ Size: Fr size: 10 Fr Date of placement: 04/18/21 Currently to: Drain collection device: gravity 24 hour output:  Output by Drain (mL) 04/23/21 0701 - 04/23/21 1900 04/23/21 1901 - 04/24/21 0700 04/24/21 0701 - 04/24/21 1900 04/24/21 1901 - 04/25/21 0700 04/25/21 0701 - 04/25/21 1406  Closed System Drain 1 Right RLQ Other (Comment) 12 Fr.  20  25 25  ml in bag     Interval imaging/drain manipulation: None  Current examination: Flushes/aspirates easily.  Insertion site unremarkable. Suture and stat lock in place. Dressed appropriately.   Plan: Continue TID flushes with 5 cc NS. Record output Q shift. Dressing changes QD or PRN if soiled.  Call IR APP or on call IR MD if difficulty flushing or sudden change in drain output.  Repeat imaging/possible drain injection once output < 10 mL/QD (excluding flush material.)  Discharge planning: Please contact IR APP or on call IR MD prior to patient d/c to ensure appropriate follow up plans are in place. Typically patient will follow up with IR clinic 10-14 days post d/c for repeat imaging/possible drain injection. IR scheduler will contact patient with date/time of appointment. Patient will need to flush drain QD with 5 cc NS, record output QD, dressing changes every 2-3 days or earlier if soiled.   IR will continue to follow - please call with questions or concerns.  Electronically Signed: Soyla Dryer, AGACNP-BC [phone] 04/25/2021, 2:01 PM   I spent a total of 15 Minutes at the the patient's bedside AND on the patient's hospital floor or unit, greater than 50% of which was counseling/coordinating care for pancreatic pseudocyst drain

## 2021-04-25 NOTE — Progress Notes (Signed)
Nutrition Follow-up  DOCUMENTATION CODES:  Severe malnutrition in context of acute illness/injury  INTERVENTION:  Begin 48 hour calorie count.  Continue Nocturnal TF via Cortrak: -Vital 1.5 @ 44ml/hr x 12 hours (89ml) to be administered from 1800-0600 -86ml Prosource TF BID -62ml free water Q4H  Nocturnal TF regimen provides 1340 kcals, 78 grams protein, 862ml total free water  (Meets 58% and 65% estimated calorie and protein needs, respectively)  -Ensure Enlive po BID, each supplement provides 350 kcal and 20 grams of protein.  NUTRITION DIAGNOSIS:  Severe Malnutrition related to acute illness (pancreatitis) as evidenced by moderate muscle depletion, moderate fat depletion, energy intake < or equal to 50% for > or equal to 5 days, percent weight loss (11% weight loss within 3 months). - ongoing  GOAL:  Patient will meet greater than or equal to 90% of their needs - progressing, addressing with TF and supplements  MONITOR:  I & O's, Labs, Diet advancement  REASON FOR ASSESSMENT:  New TF    ASSESSMENT:  56 yo male admitted with severe acute pancreatitis with sepsis, AKI. PMH includes CAD, HTN, PE, HLD, CVA, IDA, pancreatitis, chronic respiratory failure. 2/03 - Cortrak placed then advanced by fluoroscopy into the distal duodenum/proximal jejunum 2/06 - IR consulted for percutaneous drainage 2/07 - diet advanced to full liquids  2/10 - diet advanced to Heart Healthy  Will monitor for adequacy of PO intake while administering nocturnal TF. Cortrak should not be removed until pt consistently meeting >75% estimated needs PO.   Since pt was seen on Friday, 2/10, only two more documented meals from yesterday - 50% of breakfast and 50% of lunch.  RD consulted to start calorie count. RD placed calorie count envelope and instructions on pt's door.  Admit wt: 84.8 kg Current wt: 94 kg  Of note, pt with mild BLE edema.  Supplements: Ensure BID  Medications: reviewed; Protonix  BID, Vitamin B12, oxycodone PO PRN (given once today)  Labs: reviewed; CBG 94-116   Diet Order:   Diet Order             Diet Heart Room service appropriate? Yes; Fluid consistency: Thin  Diet effective now                  EDUCATION NEEDS:  Not appropriate for education at this time  Skin:  Skin Assessment: Reviewed RN Assessment  Last BM:  04/22/21  Height:  Ht Readings from Last 1 Encounters:  04/19/21 5\' 4"  (1.626 m)   Weight:  Wt Readings from Last 1 Encounters:  04/25/21 94 kg   BMI:  Body mass index is 35.57 kg/m.  Estimated Nutritional Needs:  Kcal:  2300-2500 Protein:  120 gm Fluid:  2.3-2.5 L  Derrel Nip, RD, LDN (she/her/hers) Clinical Inpatient Dietitian RD Pager/After-Hours/Weekend Pager # in [place]

## 2021-04-25 NOTE — Progress Notes (Signed)
Mobility Specialist: Progress Note   04/25/21 1200  Mobility  Bed Position Chair  Activity Ambulated with assistance in hallway  Level of Assistance Modified independent, requires aide device or extra time  Assistive Device [facility] Ambulated (ft) 275 ft  Activity Response Tolerated well  $Mobility charge 1 Mobility   Received pt in chair having no complaints and agreeable to mobility. Asymptomatic throughout ambulation, returned back to chair w/ call bell in reach and all needs met.  [facility] Ishmael Berkovich Mobility Specialist Mobility Specialist 5 North: [phone] Mobility Specialist 6 North: [phone]

## 2021-04-25 NOTE — Progress Notes (Addendum)
Patient ID: Scott Foley, male   DOB: Apr 10, 1965, 56 y.o.   MRN: [id number]     Attending physician's note   I have taken a history, reviewed the chart, and examined the patient. I performed a substantive portion of this encounter, including complete performance of at least one of the key components, in conjunction with the APP. I agree with the APP's note, impression, and recommendations with my edits.   -Agree with plan for repeat CT either Wednesday or Thursday this week to evaluate for interval change after IR drainage and ABX. - Continue ABX per primary Hospitalist service and consulting ID service - GI service will continue to follow periodically  Gerrit Heck, DO, FACG [phone] office          Progress Note   Subjective   Day # 12  CC; necrotizing pancreatitis post ERCP 03/03/2021 Meropenem day 11 Status post percutaneous drain per IR 04/18/2021--purulent creamy material in bag Nocturnal tube feeds  Labs yesterday WBC 16.5/hemoglobin 8 Creatinine 0.97  Patient is sitting up in the chair, continues to do okay with solid food during the day, no significant increase in pain with oral intake   Objective   Vital signs in last 24 hours: Temp:  [97.7 F (36.5 C)-97.8 F (36.6 C)] 97.8 F (36.6 C) (02/13 0727) Pulse Rate:  [89-111] 103 (02/13 0727) Resp:  [18-24] 21 (02/13 0727) BP: (123-151)/(83-96) 151/96 (02/13 0727) SpO2:  [92 %-95 %] 95 % (02/13 0815) Weight:  [94 kg] 94 kg (02/13 0500) Last BM Date: 04/22/21 General:    white male n NAD Heart:  Regular rate and rhythm; no murmurs Lungs: Respirations even and unlabored, lungs CTA bilaterally Abdomen: Full feeling, bowel sounds are present, mild diffuse tenderness, drain in the right lower quadrant  normal bowel sounds. Extremities:  Without edema. Neurologic:  Alert and oriented,  grossly normal neurologically. Psych:  Cooperative. Normal mood and affect.  Intake/Output from previous day: 02/12  0701 - 02/13 0700 In: 5773.7 [P.O.:200; I.V.:1125.5; YW/VP:7106.2; IV Piggyback:300] Out: 1125 [Urine:1100; Drains:25] Intake/Output this shift: No intake/output data recorded.  Lab Results: Recent Labs    04/23/21 0104 04/24/21 0320  WBC 18.2* 16.5*  HGB 7.5* 8.0*  HCT 23.3* 25.4*  PLT 391 380   BMET Recent Labs    04/23/21 0104 04/24/21 0320  NA 135 135  K 4.3 4.1  CL 101 101  CO2 25 24  GLUCOSE 110* 103*  BUN 18 20  CREATININE 0.79 0.97  CALCIUM 8.2* 8.4*   LFT No results for input(s): PROT, ALBUMIN, AST, ALT, ALKPHOS, BILITOT, BILIDIR, IBILI in the last 72 hours. PT/INR No results for input(s): LABPROT, INR in the last 72 hours.  Studies/Results: No results found.     Assessment / Plan:   #29  56 year old male, with severe necrotizing pancreatitis, post ERCP 03/03/2021.  Abscess Cultures grew Klebsiella and Citrobacter Status post percutaneous drain placement 04/18/2021-continues to drain purulent material  On meropenem-ID following IR following  Patient currently tolerating solid food during the day, and on nocturnal tube feedings  #2 anemia stable multifactoral #3 history of coronary artery disease #4 history of PE-on heparin infusion  Plan; continue current regimen Will discuss with team, probably plan for repeat CT later this week      Principal Problem:   Necrotizing pancreatitis Active Problems:   Coronary artery disease with stable angina pectoris ([place])   Hyperlipemia   Chronic obstructive pulmonary disease ([place])   Essential hypertension   History of  pulmonary embolism   Sepsis ([place])   AKI (acute kidney injury) ([place])   Hypokalemia   Chronic respiratory failure with hypoxia (HCC)   Normocytic anemia   Hypoglycemia   Protein-calorie malnutrition, severe     LOS: 12 days   Amy Esterwood PA-C 04/25/2021, 11:25 AM

## 2021-04-25 NOTE — Progress Notes (Signed)
[facility]  Date of Admission:  04/13/2021     Total days of antibiotics 12         ASSESSMENT:  Scott Foley is tolerating the meropenem with no adverse side effects and remains stable. Drain continues with output. Planned for repeat CT scan later this week for interval changes. Continue current dose of meropenem. Remaining medical and supportive care per primary team and Gastroenterology.   PLAN:  Continue meropenem.  Repeat imaging to check for interval changes. Remaining medical and supportive care per primary team.    Principal Problem:   Necrotizing pancreatitis Active Problems:   Coronary artery disease with stable angina pectoris (HCC)   Hyperlipemia   Chronic obstructive pulmonary disease (HCC)   Essential hypertension   History of pulmonary embolism   Sepsis ([place])   AKI (acute kidney injury) ([place])   Hypokalemia   Chronic respiratory failure with hypoxia (HCC)   Normocytic anemia   Hypoglycemia   Protein-calorie malnutrition, severe    feeding supplement  237 mL Oral BID BM   feeding supplement (PROSource TF)  45 mL Per Tube BID   feeding supplement (VITAL 1.5 CAL)  840 mL Per Tube Q24H   fluticasone furoate-vilanterol  1 puff Inhalation Daily   And   umeclidinium bromide  1 puff Inhalation Daily   free water  30 mL Per Tube Q4H   iohexol  100 mL Intravenous Once   metoprolol tartrate  50 mg Oral BID   pantoprazole  40 mg Oral BID   sodium chloride flush  5 mL Intracatheter Q8H   vitamin B-12  1,000 mcg Per Tube Daily    SUBJECTIVE:  Afebrile overnight with no acute events. No new concerns/complaints.   Allergies  Allergen Reactions   Penicillin G Hives     Review of Systems: Review of Systems  Constitutional:  Negative for chills, fever and weight loss.  Respiratory:  Negative for cough, shortness of breath and wheezing.   Cardiovascular:  Negative for chest pain and leg swelling.  Gastrointestinal:  Negative for  abdominal pain, constipation, diarrhea, nausea and vomiting.  Skin:  Negative for rash.     OBJECTIVE: Vitals:   04/25/21 0452 04/25/21 0500 04/25/21 0727 04/25/21 0815  BP: 138/86  (!) 151/96   Pulse: (!) 101  (!) 103   Resp: 18  (!) 21   Temp: 97.8 F (36.6 C)  97.8 F (36.6 C)   TempSrc: Oral  Oral   SpO2: 93%  95% 95%  Weight:  94 kg    Height:       Body mass index is 35.57 kg/m.  Physical Exam Constitutional:      General: He is not in acute distress.    Appearance: He is well-developed.  Cardiovascular:     Rate and Rhythm: Normal rate and regular rhythm.     Heart sounds: Normal heart sounds.  Pulmonary:     Effort: Pulmonary effort is normal.     Breath sounds: Normal breath sounds.  Skin:    General: Skin is warm and dry.  Neurological:     Mental Status: He is alert and oriented to person, place, and time.  Psychiatric:        Behavior: Behavior normal.        Thought Content: Thought content normal.        Judgment: Judgment normal.    Lab Results Lab Results  Component Value Date   WBC 16.5 (H) 04/24/2021  HGB 8.0 (L) 04/24/2021   HCT 25.4 (L) 04/24/2021   MCV 87.0 04/24/2021   PLT 380 04/24/2021    Lab Results  Component Value Date   CREATININE 0.97 04/24/2021   BUN 20 04/24/2021   NA 135 04/24/2021   K 4.1 04/24/2021   CL 101 04/24/2021   CO2 24 04/24/2021    Lab Results  Component Value Date   ALT 17 04/18/2021   AST 24 04/18/2021   ALKPHOS 77 04/18/2021   BILITOT 0.4 04/18/2021     Microbiology: Recent Results (from the past 240 hour(s))  Aerobic/Anaerobic Culture w Gram Stain (surgical/deep wound)     Status: None   Collection Time: 04/18/21 10:01 AM   Specimen: Abscess  Result Value Ref Range Status   Specimen Description ABSCESS  Final   Special Requests DRAIN  Final   Gram Stain   Final    MODERATE WBC PRESENT, PREDOMINANTLY MONONUCLEAR NO ORGANISMS SEEN    Culture   Final    ABUNDANT KLEBSIELLA  PNEUMONIAE ABUNDANT CITROBACTER FREUNDII NO ANAEROBES ISOLATED Performed at [facility], [address], [place], [place] [address]    Report Status 04/23/2021 FINAL  Final   Organism ID, Bacteria KLEBSIELLA PNEUMONIAE  Final   Organism ID, Bacteria CITROBACTER FREUNDII  Final      Susceptibility   Citrobacter freundii - MIC*    CEFAZOLIN >=64 RESISTANT Resistant     CEFEPIME <=0.12 SENSITIVE Sensitive     CEFTAZIDIME <=1 SENSITIVE Sensitive     CEFTRIAXONE <=0.25 SENSITIVE Sensitive     CIPROFLOXACIN <=0.25 SENSITIVE Sensitive     GENTAMICIN <=1 SENSITIVE Sensitive     IMIPENEM <=0.25 SENSITIVE Sensitive     TRIMETH/SULFA <=20 SENSITIVE Sensitive     PIP/TAZO <=4 SENSITIVE Sensitive     * ABUNDANT CITROBACTER FREUNDII   Klebsiella pneumoniae - MIC*    AMPICILLIN >=32 RESISTANT Resistant     CEFAZOLIN <=4 SENSITIVE Sensitive     CEFEPIME <=0.12 SENSITIVE Sensitive     CEFTAZIDIME <=1 SENSITIVE Sensitive     CEFTRIAXONE <=0.25 SENSITIVE Sensitive     CIPROFLOXACIN <=0.25 SENSITIVE Sensitive     GENTAMICIN <=1 SENSITIVE Sensitive     IMIPENEM <=0.25 SENSITIVE Sensitive     TRIMETH/SULFA <=20 SENSITIVE Sensitive     AMPICILLIN/SULBACTAM 4 SENSITIVE Sensitive     PIP/TAZO <=4 SENSITIVE Sensitive     * ABUNDANT KLEBSIELLA PNEUMONIAE     Terri Piedra, NP [facility]  04/25/2021  2:43 PM

## 2021-04-25 NOTE — Progress Notes (Signed)
ANTICOAGULATION CONSULT NOTE - Follow Up Consult  Pharmacy Consult for heparin Indication:  h/o PE  Labs: Recent Labs    04/23/21 0104 04/24/21 0320 04/25/21 0031  HGB 7.5* 8.0*  --   HCT 23.3* 25.4*  --   PLT 391 380  --   HEPARINUNFRC 0.54 0.42 0.51  CREATININE 0.79 0.97  --      Assessment: 56yo male on heparin infusion due to a history of PE while Eliquis on hold.  Heparin level 0.51  Goal of Therapy:  Heparin 0.3-0.7 units/ml   Plan:  Continue heparin at 2400 units/hr Daily heparin level - next 2/13 Monitor CBC and s/sx of bleeding daily F/u with apixaban resume- continue to hold 2/13. If patient can tolerate PO well, apixaban may be restarted in the next 24 hours  Thank you Vaughan Basta BS, PharmD, BCPS Clinical Pharmacist 04/25/2021, 8:20 AM

## 2021-04-25 NOTE — Progress Notes (Signed)
Progress Note   Patient: Scott Foley GOT:[id number] DOB: [birth year] DOA: 04/13/2021     12 DOS: the patient was seen and examined on 04/25/2021   Brief hospital course: 56 y.o. male with medical history significant of hx of CAD, HTN, hx of PE on eliquis, HLD, hxo of CVA in 2014, IDA with history of recent hospitalizaion on 03/16/21 for recurrent pancreatitis, sepsis due to bacteremia from klebsiella pneumoniae, covid-19 and respiratory failure requiring home oxygen on discharge.  Presented with a 4-day history of diffuse worsening abdominal pain.  He has had abdominal issues for the past 2 months.  He had a cholecystectomy on 12/21 and underwent ERCP on 12/22 since the intraoperative cholangiogram showed a CBD stone.  After ERCP he developed pancreatitis.  He was managed conservatively and then discharged home on 12/28.  He was readmitted to [facility] on 1/4 for recurrent pancreatitis and COVID-19.  Noted to have sepsis respiratory failure.  Blood cultures were positive for Klebsiella pneumonia.  He was treated with Invanz for 10 days.  Discharged on 2 L of oxygen. He had a postop visit with surgeon on 04/12/21 when he complained of abdominal pain.  A CT scan was subsequently done which showed pancreatic necrosis with gas within the pancreatic fluid collections.  He was sent over to [facility] emergency department.  Interventional radiology was consulted for aspiration of the fluid collection which was performed on 2/6.  Plan for repeat CT scan on Wednesday/Thursday.    Assessment and Plan: * Necrotizing pancreatitis- (present on admission) This is either a third episode of pancreatitis or sequelae of pancreatitis from his episode in January.  His lipase level was noted to be normal.  He does have significant leukocytosis.  CT scan was done at [facility].  Do not have access to that report but according to the H&P it showed progressive severe pancreatitis with extensive fluid collection  surrounding the pancreas and extending into the right retroperitoneum.  Many of these fluid collections containing gas concerning for infection.  Compression of splenic vein and SMV was noted although they appear to be patent.  Patient was started on meropenem.   Gastroenterology and general surgery have been consulted.  No role for endoscopic drainage at this time.  Interventional radiology consulted for percutaneous drainage which was done on 2/6  -advance diet to low fat and see about changing tube feeds to noctornal -calorie count ordered -repeat CT scan Wednesday/thursday  Protein-calorie malnutrition, severe Started on tube feedings which he is tolerating well- transition to nocturnal tube feeds.  Continue to monitor. -calorie count ordered on 2/13 Nutrition Status: Nutrition Problem: Severe Malnutrition Etiology: acute illness (pancreatitis) Signs/Symptoms: moderate muscle depletion, moderate fat depletion, energy intake < or equal to 50% for > or equal to 5 days, percent weight loss (11% weight loss within 3 months) Percent weight loss: 11 % Interventions: Tube feeding    Hypoglycemia Resolved with initiation of fluids and nutrition.  Normocytic anemia- (present on admission) Significant drop in hemoglobin noted most of which appears to be dilutional.   Anemia panel reviewed.  Vitamin B12 noted to be low normal at 254.  Being supplemented.  Folate 8.2.  Ferritin 1033.  TIBC 137.  Iron 16. Hemoglobin low but stable.  No overt bleeding identified -will transfuse 1 unit PRBC for symptomatic anemia- minimal improvement in symptoms after 1st day  Chronic respiratory failure with hypoxia ([place])- (present on admission) Recently recovered from COVID-19 infection.  Was discharged on 2 L of oxygen  by nasal cannula.  Chest x-ray continues to show opacities which could be from his recent infection rather than any infection.   Respiratory status is stable.  Hypokalemia- (present on  admission) Hypomagnesemia -repleted  AKI (acute kidney injury) ([place])- (present on admission) Presented with creatinine of 2.49. This was likely prerenal in the setting of sepsis along with nausea and vomiting over the past few days.   Patient aggressively hydrated.  Renal function has improved and back to normal now.  Monitor urine output.    Sepsis ([place])- (present on admission) On admission he met sepsis criteria with tachycardia, fever to 100.8, WBC to 18.2.  Recently noted to have Klebsiella bacteremia earlier in January when he was hospitalized in [place].  Blood cultures done here in the hospital have been negative so far. Sepsis physiology appears to have improved.  WBC is stable  Remains afebrile.  Follow-up on cultures from fluid sent after aspiration 2/6- kleb/citrobacter -ID consult for duration of abx  History of pulmonary embolism- (present on admission) Eliquis is currently on hold.  He is on heparin infusion.   After CT Scan may be able to change back to eliquis  Essential hypertension- (present on admission) Sinus tachycardia  ACE inhibitor on hold.  Borderline low blood pressures noted.   Sinus tachycardia likely secondary to severe illness. He was started back on beta-blocker due to concern for rebound tachycardia.  Heart rate is better.  Chronic obstructive pulmonary disease ([place])- (present on admission) No signs of exacerbation. Continue trelegy daily and albuterol prn.  Hyperlipemia- (present on admission) Statin on hold currently.  Triglyceride was 121 in December.  Coronary artery disease with stable angina pectoris ([place])- (present on admission) Stable.  Plavix currently on hold.      Subjective: ate better breakfast today  Physical Exam: Vitals:   04/25/21 0452 04/25/21 0500 04/25/21 0727 04/25/21 0815  BP: 138/86  (!) 151/96   Pulse: (!) 101  (!) 103   Resp: 18  (!) 21   Temp: 97.8 F (36.6 C)  97.8 F (36.6 C)   TempSrc: Oral  Oral   SpO2:  93%  95% 95%  Weight:  94 kg    Height:        General: Appearance:    Obese male in no acute distress     Lungs:     respirations unlabored  Heart:    Tachycardic.   MS:   All extremities are intact.    Neurologic:   Awake, alert     Data Reviewed: WBC down to 16  Family Communication: none at bedside  Disposition: Status is: Inpatient  Remains inpatient appropriate because: needs repeat CT scan of abd    Planned Discharge Destination: Home    Time spent: 55 minutes  Author: Geradine Girt, DO 04/25/2021 3:34 PM  For on call review [email or web address].

## 2021-04-25 NOTE — Progress Notes (Signed)
Physical Therapy Treatment Patient Details Name: Scott Foley MRN: [id number] DOB: July 09, 1965 Today's Date: 04/25/2021   History of Present Illness Pt is a 56 y.o. M who presents 04/13/2021 with abdominal pain. Admitted with acute pancreatitis with sepsis. S/p lap chole 03/02/2021 with abnormal IOC; underwent ERCP 12/22/222. S/p IR pec drain. Significant PMH: CAD, HTN, HLD, COPD, PE, CVA, IDA, tobacco abuse.    PT Comments    Pt received in supine, agreeable to therapy session with encouragement and with good participation and tolerance for stair and gait progression in hallway using cane. Pt needing up to min guard for stair negotiation and Supervision for gait and transfers, making good progress toward goals. Pt continues to benefit from PT services to progress toward functional mobility goals. Continue to recommend HHPT and cane, although pt may progress to no PT needed depending on length of acute stay and improved activity tolerance.  Recommendations for follow up therapy are one component of a multi-disciplinary discharge planning process, led by the attending physician.  Recommendations may be updated based on patient status, additional functional criteria and insurance authorization.  Follow Up Recommendations  Home health PT     Assistance Recommended at Discharge PRN  Patient can return home with the following A little help with walking and/or transfers;A little help with bathing/dressing/bathroom;Help with stairs or ramp for entrance   Equipment Recommendations  Cane    Recommendations for Other Services       Precautions / Restrictions Precautions Precautions: Fall;Other (comment) Precaution Comments: RLQ drain Restrictions Weight Bearing Restrictions: No     Mobility  Bed Mobility Overal bed mobility: Modified Independent             General bed mobility comments: use of bed features/HOB elevated    Transfers Overall transfer level: Needs  assistance Equipment used: None, Straight cane Transfers: Sit to/from Stand Sit to Stand: Supervision    General transfer comment: from EOB and recliner heights, no LOB, cues for line awareness due to lines on each side of body (IV, tele, etc)    Ambulation/Gait Ambulation/Gait assistance: Supervision Gait Distance (Feet): 150 Feet Assistive device: Straight cane Gait Pattern/deviations: Decreased stride length, Step-through pattern Gait velocity: decreased Gait velocity interpretation: <1.8 ft/sec, indicate of risk for recurrent falls   General Gait Details: slow but steady pace, no LOB, SpO2 desat in hand holding cane however when sensor repositioned reading WFL; HR elevated to 120's bpm with exertion   Stairs Stairs: Yes Stairs assistance: Min guard Stair Management: One rail Right, Step to pattern, Forwards, With cane Number of Stairs: 6 (2 steps in PT gym x3 reps) General stair comments: Pt recliner pushed to PT gym so he wasn't fatigued prior to performing. No LOB, slow pace, pt needing seated break after performing steps prior to return walk to room.      Balance Overall balance assessment: Mild deficits observed, not formally tested                                          Cognition Arousal/Alertness: Awake/alert Behavior During Therapy: WFL for tasks assessed/performed, Flat affect Overall Cognitive Status: Within Functional Limits for tasks assessed  Exercises      General Comments General comments (skin integrity, edema, etc.): VSS on RA, RN notified sensor with noisy signal, likely needs a new finger sensor for SpO2; HR mildly elevated to ~120 bpm with exertion.      Pertinent Vitals/Pain Pain Assessment Pain Assessment: 0-10 Pain Score: 3  Pain Location: cortrak related discomfort, abd and BLE mild soreness Pain Descriptors / Indicators: Discomfort, Sore Pain Intervention(s):  Monitored during session, Repositioned    Home Living                          Prior Function            PT Goals (current goals can now be found in the care plan section) Acute Rehab PT Goals Patient Stated Goal: advanced diet, to go home PT Goal Formulation: With patient Time For Goal Achievement: 05/03/21 Progress towards PT goals: Progressing toward goals    Frequency    Min 3X/week      PT Plan Current plan remains appropriate    Co-evaluation              AM-PAC PT "6 Clicks" Mobility   Outcome Measure  Help needed turning from your back to your side while in a flat bed without using bedrails?: None Help needed moving from lying on your back to sitting on the side of a flat bed without using bedrails?: None Help needed moving to and from a bed to a chair (including a wheelchair)?: A Little Help needed standing up from a chair using your arms (e.g., wheelchair or bedside chair)?: A Little Help needed to walk in hospital room?: A Little Help needed climbing 3-5 steps with a railing? : A Little 6 Click Score: 20    End of Session Equipment Utilized During Treatment: Gait belt Activity Tolerance: Patient tolerated treatment well Patient left: with call bell/phone within reach;in bed;with bed alarm set;Other (comment) (pt given ice cup per his request) Nurse Communication: Mobility status;Other (comment) (needs new finger pleth sensor, his intermittently cutting out) PT Visit Diagnosis: Unsteadiness on feet (R26.81);Muscle weakness (generalized) (M62.81);Difficulty in walking, not elsewhere classified (R26.2);Pain Pain - part of body:  (drain insertion site, mild BLE pain)     Time: 8638-1771 PT Time Calculation (min) (ACUTE ONLY): 21 min  Charges:  $Gait Training: 8-22 mins                     Amber Williard P., PTA Acute Rehabilitation Services Pager: [phone] Office: [place] 04/25/2021, 4:50 PM

## 2021-04-26 DIAGNOSIS — K8591 Acute pancreatitis with uninfected necrosis, unspecified: Secondary | ICD-10-CM | POA: Diagnosis not present

## 2021-04-26 LAB — GLUCOSE, CAPILLARY
Glucose-Capillary: 107 mg/dL — ABNORMAL HIGH (ref 70–99)
Glucose-Capillary: 117 mg/dL — ABNORMAL HIGH (ref 70–99)
Glucose-Capillary: 100 mg/dL — ABNORMAL HIGH (ref 70–99)
Glucose-Capillary: 98 mg/dL (ref 70–99)
Glucose-Capillary: 95 mg/dL (ref 70–99)

## 2021-04-26 LAB — BASIC METABOLIC PANEL
Anion gap: 12 (ref 5–15)
BUN: 24 mg/dL — ABNORMAL HIGH (ref 6–20)
CO2: 21 mmol/L — ABNORMAL LOW (ref 22–32)
Calcium: 8.9 mg/dL (ref 8.9–10.3)
Chloride: 100 mmol/L (ref 98–111)
Creatinine, Ser: 1.02 mg/dL (ref 0.61–1.24)
GFR, Estimated: >60 mL/min (ref >60)
Glucose, Bld: 104 mg/dL — ABNORMAL HIGH (ref 70–99)
Potassium: 4.1 mmol/L (ref 3.5–5.1)
Sodium: 133 mmol/L — ABNORMAL LOW (ref 135–145)

## 2021-04-26 LAB — CBC
HCT: 25.5 % — ABNORMAL LOW (ref 39.0–52.0)
Hemoglobin: 8.4 g/dL — ABNORMAL LOW (ref 13.0–17.0)
MCH: 28.1 pg (ref 26.0–34.0)
MCHC: 32.9 g/dL (ref 30.0–36.0)
MCV: 85.3 fL (ref 80.0–100.0)
Platelets: 390 10*3/uL (ref 150–400)
RBC: 2.99 MIL/uL — ABNORMAL LOW (ref 4.22–5.81)
RDW: 17.7 % — ABNORMAL HIGH (ref 11.5–15.5)
WBC: 17.2 10*3/uL — ABNORMAL HIGH (ref 4.0–10.5)
nRBC: 0 % (ref 0.0–0.2)

## 2021-04-26 LAB — HEPARIN LEVEL (UNFRACTIONATED)
Heparin Unfractionated: 0.24 IU/mL — ABNORMAL LOW (ref 0.30–0.70)
Heparin Unfractionated: 0.26 IU/mL — ABNORMAL LOW (ref 0.30–0.70)

## 2021-04-26 NOTE — Progress Notes (Signed)
Scott Foley for Infectious Disease  Date of Admission:  04/13/2021     Total days of antibiotics 13        ASSESSMENT:  Mr. Scott Foley continues to have purulent drainage from his RLQ drain. Will need to continue antibiotics as source control not completely achieved. Awaiting repeat imaging later in the week. Continue current dose of meropenem. Post-surgical wound care per General Surgery with drain management per IR. Remaining medical and supportive care per primary team.   PLAN:  Continue current dose of meropenem. Repeat imaging planned for later in the week. Post surgical care per General Surgery Remaining medical and supportive care per primary team.   Principal Problem:   Necrotizing pancreatitis Active Problems:   Coronary artery disease with stable angina pectoris (HCC)   Hyperlipemia   Chronic obstructive pulmonary disease (HCC)   Essential hypertension   History of pulmonary embolism   Sepsis ([place])   AKI (acute kidney injury) ([place])   Hypokalemia   Chronic respiratory failure with hypoxia (HCC)   Normocytic anemia   Hypoglycemia   Protein-calorie malnutrition, severe    feeding supplement  237 mL Oral BID BM   feeding supplement (PROSource TF)  45 mL Per Tube BID   feeding supplement (VITAL 1.5 CAL)  840 mL Per Tube Q24H   fluticasone furoate-vilanterol  1 puff Inhalation Daily   And   umeclidinium bromide  1 puff Inhalation Daily   free water  30 mL Per Tube Q4H   iohexol  100 mL Intravenous Once   metoprolol tartrate  50 mg Oral BID   pantoprazole  40 mg Oral BID   sodium chloride flush  5 mL Intracatheter Q8H   vitamin B-12  1,000 mcg Per Tube Daily    SUBJECTIVE:  Afebrile overnight with no acute events. Resting following bath. No new concerns/complaints.   Allergies  Allergen Reactions   Penicillin G Hives     Review of Systems: Review of Systems  Constitutional:  Negative for chills, fever and weight loss.  Respiratory:  Negative for  cough, shortness of breath and wheezing.   Cardiovascular:  Negative for chest pain and leg swelling.  Gastrointestinal:  Negative for abdominal pain, constipation, diarrhea, nausea and vomiting.  Skin:  Negative for rash.     OBJECTIVE: Vitals:   04/25/21 2041 04/26/21 0456 04/26/21 0734 04/26/21 0819  BP: (!) 134/91 (!) 131/94  (!) 131/94  Pulse: (!) 110 (!) 103 92   Resp: 20 20 16    Temp: (!) 97.5 F (36.4 C) (!) 97.5 F (36.4 C)    TempSrc: Oral Oral    SpO2: 94% 95%    Weight:      Height:       Body mass index is 35.57 kg/m.  Physical Exam Constitutional:      General: He is not in acute distress.    Appearance: He is well-developed.  Cardiovascular:     Rate and Rhythm: Regular rhythm. Tachycardia present.     Heart sounds: Normal heart sounds.  Pulmonary:     Effort: Pulmonary effort is normal.     Breath sounds: Normal breath sounds.  Abdominal:     Comments: Mild amount of purulent / tan drainage in RLQ drain  Skin:    General: Skin is warm and dry.  Neurological:     Mental Status: He is alert and oriented to person, place, and time.  Psychiatric:        Behavior: Behavior normal.  Thought Content: Thought content normal.        Judgment: Judgment normal.    Lab Results Lab Results  Component Value Date   WBC 17.2 (H) 04/26/2021   HGB 8.4 (L) 04/26/2021   HCT 25.5 (L) 04/26/2021   MCV 85.3 04/26/2021   PLT 390 04/26/2021    Lab Results  Component Value Date   CREATININE 1.02 04/26/2021   BUN 24 (H) 04/26/2021   NA 133 (L) 04/26/2021   K 4.1 04/26/2021   CL 100 04/26/2021   CO2 21 (L) 04/26/2021    Lab Results  Component Value Date   ALT 17 04/18/2021   AST 24 04/18/2021   ALKPHOS 77 04/18/2021   BILITOT 0.4 04/18/2021     Microbiology: Recent Results (from the past 240 hour(s))  Aerobic/Anaerobic Culture w Gram Stain (surgical/deep wound)     Status: None   Collection Time: 04/18/21 10:01 AM   Specimen: Abscess  Result  Value Ref Range Status   Specimen Description ABSCESS  Final   Special Requests DRAIN  Final   Gram Stain   Final    MODERATE WBC PRESENT, PREDOMINANTLY MONONUCLEAR NO ORGANISMS SEEN    Culture   Final    ABUNDANT KLEBSIELLA PNEUMONIAE ABUNDANT CITROBACTER FREUNDII NO ANAEROBES ISOLATED Performed at [facility], [place] [address] [place], [place] [address]    Report Status 04/23/2021 FINAL  Final   Organism ID, Bacteria KLEBSIELLA PNEUMONIAE  Final   Organism ID, Bacteria CITROBACTER FREUNDII  Final      Susceptibility   Citrobacter freundii - MIC*    CEFAZOLIN >=64 RESISTANT Resistant     CEFEPIME <=0.12 SENSITIVE Sensitive     CEFTAZIDIME <=1 SENSITIVE Sensitive     CEFTRIAXONE <=0.25 SENSITIVE Sensitive     CIPROFLOXACIN <=0.25 SENSITIVE Sensitive     GENTAMICIN <=1 SENSITIVE Sensitive     IMIPENEM <=0.25 SENSITIVE Sensitive     TRIMETH/SULFA <=20 SENSITIVE Sensitive     PIP/TAZO <=4 SENSITIVE Sensitive     * ABUNDANT CITROBACTER FREUNDII   Klebsiella pneumoniae - MIC*    AMPICILLIN >=32 RESISTANT Resistant     CEFAZOLIN <=4 SENSITIVE Sensitive     CEFEPIME <=0.12 SENSITIVE Sensitive     CEFTAZIDIME <=1 SENSITIVE Sensitive     CEFTRIAXONE <=0.25 SENSITIVE Sensitive     CIPROFLOXACIN <=0.25 SENSITIVE Sensitive     GENTAMICIN <=1 SENSITIVE Sensitive     IMIPENEM <=0.25 SENSITIVE Sensitive     TRIMETH/SULFA <=20 SENSITIVE Sensitive     AMPICILLIN/SULBACTAM 4 SENSITIVE Sensitive     PIP/TAZO <=4 SENSITIVE Sensitive     * ABUNDANT KLEBSIELLA PNEUMONIAE     Terri Piedra, NP [facility]  04/26/2021  12:09 PM

## 2021-04-26 NOTE — Progress Notes (Signed)
Progress Note   Patient: Scott Foley QPR:[id number] DOB: 03-12-1966 DOA: 04/13/2021     13 DOS: the patient was seen and examined on 04/26/2021   Brief hospital course: 56 y.o. male with medical history significant of hx of CAD, HTN, hx of PE on eliquis, HLD, hxo of CVA in 2014, IDA with history of recent hospitalizaion on 03/16/21 for recurrent pancreatitis, sepsis due to bacteremia from klebsiella pneumoniae, covid-19 and respiratory failure requiring home oxygen on discharge.  Presented with a 4-day history of diffuse worsening abdominal pain.  He has had abdominal issues for the past 2 months.  He had a cholecystectomy on 12/21 and underwent ERCP on 12/22 since the intraoperative cholangiogram showed a CBD stone.  After ERCP he developed pancreatitis.  He was managed conservatively and then discharged home on 12/28.  He was readmitted to [facility] on 1/4 for recurrent pancreatitis and COVID-19.  Noted to have sepsis respiratory failure.  Blood cultures were positive for Klebsiella pneumonia.  He was treated with Invanz for 10 days.  Discharged on 2 L of oxygen. He had a postop visit with surgeon on 04/12/21 when he complained of abdominal pain.  A CT scan was subsequently done which showed pancreatic necrosis with gas within the pancreatic fluid collections.  He was sent over to [facility] emergency department.  Interventional radiology was consulted for aspiration of the fluid collection which was performed on 2/6.  Plan for repeat CT scan on Wednesday/Thursday and then adjustment of abx after.    Assessment and Plan: * Necrotizing pancreatitis- (present on admission) This is either a third episode of pancreatitis or sequelae of pancreatitis from his episode in January.  His lipase level was noted to be normal.  He does have significant leukocytosis.  CT scan was done at [facility].  Do not have access to that report but according to the H&P it showed progressive severe pancreatitis with  extensive fluid collection surrounding the pancreas and extending into the right retroperitoneum.  Many of these fluid collections contained gas concerning for infection.  Compression of splenic vein and SMV was noted although they appear to be patent.  Patient was started on meropenem.   Gastroenterology consulted general surgery (signed off) Interventional radiology consulted for percutaneous drainage which was done on 2/6  -advance diet to low fat and changed tube feeds to noctornal -calorie count ordered -repeat CT scan Wednesday/thursday and then adjust abx  Protein-calorie malnutrition, severe Started on tube feedings which he is tolerating well- transition to nocturnal tube feeds.  Continue to monitor. -calorie count ordered on 2/13 Nutrition Status: Nutrition Problem: Severe Malnutrition Etiology: acute illness (pancreatitis) Signs/Symptoms: moderate muscle depletion, moderate fat depletion, energy intake < or equal to 50% for > or equal to 5 days, percent weight loss (11% weight loss within 3 months) Percent weight loss: 11 % Interventions: Tube feeding    Hypoglycemia Resolved with initiation of fluids and nutrition.  Normocytic anemia- (present on admission) Significant drop in hemoglobin noted most of which appears to be dilutional.   Anemia panel reviewed.  Vitamin B12 noted to be low normal at 254.  Being supplemented.  Folate 8.2.  Ferritin 1033.  TIBC 137.  Iron 16. Hemoglobin low but stable.  No overt bleeding identified -s/p 1 unit PRBC  Chronic respiratory failure with hypoxia ([place])- (present on admission) Recently recovered from COVID-19 infection.  Was discharged on 2 L of oxygen by nasal cannula.  Chest x-ray continues to show opacities which could be from  his recent infection rather than any infection.   Respiratory status is stable.  Hypokalemia- (present on admission) Hypomagnesemia -repleted  AKI (acute kidney injury) ([place])- (present on  admission) Presented with creatinine of 2.49. This was likely prerenal in the setting of sepsis along with nausea and vomiting over the past few days.   Patient aggressively hydrated.  Renal function has improved and back to normal now  Sepsis ([place])- (present on admission) On admission he met sepsis criteria with tachycardia, fever to 100.8, WBC to 18.2.  Recently noted to have Klebsiella bacteremia earlier in January when he was hospitalized in [place].  Blood cultures done here in the hospital have been negative so far. Sepsis physiology appears to have improved.  WBC is stable  Remains afebrile.  Follow-up on cultures from fluid sent after aspiration 2/6- kleb/citrobacter -ID consult for duration of abx- await CT Scan first  History of pulmonary embolism- (present on admission) Eliquis is currently on hold.  He is on heparin infusion.   After CT Scan may be able to change back to eliquis if no further surgery planned  Essential hypertension- (present on admission) Sinus tachycardia ACE inhibitor on hold.  Borderline low blood pressures noted.   Sinus tachycardia likely secondary to severe illness. He was started back on beta-blocker due to concern for rebound tachycardia.  Heart rate is better.  Chronic obstructive pulmonary disease ([place])- (present on admission) No signs of exacerbation. Continue trelegy daily and albuterol prn.  Hyperlipemia- (present on admission) Statin on hold currently.  Triglyceride was 121 in December.  Coronary artery disease with stable angina pectoris ([place])- (present on admission) Stable.  Plavix currently on hold.     Subjective: did not eat as well today  Physical Exam: Vitals:   04/25/21 2041 04/26/21 0456 04/26/21 0734 04/26/21 0819  BP: (!) 134/91 (!) 131/94  (!) 131/94  Pulse: (!) 110 (!) 103 92   Resp: '20 20 16   ' Temp: (!) 97.5 F (36.4 C) (!) 97.5 F (36.4 C)    TempSrc: Oral Oral    SpO2: 94% 95%    Weight:      Height:         General: Appearance:    Obese male in no acute distress     Lungs:     respirations unlabored  Heart:    Normal heart rate.     MS:   All extremities are intact. + LE Edema   Neurologic:   Awake, alert, oriented x 3. No apparent focal neurological           defect.      Data Reviewed: Na slightly low- labs in AM WBC stable around 17  Family Communication: wife at bedside  Disposition: Status is: Inpatient  Remains inpatient appropriate because: tube feeds/abx    Planned Discharge Destination: Home with Home Health   Time spent: 45 minutes  Author: Geradine Girt, DO 04/26/2021 12:50 PM  For on call review [email or web address].

## 2021-04-26 NOTE — Progress Notes (Signed)
Calorie Count Note  48 hour calorie count ordered.  Diet: heart healthy Supplements: Ensure Enlive po BID, each supplement provides 350 kcal and 20 grams of protein.  Breakfast: 160 kcals, 5 grams protein Lunch: 289 kcals, 25 grams protein Dinner: 170 kcals, 10 grams protein Supplements: 70 kcals, 4 grams protein  Total intake: 689 kcal (30% of minimum estimated needs)  44 grams protein (36% of minimum estimated needs)  Nutrition Dx: Severe Malnutrition related to acute illness (pancreatitis) as evidenced by moderate muscle depletion, moderate fat depletion, energy intake < or equal to 50% for > or equal to 5 days, percent weight loss (11% weight loss within 3 months). - ongoing  Goal: Patient will meet greater than or equal to 90% of their needs - progressing, addressing with TF and supplements  Intervention:  -continue calorie count, RD to follow-up with day 2 results on 2/15 -Continue Nocturnal TF via [employer]rak: -Vital 1.5 @ 37ml/hr x 12 hours (885ml) to be administered from 1800-0600 -60ml Prosource TF BID -67ml free water Q4H   Nocturnal TF regimen provides 1340 kcals, 78 grams protein, 820ml total free water  (Meets 58% and 65% estimated calorie and protein needs, respectively)   -continue Ensure Enlive po BID, each supplement provides 350 kcal and 20 grams of protein.   Scott Foley., MS, RD, LDN (she/her/hers) RD pager number and weekend/on-call pager number located in [place].

## 2021-04-26 NOTE — Progress Notes (Signed)
ANTICOAGULATION CONSULT NOTE  Pharmacy Consult:  Heparin Indication: History of PE  Heparin dosing weight = 80 kg  Labs: Recent Labs    04/24/21 0320 04/25/21 0031 04/26/21 0450 04/26/21 1458  HGB 8.0*  --  8.4*  --   HCT 25.4*  --  25.5*  --   PLT 380  --  390  --   HEPARINUNFRC 0.42 0.51 0.24* 0.26*  CREATININE 0.97  --  1.02  --      Assessment: 56yo male on IV heparin due to a history of PE while Eliquis on hold, last dose 2/1.  Heparin level sub-therapeutic at 0.26 units/mL on 2400 units/hr.  No issue with heparin infusion, pump nor IV site per discussion with RN.  Unsure why level is low as previous levels have been therapeutic.  No bleeding reported.  Goal of Therapy:  Heparin 0.3-0.7 units/ml   Plan:  Increase heparin infusion to 2550 units/hr F/U AM labs  Deetra Booton D. Mina Marble, PharmD, BCPS, [place] 04/26/2021, 4:46 PM

## 2021-04-26 NOTE — Progress Notes (Signed)
Mobility Specialist: Progress Note   04/26/21 1108  Mobility  Bed Position Chair  Activity Ambulated with assistance in hallway  Level of Assistance Modified independent, requires aide device or extra time  Assistive Device Cane  Distance Ambulated (ft) 275 ft  Activity Response Tolerated well  $Mobility charge 1 Mobility   Received pt in chair having no complaints and agreeable to mobility. Asymptomatic throughout ambulation, returned back to chair w/ call bell in reach and all needs met.  [facility] [place] Mobility Specialist Mobility Specialist 5 North: [phone] Mobility Specialist 6 North: [phone]

## 2021-04-26 NOTE — Progress Notes (Signed)
Occupational Therapy Treatment Patient Details Name: Scott Foley MRN: [id number] DOB: 06/15/1965 Today's Date: 04/26/2021   History of present illness Pt is a 56 y.o. M who presents 04/13/2021 with abdominal pain. Admitted with acute pancreatitis with sepsis. S/p lap chole 03/02/2021 with abnormal IOC; underwent ERCP 12/22/222. S/p IR pec drain. Significant PMH: CAD, HTN, HLD, COPD, PE, CVA, IDA, tobacco abuse.   OT comments  Pt progressing well towards goals this session, able to complete tub transfer with min guard. Pt steps laterally into tub using wall support when provided demo. Pt able to walk hallway distance managing lines with supervision. Pt presents with impairments listed below, will continue to follow. Pt mildly fatigued after hallway ambulation and tub transfer, however VSS on RA. Updated d/c recommendations, anticipate safe d/c home with assistance.   Recommendations for follow up therapy are one component of a multi-disciplinary discharge planning process, led by the attending physician.  Recommendations may be updated based on patient status, additional functional criteria and insurance authorization.    Follow Up Recommendations  No OT follow up    Assistance Recommended at Discharge Set up Supervision/Assistance  Patient can return home with the following  A little help with walking and/or transfers;A little help with bathing/dressing/bathroom;Assistance with cooking/housework;Assist for transportation;Help with stairs or ramp for entrance   Equipment Recommendations  None recommended by OT;Other (comment) (pt has all needed DME)    Recommendations for Other Services      Precautions / Restrictions Precautions Precautions: Fall;Other (comment) Precaution Comments: RLQ drain Restrictions Weight Bearing Restrictions: No       Mobility Bed Mobility               General bed mobility comments: in chair upon arrival    Transfers Overall transfer  level: Needs assistance Equipment used: None Transfers: Sit to/from Stand Sit to Stand: Supervision                 Balance Overall balance assessment: Mild deficits observed, not formally tested                                         ADL either performed or assessed with clinical judgement   ADL Overall ADL's : Needs assistance/impaired                         Toilet Transfer: Supervision/safety;Ambulation Toilet Transfer Details (indicate cue type and reason): simulated in room Toileting- Clothing Manipulation and Hygiene: Supervision/safety;Sitting/lateral lean Toileting - Clothing Manipulation Details (indicate cue type and reason): manages clothing/lines prior to sitting Tub/ Shower Transfer: Min guard;Tub transfer;Shower Scientist, research (medical) Details (indicate cue type and reason): steps laterally into tub after given demo, benefits from wall support Functional mobility during ADLs: Supervision/safety      Extremity/Trunk Assessment Upper Extremity Assessment Upper Extremity Assessment: Overall WFL for tasks assessed   Lower Extremity Assessment Lower Extremity Assessment: Defer to PT evaluation        Vision   Vision Assessment?: No apparent visual deficits   Perception Perception Perception: Not tested   Praxis Praxis Praxis: Not tested    Cognition Arousal/Alertness: Awake/alert Behavior During Therapy: WFL for tasks assessed/performed, Flat affect Overall Cognitive Status: Within Functional Limits for tasks assessed  Exercises      Shoulder Instructions       General Comments VSS on RA, wife present throughout session. Requires seated rest break after hallway ambulation    Pertinent Vitals/ Pain       Pain Assessment Pain Assessment: No/denies pain  Home Living                                          Prior Functioning/Environment               Frequency  Min 2X/week        Progress Toward Goals  OT Goals(current goals can now be found in the care plan section)  Progress towards OT goals: Progressing toward goals  Acute Rehab OT Goals Patient Stated Goal: none stated OT Goal Formulation: With patient Time For Goal Achievement: 05/04/21 Potential to Achieve Goals: Good ADL Goals Pt Will Perform Upper Body Dressing: with supervision;sitting Pt Will Perform Lower Body Dressing: with min assist;sit to/from stand;sitting/lateral leans Pt Will Transfer to Toilet: with modified independence;ambulating;regular height toilet  Plan Frequency remains appropriate;Discharge plan needs to be updated    Co-evaluation                 AM-PAC OT "6 Clicks" Daily Activity     Outcome Measure   Help from another person eating meals?: None (NPO, however has mobility to self feed) Help from another person taking care of personal grooming?: None Help from another person toileting, which includes using toliet, bedpan, or urinal?: None Help from another person bathing (including washing, rinsing, drying)?: A Little Help from another person to put on and taking off regular upper body clothing?: A Little Help from another person to put on and taking off regular lower body clothing?: A Little 6 Click Score: 21    End of Session    OT Visit Diagnosis: Unsteadiness on feet (R26.81);Other abnormalities of gait and mobility (R26.89);Muscle weakness (generalized) (M62.81)   Activity Tolerance Patient tolerated treatment well   Patient Left in chair;with call bell/phone within reach;with chair alarm set;with family/visitor present   Nurse Communication Mobility status        Time: 1022-1040 OT Time Calculation (min): 18 min  Charges: OT General Charges $OT Visit: 1 Visit OT Treatments $Self Care/Home Management : 8-22 mins  Lynnda Child, OTD, OTR/L Acute Rehab [phone] - [place] 04/26/2021, 12:21 PM

## 2021-04-26 NOTE — Progress Notes (Signed)
Referring Physician(s): Dr. [place] Pink   Supervising Physician: Sandi Mariscal  Patient Status:  [facility] - In-pt  Chief Complaint:  Pancreatic pseudocyst s/p RLQ drain placement in IR 04/18/21  Subjective:  Pt resting in bed watching TV. He denies pain but endorses tenderness to site with palpation and movement.   Allergies: Penicillin g  Medications: Prior to Admission medications   Medication Sig Start Date End Date Taking? Authorizing Provider  albuterol (VENTOLIN HFA) 108 (90 Base) MCG/ACT inhaler Inhale 2 puffs into the lungs every 6 (six) hours as needed for wheezing or shortness of breath. 04/04/21  Yes Rip Harbour, NP  apixaban (ELIQUIS) 5 MG TABS tablet Take 1 tablet (5 mg total) by mouth 2 (two) times daily. 04/01/21  Yes Rip Harbour, NP  clopidogrel (PLAVIX) 75 MG tablet Take 1 tablet (75 mg total) by mouth daily. 04/01/21  Yes Rip Harbour, NP  Evolocumab (REPATHA) 140 MG/ML SOSY Inject 140 mg into the skin every 14 (fourteen) days. 12/30/20  Yes [name], [name], MD  Fluticasone-Umeclidin-Vilant (TRELEGY ELLIPTA) 100-62.5-25 MCG/ACT AEPB Inhale 1 puff into the lungs daily. 04/04/21  Yes Rip Harbour, NP  Ibuprofen-diphenhydrAMINE Cit (ADVIL PM PO) Take 1 tablet by mouth at bedtime.   Yes [provider]  isosorbide mononitrate (IMDUR) 60 MG 24 hr tablet Take 1 tablet (60 mg total) by mouth daily. 04/01/21  Yes Rip Harbour, NP  lisinopril (ZESTRIL) 40 MG tablet Take 1 tablet (40 mg total) by mouth daily. 04/01/21  Yes Rip Harbour, NP  metoprolol tartrate (LOPRESSOR) 50 MG tablet Take 1 tablet (50 mg total) by mouth 2 (two) times daily. 04/01/21  Yes Rip Harbour, NP  Omega-3 Fatty Acids (FISH OIL) 1000 MG CAPS Take 1 capsule (1,000 mg total) by mouth 2 (two) times daily. 11/30/20  Yes [name], [name], MD  pantoprazole (PROTONIX) 40 MG tablet Take 1 tablet (40 mg total) by mouth daily. Patient taking differently: Take 40 mg by mouth 2 (two) times  daily. 02/15/21  Yes Rip Harbour, NP  rosuvastatin (CRESTOR) 40 MG tablet Take 1 tablet (40 mg total) by mouth daily. Patient taking differently: Take 40 mg by mouth at bedtime. 04/01/21  Yes Rip Harbour, NP     Vital Signs: BP (!) 131/94 (BP Location: Left Arm)    Pulse 92    Temp (!) 97.5 F (36.4 C) (Oral)    Resp 16    Ht 5\' 4"  (1.626 m)    Wt 207 lb 3.7 oz (94 kg)    SpO2 95%    BMI 35.57 kg/m   Physical Exam Constitutional:      Appearance: He is well-developed. He is not ill-appearing.  HENT:     Head: Normocephalic and atraumatic.     Nose:     Comments: Cortrack in place Eyes:     Extraocular Movements: Extraocular movements intact.     Pupils: Pupils are equal, round, and reactive to light.  Abdominal:     Comments:  RLQ drain intact with sutures and statlock in place. Mild redness and dried blood noted to insertion site. No swelling, active bleeding or oozing noted. Dressing C/D/I. ~25 cc tan, feculent OP in gravity bag with 15 cc documented in Epic over past 24 hours. Drain flushes, aspirates easily.   Neurological:     Mental Status: He is alert.    Imaging: No results found.  Labs:  CBC: Recent Labs    04/22/21 0335 04/23/21  0104 04/24/21 0320 04/26/21 0450  WBC 15.5* 18.2* 16.5* 17.2*  HGB 7.4* 7.5* 8.0* 8.4*  HCT 23.4* 23.3* 25.4* 25.5*  PLT 391 391 380 390    COAGS: Recent Labs    04/13/21 1523 04/14/21 0230 04/15/21 0153 04/15/21 0909 04/15/21 2008 04/16/21 0649 04/17/21 0341  INR 2.4*  --  1.6*  --   --  1.4* 1.5*  APTT 52*   < >  --  62* 69* 74* 65*   < > = values in this interval not displayed.    BMP: Recent Labs    04/22/21 0335 04/23/21 0104 04/24/21 0320 04/26/21 0450  NA 135 135 135 133*  K 3.9 4.3 4.1 4.1  CL 102 101 101 100  CO2 25 25 24  21*  GLUCOSE 130* 110* 103* 104*  BUN 16 18 20  24*  CALCIUM 8.0* 8.2* 8.4* 8.9  CREATININE 0.82 0.79 0.97 1.02  GFRNONAA >60 >60 >60 >60    LIVER FUNCTION  TESTS: Recent Labs    04/15/21 0153 04/16/21 0649 04/17/21 0637 04/18/21 0251  BILITOT 0.6 0.3 0.2* 0.4  AST 17 18 16 24   ALT 18 17 15 17   ALKPHOS 98 92 67 77  PROT 5.6* 5.8* 5.3* 4.8*  ALBUMIN 1.7* 1.7* 1.5* <1.5*    Assessment and Plan:  Pancreatic pseudocyst s/p RLQ drain placement in IR 04/18/21.   Pt sitting upright in bed. He denies pain at insertion site, but endorses tenderness with palpation and movement. He states he is feeling better today.    RLQ drain intact with sutures and statlock in place. Mild redness and dried blood noted to insertion site. No swelling, active bleeding or oozing noted. Dressing C/D/I. ~25 cc tan, feculent OP in gravity bag with 15 cc documented in Epic. Drain flushes, aspirates easily.    WBC slightly elevated from 16.5 to 17.2 today. Afebrile. VSS   Continue documenting OP in Epic q shift.  Continue flushing TID.  Change dressing q shift or as needed.  Please call IR with questions or concerns.      Electronically Signed: Tyson Alias, NP 04/26/2021, 2:40 PM   I spent a total of 15 Minutes at the the patient's bedside AND on the patient's hospital floor or unit, greater than 50% of which was counseling/coordinating care for pancreatic pseudocyst s/p RLQ drain placement in IR 04/18/21.

## 2021-04-27 DIAGNOSIS — I10 Essential (primary) hypertension: Secondary | ICD-10-CM | POA: Diagnosis not present

## 2021-04-27 DIAGNOSIS — K8591 Acute pancreatitis with uninfected necrosis, unspecified: Secondary | ICD-10-CM | POA: Diagnosis not present

## 2021-04-27 DIAGNOSIS — D649 Anemia, unspecified: Secondary | ICD-10-CM | POA: Diagnosis not present

## 2021-04-27 DIAGNOSIS — Z86711 Personal history of pulmonary embolism: Secondary | ICD-10-CM | POA: Diagnosis not present

## 2021-04-27 LAB — BASIC METABOLIC PANEL
Anion gap: 9 (ref 5–15)
BUN: 27 mg/dL — ABNORMAL HIGH (ref 6–20)
CO2: 23 mmol/L (ref 22–32)
Calcium: 8.9 mg/dL (ref 8.9–10.3)
Chloride: 100 mmol/L (ref 98–111)
Creatinine, Ser: 1.11 mg/dL (ref 0.61–1.24)
GFR, Estimated: >60 mL/min (ref >60)
Glucose, Bld: 120 mg/dL — ABNORMAL HIGH (ref 70–99)
Potassium: 4 mmol/L (ref 3.5–5.1)
Sodium: 132 mmol/L — ABNORMAL LOW (ref 135–145)

## 2021-04-27 LAB — CBC
HCT: 24.9 % — ABNORMAL LOW (ref 39.0–52.0)
Hemoglobin: 8.3 g/dL — ABNORMAL LOW (ref 13.0–17.0)
MCH: 28.5 pg (ref 26.0–34.0)
MCHC: 33.3 g/dL (ref 30.0–36.0)
MCV: 85.6 fL (ref 80.0–100.0)
Platelets: 406 10*3/uL — ABNORMAL HIGH (ref 150–400)
RBC: 2.91 MIL/uL — ABNORMAL LOW (ref 4.22–5.81)
RDW: 17.5 % — ABNORMAL HIGH (ref 11.5–15.5)
WBC: 16.5 10*3/uL — ABNORMAL HIGH (ref 4.0–10.5)
nRBC: 0 % (ref 0.0–0.2)

## 2021-04-27 LAB — GLUCOSE, CAPILLARY
Glucose-Capillary: 101 mg/dL — ABNORMAL HIGH (ref 70–99)
Glucose-Capillary: 112 mg/dL — ABNORMAL HIGH (ref 70–99)
Glucose-Capillary: 117 mg/dL — ABNORMAL HIGH (ref 70–99)
Glucose-Capillary: 119 mg/dL — ABNORMAL HIGH (ref 70–99)
Glucose-Capillary: 91 mg/dL (ref 70–99)
Glucose-Capillary: 98 mg/dL (ref 70–99)

## 2021-04-27 LAB — HEPARIN LEVEL (UNFRACTIONATED): Heparin Unfractionated: 0.36 IU/mL (ref 0.30–0.70)

## 2021-04-27 LAB — C-REACTIVE PROTEIN: CRP: 12.6 mg/dL — ABNORMAL HIGH (ref <1.0)

## 2021-04-27 NOTE — Progress Notes (Signed)
Calorie Count Note  48 hour calorie count ordered.  Diet: heart healthy Supplements: Ensure Enlive po BID, each supplement provides 350 kcal and 20 grams of protein.  Day 1 Results (04/25/21): Breakfast: 160 kcals, 5 grams protein Lunch: 289 kcals, 25 grams protein Dinner: 170 kcals, 10 grams protein Supplements: 70 kcals, 4 grams protein  Total intake: 689 kcal (30% of minimum estimated needs)  44 grams protein (36% of minimum estimated needs)  Day 2 Results (04/26/21): Breakfast: 11 kcals, 2 grams protein Lunch: 545 kcals, 22 grams protein Dinner: 252 kcals, 18 grams protein Supplements: documented that pt accepted supplements but no specific documentation regarding amount consumed was provided, so unable to account for supplement in calorie count results   Total intake: 809 kcal (35% of minimum estimated needs)  42 grams protein (35% of minimum estimated needs)  Nutrition Dx: Severe Malnutrition related to acute illness (pancreatitis) as evidenced by moderate muscle depletion, moderate fat depletion, energy intake < or equal to 50% for > or equal to 5 days, percent weight loss (11% weight loss within 3 months). - ongoing  Goal: Patient will meet greater than or equal to 90% of their needs - progressing, addressing with TF and supplements  Intervention:  -d/c calorie count -Continue Nocturnal TF via Cortrak as pt not meeting >75% estimated needs PO: -Vital 1.5 @ 92ml/hr x 12 hours (833ml) to be administered from 1800-0600 -36ml Prosource TF BID -22ml free water Q4H   Nocturnal TF regimen provides 1340 kcals, 78 grams protein, 825ml total free water  (Meets 58% and 65% estimated calorie and protein needs, respectively)   -continue Ensure Enlive po BID, each supplement provides 350 kcal and 20 grams of protein.   Theone Stanley., MS, RD, LDN (she/her/hers) RD pager number and weekend/on-call pager number located in [place].

## 2021-04-27 NOTE — Progress Notes (Signed)
Mobility Specialist: Progress Note   04/27/21 1030  Mobility  Bed Position Chair  Activity Ambulated with assistance in hallway  Level of Assistance Modified independent, requires aide device or extra time  Assistive Device Cane  Distance Ambulated (ft) 275 ft  Activity Response Tolerated well  $Mobility charge 1 Mobility   Received pt in chair having no complaints and agreeable to mobility. Asymptomatic throughout ambulation, returned back to chair w/ call bell in reach and all needs met.  [facility] Hawkin Charo Mobility Specialist Mobility Specialist 5 North: [phone] Mobility Specialist 6 North: [phone]

## 2021-04-27 NOTE — Progress Notes (Signed)
[facility] PROGRESS NOTE   Scott Foley YKZ:[id number] DOB: 02-Oct-1965 DOA: 04/13/2021  14 DOS: the patient was seen and examined on 04/27/2021  PCP: [name], MD  Brief History and Hospital Course:  56 y.o. male with medical history significant of hx of CAD, HTN, hx of PE on eliquis, HLD, hxo of CVA in 2014, IDA with history of recent hospitalizaion on 03/16/21 for recurrent pancreatitis, sepsis due to bacteremia from klebsiella pneumoniae, covid-19 and respiratory failure requiring home oxygen on discharge.  Presented with a 4-day history of diffuse worsening abdominal pain.  He has had abdominal issues for the past 2 months.  He had a cholecystectomy on 12/21 and underwent ERCP on 12/22 since the intraoperative cholangiogram showed a CBD stone.  After ERCP he developed pancreatitis.  He was managed conservatively and then discharged home on 12/28.  He was readmitted to [facility] on 1/4 for recurrent pancreatitis and COVID-19.  Noted to have sepsis respiratory failure.  Blood cultures were positive for Klebsiella pneumonia.  He was treated with Invanz for 10 days.  Discharged on 2 L of oxygen. He had a postop visit with surgeon on 04/12/21 when he complained of abdominal pain.  A CT scan was subsequently done which showed pancreatic necrosis with gas within the pancreatic fluid collections.  He was sent over to [facility] emergency department.  Interventional radiology was consulted for aspiration of the fluid collection which was performed on 2/6.  Plan for repeat CT scan on Wednesday/Thursday and then adjustment of abx after.    Consultants: Gastroenterology.  Interventional radiology.  General surgery.  Infectious disease  Procedures: Drainage of fluid collection in the abdomen by interventional radiology on 2/6    Subjective: Patient mentions that he feels well.  Abdominal pain is much better.  No nausea vomiting.    Assessment/Plan:   * Necrotizing  pancreatitis- (present on admission) This is either a third episode of pancreatitis or sequelae of pancreatitis from his episode in January.  His lipase level was noted to be normal.  He does have significant leukocytosis.  CT scan was done at [facility].  Do not have access to that report but according to the H&P it showed progressive severe pancreatitis with extensive fluid collection surrounding the pancreas and extending into the right retroperitoneum.  Many of these fluid collections contained gas concerning for infection.  Compression of splenic vein and SMV was noted although they appear to be patent.  Patient was started on meropenem.   Gastroenterology consulted general surgery (signed off) Interventional radiology consulted for percutaneous drainage which was done on 2/6  -advance diet to low fat and changed tube feeds to noctornal -calorie count ordered Plan to repeat CT scan later today or tomorrow.  Antibiotics regimen to be determined depending on CT scan findings.  Sepsis ([place])- (present on admission) On admission he met sepsis criteria with tachycardia, fever to 100.8, WBC to 18.2.  Recently noted to have Klebsiella bacteremia earlier in January when he was hospitalized in [place].  Blood cultures done here in the hospital have been negative so far. Sepsis physiology appears to have improved.   Patient remains afebrile.  WBC is elevated but stable.   Cultures from fluid sent after aspiration 2/6- kleb/citrobacter -ID consult for duration of abx-  Plan is to repeat CT abdomen pelvis.    Hypokalemia- (present on admission) Hypomagnesemia  Repleted.  Check every so often.  AKI (acute kidney injury) ([place])- (present on admission) Presented with creatinine of  2.49. This was likely prerenal in the setting of sepsis along with nausea and vomiting over the past few days.   Patient aggressively hydrated.  Renal function has improved and back to normal now  Normocytic anemia-  (present on admission) Significant drop in hemoglobin noted most of which appears to be dilutional.   Anemia panel reviewed.  Vitamin B12 noted to be low normal at 254.  Being supplemented.  Folate 8.2.  Ferritin 1033.  TIBC 137.  Iron 16. He was transfused 1 unit of PRBC. Hemoglobin low but stable.    History of pulmonary embolism- (present on admission) Eliquis is currently on hold.  He is on heparin infusion.   After CT Scan may be able to change back to eliquis if no further surgery planned.  Chronic respiratory failure with hypoxia ([place])- (present on admission) Recently recovered from COVID-19 infection.  Was discharged on 2 L of oxygen by nasal cannula.  Admission chest x-ray showed opacities which could be from his recent infection rather than any infection.   Respiratory status is stable.  Chronic obstructive pulmonary disease ([place])- (present on admission) No signs of exacerbation. Continue trelegy daily and albuterol prn.  Essential hypertension- (present on admission) Sinus tachycardia  ACE inhibitor on hold due to borderline low blood pressures. Sinus tachycardia likely secondary to severe illness. He was started back on beta-blocker due to concern for rebound tachycardia.  Heart rate is better.  Hyperlipemia- (present on admission) Statin on hold currently.  Triglyceride was 121 in December.  Coronary artery disease with stable angina pectoris ([place])- (present on admission) Stable.  Plavix currently on hold.   Protein-calorie malnutrition, severe Started on tube feedings which he is tolerating well- transition to nocturnal tube feeds.  Continue to monitor. -calorie count ordered on 2/13 Nutrition Status: Nutrition Problem: Severe Malnutrition Etiology: acute illness (pancreatitis) Signs/Symptoms: moderate muscle depletion, moderate fat depletion, energy intake < or equal to 50% for > or equal to 5 days, percent weight loss (11% weight loss within 3 months) Percent  weight loss: 11 % Interventions: Tube feeding    Hypoglycemia Resolved with initiation of fluids and nutrition.   Obesity Estimated body mass index is 35.57 kg/m as calculated from the following:   Height as of this encounter: '5\' 4"'  (1.626 m).   Weight as of this encounter: 94 kg.    DVT Prophylaxis: On IV heparin infusion Code Status: Full code Family Communication: Discussed with patient Disposition Plan: Hopefully return home when improved  Status is: Inpatient  Remains inpatient appropriate because: Acute pancreatitis, infected intra-abdominal fluid collection       Medications: Scheduled:  feeding supplement  237 mL Oral BID BM   feeding supplement (PROSource TF)  45 mL Per Tube BID   feeding supplement (VITAL 1.5 CAL)  840 mL Per Tube Q24H   fluticasone furoate-vilanterol  1 puff Inhalation Daily   And   umeclidinium bromide  1 puff Inhalation Daily   free water  30 mL Per Tube Q4H   iohexol  100 mL Intravenous Once   metoprolol tartrate  50 mg Oral BID   pantoprazole  40 mg Oral BID   sodium chloride flush  5 mL Intracatheter Q8H   vitamin B-12  1,000 mcg Per Tube Daily   Continuous:  heparin 2,550 Units/hr (04/27/21 0855)   meropenem (MERREM) IV 1 g (04/27/21 0858)   DTP:[id number], albuterol, morphine injection, ondansetron **OR** ondansetron (ZOFRAN) IV, oxyCODONE  Antibiotics: Anti-infectives (From admission, onward)    Start  Dose/Rate Route Frequency Ordered Stop   04/16/21 1600  meropenem (MERREM) 1 g in sodium chloride 0.9 % 100 mL IVPB        1 g 200 mL/hr over 30 Minutes Intravenous Every 8 hours 04/16/21 1234     04/13/21 2000  meropenem (MERREM) 1 g in sodium chloride 0.9 % 100 mL IVPB  Status:  Discontinued        1 g 200 mL/hr over 30 Minutes Intravenous Every 12 hours 04/13/21 1934 04/16/21 1234   04/13/21 1800  ceFEPIme (MAXIPIME) 2 g in sodium chloride 0.9 % 100 mL IVPB  Status:  Discontinued        2 g 200 mL/hr over 30  Minutes Intravenous Every 12 hours 04/13/21 1731 04/13/21 1852   04/13/21 1715  levofloxacin (LEVAQUIN) IVPB 750 mg  Status:  Discontinued        750 mg 100 mL/hr over 90 Minutes Intravenous  Once 04/13/21 1713 04/13/21 1728   04/13/21 1715  metroNIDAZOLE (FLAGYL) IVPB 500 mg  Status:  Discontinued        500 mg 100 mL/hr over 60 Minutes Intravenous  Once 04/13/21 1713 04/13/21 1852       Objective:  Vital Signs  Vitals:   04/26/21 0819 04/26/21 2228 04/27/21 0744 04/27/21 0839  BP: (!) 131/94 (!) 133/94 123/83   Pulse:   90   Resp:  (!) 25 17   Temp:  (!) 97.5 F (36.4 C) 97.7 F (36.5 C)   TempSrc:  Oral    SpO2:  95% 95% 96%  Weight:      Height:        Intake/Output Summary (Last 24 hours) at 04/27/2021 1130 Last data filed at 04/27/2021 0714 Gross per 24 hour  Intake 360 ml  Output 1530 ml  Net -1170 ml   Filed Weights   04/22/21 0454 04/24/21 0500 04/25/21 0500  Weight: 90.3 kg 94 kg 94 kg    General appearance: Awake alert.  In no distress Resp: Clear to auscultation bilaterally.  Normal effort Cardio: S1-S2 is normal regular.  No S3-S4.  No rubs murmurs or bruit GI: Abdomen is soft.  Mildly tender in the epigastric area.  Drain is noted in the right abdomen. Extremities: No edema.  Full range of motion of lower extremities. Neurologic: Alert and oriented x3.  No focal neurological deficits.    Lab Results:  Data Reviewed: I have personally reviewed labs and imaging study reports  CBC: Recent Labs  Lab 04/22/21 0335 04/23/21 0104 04/24/21 0320 04/26/21 0450 04/27/21 0412  WBC 15.5* 18.2* 16.5* 17.2* 16.5*  HGB 7.4* 7.5* 8.0* 8.4* 8.3*  HCT 23.4* 23.3* 25.4* 25.5* 24.9*  MCV 88.6 87.3 87.0 85.3 85.6  PLT 391 391 380 390 406*    Basic Metabolic Panel: Recent Labs  Lab 04/22/21 0335 04/23/21 0104 04/24/21 0320 04/26/21 0450 04/27/21 0412  NA 135 135 135 133* 132*  K 3.9 4.3 4.1 4.1 4.0  CL 102 101 101 100 100  CO2 '25 25 24 ' 21* 23   GLUCOSE 130* 110* 103* 104* 120*  BUN '16 18 20 ' 24* 27*  CREATININE 0.82 0.79 0.97 1.02 1.11  CALCIUM 8.0* 8.2* 8.4* 8.9 8.9    GFR: Estimated Creatinine Clearance: 76.8 mL/min (by C-G formula based on SCr of 1.11 mg/dL).   CBG: Recent Labs  Lab 04/26/21 0756 04/26/21 1248 04/26/21 1544 04/27/21 0347 04/27/21 0744  GLUCAP 100* 98 95 119* 101*     Recent Results (  from the past 240 hour(s))  Aerobic/Anaerobic Culture w Gram Stain (surgical/deep wound)     Status: None   Collection Time: 04/18/21 10:01 AM   Specimen: Abscess  Result Value Ref Range Status   Specimen Description ABSCESS  Final   Special Requests DRAIN  Final   Gram Stain   Final    MODERATE WBC PRESENT, PREDOMINANTLY MONONUCLEAR NO ORGANISMS SEEN    Culture   Final    ABUNDANT KLEBSIELLA PNEUMONIAE ABUNDANT CITROBACTER FREUNDII NO ANAEROBES ISOLATED Performed at [facility], [address] [place], [place] [address]    Report Status 04/23/2021 FINAL  Final   Organism ID, Bacteria KLEBSIELLA PNEUMONIAE  Final   Organism ID, Bacteria CITROBACTER FREUNDII  Final      Susceptibility   Citrobacter freundii - MIC*    CEFAZOLIN >=64 RESISTANT Resistant     CEFEPIME <=0.12 SENSITIVE Sensitive     CEFTAZIDIME <=1 SENSITIVE Sensitive     CEFTRIAXONE <=0.25 SENSITIVE Sensitive     CIPROFLOXACIN <=0.25 SENSITIVE Sensitive     GENTAMICIN <=1 SENSITIVE Sensitive     IMIPENEM <=0.25 SENSITIVE Sensitive     TRIMETH/SULFA <=20 SENSITIVE Sensitive     PIP/TAZO <=4 SENSITIVE Sensitive     * ABUNDANT CITROBACTER FREUNDII   Klebsiella pneumoniae - MIC*    AMPICILLIN >=32 RESISTANT Resistant     CEFAZOLIN <=4 SENSITIVE Sensitive     CEFEPIME <=0.12 SENSITIVE Sensitive     CEFTAZIDIME <=1 SENSITIVE Sensitive     CEFTRIAXONE <=0.25 SENSITIVE Sensitive     CIPROFLOXACIN <=0.25 SENSITIVE Sensitive     GENTAMICIN <=1 SENSITIVE Sensitive     IMIPENEM <=0.25 SENSITIVE Sensitive     TRIMETH/SULFA <=20 SENSITIVE  Sensitive     AMPICILLIN/SULBACTAM 4 SENSITIVE Sensitive     PIP/TAZO <=4 SENSITIVE Sensitive     * ABUNDANT KLEBSIELLA PNEUMONIAE      Radiology Studies: No results found.     LOS: 14 days   [employer] on [email or web address]  04/27/2021, 11:30 AM

## 2021-04-27 NOTE — Progress Notes (Signed)
[facility]  Date of Admission:  04/13/2021     Total days of antibiotics 14         ASSESSMENT:  Scott Foley continues to have output from his drain and is waiting repeat CT scan today or tomorrow. Tolerating oral intake.  Continue current dose of meropenem. Further antibiotic recommendations pending CT scan results. Drain management per IR with remaining medical and supportive care per primary team.   PLAN:  Continue meropenem.  Repeat CT scan for interval improvements. Drain management per IR Remaining medical and supportive care per primary team.   Principal Problem:   Necrotizing pancreatitis Active Problems:   Coronary artery disease with stable angina pectoris (HCC)   Hyperlipemia   Chronic obstructive pulmonary disease (HCC)   Essential hypertension   History of pulmonary embolism   Sepsis ([name])   AKI (acute kidney injury) ([name])   Hypokalemia   Chronic respiratory failure with hypoxia (HCC)   Normocytic anemia   Hypoglycemia   Protein-calorie malnutrition, severe    feeding supplement  237 mL Oral BID BM   feeding supplement (PROSource TF)  45 mL Per Tube BID   feeding supplement (VITAL 1.5 CAL)  840 mL Per Tube Q24H   fluticasone furoate-vilanterol  1 puff Inhalation Daily   And   umeclidinium bromide  1 puff Inhalation Daily   free water  30 mL Per Tube Q4H   iohexol  100 mL Intravenous Once   metoprolol tartrate  50 mg Oral BID   pantoprazole  40 mg Oral BID   sodium chloride flush  5 mL Intracatheter Q8H   vitamin B-12  1,000 mcg Per Tube Daily    SUBJECTIVE:  Afebrile overnight with no acute events. Tolerating PO intake. New new concerns/complaints. Wife at bedside.   Allergies  Allergen Reactions   Penicillin G Hives     Review of Systems: Review of Systems  Constitutional:  Negative for chills, fever and weight loss.  Respiratory:  Negative for cough, shortness of breath and wheezing.   Cardiovascular:   Negative for chest pain and leg swelling.  Gastrointestinal:  Negative for abdominal pain, constipation, diarrhea, nausea and vomiting.  Skin:  Negative for rash.     OBJECTIVE: Vitals:   04/26/21 0819 04/26/21 2228 04/27/21 0744 04/27/21 0839  BP: (!) 131/94 (!) 133/94 123/83   Pulse:   90   Resp:  (!) 25 17   Temp:  (!) 97.5 F (36.4 C) 97.7 F (36.5 C)   TempSrc:  Oral    SpO2:  95% 95% 96%  Weight:      Height:       Body mass index is 35.57 kg/m.  Physical Exam Constitutional:      General: He is not in acute distress.    Appearance: He is well-developed.  Cardiovascular:     Rate and Rhythm: Normal rate and regular rhythm.     Heart sounds: Normal heart sounds.  Abdominal:     General: Bowel sounds are normal.  Skin:    General: Skin is warm and dry.  Neurological:     Mental Status: He is alert.  Psychiatric:        Mood and Affect: Mood normal.        Thought Content: Thought content normal.        Judgment: Judgment normal.    Lab Results Lab Results  Component Value Date   WBC 16.5 (H) 04/27/2021   HGB 8.3 (  L) 04/27/2021   HCT 24.9 (L) 04/27/2021   MCV 85.6 04/27/2021   PLT 406 (H) 04/27/2021    Lab Results  Component Value Date   CREATININE 1.11 04/27/2021   BUN 27 (H) 04/27/2021   NA 132 (L) 04/27/2021   K 4.0 04/27/2021   CL 100 04/27/2021   CO2 23 04/27/2021    Lab Results  Component Value Date   ALT 17 04/18/2021   AST 24 04/18/2021   ALKPHOS 77 04/18/2021   BILITOT 0.4 04/18/2021     Microbiology: Recent Results (from the past 240 hour(s))  Aerobic/Anaerobic Culture w Gram Stain (surgical/deep wound)     Status: None   Collection Time: 04/18/21 10:01 AM   Specimen: Abscess  Result Value Ref Range Status   Specimen Description ABSCESS  Final   Special Requests DRAIN  Final   Gram Stain   Final    MODERATE WBC PRESENT, PREDOMINANTLY MONONUCLEAR NO ORGANISMS SEEN    Culture   Final    ABUNDANT KLEBSIELLA  PNEUMONIAE ABUNDANT CITROBACTER FREUNDII NO ANAEROBES ISOLATED Performed at [facility], [place] [address] [place], [place] [id number]    Report Status 04/23/2021 FINAL  Final   Organism ID, Bacteria KLEBSIELLA PNEUMONIAE  Final   Organism ID, Bacteria CITROBACTER FREUNDII  Final      Susceptibility   Citrobacter freundii - MIC*    CEFAZOLIN >=64 RESISTANT Resistant     CEFEPIME <=0.12 SENSITIVE Sensitive     CEFTAZIDIME <=1 SENSITIVE Sensitive     CEFTRIAXONE <=0.25 SENSITIVE Sensitive     CIPROFLOXACIN <=0.25 SENSITIVE Sensitive     GENTAMICIN <=1 SENSITIVE Sensitive     IMIPENEM <=0.25 SENSITIVE Sensitive     TRIMETH/SULFA <=20 SENSITIVE Sensitive     PIP/TAZO <=4 SENSITIVE Sensitive     * ABUNDANT CITROBACTER FREUNDII   Klebsiella pneumoniae - MIC*    AMPICILLIN >=32 RESISTANT Resistant     CEFAZOLIN <=4 SENSITIVE Sensitive     CEFEPIME <=0.12 SENSITIVE Sensitive     CEFTAZIDIME <=1 SENSITIVE Sensitive     CEFTRIAXONE <=0.25 SENSITIVE Sensitive     CIPROFLOXACIN <=0.25 SENSITIVE Sensitive     GENTAMICIN <=1 SENSITIVE Sensitive     IMIPENEM <=0.25 SENSITIVE Sensitive     TRIMETH/SULFA <=20 SENSITIVE Sensitive     AMPICILLIN/SULBACTAM 4 SENSITIVE Sensitive     PIP/TAZO <=4 SENSITIVE Sensitive     * ABUNDANT KLEBSIELLA PNEUMONIAE     Terri Piedra, NP [facility]  04/27/2021  10:58 AM

## 2021-04-27 NOTE — Progress Notes (Signed)
ANTICOAGULATION CONSULT NOTE - Follow Up Consult  Pharmacy Consult for heparin Indication: pulmonary embolus  Allergies  Allergen Reactions   Penicillin G Hives    Patient Measurements: Height: 5\' 4"  (162.6 cm) Weight: 94 kg (207 lb 3.7 oz) IBW/kg (Calculated) : 59.2 Heparin Dosing Weight: 80 kg  Vital Signs: Temp: 97.7 F (36.5 C) (02/15 0744) Temp Source: Oral (02/14 2228) BP: 123/83 (02/15 0744) Pulse Rate: 90 (02/15 0744)  Labs: Recent Labs    04/26/21 0450 04/26/21 1458 04/27/21 0412  HGB 8.4*  --  8.3*  HCT 25.5*  --  24.9*  PLT 390  --  406*  HEPARINUNFRC 0.24* 0.26* 0.36  CREATININE 1.02  --  1.11    Estimated Creatinine Clearance: 76.8 mL/min (by C-G formula based on SCr of 1.11 mg/dL).   Medications:  Medications Prior to Admission  Medication Sig Dispense Refill Last Dose   albuterol (VENTOLIN HFA) 108 (90 Base) MCG/ACT inhaler Inhale 2 puffs into the lungs every 6 (six) hours as needed for wheezing or shortness of breath. 8 g 0 Past Week   apixaban (ELIQUIS) 5 MG TABS tablet Take 1 tablet (5 mg total) by mouth 2 (two) times daily. 90 tablet 0 04/13/2021 at 0800   clopidogrel (PLAVIX) 75 MG tablet Take 1 tablet (75 mg total) by mouth daily. 90 tablet 0 04/13/2021   Evolocumab (REPATHA) 140 MG/ML SOSY Inject 140 mg into the skin every 14 (fourteen) days. 6 mL 0 04/03/2021   Fluticasone-Umeclidin-Vilant (TRELEGY ELLIPTA) 100-62.5-25 MCG/ACT AEPB Inhale 1 puff into the lungs daily. 1 each 11 04/12/2021   Ibuprofen-diphenhydrAMINE Cit (ADVIL PM PO) Take 1 tablet by mouth at bedtime.   04/12/2021   isosorbide mononitrate (IMDUR) 60 MG 24 hr tablet Take 1 tablet (60 mg total) by mouth daily. 90 tablet 0 04/13/2021   lisinopril (ZESTRIL) 40 MG tablet Take 1 tablet (40 mg total) by mouth daily. 90 tablet 0 04/13/2021   metoprolol tartrate (LOPRESSOR) 50 MG tablet Take 1 tablet (50 mg total) by mouth 2 (two) times daily. 180 tablet 0 04/13/2021 at 0800   Omega-3 Fatty Acids  (FISH OIL) 1000 MG CAPS Take 1 capsule (1,000 mg total) by mouth 2 (two) times daily. 180 capsule 0 04/13/2021   pantoprazole (PROTONIX) 40 MG tablet Take 1 tablet (40 mg total) by mouth daily. (Patient taking differently: Take 40 mg by mouth 2 (two) times daily.) 90 tablet 3 04/13/2021   rosuvastatin (CRESTOR) 40 MG tablet Take 1 tablet (40 mg total) by mouth daily. (Patient taking differently: Take 40 mg by mouth at bedtime.) 90 tablet 0 04/12/2021    Assessment: 56yo male on IV heparin due to a history of PE while Eliquis on hold, last dose 2/1.   Heparin level therapeutic this morning at 0.36 units/mL after increase in rate last night to 2550 units/hr.   No issue with heparin infusion or bleeding reported.  Goal of Therapy:  Heparin level 0.3-0.7 units/ml Monitor platelets by anticoagulation protocol: Yes   Plan:  Continue heparin infusion at 2550 units/hr Check heparin level daily while on heparin Continue to monitor H&H and platelets F/u post CT for plans to restart apixaban vs. more procedures   Thank you for allowing [place] to participate in this patients care. Jens Som, PharmD 04/27/2021 7:52 AM  **Pharmacist phone directory can be found on [email or web address] listed under [place]**

## 2021-04-27 NOTE — Progress Notes (Signed)
Pharmacy Antibiotic Note  Scott Foley is a 56 y.o. male admitted on 04/13/2021 with  Necrotizing pancreatitis with klebseilla and citrobacter isolated from abscess fluid .  Pharmacy is consulted for Meropenem dosing.  Plan: Meropenem 1 gram IV every 8 hours Continuing meropenem pending repeat imaging later this week F/u ID recs  Height: 5\' 4"  (162.6 cm) Weight: 94 kg (207 lb 3.7 oz) IBW/kg (Calculated) : 59.2  Temp (24hrs), Avg:97.6 F (36.4 C), Min:97.5 F (36.4 C), Max:97.7 F (36.5 C)  Recent Labs  Lab 04/22/21 0335 04/23/21 0104 04/24/21 0320 04/26/21 0450 04/27/21 0412  WBC 15.5* 18.2* 16.5* 17.2* 16.5*  CREATININE 0.82 0.79 0.97 1.02 1.11    Estimated Creatinine Clearance: 76.8 mL/min (by C-G formula based on SCr of 1.11 mg/dL).    Allergies  Allergen Reactions   Penicillin G Hives    Antimicrobials this admission: 2/1 Flagyl >> x 1 2/1 Meropenem >>    Microbiology results: 2/1 blood>>ngtdF 2/1 urine>>ngF 2/6 drain>>ABUNDANT KLEBSIELLA PNEUMONIAE (resistance to amp only) ABUNDANT CITROBACTER FREUNDII (resistance to cefazolin only)    Thank you for allowing [place] to participate in this patients care. Jens Som, PharmD 04/27/2021 8:04 AM  **Pharmacist phone directory can be found on [email or web address] listed under [place]**

## 2021-04-27 NOTE — Progress Notes (Signed)
Referring Physician(s): Bonnielee Haff  Supervising Physician: Corrie Mckusick  Patient Status:  [facility] - In-pt  Chief Complaint:  Pancreatic pseudocyst s/p RLQ drain placement in IR 04/18/21.   Subjective:  Pt sitting in chair, NAD. Reports abd pain is not bed today.  No N/V.  Reports he is going for CT tomorrow.   Allergies: Penicillin g  Medications: Prior to Admission medications   Medication Sig Start Date End Date Taking? Authorizing Provider  albuterol (VENTOLIN HFA) 108 (90 Base) MCG/ACT inhaler Inhale 2 puffs into the lungs every 6 (six) hours as needed for wheezing or shortness of breath. 04/04/21  Yes Rip Harbour, NP  apixaban (ELIQUIS) 5 MG TABS tablet Take 1 tablet (5 mg total) by mouth 2 (two) times daily. 04/01/21  Yes Rip Harbour, NP  clopidogrel (PLAVIX) 75 MG tablet Take 1 tablet (75 mg total) by mouth daily. 04/01/21  Yes Rip Harbour, NP  Evolocumab (REPATHA) 140 MG/ML SOSY Inject 140 mg into the skin every 14 (fourteen) days. 12/30/20  Yes [name], [name], MD  Fluticasone-Umeclidin-Vilant (TRELEGY ELLIPTA) 100-62.5-25 MCG/ACT AEPB Inhale 1 puff into the lungs daily. 04/04/21  Yes Rip Harbour, NP  Ibuprofen-diphenhydrAMINE Cit (ADVIL PM PO) Take 1 tablet by mouth at bedtime.   Yes [provider]  isosorbide mononitrate (IMDUR) 60 MG 24 hr tablet Take 1 tablet (60 mg total) by mouth daily. 04/01/21  Yes Rip Harbour, NP  lisinopril (ZESTRIL) 40 MG tablet Take 1 tablet (40 mg total) by mouth daily. 04/01/21  Yes Rip Harbour, NP  metoprolol tartrate (LOPRESSOR) 50 MG tablet Take 1 tablet (50 mg total) by mouth 2 (two) times daily. 04/01/21  Yes Rip Harbour, NP  Omega-3 Fatty Acids (FISH OIL) 1000 MG CAPS Take 1 capsule (1,000 mg total) by mouth 2 (two) times daily. 11/30/20  Yes [name], [name], MD  pantoprazole (PROTONIX) 40 MG tablet Take 1 tablet (40 mg total) by mouth daily. Patient taking differently: Take 40 mg by mouth 2  (two) times daily. 02/15/21  Yes Rip Harbour, NP  rosuvastatin (CRESTOR) 40 MG tablet Take 1 tablet (40 mg total) by mouth daily. Patient taking differently: Take 40 mg by mouth at bedtime. 04/01/21  Yes Rip Harbour, NP     Vital Signs: BP 124/86 (BP Location: Left Arm)    Pulse 92    Temp 97.7 F (36.5 C) (Oral)    Resp 20    Ht 5\' 4"  (1.626 m)    Wt 207 lb 3.7 oz (94 kg)    SpO2 96%    BMI 35.57 kg/m   Physical Exam Vitals reviewed.  Constitutional:      Appearance: He is well-developed.  HENT:     Head: Normocephalic and atraumatic.  Pulmonary:     Effort: Pulmonary effort is normal.  Abdominal:     General: Abdomen is flat.  Skin:    General: Skin is warm and dry.     Comments: Positive RUQ drain to gravity bag. Site is unremarkable with no erythema, edema, tenderness, bleeding or drainage. Suture and stat lock in place. Dressing is clean, dry, and intact. 50 ml of  cream colored fluid noted in the bag. Drain aspirates and flushes well.    Neurological:     Mental Status: He is alert and oriented to person, place, and time.  Psychiatric:        Mood and Affect: Mood normal.  Behavior: Behavior normal.    Imaging: No results found.  Labs:  CBC: Recent Labs    04/23/21 0104 04/24/21 0320 04/26/21 0450 04/27/21 0412  WBC 18.2* 16.5* 17.2* 16.5*  HGB 7.5* 8.0* 8.4* 8.3*  HCT 23.3* 25.4* 25.5* 24.9*  PLT 391 380 390 406*    COAGS: Recent Labs    04/13/21 1523 04/14/21 0230 04/15/21 0153 04/15/21 0909 04/15/21 2008 04/16/21 0649 04/17/21 0341  INR 2.4*  --  1.6*  --   --  1.4* 1.5*  APTT 52*   < >  --  62* 69* 74* 65*   < > = values in this interval not displayed.    BMP: Recent Labs    04/23/21 0104 04/24/21 0320 04/26/21 0450 04/27/21 0412  NA 135 135 133* 132*  K 4.3 4.1 4.1 4.0  CL 101 101 100 100  CO2 25 24 21* 23  GLUCOSE 110* 103* 104* 120*  BUN 18 20 24* 27*  CALCIUM 8.2* 8.4* 8.9 8.9  CREATININE 0.79 0.97 1.02 1.11   GFRNONAA >60 >60 >60 >60    LIVER FUNCTION TESTS: Recent Labs    04/15/21 0153 04/16/21 0649 04/17/21 0637 04/18/21 0251  BILITOT 0.6 0.3 0.2* 0.4  AST 17 18 16 24   ALT 18 17 15 17   ALKPHOS 98 92 67 77  PROT 5.6* 5.8* 5.3* 4.8*  ALBUMIN 1.7* 1.7* 1.5* <1.5*    Assessment and Plan:  56 y.o. male with pancreatic pseudocyst s/p RLQ drain placement in IR 04/18/21  Afebrile, WBC fluctuating 16.5 Today (17.2 yesterday)    Drain Location: RLQ Size: Fr size: 10 Fr Date of placement: 2/6   Currently to: Drain collection device: gravity 24 hour output:  Output by Drain (mL) 04/25/21 0701 - 04/25/21 1900 04/25/21 1901 - 04/26/21 0700 04/26/21 0701 - 04/26/21 1900 04/26/21 1901 - 04/27/21 0700 04/27/21 0701 - 04/27/21 1558  Closed System Drain 1 Right RLQ Other (Comment) 12 Fr. 15  5  25     Interval imaging/drain manipulation:  None   Current examination: Flushes/aspirates easily.  Insertion site unremarkable. Suture and stat lock in place. Dressed appropriately.   Plan: Continue TID flushes with 5 cc NS. Record output Q shift. Dressing changes QD or PRN if soiled.  Call IR APP or on call IR MD if difficulty flushing or sudden change in drain output.  Repeat imaging/possible drain injection once output < 10 mL/QD (excluding flush material.)  Discharge planning: Please contact IR APP or on call IR MD prior to patient d/c to ensure appropriate follow up plans are in place. Typically patient will follow up with IR clinic 10-14 days post d/c for repeat imaging/possible drain injection. IR scheduler will contact patient with date/time of appointment. Patient will need to flush drain QD with 5 cc NS, record output QD, dressing changes every 2-3 days or earlier if soiled.   IR will continue to follow - please call with questions or concerns.     Electronically Signed: Tera Mater, PA-C 04/27/2021, 3:58 PM   I spent a total of 15 Minutes at the the patient's bedside AND on the  patient's hospital floor or unit, greater than 50% of which was counseling/coordinating care for RLQ drain.  This chart was dictated using voice recognition software.  Despite best efforts to proofread,  errors can occur which can change the documentation meaning.

## 2021-04-27 NOTE — Progress Notes (Signed)
[facility] ROUNDING NOTE   Subjective: No acute events overnight.  Tolerating p.o. intake.  Mild pain at drain insertion site, but otherwise no complaints.   Objective: Vital signs in last 24 hours: Temp:  [97.5 F (36.4 C)-97.7 F (36.5 C)] 97.7 F (36.5 C) (02/15 0744) Pulse Rate:  [90] 90 (02/15 0744) Resp:  [17-25] 17 (02/15 0744) BP: (123-133)/(83-94) 123/83 (02/15 0744) SpO2:  [95 %-96 %] 96 % (02/15 0839) Last BM Date : 04/22/21 General: NAD Lungs:  CTA b/l, no w/r/r Heart:  RRR, no m/r/g Abdomen:  Soft, NT, ND, +BS    Intake/Output from previous day: 02/14 0701 - 02/15 0700 In: 670 [P.O.:480; NG/GT:90; IV Piggyback:100] Out: 1505 [Urine:1500; Drains:5] Intake/Output this shift: Total I/O In: -  Out: 25 [Drains:25]   Lab Results: Recent Labs    04/26/21 0450 04/27/21 0412  WBC 17.2* 16.5*  HGB 8.4* 8.3*  PLT 390 406*  MCV 85.3 85.6   BMET Recent Labs    04/26/21 0450 04/27/21 0412  NA 133* 132*  K 4.1 4.0  CL 100 100  CO2 21* 23  GLUCOSE 104* 120*  BUN 24* 27*  CREATININE 1.02 1.11  CALCIUM 8.9 8.9   LFT No results for input(s): PROT, ALBUMIN, AST, ALT, ALKPHOS, BILITOT, BILIDIR, IBILI in the last 72 hours. PT/INR No results for input(s): INR in the last 72 hours.    Imaging/Other results: No results found.    Assessment and Plan:  1) Necrotizing pancreatitis - Severe necrotizing pancreatitis after ERCP on 03/03/2021 at outside facility.  Cultures have grown Klebsiella and Citrobacter - Percutaneous drain placement on 04/18/2021 - Currently on meropenem.  Continued ABX per consulting ID service - Plan for repeat CT today or tomorrow to assess for interval improvement - WBC stable at 16.5 (from 17 yesterday) - Drain management per consulting IR service  2) PE - On heparin gtt.  3) Anemia - Stable H/H 8.3/25.  No overt GI blood loss  GI service will continue to follow periodically  Lavena Bullion, DO   04/27/2021, 9:29 AM [facility] Pager [phone]

## 2021-04-28 ENCOUNTER — Inpatient Hospital Stay (HOSPITAL_COMMUNITY): Payer: Medicare HMO

## 2021-04-28 DIAGNOSIS — Z86711 Personal history of pulmonary embolism: Secondary | ICD-10-CM | POA: Diagnosis not present

## 2021-04-28 DIAGNOSIS — I10 Essential (primary) hypertension: Secondary | ICD-10-CM | POA: Diagnosis not present

## 2021-04-28 DIAGNOSIS — I314 Cardiac tamponade: Secondary | ICD-10-CM | POA: Diagnosis not present

## 2021-04-28 DIAGNOSIS — I3139 Other pericardial effusion (noninflammatory): Secondary | ICD-10-CM | POA: Diagnosis not present

## 2021-04-28 DIAGNOSIS — D649 Anemia, unspecified: Secondary | ICD-10-CM | POA: Diagnosis not present

## 2021-04-28 DIAGNOSIS — K8591 Acute pancreatitis with uninfected necrosis, unspecified: Secondary | ICD-10-CM | POA: Diagnosis not present

## 2021-04-28 LAB — GLUCOSE, CAPILLARY
Glucose-Capillary: 119 mg/dL — ABNORMAL HIGH (ref 70–99)
Glucose-Capillary: 95 mg/dL (ref 70–99)
Glucose-Capillary: 92 mg/dL (ref 70–99)
Glucose-Capillary: 88 mg/dL (ref 70–99)
Glucose-Capillary: 97 mg/dL (ref 70–99)
Glucose-Capillary: 98 mg/dL (ref 70–99)

## 2021-04-28 LAB — HEPARIN LEVEL (UNFRACTIONATED): Heparin Unfractionated: 0.32 IU/mL (ref 0.30–0.70)

## 2021-04-28 MED ORDER — IOHEXOL 300 MG/ML  SOLN
100.0000 mL | Freq: Once | INTRAMUSCULAR | Status: AC | PRN
  Administered 2021-04-28: 100 mL via INTRAVENOUS

## 2021-04-28 NOTE — Consult Note (Signed)
Cardiology Consultation:   Patient ID: Scott Foley MRN: [id number]; DOB: 1965/12/06  Admit date: 04/13/2021 Date of Consult: 04/28/2021  PCP:  [name], MD   [facility] Providers Cardiologist:  Revankar     Patient Profile:   Scott Foley is a 56 y.o. male with a hx of CAD by CT, HTN, COPD, HLD, h/o CVA in 2014, h/o PE who is being seen 04/28/2021 for the evaluation of pericardial effusion at the request of Dr. Jennette Kettle.  History of Present Illness:   Mr. Scott Foley is currently hospitalized for acute necrotizing pancreatitis and sepsis due to klebsiella bacteremia. He had CT abdomen and pelvis earlier today that showed an incidental finding of large pericardial effusion. Radiology read CT as showing "evidence of cardiac tamponade and relative leftward shift of the interventricular septum when compared to prior examination." Pt has remained hemodynamically stable, with SBP consistently in the 120s, although HR is mildly elevated in the 110s.  Admitting hospitalist evaluated pt for possible pulsus paradoxus; pt not found to have pulsus on exam. Pt has also been asymptomatic aside from the belly pain associated from his pericarditis, without cardiac complaints. TTE was done to evaluate the pericardial effusion; there is a large pericardial effusion, circumferential but a little larger anteriorly, ~3.0cm with fibrinous material present within the pericardium. There is evidence of systolic RA collapse, diastolic RV collapse, mildly dilated IVC (2.2cm), but <25% variation in mitral inflow. Essentially echo signs are consistent w/ early tamponade physiology.   Past Medical History:  Diagnosis Date   Acquired thrombophilia ([place]) 12/06/2020   Acute pancreatitis after endoscopic retrograde cholangiopancreatography (ERCP) 03/03/2021   ERCP by Dr Odie Sera in [place] 03/03/2021   Chest pain 02/09/2021   Chronic obstructive pulmonary disease ([place]) 07/16/2018   Colon  polyp 10/25/2020   serrated adenoma.   Coronary artery disease due to lipid rich plaque 01/01/2021   Coronary artery disease with stable angina pectoris [facility])    AMI No heart cath, treated medically [facility]   Diverticulosis of colon 10/25/2020   Noted on colnoscopy by Dr Vicente Males in [place].   DNR (do not resuscitate) 03/03/2019   Hyperlipemia    Hypertension    Hypertensive heart disease without congestive heart failure 12/06/2020   Morbid obesity ([place]) 12/06/2020   BMI 35 with serious comorbidities including CAD, HTN, STROKE, Hyperlipidemia.   Need for vaccination 12/15/2019   Pulmonary embolus ([place]) 03/03/2019   Pulmonary nodules/lesions, multiple 11/25/2012   9/15/2014CT Chest : small nodule RUL LUL. Stable since 03/2012 Smoking cessation advised and Rx chantix Repeat CT Chest 4 /21/15: subpleural lymph nodes, likely benign repeat one year 2016>>>no change, considered benign Arlyce Harman 11/25/2012  NORMAL   Sequela, post-stroke 07/28/2019   Tobacco use disorder 11/25/2012    Past Surgical History:  Procedure Laterality Date   COLONOSCOPY WITH PROPOFOL N/A 10/25/2020   Jonathon Bellows, MD, at [facility]. 25 MM serrated adenoma polyp at ascending colon removed and site clipped.  Pan diverticulosis.   TONSILLECTOMY       Home Medications:  Prior to Admission medications   Medication Sig Start Date End Date Taking? Authorizing Provider  albuterol (VENTOLIN HFA) 108 (90 Base) MCG/ACT inhaler Inhale 2 puffs into the lungs every 6 (six) hours as needed for wheezing or shortness of breath. 04/04/21  Yes Rip Harbour, NP  apixaban (ELIQUIS) 5 MG TABS tablet Take 1 tablet (5 mg total) by mouth 2 (two) times daily. 04/01/21  Yes Rip Harbour, NP  clopidogrel (PLAVIX)  75 MG tablet Take 1 tablet (75 mg total) by mouth daily. 04/01/21  Yes Rip Harbour, NP  Evolocumab (REPATHA) 140 MG/ML SOSY Inject 140 mg into the skin every 14 (fourteen) days. 12/30/20  Yes [name], [name], MD   Fluticasone-Umeclidin-Vilant (TRELEGY ELLIPTA) 100-62.5-25 MCG/ACT AEPB Inhale 1 puff into the lungs daily. 04/04/21  Yes Rip Harbour, NP  Ibuprofen-diphenhydrAMINE Cit (ADVIL PM PO) Take 1 tablet by mouth at bedtime.   Yes [provider]  isosorbide mononitrate (IMDUR) 60 MG 24 hr tablet Take 1 tablet (60 mg total) by mouth daily. 04/01/21  Yes Rip Harbour, NP  lisinopril (ZESTRIL) 40 MG tablet Take 1 tablet (40 mg total) by mouth daily. 04/01/21  Yes Rip Harbour, NP  metoprolol tartrate (LOPRESSOR) 50 MG tablet Take 1 tablet (50 mg total) by mouth 2 (two) times daily. 04/01/21  Yes Rip Harbour, NP  Omega-3 Fatty Acids (FISH OIL) 1000 MG CAPS Take 1 capsule (1,000 mg total) by mouth 2 (two) times daily. 11/30/20  Yes [name], [name], MD  pantoprazole (PROTONIX) 40 MG tablet Take 1 tablet (40 mg total) by mouth daily. Patient taking differently: Take 40 mg by mouth 2 (two) times daily. 02/15/21  Yes Rip Harbour, NP  rosuvastatin (CRESTOR) 40 MG tablet Take 1 tablet (40 mg total) by mouth daily. Patient taking differently: Take 40 mg by mouth at bedtime. 04/01/21  Yes Rip Harbour, NP    Inpatient Medications: Scheduled Meds:  feeding supplement  237 mL Oral BID BM   feeding supplement (PROSource TF)  45 mL Per Tube BID   feeding supplement (VITAL 1.5 CAL)  840 mL Per Tube Q24H   fluticasone furoate-vilanterol  1 puff Inhalation Daily   And   umeclidinium bromide  1 puff Inhalation Daily   free water  30 mL Per Tube Q4H   metoprolol tartrate  50 mg Oral BID   pantoprazole  40 mg Oral BID   sodium chloride flush  5 mL Intracatheter Q8H   vitamin B-12  1,000 mcg Per Tube Daily   Continuous Infusions:  heparin 2,550 Units/hr (04/28/21 2126)   meropenem (MERREM) IV 1 g (04/28/21 1712)   PRN Meds: acetaminophen, albuterol, morphine injection, ondansetron **OR** ondansetron (ZOFRAN) IV, oxyCODONE  Allergies:    Allergies  Allergen Reactions    Penicillin G Hives    Social History:   Social History   Socioeconomic History   Marital status: Married    Spouse name: Not on file   Number of children: Not on file   Years of education: Not on file   Highest education level: Not on file  Occupational History   Not on file  Tobacco Use   Smoking status: Former    Packs/day: 0.75    Years: 30.00    Pack years: 22.50    Types: Cigarettes    Start date: 03/14/1979    Quit date: 11/2020    Years since quitting: 0.4   Smokeless tobacco: Never   Tobacco comments:    Will start chantix.   Vaping Use   Vaping Use: Former   Start date: 07/28/2018  Substance and Sexual Activity   Alcohol use: No   Drug use: No   Sexual activity: Not on file  Other Topics Concern   Not on file  Social History Narrative   Not on file   Social Determinants of Health   Financial Resource Strain: Not on file  Food Insecurity: Not on file  Transportation Needs: Not on file  Physical Activity: Not on file  Stress: Not on file  Social Connections: Not on file  Intimate Partner Violence: Not on file    Family History:    Family History  Problem Relation Age of Onset   Hypertension Father    Heart attack Father    Stroke Father    Prostate cancer Father    Hypertension Sister    Hypertension Brother    Heart attack Brother    Lung cancer Sister      ROS:  Please see the history of present illness.   All other ROS reviewed and negative.     Physical Exam/Data:   Vitals:   04/28/21 0444 04/28/21 0750 04/28/21 0825 04/28/21 1322  BP: 123/89 126/90  122/89  Pulse:  94  92  Resp: (!) 22 17  17   Temp: 97.9 F (36.6 C) (!) 97.5 F (36.4 C)  (!) 97.5 F (36.4 C)  TempSrc: Oral Oral  Oral  SpO2:  94% 93% 97%  Weight:      Height:        Intake/Output Summary (Last 24 hours) at 04/28/2021 2240 Last data filed at 04/28/2021 1500 Gross per 24 hour  Intake --  Output 520 ml  Net -520 ml   Last 3 Weights 04/25/2021 04/24/2021  04/22/2021  Weight (lbs) 207 lb 3.7 oz 207 lb 3.7 oz 199 lb 1.2 oz  Weight (kg) 94 kg 94 kg 90.3 kg     Body mass index is 35.57 kg/m.  General:  Well nourished, well developed, in no acute distress HEENT: normal Neck: no JVD Vascular: No carotid bruits; Distal pulses 2+ bilaterally Cardiac:  normal S1, S2, tachy; RRR; no murmur  Lungs:  clear to auscultation bilaterally, no wheezing, rhonchi or rales  Abd: soft Ext: no edema Musculoskeletal:  No deformities Skin: warm and dry  Neuro:  no focal abnormalities noted Psych:  Normal affect   EKG:  The EKG was personally reviewed and demonstrates:  ST with low voltages in the precordium, poor RWP Telemetry:  Telemetry was personally reviewed and demonstrates:  sinus tachycardia, HR 110s  Relevant CV Studies: Echo prelim Large circumferential pericardial effusion, ~3.0cm at maximum depth. There is evidence of systolic RA collapse, diastolic RV collapse, mildly dilated IVC (2.2cm), but <25% variation in mitral inflow. Essentially, echo is consistent with early or impending tamponade. LVEF is >55%  Laboratory Data:  High Sensitivity Troponin:  No results for input(s): TROPONINIHS in the last 720 hours.   Chemistry Recent Labs  Lab 04/24/21 0320 04/26/21 0450 04/27/21 0412  NA 135 133* 132*  K 4.1 4.1 4.0  CL 101 100 100  CO2 24 21* 23  GLUCOSE 103* 104* 120*  BUN 20 24* 27*  CREATININE 0.97 1.02 1.11  CALCIUM 8.4* 8.9 8.9  GFRNONAA >60 >60 >60  ANIONGAP 10 12 9     No results for input(s): PROT, ALBUMIN, AST, ALT, ALKPHOS, BILITOT in the last 168 hours. Lipids No results for input(s): CHOL, TRIG, HDL, LABVLDL, LDLCALC, CHOLHDL in the last 168 hours.  Hematology Recent Labs  Lab 04/24/21 0320 04/26/21 0450 04/27/21 0412  WBC 16.5* 17.2* 16.5*  RBC 2.92* 2.99* 2.91*  HGB 8.0* 8.4* 8.3*  HCT 25.4* 25.5* 24.9*  MCV 87.0 85.3 85.6  MCH 27.4 28.1 28.5  MCHC 31.5 32.9 33.3  RDW 17.8* 17.7* 17.5*  PLT 380 390 406*    Thyroid No results for input(s): TSH, FREET4 in the last 168  hours.  BNPNo results for input(s): BNP, PROBNP in the last 168 hours.  DDimer No results for input(s): DDIMER in the last 168 hours.   Radiology/Studies:  CT ABDOMEN PELVIS W CONTRAST  Addendum Date: 04/28/2021   ADDENDUM REPORT: 04/28/2021 19:43 ADDENDUM: These results were called by telephone at the time of interpretation on 04/28/2021 at 7:39 pm to provider Dr. Olena Heckle, Who verbally acknowledged these results. Electronically Signed   By: Fidela Salisbury M.D.   On: 04/28/2021 19:43   Result Date: 04/28/2021 CLINICAL DATA:  Necrotizing pancreatitis, follow-up examination EXAM: CT ABDOMEN AND PELVIS WITH CONTRAST TECHNIQUE: Multidetector CT imaging of the abdomen and pelvis was performed using the standard protocol following bolus administration of intravenous contrast. RADIATION DOSE REDUCTION: This exam was performed according to the departmental dose-optimization program which includes automated exposure control, adjustment of the mA and/or kV according to patient size and/or use of iterative reconstruction technique. CONTRAST:  168mL OMNIPAQUE IOHEXOL 300 MG/ML  SOLN COMPARISON:  04/18/2021 FINDINGS: Lower chest: Moderate, complex appearing pericardial effusion demonstrating subtle pericardial enhancement has developed with evidence of cardiac tamponade and relative leftward shift of the interventricular septum when compared to prior examination. Small bilateral pleural effusions are again identified with associated bibasilar compressive atelectasis. Nodular densities within the subpleural right middle lobe are unchanged. Hepatobiliary: No focal liver abnormality is seen. Status post cholecystectomy. No biliary dilatation. Mild pneumobilia within the nondependent left hepatic lobe again noted. Pancreas: Findings in keeping with necrotizing pancreatitis are again identified with heterogeneous enhancement the pancreatic parenchyma best  appreciated within the head and body of the pancreas, stable since prior examination. Acute peripancreatic necrotic collections adjacent to the mid body of the pancreas at axial image # 36/3 measuring 3.9 x 2.9 cm appear better defined though a discrete capsule is not clearly identified to suggest walled off necrosis at this time. However, more loculated gas and complex fluid containing collections adjacent to the head of the pancreas measuring 3.0 x 3.9 cm at axial image # 38/3 and 2.2 x 3.8 cm at axial image # 47/3 now demonstrate more well-defined enhancing margins and are compatible with areas of walled-off necrosis. As noted previously, super infection is not excluded. Smaller peripancreatic acute necrotic collections adjacent to the tail of the pancreas appear relatively stable since prior examination. Inflammatory changes are again identified tracking into the pericolic gutters bilaterally. Spleen: Unremarkable.  The splenic vein is patent. Adrenals/Urinary Tract: Adrenal glands are unremarkable. Kidneys are normal, without renal calculi, focal lesion, or hydronephrosis. Bladder is unremarkable. Stomach/Bowel: Nasoenteric feeding tube tip seen within the proximal jejunum beyond the mid lumen of [place]. Mild ascending colonic diverticulosis. The stomach, small bowel, and large bowel are otherwise unremarkable there is no evidence of obstruction or focal inflammation. Appendix normal. No free intraperitoneal gas or fluid. Vascular/Lymphatic: Previously noted narrowing of the portosplenic confluence, best appreciated on axial image # 36/3, appears improved since prior examination. The splenic vein, superior mesenteric vein, and portal vein appear patent. Extensive aortoiliac atherosclerotic calcification. Focal excrescence of the infrarenal abdominal aorta measuring 3.4 cm in transaxial dimension at axial image # 41/3, is stable possibly representing a a thrombosed penetrating atherosclerotic ulcer or focal  dissection. This is unchanged since remote prior examination of 03/03/2019. No pathologic adenopathy within the abdomen and pelvis. Reproductive: Prostate is unremarkable. Other: The previously noted area of walled-off necrosis within the right retroperitoneum within the iliac fossa has undergone percutaneous drainage with a cope loop catheter well position within the residual collection. The  collection has decreased in size, now measuring 2.9 x 3.6 cm at axial image # 61/3. Tiny right fat containing inguinal hernia. Mild subcutaneous edema noted within the flanks bilaterally. Musculoskeletal: No acute bone abnormality. No lytic or blastic bone lesions are identified. Stable remote L1 compression fracture IMPRESSION: Interval development of a moderate pericardial effusion demonstrating complex features with pericardial enhancement and evidence of cardiac tamponade with mild relative leftward shift of the intraventricular septum when compared to prior examination of 04/18/2021. Correlation with echocardiography is recommended. Stable changes of anasarca with small bilateral pleural effusions and mild subcutaneous edema within the flanks bilaterally. Evolving changes of necrotizing pancreatitis. Stable enhancement of the residual pancreatic parenchyma. Increasing organization peripancreatic necrosis adjacent to the head and body of the pancreas with areas of more formal walled-off necrosis adjacent to the pancreatic head demonstrating slight interval decrease in size since prior examination. Intraluminal gas within areas of walled-off necrosis are again identified and superinfection is not excluded. Interval percutaneous drainage of area retroperitoneal walled-off necrosis within the right iliac fossa with interval decrease in size of loculated collection. Improved mass effect upon the portosplenic confluence with wide patency of the superior mesenteric vein, splenic vein, and portal vein. Attempts are being made at  this time to contact the patient is managing clinician for direct verbal communication of these findings. Electronically Signed: By: Fidela Salisbury M.D. On: 04/28/2021 19:25     Assessment and Plan:   Pericardial effusion: pt does have large pericardial effusion with echo signs c/w early or impending tamponade. However pt is very stable currently from a hemodynamic standpoint, and asymptomatic. Case has been discussed w/ interventionalist on call, and we agree that there is no need for urgent or emergent pericardiocentesis at this time. Would increase frequency of BP checks on the floor to keep tabs on hemodynamic status. If pt becomes unstable, please notify us and we can consider emergent tap if indicated. CAD by calcification on CT: cont current med regimen. There are no acute ischemia issues at this time HTN/dyslipidemia: mgmt as per primary hospitalist team   Risk Assessment/Risk Scores:                For questions or updates, please contact [place] Please consult [email or web address] for contact info under    Signed, Rudean Curt, MD, [facility] 04/28/2021 10:40 PM

## 2021-04-28 NOTE — Plan of Care (Signed)
  Problem: Pain Managment: Goal: General experience of comfort will improve Outcome: Progressing   Problem: Safety: Goal: Ability to remain free from injury will improve Outcome: Progressing   Problem: Skin Integrity: Goal: Risk for impaired skin integrity will decrease Outcome: Progressing   

## 2021-04-28 NOTE — Progress Notes (Signed)
°  Echocardiogram 2D Echocardiogram has been performed.  Scott Foley 04/28/2021, 10:23 PM

## 2021-04-28 NOTE — Progress Notes (Signed)
Physical Therapy Treatment Patient Details Name: Scott Foley MRN: [id number] DOB: [birth year] Today's Date: 04/28/2021   History of Present Illness Pt is a 56 y.o. M who presents 04/13/2021 with abdominal pain. Admitted with acute pancreatitis with sepsis. S/p lap chole 03/02/2021 with abnormal IOC; underwent ERCP 12/22/222. S/p IR pec drain. Significant PMH: CAD, HTN, HLD, COPD, PE, CVA, IDA, tobacco abuse.    PT Comments    Pt received in supine, agreeable to therapy session, with good participation and tolerance for 4" platform step-ups in room and household distance gait task. Pt continues to benefit from use of cane for stability with gait/stairs. Emphasis on activity pacing, benefits of mobility, and use of IS/exercise for endurance building. Pt continues to benefit from PT services to progress toward functional mobility goals. Frequency decreased per pt good progress toward goals and working consistently with mobility techs, will benefit from multiple sessions of mobility/ambulation during day with techs/nursing staff.  Recommendations for follow up therapy are one component of a multi-disciplinary discharge planning process, led by the attending physician.  Recommendations may be updated based on patient status, additional functional criteria and insurance authorization.  Follow Up Recommendations  Home health PT     Assistance Recommended at Discharge PRN  Patient can return home with the following A little help with walking and/or transfers;A little help with bathing/dressing/bathroom;Help with stairs or ramp for entrance   Equipment Recommendations  Cane    Recommendations for Other Services       Precautions / Restrictions Precautions Precautions: Fall;Other (comment) Precaution Comments: RLQ drain Restrictions Weight Bearing Restrictions: No     Mobility  Bed Mobility Overal bed mobility: Modified Independent             General bed mobility comments: no  physical assist needed, fair awareness of lines    Transfers Overall transfer level: Needs assistance Equipment used: None Transfers: Sit to/from Stand Sit to Stand: Supervision           General transfer comment: cues for awareness of RLQ drain    Ambulation/Gait Ambulation/Gait assistance: Supervision Gait Distance (Feet): 40 Feet Assistive device: Straight cane Gait Pattern/deviations: Decreased stride length, Step-through pattern       General Gait Details: around room before/after stair trial; distance limited as per RN pt leaving soon for scan so returned to bed   Stairs Stairs: Yes Stairs assistance: Min guard, Supervision Stair Management: No rails, Step to pattern, Forwards, With cane Number of Stairs: 10 General stair comments: pt ascended/descended 4" platform step in room x10 reps, cues for proper use of cane for support, no LOB. Cues for activity pacing per RR/HR on monitor   Wheelchair Mobility    Modified Rankin (Stroke Patients Only)       Balance Overall balance assessment: Mild deficits observed, not formally tested                                          Cognition Arousal/Alertness: Awake/alert Behavior During Therapy: WFL for tasks assessed/performed, Flat affect Overall Cognitive Status: Within Functional Limits for tasks assessed                                          Exercises Other Exercises Other Exercises: IS x10 reps (improved to 1250 today, previous  session was at 750)    General Comments General comments (skin integrity, edema, etc.): VSS, RR elevated with stair ascent to 30s RPM, cues for standing break/slow breathing      Pertinent Vitals/Pain Pain Assessment Pain Assessment: Faces Faces Pain Scale: Hurts a little bit Pain Location: cortrak related discomfort, abd and BLE mild soreness Pain Descriptors / Indicators: Discomfort, Sore Pain Intervention(s): Monitored during session,  Repositioned     PT Goals (current goals can now be found in the care plan section) Acute Rehab PT Goals Patient Stated Goal: advanced diet, to go home PT Goal Formulation: With patient Time For Goal Achievement: 05/03/21 Progress towards PT goals: Progressing toward goals    Frequency    Min 3X/week      PT Plan Current plan remains appropriate       AM-PAC PT "6 Clicks" Mobility   Outcome Measure  Help needed turning from your back to your side while in a flat bed without using bedrails?: None Help needed moving from lying on your back to sitting on the side of a flat bed without using bedrails?: None Help needed moving to and from a bed to a chair (including a wheelchair)?: A Little Help needed standing up from a chair using your arms (e.g., wheelchair or bedside chair)?: None Help needed to walk in hospital room?: A Little Help needed climbing 3-5 steps with a railing? : A Little 6 Click Score: 21    End of Session Equipment Utilized During Treatment: Gait belt Activity Tolerance: Patient tolerated treatment well Patient left: in bed;with call bell/phone within reach Nurse Communication: Mobility status PT Visit Diagnosis: Unsteadiness on feet (R26.81);Muscle weakness (generalized) (M62.81);Difficulty in walking, not elsewhere classified (R26.2);Pain     Time: 5945-8592 PT Time Calculation (min) (ACUTE ONLY): 23 min  Charges:  $Gait Training: 8-22 mins $Therapeutic Activity: 8-22 mins                     Brynlee Pennywell P., PTA Acute Rehabilitation Services Pager: [phone] Office: [place] 04/28/2021, 5:56 PM

## 2021-04-28 NOTE — Progress Notes (Signed)
ANTICOAGULATION CONSULT NOTE - Follow Up Consult  Pharmacy Consult for Heparin Indication:  history of pulmonary embolus  Allergies  Allergen Reactions   Penicillin G Hives    Patient Measurements: Height: 5\' 4"  (162.6 cm) Weight: 94 kg (207 lb 3.7 oz) IBW/kg (Calculated) : 59.2 Heparin Dosing Weight: 80  Vital Signs: Temp: 97.5 F (36.4 C) (02/16 0750) Temp Source: Oral (02/16 0750) BP: 126/90 (02/16 0750) Pulse Rate: 94 (02/16 0750)  Labs: Recent Labs    04/26/21 0450 04/26/21 1458 04/27/21 0412 04/28/21 0405  HGB 8.4*  --  8.3*  --   HCT 25.5*  --  24.9*  --   PLT 390  --  406*  --   HEPARINUNFRC 0.24* 0.26* 0.36 0.32  CREATININE 1.02  --  1.11  --     Estimated Creatinine Clearance: 76.8 mL/min (by C-G formula based on SCr of 1.11 mg/dL).   Assessment: 56 yo male on IV heparin due to a history of PE(03/03/2019) while Eliquis is on hold, last Eliquis dose 2/1  RUQ drain placed 2/6 for pancreatic pseudocyst. Plan f/u CT today.  Heparin level remains therapeutic (0.32) on 2550 units/hr. No CBC today.  Goal of Therapy:  Heparin level 0.3-0.7 units/ml Monitor platelets by anticoagulation protocol: Yes   Plan:  Continue heparin drip at 2250 units/hr. Daily heparin level; daily CBC to resume 2/17. Eliquis on hold Follow up post-CT for restarting Eliquis if no further procedures.  Arty Baumgartner, RPh 04/28/2021,9:56 AM

## 2021-04-28 NOTE — Progress Notes (Signed)
Mobility Specialist Progress Note    04/28/21 1533  Mobility  Activity Ambulated with assistance in hallway  Level of Assistance Standby assist, set-up cues, supervision of patient - no hands on  Assistive Device  (IV pole)  Distance Ambulated (ft) 275 ft  Activity Response Tolerated fair  $Mobility charge 1 Mobility   Pt received in chair and agreeable. No complaints. Returned to bed with call bell in reach.   [facility] Mobility Specialist  M.S. 5N: [phone]

## 2021-04-28 NOTE — Progress Notes (Signed)
[facility] PROGRESS NOTE   Scott Foley MBE:[id number] DOB: 1965-07-09 DOA: 04/13/2021  15 DOS: the patient was seen and examined on 04/28/2021  PCP: [name], MD  Brief History and Hospital Course:  56 y.o. male with medical history significant of hx of CAD, HTN, hx of PE on eliquis, HLD, hxo of CVA in 2014, IDA with history of recent hospitalizaion on 03/16/21 for recurrent pancreatitis, sepsis due to bacteremia from klebsiella pneumoniae, covid-19 and respiratory failure requiring home oxygen on discharge.  Presented with a 4-day history of diffuse worsening abdominal pain.  He has had abdominal issues for the past 2 months.  He had a cholecystectomy on 12/21 and underwent ERCP on 12/22 since the intraoperative cholangiogram showed a CBD stone.  After ERCP he developed pancreatitis.  He was managed conservatively and then discharged home on 12/28.  He was readmitted to [facility] on 1/4 for recurrent pancreatitis and COVID-19.  Noted to have sepsis respiratory failure.  Blood cultures were positive for Klebsiella pneumonia.  He was treated with Invanz for 10 days.  Discharged on 2 L of oxygen. He had a postop visit with surgeon on 04/12/21 when he complained of abdominal pain.  A CT scan was subsequently done which showed pancreatic necrosis with gas within the pancreatic fluid collections.  He was sent over to [place] emergency department.  Interventional radiology was consulted for aspiration of the fluid collection which was performed on 2/6.  Plan for repeat CT scan on Wednesday/Thursday and then adjustment of abx after.   Calorie count is in progress.  Consultants: Gastroenterology.  Interventional radiology.  General surgery.  Infectious disease  Procedures: Drainage of fluid collection in the abdomen by interventional radiology on 2/6    Subjective: Patient denies any complaints.  States that he has some appetite.  He is trying to do his best with eating his  meals.  Denies any worsening abdominal pain nausea vomiting.     Assessment/Plan:   * Necrotizing pancreatitis- (present on admission) This is either a third episode of pancreatitis or sequelae of pancreatitis from his episode in January.  His lipase level was noted to be normal.  He does have significant leukocytosis.  CT scan was done at [facility].  Do not have access to that report but according to the H&P it showed progressive severe pancreatitis with extensive fluid collection surrounding the pancreas and extending into the right retroperitoneum.  Many of these fluid collections contained gas concerning for infection.  Compression of splenic vein and SMV was noted although they appear to be patent.  Patient was started on meropenem.   Gastroenterology consulted. General surgery (signed off) Interventional radiology consulted for percutaneous drainage which was done on 2/6  Diet was slowly advanced.  Calorie count is in progress.  Currently getting nocturnal tube feedings. CT scan to be repeated today.  Antibiotic regimen to be determined after CT scan is done.  Sepsis ([place])- (present on admission) On admission he met sepsis criteria with tachycardia, fever to 100.8, WBC to 18.2.  Recently noted to have Klebsiella bacteremia earlier in January when he was hospitalized in [place].  Blood cultures done here in the hospital have been negative so far. Sepsis physiology improved.  Patient remains afebrile.  WBC is elevated but stable.  Recheck labs tomorrow. Cultures from fluid sent after aspiration 2/6- kleb/citrobacter -ID consult for duration of abx-  Plan is to repeat CT abdomen pelvis.  Will order today.  Hypokalemia- (present on admission) Hypomagnesemia  Repleted.  Check every so often.  AKI (acute kidney injury) ([place])- (present on admission) Presented with creatinine of 2.49. This was likely prerenal in the setting of sepsis along with nausea and vomiting over the past few days.    Patient aggressively hydrated.  Renal function has improved and back to normal.  Normocytic anemia- (present on admission) Significant drop in hemoglobin noted most of which appears to be dilutional.   Anemia panel reviewed.  Vitamin B12 noted to be low normal at 254.  Being supplemented.  Folate 8.2.  Ferritin 1033.  TIBC 137.  Iron 16. He has been transfused 1 unit of PRBC during this hospital stay so far.   Hemoglobin low but stable.    History of pulmonary embolism- (present on admission) Eliquis is currently on hold.  He is on heparin infusion.   After CT Scan may be able to change back to eliquis if no further surgery planned.  Chronic respiratory failure with hypoxia ([place])- (present on admission) Recently recovered from COVID-19 infection.  Was discharged on 2 L of oxygen by nasal cannula.  Admission chest x-ray showed opacities which could be from his recent infection rather than any infection.   Respiratory status is stable.  Looks like he has been weaned off of oxygen.  Chronic obstructive pulmonary disease ([place])- (present on admission) No signs of exacerbation. Continue trelegy daily and albuterol prn.  Essential hypertension- (present on admission) Sinus tachycardia  ACE inhibitor on hold due to borderline low blood pressures. Sinus tachycardia likely secondary to severe illness. He was started back on beta-blocker due to concern for rebound tachycardia.  Heart rate is better. Blood pressure is reasonably well controlled  Hyperlipemia- (present on admission) Statin on hold currently.  Triglyceride was 121 in December.  Coronary artery disease with stable angina pectoris ([place])- (present on admission) Stable.  Plavix currently on hold.   Protein-calorie malnutrition, severe Started on tube feedings which he is tolerating well- transitioned to nocturnal tube feeds.  Continue to monitor. -calorie count ordered on 2/13 Nutrition Status: Nutrition Problem: Severe  Malnutrition Etiology: acute illness (pancreatitis) Signs/Symptoms: moderate muscle depletion, moderate fat depletion, energy intake < or equal to 50% for > or equal to 5 days, percent weight loss (11% weight loss within 3 months) Percent weight loss: 11 % Interventions: Tube feeding    Hypoglycemia Resolved with initiation of fluids and nutrition.   Obesity Estimated body mass index is 35.57 kg/m as calculated from the following:   Height as of this encounter: '5\' 4"'  (1.626 m).   Weight as of this encounter: 94 kg.    DVT Prophylaxis: On IV heparin infusion Code Status: Full code Family Communication: Discussed with patient.  We will update his wife later today Disposition Plan: Hopefully return home when improved  Status is: Inpatient  Remains inpatient appropriate because: Acute pancreatitis, infected intra-abdominal fluid collection       Medications: Scheduled:  feeding supplement  237 mL Oral BID BM   feeding supplement (PROSource TF)  45 mL Per Tube BID   feeding supplement (VITAL 1.5 CAL)  840 mL Per Tube Q24H   fluticasone furoate-vilanterol  1 puff Inhalation Daily   And   umeclidinium bromide  1 puff Inhalation Daily   free water  30 mL Per Tube Q4H   metoprolol tartrate  50 mg Oral BID   pantoprazole  40 mg Oral BID   sodium chloride flush  5 mL Intracatheter Q8H   vitamin B-12  1,000 mcg Per  Tube Daily   Continuous:  heparin 2,550 Units/hr (04/28/21 0938)   meropenem (MERREM) IV 1 g (04/28/21 1460)   QNV:VYXAJLUNGBMBO, albuterol, morphine injection, ondansetron **OR** ondansetron (ZOFRAN) IV, oxyCODONE  Antibiotics: Anti-infectives (From admission, onward)    Start     Dose/Rate Route Frequency Ordered Stop   04/16/21 1600  meropenem (MERREM) 1 g in sodium chloride 0.9 % 100 mL IVPB        1 g 200 mL/hr over 30 Minutes Intravenous Every 8 hours 04/16/21 1234     04/13/21 2000  meropenem (MERREM) 1 g in sodium chloride 0.9 % 100 mL IVPB  Status:   Discontinued        1 g 200 mL/hr over 30 Minutes Intravenous Every 12 hours 04/13/21 1934 04/16/21 1234   04/13/21 1800  ceFEPIme (MAXIPIME) 2 g in sodium chloride 0.9 % 100 mL IVPB  Status:  Discontinued        2 g 200 mL/hr over 30 Minutes Intravenous Every 12 hours 04/13/21 1731 04/13/21 1852   04/13/21 1715  levofloxacin (LEVAQUIN) IVPB 750 mg  Status:  Discontinued        750 mg 100 mL/hr over 90 Minutes Intravenous  Once 04/13/21 1713 04/13/21 1728   04/13/21 1715  metroNIDAZOLE (FLAGYL) IVPB 500 mg  Status:  Discontinued        500 mg 100 mL/hr over 60 Minutes Intravenous  Once 04/13/21 1713 04/13/21 1852       Objective:  Vital Signs  Vitals:   04/27/21 1936 04/28/21 0444 04/28/21 0750 04/28/21 0825  BP: 117/83 123/89 126/90   Pulse: (!) 110  94   Resp: (!) 23 (!) 22 17   Temp: 97.7 F (36.5 C) 97.9 F (36.6 C) (!) 97.5 F (36.4 C)   TempSrc: Oral Oral Oral   SpO2: 96%  94% 93%  Weight:      Height:        Intake/Output Summary (Last 24 hours) at 04/28/2021 0940 Last data filed at 04/27/2021 1849 Gross per 24 hour  Intake 120 ml  Output 615 ml  Net -495 ml    Filed Weights   04/22/21 0454 04/24/21 0500 04/25/21 0500  Weight: 90.3 kg 94 kg 94 kg    General appearance: Awake alert.  In no distress Resp: Clear to auscultation bilaterally.  Normal effort Cardio: S1-S2 is normal regular.  No S3-S4.  No rubs murmurs or bruit GI: Abdomen is soft.  Mildly tender in the epigastric area.  Drain is noted in the right abdomen. Extremities: Moving all extremities Neurologic: Alert and oriented x3.  No focal neurological deficits.     Lab Results:  Data Reviewed: I have personally reviewed labs and imaging study reports  CBC: Recent Labs  Lab 04/22/21 0335 04/23/21 0104 04/24/21 0320 04/26/21 0450 04/27/21 0412  WBC 15.5* 18.2* 16.5* 17.2* 16.5*  HGB 7.4* 7.5* 8.0* 8.4* 8.3*  HCT 23.4* 23.3* 25.4* 25.5* 24.9*  MCV 88.6 87.3 87.0 85.3 85.6  PLT 391 391  380 390 406*     Basic Metabolic Panel: Recent Labs  Lab 04/22/21 0335 04/23/21 0104 04/24/21 0320 04/26/21 0450 04/27/21 0412  NA 135 135 135 133* 132*  K 3.9 4.3 4.1 4.1 4.0  CL 102 101 101 100 100  CO2 '25 25 24 ' 21* 23  GLUCOSE 130* 110* 103* 104* 120*  BUN '16 18 20 ' 24* 27*  CREATININE 0.82 0.79 0.97 1.02 1.11  CALCIUM 8.0* 8.2* 8.4* 8.9 8.9  GFR: Estimated Creatinine Clearance: 76.8 mL/min (by C-G formula based on SCr of 1.11 mg/dL).   CBG: Recent Labs  Lab 04/27/21 1614 04/27/21 1948 04/27/21 2330 04/28/21 0629 04/28/21 0752  GLUCAP 91 112* 117* 119* 95      Recent Results (from the past 240 hour(s))  Aerobic/Anaerobic Culture w Gram Stain (surgical/deep wound)     Status: None   Collection Time: 04/18/21 10:01 AM   Specimen: Abscess  Result Value Ref Range Status   Specimen Description ABSCESS  Final   Special Requests DRAIN  Final   Gram Stain   Final    MODERATE WBC PRESENT, PREDOMINANTLY MONONUCLEAR NO ORGANISMS SEEN    Culture   Final    ABUNDANT KLEBSIELLA PNEUMONIAE ABUNDANT CITROBACTER FREUNDII NO ANAEROBES ISOLATED Performed at [facility], [place] [address] [place], [place] [address]    Report Status 04/23/2021 FINAL  Final   Organism ID, Bacteria KLEBSIELLA PNEUMONIAE  Final   Organism ID, Bacteria CITROBACTER FREUNDII  Final      Susceptibility   Citrobacter freundii - MIC*    CEFAZOLIN >=64 RESISTANT Resistant     CEFEPIME <=0.12 SENSITIVE Sensitive     CEFTAZIDIME <=1 SENSITIVE Sensitive     CEFTRIAXONE <=0.25 SENSITIVE Sensitive     CIPROFLOXACIN <=0.25 SENSITIVE Sensitive     GENTAMICIN <=1 SENSITIVE Sensitive     IMIPENEM <=0.25 SENSITIVE Sensitive     TRIMETH/SULFA <=20 SENSITIVE Sensitive     PIP/TAZO <=4 SENSITIVE Sensitive     * ABUNDANT CITROBACTER FREUNDII   Klebsiella pneumoniae - MIC*    AMPICILLIN >=32 RESISTANT Resistant     CEFAZOLIN <=4 SENSITIVE Sensitive     CEFEPIME <=0.12 SENSITIVE Sensitive      CEFTAZIDIME <=1 SENSITIVE Sensitive     CEFTRIAXONE <=0.25 SENSITIVE Sensitive     CIPROFLOXACIN <=0.25 SENSITIVE Sensitive     GENTAMICIN <=1 SENSITIVE Sensitive     IMIPENEM <=0.25 SENSITIVE Sensitive     TRIMETH/SULFA <=20 SENSITIVE Sensitive     AMPICILLIN/SULBACTAM 4 SENSITIVE Sensitive     PIP/TAZO <=4 SENSITIVE Sensitive     * ABUNDANT KLEBSIELLA PNEUMONIAE       Radiology Studies: No results found.     LOS: 15 days   Novalie Leamy [employer] on [email or web address]  04/28/2021, 9:40 AM

## 2021-04-28 NOTE — Plan of Care (Signed)

## 2021-04-28 NOTE — Progress Notes (Signed)
Echocardiogram Prelim Findings Limited TTE to evaluate pericardial effusion LV appears grossly normal in size/systolic function. LVEF>55% There is a large, circumferential pericardial effusion measuring ~3.0cm at maxiumum depth. There is evidence of systolic RA collapse, diastolic RV collapse, mildly dilated IVC (2.2cm; pt unable to "sniff" due to NGT), but <25% variation in mitral inflow.  Echo signs are consistent w/ early or impending tamponade physiology.  Findings discussed with ordering provider. Full report to follow in the AM  Rudean Curt, MD , [facility] 04/28/21 11:08 PM

## 2021-04-28 NOTE — Progress Notes (Signed)
Referring Physician(s): Dr. Maryland Pink  Supervising Physician: Mir, Sharen Heck  Patient Status:  [facility] - In-pt  Chief Complaint: Pancreatic pseudocyst s/p RLQ drain placement in IR 04/18/21.   Subjective: Patient sitting up in chair, no discomfort or distress observed. He denies any pain or discomfort. He states he is waiting to go down for CT scan.   Allergies: Penicillin g  Medications: Prior to Admission medications   Medication Sig Start Date End Date Taking? Authorizing Provider  albuterol (VENTOLIN HFA) 108 (90 Base) MCG/ACT inhaler Inhale 2 puffs into the lungs every 6 (six) hours as needed for wheezing or shortness of breath. 04/04/21  Yes Rip Harbour, NP  apixaban (ELIQUIS) 5 MG TABS tablet Take 1 tablet (5 mg total) by mouth 2 (two) times daily. 04/01/21  Yes Rip Harbour, NP  clopidogrel (PLAVIX) 75 MG tablet Take 1 tablet (75 mg total) by mouth daily. 04/01/21  Yes Rip Harbour, NP  Evolocumab (REPATHA) 140 MG/ML SOSY Inject 140 mg into the skin every 14 (fourteen) days. 12/30/20  Yes [name], [name], MD  Fluticasone-Umeclidin-Vilant (TRELEGY ELLIPTA) 100-62.5-25 MCG/ACT AEPB Inhale 1 puff into the lungs daily. 04/04/21  Yes Rip Harbour, NP  Ibuprofen-diphenhydrAMINE Cit (ADVIL PM PO) Take 1 tablet by mouth at bedtime.   Yes [provider]  isosorbide mononitrate (IMDUR) 60 MG 24 hr tablet Take 1 tablet (60 mg total) by mouth daily. 04/01/21  Yes Rip Harbour, NP  lisinopril (ZESTRIL) 40 MG tablet Take 1 tablet (40 mg total) by mouth daily. 04/01/21  Yes Rip Harbour, NP  metoprolol tartrate (LOPRESSOR) 50 MG tablet Take 1 tablet (50 mg total) by mouth 2 (two) times daily. 04/01/21  Yes Rip Harbour, NP  Omega-3 Fatty Acids (FISH OIL) 1000 MG CAPS Take 1 capsule (1,000 mg total) by mouth 2 (two) times daily. 11/30/20  Yes [name], [name], MD  pantoprazole (PROTONIX) 40 MG tablet Take 1 tablet (40 mg total) by mouth daily. Patient taking  differently: Take 40 mg by mouth 2 (two) times daily. 02/15/21  Yes Rip Harbour, NP  rosuvastatin (CRESTOR) 40 MG tablet Take 1 tablet (40 mg total) by mouth daily. Patient taking differently: Take 40 mg by mouth at bedtime. 04/01/21  Yes Rip Harbour, NP     Vital Signs: BP 122/89 (BP Location: Left Arm)    Pulse 92    Temp (!) 97.5 F (36.4 C) (Oral)    Resp 17    Ht 5\' 4"  (1.626 m)    Wt 207 lb 3.7 oz (94 kg)    SpO2 97%    BMI 35.57 kg/m   Physical Exam Constitutional:      General: He is not in acute distress.    Appearance: He is not ill-appearing.  HENT:     Mouth/Throat:     Mouth: Mucous membranes are moist.     Pharynx: Oropharynx is clear.  Pulmonary:     Effort: Pulmonary effort is normal.  Abdominal:     Palpations: Abdomen is soft.     Tenderness: There is no abdominal tenderness.     Comments: RLQ drain to gravity. Site easily flushes/aspirates. Approximately 25 ml of tan/purulent fluid in bag. Dressing is clean/dry  Skin:    General: Skin is warm and dry.  Neurological:     Mental Status: He is alert and oriented to person, place, and time.    Imaging: No results found.  Labs:  CBC: Recent Labs  04/23/21 0104 04/24/21 0320 04/26/21 0450 04/27/21 0412  WBC 18.2* 16.5* 17.2* 16.5*  HGB 7.5* 8.0* 8.4* 8.3*  HCT 23.3* 25.4* 25.5* 24.9*  PLT 391 380 390 406*    COAGS: Recent Labs    04/13/21 1523 04/14/21 0230 04/15/21 0153 04/15/21 0909 04/15/21 2008 04/16/21 0649 04/17/21 0341  INR 2.4*  --  1.6*  --   --  1.4* 1.5*  APTT 52*   < >  --  62* 69* 74* 65*   < > = values in this interval not displayed.    BMP: Recent Labs    04/23/21 0104 04/24/21 0320 04/26/21 0450 04/27/21 0412  NA 135 135 133* 132*  K 4.3 4.1 4.1 4.0  CL 101 101 100 100  CO2 25 24 21* 23  GLUCOSE 110* 103* 104* 120*  BUN 18 20 24* 27*  CALCIUM 8.2* 8.4* 8.9 8.9  CREATININE 0.79 0.97 1.02 1.11  GFRNONAA >60 >60 >60 >60    LIVER FUNCTION  TESTS: Recent Labs    04/15/21 0153 04/16/21 0649 04/17/21 0637 04/18/21 0251  BILITOT 0.6 0.3 0.2* 0.4  AST 17 18 16 24   ALT 18 17 15 17   ALKPHOS 98 92 67 77  PROT 5.6* 5.8* 5.3* 4.8*  ALBUMIN 1.7* 1.7* 1.5* <1.5*    Assessment and Plan:  Pancreatic pseudocyst s/p RLQ drain placement in IR 04/18/21 Patient is afebrile, WBC count of 16.5 is stable compared to prior values. 40 ml documented output with another 25 ml in gravity bag during assessment.   Drain Location: RLQ Size: Fr size: 12 Fr Date of placement: 04/18/21 Currently to: Drain collection device: gravity 24 hour output:  Output by Drain (mL) 04/26/21 0701 - 04/26/21 1900 04/26/21 1901 - 04/27/21 0700 04/27/21 0701 - 04/27/21 1900 04/27/21 1901 - 04/28/21 0700 04/28/21 0701 - 04/28/21 1433  Closed System Drain 1 Right RLQ Other (Comment) 12 Fr. 5  40      Interval imaging/drain manipulation:  Pending CT scan today   Current examination: Flushes/aspirates easily.  Insertion site unremarkable. Suture and stat lock in place. Dressed appropriately.   Plan: Continue TID flushes with 5 cc NS. Record output Q shift. Dressing changes QD or PRN if soiled.  Call IR APP or on call IR MD if difficulty flushing or sudden change in drain output.  Repeat imaging/possible drain injection once output < 10 mL/QD (excluding flush material.)  Discharge planning: Please contact IR APP or on call IR MD prior to patient d/c to ensure appropriate follow up plans are in place. Typically patient will follow up with IR clinic 10-14 days post d/c for repeat imaging/possible drain injection. IR scheduler will contact patient with date/time of appointment. Patient will need to flush drain QD with 5 cc NS, record output QD, dressing changes every 2-3 days or earlier if soiled.   IR will continue to follow - please call with questions or concerns.  Electronically Signed: Soyla Dryer, AGACNP-BC [phone] 04/28/2021, 2:20 PM   I  spent a total of 15 Minutes at the the patient's bedside AND on the patient's hospital floor or unit, greater than 50% of which was counseling/coordinating care for RLQ drain.

## 2021-04-28 NOTE — Progress Notes (Signed)
2D echo completed at the bedside. Patient denies any complaints and resting comfortably at this time. Every 2 hours vital signs check initiated. Will continue to monitor pt.

## 2021-04-28 NOTE — Significant Event (Signed)
[facility] night coverage note: CT scan showing "Interval development of a moderate pericardial effusion demonstrating complex features with pericardial enhancement and evidence of cardiac tamponade with mild relative leftward shift of the intraventricular septum when compared to prior examination of 04/18/2021. Correlation with echocardiography is recommended."  Pt seen at bedside, no new complaints.  Pt does have S.tach to 115 on the monitor at time of my exam. Pt does NOT have pulsus paradoxus on my exam.  Spoke with Dr. Alveta Heimlich (cardiology): She has contacted Echo tech to come in and perform 2d echo tonight.

## 2021-04-29 DIAGNOSIS — I3139 Other pericardial effusion (noninflammatory): Secondary | ICD-10-CM

## 2021-04-29 DIAGNOSIS — I10 Essential (primary) hypertension: Secondary | ICD-10-CM | POA: Diagnosis not present

## 2021-04-29 DIAGNOSIS — D649 Anemia, unspecified: Secondary | ICD-10-CM | POA: Diagnosis not present

## 2021-04-29 DIAGNOSIS — I314 Cardiac tamponade: Secondary | ICD-10-CM | POA: Diagnosis not present

## 2021-04-29 DIAGNOSIS — Z86711 Personal history of pulmonary embolism: Secondary | ICD-10-CM | POA: Diagnosis not present

## 2021-04-29 DIAGNOSIS — K8591 Acute pancreatitis with uninfected necrosis, unspecified: Secondary | ICD-10-CM | POA: Diagnosis not present

## 2021-04-29 HISTORY — DX: Other pericardial effusion (noninflammatory): I31.39

## 2021-04-29 LAB — CBC
HCT: 24.7 % — ABNORMAL LOW (ref 39.0–52.0)
Hemoglobin: 8 g/dL — ABNORMAL LOW (ref 13.0–17.0)
MCH: 28 pg (ref 26.0–34.0)
MCHC: 32.4 g/dL (ref 30.0–36.0)
MCV: 86.4 fL (ref 80.0–100.0)
Platelets: 412 10*3/uL — ABNORMAL HIGH (ref 150–400)
RBC: 2.86 MIL/uL — ABNORMAL LOW (ref 4.22–5.81)
RDW: 17.5 % — ABNORMAL HIGH (ref 11.5–15.5)
WBC: 15.2 10*3/uL — ABNORMAL HIGH (ref 4.0–10.5)
nRBC: 0 % (ref 0.0–0.2)

## 2021-04-29 LAB — GLUCOSE, CAPILLARY
Glucose-Capillary: 100 mg/dL — ABNORMAL HIGH (ref 70–99)
Glucose-Capillary: 104 mg/dL — ABNORMAL HIGH (ref 70–99)
Glucose-Capillary: 104 mg/dL — ABNORMAL HIGH (ref 70–99)
Glucose-Capillary: 123 mg/dL — ABNORMAL HIGH (ref 70–99)
Glucose-Capillary: 87 mg/dL (ref 70–99)
Glucose-Capillary: 88 mg/dL (ref 70–99)

## 2021-04-29 LAB — BASIC METABOLIC PANEL
Anion gap: 10 (ref 5–15)
BUN: 28 mg/dL — ABNORMAL HIGH (ref 6–20)
CO2: 22 mmol/L (ref 22–32)
Calcium: 9 mg/dL (ref 8.9–10.3)
Chloride: 100 mmol/L (ref 98–111)
Creatinine, Ser: 1.03 mg/dL (ref 0.61–1.24)
GFR, Estimated: >60 mL/min (ref >60)
Glucose, Bld: 115 mg/dL — ABNORMAL HIGH (ref 70–99)
Potassium: 4.2 mmol/L (ref 3.5–5.1)
Sodium: 132 mmol/L — ABNORMAL LOW (ref 135–145)

## 2021-04-29 LAB — ECHOCARDIOGRAM LIMITED
Height: 64 in
S' Lateral: 2.9 cm
Weight: 3315.72 oz

## 2021-04-29 LAB — HEPARIN LEVEL (UNFRACTIONATED): Heparin Unfractionated: 0.33 IU/mL (ref 0.30–0.70)

## 2021-04-29 LAB — C-REACTIVE PROTEIN: CRP: 11.1 mg/dL — ABNORMAL HIGH (ref <1.0)

## 2021-04-29 MED ORDER — ADULT MULTIVITAMIN W/MINERALS CH
1.0000 | ORAL_TABLET | Freq: Every day | ORAL | Status: DC
  Administered 2021-04-29 – 2021-05-10 (×12): 1 via ORAL
  Filled 2021-04-29 (×12): qty 1

## 2021-04-29 MED ORDER — PROSOURCE PLUS PO LIQD
30.0000 mL | Freq: Two times a day (BID) | ORAL | Status: DC
  Administered 2021-05-01: 30 mL via ORAL
  Filled 2021-04-29 (×4): qty 30

## 2021-04-29 NOTE — Progress Notes (Signed)
Nutrition Follow-up  DOCUMENTATION CODES:  Severe malnutrition in context of acute illness/injury  INTERVENTION:  -d/c Cortrak and tube feeding orders -MVI with minerals daily -PROSource PLUS PO 50mls BID, each supplement provides 100 kcals and 15 grams of protein -continue Ensure Enlive po BID, each supplement provides 350 kcal and 20 grams of protein.  NUTRITION DIAGNOSIS:  Severe Malnutrition related to acute illness (pancreatitis) as evidenced by moderate muscle depletion, moderate fat depletion, energy intake < or equal to 50% for > or equal to 5 days, percent weight loss (11% weight loss within 3 months). - ongoing  GOAL:  Patient will meet greater than or equal to 90% of their needs - progressing  MONITOR:  I & O's, Labs, Diet advancement  REASON FOR ASSESSMENT:  New TF    ASSESSMENT:  56 yo male admitted with severe acute pancreatitis with sepsis, AKI. PMH includes CAD, HTN, PE, HLD, CVA, IDA, pancreatitis, chronic respiratory failure.  2/03 - Cortrak placed then advanced by fluoroscopy into the distal duodenum/proximal jejunum 2/06 - IR consulted for percutaneous drainage 2/07 - diet advanced to full liquids  2/10 - diet advanced to Heart Healthy 2/16 - CT scan of the abdomen pelvis was repeated, showed stable findings   Per MD, pt incidentally noted to have significant pericardial effusion with features concerning for tamponade.  Cardiology was consulted.  Urgent echocardiogram was performed today.  Pt states that he feels well and is "trying to eat better." Five meals documented since last RD visit with 50-100% meal completion (85% avg intake). Given improved intake and pt typically doing well with ONS, will recommend d/c Cortrak and nocturnal TF at this time.   Admit wt: 84.8 kg Current wt: 94 kg   UOP: 924ml x24 hours Drain output: 75ml x24 hours I/O: +19.9L since admit   Medications: Ensure Enlive, protonix, vitamin B12 Labs: Na 132 (L) CBGs: 98-123 x24  hours    Diet Order:   Diet Order             Diet Heart Room service appropriate? Yes; Fluid consistency: Thin  Diet effective now                  EDUCATION NEEDS:  Not appropriate for education at this time  Skin:  Skin Assessment: Reviewed RN Assessment  Last BM:  2/15  Height:  Ht Readings from Last 1 Encounters:  04/19/21 5\' 4"  (1.626 m)   Weight:  Wt Readings from Last 1 Encounters:  04/25/21 94 kg   BMI:  Body mass index is 35.57 kg/m.  Estimated Nutritional Needs:  Kcal:  2300-2500 Protein:  120 gm Fluid:  2.3-2.5 L    Theone Stanley., MS, RD, LDN (she/her/hers) RD pager number and weekend/on-call pager number located in [place].

## 2021-04-29 NOTE — Progress Notes (Signed)
Occupational Therapy Treatment Patient Details Name: Scott Foley MRN: [id number] DOB: August 22, 1965 Today's Date: 04/29/2021   History of present illness Pt is a 56 y.o. M who presents 04/13/2021 with abdominal pain. Admitted with acute pancreatitis with sepsis. S/p lap chole 03/02/2021 with abnormal IOC; underwent ERCP 12/22/222. S/p IR pec drain. Significant PMH: CAD, HTN, HLD, COPD, PE, CVA, IDA, tobacco abuse.   OT comments  Pt. Seen for skilled OT treatment session.  Pt. Able to demonstrate LB dressing/adls.  Reports no issues with short distance ambulation for toileting tasks.  Reviewed energy conservation strategies.  No further questions/concerns.  States he has no current acute OT needs and feels prepared for home when able with prn support from wife.  Will alert OTR/l to sign off per pt. Request.     Recommendations for follow up therapy are one component of a multi-disciplinary discharge planning process, led by the attending physician.  Recommendations may be updated based on patient status, additional functional criteria and insurance authorization.    Follow Up Recommendations  No OT follow up    Assistance Recommended at Discharge Set up Supervision/Assistance  Patient can return home with the following  A little help with walking and/or transfers;A little help with bathing/dressing/bathroom;Assistance with cooking/housework;Assist for transportation;Help with stairs or ramp for entrance   Equipment Recommendations  None recommended by OT;Other (comment)    Recommendations for Other Services      Precautions / Restrictions Precautions Precautions: Fall;Other (comment) Precaution Comments: RLQ drain       Mobility Bed Mobility                    Transfers                         Balance                                           ADL either performed or assessed with clinical judgement   ADL Overall ADL's : Needs  assistance/impaired                     Lower Body Dressing: Set up;Sitting/lateral leans Lower Body Dressing Details (indicate cue type and reason): uses figure 4 for socks   Toilet Transfer Details (indicate cue type and reason): cna and pt. report pt. has just returned from b.room and only requires assistance for tube/line management           General ADL Comments: pt. reports feeling well. no physical assistance required for lb adls, also states no noted issues with toileting tasks. feels good from adl standpoint no questions or concerns. states wife available to assist prn at home.    Extremity/Trunk Assessment              Vision       Perception     Praxis      Cognition Arousal/Alertness: Awake/alert Behavior During Therapy: WFL for tasks assessed/performed, Flat affect Overall Cognitive Status: Within Functional Limits for tasks assessed                                          Exercises      Shoulder Instructions       General Comments  Pertinent Vitals/ Pain       Pain Assessment Pain Assessment: 0-10 Pain Score: 2  Pain Location: cortrak related discomfort, abd and BLE mild soreness Pain Descriptors / Indicators: Discomfort, Sore  Home Living                                          Prior Functioning/Environment              Frequency  Min 2X/week        Progress Toward Goals  OT Goals(current goals can now be found in the care plan section)  Progress towards OT goals: Goals met/education completed, patient discharged from OT;Progressing toward goals     Plan All goals met and education completed, patient discharged from OT services    Co-evaluation                 AM-PAC OT "6 Clicks" Daily Activity     Outcome Measure   Help from another person eating meals?: None Help from another person taking care of personal grooming?: None Help from another person toileting,  which includes using toliet, bedpan, or urinal?: None Help from another person bathing (including washing, rinsing, drying)?: A Little Help from another person to put on and taking off regular upper body clothing?: A Little Help from another person to put on and taking off regular lower body clothing?: A Little 6 Click Score: 21    End of Session    OT Visit Diagnosis: Unsteadiness on feet (R26.81);Other abnormalities of gait and mobility (R26.89);Muscle weakness (generalized) (M62.81)   Activity Tolerance Patient tolerated treatment well   Patient Left in chair;with call bell/phone within reach   Nurse Communication          Time: 8502-7741 OT Time Calculation (min): 10 min  Charges: OT General Charges $OT Visit: 1 Visit OT Treatments $Self Care/Home Management : 8-22 mins  Sonia Baller, COTA/L Acute Rehabilitation [phone]   Tanya Nones 04/29/2021, 12:51 PM

## 2021-04-29 NOTE — Progress Notes (Signed)
[facility]  Date of Admission:  04/13/2021           Reason for visit: Follow up on necrotizing pancreatitis  Current antibiotics: Meropenem  ASSESSMENT:    56 y.o. male admitted with:  Necrotizing pancreatitis with abscess formation: Status post IR drain placement on 04/18/2021 with cultures isolating Klebsiella pneumonia and Citrobacter freundii.  Continues on meropenem.  24-hour drain output measured as 50 cc.  He underwent repeat CT yesterday which noted evolving changes of necrotizing pancreatitis.  Size of collection that underwent percutaneous drainage has decreased in size measuring 2.9 x 3.6 cm at this time.  Other areas of walled off necrosis have demonstrated slight interval decrease since his prior imaging. Pericardial effusion: Incidentally noted last night on CT scan.  Cardiology suspects the most likely cause is neighboring inflammation from pancreatitis and lymphatic drainage obstruction.  Plan to monitor with serial echocardiograms but currently no plans for urgent pericardiocentesis.   RECOMMENDATIONS:    Continue meropenem as is Monitor IR drain output  Will follow.  Available as needed over the weekend, otherwise, will return on Monday   Principal Problem:   Necrotizing pancreatitis Active Problems:   Coronary artery disease with stable angina pectoris (HCC)   Hyperlipemia   Chronic obstructive pulmonary disease (HCC)   Essential hypertension   History of pulmonary embolism   Sepsis ([place])   AKI (acute kidney injury) ([place])   Hypokalemia   Chronic respiratory failure with hypoxia (HCC)   Normocytic anemia   Hypoglycemia   Protein-calorie malnutrition, severe    MEDICATIONS:    Scheduled Meds:  feeding supplement  237 mL Oral BID BM   feeding supplement (PROSource TF)  45 mL Per Tube BID   feeding supplement (VITAL 1.5 CAL)  840 mL Per Tube Q24H   fluticasone furoate-vilanterol  1 puff Inhalation Daily   And    umeclidinium bromide  1 puff Inhalation Daily   free water  30 mL Per Tube Q4H   metoprolol tartrate  50 mg Oral BID   pantoprazole  40 mg Oral BID   sodium chloride flush  5 mL Intracatheter Q8H   vitamin B-12  1,000 mcg Per Tube Daily   Continuous Infusions:  heparin 2,550 Units/hr (04/29/21 0807)   meropenem (MERREM) IV 1 g (04/29/21 0810)   PRN Meds:.acetaminophen, albuterol, morphine injection, ondansetron **OR** ondansetron (ZOFRAN) IV, oxyCODONE  SUBJECTIVE:   24 hour events:  Repeat imaging done yesterday evening noted an incidental finding of a pericardial effusion.  Seen by cardiology. Afebrile, Tmax 98.3 Normotensive, slightly tachycardic WBC stable at 15.2 Creatinine 1.03 No new micro  Patient reports no new complaints this morning.  He is sitting up in the bed watching TV.  He reports he is tolerating oral intake without significant abdominal pain.  No chest pain, no shortness of breath.   Review of Systems  All other systems reviewed and are negative.    OBJECTIVE:   Blood pressure 126/88, pulse (!) 110, temperature 98.3 F (36.8 C), temperature source Oral, resp. rate 20, height 5\' 4"  (1.626 m), weight 94 kg, SpO2 96 %. Body mass index is 35.57 kg/m.  Physical Exam Constitutional:      General: He is not in acute distress.    Comments: [name], sitting up in the chair, no acute distress  HENT:     Head: Normocephalic and atraumatic.     Nose:     Comments: NG tube in  place Eyes:     Extraocular Movements: Extraocular movements intact.     Conjunctiva/sclera: Conjunctivae normal.  Cardiovascular:     Rate and Rhythm: Regular rhythm. Tachycardia present.  Pulmonary:     Effort: Pulmonary effort is normal. No respiratory distress.     Breath sounds: Normal breath sounds.  Abdominal:     General: There is no distension.     Palpations: Abdomen is soft.     Comments: IR drain in place  Musculoskeletal:        General: Normal  range of motion.     Cervical back: Normal range of motion and neck supple.     Right lower leg: No edema.     Left lower leg: No edema.  Skin:    General: Skin is warm and dry.     Findings: No rash.  Neurological:     General: No focal deficit present.     Mental Status: He is alert and oriented to person, place, and time.  Psychiatric:        Mood and Affect: Mood normal.        Behavior: Behavior normal.     Lab Results: Lab Results  Component Value Date   WBC 15.2 (H) 04/29/2021   HGB 8.0 (L) 04/29/2021   HCT 24.7 (L) 04/29/2021   MCV 86.4 04/29/2021   PLT 412 (H) 04/29/2021    Lab Results  Component Value Date   NA 132 (L) 04/29/2021   K 4.2 04/29/2021   CO2 22 04/29/2021   GLUCOSE 115 (H) 04/29/2021   BUN 28 (H) 04/29/2021   CREATININE 1.03 04/29/2021   CALCIUM 9.0 04/29/2021   GFRNONAA >60 04/29/2021   GFRAA 105 12/15/2019    Lab Results  Component Value Date   ALT 17 04/18/2021   AST 24 04/18/2021   ALKPHOS 77 04/18/2021   BILITOT 0.4 04/18/2021       Component Value Date/Time   CRP 12.6 (H) 04/27/2021 1204    No results found for: ESRSEDRATE   I have reviewed the micro and lab results in Epic.  Imaging: CT ABDOMEN PELVIS W CONTRAST  Addendum Date: 04/28/2021   ADDENDUM REPORT: 04/28/2021 19:43 ADDENDUM: These results were called by telephone at the time of interpretation on 04/28/2021 at 7:39 pm to provider Dr. Olena Heckle, Who verbally acknowledged these results. Electronically Signed   By: Fidela Salisbury M.D.   On: 04/28/2021 19:43   Result Date: 04/28/2021 CLINICAL DATA:  Necrotizing pancreatitis, follow-up examination EXAM: CT ABDOMEN AND PELVIS WITH CONTRAST TECHNIQUE: Multidetector CT imaging of the abdomen and pelvis was performed using the standard protocol following bolus administration of intravenous contrast. RADIATION DOSE REDUCTION: This exam was performed according to the departmental dose-optimization program which includes automated  exposure control, adjustment of the mA and/or kV according to patient size and/or use of iterative reconstruction technique. CONTRAST:  135mL OMNIPAQUE IOHEXOL 300 MG/ML  SOLN COMPARISON:  04/18/2021 FINDINGS: Lower chest: Moderate, complex appearing pericardial effusion demonstrating subtle pericardial enhancement has developed with evidence of cardiac tamponade and relative leftward shift of the interventricular septum when compared to prior examination. Small bilateral pleural effusions are again identified with associated bibasilar compressive atelectasis. Nodular densities within the subpleural right middle lobe are unchanged. Hepatobiliary: No focal liver abnormality is seen. Status post cholecystectomy. No biliary dilatation. Mild pneumobilia within the nondependent left hepatic lobe again noted. Pancreas: Findings in keeping with necrotizing pancreatitis are again identified with heterogeneous enhancement the pancreatic parenchyma best appreciated within  the head and body of the pancreas, stable since prior examination. Acute peripancreatic necrotic collections adjacent to the mid body of the pancreas at axial image # 36/3 measuring 3.9 x 2.9 cm appear better defined though a discrete capsule is not clearly identified to suggest walled off necrosis at this time. However, more loculated gas and complex fluid containing collections adjacent to the head of the pancreas measuring 3.0 x 3.9 cm at axial image # 38/3 and 2.2 x 3.8 cm at axial image # 47/3 now demonstrate more well-defined enhancing margins and are compatible with areas of walled-off necrosis. As noted previously, super infection is not excluded. Smaller peripancreatic acute necrotic collections adjacent to the tail of the pancreas appear relatively stable since prior examination. Inflammatory changes are again identified tracking into the pericolic gutters bilaterally. Spleen: Unremarkable.  The splenic vein is patent. Adrenals/Urinary Tract:  Adrenal glands are unremarkable. Kidneys are normal, without renal calculi, focal lesion, or hydronephrosis. Bladder is unremarkable. Stomach/Bowel: Nasoenteric feeding tube tip seen within the proximal jejunum beyond the mid lumen of [place]. Mild ascending colonic diverticulosis. The stomach, small bowel, and large bowel are otherwise unremarkable there is no evidence of obstruction or focal inflammation. Appendix normal. No free intraperitoneal gas or fluid. Vascular/Lymphatic: Previously noted narrowing of the portosplenic confluence, best appreciated on axial image # 36/3, appears improved since prior examination. The splenic vein, superior mesenteric vein, and portal vein appear patent. Extensive aortoiliac atherosclerotic calcification. Focal excrescence of the infrarenal abdominal aorta measuring 3.4 cm in transaxial dimension at axial image # 41/3, is stable possibly representing a a thrombosed penetrating atherosclerotic ulcer or focal dissection. This is unchanged since remote prior examination of 03/03/2019. No pathologic adenopathy within the abdomen and pelvis. Reproductive: Prostate is unremarkable. Other: The previously noted area of walled-off necrosis within the right retroperitoneum within the iliac fossa has undergone percutaneous drainage with a cope loop catheter well position within the residual collection. The collection has decreased in size, now measuring 2.9 x 3.6 cm at axial image # 61/3. Tiny right fat containing inguinal hernia. Mild subcutaneous edema noted within the flanks bilaterally. Musculoskeletal: No acute bone abnormality. No lytic or blastic bone lesions are identified. Stable remote L1 compression fracture IMPRESSION: Interval development of a moderate pericardial effusion demonstrating complex features with pericardial enhancement and evidence of cardiac tamponade with mild relative leftward shift of the intraventricular septum when compared to prior examination of  04/18/2021. Correlation with echocardiography is recommended. Stable changes of anasarca with small bilateral pleural effusions and mild subcutaneous edema within the flanks bilaterally. Evolving changes of necrotizing pancreatitis. Stable enhancement of the residual pancreatic parenchyma. Increasing organization peripancreatic necrosis adjacent to the head and body of the pancreas with areas of more formal walled-off necrosis adjacent to the pancreatic head demonstrating slight interval decrease in size since prior examination. Intraluminal gas within areas of walled-off necrosis are again identified and superinfection is not excluded. Interval percutaneous drainage of area retroperitoneal walled-off necrosis within the right iliac fossa with interval decrease in size of loculated collection. Improved mass effect upon the portosplenic confluence with wide patency of the superior mesenteric vein, splenic vein, and portal vein. Attempts are being made at this time to contact the patient is managing clinician for direct verbal communication of these findings. Electronically Signed: By: Fidela Salisbury M.D. On: 04/28/2021 19:25   ECHOCARDIOGRAM LIMITED  Result Date: 04/29/2021    ECHOCARDIOGRAM REPORT   Patient Name:   Scott Foley Date of Exam: 04/28/2021 Medical Rec #:  [id number]             Height:       64.0 in Accession #:    [id number]            Weight:       207.2 lb Date of Birth:  October 11, 1965             BSA:          1.986 m Patient Age:    77 years              BP:           122/89 mmHg Patient Gender: M                     HR:           109 bpm. Exam Location:  Inpatient Procedure: Limited Echo, Cardiac Doppler and Limited Color Doppler                     STAT ECHO Reported to: Dr. Hassell Done on 04/28/2021 10:20:00 PM. Indications:    cardiac tamponade  History:        Patient has prior history of Echocardiogram examinations, most                 recent 02/09/2021. CAD, COPD; Risk  Factors:Dyslipidemia.  Sonographer:    Johny Chess RDCS Referring Phys: [place]  1. Left ventricular ejection fraction, by estimation, is 60 to 65%. The left ventricle has normal function. The left ventricle has no regional wall motion abnormalities. Indeterminate diastolic filling due to E-A fusion.  2. Right ventricular systolic function is normal. The right ventricular size is normal.  3. Large circumferential pericardial effusion with evidence of tamponade . Large pericardial effusion. The pericardial effusion is circumferential.  4. The mitral valve is normal in structure. No evidence of mitral valve regurgitation. No evidence of mitral stenosis.  5. The aortic valve is tricuspid. Aortic valve regurgitation is not visualized. No aortic stenosis is present.  6. The inferior vena cava is normal in size with greater than 50% respiratory variability, suggesting right atrial pressure of 3 mmHg. FINDINGS  Left Ventricle: Left ventricular ejection fraction, by estimation, is 60 to 65%. The left ventricle has normal function. The left ventricle has no regional wall motion abnormalities. The left ventricular internal cavity size was normal in size. There is  no left ventricular hypertrophy. Indeterminate diastolic filling due to E-A fusion. Right Ventricle: The right ventricular size is normal. No increase in right ventricular wall thickness. Right ventricular systolic function is normal. Left Atrium: Left atrial size was normal in size. Right Atrium: Right atrial size was normal in size. Pericardium: Large circumferential pericardial effusion with evidence of tamponade. A large pericardial effusion is present. The pericardial effusion is circumferential. There is excessive respiratory variation in the mitral valve spectral Doppler velocities, diastolic collapse of the right atrial wall and diastolic collapse of the right ventricular free wall. Mitral Valve: The mitral valve is normal in  structure. No evidence of mitral valve regurgitation. No evidence of mitral valve stenosis. Tricuspid Valve: The tricuspid valve is normal in structure. Tricuspid valve regurgitation is not demonstrated. No evidence of tricuspid stenosis. Aortic Valve: The aortic valve is tricuspid. Aortic valve regurgitation is not visualized. No aortic stenosis is present. Pulmonic Valve: The pulmonic valve was normal in structure. Pulmonic valve regurgitation is not visualized. No evidence of pulmonic stenosis. Aorta: The  aortic root is normal in size and structure. Venous: The inferior vena cava is normal in size with greater than 50% respiratory variability, suggesting right atrial pressure of 3 mmHg. IAS/Shunts: No atrial level shunt detected by color flow Doppler.  LEFT VENTRICLE PLAX 2D LVIDd:         4.40 cm LVIDs:         2.90 cm LV PW:         0.90 cm LV IVS:        0.80 cm  LEFT ATRIUM         Index LA diam:    2.80 cm 1.41 cm/m   AORTA Ao Asc diam: 2.60 cm Jenkins Rouge MD Electronically signed by Jenkins Rouge MD Signature Date/Time: 04/29/2021/7:48:41 AM    Final      Imaging independently reviewed in Epic.    [facility] [phone] pager 04/29/2021, 10:50 AM

## 2021-04-29 NOTE — Progress Notes (Signed)
[facility] PROGRESS NOTE   Scott Foley QBH:[id number] DOB: 1965/09/29 DOA: 04/13/2021  16 DOS: the patient was seen and examined on 04/29/2021  PCP: [name], MD  Brief History and Hospital Course:  56 y.o. male with medical history significant of hx of CAD, HTN, hx of PE on eliquis, HLD, hxo of CVA in 2014, IDA with history of recent hospitalizaion on 03/16/21 for recurrent pancreatitis, sepsis due to bacteremia from klebsiella pneumoniae, covid-19 and respiratory failure requiring home oxygen on discharge.  Presented with a 4-day history of diffuse worsening abdominal pain.  He has had abdominal issues for the past 2 months.  He had a cholecystectomy on 12/21 and underwent ERCP on 12/22 since the intraoperative cholangiogram showed a CBD stone.  After ERCP he developed pancreatitis.  He was managed conservatively and then discharged home on 12/28.  He was readmitted to [facility] on 1/4 for recurrent pancreatitis and COVID-19.  Noted to have sepsis respiratory failure.  Blood cultures were positive for Klebsiella pneumonia.  He was treated with Invanz for 10 days.  Discharged on 2 L of oxygen. He had a postop visit with surgeon on 04/12/21 when he complained of abdominal pain.  A CT scan was subsequently done which showed pancreatic necrosis with gas within the pancreatic fluid collections.  He was sent over to [facility] emergency department.  Interventional radiology was consulted for aspiration of the fluid collection which was performed on 2/6.   CT scan of the abdomen pelvis was repeated on 2/16.  Showed stable findings for the most part. Incidentally noted to have significant pericardial effusion with features concerning for tamponade.  Cardiology was consulted.  Urgent echocardiogram was performed   Consultants: Gastroenterology.  Interventional radiology.  General surgery.  Infectious disease  Procedures:  Drainage of fluid collection in the abdomen by  interventional radiology on 2/6  Transthoracic echocardiogram 2/17    Subjective: Patient feels well.  Denies any complaints this morning.  Specifically no chest pain shortness of breath nausea or vomiting.  Trying to eat better.     Assessment/Plan:   * Necrotizing pancreatitis- (present on admission) This is either a third episode of pancreatitis or sequelae of pancreatitis from his episode in January.  His lipase level was noted to be normal.  He does have significant leukocytosis.  CT scan was done at [facility].  Do not have access to that report but according to the H&P it showed progressive severe pancreatitis with extensive fluid collection surrounding the pancreas and extending into the right retroperitoneum.  Many of these fluid collections contained gas concerning for infection.  Compression of splenic vein and SMV was noted although they appear to be patent.  Patient was started on meropenem.   Gastroenterology consulted. General surgery (signed off) Interventional radiology consulted for percutaneous drainage which was done on 2/6  Diet was slowly advanced.  Calorie count is in progress.  Currently getting nocturnal tube feedings. CT scan of the abdomen pelvis was repeated yesterday.   Evolving changes of necrotizing pancreatitis noted.  Mostly stable findings.  Fluid collection which was drained was noted to be decreased in size.  Wide patency of the superior mesenteric, splenic and portal veins were noted. ID recommends continuing meropenem. Drain to remain in place for now.  Pericardial effusion CT scan incidentally showed clinically significant pericardial effusion.  There was some concern for tamponade although patient was noted to be hemodynamically stable.  Cardiology was consulted urgently overnight.  Patient underwent urgent  echocardiogram. Etiology is thought to be secondary to inflammation from the pancreas.  Cardiology feels that he will likely need  pericardiocentesis but not imminently necessary.  They recommend avoiding diuretics.  Plan is to do serial echocardiograms at this time.  Normocytic anemia- (present on admission) Patient did have a significant drop in hemoglobin most of which was thought to be secondary to dilution. Anemia panel reviewed.  Vitamin B12 noted to be low normal at 254.  Being supplemented.  Folate 8.2.  Ferritin 1033.  TIBC 137.  Iron 16. He has been transfused 1 unit of PRBC during this hospital stay so far.   Hemoglobin is low but stable.  Sepsis ([place])- (present on admission) On admission he met sepsis criteria with tachycardia, fever to 100.8, WBC to 18.2.  Recently noted to have Klebsiella bacteremia earlier in January when he was hospitalized in [place].  Blood cultures done here in the hospital have been negative so far. Sepsis physiology improved.  Patient remains afebrile.  WBC is elevated but stable.  Recheck labs tomorrow. Cultures from fluid sent after aspiration 2/6- kleb/citrobacter Infectious disease continues to follow.  Remains on meropenem.  Chronic respiratory failure with hypoxia ([place])- (present on admission) Recently recovered from COVID-19 infection.  Was discharged on 2 L of oxygen by nasal cannula.  Admission chest x-ray showed opacities which could be from his recent infection rather than any infection.   Respiratory status is stable.  Looks like he has been weaned off of oxygen.  Chronic obstructive pulmonary disease ([place])- (present on admission) No signs of exacerbation. Continue trelegy daily and albuterol prn.  History of pulmonary embolism- (present on admission) Eliquis is currently on hold.  He is on heparin infusion.   Initially plan was to resume his apixaban after the CT scan was performed.  However due to possible need for intervention on his pericardial effusion we will continue with IV heparin for now.  Essential hypertension- (present on admission) Sinus  tachycardia  ACE inhibitor on hold due to borderline low blood pressures. Sinus tachycardia likely secondary to severe illness. He was started back on beta-blocker due to concern for rebound tachycardia.  Heart rate is better. Blood pressure is reasonably well controlled  Hyperlipemia- (present on admission) Statin on hold currently.  Triglyceride was 121 in December.  Coronary artery disease with stable angina pectoris ([place])- (present on admission) Stable.  Plavix currently on hold.   Protein-calorie malnutrition, severe Started on tube feedings which he is tolerating well- transitioned to nocturnal tube feeds.  Continue to monitor.  Nutrition Status: Nutrition Problem: Severe Malnutrition Etiology: acute illness (pancreatitis) Signs/Symptoms: moderate muscle depletion, moderate fat depletion, energy intake < or equal to 50% for > or equal to 5 days, percent weight loss (11% weight loss within 3 months) Percent weight loss: 11 % Interventions: Tube feeding    Hypokalemia- (present on admission) Hypomagnesemia  Repleted.  Check every so often.  AKI (acute kidney injury) ([place])- (present on admission) Presented with creatinine of 2.49. This was likely prerenal in the setting of sepsis along with nausea and vomiting over the past few days.   Patient aggressively hydrated.  Renal function has improved and back to normal.  Hypoglycemia Resolved with initiation of fluids and nutrition.   Obesity Estimated body mass index is 35.57 kg/m as calculated from the following:   Height as of this encounter: 5' 4" (1.626 m).   Weight as of this encounter: 94 kg.    DVT Prophylaxis: On IV heparin infusion Code  Status: Full code Family Communication: Discussed with patient.  We will update his wife later today Disposition Plan: Hopefully return home when improved  Status is: Inpatient  Remains inpatient appropriate because: Acute pancreatitis, infected intra-abdominal fluid  collection       Medications: Scheduled:  feeding supplement  237 mL Oral BID BM   feeding supplement (PROSource TF)  45 mL Per Tube BID   feeding supplement (VITAL 1.5 CAL)  840 mL Per Tube Q24H   fluticasone furoate-vilanterol  1 puff Inhalation Daily   And   umeclidinium bromide  1 puff Inhalation Daily   free water  30 mL Per Tube Q4H   metoprolol tartrate  50 mg Oral BID   pantoprazole  40 mg Oral BID   sodium chloride flush  5 mL Intracatheter Q8H   vitamin B-12  1,000 mcg Per Tube Daily   Continuous:  heparin 2,550 Units/hr (04/29/21 0807)   meropenem (MERREM) IV 1 g (04/29/21 0810)   DJM:[id number], albuterol, morphine injection, ondansetron **OR** ondansetron (ZOFRAN) IV, oxyCODONE  Antibiotics: Anti-infectives (From admission, onward)    Start     Dose/Rate Route Frequency Ordered Stop   04/16/21 1600  meropenem (MERREM) 1 g in sodium chloride 0.9 % 100 mL IVPB        1 g 200 mL/hr over 30 Minutes Intravenous Every 8 hours 04/16/21 1234     04/13/21 2000  meropenem (MERREM) 1 g in sodium chloride 0.9 % 100 mL IVPB  Status:  Discontinued        1 g 200 mL/hr over 30 Minutes Intravenous Every 12 hours 04/13/21 1934 04/16/21 1234   04/13/21 1800  ceFEPIme (MAXIPIME) 2 g in sodium chloride 0.9 % 100 mL IVPB  Status:  Discontinued        2 g 200 mL/hr over 30 Minutes Intravenous Every 12 hours 04/13/21 1731 04/13/21 1852   04/13/21 1715  levofloxacin (LEVAQUIN) IVPB 750 mg  Status:  Discontinued        750 mg 100 mL/hr over 90 Minutes Intravenous  Once 04/13/21 1713 04/13/21 1728   04/13/21 1715  metroNIDAZOLE (FLAGYL) IVPB 500 mg  Status:  Discontinued        500 mg 100 mL/hr over 60 Minutes Intravenous  Once 04/13/21 1713 04/13/21 1852       Objective:  Vital Signs  Vitals:   04/29/21 0811 04/29/21 0815 04/29/21 0846 04/29/21 1046  BP:   123/89 109/82  Pulse:   100 93  Resp:   (!) 28 (!) 21  Temp: 98.3 F (36.8 C)     TempSrc: Oral     SpO2:  96%   95%  Weight:      Height:        Intake/Output Summary (Last 24 hours) at 04/29/2021 1122 Last data filed at 04/29/2021 0700 Gross per 24 hour  Intake 5 ml  Output 950 ml  Net -945 ml    Filed Weights   04/22/21 0454 04/24/21 0500 04/25/21 0500  Weight: 90.3 kg 94 kg 94 kg    General appearance: Awake alert.  In no distress Resp: Clear to auscultation bilaterally.  Normal effort Cardio: S1-S2 is normal regular.  No S3-S4.  No rubs murmurs or bruit GI: Abdomen is soft.  Mildly tender in the epigastric area.  Drain noted in the right abdomen. Extremities: No edema.  Neurologic: Alert and oriented x3.  No focal neurological deficits.       Lab Results:  Data Reviewed: I  have personally reviewed labs and imaging study reports  CBC: Recent Labs  Lab 04/23/21 0104 04/24/21 0320 04/26/21 0450 04/27/21 0412 04/29/21 0303  WBC 18.2* 16.5* 17.2* 16.5* 15.2*  HGB 7.5* 8.0* 8.4* 8.3* 8.0*  HCT 23.3* 25.4* 25.5* 24.9* 24.7*  MCV 87.3 87.0 85.3 85.6 86.4  PLT 391 380 390 406* 412*     Basic Metabolic Panel: Recent Labs  Lab 04/23/21 0104 04/24/21 0320 04/26/21 0450 04/27/21 0412 04/29/21 0303  NA 135 135 133* 132* 132*  K 4.3 4.1 4.1 4.0 4.2  CL 101 101 100 100 100  CO2 25 24 21* 23 22  GLUCOSE 110* 103* 104* 120* 115*  BUN 18 20 24* 27* 28*  CREATININE 0.79 0.97 1.02 1.11 1.03  CALCIUM 8.2* 8.4* 8.9 8.9 9.0     GFR: Estimated Creatinine Clearance: 82.8 mL/min (by C-G formula based on SCr of 1.03 mg/dL).   CBG: Recent Labs  Lab 04/28/21 1739 04/28/21 1945 04/29/21 0012 04/29/21 0434 04/29/21 0807  GLUCAP 97 98 104* 123* 104*      No results found for this or any previous visit (from the past 240 hour(s)).     Radiology Studies: CT ABDOMEN PELVIS W CONTRAST  Addendum Date: 04/28/2021   ADDENDUM REPORT: 04/28/2021 19:43 ADDENDUM: These results were called by telephone at the time of interpretation on 04/28/2021 at 7:39 pm to provider Dr.  Olena Heckle, Who verbally acknowledged these results. Electronically Signed   By: Fidela Salisbury M.D.   On: 04/28/2021 19:43   Result Date: 04/28/2021 CLINICAL DATA:  Necrotizing pancreatitis, follow-up examination EXAM: CT ABDOMEN AND PELVIS WITH CONTRAST TECHNIQUE: Multidetector CT imaging of the abdomen and pelvis was performed using the standard protocol following bolus administration of intravenous contrast. RADIATION DOSE REDUCTION: This exam was performed according to the departmental dose-optimization program which includes automated exposure control, adjustment of the mA and/or kV according to patient size and/or use of iterative reconstruction technique. CONTRAST:  155m OMNIPAQUE IOHEXOL 300 MG/ML  SOLN COMPARISON:  04/18/2021 FINDINGS: Lower chest: Moderate, complex appearing pericardial effusion demonstrating subtle pericardial enhancement has developed with evidence of cardiac tamponade and relative leftward shift of the interventricular septum when compared to prior examination. Small bilateral pleural effusions are again identified with associated bibasilar compressive atelectasis. Nodular densities within the subpleural right middle lobe are unchanged. Hepatobiliary: No focal liver abnormality is seen. Status post cholecystectomy. No biliary dilatation. Mild pneumobilia within the nondependent left hepatic lobe again noted. Pancreas: Findings in keeping with necrotizing pancreatitis are again identified with heterogeneous enhancement the pancreatic parenchyma best appreciated within the head and body of the pancreas, stable since prior examination. Acute peripancreatic necrotic collections adjacent to the mid body of the pancreas at axial image # 36/3 measuring 3.9 x 2.9 cm appear better defined though a discrete capsule is not clearly identified to suggest walled off necrosis at this time. However, more loculated gas and complex fluid containing collections adjacent to the head of the pancreas  measuring 3.0 x 3.9 cm at axial image # 38/3 and 2.2 x 3.8 cm at axial image # 47/3 now demonstrate more well-defined enhancing margins and are compatible with areas of walled-off necrosis. As noted previously, super infection is not excluded. Smaller peripancreatic acute necrotic collections adjacent to the tail of the pancreas appear relatively stable since prior examination. Inflammatory changes are again identified tracking into the pericolic gutters bilaterally. Spleen: Unremarkable.  The splenic vein is patent. Adrenals/Urinary Tract: Adrenal glands are unremarkable. Kidneys are normal, without  renal calculi, focal lesion, or hydronephrosis. Bladder is unremarkable. Stomach/Bowel: Nasoenteric feeding tube tip seen within the proximal jejunum beyond the mid lumen of [place]. Mild ascending colonic diverticulosis. The stomach, small bowel, and large bowel are otherwise unremarkable there is no evidence of obstruction or focal inflammation. Appendix normal. No free intraperitoneal gas or fluid. Vascular/Lymphatic: Previously noted narrowing of the portosplenic confluence, best appreciated on axial image # 36/3, appears improved since prior examination. The splenic vein, superior mesenteric vein, and portal vein appear patent. Extensive aortoiliac atherosclerotic calcification. Focal excrescence of the infrarenal abdominal aorta measuring 3.4 cm in transaxial dimension at axial image # 41/3, is stable possibly representing a a thrombosed penetrating atherosclerotic ulcer or focal dissection. This is unchanged since remote prior examination of 03/03/2019. No pathologic adenopathy within the abdomen and pelvis. Reproductive: Prostate is unremarkable. Other: The previously noted area of walled-off necrosis within the right retroperitoneum within the iliac fossa has undergone percutaneous drainage with a cope loop catheter well position within the residual collection. The collection has decreased in size, now measuring  2.9 x 3.6 cm at axial image # 61/3. Tiny right fat containing inguinal hernia. Mild subcutaneous edema noted within the flanks bilaterally. Musculoskeletal: No acute bone abnormality. No lytic or blastic bone lesions are identified. Stable remote L1 compression fracture IMPRESSION: Interval development of a moderate pericardial effusion demonstrating complex features with pericardial enhancement and evidence of cardiac tamponade with mild relative leftward shift of the intraventricular septum when compared to prior examination of 04/18/2021. Correlation with echocardiography is recommended. Stable changes of anasarca with small bilateral pleural effusions and mild subcutaneous edema within the flanks bilaterally. Evolving changes of necrotizing pancreatitis. Stable enhancement of the residual pancreatic parenchyma. Increasing organization peripancreatic necrosis adjacent to the head and body of the pancreas with areas of more formal walled-off necrosis adjacent to the pancreatic head demonstrating slight interval decrease in size since prior examination. Intraluminal gas within areas of walled-off necrosis are again identified and superinfection is not excluded. Interval percutaneous drainage of area retroperitoneal walled-off necrosis within the right iliac fossa with interval decrease in size of loculated collection. Improved mass effect upon the portosplenic confluence with wide patency of the superior mesenteric vein, splenic vein, and portal vein. Attempts are being made at this time to contact the patient is managing clinician for direct verbal communication of these findings. Electronically Signed: By: Fidela Salisbury M.D. On: 04/28/2021 19:25   ECHOCARDIOGRAM LIMITED  Result Date: 04/29/2021    ECHOCARDIOGRAM REPORT   Patient Name:   Scott Foley Date of Exam: 04/28/2021 Medical Rec #:  [id number]             Height:       64.0 in Accession #:    [id number]            Weight:       207.2 lb Date of  Birth:  12/10/1965             BSA:          1.986 m Patient Age:    22 years              BP:           122/89 mmHg Patient Gender: M                     HR:           109 bpm. Exam Location:  Inpatient Procedure: Limited Echo, Cardiac Doppler  and Limited Color Doppler                     STAT ECHO Reported to: Dr. Hassell Done on 04/28/2021 10:20:00 PM. Indications:    cardiac tamponade  History:        Patient has prior history of Echocardiogram examinations, most                 recent 02/09/2021. CAD, COPD; Risk Factors:Dyslipidemia.  Sonographer:    Johny Chess RDCS Referring Phys: [place]  1. Left ventricular ejection fraction, by estimation, is 60 to 65%. The left ventricle has normal function. The left ventricle has no regional wall motion abnormalities. Indeterminate diastolic filling due to E-A fusion.  2. Right ventricular systolic function is normal. The right ventricular size is normal.  3. Large circumferential pericardial effusion with evidence of tamponade . Large pericardial effusion. The pericardial effusion is circumferential.  4. The mitral valve is normal in structure. No evidence of mitral valve regurgitation. No evidence of mitral stenosis.  5. The aortic valve is tricuspid. Aortic valve regurgitation is not visualized. No aortic stenosis is present.  6. The inferior vena cava is normal in size with greater than 50% respiratory variability, suggesting right atrial pressure of 3 mmHg. FINDINGS  Left Ventricle: Left ventricular ejection fraction, by estimation, is 60 to 65%. The left ventricle has normal function. The left ventricle has no regional wall motion abnormalities. The left ventricular internal cavity size was normal in size. There is  no left ventricular hypertrophy. Indeterminate diastolic filling due to E-A fusion. Right Ventricle: The right ventricular size is normal. No increase in right ventricular wall thickness. Right ventricular systolic function is  normal. Left Atrium: Left atrial size was normal in size. Right Atrium: Right atrial size was normal in size. Pericardium: Large circumferential pericardial effusion with evidence of tamponade. A large pericardial effusion is present. The pericardial effusion is circumferential. There is excessive respiratory variation in the mitral valve spectral Doppler velocities, diastolic collapse of the right atrial wall and diastolic collapse of the right ventricular free wall. Mitral Valve: The mitral valve is normal in structure. No evidence of mitral valve regurgitation. No evidence of mitral valve stenosis. Tricuspid Valve: The tricuspid valve is normal in structure. Tricuspid valve regurgitation is not demonstrated. No evidence of tricuspid stenosis. Aortic Valve: The aortic valve is tricuspid. Aortic valve regurgitation is not visualized. No aortic stenosis is present. Pulmonic Valve: The pulmonic valve was normal in structure. Pulmonic valve regurgitation is not visualized. No evidence of pulmonic stenosis. Aorta: The aortic root is normal in size and structure. Venous: The inferior vena cava is normal in size with greater than 50% respiratory variability, suggesting right atrial pressure of 3 mmHg. IAS/Shunts: No atrial level shunt detected by color flow Doppler.  LEFT VENTRICLE PLAX 2D LVIDd:         4.40 cm LVIDs:         2.90 cm LV PW:         0.90 cm LV IVS:        0.80 cm  LEFT ATRIUM         Index LA diam:    2.80 cm 1.41 cm/m   AORTA Ao Asc diam: 2.60 cm Jenkins Rouge MD Electronically signed by Jenkins Rouge MD Signature Date/Time: 04/29/2021/7:48:41 AM    Final        LOS: 16 days   [place] [facility] Pager on [email or web address]  04/29/2021, 11:22 AM

## 2021-04-29 NOTE — Progress Notes (Signed)
Referring Physician(s): Dr. Maryland Pink  Supervising Physician: Arne Cleveland  Patient Status:  [facility] - In-pt  Chief Complaint:  Pancreatic pseudocyst s/p RLQ drain placement in IR 04/18/21  Subjective:  Pt sitting upright in recliner watching TV. He states he is feeling better. He endorses tenderness with palpation and movement to insertion site.   Allergies: Penicillin g  Medications: Prior to Admission medications   Medication Sig Start Date End Date Taking? Authorizing Provider  albuterol (VENTOLIN HFA) 108 (90 Base) MCG/ACT inhaler Inhale 2 puffs into the lungs every 6 (six) hours as needed for wheezing or shortness of breath. 04/04/21  Yes Rip Harbour, NP  apixaban (ELIQUIS) 5 MG TABS tablet Take 1 tablet (5 mg total) by mouth 2 (two) times daily. 04/01/21  Yes Rip Harbour, NP  clopidogrel (PLAVIX) 75 MG tablet Take 1 tablet (75 mg total) by mouth daily. 04/01/21  Yes Rip Harbour, NP  Evolocumab (REPATHA) 140 MG/ML SOSY Inject 140 mg into the skin every 14 (fourteen) days. 12/30/20  Yes [name], [name], MD  Fluticasone-Umeclidin-Vilant (TRELEGY ELLIPTA) 100-62.5-25 MCG/ACT AEPB Inhale 1 puff into the lungs daily. 04/04/21  Yes Rip Harbour, NP  Ibuprofen-diphenhydrAMINE Cit (ADVIL PM PO) Take 1 tablet by mouth at bedtime.   Yes [provider]  isosorbide mononitrate (IMDUR) 60 MG 24 hr tablet Take 1 tablet (60 mg total) by mouth daily. 04/01/21  Yes Rip Harbour, NP  lisinopril (ZESTRIL) 40 MG tablet Take 1 tablet (40 mg total) by mouth daily. 04/01/21  Yes Rip Harbour, NP  metoprolol tartrate (LOPRESSOR) 50 MG tablet Take 1 tablet (50 mg total) by mouth 2 (two) times daily. 04/01/21  Yes Rip Harbour, NP  Omega-3 Fatty Acids (FISH OIL) 1000 MG CAPS Take 1 capsule (1,000 mg total) by mouth 2 (two) times daily. 11/30/20  Yes [name], [name], MD  pantoprazole (PROTONIX) 40 MG tablet Take 1 tablet (40 mg total) by mouth daily. Patient taking  differently: Take 40 mg by mouth 2 (two) times daily. 02/15/21  Yes Rip Harbour, NP  rosuvastatin (CRESTOR) 40 MG tablet Take 1 tablet (40 mg total) by mouth daily. Patient taking differently: Take 40 mg by mouth at bedtime. 04/01/21  Yes Rip Harbour, NP     Vital Signs: BP 109/82    Pulse 93    Temp 98.3 F (36.8 C) (Oral)    Resp (!) 21    Ht 5\' 4"  (1.626 m)    Wt 207 lb 3.7 oz (94 kg)    SpO2 95%    BMI 35.57 kg/m   Physical Exam Constitutional:      Appearance: He is well-developed. He is not ill-appearing.  HENT:     Head: Normocephalic and atraumatic.  Eyes:     Extraocular Movements: Extraocular movements intact.     Pupils: Pupils are equal, round, and reactive to light.  Pulmonary:     Effort: Pulmonary effort is normal. No respiratory distress.  Abdominal:     Comments: RLQ drain intact with sutures and statlock in place. Mild redness and dried blood noted to insertion site. No swelling, active bleeding or oozing noted. Dressing C/D/I. Scant amt tan, feculent OP in gravity bag with 50 cc documented in Epic over past 24 hours. Drain flushes, aspirates easily.  Skin:    General: Skin is warm and dry.  Neurological:     Mental Status: He is alert and oriented to person, place, and time.  Psychiatric:  Mood and Affect: Mood normal.        Behavior: Behavior normal.        Thought Content: Thought content normal.        Judgment: Judgment normal.    Imaging: CT ABDOMEN PELVIS W CONTRAST  Addendum Date: 04/28/2021   ADDENDUM REPORT: 04/28/2021 19:43 ADDENDUM: These results were called by telephone at the time of interpretation on 04/28/2021 at 7:39 pm to provider Dr. Olena Heckle, Who verbally acknowledged these results. Electronically Signed   By: Fidela Salisbury M.D.   On: 04/28/2021 19:43   Result Date: 04/28/2021 CLINICAL DATA:  Necrotizing pancreatitis, follow-up examination EXAM: CT ABDOMEN AND PELVIS WITH CONTRAST TECHNIQUE: Multidetector CT imaging of the  abdomen and pelvis was performed using the standard protocol following bolus administration of intravenous contrast. RADIATION DOSE REDUCTION: This exam was performed according to the departmental dose-optimization program which includes automated exposure control, adjustment of the mA and/or kV according to patient size and/or use of iterative reconstruction technique. CONTRAST:  151mL OMNIPAQUE IOHEXOL 300 MG/ML  SOLN COMPARISON:  04/18/2021 FINDINGS: Lower chest: Moderate, complex appearing pericardial effusion demonstrating subtle pericardial enhancement has developed with evidence of cardiac tamponade and relative leftward shift of the interventricular septum when compared to prior examination. Small bilateral pleural effusions are again identified with associated bibasilar compressive atelectasis. Nodular densities within the subpleural right middle lobe are unchanged. Hepatobiliary: No focal liver abnormality is seen. Status post cholecystectomy. No biliary dilatation. Mild pneumobilia within the nondependent left hepatic lobe again noted. Pancreas: Findings in keeping with necrotizing pancreatitis are again identified with heterogeneous enhancement the pancreatic parenchyma best appreciated within the head and body of the pancreas, stable since prior examination. Acute peripancreatic necrotic collections adjacent to the mid body of the pancreas at axial image # 36/3 measuring 3.9 x 2.9 cm appear better defined though a discrete capsule is not clearly identified to suggest walled off necrosis at this time. However, more loculated gas and complex fluid containing collections adjacent to the head of the pancreas measuring 3.0 x 3.9 cm at axial image # 38/3 and 2.2 x 3.8 cm at axial image # 47/3 now demonstrate more well-defined enhancing margins and are compatible with areas of walled-off necrosis. As noted previously, super infection is not excluded. Smaller peripancreatic acute necrotic collections adjacent  to the tail of the pancreas appear relatively stable since prior examination. Inflammatory changes are again identified tracking into the pericolic gutters bilaterally. Spleen: Unremarkable.  The splenic vein is patent. Adrenals/Urinary Tract: Adrenal glands are unremarkable. Kidneys are normal, without renal calculi, focal lesion, or hydronephrosis. Bladder is unremarkable. Stomach/Bowel: Nasoenteric feeding tube tip seen within the proximal jejunum beyond the mid lumen of Treitz. Mild ascending colonic diverticulosis. The stomach, small bowel, and large bowel are otherwise unremarkable there is no evidence of obstruction or focal inflammation. Appendix normal. No free intraperitoneal gas or fluid. Vascular/Lymphatic: Previously noted narrowing of the portosplenic confluence, best appreciated on axial image # 36/3, appears improved since prior examination. The splenic vein, superior mesenteric vein, and portal vein appear patent. Extensive aortoiliac atherosclerotic calcification. Focal excrescence of the infrarenal abdominal aorta measuring 3.4 cm in transaxial dimension at axial image # 41/3, is stable possibly representing a a thrombosed penetrating atherosclerotic ulcer or focal dissection. This is unchanged since remote prior examination of 03/03/2019. No pathologic adenopathy within the abdomen and pelvis. Reproductive: Prostate is unremarkable. Other: The previously noted area of walled-off necrosis within the right retroperitoneum within the iliac fossa has undergone percutaneous drainage  with a cope loop catheter well position within the residual collection. The collection has decreased in size, now measuring 2.9 x 3.6 cm at axial image # 61/3. Tiny right fat containing inguinal hernia. Mild subcutaneous edema noted within the flanks bilaterally. Musculoskeletal: No acute bone abnormality. No lytic or blastic bone lesions are identified. Stable remote L1 compression fracture IMPRESSION: Interval  development of a moderate pericardial effusion demonstrating complex features with pericardial enhancement and evidence of cardiac tamponade with mild relative leftward shift of the intraventricular septum when compared to prior examination of 04/18/2021. Correlation with echocardiography is recommended. Stable changes of anasarca with small bilateral pleural effusions and mild subcutaneous edema within the flanks bilaterally. Evolving changes of necrotizing pancreatitis. Stable enhancement of the residual pancreatic parenchyma. Increasing organization peripancreatic necrosis adjacent to the head and body of the pancreas with areas of more formal walled-off necrosis adjacent to the pancreatic head demonstrating slight interval decrease in size since prior examination. Intraluminal gas within areas of walled-off necrosis are again identified and superinfection is not excluded. Interval percutaneous drainage of area retroperitoneal walled-off necrosis within the right iliac fossa with interval decrease in size of loculated collection. Improved mass effect upon the portosplenic confluence with wide patency of the superior mesenteric vein, splenic vein, and portal vein. Attempts are being made at this time to contact the patient is managing clinician for direct verbal communication of these findings. Electronically Signed: By: Fidela Salisbury M.D. On: 04/28/2021 19:25   ECHOCARDIOGRAM LIMITED  Result Date: 04/29/2021    ECHOCARDIOGRAM REPORT   Patient Name:   Scott Foley Date of Exam: 04/28/2021 Medical Rec #:  [id number]             Height:       64.0 in Accession #:    [id number]            Weight:       207.2 lb Date of Birth:  06-18-1965             BSA:          1.986 m Patient Age:    56 years              BP:           122/89 mmHg Patient Gender: M                     HR:           109 bpm. Exam Location:  Inpatient Procedure: Limited Echo, Cardiac Doppler and Limited Color Doppler                      STAT ECHO Reported to: Dr. Hassell Done on 04/28/2021 10:20:00 PM. Indications:    cardiac tamponade  History:        Patient has prior history of Echocardiogram examinations, most                 recent 02/09/2021. CAD, COPD; Risk Factors:Dyslipidemia.  Sonographer:    Johny Chess RDCS Referring Phys: [place]  1. Left ventricular ejection fraction, by estimation, is 60 to 65%. The left ventricle has normal function. The left ventricle has no regional wall motion abnormalities. Indeterminate diastolic filling due to E-A fusion.  2. Right ventricular systolic function is normal. The right ventricular size is normal.  3. Large circumferential pericardial effusion with evidence of tamponade . Large pericardial effusion. The pericardial effusion is circumferential.  4.  The mitral valve is normal in structure. No evidence of mitral valve regurgitation. No evidence of mitral stenosis.  5. The aortic valve is tricuspid. Aortic valve regurgitation is not visualized. No aortic stenosis is present.  6. The inferior vena cava is normal in size with greater than 50% respiratory variability, suggesting right atrial pressure of 3 mmHg. FINDINGS  Left Ventricle: Left ventricular ejection fraction, by estimation, is 60 to 65%. The left ventricle has normal function. The left ventricle has no regional wall motion abnormalities. The left ventricular internal cavity size was normal in size. There is  no left ventricular hypertrophy. Indeterminate diastolic filling due to E-A fusion. Right Ventricle: The right ventricular size is normal. No increase in right ventricular wall thickness. Right ventricular systolic function is normal. Left Atrium: Left atrial size was normal in size. Right Atrium: Right atrial size was normal in size. Pericardium: Large circumferential pericardial effusion with evidence of tamponade. A large pericardial effusion is present. The pericardial effusion is circumferential. There is  excessive respiratory variation in the mitral valve spectral Doppler velocities, diastolic collapse of the right atrial wall and diastolic collapse of the right ventricular free wall. Mitral Valve: The mitral valve is normal in structure. No evidence of mitral valve regurgitation. No evidence of mitral valve stenosis. Tricuspid Valve: The tricuspid valve is normal in structure. Tricuspid valve regurgitation is not demonstrated. No evidence of tricuspid stenosis. Aortic Valve: The aortic valve is tricuspid. Aortic valve regurgitation is not visualized. No aortic stenosis is present. Pulmonic Valve: The pulmonic valve was normal in structure. Pulmonic valve regurgitation is not visualized. No evidence of pulmonic stenosis. Aorta: The aortic root is normal in size and structure. Venous: The inferior vena cava is normal in size with greater than 50% respiratory variability, suggesting right atrial pressure of 3 mmHg. IAS/Shunts: No atrial level shunt detected by color flow Doppler.  LEFT VENTRICLE PLAX 2D LVIDd:         4.40 cm LVIDs:         2.90 cm LV PW:         0.90 cm LV IVS:        0.80 cm  LEFT ATRIUM         Index LA diam:    2.80 cm 1.41 cm/m   AORTA Ao Asc diam: 2.60 cm Jenkins Rouge MD Electronically signed by Jenkins Rouge MD Signature Date/Time: 04/29/2021/7:48:41 AM    Final     Labs:  CBC: Recent Labs    04/24/21 0320 04/26/21 0450 04/27/21 0412 04/29/21 0303  WBC 16.5* 17.2* 16.5* 15.2*  HGB 8.0* 8.4* 8.3* 8.0*  HCT 25.4* 25.5* 24.9* 24.7*  PLT 380 390 406* 412*    COAGS: Recent Labs    04/13/21 1523 04/14/21 0230 04/15/21 0153 04/15/21 0909 04/15/21 2008 04/16/21 0649 04/17/21 0341  INR 2.4*  --  1.6*  --   --  1.4* 1.5*  APTT 52*   < >  --  62* 69* 74* 65*   < > = values in this interval not displayed.    BMP: Recent Labs    04/24/21 0320 04/26/21 0450 04/27/21 0412 04/29/21 0303  NA 135 133* 132* 132*  K 4.1 4.1 4.0 4.2  CL 101 100 100 100  CO2 24 21* 23 22   GLUCOSE 103* 104* 120* 115*  BUN 20 24* 27* 28*  CALCIUM 8.4* 8.9 8.9 9.0  CREATININE 0.97 1.02 1.11 1.03  GFRNONAA >60 >60 >60 >60    LIVER  FUNCTION TESTS: Recent Labs    04/15/21 0153 04/16/21 0649 04/17/21 0637 04/18/21 0251  BILITOT 0.6 0.3 0.2* 0.4  AST 17 18 16 24   ALT 18 17 15 17   ALKPHOS 98 92 67 77  PROT 5.6* 5.8* 5.3* 4.8*  ALBUMIN 1.7* 1.7* 1.5* <1.5*    Assessment and Plan:  Pancreatic pseudocyst s/p RLQ drain placement in IR 04/18/21  Pt sitting upright in recliner watching TV. He states he is feeling better. He endorses tenderness with palpation and movement to insertion site.   RLQ drain intact with sutures and statlock in place. Mild redness and dried blood noted to insertion site. No swelling, active bleeding or oozing noted. Dressing C/D/I. Scant amt tan, feculent OP in gravity bag with 50 cc documented in Epic over past 24 hours. Drain flushes, aspirates easily.  Continue documenting OP in Epic q shift.  Continue flushing TID.  Change dressing q shift or as needed.  Please call IR with questions or concerns.    Electronically Signed: Tyson Alias, NP 04/29/2021, 3:30 PM   I spent a total of 15 Minutes at the the patient's bedside AND on the patient's hospital floor or unit, greater than 50% of which was counseling/coordinating care for pancreatic pseudocyst s/p RLQ drain placement in IR 04/18/21

## 2021-04-29 NOTE — Progress Notes (Signed)
Progress Note  Patient Name: Sidi Dzikowski Date of Encounter: 04/29/2021  [facility] Cardiologist: None   Subjective   Denies difficulty breathing but appears mildly tachypneic. Blood pressure is not low but pulse pressure is narrow (117/92).  Mildly tachycardic around 105 bpm. Abdominal pain control is fair at this time. Renal function is normal and urine output is maintained. Stable moderate anemia, hemoglobin around 8. 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, no recurrence on Eliquis prophylaxis)  Inpatient Medications    Scheduled Meds:  feeding supplement  237 mL Oral BID BM   feeding supplement (PROSource TF)  45 mL Per Tube BID   feeding supplement (VITAL 1.5 CAL)  840 mL Per Tube Q24H   fluticasone furoate-vilanterol  1 puff Inhalation Daily   And   umeclidinium bromide  1 puff Inhalation Daily   free water  30 mL Per Tube Q4H   metoprolol tartrate  50 mg Oral BID   pantoprazole  40 mg Oral BID   sodium chloride flush  5 mL Intracatheter Q8H   vitamin B-12  1,000 mcg Per Tube Daily   Continuous Infusions:  heparin 2,550 Units/hr (04/29/21 0807)   meropenem (MERREM) IV 1 g (04/29/21 0810)   PRN Meds: acetaminophen, albuterol, morphine injection, ondansetron **OR** ondansetron (ZOFRAN) IV, oxyCODONE   Vital Signs    Vitals:   04/29/21 0255 04/29/21 0445 04/29/21 0811 04/29/21 0815  BP: 124/86 126/88    Pulse:      Resp:  20    Temp:   98.3 F (36.8 C)   TempSrc:   Oral   SpO2:  94%  96%  Weight:      Height:        Intake/Output Summary (Last 24 hours) at 04/29/2021 0829 Last data filed at 04/29/2021 0700 Gross per 24 hour  Intake 5 ml  Output 950 ml  Net -945 ml   Last 3 Weights 04/25/2021 04/24/2021 04/22/2021  Weight (lbs) 207 lb 3.7 oz 207 lb 3.7 oz 199 lb 1.2 oz  Weight (kg) 94 kg 94 kg 90.3 kg      Telemetry    Mild sinus tachycardia- Personally Reviewed  ECG    Sinus tachycardia, low voltage and  poor R wave progression in precordial leads, no ischemic changes - Personally Reviewed  Physical Exam  Appears chronically ill, but not in acute distress NG tube in place Neck: 10 cm JVD Cardiac: RRR, diminished intensity of the heart sounds, no murmurs, rubs, or gallops.  Respiratory: Clear to auscultation bilaterally. GI: Soft, nontender, non-distended  MS: No edema; No deformity. Neuro:  Nonfocal  Psych: Normal affect   Labs    High Sensitivity Troponin:  No results for input(s): TROPONINIHS in the last 720 hours.   Chemistry Recent Labs  Lab 04/26/21 0450 04/27/21 0412 04/29/21 0303  NA 133* 132* 132*  K 4.1 4.0 4.2  CL 100 100 100  CO2 21* 23 22  GLUCOSE 104* 120* 115*  BUN 24* 27* 28*  CREATININE 1.02 1.11 1.03  CALCIUM 8.9 8.9 9.0  GFRNONAA >60 >60 >60  ANIONGAP 12 9 10     Lipids No results for input(s): CHOL, TRIG, HDL, LABVLDL, LDLCALC, CHOLHDL in the last 168 hours.  Hematology Recent Labs  Lab 04/26/21 0450 04/27/21 0412 04/29/21 0303  WBC 17.2* 16.5* 15.2*  RBC 2.99* 2.91* 2.86*  HGB 8.4* 8.3* 8.0*  HCT 25.5* 24.9* 24.7*  MCV 85.3 85.6 86.4  MCH 28.1  28.5 28.0  MCHC 32.9 33.3 32.4  RDW 17.7* 17.5* 17.5*  PLT 390 406* 412*   Thyroid No results for input(s): TSH, FREET4 in the last 168 hours.  BNPNo results for input(s): BNP, PROBNP in the last 168 hours.  DDimer No results for input(s): DDIMER in the last 168 hours.   Radiology    CT ABDOMEN PELVIS W CONTRAST  Addendum Date: 04/28/2021   ADDENDUM REPORT: 04/28/2021 19:43 ADDENDUM: These results were called by telephone at the time of interpretation on 04/28/2021 at 7:39 pm to provider Dr. Olena Heckle, Who verbally acknowledged these results. Electronically Signed   By: Fidela Salisbury M.D.   On: 04/28/2021 19:43   Result Date: 04/28/2021 CLINICAL DATA:  Necrotizing pancreatitis, follow-up examination EXAM: CT ABDOMEN AND PELVIS WITH CONTRAST TECHNIQUE: Multidetector CT imaging of the abdomen and pelvis  was performed using the standard protocol following bolus administration of intravenous contrast. RADIATION DOSE REDUCTION: This exam was performed according to the departmental dose-optimization program which includes automated exposure control, adjustment of the mA and/or kV according to patient size and/or use of iterative reconstruction technique. CONTRAST:  121mL OMNIPAQUE IOHEXOL 300 MG/ML  SOLN COMPARISON:  04/18/2021 FINDINGS: Lower chest: Moderate, complex appearing pericardial effusion demonstrating subtle pericardial enhancement has developed with evidence of cardiac tamponade and relative leftward shift of the interventricular septum when compared to prior examination. Small bilateral pleural effusions are again identified with associated bibasilar compressive atelectasis. Nodular densities within the subpleural right middle lobe are unchanged. Hepatobiliary: No focal liver abnormality is seen. Status post cholecystectomy. No biliary dilatation. Mild pneumobilia within the nondependent left hepatic lobe again noted. Pancreas: Findings in keeping with necrotizing pancreatitis are again identified with heterogeneous enhancement the pancreatic parenchyma best appreciated within the head and body of the pancreas, stable since prior examination. Acute peripancreatic necrotic collections adjacent to the mid body of the pancreas at axial image # 36/3 measuring 3.9 x 2.9 cm appear better defined though a discrete capsule is not clearly identified to suggest walled off necrosis at this time. However, more loculated gas and complex fluid containing collections adjacent to the head of the pancreas measuring 3.0 x 3.9 cm at axial image # 38/3 and 2.2 x 3.8 cm at axial image # 47/3 now demonstrate more well-defined enhancing margins and are compatible with areas of walled-off necrosis. As noted previously, super infection is not excluded. Smaller peripancreatic acute necrotic collections adjacent to the tail of the  pancreas appear relatively stable since prior examination. Inflammatory changes are again identified tracking into the pericolic gutters bilaterally. Spleen: Unremarkable.  The splenic vein is patent. Adrenals/Urinary Tract: Adrenal glands are unremarkable. Kidneys are normal, without renal calculi, focal lesion, or hydronephrosis. Bladder is unremarkable. Stomach/Bowel: Nasoenteric feeding tube tip seen within the proximal jejunum beyond the mid lumen of [place]. Mild ascending colonic diverticulosis. The stomach, small bowel, and large bowel are otherwise unremarkable there is no evidence of obstruction or focal inflammation. Appendix normal. No free intraperitoneal gas or fluid. Vascular/Lymphatic: Previously noted narrowing of the portosplenic confluence, best appreciated on axial image # 36/3, appears improved since prior examination. The splenic vein, superior mesenteric vein, and portal vein appear patent. Extensive aortoiliac atherosclerotic calcification. Focal excrescence of the infrarenal abdominal aorta measuring 3.4 cm in transaxial dimension at axial image # 41/3, is stable possibly representing a a thrombosed penetrating atherosclerotic ulcer or focal dissection. This is unchanged since remote prior examination of 03/03/2019. No pathologic adenopathy within the abdomen and pelvis. Reproductive: Prostate is unremarkable.  Other: The previously noted area of walled-off necrosis within the right retroperitoneum within the iliac fossa has undergone percutaneous drainage with a cope loop catheter well position within the residual collection. The collection has decreased in size, now measuring 2.9 x 3.6 cm at axial image # 61/3. Tiny right fat containing inguinal hernia. Mild subcutaneous edema noted within the flanks bilaterally. Musculoskeletal: No acute bone abnormality. No lytic or blastic bone lesions are identified. Stable remote L1 compression fracture IMPRESSION: Interval development of a moderate  pericardial effusion demonstrating complex features with pericardial enhancement and evidence of cardiac tamponade with mild relative leftward shift of the intraventricular septum when compared to prior examination of 04/18/2021. Correlation with echocardiography is recommended. Stable changes of anasarca with small bilateral pleural effusions and mild subcutaneous edema within the flanks bilaterally. Evolving changes of necrotizing pancreatitis. Stable enhancement of the residual pancreatic parenchyma. Increasing organization peripancreatic necrosis adjacent to the head and body of the pancreas with areas of more formal walled-off necrosis adjacent to the pancreatic head demonstrating slight interval decrease in size since prior examination. Intraluminal gas within areas of walled-off necrosis are again identified and superinfection is not excluded. Interval percutaneous drainage of area retroperitoneal walled-off necrosis within the right iliac fossa with interval decrease in size of loculated collection. Improved mass effect upon the portosplenic confluence with wide patency of the superior mesenteric vein, splenic vein, and portal vein. Attempts are being made at this time to contact the patient is managing clinician for direct verbal communication of these findings. Electronically Signed: By: Fidela Salisbury M.D. On: 04/28/2021 19:25   ECHOCARDIOGRAM LIMITED  Result Date: 04/29/2021    ECHOCARDIOGRAM REPORT   Patient Name:   TAVEON ENYEART Date of Exam: 04/28/2021 Medical Rec #:  [id number]             Height:       64.0 in Accession #:    [id number]            Weight:       207.2 lb Date of Birth:  Jan 21, 1966             BSA:          1.986 m Patient Age:    56 years              BP:           122/89 mmHg Patient Gender: M                     HR:           109 bpm. Exam Location:  Inpatient Procedure: Limited Echo, Cardiac Doppler and Limited Color Doppler                     STAT ECHO Reported to: Dr.  Hassell Done on 04/28/2021 10:20:00 PM. Indications:    cardiac tamponade  History:        Patient has prior history of Echocardiogram examinations, most                 recent 02/09/2021. CAD, COPD; Risk Factors:Dyslipidemia.  Sonographer:    Johny Chess RDCS Referring Phys: [place]  1. Left ventricular ejection fraction, by estimation, is 60 to 65%. The left ventricle has normal function. The left ventricle has no regional wall motion abnormalities. Indeterminate diastolic filling due to E-A fusion.  2. Right ventricular systolic function is normal. The right ventricular size is normal.  3. Large circumferential pericardial effusion with evidence of tamponade . Large pericardial effusion. The pericardial effusion is circumferential.  4. The mitral valve is normal in structure. No evidence of mitral valve regurgitation. No evidence of mitral stenosis.  5. The aortic valve is tricuspid. Aortic valve regurgitation is not visualized. No aortic stenosis is present.  6. The inferior vena cava is normal in size with greater than 50% respiratory variability, suggesting right atrial pressure of 3 mmHg. FINDINGS  Left Ventricle: Left ventricular ejection fraction, by estimation, is 60 to 65%. The left ventricle has normal function. The left ventricle has no regional wall motion abnormalities. The left ventricular internal cavity size was normal in size. There is  no left ventricular hypertrophy. Indeterminate diastolic filling due to E-A fusion. Right Ventricle: The right ventricular size is normal. No increase in right ventricular wall thickness. Right ventricular systolic function is normal. Left Atrium: Left atrial size was normal in size. Right Atrium: Right atrial size was normal in size. Pericardium: Large circumferential pericardial effusion with evidence of tamponade. A large pericardial effusion is present. The pericardial effusion is circumferential. There is excessive respiratory variation  in the mitral valve spectral Doppler velocities, diastolic collapse of the right atrial wall and diastolic collapse of the right ventricular free wall. Mitral Valve: The mitral valve is normal in structure. No evidence of mitral valve regurgitation. No evidence of mitral valve stenosis. Tricuspid Valve: The tricuspid valve is normal in structure. Tricuspid valve regurgitation is not demonstrated. No evidence of tricuspid stenosis. Aortic Valve: The aortic valve is tricuspid. Aortic valve regurgitation is not visualized. No aortic stenosis is present. Pulmonic Valve: The pulmonic valve was normal in structure. Pulmonic valve regurgitation is not visualized. No evidence of pulmonic stenosis. Aorta: The aortic root is normal in size and structure. Venous: The inferior vena cava is normal in size with greater than 50% respiratory variability, suggesting right atrial pressure of 3 mmHg. IAS/Shunts: No atrial level shunt detected by color flow Doppler.  LEFT VENTRICLE PLAX 2D LVIDd:         4.40 cm LVIDs:         2.90 cm LV PW:         0.90 cm LV IVS:        0.80 cm  LEFT ATRIUM         Index LA diam:    2.80 cm 1.41 cm/m   AORTA Ao Asc diam: 2.60 cm Jenkins Rouge MD Electronically signed by Jenkins Rouge MD Signature Date/Time: 04/29/2021/7:48:41 AM    Final     Cardiac Studies   Echocardiogram reviewed, as above.  Has virtually all of the classic echo signs of impending tamponade.  Patient Profile     56 y.o. male with protracted and complicated course of acute pancreatitis including peripancreatic necrosis and abscess formation, now with a large pericardial effusion and echo signs of tamponade, but hemodynamically compensated.  Assessment & Plan    The most likely cause of his pericardial effusion is neighboring inflammation from acute pancreatitis and lymphatic drainage obstruction. At risk for developing hemodynamic collapse, but currently appears remarkably well compensated. It is quite likely he will  need pericardiocentesis but this is not imminently necessary.   Avoid diuretics.   We need more faithful tracking of his intake and output. Watch for worsening tachycardia, reduced urine output and reduction in blood pressure and pulse pressure. If pericardial drainage is necessary, it is difficult to say what would be the best approach.  A  subxiphoid traditional approach runs a risk of passing through the area of peripancreatic inflammation.  Could do an ultrasound-guided precordial tap. We will follow with serial echocardiograms.  For questions or updates, please contact [place] Please consult [email or web address] for contact info under        Signed, Sanda Klein, MD  04/29/2021, 8:29 AM

## 2021-04-29 NOTE — Progress Notes (Signed)
ANTICOAGULATION CONSULT NOTE - Follow Up Consult  Pharmacy Consult for Heparin Indication:  history of pulmonary embolus  Allergies  Allergen Reactions   Penicillin G Hives    Patient Measurements: Height: 5\' 4"  (162.6 cm) Weight: 94 kg (207 lb 3.7 oz) IBW/kg (Calculated) : 59.2 Heparin Dosing Weight: 80  Vital Signs: Temp: 98.3 F (36.8 C) (02/17 0811) Temp Source: Oral (02/17 0811) BP: 126/88 (02/17 0445) Pulse Rate: 110 (02/16 2246)  Labs: Recent Labs    04/27/21 0412 04/28/21 0405 04/29/21 0303  HGB 8.3*  --  8.0*  HCT 24.9*  --  24.7*  PLT 406*  --  412*  HEPARINUNFRC 0.36 0.32 0.33  CREATININE 1.11  --  1.03     Estimated Creatinine Clearance: 82.8 mL/min (by C-G formula based on SCr of 1.03 mg/dL).   Assessment: 56 yo male on IV heparin due to a history of PE(03/03/2019) while Eliquis is on hold, last Eliquis dose 2/1.  RUQ drain placed 2/6 for pancreatic pseudocyst.   Heparin level remains therapeutic (0.33) on 2550 units/hr. CBC stable.   Of note, TTE on 2/16 w/large pericardial effusion and echo signs consistent w/early tamponade physiology. Has been hemodynamically stable and asymptomatic so far.  Goal of Therapy:  Heparin level 0.3-0.7 units/ml Monitor platelets by anticoagulation protocol: Yes   Plan:  Continue heparin drip at 2250 units/hr. Daily heparin level; daily CBC to resume 2/17. Eliquis on hold Follow up post-CT for plans for Eliquis.    Thank you for allowing [place] to participate in this patients care. Jens Som, PharmD 04/29/2021 8:17 AM  **Pharmacist phone directory can be found on [email or web address] listed under [place]**

## 2021-04-29 NOTE — Assessment & Plan Note (Addendum)
Cardiac tamponade  CT scan incidentally showed clinically significant pericardial effusion.  There was concern for tamponade although patient was noted to be hemodynamically stable.  Repeat echocardiograms continue to show tamponade features.   Etiology is thought to be secondary to inflammation from the pancreas.   Patient blood pressure started dropping.  He was in cardiogenic shock.  Required pressors briefly.   He underwent urgent pericardiocentesis on 2/19.  Bloody fluid was noted.  His anticoagulation has been discontinued. He was also noted to be tachycardic. After the procedure his blood pressure and heart rate have improved.  Cardiology continues to follow.  Echocardiogram from 2/20 showed trivial pericardial effusion. Drain removal will be deferred to cardiology. Pericardial fluid Gram stain showed gram-positive cocci.  Discussed with ID.  We will wait for final identification.  No change to antibiotic regimen at this time.

## 2021-04-29 NOTE — Progress Notes (Signed)
Mobility Specialist Progress Note    04/29/21 1547  Mobility  Activity Ambulated independently in hallway  Level of Assistance Standby assist, set-up cues, supervision of patient - no hands on  Assistive Device None  Distance Ambulated (ft) 320 ft  Activity Response Tolerated fair  $Mobility charge 1 Mobility   During Mobility: 124 HR, 98% SpO2  Pt received in chair and agreeable. No complaints. Returned to bed with call bell in reach.   [facility] Mobility Specialist  M.S. 5N: [phone]

## 2021-04-29 NOTE — Plan of Care (Signed)

## 2021-04-30 ENCOUNTER — Inpatient Hospital Stay (HOSPITAL_COMMUNITY): Payer: Medicare HMO

## 2021-04-30 DIAGNOSIS — I3139 Other pericardial effusion (noninflammatory): Secondary | ICD-10-CM | POA: Diagnosis not present

## 2021-04-30 DIAGNOSIS — K859 Acute pancreatitis without necrosis or infection, unspecified: Secondary | ICD-10-CM | POA: Diagnosis not present

## 2021-04-30 DIAGNOSIS — K8591 Acute pancreatitis with uninfected necrosis, unspecified: Secondary | ICD-10-CM | POA: Diagnosis not present

## 2021-04-30 LAB — CBC
HCT: 24.4 % — ABNORMAL LOW (ref 39.0–52.0)
Hemoglobin: 7.9 g/dL — ABNORMAL LOW (ref 13.0–17.0)
MCH: 28.1 pg (ref 26.0–34.0)
MCHC: 32.4 g/dL (ref 30.0–36.0)
MCV: 86.8 fL (ref 80.0–100.0)
Platelets: 445 10*3/uL — ABNORMAL HIGH (ref 150–400)
RBC: 2.81 MIL/uL — ABNORMAL LOW (ref 4.22–5.81)
RDW: 17.7 % — ABNORMAL HIGH (ref 11.5–15.5)
WBC: 15.3 10*3/uL — ABNORMAL HIGH (ref 4.0–10.5)
nRBC: 0 % (ref 0.0–0.2)

## 2021-04-30 LAB — GLUCOSE, CAPILLARY
Glucose-Capillary: 100 mg/dL — ABNORMAL HIGH (ref 70–99)
Glucose-Capillary: 104 mg/dL — ABNORMAL HIGH (ref 70–99)
Glucose-Capillary: 107 mg/dL — ABNORMAL HIGH (ref 70–99)
Glucose-Capillary: 115 mg/dL — ABNORMAL HIGH (ref 70–99)
Glucose-Capillary: 137 mg/dL — ABNORMAL HIGH (ref 70–99)
Glucose-Capillary: 92 mg/dL (ref 70–99)

## 2021-04-30 LAB — ECHOCARDIOGRAM LIMITED
Height: 64 in
Weight: 3315.72 oz

## 2021-04-30 LAB — HEPARIN LEVEL (UNFRACTIONATED): Heparin Unfractionated: 0.31 IU/mL (ref 0.30–0.70)

## 2021-04-30 NOTE — Progress Notes (Signed)
ANTICOAGULATION CONSULT NOTE - Follow Up Consult  Pharmacy Consult for Heparin Indication:  history of pulmonary embolus  Allergies  Allergen Reactions   Penicillin G Hives    Patient Measurements: Height: 5\' 4"  (162.6 cm) Weight: 94 kg (207 lb 3.7 oz) IBW/kg (Calculated) : 59.2 Heparin Dosing Weight: 80  Vital Signs: Temp: 98.6 F (37 C) (02/18 0300) Temp Source: Oral (02/18 0300) BP: 105/90 (02/18 0644) Pulse Rate: 100 (02/18 0300)  Labs: Recent Labs    04/28/21 0405 04/29/21 0303 04/30/21 0214  HGB  --  8.0* 7.9*  HCT  --  24.7* 24.4*  PLT  --  412* 445*  HEPARINUNFRC 0.32 0.33 0.31  CREATININE  --  1.03  --      Estimated Creatinine Clearance: 82.8 mL/min (by C-G formula based on SCr of 1.03 mg/dL).   Assessment: 56 yo male on IV heparin due to a history of PE(03/03/2019) while Eliquis is on hold, last Eliquis dose 2/1.  RUQ drain placed 2/6 for pancreatic pseudocyst.   Heparin level remains therapeutic (0.31) on 2550 units/hr. CBC stable.   Of note, TTE on 2/16 w/large pericardial effusion and echo signs consistent w/early tamponade physiology. Has been hemodynamically stable and asymptomatic so far. Continuing heparin while there is a possibility he will need a therapeutic thoracentesis.   Goal of Therapy:  Heparin level 0.3-0.7 units/ml Monitor platelets by anticoagulation protocol: Yes   Plan:  Continue heparin drip at 2250 units/hr. Daily heparin level; daily CBC to resume 2/17. Eliquis on hold- Follow up plans for restart   Pharmacy Antibiotic Note   Derrious Bologna is a 56 y.o. male admitted on 04/13/2021 with  Necrotizing pancreatitis with klebseilla and citrobacter isolated from abscess fluid . Citrobacter freundii has the potential for inducible AmpC production- sensitivities do not truly reflect available treatment options. Pharmacy is consulted for Meropenem dosing.   Plan: Meropenem 1 gram IV every 8 hours Continuing meropenem pending  repeat imaging later this week F/u ID recs   Height: 5\' 4"  (162.6 cm) Weight: 94 kg (207 lb 3.7 oz) IBW/kg (Calculated) : 59.2   Temp (24hrs), Avg:97.6 F (36.4 C), Min:97.5 F (36.4 C), Max:97.7 F (36.5 C)   Last Labs          Recent Labs  Lab 04/22/21 0335 04/23/21 0104 04/24/21 0320 04/26/21 0450 04/27/21 0412  WBC 15.5* 18.2* 16.5* 17.2* 16.5*  CREATININE 0.82 0.79 0.97 1.02 1.11      Estimated Creatinine Clearance: 76.8 mL/min (by C-G formula based on SCr of 1.11 mg/dL).         Allergies  Allergen Reactions   Penicillin G Hives      Antimicrobials this admission: 2/1 Flagyl >> x 1 2/1 Meropenem >>    Microbiology results: 2/1 blood>>ngtdF 2/1 urine>>ngF 2/6 drain>>ABUNDANT KLEBSIELLA PNEUMONIAE (resistance to amp only) ABUNDANT CITROBACTER FREUNDII (resistance to cefazolin only)   Adria Dill, PharmD PGY-1 Acute Care Resident  04/30/2021 7:29 AM

## 2021-04-30 NOTE — Plan of Care (Signed)

## 2021-04-30 NOTE — Progress Notes (Signed)
°  Echocardiogram 2D Echocardiogram has been performed.  Scott Foley 04/30/2021, 2:54 PM

## 2021-04-30 NOTE — Progress Notes (Signed)
[facility] PROGRESS NOTE   Scott Foley NOT:[id number] DOB: 09/05/1965 DOA: 04/13/2021  17 DOS: the patient was seen and examined on 04/30/2021  PCP: [name], MD  Brief History and Hospital Course:  56 y.o. male with medical history significant of hx of CAD, HTN, hx of PE on eliquis, HLD, hxo of CVA in 2014, IDA with history of recent hospitalizaion on 03/16/21 for recurrent pancreatitis, sepsis due to bacteremia from klebsiella pneumoniae, covid-19 and respiratory failure requiring home oxygen on discharge.  Presented with a 4-day history of diffuse worsening abdominal pain.  He has had abdominal issues for the past 2 months.  He had a cholecystectomy on 12/21 and underwent ERCP on 12/22 since the intraoperative cholangiogram showed a CBD stone.  After ERCP he developed pancreatitis.  He was managed conservatively and then discharged home on 12/28.  He was readmitted to [facility] on 1/4 for recurrent pancreatitis and COVID-19.  Noted to have sepsis respiratory failure.  Blood cultures were positive for Klebsiella pneumonia.  He was treated with Invanz for 10 days.  Discharged on 2 L of oxygen. He had a postop visit with surgeon on 04/12/21 when he complained of abdominal pain.  A CT scan was subsequently done which showed pancreatic necrosis with gas within the pancreatic fluid collections.  He was sent over to [facility] emergency department.  Interventional radiology was consulted for aspiration of the fluid collection which was performed on 2/6.   CT scan of the abdomen pelvis was repeated on 2/16.  Showed stable findings for the most part. Incidentally noted to have significant pericardial effusion with features concerning for tamponade.  Cardiology was consulted.  Urgent echocardiogram was performed.  Cardiology continues to monitor closely. Core track was removed once his oral intake improved significantly.  Consultants: Gastroenterology.  Interventional radiology.   General surgery.  Infectious disease  Procedures:  Drainage of fluid collection in the abdomen by interventional radiology on 2/6  Transthoracic echocardiogram 2/17    Subjective: Denies any chest pain shortness of breath.  No significant abdominal discomfort.  No nausea vomiting.     Assessment/Plan:   * Necrotizing pancreatitis- (present on admission) This is either a third episode of pancreatitis or sequelae of pancreatitis from his episode in January.  His lipase level was noted to be normal.  CT scan was done at [facility].  Do not have access to that report but according to the H&P it showed progressive severe pancreatitis with extensive fluid collection surrounding the pancreas and extending into the right retroperitoneum.  Many of these fluid collections contained gas concerning for infection.  Compression of splenic vein and SMV was noted although they appear to be patent.  Patient was started on meropenem.   Patient was seen by gastroenterology and general surgery.  Both of these services have signed off.   Interventional radiology consulted for percutaneous drainage which was done on 2/6  Patient was started on cortrak tube feedings.  Diet was slowly advanced.  Cortrak feeding tube was removed subsequently. CT scan of the abdomen pelvis was repeated on 2/16.   Evolving changes of necrotizing pancreatitis noted.  Mostly stable findings.  Fluid collection which was drained was noted to be decreased in size.  Wide patency of the superior mesenteric, splenic and portal veins were noted. ID recommends continuing meropenem. Drain to remain in place for now. Pain is reasonably well controlled.  Pericardial effusion CT scan incidentally showed clinically significant pericardial effusion.  There was some concern  for tamponade although patient was noted to be hemodynamically stable.  Cardiology was consulted urgently.  Patient underwent urgent echocardiogram. Etiology is thought to be  secondary to inflammation from the pancreas.   Cardiology feels that he will likely need pericardiocentesis but not imminently necessary.  They recommend avoiding diuretics.  Plan is to do serial echocardiograms at this time. Noted to be tachycardic this morning.  Denies any symptoms at this time.  He is on metoprolol.  Continue to monitor closely.  Normocytic anemia- (present on admission) Patient did have a significant drop in hemoglobin most of which was thought to be secondary to dilution. Anemia panel reviewed.  Vitamin B12 noted to be low normal at 254.  Being supplemented.  Folate 8.2.  Ferritin 1033.  TIBC 137.  Iron 16. He has been transfused 1 unit of PRBC during this hospital stay so far.   Hemoglobin is low but stable.  Sepsis ([place])- (present on admission) On admission he met sepsis criteria with tachycardia, fever to 100.8, WBC to 18.2.  Recently noted to have Klebsiella bacteremia earlier in January when he was hospitalized in [place].  Blood cultures done here in the hospital have been negative so far. Sepsis physiology improved.   Patient remains afebrile.  WBC elevated but slowly improving. . Cultures from fluid sent after aspiration 2/6- kleb/citrobacter Infectious disease continues to follow.  Remains on meropenem.  Chronic respiratory failure with hypoxia ([place])- (present on admission) Recently recovered from COVID-19 infection.  Was discharged on 2 L of oxygen by nasal cannula.  Admission chest x-ray showed opacities which could be from his recent infection rather than any infection.   Respiratory status is stable.  He has been weaned off of oxygen.  Continue to mobilize.  Chronic obstructive pulmonary disease ([place])- (present on admission) No signs of exacerbation. Continue trelegy daily and albuterol prn.  History of pulmonary embolism- (present on admission) Eliquis is currently on hold.  He is on heparin infusion.   Initially plan was to resume his apixaban after  the CT scan was performed.   However due to possible need for intervention on his pericardial effusion we will continue with IV heparin for now.  Essential hypertension- (present on admission) Sinus tachycardia  ACE inhibitor on hold due to borderline low blood pressures. Sinus tachycardia likely secondary to severe illness. He was started back on beta-blocker due to concern for rebound tachycardia.  Heart rate had improved.  Noted to be tachycardic this morning.  Could be related to pericardial effusion.  Cardiology is following. Blood pressure is reasonably well controlled  Hyperlipemia- (present on admission) Statin on hold currently.  Triglyceride was 121 in December.  Coronary artery disease with stable angina pectoris ([place])- (present on admission) Stable.  Plavix currently on hold.   Protein-calorie malnutrition, severe Feeding tube has been removed.  He is doing very well with oral intake.  Nutrition Status: Nutrition Problem: Severe Malnutrition Etiology: acute illness (pancreatitis) Signs/Symptoms: moderate muscle depletion, moderate fat depletion, energy intake < or equal to 50% for > or equal to 5 days, percent weight loss (11% weight loss within 3 months) Percent weight loss: 11 % Interventions: Tube feeding    Hypokalemia- (present on admission) Hypomagnesemia  Repleted.  Check every so often.  AKI (acute kidney injury) ([place])- (present on admission) Presented with creatinine of 2.49. This was likely prerenal in the setting of sepsis along with nausea and vomiting over the past few days.   Patient aggressively hydrated.  Renal function has improved  and back to normal.  Hypoglycemia Resolved with initiation of fluids and nutrition.   Obesity Estimated body mass index is 35.57 kg/m as calculated from the following:   Height as of this encounter: '5\' 4"'  (1.626 m).   Weight as of this encounter: 94 kg.    DVT Prophylaxis: On IV heparin infusion Code Status:  Full code Family Communication: Discussed with patient.  Wife being updated every few days. Disposition Plan: Hopefully return home when improved  Status is: Inpatient  Remains inpatient appropriate because: Acute pancreatitis, infected intra-abdominal fluid collection       Medications: Scheduled:  (feeding supplement) PROSource Plus  30 mL Oral BID BM   feeding supplement  237 mL Oral BID BM   fluticasone furoate-vilanterol  1 puff Inhalation Daily   And   umeclidinium bromide  1 puff Inhalation Daily   metoprolol tartrate  50 mg Oral BID   multivitamin with minerals  1 tablet Oral Daily   pantoprazole  40 mg Oral BID   sodium chloride flush  5 mL Intracatheter Q8H   vitamin B-12  1,000 mcg Per Tube Daily   Continuous:  heparin 2,550 Units/hr (04/29/21 1947)   meropenem (MERREM) IV 1 g (04/30/21 0927)   HWT:UUEKCMKLKJZPH, albuterol, morphine injection, ondansetron **OR** ondansetron (ZOFRAN) IV, oxyCODONE  Antibiotics: Anti-infectives (From admission, onward)    Start     Dose/Rate Route Frequency Ordered Stop   04/16/21 1600  meropenem (MERREM) 1 g in sodium chloride 0.9 % 100 mL IVPB        1 g 200 mL/hr over 30 Minutes Intravenous Every 8 hours 04/16/21 1234     04/13/21 2000  meropenem (MERREM) 1 g in sodium chloride 0.9 % 100 mL IVPB  Status:  Discontinued        1 g 200 mL/hr over 30 Minutes Intravenous Every 12 hours 04/13/21 1934 04/16/21 1234   04/13/21 1800  ceFEPIme (MAXIPIME) 2 g in sodium chloride 0.9 % 100 mL IVPB  Status:  Discontinued        2 g 200 mL/hr over 30 Minutes Intravenous Every 12 hours 04/13/21 1731 04/13/21 1852   04/13/21 1715  levofloxacin (LEVAQUIN) IVPB 750 mg  Status:  Discontinued        750 mg 100 mL/hr over 90 Minutes Intravenous  Once 04/13/21 1713 04/13/21 1728   04/13/21 1715  metroNIDAZOLE (FLAGYL) IVPB 500 mg  Status:  Discontinued        500 mg 100 mL/hr over 60 Minutes Intravenous  Once 04/13/21 1713 04/13/21 1852        Objective:  Vital Signs  Vitals:   04/30/21 0644 04/30/21 0747 04/30/21 0755 04/30/21 1000  BP: 105/90  (!) 110/91 101/87  Pulse:   (!) 120 (!) 122  Resp:   20 20  Temp:   98.5 F (36.9 C)   TempSrc:   Oral   SpO2:  96% 97% 97%  Weight:      Height:        Intake/Output Summary (Last 24 hours) at 04/30/2021 1040 Last data filed at 04/30/2021 0800 Gross per 24 hour  Intake 250 ml  Output 1370 ml  Net -1120 ml    Filed Weights   04/22/21 0454 04/24/21 0500 04/25/21 0500  Weight: 90.3 kg 94 kg 94 kg    General appearance: Awake alert.  In no distress Resp: Clear to auscultation bilaterally.  Normal effort Cardio: S1-S2 is tachycardic regular.  No S3-S4.  No rubs or bruit.  GI: Abdomen is soft.  Drain tube is noted on the right abdomen.  Mildly tender in the epigastric area. Extremities: No edema.   Neurologic: Alert and oriented x3.  No focal neurological deficits.       Lab Results:  Data Reviewed: I have personally reviewed labs and imaging study reports  CBC: Recent Labs  Lab 04/24/21 0320 04/26/21 0450 04/27/21 0412 04/29/21 0303 04/30/21 0214  WBC 16.5* 17.2* 16.5* 15.2* 15.3*  HGB 8.0* 8.4* 8.3* 8.0* 7.9*  HCT 25.4* 25.5* 24.9* 24.7* 24.4*  MCV 87.0 85.3 85.6 86.4 86.8  PLT 380 390 406* 412* 445*     Basic Metabolic Panel: Recent Labs  Lab 04/24/21 0320 04/26/21 0450 04/27/21 0412 04/29/21 0303  NA 135 133* 132* 132*  K 4.1 4.1 4.0 4.2  CL 101 100 100 100  CO2 24 21* 23 22  GLUCOSE 103* 104* 120* 115*  BUN 20 24* 27* 28*  CREATININE 0.97 1.02 1.11 1.03  CALCIUM 8.4* 8.9 8.9 9.0     GFR: Estimated Creatinine Clearance: 82.8 mL/min (by C-G formula based on SCr of 1.03 mg/dL).   CBG: Recent Labs  Lab 04/29/21 1610 04/29/21 2006 04/30/21 0006 04/30/21 0358 04/30/21 0755  GLUCAP 87 88 92 104* 115*      No results found for this or any previous visit (from the past 240 hour(s)).     Radiology Studies: CT ABDOMEN  PELVIS W CONTRAST  Addendum Date: 04/28/2021   ADDENDUM REPORT: 04/28/2021 19:43 ADDENDUM: These results were called by telephone at the time of interpretation on 04/28/2021 at 7:39 pm to provider Dr. Olena Heckle, Who verbally acknowledged these results. Electronically Signed   By: Fidela Salisbury M.D.   On: 04/28/2021 19:43   Result Date: 04/28/2021 CLINICAL DATA:  Necrotizing pancreatitis, follow-up examination EXAM: CT ABDOMEN AND PELVIS WITH CONTRAST TECHNIQUE: Multidetector CT imaging of the abdomen and pelvis was performed using the standard protocol following bolus administration of intravenous contrast. RADIATION DOSE REDUCTION: This exam was performed according to the departmental dose-optimization program which includes automated exposure control, adjustment of the mA and/or kV according to patient size and/or use of iterative reconstruction technique. CONTRAST:  138m OMNIPAQUE IOHEXOL 300 MG/ML  SOLN COMPARISON:  04/18/2021 FINDINGS: Lower chest: Moderate, complex appearing pericardial effusion demonstrating subtle pericardial enhancement has developed with evidence of cardiac tamponade and relative leftward shift of the interventricular septum when compared to prior examination. Small bilateral pleural effusions are again identified with associated bibasilar compressive atelectasis. Nodular densities within the subpleural right middle lobe are unchanged. Hepatobiliary: No focal liver abnormality is seen. Status post cholecystectomy. No biliary dilatation. Mild pneumobilia within the nondependent left hepatic lobe again noted. Pancreas: Findings in keeping with necrotizing pancreatitis are again identified with heterogeneous enhancement the pancreatic parenchyma best appreciated within the head and body of the pancreas, stable since prior examination. Acute peripancreatic necrotic collections adjacent to the mid body of the pancreas at axial image # 36/3 measuring 3.9 x 2.9 cm appear better defined though  a discrete capsule is not clearly identified to suggest walled off necrosis at this time. However, more loculated gas and complex fluid containing collections adjacent to the head of the pancreas measuring 3.0 x 3.9 cm at axial image # 38/3 and 2.2 x 3.8 cm at axial image # 47/3 now demonstrate more well-defined enhancing margins and are compatible with areas of walled-off necrosis. As noted previously, super infection is not excluded. Smaller peripancreatic acute necrotic collections adjacent to the tail of  the pancreas appear relatively stable since prior examination. Inflammatory changes are again identified tracking into the pericolic gutters bilaterally. Spleen: Unremarkable.  The splenic vein is patent. Adrenals/Urinary Tract: Adrenal glands are unremarkable. Kidneys are normal, without renal calculi, focal lesion, or hydronephrosis. Bladder is unremarkable. Stomach/Bowel: Nasoenteric feeding tube tip seen within the proximal jejunum beyond the mid lumen of [place]. Mild ascending colonic diverticulosis. The stomach, small bowel, and large bowel are otherwise unremarkable there is no evidence of obstruction or focal inflammation. Appendix normal. No free intraperitoneal gas or fluid. Vascular/Lymphatic: Previously noted narrowing of the portosplenic confluence, best appreciated on axial image # 36/3, appears improved since prior examination. The splenic vein, superior mesenteric vein, and portal vein appear patent. Extensive aortoiliac atherosclerotic calcification. Focal excrescence of the infrarenal abdominal aorta measuring 3.4 cm in transaxial dimension at axial image # 41/3, is stable possibly representing a a thrombosed penetrating atherosclerotic ulcer or focal dissection. This is unchanged since remote prior examination of 03/03/2019. No pathologic adenopathy within the abdomen and pelvis. Reproductive: Prostate is unremarkable. Other: The previously noted area of walled-off necrosis within the right  retroperitoneum within the iliac fossa has undergone percutaneous drainage with a cope loop catheter well position within the residual collection. The collection has decreased in size, now measuring 2.9 x 3.6 cm at axial image # 61/3. Tiny right fat containing inguinal hernia. Mild subcutaneous edema noted within the flanks bilaterally. Musculoskeletal: No acute bone abnormality. No lytic or blastic bone lesions are identified. Stable remote L1 compression fracture IMPRESSION: Interval development of a moderate pericardial effusion demonstrating complex features with pericardial enhancement and evidence of cardiac tamponade with mild relative leftward shift of the intraventricular septum when compared to prior examination of 04/18/2021. Correlation with echocardiography is recommended. Stable changes of anasarca with small bilateral pleural effusions and mild subcutaneous edema within the flanks bilaterally. Evolving changes of necrotizing pancreatitis. Stable enhancement of the residual pancreatic parenchyma. Increasing organization peripancreatic necrosis adjacent to the head and body of the pancreas with areas of more formal walled-off necrosis adjacent to the pancreatic head demonstrating slight interval decrease in size since prior examination. Intraluminal gas within areas of walled-off necrosis are again identified and superinfection is not excluded. Interval percutaneous drainage of area retroperitoneal walled-off necrosis within the right iliac fossa with interval decrease in size of loculated collection. Improved mass effect upon the portosplenic confluence with wide patency of the superior mesenteric vein, splenic vein, and portal vein. Attempts are being made at this time to contact the patient is managing clinician for direct verbal communication of these findings. Electronically Signed: By: Fidela Salisbury M.D. On: 04/28/2021 19:25   ECHOCARDIOGRAM LIMITED  Result Date: 04/29/2021    ECHOCARDIOGRAM  REPORT   Patient Name:   Scott Foley Date of Exam: 04/28/2021 Medical Rec #:  [id number]             Height:       64.0 in Accession #:    [id number]            Weight:       207.2 lb Date of Birth:  [birth year]             BSA:          1.986 m Patient Age:    55 years              BP:           122/89 mmHg Patient Gender: M  HR:           109 bpm. Exam Location:  Inpatient Procedure: Limited Echo, Cardiac Doppler and Limited Color Doppler                     STAT ECHO Reported to: Dr. Hassell Done on 04/28/2021 10:20:00 PM. Indications:    cardiac tamponade  History:        Patient has prior history of Echocardiogram examinations, most                 recent 02/09/2021. CAD, COPD; Risk Factors:Dyslipidemia.  Sonographer:    Johny Chess RDCS Referring Phys: [place]  1. Left ventricular ejection fraction, by estimation, is 60 to 65%. The left ventricle has normal function. The left ventricle has no regional wall motion abnormalities. Indeterminate diastolic filling due to E-A fusion.  2. Right ventricular systolic function is normal. The right ventricular size is normal.  3. Large circumferential pericardial effusion with evidence of tamponade . Large pericardial effusion. The pericardial effusion is circumferential.  4. The mitral valve is normal in structure. No evidence of mitral valve regurgitation. No evidence of mitral stenosis.  5. The aortic valve is tricuspid. Aortic valve regurgitation is not visualized. No aortic stenosis is present.  6. The inferior vena cava is normal in size with greater than 50% respiratory variability, suggesting right atrial pressure of 3 mmHg. FINDINGS  Left Ventricle: Left ventricular ejection fraction, by estimation, is 60 to 65%. The left ventricle has normal function. The left ventricle has no regional wall motion abnormalities. The left ventricular internal cavity size was normal in size. There is  no left ventricular  hypertrophy. Indeterminate diastolic filling due to E-A fusion. Right Ventricle: The right ventricular size is normal. No increase in right ventricular wall thickness. Right ventricular systolic function is normal. Left Atrium: Left atrial size was normal in size. Right Atrium: Right atrial size was normal in size. Pericardium: Large circumferential pericardial effusion with evidence of tamponade. A large pericardial effusion is present. The pericardial effusion is circumferential. There is excessive respiratory variation in the mitral valve spectral Doppler velocities, diastolic collapse of the right atrial wall and diastolic collapse of the right ventricular free wall. Mitral Valve: The mitral valve is normal in structure. No evidence of mitral valve regurgitation. No evidence of mitral valve stenosis. Tricuspid Valve: The tricuspid valve is normal in structure. Tricuspid valve regurgitation is not demonstrated. No evidence of tricuspid stenosis. Aortic Valve: The aortic valve is tricuspid. Aortic valve regurgitation is not visualized. No aortic stenosis is present. Pulmonic Valve: The pulmonic valve was normal in structure. Pulmonic valve regurgitation is not visualized. No evidence of pulmonic stenosis. Aorta: The aortic root is normal in size and structure. Venous: The inferior vena cava is normal in size with greater than 50% respiratory variability, suggesting right atrial pressure of 3 mmHg. IAS/Shunts: No atrial level shunt detected by color flow Doppler.  LEFT VENTRICLE PLAX 2D LVIDd:         4.40 cm LVIDs:         2.90 cm LV PW:         0.90 cm LV IVS:        0.80 cm  LEFT ATRIUM         Index LA diam:    2.80 cm 1.41 cm/m   AORTA Ao Asc diam: 2.60 cm Jenkins Rouge MD Electronically signed by Jenkins Rouge MD Signature Date/Time: 04/29/2021/7:48:41 AM  Final        LOS: 17 days   Alexiz Sustaita [employer] on [email or web address]  04/30/2021, 10:40 AM

## 2021-04-30 NOTE — Plan of Care (Signed)

## 2021-05-01 ENCOUNTER — Inpatient Hospital Stay (HOSPITAL_COMMUNITY): Payer: Medicare HMO

## 2021-05-01 ENCOUNTER — Encounter (HOSPITAL_COMMUNITY)
Admission: EM | Disposition: A | Discharge status: Home / Self Care | Payer: Self-pay | Source: Home / Self Care | Attending: Internal Medicine

## 2021-05-01 DIAGNOSIS — I3139 Other pericardial effusion (noninflammatory): Secondary | ICD-10-CM | POA: Diagnosis not present

## 2021-05-01 DIAGNOSIS — R570 Cardiogenic shock: Secondary | ICD-10-CM

## 2021-05-01 DIAGNOSIS — K8591 Acute pancreatitis with uninfected necrosis, unspecified: Secondary | ICD-10-CM | POA: Diagnosis not present

## 2021-05-01 DIAGNOSIS — I10 Essential (primary) hypertension: Secondary | ICD-10-CM | POA: Diagnosis not present

## 2021-05-01 DIAGNOSIS — Z86711 Personal history of pulmonary embolism: Secondary | ICD-10-CM | POA: Diagnosis not present

## 2021-05-01 DIAGNOSIS — I314 Cardiac tamponade: Secondary | ICD-10-CM

## 2021-05-01 DIAGNOSIS — D649 Anemia, unspecified: Secondary | ICD-10-CM | POA: Diagnosis not present

## 2021-05-01 HISTORY — PX: PERICARDIOCENTESIS: CATH118255

## 2021-05-01 LAB — BODY FLUID CELL COUNT WITH DIFFERENTIAL
Eos, Fluid: 0 %
Lymphs, Fluid: 30 %
Monocyte-Macrophage-Serous Fluid: 5 % — ABNORMAL LOW (ref 50–90)
Neutrophil Count, Fluid: 65 % — ABNORMAL HIGH (ref 0–25)
Total Nucleated Cell Count, Fluid: 2167 cu mm — ABNORMAL HIGH (ref 0–1000)

## 2021-05-01 LAB — BASIC METABOLIC PANEL
Anion gap: 14 (ref 5–15)
BUN: 37 mg/dL — ABNORMAL HIGH (ref 6–20)
CO2: 19 mmol/L — ABNORMAL LOW (ref 22–32)
Calcium: 9.5 mg/dL (ref 8.9–10.3)
Chloride: 97 mmol/L — ABNORMAL LOW (ref 98–111)
Creatinine, Ser: 1.17 mg/dL (ref 0.61–1.24)
GFR, Estimated: >60 mL/min (ref >60)
Glucose, Bld: 110 mg/dL — ABNORMAL HIGH (ref 70–99)
Potassium: 5 mmol/L (ref 3.5–5.1)
Sodium: 130 mmol/L — ABNORMAL LOW (ref 135–145)

## 2021-05-01 LAB — CBC
HCT: 24.2 % — ABNORMAL LOW (ref 39.0–52.0)
Hemoglobin: 7.8 g/dL — ABNORMAL LOW (ref 13.0–17.0)
MCH: 28.1 pg (ref 26.0–34.0)
MCHC: 32.2 g/dL (ref 30.0–36.0)
MCV: 87.1 fL (ref 80.0–100.0)
Platelets: 461 10*3/uL — ABNORMAL HIGH (ref 150–400)
RBC: 2.78 MIL/uL — ABNORMAL LOW (ref 4.22–5.81)
RDW: 17.4 % — ABNORMAL HIGH (ref 11.5–15.5)
WBC: 15.1 10*3/uL — ABNORMAL HIGH (ref 4.0–10.5)
nRBC: 0 % (ref 0.0–0.2)

## 2021-05-01 LAB — SURGICAL PCR SCREEN
MRSA, PCR: NEGATIVE
Staphylococcus aureus: NEGATIVE

## 2021-05-01 LAB — GRAM STAIN

## 2021-05-01 LAB — HEPARIN LEVEL (UNFRACTIONATED)
Heparin Unfractionated: 0.16 IU/mL — ABNORMAL LOW (ref 0.30–0.70)
Heparin Unfractionated: <0.1 IU/mL — ABNORMAL LOW (ref 0.30–0.70)

## 2021-05-01 LAB — GLUCOSE, CAPILLARY
Glucose-Capillary: 107 mg/dL — ABNORMAL HIGH (ref 70–99)
Glucose-Capillary: 90 mg/dL (ref 70–99)

## 2021-05-01 LAB — ECHOCARDIOGRAM LIMITED
Height: 64 in
Weight: 3315.72 oz

## 2021-05-01 LAB — MAGNESIUM: Magnesium: 2.4 mg/dL (ref 1.7–2.4)

## 2021-05-01 SURGERY — PERICARDIOCENTESIS
Anesthesia: LOCAL

## 2021-05-01 MED ORDER — FENTANYL CITRATE (PF) 100 MCG/2ML IJ SOLN
INTRAMUSCULAR | Status: AC
  Filled 2021-05-01: qty 2

## 2021-05-01 MED ORDER — SODIUM CHLORIDE 0.9 % IV SOLN
250.0000 mL | INTRAVENOUS | Status: DC
Start: 2021-05-01 — End: 2021-05-10

## 2021-05-01 MED ORDER — HEPARIN (PORCINE) IN NACL 1000-0.9 UT/500ML-% IV SOLN
INTRAVENOUS | Status: AC
  Filled 2021-05-01: qty 500

## 2021-05-01 MED ORDER — METOPROLOL TARTRATE 25 MG PO TABS: 25.0000 mg | ORAL_TABLET | Freq: Two times a day (BID) | ORAL | Status: DC

## 2021-05-01 MED ORDER — NOREPINEPHRINE 4 MG/250ML-% IV SOLN: 0.0000 ug/min | INTRAVENOUS | Status: DC

## 2021-05-01 MED ORDER — CHLORHEXIDINE GLUCONATE CLOTH 2 % EX PADS
6.0000 | MEDICATED_PAD | Freq: Every day | CUTANEOUS | Status: DC
  Administered 2021-05-01 – 2021-05-04 (×4): 6 via TOPICAL

## 2021-05-01 MED ORDER — LIDOCAINE HCL (PF) 1 % IJ SOLN
INTRAMUSCULAR | Status: AC
  Filled 2021-05-01: qty 30

## 2021-05-01 MED ORDER — FENTANYL CITRATE (PF) 100 MCG/2ML IJ SOLN
INTRAMUSCULAR | Status: DC | PRN
  Administered 2021-05-01: 25 ug via INTRAVENOUS

## 2021-05-01 MED ORDER — NOREPINEPHRINE 4 MG/250ML-% IV SOLN: 2.0000 ug/min | INTRAVENOUS | Status: DC

## 2021-05-01 MED ORDER — HEPARIN (PORCINE) IN NACL 1000-0.9 UT/500ML-% IV SOLN
INTRAVENOUS | Status: DC | PRN
  Administered 2021-05-01: 500 mL

## 2021-05-01 MED ORDER — LIDOCAINE HCL (PF) 1 % IJ SOLN
INTRAMUSCULAR | Status: DC | PRN
  Administered 2021-05-01: 10 mL

## 2021-05-01 MED ORDER — MIDAZOLAM HCL 2 MG/2ML IJ SOLN
INTRAMUSCULAR | Status: DC | PRN
  Administered 2021-05-01: 1 mg via INTRAVENOUS

## 2021-05-01 MED ORDER — MIDAZOLAM HCL 2 MG/2ML IJ SOLN
INTRAMUSCULAR | Status: AC
  Filled 2021-05-01: qty 2

## 2021-05-01 MED ORDER — MUPIROCIN 2 % EX OINT
1.0000 "application " | TOPICAL_OINTMENT | Freq: Two times a day (BID) | CUTANEOUS | Status: AC
  Administered 2021-05-01 – 2021-05-05 (×9): 1 via NASAL
  Filled 2021-05-01: qty 22

## 2021-05-01 SURGICAL SUPPLY — 9 items
ELECT DEFIB PAD ADLT CADENCE (PAD) ×1 IMPLANT
GLIDESHEATH SLEND SS 6F .021 (SHEATH) IMPLANT
GUIDEWIRE INQWIRE 1.5J.035X260 (WIRE) IMPLANT
INQWIRE 1.5J .035X260CM (WIRE)
KIT ENCORE 26 ADVANTAGE (KITS) IMPLANT
PACK CARDIAC CATHETERIZATION (CUSTOM PROCEDURE TRAY) ×2 IMPLANT
PROTECTION STATION PRESSURIZED (MISCELLANEOUS) ×2
STATION PROTECTION PRESSURIZED (MISCELLANEOUS) IMPLANT
TRAY PERICARDIOCENTESIS 6FX60 (TRAY / TRAY PROCEDURE) ×1 IMPLANT

## 2021-05-01 NOTE — Progress Notes (Signed)
Mobility Specialist Progress Note:   05/01/21 1206  Mobility  Activity Off unit   Pt off unit. Will follow-up as time allows.   [facility] Public librarian Phone [phone]

## 2021-05-01 NOTE — Progress Notes (Signed)
Pericardiocentesis shows 725 cc pericardial fluid removed.  Fluid is bloody.  Suspect this could be related to being on Eliquis and having inflammation related to severe necrotizing pancreatitis.  Review of laboratory data shows that his hemoglobin was 11.1 on 04/13/2021.  He did have a drop down to 8.4 as of 04/15/2021.  Suspect this could have been the time when he bled into the pericardial space.  He will be transferred to the 2 heart ICU and remain under the care of of the hospital medicine service.  Cardiology will manage the pericardial drain.  Ongoing discussions will need to be had regarding his ability to tolerate anticoagulation.  May need to consider pericardial window if anticoagulation is reinitiated.  Cardiology will continue with these discussions while he is here.  [place] Bells T. Audie Box, MD, [place]  [address] [place], [place] [address] [phone]  12:53 PM

## 2021-05-01 NOTE — Progress Notes (Signed)
ANTICOAGULATION CONSULT NOTE - Follow Up Consult  Pharmacy Consult for Heparin Indication:  history of pulmonary embolus  Allergies  Allergen Reactions   Penicillin G Hives    Patient Measurements: Height: 5\' 4"  (162.6 cm) Weight: 94 kg (207 lb 3.7 oz) IBW/kg (Calculated) : 59.2 Heparin Dosing Weight: 80  Vital Signs: Temp: 97.5 F (36.4 C) (02/19 0500) Temp Source: Oral (02/19 0500) BP: 97/68 (02/19 0500) Pulse Rate: 100 (02/19 0500)  Labs: Recent Labs    04/29/21 0303 04/30/21 0214 05/01/21 0220 05/01/21 0917  HGB 8.0* 7.9* 7.8*  --   HCT 24.7* 24.4* 24.2*  --   PLT 412* 445* 461*  --   HEPARINUNFRC 0.33 0.31 0.16* <0.10*  CREATININE 1.03  --  1.17  --      Estimated Creatinine Clearance: 72.9 mL/min (by C-G formula based on SCr of 1.17 mg/dL).   Assessment: 56 yo male on IV heparin due to a history of PE(03/03/2019) while Eliquis is on hold, last Eliquis dose 2/1.  RUQ drain placed 2/6 for pancreatic pseudocyst.   Of note, TTE on 2/16 w/large pericardial effusion and echo signs consistent w/early tamponade physiology. Has been hemodynamically stable and asymptomatic so far. Continuing heparin while there is a possibility he will need a therapeutic thoracentesis.   Heparin was inadvertently paused overnight 2/18 and AM HL subtherapeutic at 0.16. Based on [facility], it does not appear as if it was off for more than 1 hour (nurse called main pharmacy 2111 2/18 and new bag hung 1947). Upon repeat, heparin level undetectable with heparin running at 2550 units/h. Patient has remained therapeutic on this rate since 2/14. Per lab, heparin was drawn from the hand on the opposite side heparin was running. No signs of bleeding noted per RN. CBC stable.  Update: 2/19 1130- CODE STEMI called for patient due to tachycardia and HoTN, believed to be in cardiogenic shock d/t cardiac tamponade. Plan for urgent pericardiocentesis. Heparin gtt stopped.    Goal of Therapy:  Heparin level  0.3-0.7 units/ml Monitor platelets by anticoagulation protocol: Yes   Plan:  Holding heparin gtt for urgent pericardiocentesis  F/U [facility] plans   Adria Dill, PharmD PGY-1 Acute Care Resident  05/01/2021 11:40 AM

## 2021-05-01 NOTE — Progress Notes (Signed)
[facility] PROGRESS NOTE   Scott Foley HDQ:[id number] DOB: 30-Jul-1965 DOA: 04/13/2021  18 DOS: the patient was seen and examined on 05/01/2021  PCP: [name], MD  Brief History and Hospital Course:  56 y.o. male with medical history significant of hx of CAD, HTN, hx of PE on eliquis, HLD, hxo of CVA in 2014, IDA with history of recent hospitalizaion on 03/16/21 for recurrent pancreatitis, sepsis due to bacteremia from klebsiella pneumoniae, covid-19 and respiratory failure requiring home oxygen on discharge.  Presented with a 4-day history of diffuse worsening abdominal pain.  He has had abdominal issues for the past 2 months.  He had a cholecystectomy on 12/21 and underwent ERCP on 12/22 since the intraoperative cholangiogram showed a CBD stone.  After ERCP he developed pancreatitis.  He was managed conservatively and then discharged home on 12/28.  He was readmitted to [facility] on 1/4 for recurrent pancreatitis and COVID-19.  Noted to have sepsis respiratory failure.  Blood cultures were positive for Klebsiella pneumonia.  He was treated with Invanz for 10 days.  Discharged on 2 L of oxygen. He had a postop visit with surgeon on 04/12/21 when he complained of abdominal pain.  A CT scan was subsequently done which showed pancreatic necrosis with gas within the pancreatic fluid collections.  He was sent over to [facility] emergency department.  Interventional radiology was consulted for aspiration of the fluid collection which was performed on 2/6.   CT scan of the abdomen pelvis was repeated on 2/16.  Showed stable findings for the most part. Incidentally noted to have significant pericardial effusion with features concerning for tamponade.  Cardiology was consulted.  Urgent echocardiogram was performed.  Cardiology continues to monitor closely. Core track was removed once his oral intake improved significantly.  Consultants: Gastroenterology.  Interventional radiology.   General surgery.  Infectious disease  Procedures:  Drainage of fluid collection in the abdomen by interventional radiology on 2/6  Transthoracic echocardiogram 2/17    Subjective: Patient mentions that he feels well.  Mentions little shortness of breath overnight.  Denies any chest pain.  No new cough.  No nausea vomiting.  Denies any dizziness or lightheadedness   Assessment/Plan:   * Necrotizing pancreatitis- (present on admission) This is either a third episode of pancreatitis or sequelae of pancreatitis from his episode in January.  His lipase level was noted to be normal.  CT scan was done at [facility].  Do not have access to that report but according to the H&P it showed progressive severe pancreatitis with extensive fluid collection surrounding the pancreas and extending into the right retroperitoneum.  Many of these fluid collections contained gas concerning for infection.  Compression of splenic vein and SMV was noted although they appear to be patent.  Patient was started on meropenem.   Patient was seen by gastroenterology and general surgery.  Both of these services have signed off.   Interventional radiology consulted for percutaneous drainage which was done on 2/6  Patient was started on cortrak tube feedings.  Diet was slowly advanced.  Cortrak feeding tube was removed subsequently. CT scan of the abdomen pelvis was repeated on 2/16.   Evolving changes of necrotizing pancreatitis noted.  Mostly stable findings.  Fluid collection which was drained was noted to be decreased in size.  Wide patency of the superior mesenteric, splenic and portal veins were noted. ID recommends continuing meropenem. Drain to remain in place for now. Pain is reasonably well controlled.  Pericardial  effusion CT scan incidentally showed clinically significant pericardial effusion.  There was some concern for tamponade although patient was noted to be hemodynamically stable.  Cardiology was  consulted urgently.  Patient underwent urgent echocardiogram. Etiology is thought to be secondary to inflammation from the pancreas.   Cardiology feels that he will likely need pericardiocentesis but not imminently necessary.  They recommend avoiding diuretics.  Plan is to do serial echocardiograms at this time. Heart rate seems to be better this morning.  However he is a little bit more dyspneic this morning.  Saturations noted to be in the mid 90s on 2 L of oxygen by nasal cannula.  Normocytic anemia- (present on admission) Patient did have a significant drop in hemoglobin most of which was thought to be secondary to dilution. Anemia panel reviewed.  Vitamin B12 noted to be low normal at 254.  Being supplemented.  Folate 8.2.  Ferritin 1033.  TIBC 137.  Iron 16. He has been transfused 1 unit of PRBC during this hospital stay so far.   Hemoglobin remained stable.  Sepsis ([place])- (present on admission) On admission he met sepsis criteria with tachycardia, fever to 100.8, WBC to 18.2.  Recently noted to have Klebsiella bacteremia earlier in January when he was hospitalized in [place].  Blood cultures done here in the hospital have been negative so far. Sepsis physiology improved.   Patient remains afebrile.  WBC elevated but slowly improving. . Cultures from fluid sent after aspiration 2/6- kleb/citrobacter Infectious disease continues to follow.  Remains on meropenem.  Chronic respiratory failure with hypoxia ([place])- (present on admission) Recently recovered from COVID-19 infection.  Was discharged on 2 L of oxygen by nasal cannula.  Admission chest x-ray showed opacities which could be from his recent infection rather than any infection.   Little bit more dyspneic this morning and noted to be back on oxygen.  We will do a chest x-ray.   Chronic obstructive pulmonary disease ([place])- (present on admission) No signs of exacerbation. Continue trelegy daily and albuterol prn.  History of  pulmonary embolism- (present on admission) Eliquis is currently on hold.  He is on heparin infusion.   Initially plan was to resume his apixaban after the CT scan was performed.  However due to possible need for intervention on his pericardial effusion we will continue with IV heparin for now.  Essential hypertension- (present on admission) Sinus tachycardia  ACE inhibitor on hold due to borderline low blood pressures. Sinus tachycardia likely secondary to severe illness. He was started back on beta-blocker due to concern for rebound tachycardia.  Heart rate had improved.  Noted to be tachycardic again yesterday.  But heart rate seems to be stable this morning.  Continue to monitor closely.   Blood pressure is noted to be borderline low.  Continue to monitor.  He is asymptomatic.   Hyperlipemia- (present on admission) Statin on hold currently.  Triglyceride was 121 in December.  Coronary artery disease with stable angina pectoris ([place])- (present on admission) Stable.  Plavix currently on hold.   Protein-calorie malnutrition, severe Feeding tube has been removed.  He is doing very well with oral intake.  Nutrition Status: Nutrition Problem: Severe Malnutrition Etiology: acute illness (pancreatitis) Signs/Symptoms: moderate muscle depletion, moderate fat depletion, energy intake < or equal to 50% for > or equal to 5 days, percent weight loss (11% weight loss within 3 months) Percent weight loss: 11 % Interventions: Tube feeding    Hypokalemia- (present on admission) Hypomagnesemia  Repleted.  Check  every so often.  AKI (acute kidney injury) ([place])- (present on admission) Presented with creatinine of 2.49. This was likely prerenal in the setting of sepsis along with nausea and vomiting over the past few days.   Patient aggressively hydrated.  Renal function improved.  Patient told to stay well-hydrated  Hypoglycemia Resolved with initiation of fluids and  nutrition.   Obesity Estimated body mass index is 35.57 kg/m as calculated from the following:   Height as of this encounter: '5\' 4"'  (1.626 m).   Weight as of this encounter: 94 kg.    DVT Prophylaxis: On IV heparin infusion Code Status: Full code Family Communication: Discussed with patient.  Wife being updated every few days. Disposition Plan: Hopefully return home when improved  Status is: Inpatient  Remains inpatient appropriate because: Acute pancreatitis, infected intra-abdominal fluid collection       Medications: Scheduled:  (feeding supplement) PROSource Plus  30 mL Oral BID BM   feeding supplement  237 mL Oral BID BM   fluticasone furoate-vilanterol  1 puff Inhalation Daily   And   umeclidinium bromide  1 puff Inhalation Daily   metoprolol tartrate  50 mg Oral BID   multivitamin with minerals  1 tablet Oral Daily   pantoprazole  40 mg Oral BID   sodium chloride flush  5 mL Intracatheter Q8H   vitamin B-12  1,000 mcg Per Tube Daily   Continuous:  heparin 2,550 Units/hr (04/30/21 2117)   meropenem (MERREM) IV 1 g (05/01/21 0004)   JEH:UDJSHFWYOVZCH, albuterol, morphine injection, ondansetron **OR** ondansetron (ZOFRAN) IV, oxyCODONE  Antibiotics: Anti-infectives (From admission, onward)    Start     Dose/Rate Route Frequency Ordered Stop   04/16/21 1600  meropenem (MERREM) 1 g in sodium chloride 0.9 % 100 mL IVPB        1 g 200 mL/hr over 30 Minutes Intravenous Every 8 hours 04/16/21 1234     04/13/21 2000  meropenem (MERREM) 1 g in sodium chloride 0.9 % 100 mL IVPB  Status:  Discontinued        1 g 200 mL/hr over 30 Minutes Intravenous Every 12 hours 04/13/21 1934 04/16/21 1234   04/13/21 1800  ceFEPIme (MAXIPIME) 2 g in sodium chloride 0.9 % 100 mL IVPB  Status:  Discontinued        2 g 200 mL/hr over 30 Minutes Intravenous Every 12 hours 04/13/21 1731 04/13/21 1852   04/13/21 1715  levofloxacin (LEVAQUIN) IVPB 750 mg  Status:  Discontinued        750  mg 100 mL/hr over 90 Minutes Intravenous  Once 04/13/21 1713 04/13/21 1728   04/13/21 1715  metroNIDAZOLE (FLAGYL) IVPB 500 mg  Status:  Discontinued        500 mg 100 mL/hr over 60 Minutes Intravenous  Once 04/13/21 1713 04/13/21 1852       Objective:  Vital Signs  Vitals:   04/30/21 2104 05/01/21 0001 05/01/21 0113 05/01/21 0500  BP: 94/82 1'02/81 96/77 97/68 '  Pulse: (!) 111 (!) 106 99 100  Resp: '15 18 14 14  ' Temp:   98.7 F (37.1 C) (!) 97.5 F (36.4 C)  TempSrc: Other (Comment)  Oral Oral  SpO2: 96% 98% 97% 97%  Weight:      Height:        Intake/Output Summary (Last 24 hours) at 05/01/2021 8850 Last data filed at 05/01/2021 0004 Gross per 24 hour  Intake 250 ml  Output 310 ml  Net -60 ml  Filed Weights   04/22/21 0454 04/24/21 0500 04/25/21 0500  Weight: 90.3 kg 94 kg 94 kg    General appearance: Awake alert.  In no distress Resp: Noted to be slightly tachypneic this morning.  No use of accessory muscles.  good air entry bilaterally.  No definite wheezing rales or rhonchi. Cardio: S1-S2 is normal regular.  No S3-S4.  No rubs murmurs or bruit GI: Abdomen is soft.  Nontender nondistended.  Bowel sounds are present normal.  No masses organomegaly Extremities: No edema.   Neurologic: Alert and oriented x3.  No focal neurological deficits.        Lab Results:  Data Reviewed: I have personally reviewed labs and imaging study reports  CBC: Recent Labs  Lab 04/26/21 0450 04/27/21 0412 04/29/21 0303 04/30/21 0214 05/01/21 0220  WBC 17.2* 16.5* 15.2* 15.3* 15.1*  HGB 8.4* 8.3* 8.0* 7.9* 7.8*  HCT 25.5* 24.9* 24.7* 24.4* 24.2*  MCV 85.3 85.6 86.4 86.8 87.1  PLT 390 406* 412* 445* 461*     Basic Metabolic Panel: Recent Labs  Lab 04/26/21 0450 04/27/21 0412 04/29/21 0303 05/01/21 0220  NA 133* 132* 132* 130*  K 4.1 4.0 4.2 5.0  CL 100 100 100 97*  CO2 21* 23 22 19*  GLUCOSE 104* 120* 115* 110*  BUN 24* 27* 28* 37*  CREATININE 1.02 1.11 1.03  1.17  CALCIUM 8.9 8.9 9.0 9.5  MG  --   --   --  2.4     GFR: Estimated Creatinine Clearance: 72.9 mL/min (by C-G formula based on SCr of 1.17 mg/dL).   CBG: Recent Labs  Lab 04/30/21 0755 04/30/21 1204 04/30/21 1550 04/30/21 2044 05/01/21 0811  GLUCAP 115* 137* 107* 100* 107*      No results found for this or any previous visit (from the past 240 hour(s)).     Radiology Studies: ECHOCARDIOGRAM LIMITED  Result Date: 04/30/2021    ECHOCARDIOGRAM LIMITED REPORT   Patient Name:   Scott Foley Date of Exam: 04/30/2021 Medical Rec #:  [id number]             Height:       64.0 in Accession #:    [id number]            Weight:       207.2 lb Date of Birth:  [birth year]             BSA:          1.986 m Patient Age:    72 years              BP:           101/72 mmHg Patient Gender: M                     HR:           106 bpm. Exam Location:  Inpatient Procedure: Limited Echo, Cardiac Doppler and Color Doppler Indications:    Pericardial effusion  History:        Patient has prior history of Echocardiogram examinations, most                 recent 04/28/2021. CAD, COPD; Risk Factors:Hypertension and                 Dyslipidemia.  Sonographer:    Clayton Lefort RDCS (AE) Referring Phys: [place]  1. Respiratory variation is 34% across the MV. The RA is not well  visualized. There is evidence of early RV diastolic collapse. Similar findings of tamponade physiology to 04/28/2021 echo. Chart reviewed, hemodynamics stable since last echo. Appears plan  has been close inpatient monitoring, potential pericardiocentesis if becomes clinically indicated. . Large pericardial effusion. The pericardial effusion is circumferential.  2. The inferior vena cava is dilated in size with <50% respiratory variability, suggesting right atrial pressure of 15 mmHg.  3. Limited echo to evaluate pericardial effusion FINDINGS  Right Atrium: Right atrial size was not well visualized. Pericardium:  Respiratory variation is 34% across the MV. The RA is not well visualized. There is evidence of early RV diastolic collapse. Similar findings of tamponade physiology to 04/28/2021 echo. Chart reviewed, hemodynamics stable since last echo. Appears plan has been close inpatient monitoring, potential pericardiocentesis if becomes clinically indicated. A large pericardial effusion is present. The pericardial effusion is circumferential. Venous: The inferior vena cava is dilated in size with less than 50% respiratory variability, suggesting right atrial pressure of 15 mmHg. IVC IVC diam: 2.20 cm Carlyle Dolly MD Electronically signed by Carlyle Dolly MD Signature Date/Time: 04/30/2021/6:05:11 PM    Final        LOS: 18 days   Bonnielee Haff  [facility] Pager on [email or web address]  05/01/2021, 9:22 AM

## 2021-05-01 NOTE — Plan of Care (Signed)

## 2021-05-01 NOTE — Progress Notes (Signed)
°   04/30/21 2100  Assess: MEWS Score  Temp 97.8 F (36.6 C)  BP 103/79  Pulse Rate (!) 115  Resp 18  Level of Consciousness Alert  SpO2 99 %  O2 Device Nasal Cannula  Assess: MEWS Score  MEWS Temp 0  MEWS Systolic 0  MEWS Pulse 2  MEWS RR 0  MEWS LOC 0  MEWS Score 2  MEWS Score Color Yellow  Assess: if the MEWS score is Yellow or Red  Were vital signs taken at a resting state? Yes  Focused Assessment No change from prior assessment  Early Detection of Sepsis Score *See Row Information* Low  MEWS guidelines implemented *See Row Information* No, vital signs rechecked  Treat  Pain Scale 0-10  Pain Score 5  Pain Type Acute pain  Pain Location Abdomen  Pain Orientation Right;Left  Pain Descriptors / Indicators Aching  Pain Frequency Occasional  Pain Onset On-going  Patients Stated Pain Goal 3  Pain Intervention(s) Medication (See eMAR)  Multiple Pain Sites No  Take Vital Signs  Increase Vital Sign Frequency  Yellow: Q 2hr X 2 then Q 4hr X 2, if remains yellow, continue Q 4hrs  Escalate  MEWS: Escalate Yellow: discuss with charge nurse/RN and consider discussing with provider and RRT  Notify: Charge Nurse/RN  Name of Charge Nurse/RN Notified Estill Bamberg  Date Charge Nurse/RN Notified 04/30/21  Time Charge Nurse/RN Notified 2110

## 2021-05-01 NOTE — Progress Notes (Signed)
°   05/01/21 1104  Clinical Encounter Type  Visited With Patient not available  Referral From Nurse  Consult/Referral To Chaplain   Paged for Code STEMI to 4N24. Staff on floor stated that Patient not available and family not present. [place], M.Min. [phone].

## 2021-05-01 NOTE — Progress Notes (Signed)
Cardiology Progress Note  Patient ID: Scott Foley MRN: [id number] DOB: [birth year] Date of Encounter: 05/01/2021  Primary Cardiologist: None  Subjective   Chief Complaint: Weakness  HPI: Tachycardia and hypotension noted.  And pericardial tamponade.  Plan for urgent pericardiocentesis today.  ROS:  All other ROS reviewed and negative. Pertinent positives noted in the HPI.     Inpatient Medications  Scheduled Meds:  (feeding supplement) PROSource Plus  30 mL Oral BID BM   feeding supplement  237 mL Oral BID BM   fluticasone furoate-vilanterol  1 puff Inhalation Daily   And   umeclidinium bromide  1 puff Inhalation Daily   multivitamin with minerals  1 tablet Oral Daily   pantoprazole  40 mg Oral BID   sodium chloride flush  5 mL Intracatheter Q8H   vitamin B-12  1,000 mcg Per Tube Daily   Continuous Infusions:  meropenem (MERREM) IV 1 g (05/01/21 0004)   PRN Meds: acetaminophen, albuterol, morphine injection, ondansetron **OR** ondansetron (ZOFRAN) IV, oxyCODONE   Vital Signs   Vitals:   04/30/21 2104 05/01/21 0001 05/01/21 0113 05/01/21 0500  BP: 94/82 102/81 96/77 97/68   Pulse: (!) 111 (!) 106 99 100  Resp: 15 18 14 14   Temp:   98.7 F (37.1 C) (!) 97.5 F (36.4 C)  TempSrc: Other (Comment)  Oral Oral  SpO2: 96% 98% 97% 97%  Weight:      Height:        Intake/Output Summary (Last 24 hours) at 05/01/2021 1108 Last data filed at 05/01/2021 0004 Gross per 24 hour  Intake 250 ml  Output 310 ml  Net -60 ml   Last 3 Weights 04/25/2021 04/24/2021 04/22/2021  Weight (lbs) 207 lb 3.7 oz 207 lb 3.7 oz 199 lb 1.2 oz  Weight (kg) 94 kg 94 kg 90.3 kg      Telemetry  Overnight telemetry shows sinus tachycardia 100s, which I personally reviewed.   Physical Exam   Vitals:   04/30/21 2104 05/01/21 0001 05/01/21 0113 05/01/21 0500  BP: 94/82 102/81 96/77 97/68   Pulse: (!) 111 (!) 106 99 100  Resp: 15 18 14 14   Temp:   98.7 F (37.1 C) (!) 97.5 F (36.4 C)   TempSrc: Other (Comment)  Oral Oral  SpO2: 96% 98% 97% 97%  Weight:      Height:        Intake/Output Summary (Last 24 hours) at 05/01/2021 1108 Last data filed at 05/01/2021 0004 Gross per 24 hour  Intake 250 ml  Output 310 ml  Net -60 ml    Last 3 Weights 04/25/2021 04/24/2021 04/22/2021  Weight (lbs) 207 lb 3.7 oz 207 lb 3.7 oz 199 lb 1.2 oz  Weight (kg) 94 kg 94 kg 90.3 kg    Body mass index is 35.57 kg/m.  General: Well nourished, well developed, in no acute distress Head: Atraumatic, normal size  Eyes: PEERLA, EOMI  Neck: Supple, JVD 8-12 cmH2O Endocrine: No thryomegaly Cardiac: Normal S1, S2; tachycardia, distant heart sounds Lungs: Diminished breath sounds Abd: Soft, right lower quadrant drain in place Ext: No edema, pulses 2+ Musculoskeletal: No deformities, BUE and BLE strength normal and equal Skin: Warm and dry, no rashes   Neuro: Alert and oriented to person, place, time, and situation, CNII-XII grossly intact, no focal deficits  Psych: Normal mood and affect   Labs  High Sensitivity Troponin:  No results for input(s): TROPONINIHS in the last 720 hours.   Cardiac EnzymesNo results for input(s): TROPONINI  in the last 168 hours. No results for input(s): TROPIPOC in the last 168 hours.  Chemistry Recent Labs  Lab 04/27/21 0412 04/29/21 0303 05/01/21 0220  NA 132* 132* 130*  K 4.0 4.2 5.0  CL 100 100 97*  CO2 23 22 19*  GLUCOSE 120* 115* 110*  BUN 27* 28* 37*  CREATININE 1.11 1.03 1.17  CALCIUM 8.9 9.0 9.5  GFRNONAA >60 >60 >60  ANIONGAP 9 10 14     Hematology Recent Labs  Lab 04/29/21 0303 04/30/21 0214 05/01/21 0220  WBC 15.2* 15.3* 15.1*  RBC 2.86* 2.81* 2.78*  HGB 8.0* 7.9* 7.8*  HCT 24.7* 24.4* 24.2*  MCV 86.4 86.8 87.1  MCH 28.0 28.1 28.1  MCHC 32.4 32.4 32.2  RDW 17.5* 17.7* 17.4*  PLT 412* 445* 461*   BNPNo results for input(s): BNP, PROBNP in the last 168 hours.  DDimer No results for input(s): DDIMER in the last 168 hours.    Radiology  ECHOCARDIOGRAM LIMITED  Result Date: 04/30/2021    ECHOCARDIOGRAM LIMITED REPORT   Patient Name:   Scott Foley Date of Exam: 04/30/2021 Medical Rec #:  [id number]             Height:       64.0 in Accession #:    [id number]            Weight:       207.2 lb Date of Birth:  06/19/1965             BSA:          1.986 m Patient Age:    56 years              BP:           101/72 mmHg Patient Gender: M                     HR:           106 bpm. Exam Location:  Inpatient Procedure: Limited Echo, Cardiac Doppler and Color Doppler Indications:    Pericardial effusion  History:        Patient has prior history of Echocardiogram examinations, most                 recent 04/28/2021. CAD, COPD; Risk Factors:Hypertension and                 Dyslipidemia.  Sonographer:    Clayton Lefort RDCS (AE) Referring Phys: [place]  1. Respiratory variation is 34% across the MV. The RA is not well visualized. There is evidence of early RV diastolic collapse. Similar findings of tamponade physiology to 04/28/2021 echo. Chart reviewed, hemodynamics stable since last echo. Appears plan  has been close inpatient monitoring, potential pericardiocentesis if becomes clinically indicated. . Large pericardial effusion. The pericardial effusion is circumferential.  2. The inferior vena cava is dilated in size with <50% respiratory variability, suggesting right atrial pressure of 15 mmHg.  3. Limited echo to evaluate pericardial effusion FINDINGS  Right Atrium: Right atrial size was not well visualized. Pericardium: Respiratory variation is 34% across the MV. The RA is not well visualized. There is evidence of early RV diastolic collapse. Similar findings of tamponade physiology to 04/28/2021 echo. Chart reviewed, hemodynamics stable since last echo. Appears plan has been close inpatient monitoring, potential pericardiocentesis if becomes clinically indicated. A large pericardial effusion is present. The  pericardial effusion is circumferential. Venous: The inferior vena cava is  dilated in size with less than 50% respiratory variability, suggesting right atrial pressure of 15 mmHg. IVC IVC diam: 2.20 cm Carlyle Dolly MD Electronically signed by Carlyle Dolly MD Signature Date/Time: 04/30/2021/6:05:11 PM    Final     Cardiac Studies  TTE 04/30/2021  1. Respiratory variation is 34% across the MV. The RA is not well  visualized. There is evidence of early RV diastolic collapse. Similar  findings of tamponade physiology to 04/28/2021 echo. Chart reviewed,  hemodynamics stable since last echo. Appears plan   has been close inpatient monitoring, potential pericardiocentesis if  becomes clinically indicated. . Large pericardial effusion. The  pericardial effusion is circumferential.   2. The inferior vena cava is dilated in size with <50% respiratory  variability, suggesting right atrial pressure of 15 mmHg.   3. Limited echo to evaluate pericardial effusion   Patient Profile  Toshiro Hanken is a 56 y.o. male with CAD, hypertension, PE on Eliquis, stroke, iron deficiency anemia admitted on 04/13/2021 with severe acute pancreatitis with sepsis.  Cardiology was consulted on 04/28/2021 for large pericardial effusion seen on CT scan.  Pericardiocentesis was delayed due to necrotizing pancreatitis.  Evaluation on 05/01/2021 shows worsening hypotension and tachycardia concerning for pericardial tamponade.  Assessment & Plan   #Large pericardial effusion with pericardial tamponade #Hypotension #Cardiogenic shock -Currently admitted to the hospital with complications of necrotizing pancreatitis.  He developed pancreatitis post ERCP from a sending cholangitis in December 2023.  He has had a rather complicated course with recurrent pancreatitis as well as necrotizing pancreatitis requiring drainage recently. -He has had multiple aspirations of pancreatic fluid collections. -CT scan on 04/28/2021  demonstrated large circumferential pericardial effusion.  Echocardiogram shows this is worsening today.  He is hypotensive.  He is tachycardic.  He is in likely cardiogenic shock secondary to pericardial tamponade. -I have recommended urgent pericardiocentesis.  Case discussed with Dr. Peter Martinique who is interventional cardiology on-call.  He looks like he has good subcostal windows.  -He is on heparin.  We will stop this. -He did eat breakfast this morning around 8.  Had minimal fluid intake.  N.p.o. now.  Plan for pericardiocentesis in the next 1 to 2 hours. -If we are unable to drain this he will need urgent pericardial window.  Hopefully we can perform a successful pericardiocentesis. -Unfortunately given his critical illness we need to proceed today.  If we do not he will likely not survive.  Explained this to his wife and the patient in the room.  They are willing to proceed. -ordered levophed until can get case done.  -hold BP meds.   #Necrotizing pancreatitis #Sepsis -Treatment per hospital medicine -on Abx  #Anemia -appears to be chronic -stable  #History of PE -stop heparin drip for pericardiocentesis.   CRITICAL CARE Performed by: [place] Bells T O'Neal  Total critical care time: 60 minutes. Critical care time was exclusive of separately billable procedures and treating other patients. Critical care was necessary to treat or prevent imminent or life-threatening deterioration. Critical care was time spent personally by me on the following activities: development of treatment plan with patient and/or surrogate as well as nursing, discussions with consultants, evaluation of patient's response to treatment, examination of patient, obtaining history from patient or surrogate, ordering and performing treatments and interventions, ordering and review of laboratory studies, ordering and review of radiographic studies, pulse oximetry and re-evaluation of patient's condition.  For questions or  updates, please contact [place] Please consult [email or web address] for contact info under  Signed, Addison Naegeli. Audie Box, MD, [place]  05/01/2021 11:08 AM

## 2021-05-02 ENCOUNTER — Inpatient Hospital Stay (HOSPITAL_COMMUNITY): Payer: Medicare HMO

## 2021-05-02 ENCOUNTER — Encounter (HOSPITAL_COMMUNITY): Payer: Self-pay | Admitting: Cardiology

## 2021-05-02 DIAGNOSIS — L0291 Cutaneous abscess, unspecified: Secondary | ICD-10-CM

## 2021-05-02 DIAGNOSIS — I3139 Other pericardial effusion (noninflammatory): Secondary | ICD-10-CM

## 2021-05-02 DIAGNOSIS — Z86711 Personal history of pulmonary embolism: Secondary | ICD-10-CM | POA: Diagnosis not present

## 2021-05-02 DIAGNOSIS — D649 Anemia, unspecified: Secondary | ICD-10-CM | POA: Diagnosis not present

## 2021-05-02 DIAGNOSIS — I314 Cardiac tamponade: Secondary | ICD-10-CM | POA: Diagnosis not present

## 2021-05-02 DIAGNOSIS — I10 Essential (primary) hypertension: Secondary | ICD-10-CM | POA: Diagnosis not present

## 2021-05-02 DIAGNOSIS — K859 Acute pancreatitis without necrosis or infection, unspecified: Secondary | ICD-10-CM | POA: Diagnosis not present

## 2021-05-02 DIAGNOSIS — K8591 Acute pancreatitis with uninfected necrosis, unspecified: Secondary | ICD-10-CM | POA: Diagnosis not present

## 2021-05-02 DIAGNOSIS — I959 Hypotension, unspecified: Secondary | ICD-10-CM

## 2021-05-02 LAB — BASIC METABOLIC PANEL
Anion gap: 11 (ref 5–15)
BUN: 41 mg/dL — ABNORMAL HIGH (ref 6–20)
CO2: 21 mmol/L — ABNORMAL LOW (ref 22–32)
Calcium: 8.6 mg/dL — ABNORMAL LOW (ref 8.9–10.3)
Chloride: 99 mmol/L (ref 98–111)
Creatinine, Ser: 1.11 mg/dL (ref 0.61–1.24)
GFR, Estimated: >60 mL/min (ref >60)
Glucose, Bld: 90 mg/dL (ref 70–99)
Potassium: 3.9 mmol/L (ref 3.5–5.1)
Sodium: 131 mmol/L — ABNORMAL LOW (ref 135–145)

## 2021-05-02 LAB — CBC
HCT: 22.3 % — ABNORMAL LOW (ref 39.0–52.0)
Hemoglobin: 7.1 g/dL — ABNORMAL LOW (ref 13.0–17.0)
MCH: 27.4 pg (ref 26.0–34.0)
MCHC: 31.8 g/dL (ref 30.0–36.0)
MCV: 86.1 fL (ref 80.0–100.0)
Platelets: 383 10*3/uL (ref 150–400)
RBC: 2.59 MIL/uL — ABNORMAL LOW (ref 4.22–5.81)
RDW: 17.4 % — ABNORMAL HIGH (ref 11.5–15.5)
WBC: 13 10*3/uL — ABNORMAL HIGH (ref 4.0–10.5)
nRBC: 0 % (ref 0.0–0.2)

## 2021-05-02 LAB — C-REACTIVE PROTEIN: CRP: 17.6 mg/dL — ABNORMAL HIGH (ref <1.0)

## 2021-05-02 LAB — ECHOCARDIOGRAM LIMITED
AV Mean grad: 5 mmHg
AV Peak grad: 9.1 mmHg
Ao pk vel: 1.51 m/s
Height: 64 in
Weight: 3089.97 oz

## 2021-05-02 LAB — PREPARE RBC (CROSSMATCH)

## 2021-05-02 LAB — GLUCOSE, CAPILLARY
Glucose-Capillary: 79 mg/dL (ref 70–99)
Glucose-Capillary: 76 mg/dL (ref 70–99)
Glucose-Capillary: 95 mg/dL (ref 70–99)

## 2021-05-02 LAB — HEMOGLOBIN AND HEMATOCRIT, BLOOD
HCT: 28.7 % — ABNORMAL LOW (ref 39.0–52.0)
Hemoglobin: 10 g/dL — ABNORMAL LOW (ref 13.0–17.0)

## 2021-05-02 MED ORDER — FUROSEMIDE 10 MG/ML IJ SOLN
20.0000 mg | Freq: Three times a day (TID) | INTRAMUSCULAR | Status: AC
  Administered 2021-05-02 (×2): 20 mg via INTRAVENOUS
  Filled 2021-05-02 (×2): qty 2

## 2021-05-02 MED ORDER — SODIUM CHLORIDE 0.9% IV SOLUTION: Freq: Once | INTRAVENOUS | Status: AC

## 2021-05-02 NOTE — Progress Notes (Signed)
Cardiology Progress Note  Patient ID: Scott Foley MRN: [id number] DOB: 1965-12-27 Date of Encounter: 05/02/2021  Primary Cardiologist: None  Subjective   Chief Complaint: Weakness  HPI: clinical status improved s/p pericardiocentesis. He is s/p pericardial drain. He states he is sore, no pleuritic chest pain. Off pressors  ROS:  All other ROS reviewed and negative. Pertinent positives noted in the HPI.     Inpatient Medications  Scheduled Meds:  (feeding supplement) PROSource Plus  30 mL Oral BID BM   Chlorhexidine Gluconate Cloth  6 each Topical Daily   feeding supplement  237 mL Oral BID BM   fluticasone furoate-vilanterol  1 puff Inhalation Daily   And   umeclidinium bromide  1 puff Inhalation Daily   multivitamin with minerals  1 tablet Oral Daily   mupirocin ointment  1 application Nasal BID   pantoprazole  40 mg Oral BID   sodium chloride flush  5 mL Intracatheter Q8H   vitamin B-12  1,000 mcg Per Tube Daily   Continuous Infusions:  sodium chloride     meropenem (MERREM) IV 1 g (05/01/21 2356)   norepinephrine (LEVOPHED) Adult infusion     PRN Meds: acetaminophen, albuterol, morphine injection, ondansetron **OR** ondansetron (ZOFRAN) IV, oxyCODONE   Vital Signs   Vitals:   05/02/21 0200 05/02/21 0300 05/02/21 0400 05/02/21 0500  BP: 108/71 104/72 115/76   Pulse: 99 100 97   Resp: 18 19 18    Temp:      TempSrc:      SpO2: 93% 94% 94%   Weight:    87.6 kg  Height:        Intake/Output Summary (Last 24 hours) at 05/02/2021 0816 Last data filed at 05/02/2021 0400 Gross per 24 hour  Intake 445 ml  Output 1545 ml  Net -1100 ml   Last 3 Weights 05/02/2021 04/25/2021 04/24/2021  Weight (lbs) 193 lb 2 oz 207 lb 3.7 oz 207 lb 3.7 oz  Weight (kg) 87.6 kg 94 kg 94 kg      Telemetry  Overnight telemetry shows sinus tachycardia 100s, which I personally reviewed.   Physical Exam   Vitals:   05/02/21 0200 05/02/21 0300 05/02/21 0400 05/02/21 0500  BP:  108/71 104/72 115/76   Pulse: 99 100 97   Resp: 18 19 18    Temp:      TempSrc:      SpO2: 93% 94% 94%   Weight:    87.6 kg  Height:        Intake/Output Summary (Last 24 hours) at 05/02/2021 0817 Last data filed at 05/02/2021 0400 Gross per 24 hour  Intake 445 ml  Output 1545 ml  Net -1100 ml    Last 3 Weights 05/02/2021 04/25/2021 04/24/2021  Weight (lbs) 193 lb 2 oz 207 lb 3.7 oz 207 lb 3.7 oz  Weight (kg) 87.6 kg 94 kg 94 kg    Body mass index is 33.15 kg/m.   Physical Exam Gen: cachectic, on acute distress. Sitting in a chair Neuro: alert and oriented CV: tachycardic no murmurs. No JVD Pulm: nl wob, decreased lung sounds in the bases Abd: non distended Ext: No LE edema Skin: warm and well perfused Psych: normal mood  Pericardial Drain- serosanguinous fluid   Labs  High Sensitivity Troponin:  No results for input(s): TROPONINIHS in the last 720 hours.   Cardiac EnzymesNo results for input(s): TROPONINI in the last 168 hours. No results for input(s): TROPIPOC in the last 168 hours.  Chemistry Recent  Labs  Lab 04/29/21 0303 05/01/21 0220 05/02/21 0120  NA 132* 130* 131*  K 4.2 5.0 3.9  CL 100 97* 99  CO2 22 19* 21*  GLUCOSE 115* 110* 90  BUN 28* 37* 41*  CREATININE 1.03 1.17 1.11  CALCIUM 9.0 9.5 8.6*  GFRNONAA >60 >60 >60  ANIONGAP 10 14 11     Hematology Recent Labs  Lab 04/30/21 0214 05/01/21 0220 05/02/21 0120  WBC 15.3* 15.1* 13.0*  RBC 2.81* 2.78* 2.59*  HGB 7.9* 7.8* 7.1*  HCT 24.4* 24.2* 22.3*  MCV 86.8 87.1 86.1  MCH 28.1 28.1 27.4  MCHC 32.4 32.2 31.8  RDW 17.7* 17.4* 17.4*  PLT 445* 461* 383   BNPNo results for input(s): BNP, PROBNP in the last 168 hours.  DDimer No results for input(s): DDIMER in the last 168 hours.   Radiology  CARDIAC CATHETERIZATION  Result Date: 05/01/2021 Successful pericardiocentesis from an intercostal approach with removal of 600 cc of hemorrhagic fluid. Plan: will leave drain in place for now. Check portable  CXR. Fluid sent for analysis. Will hold IV heparin.   DG CHEST PORT 1 VIEW  Result Date: 05/01/2021 CLINICAL DATA:  Pericardial effusion EXAM: PORTABLE CHEST 1 VIEW COMPARISON:  05/01/2021, 9:51 a.m. FINDINGS: Unchanged cardiomegaly. Diffuse bilateral interstitial pulmonary opacity. Small, layering bilateral pleural effusions. The visualized skeletal structures are unremarkable. IMPRESSION: 1. Unchanged AP portable examination. Cardiomegaly in keeping with known pericardial effusion. 2. Diffuse bilateral interstitial pulmonary opacity and small, layering bilateral pleural effusions, consistent with edema. No new airspace opacity. Electronically Signed   By: Delanna Ahmadi M.D.   On: 05/01/2021 17:00   DG CHEST PORT 1 VIEW  Result Date: 05/01/2021 CLINICAL DATA:  Dyspnea R06.00 (ICD-10-CM) EXAM: PORTABLE CHEST 1 VIEW COMPARISON:  February 1, 23. FINDINGS: Low lung volumes. New retrocardiac opacity.Otherwise, similar patchy mild opacities in bilateral mid basilar lungs. Question nodular opacity in the right midlung. No definite pleural effusions or pneumothorax. Cardiomediastinal silhouette is mildly enlarged, likely accentuated by low lung volumes and AP technique. IMPRESSION: 1. Low lung volumes with new retrocardiac opacity, which could represent atelectasis, aspiration, and/or pneumonia. Otherwise, similar patchy mild opacities in bilateral mid basilar lungs, suspicious for pneumonia. 2. Question nodular opacity in the right midlung. Recommend CT chest to further evaluate for pulmonary nodule/malignancy. Electronically Signed   By: Margaretha Sheffield M.D.   On: 05/01/2021 11:12   ECHOCARDIOGRAM LIMITED  Result Date: 05/01/2021    ECHOCARDIOGRAM LIMITED REPORT   Patient Name:   Scott Foley Date of Exam: 05/01/2021 Medical Rec #:  [id number]             Height:       64.0 in Accession #:    [id number]            Weight:       207.2 lb Date of Birth:  07/02/1965             BSA:          1.986 m  Patient Age:    56 years              BP:           116/75 mmHg Patient Gender: M                     HR:           110 bpm. Exam Location:  Inpatient Procedure: Limited Echo Indications:    Pericardial effusion and  pericardiocentesis.  History:        Patient has prior history of Echocardiogram examinations, most                 recent 04/30/2021. CAD, COPD; Risk Factors:Hypertension and                 Dyslipidemia.  Sonographer:    Clayton Lefort RDCS (AE) Referring Phys: [id number] PETER M Martinique IMPRESSIONS  1. Left ventricular ejection fraction, by estimation, is 55 to 60%. The left ventricle has normal function. The left ventricle demonstrates regional wall motion abnormalities abnormal septal motion.  2. Right ventricular systolic function is normal. The right ventricular size is normal.  3. Moderate pericardial effusion. The pericardial effusion is lateral to the left ventricle.  4. The mitral valve is normal in structure. No evidence of mitral valve regurgitation.  5. Compared to prior effusion size has improved. While there is no RV or RA collapse, there is a persistent pericardial effusion, localized lateral to the LV. Patient has tachycardia. The IVC was not assessed. Comparison(s): Persistent pericardial effusion. FINDINGS  Left Ventricle: Left ventricular ejection fraction, by estimation, is 55 to 60%. The left ventricle has normal function. The left ventricle demonstrates regional wall motion abnormalities. Right Ventricle: The right ventricular size is normal. Right ventricular systolic function is normal. Pericardium: A moderately sized pericardial effusion is present. The pericardial effusion is lateral to the left ventricle. Mitral Valve: The mitral valve is normal in structure. Tricuspid Valve: The tricuspid valve is normal in structure. Rudean Haskell MD Electronically signed by Rudean Haskell MD Signature Date/Time: 05/01/2021/1:51:12 PM    Final    ECHOCARDIOGRAM LIMITED  Result Date:  04/30/2021    ECHOCARDIOGRAM LIMITED REPORT   Patient Name:   PERRY BRUCATO Date of Exam: 04/30/2021 Medical Rec #:  [id number]             Height:       64.0 in Accession #:    [id number]            Weight:       207.2 lb Date of Birth:  June 15, 1965             BSA:          1.986 m Patient Age:    56 years              BP:           101/72 mmHg Patient Gender: M                     HR:           106 bpm. Exam Location:  Inpatient Procedure: Limited Echo, Cardiac Doppler and Color Doppler Indications:    Pericardial effusion  History:        Patient has prior history of Echocardiogram examinations, most                 recent 04/28/2021. CAD, COPD; Risk Factors:Hypertension and                 Dyslipidemia.  Sonographer:    Clayton Lefort RDCS (AE) Referring Phys: [place]  1. Respiratory variation is 34% across the MV. The RA is not well visualized. There is evidence of early RV diastolic collapse. Similar findings of tamponade physiology to 04/28/2021 echo. Chart reviewed, hemodynamics stable since last echo. Appears plan  has been close inpatient monitoring, potential pericardiocentesis if becomes clinically  indicated. . Large pericardial effusion. The pericardial effusion is circumferential.  2. The inferior vena cava is dilated in size with <50% respiratory variability, suggesting right atrial pressure of 15 mmHg.  3. Limited echo to evaluate pericardial effusion FINDINGS  Right Atrium: Right atrial size was not well visualized. Pericardium: Respiratory variation is 34% across the MV. The RA is not well visualized. There is evidence of early RV diastolic collapse. Similar findings of tamponade physiology to 04/28/2021 echo. Chart reviewed, hemodynamics stable since last echo. Appears plan has been close inpatient monitoring, potential pericardiocentesis if becomes clinically indicated. A large pericardial effusion is present. The pericardial effusion is circumferential. Venous: The  inferior vena cava is dilated in size with less than 50% respiratory variability, suggesting right atrial pressure of 15 mmHg. IVC IVC diam: 2.20 cm Carlyle Dolly MD Electronically signed by Carlyle Dolly MD Signature Date/Time: 04/30/2021/6:05:11 PM    Final     Cardiac Studies  TTE 04/30/2021  1. Respiratory variation is 34% across the MV. The RA is not well  visualized. There is evidence of early RV diastolic collapse. Similar  findings of tamponade physiology to 04/28/2021 echo. Chart reviewed,  hemodynamics stable since last echo. Appears plan   has been close inpatient monitoring, potential pericardiocentesis if  becomes clinically indicated. . Large pericardial effusion. The  pericardial effusion is circumferential.   2. The inferior vena cava is dilated in size with <50% respiratory  variability, suggesting right atrial pressure of 15 mmHg.   3. Limited echo to evaluate pericardial effusion   Patient Profile  Mieczyslaw Stamas is a 56 y.o. male with CAD, hypertension, PE on Eliquis, stroke, iron deficiency anemia admitted on 04/13/2021 with severe acute pancreatitis with sepsis.  Cardiology was consulted on 04/28/2021 for large pericardial effusion seen on CT scan.  Pericardiocentesis was delayed due to necrotizing pancreatitis.  Evaluation on 05/01/2021 shows worsening hypotension and tachycardia concerning for pericardial tamponade.  Assessment & Plan   #Large pericardial effusion with pericardial tamponade #Hypotension #Cardiogenic shock - resolved, s/p pericardiocentesis and drain - off pressors  -if he develops pleuritic chest pain will consider tx for pericarditis - still has moderate pericardial effusion - output ~ 95 ccc - will continue the drain until ~ 50 cc of output in 24 hours, then will repeat limited echo.  -Currently admitted to the hospital with complications of necrotizing pancreatitis.  He developed pancreatitis post ERCP from a sending cholangitis in December  2023.  He has had a rather complicated course with recurrent pancreatitis as well as necrotizing pancreatitis requiring drainage recently. -He has had multiple aspirations of pancreatic fluid collections. -CT scan on 04/28/2021 demonstrated large circumferential pericardial effusion.   -hypotension also in the setting of sepsis  Sinus tachycardia: ddx includes dehydration/sepsis - fluids as needed - on meropenem   #Necrotizing pancreatitis #Sepsis -Treatment per hospital medicine -on Abx  #Anemia -appears to be chronic -stable  #History of PE -stop heparin drip for pericardiocentesis.  - continue to hold [facility] until drain removed   For questions or updates, please contact [place] Please consult [email or web address] for contact info under     Janina Mayo, MD

## 2021-05-02 NOTE — Progress Notes (Signed)
[facility] PROGRESS NOTE   Thinh Cuccaro KPV:[id number] DOB: 24-Dec-1965 DOA: 04/13/2021  19 DOS: the patient was seen and examined on 05/02/2021  PCP: [name], MD  Brief History and Hospital Course:  56 y.o. male with medical history significant of hx of CAD, HTN, hx of PE on eliquis, HLD, hxo of CVA in 2014, IDA with history of recent hospitalizaion on 03/16/21 for recurrent pancreatitis, sepsis due to bacteremia from klebsiella pneumoniae, covid-19 and respiratory failure requiring home oxygen on discharge.  Presented with a 4-day history of diffuse worsening abdominal pain.  He has had abdominal issues for the past 2 months.  He had a cholecystectomy on 12/21 and underwent ERCP on 12/22 since the intraoperative cholangiogram showed a CBD stone.  After ERCP he developed pancreatitis.  He was managed conservatively and then discharged home on 12/28.  He was readmitted to [facility] on 1/4 for recurrent pancreatitis and COVID-19.  Noted to have sepsis respiratory failure.  Blood cultures were positive for Klebsiella pneumonia.  He was treated with Invanz for 10 days.  Discharged on 2 L of oxygen. He had a postop visit with surgeon on 04/12/21 when he complained of abdominal pain.  A CT scan was subsequently done which showed pancreatic necrosis with gas within the pancreatic fluid collections.  He was sent over to [facility] emergency department.  Interventional radiology was consulted for aspiration of the fluid collection which was performed on 2/6.   CT scan of the abdomen pelvis was repeated on 2/16.  Showed stable findings for the most part. Incidentally noted to have significant pericardial effusion with features concerning for tamponade.  Cardiology was consulted.  Patient underwent pericardiocentesis on 2/19.  Consultants: Gastroenterology.  Interventional radiology.  General surgery.  Infectious disease  Procedures:  Drainage of fluid collection in the abdomen by  interventional radiology on 2/6  Transthoracic echocardiogram 2/17  Pericardiocentesis 2/19.   Subjective: Patient states that he is feeling well.  Denies any chest pain.  Little bit more short of breath than usual.  Denies any nausea vomiting.  Fatigue has improved.     Assessment/Plan:   * Necrotizing pancreatitis- (present on admission) This is either a third episode of pancreatitis or sequelae of pancreatitis from his episode in January.  His lipase level was noted to be normal. CT scan was done at [facility] which apparently showed progressive severe pancreatitis with extensive fluid collection surrounding the pancreas and extending into the right retroperitoneum.  Many of these fluid collections contained gas concerning for infection.  Compression of splenic vein and SMV was noted although they appear to be patent.  Patient was started on meropenem.   Patient was seen by gastroenterology and general surgery.  Both of these services have signed off.   Interventional radiology consulted for percutaneous drainage which was done on 2/6 Patient was started on cortrak tube feedings.  Diet was slowly advanced.  Cortrak feeding tube was removed subsequently. CT scan of the abdomen pelvis was repeated on 2/16.   Evolving changes of necrotizing pancreatitis noted.  Mostly stable findings.  Fluid collection which was drained was noted to be decreased in size.  Wide patency of the superior mesenteric, splenic and portal veins were noted. ID recommends continuing meropenem. Drain to remain in place for now. Pain is reasonably well controlled.  Pericardial effusion CT scan incidentally showed clinically significant pericardial effusion.  There was some concern for tamponade although patient was noted to be hemodynamically stable.  Repeat echocardiograms  continue to show tamponade features.   Etiology is thought to be secondary to inflammation from the pancreas.   Patient blood pressure started  dropping.  He underwent urgent pericardiocentesis on 2/19.  Bloody fluid was noted.  His anticoagulation has been discontinued. He was also noted to be tachycardic. After the procedure his blood pressure and heart rate have improved.  Cardiology continues to follow. Pericardial fluid Gram stain showed gram-positive cocci.  Discussed with ID.  We will wait for final identification.  No change to antibiotic regimen at this time.  Normocytic anemia- (present on admission) Patient did have a significant drop in hemoglobin most of which was thought to be secondary to dilution.  Could also have blood into the pericardial space.   Anemia panel reviewed.  Vitamin B12 noted to be low normal at 254.  Being supplemented.  Folate 8.2.  Ferritin 1033.  TIBC 137.  Iron 16. He was transfused 1 unit of PRBC during the early part of this admission.  Hemoglobin slowly drifting down.  He has fatigue.  Will transfuse additional unit of PRBC today.  Recheck labs tomorrow.  Sepsis ([place])- (present on admission) On admission he met sepsis criteria with tachycardia, fever to 100.8, WBC to 18.2.  Recently noted to have Klebsiella bacteremia earlier in January when he was hospitalized in [place].  Blood cultures done here in the hospital have been negative so far. Sepsis physiology improved.   Remains afebrile.  WBC slowly improving. Cultures from intra-abdominal fluid collection sent after aspiration 2/6- kleb/citrobacter Infectious disease continues to follow.  Remains on meropenem.  Chronic respiratory failure with hypoxia ([place])- (present on admission) Recently recovered from COVID-19 infection.  Was discharged on 2 L of oxygen by nasal cannula.  Admission chest x-ray showed opacities which could be from his recent infection rather than any infection.   Noted to be a little bit more dyspneic yesterday.  X-ray was done which showed diffuse bilateral opacities seen previously as well. Might benefit from furosemide after  blood transfusion.  We will run this by cardiology.  Chronic obstructive pulmonary disease ([place])- (present on admission) No signs of exacerbation. Continue trelegy daily and albuterol prn.  History of pulmonary embolism- (present on admission) He was on Eliquis prior to admission.  He was placed on hold due to his abdominal issues and possible need for procedures.  He was transitioned to IV heparin.  Developed pericardial effusion.  Pericardiocentesis was done which showed that the fluid was bloody.  Anticoagulation has been discontinued for now.  Will likely need to hold going forward.   His episode of PE and DVT was in December of 2020.   Essential hypertension- (present on admission) Sinus tachycardia  ACE inhibitor on hold due to borderline low blood pressures. Remains on beta-blocker.  Heart rate remains elevated but better.  Blood pressure remains borderline low.  Hyperlipemia- (present on admission) Statin on hold currently.  Triglyceride was 121 in December.  Coronary artery disease with stable angina pectoris ([place])- (present on admission) Stable.  Plavix currently on hold.   Protein-calorie malnutrition, severe Feeding tube has been removed.  He is doing very well with oral intake.  Nutrition Status: Nutrition Problem: Severe Malnutrition Etiology: acute illness (pancreatitis) Signs/Symptoms: moderate muscle depletion, moderate fat depletion, energy intake < or equal to 50% for > or equal to 5 days, percent weight loss (11% weight loss within 3 months) Percent weight loss: 11 % Interventions: Tube feeding    Hypokalemia- (present on admission) Hypomagnesemia  Repleted.  Check periodically.  AKI (acute kidney injury) ([place])- (present on admission) Presented with creatinine of 2.49. This was likely prerenal in the setting of sepsis along with nausea and vomiting over the past few days.   Patient aggressively hydrated.  Renal function improved.  Patient told to stay  well-hydrated  Hypoglycemia Resolved with initiation of fluids and nutrition.   Obesity Estimated body mass index is 33.15 kg/m as calculated from the following:   Height as of this encounter: '5\' 4"'  (1.626 m).   Weight as of this encounter: 87.6 kg.    DVT Prophylaxis: IV heparin discontinued due to bloody pericardial effusion.  SCDs. Code Status: Full code Family Communication: Discussed with patient.  Wife being updated every few days. Disposition Plan: Hopefully return home when improved  Status is: Inpatient  Remains inpatient appropriate because: Acute pancreatitis, infected intra-abdominal fluid collection       Medications: Scheduled:  (feeding supplement) PROSource Plus  30 mL Oral BID BM   sodium chloride   Intravenous Once   Chlorhexidine Gluconate Cloth  6 each Topical Daily   feeding supplement  237 mL Oral BID BM   fluticasone furoate-vilanterol  1 puff Inhalation Daily   And   umeclidinium bromide  1 puff Inhalation Daily   furosemide  20 mg Intravenous Q8H   multivitamin with minerals  1 tablet Oral Daily   mupirocin ointment  1 application Nasal BID   pantoprazole  40 mg Oral BID   sodium chloride flush  5 mL Intracatheter Q8H   vitamin B-12  1,000 mcg Per Tube Daily   Continuous:  sodium chloride     meropenem (MERREM) IV 1 g (05/02/21 0817)   norepinephrine (LEVOPHED) Adult infusion     QJJ:HERDEYCXKGYJE, albuterol, morphine injection, ondansetron **OR** ondansetron (ZOFRAN) IV, oxyCODONE  Antibiotics: Anti-infectives (From admission, onward)    Start     Dose/Rate Route Frequency Ordered Stop   04/16/21 1600  meropenem (MERREM) 1 g in sodium chloride 0.9 % 100 mL IVPB        1 g 200 mL/hr over 30 Minutes Intravenous Every 8 hours 04/16/21 1234     04/13/21 2000  meropenem (MERREM) 1 g in sodium chloride 0.9 % 100 mL IVPB  Status:  Discontinued        1 g 200 mL/hr over 30 Minutes Intravenous Every 12 hours 04/13/21 1934 04/16/21 1234    04/13/21 1800  ceFEPIme (MAXIPIME) 2 g in sodium chloride 0.9 % 100 mL IVPB  Status:  Discontinued        2 g 200 mL/hr over 30 Minutes Intravenous Every 12 hours 04/13/21 1731 04/13/21 1852   04/13/21 1715  levofloxacin (LEVAQUIN) IVPB 750 mg  Status:  Discontinued        750 mg 100 mL/hr over 90 Minutes Intravenous  Once 04/13/21 1713 04/13/21 1728   04/13/21 1715  metroNIDAZOLE (FLAGYL) IVPB 500 mg  Status:  Discontinued        500 mg 100 mL/hr over 60 Minutes Intravenous  Once 04/13/21 1713 04/13/21 1852       Objective:  Vital Signs  Vitals:   05/02/21 0800 05/02/21 0900 05/02/21 0920 05/02/21 1000  BP:  126/79  124/81  Pulse: (!) 112 (!) 106 (!) 109 (!) 101  Resp: (!) 31 (!) 29 (!) 24 (!) 27  Temp:      TempSrc:      SpO2: 91% 93% 92% 93%  Weight:      Height:  Intake/Output Summary (Last 24 hours) at 05/02/2021 1118 Last data filed at 05/02/2021 1000 Gross per 24 hour  Intake 1025 ml  Output 1545 ml  Net -520 ml    Filed Weights   04/24/21 0500 04/25/21 0500 05/02/21 0500  Weight: 94 kg 94 kg 87.6 kg    General appearance: Awake alert.  In no distress Resp: Noted to be mildly tachypneic.  No wheezing.  Crackles at the bases.  No rhonchi. Cardio: S1-S2 is tachycardic regular.  No S3-S4. GI: Abdomen is soft.  Nontender nondistended.  Bowel sounds are present normal.  No masses organomegaly Extremities: No edema. Neurologic: Alert and oriented x3.  No focal neurological deficits.     Lab Results:  Data Reviewed: I have personally reviewed labs and imaging study reports  CBC: Recent Labs  Lab 04/27/21 0412 04/29/21 0303 04/30/21 0214 05/01/21 0220 05/02/21 0120  WBC 16.5* 15.2* 15.3* 15.1* 13.0*  HGB 8.3* 8.0* 7.9* 7.8* 7.1*  HCT 24.9* 24.7* 24.4* 24.2* 22.3*  MCV 85.6 86.4 86.8 87.1 86.1  PLT 406* 412* 445* 461* 383     Basic Metabolic Panel: Recent Labs  Lab 04/26/21 0450 04/27/21 0412 04/29/21 0303 05/01/21 0220 05/02/21 0120   NA 133* 132* 132* 130* 131*  K 4.1 4.0 4.2 5.0 3.9  CL 100 100 100 97* 99  CO2 21* 23 22 19* 21*  GLUCOSE 104* 120* 115* 110* 90  BUN 24* 27* 28* 37* 41*  CREATININE 1.02 1.11 1.03 1.17 1.11  CALCIUM 8.9 8.9 9.0 9.5 8.6*  MG  --   --   --  2.4  --      GFR: Estimated Creatinine Clearance: 74.2 mL/min (by C-G formula based on SCr of 1.11 mg/dL).   CBG: Recent Labs  Lab 04/30/21 2044 05/01/21 0811 05/01/21 1603 05/02/21 0729 05/02/21 1109  GLUCAP 100* 107* 90 79 76      Recent Results (from the past 240 hour(s))  Surgical PCR screen     Status: None   Collection Time: 05/01/21 11:34 AM   Specimen: Nasal Mucosa; Nasal Swab  Result Value Ref Range Status   MRSA, PCR NEGATIVE NEGATIVE Final   Staphylococcus aureus NEGATIVE NEGATIVE Final    Comment: (NOTE) The Xpert SA Assay (FDA approved for NASAL specimens in patients 38 years of age and older), is one component of a comprehensive surveillance program. It is not intended to diagnose infection nor to guide or monitor treatment. Performed at [facility], [place] [address], [place], [place] [address]   Culture, body fluid w Gram Stain-bottle     Status: None (Preliminary result)   Collection Time: 05/01/21 12:42 PM   Specimen: Pericardial  Result Value Ref Range Status   Specimen Description PERICARDIAL FLUID  Final   Special Requests SPEC A  Final   Gram Stain   Final    GRAM POSITIVE COCCI IN BOTH AEROBIC AND ANAEROBIC BOTTLES CRITICAL RESULT CALLED TO, READ BACK BY AND VERIFIED WITH: T ARMSTRONG,RN '@0300'  05/02/21 [place]    Culture   Final    NO GROWTH < 24 HOURS Performed at [facility], [place] [address], [place], [place] [address]    Report Status PENDING  Incomplete  Gram stain     Status: None   Collection Time: 05/01/21 12:42 PM   Specimen: Pericardial  Result Value Ref Range Status   Specimen Description PERICARDIAL FLUID  Final   Special Requests SPEC A  Final   Gram Stain  Final    FEW  WBC PRESENT,BOTH PMN AND MONONUCLEAR NO ORGANISMS SEEN Performed at [facility], [place] [address], [place], [place] [address]    Report Status 05/01/2021 FINAL  Final       Radiology Studies: CARDIAC CATHETERIZATION  Result Date: 05/01/2021 Successful pericardiocentesis from an intercostal approach with removal of 600 cc of hemorrhagic fluid. Plan: will leave drain in place for now. Check portable CXR. Fluid sent for analysis. Will hold IV heparin.   DG CHEST PORT 1 VIEW  Result Date: 05/01/2021 CLINICAL DATA:  Pericardial effusion EXAM: PORTABLE CHEST 1 VIEW COMPARISON:  05/01/2021, 9:51 a.m. FINDINGS: Unchanged cardiomegaly. Diffuse bilateral interstitial pulmonary opacity. Small, layering bilateral pleural effusions. The visualized skeletal structures are unremarkable. IMPRESSION: 1. Unchanged AP portable examination. Cardiomegaly in keeping with known pericardial effusion. 2. Diffuse bilateral interstitial pulmonary opacity and small, layering bilateral pleural effusions, consistent with edema. No new airspace opacity. Electronically Signed   By: Delanna Ahmadi M.D.   On: 05/01/2021 17:00   DG CHEST PORT 1 VIEW  Result Date: 05/01/2021 CLINICAL DATA:  Dyspnea R06.00 (ICD-10-CM) EXAM: PORTABLE CHEST 1 VIEW COMPARISON:  February 1, 23. FINDINGS: Low lung volumes. New retrocardiac opacity.Otherwise, similar patchy mild opacities in bilateral mid basilar lungs. Question nodular opacity in the right midlung. No definite pleural effusions or pneumothorax. Cardiomediastinal silhouette is mildly enlarged, likely accentuated by low lung volumes and AP technique. IMPRESSION: 1. Low lung volumes with new retrocardiac opacity, which could represent atelectasis, aspiration, and/or pneumonia. Otherwise, similar patchy mild opacities in bilateral mid basilar lungs, suspicious for pneumonia. 2. Question nodular opacity in the right midlung. Recommend CT chest to further evaluate for pulmonary  nodule/malignancy. Electronically Signed   By: Margaretha Sheffield M.D.   On: 05/01/2021 11:12   ECHOCARDIOGRAM LIMITED  Result Date: 05/01/2021    ECHOCARDIOGRAM LIMITED REPORT   Patient Name:   Scott Foley Date of Exam: 05/01/2021 Medical Rec #:  [id number]             Height:       64.0 in Accession #:    [id number]            Weight:       207.2 lb Date of Birth:  16-Jan-1966             BSA:          1.986 m Patient Age:    9 years              BP:           116/75 mmHg Patient Gender: M                     HR:           110 bpm. Exam Location:  Inpatient Procedure: Limited Echo Indications:    Pericardial effusion and pericardiocentesis.  History:        Patient has prior history of Echocardiogram examinations, most                 recent 04/30/2021. CAD, COPD; Risk Factors:Hypertension and                 Dyslipidemia.  Sonographer:    Clayton Lefort RDCS (AE) Referring Phys: [id number] PETER M [place] IMPRESSIONS  1. Left ventricular ejection fraction, by estimation, is 55 to 60%. The left ventricle has normal function. The left ventricle demonstrates regional wall motion abnormalities abnormal septal motion.  2. Right ventricular systolic function is normal. The right ventricular size is normal.  3. Moderate pericardial effusion. The pericardial effusion is lateral to the left ventricle.  4. The mitral valve is normal in structure. No evidence of mitral valve regurgitation.  5. Compared to prior effusion size has improved. While there is no RV or RA collapse, there is a persistent pericardial effusion, localized lateral to the LV. Patient has tachycardia. The IVC was not assessed. Comparison(s): Persistent pericardial effusion. FINDINGS  Left Ventricle: Left ventricular ejection fraction, by estimation, is 55 to 60%. The left ventricle has normal function. The left ventricle demonstrates regional wall motion abnormalities. Right Ventricle: The right ventricular size is normal. Right ventricular systolic  function is normal. Pericardium: A moderately sized pericardial effusion is present. The pericardial effusion is lateral to the left ventricle. Mitral Valve: The mitral valve is normal in structure. Tricuspid Valve: The tricuspid valve is normal in structure. Rudean Haskell MD Electronically signed by Rudean Haskell MD Signature Date/Time: 05/01/2021/1:51:12 PM    Final    ECHOCARDIOGRAM LIMITED  Result Date: 04/30/2021    ECHOCARDIOGRAM LIMITED REPORT   Patient Name:   ELION HOCKER Date of Exam: 04/30/2021 Medical Rec #:  [id number]             Height:       64.0 in Accession #:    [id number]            Weight:       207.2 lb Date of Birth:  11-Feb-1966             BSA:          1.986 m Patient Age:    81 years              BP:           101/72 mmHg Patient Gender: M                     HR:           106 bpm. Exam Location:  Inpatient Procedure: Limited Echo, Cardiac Doppler and Color Doppler Indications:    Pericardial effusion  History:        Patient has prior history of Echocardiogram examinations, most                 recent 04/28/2021. CAD, COPD; Risk Factors:Hypertension and                 Dyslipidemia.  Sonographer:    Clayton Lefort RDCS (AE) Referring Phys: [place]  1. Respiratory variation is 34% across the MV. The RA is not well visualized. There is evidence of early RV diastolic collapse. Similar findings of tamponade physiology to 04/28/2021 echo. Chart reviewed, hemodynamics stable since last echo. Appears plan  has been close inpatient monitoring, potential pericardiocentesis if becomes clinically indicated. . Large pericardial effusion. The pericardial effusion is circumferential.  2. The inferior vena cava is dilated in size with <50% respiratory variability, suggesting right atrial pressure of 15 mmHg.  3. Limited echo to evaluate pericardial effusion FINDINGS  Right Atrium: Right atrial size was not well visualized. Pericardium: Respiratory variation is  34% across the MV. The RA is not well visualized. There is evidence of early RV diastolic collapse. Similar findings of tamponade physiology to 04/28/2021 echo. Chart reviewed, hemodynamics stable since last echo. Appears plan has been close inpatient monitoring, potential pericardiocentesis if becomes clinically indicated. A large pericardial  effusion is present. The pericardial effusion is circumferential. Venous: The inferior vena cava is dilated in size with less than 50% respiratory variability, suggesting right atrial pressure of 15 mmHg. IVC IVC diam: 2.20 cm Carlyle Dolly MD Electronically signed by Carlyle Dolly MD Signature Date/Time: 04/30/2021/6:05:11 PM    Final        LOS: 32 days   Bonnielee Haff  [facility] Pager on [email or web address]  05/02/2021, 11:18 AM

## 2021-05-02 NOTE — Progress Notes (Signed)
PT Cancellation Note  Patient Details Name: Scott Foley MRN: [id number] DOB: Apr 01, 1965   Cancelled Treatment:    Reason Eval/Treat Not Completed: Patient declined, no reason specified Pt reports feeling tired from sitting up all day and declined mobility. Walked with nursing this morning. Will follow.   Marguarite Arbour A Icelyn Navarrete 05/02/2021, 1:27 PM Marisa Severin, PT, DPT Acute Rehabilitation Services Pager [phone] Office [phone]

## 2021-05-02 NOTE — Progress Notes (Signed)
[facility]  Date of Admission:  04/13/2021      Total days of antibiotics 19d   Meropenem 2/01 >> current          ASSESSMENT: Scott Foley is a 56 y.o. male admitted with necrotizing pancreatitis complicated by abscess formation - IR drain placed 2/6 with cx growing Kleb Pneumoniae and citrobacter freundii. Has been on Meropenem since admission. FU CT scan on 2/16 demonstrated some improvement. Only 10 cc recorded output from RLQ drain.   Pericardial effusion now s/p urgent cardiocentesis and drain placement. Thought to be from chronic contiguous inflammation and altered lymphatic drainage. Described to be bloody fluid. Preliminary gram stain shows GPCs. Could be a skin contaminant potentially. Will follow if there is any growth to make further determinations if we need to modify anything for treatment.    PLAN: Continue merrem  Follow micro for results and further recs Follow IR drain / output and plan    Principal Problem:   Necrotizing pancreatitis Active Problems:   Coronary artery disease with stable angina pectoris (HCC)   Hyperlipemia   Chronic obstructive pulmonary disease (HCC)   Essential hypertension   History of pulmonary embolism   Sepsis ([place])   AKI (acute kidney injury) ([place])   Hypokalemia   Chronic respiratory failure with hypoxia (HCC)   Normocytic anemia   Hypoglycemia   Protein-calorie malnutrition, severe   Pericardial effusion   Pericardial tamponade    (feeding supplement) PROSource Plus  30 mL Oral BID BM   sodium chloride   Intravenous Once   Chlorhexidine Gluconate Cloth  6 each Topical Daily   feeding supplement  237 mL Oral BID BM   fluticasone furoate-vilanterol  1 puff Inhalation Daily   And   umeclidinium bromide  1 puff Inhalation Daily   furosemide  20 mg Intravenous Q8H   multivitamin with minerals  1 tablet Oral Daily   mupirocin ointment  1 application Nasal BID   pantoprazole  40 mg  Oral BID   sodium chloride flush  5 mL Intracatheter Q8H   vitamin B-12  1,000 mcg Per Tube Daily    SUBJECTIVE: Says he feels a little bit better but still tired. Appetite better today and ate some breakfast.    Review of Systems: Review of Systems  Constitutional:  Negative for chills and fever.  Respiratory:  Negative for cough and shortness of breath.   Cardiovascular:  Negative for chest pain.  Gastrointestinal:  Negative for diarrhea, nausea and vomiting.  Skin:  Negative for itching and rash.   Allergies  Allergen Reactions   Penicillin G Hives    OBJECTIVE: Vitals:   05/02/21 0900 05/02/21 0920 05/02/21 1000 05/02/21 1100  BP: 126/79  124/81   Pulse: (!) 106 (!) 109 (!) 101   Resp: (!) 29 (!) 24 (!) 27   Temp:    98 F (36.7 C)  TempSrc:    Oral  SpO2: 93% 92% 93%   Weight:      Height:       Body mass index is 33.15 kg/m.  Physical Exam Constitutional:      Appearance: Normal appearance. He is well-developed. He is not ill-appearing.     Comments: Resting in recliner.   HENT:     Head: Normocephalic.     Mouth/Throat:     Mouth: Mucous membranes are moist.     Pharynx: Oropharynx is clear.  Eyes:  General: No scleral icterus. Cardiovascular:     Rate and Rhythm: Normal rate.  Pulmonary:     Effort: Pulmonary effort is normal.  Musculoskeletal:        General: Normal range of motion.     Cervical back: Normal range of motion.  Skin:    Coloration: Skin is not jaundiced or pale.  Neurological:     Mental Status: He is alert and oriented to person, place, and time.  Psychiatric:        Mood and Affect: Mood normal.        Judgment: Judgment normal.    Lab Results Lab Results  Component Value Date   WBC 13.0 (H) 05/02/2021   HGB 7.1 (L) 05/02/2021   HCT 22.3 (L) 05/02/2021   MCV 86.1 05/02/2021   PLT 383 05/02/2021    Lab Results  Component Value Date   CREATININE 1.11 05/02/2021   BUN 41 (H) 05/02/2021   NA 131 (L) 05/02/2021   K  3.9 05/02/2021   CL 99 05/02/2021   CO2 21 (L) 05/02/2021    Lab Results  Component Value Date   ALT 17 04/18/2021   AST 24 04/18/2021   ALKPHOS 77 04/18/2021   BILITOT 0.4 04/18/2021     Microbiology: Recent Results (from the past 240 hour(s))  Surgical PCR screen     Status: None   Collection Time: 05/01/21 11:34 AM   Specimen: Nasal Mucosa; Nasal Swab  Result Value Ref Range Status   MRSA, PCR NEGATIVE NEGATIVE Final   Staphylococcus aureus NEGATIVE NEGATIVE Final    Comment: (NOTE) The Xpert SA Assay (FDA approved for NASAL specimens in patients 42 years of age and older), is one component of a comprehensive surveillance program. It is not intended to diagnose infection nor to guide or monitor treatment. Performed at [facility], [place] [address], [place], [place] [address]   Culture, body fluid w Gram Stain-bottle     Status: None (Preliminary result)   Collection Time: 05/01/21 12:42 PM   Specimen: Pericardial  Result Value Ref Range Status   Specimen Description PERICARDIAL FLUID  Final   Special Requests SPEC A  Final   Gram Stain   Final    GRAM POSITIVE COCCI IN BOTH AEROBIC AND ANAEROBIC BOTTLES CRITICAL RESULT CALLED TO, READ BACK BY AND VERIFIED WITH: T ARMSTRONG,RN @0300  05/02/21 [place]    Culture   Final    NO GROWTH < 24 HOURS Performed at [facility], [place] [address], [place], [place] [address]    Report Status PENDING  Incomplete  Gram stain     Status: None   Collection Time: 05/01/21 12:42 PM   Specimen: Pericardial  Result Value Ref Range Status   Specimen Description PERICARDIAL FLUID  Final   Special Requests SPEC A  Final   Gram Stain   Final    FEW WBC PRESENT,BOTH PMN AND MONONUCLEAR NO ORGANISMS SEEN Performed at [facility], [address] [place], [place] [address]    Report Status 05/01/2021 FINAL  Final     Janene Madeira, MSN, NP-C [facility] Infectious Disease [email or web address] Pager: [phone] Office: [phone] [facility] Main Line: [place] Communication Welcome

## 2021-05-03 ENCOUNTER — Institutional Professional Consult (permissible substitution): Payer: Medicare HMO | Admitting: Pulmonary Disease

## 2021-05-03 DIAGNOSIS — K8591 Acute pancreatitis with uninfected necrosis, unspecified: Secondary | ICD-10-CM | POA: Diagnosis not present

## 2021-05-03 DIAGNOSIS — I10 Essential (primary) hypertension: Secondary | ICD-10-CM | POA: Diagnosis not present

## 2021-05-03 DIAGNOSIS — D649 Anemia, unspecified: Secondary | ICD-10-CM | POA: Diagnosis not present

## 2021-05-03 DIAGNOSIS — Z86711 Personal history of pulmonary embolism: Secondary | ICD-10-CM | POA: Diagnosis not present

## 2021-05-03 DIAGNOSIS — I314 Cardiac tamponade: Secondary | ICD-10-CM | POA: Diagnosis not present

## 2021-05-03 LAB — TYPE AND SCREEN
ABO/RH(D): A POS
Antibody Screen: NEGATIVE
Unit division: 0

## 2021-05-03 LAB — GLUCOSE, CAPILLARY
Glucose-Capillary: 92 mg/dL (ref 70–99)
Glucose-Capillary: 109 mg/dL — ABNORMAL HIGH (ref 70–99)
Glucose-Capillary: 101 mg/dL — ABNORMAL HIGH (ref 70–99)
Glucose-Capillary: 101 mg/dL — ABNORMAL HIGH (ref 70–99)

## 2021-05-03 LAB — BASIC METABOLIC PANEL
Anion gap: 9 (ref 5–15)
BUN: 27 mg/dL — ABNORMAL HIGH (ref 6–20)
CO2: 25 mmol/L (ref 22–32)
Calcium: 8.7 mg/dL — ABNORMAL LOW (ref 8.9–10.3)
Chloride: 95 mmol/L — ABNORMAL LOW (ref 98–111)
Creatinine, Ser: 0.98 mg/dL (ref 0.61–1.24)
GFR, Estimated: >60 mL/min (ref >60)
Glucose, Bld: 95 mg/dL (ref 70–99)
Potassium: 3.3 mmol/L — ABNORMAL LOW (ref 3.5–5.1)
Sodium: 129 mmol/L — ABNORMAL LOW (ref 135–145)

## 2021-05-03 LAB — GLUCOSE, BODY FLUID OTHER: Glucose, Body Fluid Other: <2 mg/dL

## 2021-05-03 LAB — PROTEIN, BODY FLUID (OTHER): Total Protein, Body Fluid Other: 6.3 g/dL

## 2021-05-03 LAB — LD, BODY FLUID (OTHER): LD, Body Fluid: 1088 IU/L

## 2021-05-03 LAB — BPAM RBC
Blood Product Expiration Date: 202303042359
ISSUE DATE / TIME: 202302201202
Unit Type and Rh: 6200

## 2021-05-03 LAB — CBC
HCT: 28.8 % — ABNORMAL LOW (ref 39.0–52.0)
Hemoglobin: 9.9 g/dL — ABNORMAL LOW (ref 13.0–17.0)
MCH: 28.9 pg (ref 26.0–34.0)
MCHC: 34.4 g/dL (ref 30.0–36.0)
MCV: 84.2 fL (ref 80.0–100.0)
Platelets: 376 10*3/uL (ref 150–400)
RBC: 3.42 MIL/uL — ABNORMAL LOW (ref 4.22–5.81)
RDW: 16.3 % — ABNORMAL HIGH (ref 11.5–15.5)
WBC: 15.7 10*3/uL — ABNORMAL HIGH (ref 4.0–10.5)
nRBC: 0.1 % (ref 0.0–0.2)

## 2021-05-03 LAB — PATHOLOGIST SMEAR REVIEW

## 2021-05-03 MED ORDER — SODIUM CHLORIDE 0.9 % IV SOLN
10.0000 mg/kg | Freq: Every day | INTRAVENOUS | Status: DC
  Administered 2021-05-03 – 2021-05-09 (×7): 700 mg via INTRAVENOUS
  Filled 2021-05-03 (×9): qty 14

## 2021-05-03 MED ORDER — POTASSIUM CHLORIDE CRYS ER 20 MEQ PO TBCR
40.0000 meq | EXTENDED_RELEASE_TABLET | Freq: Once | ORAL | Status: AC
  Administered 2021-05-03: 40 meq via ORAL
  Filled 2021-05-03: qty 2

## 2021-05-03 MED ORDER — METOPROLOL TARTRATE 25 MG PO TABS
25.0000 mg | ORAL_TABLET | Freq: Two times a day (BID) | ORAL | Status: DC
  Administered 2021-05-03 – 2021-05-06 (×8): 25 mg via ORAL
  Filled 2021-05-03 (×9): qty 1

## 2021-05-03 NOTE — Progress Notes (Signed)
Physical Therapy Treatment Patient Details Name: Scott Foley MRN: [id number] DOB: [birth year] Today's Date: 05/03/2021   History of Present Illness Pt is a 56 y.o. M who presents 04/13/2021 with abdominal pain. Admitted with acute pancreatitis with sepsis. S/p lap chole 03/02/2021 with abnormal IOC; underwent ERCP 12/22/222. S/p IR pec drain. Underwent pericardiocentesis on 2/19.  Significant PMH: CAD, HTN, HLD, COPD, PE, CVA, IDA, tobacco abuse.    PT Comments    Pt maintaining level of functional mobility. Able to participate in seated warm up exercise and ambulating 250 feet with no assistive device at a min guard assist level. RR up to 40 during activity; cues for slower, pursed lip breathing. Pt continues with dynamic balance deficits and decreased endurance. D/c plan remains appropriate.     Recommendations for follow up therapy are one component of a multi-disciplinary discharge planning process, led by the attending physician.  Recommendations may be updated based on patient status, additional functional criteria and insurance authorization.  Follow Up Recommendations  Home health PT     Assistance Recommended at Discharge PRN  Patient can return home with the following A little help with walking and/or transfers;A little help with bathing/dressing/bathroom;Help with stairs or ramp for entrance   Equipment Recommendations  Cane    Recommendations for Other Services       Precautions / Restrictions Precautions Precautions: Fall;Other (comment) Precaution Comments: RLQ drain, hemovac Restrictions Weight Bearing Restrictions: No     Mobility  Bed Mobility               General bed mobility comments: OOB in chair    Transfers Overall transfer level: Needs assistance Equipment used: None Transfers: Sit to/from Stand Sit to Stand: Supervision                Ambulation/Gait Ambulation/Gait assistance: Min guard Gait Distance (Feet): 250  Feet Assistive device: None Gait Pattern/deviations: Step-through pattern, Decreased stride length, Narrow base of support Gait velocity: decreased     General Gait Details: Narrower BOS, intermittently reaching for external support, min guard for safety. cues for upright posture and activity pacing   Stairs             Wheelchair Mobility    Modified Rankin (Stroke Patients Only)       Balance Overall balance assessment: Mild deficits observed, not formally tested                                          Cognition Arousal/Alertness: Awake/alert Behavior During Therapy: WFL for tasks assessed/performed, Flat affect Overall Cognitive Status: Within Functional Limits for tasks assessed                                          Exercises General Exercises - Lower Extremity Long Arc Quad: Both, 10 reps, Seated Hip Flexion/Marching: Both, 10 reps, Seated    General Comments        Pertinent Vitals/Pain Pain Assessment Pain Assessment: No/denies pain    Home Living                          Prior Function            PT Goals (current goals can now be found in the  care plan section) Acute Rehab PT Goals Potential to Achieve Goals: Good Progress towards PT goals: Progressing toward goals    Frequency    Min 2X/week      PT Plan Current plan remains appropriate;Frequency needs to be updated    Co-evaluation              AM-PAC PT "6 Clicks" Mobility   Outcome Measure  Help needed turning from your back to your side while in a flat bed without using bedrails?: None Help needed moving from lying on your back to sitting on the side of a flat bed without using bedrails?: None Help needed moving to and from a bed to a chair (including a wheelchair)?: A Little Help needed standing up from a chair using your arms (e.g., wheelchair or bedside chair)?: A Little Help needed to walk in hospital room?: A  Little Help needed climbing 3-5 steps with a railing? : A Little 6 Click Score: 20    End of Session   Activity Tolerance: Patient tolerated treatment well Patient left: in chair;with call bell/phone within reach Nurse Communication: Mobility status PT Visit Diagnosis: Unsteadiness on feet (R26.81);Muscle weakness (generalized) (M62.81);Difficulty in walking, not elsewhere classified (R26.2);Pain     Time: 5670-1410 PT Time Calculation (min) (ACUTE ONLY): 21 min  Charges:  $Gait Training: 8-22 mins                     Wyona Almas, PT, DPT Acute Rehabilitation Services Pager [phone] Office [phone]    [name] 05/03/2021, 5:03 PM

## 2021-05-03 NOTE — Progress Notes (Signed)
°  Pancreatitis and pseudocyst   Drain Location: RUQ Size: Fr size: 12 Fr Date of placement: 04/18/21  Currently to: Drain collection device: gravity 24 hour output:  Output by Drain (mL) 05/01/21 0701 - 05/01/21 1900 05/01/21 1901 - 05/02/21 0700 05/02/21 0701 - 05/02/21 1900 05/02/21 1901 - 05/03/21 0654  Closed System Drain 1 Right RLQ Other (Comment) 12 Fr. 10 0 30 5  Closed System Drain Left;Anterior Pericardial Accordion (Hemovac) 85 0  5    Interval imaging/drain manipulation:  CT 2/16: Evolving changes of necrotizing pancreatitis. Stable enhancement of the residual pancreatic parenchyma. Increasing organization peripancreatic necrosis adjacent to the head and body of the pancreas with areas of more formal walled-off necrosis adjacent to the pancreatic head demonstrating slight interval decrease in size since prior examination. Intraluminal gas within areas of walled-off necrosis are again identified and superinfection is not excluded.      Interval percutaneous drainage of area retroperitoneal walled-off necrosis within the right iliac fossa with interval decrease in size of loculated collection  Current examination: Flushes/aspirates easily.  Insertion site unremarkable. Suture and stat lock in place. Dressed appropriately.   Plan: Continue TID flushes with 5 cc NS. Record output Q shift. Dressing changes QD or PRN if soiled.  Call IR APP or on call IR MD if difficulty flushing or sudden change in drain output.  Repeat imaging/possible drain injection once output < 10 mL/QD (excluding flush material.)  IR will follow-- when OP less than 10 cc/24 hrs--- may need re CT Plan per [facility] and Cardiology  IR will continue to follow - please call with questions or concerns.

## 2021-05-03 NOTE — Progress Notes (Signed)
Brief ID note:  Cultures reviewed.  Pericardial fluid growing Enterococcus faecium with susceptibilities pending.  Will add daptomycin at this time.  Continue meropenem.   Raynelle [facility] for Infectious Disease [facility] 05/03/2021, 12:15 PM

## 2021-05-03 NOTE — Progress Notes (Signed)
Pharmacy Antibiotic Note  Scott Foley is a 56 y.o. male admitted on 04/13/2021 with E faecium in pericardial fluid. Pharmacy has been consulted for Daptomycin dosing.  Plan: - Start Daptomycin 700 mg (~10 mg/kg AdjBW) every 24 hours - Will continue to follow renal function, culture results, LOT, and antibiotic de-escalation plans   Height: 5\' 4"  (162.6 cm) Weight: 87.6 kg (193 lb 2 oz) IBW/kg (Calculated) : 59.2  Temp (24hrs), Avg:98.2 F (36.8 C), Min:97.6 F (36.4 C), Max:98.7 F (37.1 C)  Recent Labs  Lab 04/27/21 0412 04/29/21 0303 04/30/21 0214 05/01/21 0220 05/02/21 0120 05/03/21 0007  WBC 16.5* 15.2* 15.3* 15.1* 13.0* 15.7*  CREATININE 1.11 1.03  --  1.17 1.11 0.98    Estimated Creatinine Clearance: 84 mL/min (by C-G formula based on SCr of 0.98 mg/dL).    Allergies  Allergen Reactions   Penicillin G Hives    Antimicrobials this admission: Meropenem 2/1 >> Daptomycin 2/1 >>  Dose adjustments this admission:   Microbiology results: 2/1 BCx >> NGf 2/1 UCx >> NGf 2/6 Abscess cx >> Kleb PNA + Citrobacter freundii 2/19 pericardial fluid >> Enterococcus faecium  Thank you for allowing pharmacy to be a part of this patients care.  Alycia Rossetti, PharmD, BCPS Clinical Pharmacist 05/03/2021 12:24 PM   **Pharmacist phone directory can now be found on [email or web address] ([email or web address]).  Listed under [place].

## 2021-05-03 NOTE — Progress Notes (Signed)
Cardiology Progress Note  Patient ID: Scott Foley MRN: [id number] DOB: 29-Dec-1965 Date of Encounter: 05/03/2021  Primary Cardiologist: None  Subjective   Chief Complaint: Weakness  HPI: clinical status improved s/p pericardiocentesis. He's on 2L. Blood pressure is stable.  ROS:  All other ROS reviewed and negative. Pertinent positives noted in the HPI.     Inpatient Medications  Scheduled Meds:  (feeding supplement) PROSource Plus  30 mL Oral BID BM   Chlorhexidine Gluconate Cloth  6 each Topical Daily   feeding supplement  237 mL Oral BID BM   fluticasone furoate-vilanterol  1 puff Inhalation Daily   And   umeclidinium bromide  1 puff Inhalation Daily   multivitamin with minerals  1 tablet Oral Daily   mupirocin ointment  1 application Nasal BID   pantoprazole  40 mg Oral BID   sodium chloride flush  5 mL Intracatheter Q8H   vitamin B-12  1,000 mcg Per Tube Daily   Continuous Infusions:  sodium chloride     meropenem (MERREM) IV 1 g (05/03/21 0007)   PRN Meds: acetaminophen, albuterol, morphine injection, ondansetron **OR** ondansetron (ZOFRAN) IV, oxyCODONE   Vital Signs   Vitals:   05/03/21 0500 05/03/21 0600 05/03/21 0700 05/03/21 0754  BP: (!) 141/92 (!) 140/105 (!) 159/106   Pulse: (!) 110 (!) 110 (!) 115   Resp: 17 20 (!) 29   Temp:    97.9 F (36.6 C)  TempSrc:    Oral  SpO2: (!) 89% 91% 91%   Weight:      Height:        Intake/Output Summary (Last 24 hours) at 05/03/2021 0802 Last data filed at 05/03/2021 0700 Gross per 24 hour  Intake 1095 ml  Output 3545 ml  Net -2450 ml   Last 3 Weights 05/02/2021 04/25/2021 04/24/2021  Weight (lbs) 193 lb 2 oz 207 lb 3.7 oz 207 lb 3.7 oz  Weight (kg) 87.6 kg 94 kg 94 kg      Telemetry  Sinus tachycardia  Physical Exam   Vitals:   05/03/21 0500 05/03/21 0600 05/03/21 0700 05/03/21 0754  BP: (!) 141/92 (!) 140/105 (!) 159/106   Pulse: (!) 110 (!) 110 (!) 115   Resp: 17 20 (!) 29   Temp:    97.9  F (36.6 C)  TempSrc:    Oral  SpO2: (!) 89% 91% 91%   Weight:      Height:        Intake/Output Summary (Last 24 hours) at 05/03/2021 0802 Last data filed at 05/03/2021 0700 Gross per 24 hour  Intake 1095 ml  Output 3545 ml  Net -2450 ml    Last 3 Weights 05/02/2021 04/25/2021 04/24/2021  Weight (lbs) 193 lb 2 oz 207 lb 3.7 oz 207 lb 3.7 oz  Weight (kg) 87.6 kg 94 kg 94 kg    Body mass index is 33.15 kg/m.   Physical Exam Gen: cachectic, on acute distress. Sitting in a chair Neuro: alert and oriented CV: tachycardic no murmurs. No JVD. Drain site clean and dry Pulm: nl wob, decreased lung sounds in the bases Abd: non distended Ext: No LE edema Skin: warm and well perfused Psych: normal mood  Pericardial Drain- minimal serosanguinous fluid   Labs  High Sensitivity Troponin:  No results for input(s): TROPONINIHS in the last 720 hours.   Cardiac EnzymesNo results for input(s): TROPONINI in the last 168 hours. No results for input(s): TROPIPOC in the last 168 hours.  Chemistry Recent  Labs  Lab 05/01/21 0220 05/02/21 0120 05/03/21 0007  NA 130* 131* 129*  K 5.0 3.9 3.3*  CL 97* 99 95*  CO2 19* 21* 25  GLUCOSE 110* 90 95  BUN 37* 41* 27*  CREATININE 1.17 1.11 0.98  CALCIUM 9.5 8.6* 8.7*  GFRNONAA >60 >60 >60  ANIONGAP 14 11 9     Hematology Recent Labs  Lab 05/01/21 0220 05/02/21 0120 05/02/21 1610 05/03/21 0007  WBC 15.1* 13.0*  --  15.7*  RBC 2.78* 2.59*  --  3.42*  HGB 7.8* 7.1* 10.0* 9.9*  HCT 24.2* 22.3* 28.7* 28.8*  MCV 87.1 86.1  --  84.2  MCH 28.1 27.4  --  28.9  MCHC 32.2 31.8  --  34.4  RDW 17.4* 17.4*  --  16.3*  PLT 461* 383  --  376   BNPNo results for input(s): BNP, PROBNP in the last 168 hours.  DDimer No results for input(s): DDIMER in the last 168 hours.   Radiology  CARDIAC CATHETERIZATION  Result Date: 05/01/2021 Successful pericardiocentesis from an intercostal approach with removal of 600 cc of hemorrhagic fluid. Plan: will leave  drain in place for now. Check portable CXR. Fluid sent for analysis. Will hold IV heparin.   DG CHEST PORT 1 VIEW  Result Date: 05/01/2021 CLINICAL DATA:  Pericardial effusion EXAM: PORTABLE CHEST 1 VIEW COMPARISON:  05/01/2021, 9:51 a.m. FINDINGS: Unchanged cardiomegaly. Diffuse bilateral interstitial pulmonary opacity. Small, layering bilateral pleural effusions. The visualized skeletal structures are unremarkable. IMPRESSION: 1. Unchanged AP portable examination. Cardiomegaly in keeping with known pericardial effusion. 2. Diffuse bilateral interstitial pulmonary opacity and small, layering bilateral pleural effusions, consistent with edema. No new airspace opacity. Electronically Signed   By: Delanna Ahmadi M.D.   On: 05/01/2021 17:00   DG CHEST PORT 1 VIEW  Result Date: 05/01/2021 CLINICAL DATA:  Dyspnea R06.00 (ICD-10-CM) EXAM: PORTABLE CHEST 1 VIEW COMPARISON:  February 1, 23. FINDINGS: Low lung volumes. New retrocardiac opacity.Otherwise, similar patchy mild opacities in bilateral mid basilar lungs. Question nodular opacity in the right midlung. No definite pleural effusions or pneumothorax. Cardiomediastinal silhouette is mildly enlarged, likely accentuated by low lung volumes and AP technique. IMPRESSION: 1. Low lung volumes with new retrocardiac opacity, which could represent atelectasis, aspiration, and/or pneumonia. Otherwise, similar patchy mild opacities in bilateral mid basilar lungs, suspicious for pneumonia. 2. Question nodular opacity in the right midlung. Recommend CT chest to further evaluate for pulmonary nodule/malignancy. Electronically Signed   By: Margaretha Sheffield M.D.   On: 05/01/2021 11:12   ECHOCARDIOGRAM LIMITED  Result Date: 05/02/2021    ECHOCARDIOGRAM LIMITED REPORT   Patient Name:   Scott Foley Date of Exam: 05/02/2021 Medical Rec #:  [id number]             Height:       64.0 in Accession #:    [id number]            Weight:       193.1 lb Date of Birth:  03-02-1966              BSA:          1.927 m Patient Age:    56 years              BP:           124/81 mmHg Patient Gender: M                     HR:  99 bpm. Exam Location:  Inpatient Procedure: Limited Echo, Cardiac Doppler and Color Doppler Indications:    Follow up pericardial effusion  History:        Patient has prior history of Echocardiogram examinations, most                 recent 05/01/2021. CHF, CAD, COPD, Signs/Symptoms:Chest Pain;                 Risk Factors:Hypertension and Dyslipidemia. 05/01/2021                 Pericardiocentensis.  Sonographer:    Luisa Hart RDCS Referring Phys: [id number] PETER M [place] IMPRESSIONS  1. Left ventricular ejection fraction, by estimation, is 55 to 60%. The left ventricle has normal function.  2. The moderate pericardial effusion has been tapped . The remaining pericardial effusion is now trivial.  3. The mitral valve is grossly normal. Trivial mitral valve regurgitation. FINDINGS  Left Ventricle: Left ventricular ejection fraction, by estimation, is 55 to 60%. The left ventricle has normal function. Pericardium: The moderate pericardial effusion has been tapped . The remaining pericardial effusion is now trivial. Trivial pericardial effusion is present. Mitral Valve: The mitral valve is grossly normal. Trivial mitral valve regurgitation. Aortic Valve: Aortic valve mean gradient measures 5.0 mmHg. Aortic valve peak gradient measures 9.1 mmHg.  Diastology LV e' medial:  8.92 cm/s LV e' lateral: 9.25 cm/s  RIGHT VENTRICLE RV Basal diam:  3.00 cm RV Mid diam:    2.60 cm RV S prime:     13.30 cm/s TAPSE (M-mode): 1.2 cm LEFT ATRIUM           Index        RIGHT ATRIUM           Index LA Vol (A4C): 48.2 ml 25.01 ml/m  RA Area:     10.70 cm                                    RA Volume:   21.40 ml  11.10 ml/m  AORTIC VALVE AV Vmax:           151.00 cm/s AV Vmean:          103.000 cm/s AV VTI:            0.234 m AV Peak Grad:      9.1 mmHg AV Mean Grad:      5.0 mmHg LVOT Vmax:          127.00 cm/s LVOT Vmean:        81.700 cm/s LVOT VTI:          0.203 m LVOT/AV VTI ratio: 0.87 TRICUSPID VALVE TR Peak grad:   37.9 mmHg TR Vmax:        308.00 cm/s  SHUNTS Systemic VTI: 0.20 m Mertie Moores MD Electronically signed by Mertie Moores MD Signature Date/Time: 05/02/2021/1:47:55 PM    Final    ECHOCARDIOGRAM LIMITED  Result Date: 05/01/2021    ECHOCARDIOGRAM LIMITED REPORT   Patient Name:   Scott Foley Date of Exam: 05/01/2021 Medical Rec #:  [id number]             Height:       64.0 in Accession #:    [id number]            Weight:       207.2 lb Date of Birth:  1965-12-19             BSA:          1.986 m Patient Age:    56 years              BP:           116/75 mmHg Patient Gender: M                     HR:           110 bpm. Exam Location:  Inpatient Procedure: Limited Echo Indications:    Pericardial effusion and pericardiocentesis.  History:        Patient has prior history of Echocardiogram examinations, most                 recent 04/30/2021. CAD, COPD; Risk Factors:Hypertension and                 Dyslipidemia.  Sonographer:    Clayton Lefort RDCS (AE) Referring Phys: [id number] PETER M [place] IMPRESSIONS  1. Left ventricular ejection fraction, by estimation, is 55 to 60%. The left ventricle has normal function. The left ventricle demonstrates regional wall motion abnormalities abnormal septal motion.  2. Right ventricular systolic function is normal. The right ventricular size is normal.  3. Moderate pericardial effusion. The pericardial effusion is lateral to the left ventricle.  4. The mitral valve is normal in structure. No evidence of mitral valve regurgitation.  5. Compared to prior effusion size has improved. While there is no RV or RA collapse, there is a persistent pericardial effusion, localized lateral to the LV. Patient has tachycardia. The IVC was not assessed. Comparison(s): Persistent pericardial effusion. FINDINGS  Left Ventricle: Left ventricular ejection fraction, by  estimation, is 55 to 60%. The left ventricle has normal function. The left ventricle demonstrates regional wall motion abnormalities. Right Ventricle: The right ventricular size is normal. Right ventricular systolic function is normal. Pericardium: A moderately sized pericardial effusion is present. The pericardial effusion is lateral to the left ventricle. Mitral Valve: The mitral valve is normal in structure. Tricuspid Valve: The tricuspid valve is normal in structure. Rudean Haskell MD Electronically signed by Rudean Haskell MD Signature Date/Time: 05/01/2021/1:51:12 PM    Final     Cardiac Studies  TTE 04/30/2021  1. Respiratory variation is 34% across the MV. The RA is not well  visualized. There is evidence of early RV diastolic collapse. Similar  findings of tamponade physiology to 04/28/2021 echo. Chart reviewed,  hemodynamics stable since last echo. Appears plan   has been close inpatient monitoring, potential pericardiocentesis if  becomes clinically indicated. . Large pericardial effusion. The  pericardial effusion is circumferential.   2. The inferior vena cava is dilated in size with <50% respiratory  variability, suggesting right atrial pressure of 15 mmHg.   3. Limited echo to evaluate pericardial effusion   Patient Profile  Scott Foley is a 56 y.o. male with CAD, hypertension, PE on Eliquis, stroke, iron deficiency anemia admitted on 04/13/2021 with severe acute pancreatitis with sepsis.  Cardiology was consulted on 04/28/2021 for large pericardial effusion seen on CT scan.  Pericardiocentesis was delayed due to necrotizing pancreatitis.  Evaluation on 05/01/2021 shows worsening hypotension and tachycardia concerning for pericardial tamponade.  Currently admitted to the hospital with complications of necrotizing pancreatitis.  He developed pancreatitis post ERCP from a sending cholangitis in December 2023.  He has had a rather complicated course with recurrent  pancreatitis  as well as necrotizing pancreatitis requiring drainage recently.  He has had multiple aspirations of pancreatic fluid collections. CT scan on 04/28/2021 demonstrated large circumferential pericardial effusion.    Assessment & Plan   #Large pericardial effusion with pericardial tamponade #Hypotension #Cardiogenic shock - resolved, s/p pericardiocentesis and drain - off pressors  -if he develops pleuritic chest pain will consider tx for pericarditis - s/p drain placement 2/19 - output 10 cc - will plan at least another day with the drain, then repeat limited echo tomorrow. Output < 50 cc in 24 hours .  #Sinus tachycardia: ddx includes dehydration/sepsis - fluids as needed - on meropenem  #Necrotizing pancreatitis #Sepsis -Treatment per hospital medicine -on Abx  #Anemia -appears to be chronic -stable  #History of PE - continue to hold [facility] until drain removed   For questions or updates, please contact [place] Please consult [email or web address] for contact info under     Janina Mayo, MD

## 2021-05-03 NOTE — Progress Notes (Signed)
[facility] PROGRESS NOTE   Camden Knotek ZVJ:[id number] DOB: Dec 22, 1965 DOA: 04/13/2021  20 DOS: the patient was seen and examined on 05/03/2021  PCP: [name], MD  Brief History and Hospital Course:  56 y.o. male with medical history significant of hx of CAD, HTN, hx of PE on eliquis, HLD, hxo of CVA in 2014, IDA with history of recent hospitalizaion on 03/16/21 for recurrent pancreatitis, sepsis due to bacteremia from klebsiella pneumoniae, covid-19 and respiratory failure requiring home oxygen on discharge.  Presented with a 4-day history of diffuse worsening abdominal pain.  He has had abdominal issues for the past 2 months.  He had a cholecystectomy on 12/21 and underwent ERCP on 12/22 since the intraoperative cholangiogram showed a CBD stone.  After ERCP he developed pancreatitis.  He was managed conservatively and then discharged home on 12/28.  He was readmitted to [facility] on 1/4 for recurrent pancreatitis and COVID-19.  Noted to have sepsis respiratory failure.  Blood cultures were positive for Klebsiella pneumonia.  He was treated with Invanz for 10 days.  Discharged on 2 L of oxygen. He had a postop visit with surgeon on 04/12/21 when he complained of abdominal pain.  A CT scan was subsequently done which showed pancreatic necrosis with gas within the pancreatic fluid collections.  He was sent over to [facility] emergency department.  Interventional radiology was consulted for aspiration of the fluid collection which was performed on 2/6.   CT scan of the abdomen pelvis was repeated on 2/16.  Showed stable findings for the most part. Incidentally noted to have significant pericardial effusion with features concerning for tamponade.  Cardiology was consulted.  Patient underwent pericardiocentesis on 2/19. Transfused 1 unit of PRBC on 2/20.  Given Lasix after transfusion.  Consultants: Gastroenterology.  Interventional radiology.  General surgery.  Infectious  disease  Procedures:  Drainage of fluid collection in the abdomen by interventional radiology on 2/6  Transthoracic echocardiogram 2/17  Pericardiocentesis 2/19.   Subjective: Denies any complaints this morning.  No chest pain.  Mild shortness of breath.  No nausea vomiting.    Assessment/Plan:   * Necrotizing pancreatitis- (present on admission) This is either a third episode of pancreatitis or sequelae of pancreatitis from his episode in January.  His lipase level was noted to be normal. CT scan was done at [facility] which apparently showed progressive severe pancreatitis with extensive fluid collection surrounding the pancreas and extending into the right retroperitoneum.  Many of these fluid collections contained gas concerning for infection.  Compression of splenic vein and SMV was noted although they appear to be patent.  Patient was started on meropenem.   Patient was seen by gastroenterology and general surgery.  Both of these services have signed off.   Interventional radiology consulted for percutaneous drainage which was done on 2/6 Patient was started on cortrak tube feedings.  Diet was slowly advanced.  Cortrak feeding tube was removed subsequently. CT scan of the abdomen pelvis was repeated on 2/16.   Evolving changes of necrotizing pancreatitis noted.  Mostly stable findings.  Fluid collection which was drained was noted to be decreased in size.  Wide patency of the superior mesenteric, splenic and portal veins were noted. ID recommends continuing meropenem. Drain to remain in place for now.  IR continues to follow intermittently. Pain is reasonably well controlled.  Pericardial effusion Cardiac tamponade  CT scan incidentally showed clinically significant pericardial effusion.  There was concern for tamponade although patient was noted  to be hemodynamically stable.  Repeat echocardiograms continue to show tamponade features.   Etiology is thought to be secondary to  inflammation from the pancreas.   Patient blood pressure started dropping.  He was in cardiogenic shock.  Required pressors briefly.   He underwent urgent pericardiocentesis on 2/19.  Bloody fluid was noted.  His anticoagulation has been discontinued. He was also noted to be tachycardic. After the procedure his blood pressure and heart rate have improved.  Cardiology continues to follow.  Echocardiogram from 2/20 showed trivial pericardial effusion. Drain removal will be deferred to cardiology. Pericardial fluid Gram stain showed gram-positive cocci.  Discussed with ID.  We will wait for final identification.  No change to antibiotic regimen at this time.  Normocytic anemia- (present on admission) Patient did have a significant drop in hemoglobin most of which was thought to be secondary to dilution.  Could also have blood into the pericardial space.   Anemia panel reviewed.  Vitamin B12 noted to be low normal at 254.  Being supplemented.  Folate 8.2.  Ferritin 1033.  TIBC 137.  Iron 16. He was transfused 1 unit of PRBC during the early part of this admission.  Hemoglobin again drifted down to 7.  Transfused another unit of PRBC on 2/20.  Was given Lasix afterwards.  He Hemoglobin has responded appropriately.  Continue to monitor.   Sepsis ([place])- (present on admission) On admission he met sepsis criteria with tachycardia, fever to 100.8, WBC to 18.2.  Recently noted to have Klebsiella bacteremia earlier in January when he was hospitalized in [place].  Blood cultures done here in the hospital have been negative so far. Sepsis physiology improved.   Remains afebrile.  WBC slowly improving. Cultures from intra-abdominal fluid collection sent after aspiration 2/6- kleb/citrobacter Infectious disease continues to follow.  Remains on meropenem.  Chronic respiratory failure with hypoxia ([place])- (present on admission) Recently recovered from COVID-19 infection.  Was discharged on 2 L of oxygen by  nasal cannula.  Admission chest x-ray showed opacities which could be from his recent infection rather than any infection.   Was noted to be more dyspneic yesterday.  X-ray was done which showed diffuse bilateral opacities seen previously as well.  Could reflect recovery from COVID-19.  Could also suggest pulmonary edema. Was given Lasix after transfusion.  He diuresed well. Remains on 2 L of oxygen by nasal cannula.  Chronic obstructive pulmonary disease ([place])- (present on admission) No signs of exacerbation. Continue trelegy daily and albuterol prn.  History of pulmonary embolism- (present on admission) He was on Eliquis prior to admission.  This was placed on hold due to his abdominal issues and possible need for procedures.  He was transitioned to IV heparin.  Developed pericardial effusion.  Pericardiocentesis was done which showed that the fluid was bloody.  Anticoagulation has been discontinued for now.  Will likely need to hold going forward.   His episode of PE and DVT was in December of 2020.   Essential hypertension- (present on admission) Sinus tachycardia  ACE inhibitor on hold due to borderline low blood pressures. He was on metoprolol with good control of heart rate.  But since his blood pressure was low over the weekend metoprolol was discontinued by cardiology.  Should be okay to resume it now since blood pressures have improved.  Hyperlipemia- (present on admission) Statin on hold currently.  Triglyceride was 121 in December.  Coronary artery disease with stable angina pectoris ([place])- (present on admission) Stable.  Plavix currently  on hold.   Protein-calorie malnutrition, severe Feeding tube has been removed.  He is doing very well with oral intake.  Nutrition Status: Nutrition Problem: Severe Malnutrition Etiology: acute illness (pancreatitis) Signs/Symptoms: moderate muscle depletion, moderate fat depletion, energy intake < or equal to 50% for > or equal to 5 days,  percent weight loss (11% weight loss within 3 months) Percent weight loss: 11 % Interventions: Tube feeding    Hypokalemia- (present on admission) Hypomagnesemia  Will give dose of potassium today.  Check magnesium tomorrow.  AKI (acute kidney injury) ([place])- (present on admission) Presented with creatinine of 2.49. This was likely prerenal in the setting of sepsis along with nausea and vomiting over the past few days.   Patient aggressively hydrated.  Renal function improved.  Sodium level noted to be low this morning.  Continue to monitor.  Hypoglycemia Resolved with initiation of fluids and nutrition.   Obesity Estimated body mass index is 33.15 kg/m as calculated from the following:   Height as of this encounter: '5\' 4"'  (1.626 m).   Weight as of this encounter: 87.6 kg.    DVT Prophylaxis: IV heparin discontinued due to bloody pericardial effusion.  SCDs. Code Status: Full code Family Communication: Discussed with patient.  Wife being updated every few days. Disposition Plan: Hopefully return home when improved  Status is: Inpatient  Remains inpatient appropriate because: Acute pancreatitis, infected intra-abdominal fluid collection       Medications: Scheduled:  (feeding supplement) PROSource Plus  30 mL Oral BID BM   Chlorhexidine Gluconate Cloth  6 each Topical Daily   feeding supplement  237 mL Oral BID BM   fluticasone furoate-vilanterol  1 puff Inhalation Daily   And   umeclidinium bromide  1 puff Inhalation Daily   multivitamin with minerals  1 tablet Oral Daily   mupirocin ointment  1 application Nasal BID   pantoprazole  40 mg Oral BID   sodium chloride flush  5 mL Intracatheter Q8H   vitamin B-12  1,000 mcg Per Tube Daily   Continuous:  sodium chloride     meropenem (MERREM) IV 1 g (05/03/21 0007)   TFT:[id number], albuterol, morphine injection, ondansetron **OR** ondansetron (ZOFRAN) IV, oxyCODONE  Antibiotics: Anti-infectives (From  admission, onward)    Start     Dose/Rate Route Frequency Ordered Stop   04/16/21 1600  meropenem (MERREM) 1 g in sodium chloride 0.9 % 100 mL IVPB        1 g 200 mL/hr over 30 Minutes Intravenous Every 8 hours 04/16/21 1234     04/13/21 2000  meropenem (MERREM) 1 g in sodium chloride 0.9 % 100 mL IVPB  Status:  Discontinued        1 g 200 mL/hr over 30 Minutes Intravenous Every 12 hours 04/13/21 1934 04/16/21 1234   04/13/21 1800  ceFEPIme (MAXIPIME) 2 g in sodium chloride 0.9 % 100 mL IVPB  Status:  Discontinued        2 g 200 mL/hr over 30 Minutes Intravenous Every 12 hours 04/13/21 1731 04/13/21 1852   04/13/21 1715  levofloxacin (LEVAQUIN) IVPB 750 mg  Status:  Discontinued        750 mg 100 mL/hr over 90 Minutes Intravenous  Once 04/13/21 1713 04/13/21 1728   04/13/21 1715  metroNIDAZOLE (FLAGYL) IVPB 500 mg  Status:  Discontinued        500 mg 100 mL/hr over 60 Minutes Intravenous  Once 04/13/21 1713 04/13/21 1852       Objective:  Vital Signs  Vitals:   05/03/21 0600 05/03/21 0700 05/03/21 0754 05/03/21 0757  BP: (!) 140/105 (!) 159/106    Pulse: (!) 110 (!) 115    Resp: 20 (!) 29    Temp:   97.9 F (36.6 C)   TempSrc:   Oral   SpO2: 91% 91% 91% 92%  Weight:      Height:        Intake/Output Summary (Last 24 hours) at 05/03/2021 0910 Last data filed at 05/03/2021 0700 Gross per 24 hour  Intake 995 ml  Output 3545 ml  Net -2550 ml   Filed Weights   04/24/21 0500 04/25/21 0500 05/02/21 0500  Weight: 94 kg 94 kg 87.6 kg    General appearance: Awake alert.  In no distress Resp: Noted to be mildly tachypneic.  No wheezing.  Crackles at the bases.  No rhonchi. Cardio: S1-S2 is tachycardic regular.  No S3-S4. GI: Abdomen is soft.  Nontender nondistended.  Bowel sounds are present normal.  No masses organomegaly Extremities: No edema. Neurologic: Alert and oriented x3.  No focal neurological deficits.     Lab Results:  Data Reviewed: I have personally  reviewed labs and imaging study reports  CBC: Recent Labs  Lab 04/29/21 0303 04/30/21 0214 05/01/21 0220 05/02/21 0120 05/02/21 1610 05/03/21 0007  WBC 15.2* 15.3* 15.1* 13.0*  --  15.7*  HGB 8.0* 7.9* 7.8* 7.1* 10.0* 9.9*  HCT 24.7* 24.4* 24.2* 22.3* 28.7* 28.8*  MCV 86.4 86.8 87.1 86.1  --  84.2  PLT 412* 445* 461* 383  --  161    Basic Metabolic Panel: Recent Labs  Lab 04/27/21 0412 04/29/21 0303 05/01/21 0220 05/02/21 0120 05/03/21 0007  NA 132* 132* 130* 131* 129*  K 4.0 4.2 5.0 3.9 3.3*  CL 100 100 97* 99 95*  CO2 23 22 19* 21* 25  GLUCOSE 120* 115* 110* 90 95  BUN 27* 28* 37* 41* 27*  CREATININE 1.11 1.03 1.17 1.11 0.98  CALCIUM 8.9 9.0 9.5 8.6* 8.7*  MG  --   --  2.4  --   --     GFR: Estimated Creatinine Clearance: 84 mL/min (by C-G formula based on SCr of 0.98 mg/dL).   CBG: Recent Labs  Lab 05/01/21 1603 05/02/21 0729 05/02/21 1109 05/02/21 1559 05/03/21 0648  GLUCAP 90 79 76 95 92     Recent Results (from the past 240 hour(s))  Surgical PCR screen     Status: None   Collection Time: 05/01/21 11:34 AM   Specimen: Nasal Mucosa; Nasal Swab  Result Value Ref Range Status   MRSA, PCR NEGATIVE NEGATIVE Final   Staphylococcus aureus NEGATIVE NEGATIVE Final    Comment: (NOTE) The Xpert SA Assay (FDA approved for NASAL specimens in patients 64 years of age and older), is one component of a comprehensive surveillance program. It is not intended to diagnose infection nor to guide or monitor treatment. Performed at [facility], [place] [address], [place], [place] [address]   Culture, body fluid w Gram Stain-bottle     Status: None (Preliminary result)   Collection Time: 05/01/21 12:42 PM   Specimen: Pericardial  Result Value Ref Range Status   Specimen Description PERICARDIAL FLUID  Final   Special Requests SPEC A  Final   Gram Stain   Final    GRAM POSITIVE COCCI IN BOTH AEROBIC AND ANAEROBIC BOTTLES CRITICAL RESULT CALLED TO, READ  BACK BY AND VERIFIED WITH: T ARMSTRONG,RN '@0300'  05/02/21 [place]  Culture   Final    NO GROWTH 2 DAYS Performed at [facility], [place] [address], [place], [place] [address]    Report Status PENDING  Incomplete  Gram stain     Status: None   Collection Time: 05/01/21 12:42 PM   Specimen: Pericardial  Result Value Ref Range Status   Specimen Description PERICARDIAL FLUID  Final   Special Requests SPEC A  Final   Gram Stain   Final    FEW WBC PRESENT,BOTH PMN AND MONONUCLEAR NO ORGANISMS SEEN Performed at [facility], [address] [place], [place] [address]    Report Status 05/01/2021 FINAL  Final       Radiology Studies: CARDIAC CATHETERIZATION  Result Date: 05/01/2021 Successful pericardiocentesis from an intercostal approach with removal of 600 cc of hemorrhagic fluid. Plan: will leave drain in place for now. Check portable CXR. Fluid sent for analysis. Will hold IV heparin.   DG CHEST PORT 1 VIEW  Result Date: 05/01/2021 CLINICAL DATA:  Pericardial effusion EXAM: PORTABLE CHEST 1 VIEW COMPARISON:  05/01/2021, 9:51 a.m. FINDINGS: Unchanged cardiomegaly. Diffuse bilateral interstitial pulmonary opacity. Small, layering bilateral pleural effusions. The visualized skeletal structures are unremarkable. IMPRESSION: 1. Unchanged AP portable examination. Cardiomegaly in keeping with known pericardial effusion. 2. Diffuse bilateral interstitial pulmonary opacity and small, layering bilateral pleural effusions, consistent with edema. No new airspace opacity. Electronically Signed   By: Delanna Ahmadi M.D.   On: 05/01/2021 17:00   DG CHEST PORT 1 VIEW  Result Date: 05/01/2021 CLINICAL DATA:  Dyspnea R06.00 (ICD-10-CM) EXAM: PORTABLE CHEST 1 VIEW COMPARISON:  February 1, 23. FINDINGS: Low lung volumes. New retrocardiac opacity.Otherwise, similar patchy mild opacities in bilateral mid basilar lungs. Question nodular opacity in the right midlung. No definite pleural effusions or  pneumothorax. Cardiomediastinal silhouette is mildly enlarged, likely accentuated by low lung volumes and AP technique. IMPRESSION: 1. Low lung volumes with new retrocardiac opacity, which could represent atelectasis, aspiration, and/or pneumonia. Otherwise, similar patchy mild opacities in bilateral mid basilar lungs, suspicious for pneumonia. 2. Question nodular opacity in the right midlung. Recommend CT chest to further evaluate for pulmonary nodule/malignancy. Electronically Signed   By: Margaretha Sheffield M.D.   On: 05/01/2021 11:12   ECHOCARDIOGRAM LIMITED  Result Date: 05/02/2021    ECHOCARDIOGRAM LIMITED REPORT   Patient Name:   NAT LOWENTHAL Date of Exam: 05/02/2021 Medical Rec #:  [id number]             Height:       64.0 in Accession #:    [id number]            Weight:       193.1 lb Date of Birth:  05-09-1965             BSA:          1.927 m Patient Age:    35 years              BP:           124/81 mmHg Patient Gender: M                     HR:           99 bpm. Exam Location:  Inpatient Procedure: Limited Echo, Cardiac Doppler and Color Doppler Indications:    Follow up pericardial effusion  History:        Patient has prior history of Echocardiogram examinations, most  recent 05/01/2021. CHF, CAD, COPD, Signs/Symptoms:Chest Pain;                 Risk Factors:Hypertension and Dyslipidemia. 05/01/2021                 Pericardiocentensis.  Sonographer:    Luisa Hart RDCS Referring Phys: [id number] PETER M [place] IMPRESSIONS  1. Left ventricular ejection fraction, by estimation, is 55 to 60%. The left ventricle has normal function.  2. The moderate pericardial effusion has been tapped . The remaining pericardial effusion is now trivial.  3. The mitral valve is grossly normal. Trivial mitral valve regurgitation. FINDINGS  Left Ventricle: Left ventricular ejection fraction, by estimation, is 55 to 60%. The left ventricle has normal function. Pericardium: The moderate pericardial effusion  has been tapped . The remaining pericardial effusion is now trivial. Trivial pericardial effusion is present. Mitral Valve: The mitral valve is grossly normal. Trivial mitral valve regurgitation. Aortic Valve: Aortic valve mean gradient measures 5.0 mmHg. Aortic valve peak gradient measures 9.1 mmHg.  Diastology LV e' medial:  8.92 cm/s LV e' lateral: 9.25 cm/s  RIGHT VENTRICLE RV Basal diam:  3.00 cm RV Mid diam:    2.60 cm RV S prime:     13.30 cm/s TAPSE (M-mode): 1.2 cm LEFT ATRIUM           Index        RIGHT ATRIUM           Index LA Vol (A4C): 48.2 ml 25.01 ml/m  RA Area:     10.70 cm                                    RA Volume:   21.40 ml  11.10 ml/m  AORTIC VALVE AV Vmax:           151.00 cm/s AV Vmean:          103.000 cm/s AV VTI:            0.234 m AV Peak Grad:      9.1 mmHg AV Mean Grad:      5.0 mmHg LVOT Vmax:         127.00 cm/s LVOT Vmean:        81.700 cm/s LVOT VTI:          0.203 m LVOT/AV VTI ratio: 0.87 TRICUSPID VALVE TR Peak grad:   37.9 mmHg TR Vmax:        308.00 cm/s  SHUNTS Systemic VTI: 0.20 m Mertie Moores MD Electronically signed by Mertie Moores MD Signature Date/Time: 05/02/2021/1:47:55 PM    Final    ECHOCARDIOGRAM LIMITED  Result Date: 05/01/2021    ECHOCARDIOGRAM LIMITED REPORT   Patient Name:   DANARIUS MCCONATHY Date of Exam: 05/01/2021 Medical Rec #:  [id number]             Height:       64.0 in Accession #:    [id number]            Weight:       207.2 lb Date of Birth:  22-Sep-1965             BSA:          1.986 m Patient Age:    79 years              BP:  116/75 mmHg Patient Gender: M                     HR:           110 bpm. Exam Location:  Inpatient Procedure: Limited Echo Indications:    Pericardial effusion and pericardiocentesis.  History:        Patient has prior history of Echocardiogram examinations, most                 recent 04/30/2021. CAD, COPD; Risk Factors:Hypertension and                 Dyslipidemia.  Sonographer:    Clayton Lefort RDCS (AE)  Referring Phys: [id number] PETER M [place] IMPRESSIONS  1. Left ventricular ejection fraction, by estimation, is 55 to 60%. The left ventricle has normal function. The left ventricle demonstrates regional wall motion abnormalities abnormal septal motion.  2. Right ventricular systolic function is normal. The right ventricular size is normal.  3. Moderate pericardial effusion. The pericardial effusion is lateral to the left ventricle.  4. The mitral valve is normal in structure. No evidence of mitral valve regurgitation.  5. Compared to prior effusion size has improved. While there is no RV or RA collapse, there is a persistent pericardial effusion, localized lateral to the LV. Patient has tachycardia. The IVC was not assessed. Comparison(s): Persistent pericardial effusion. FINDINGS  Left Ventricle: Left ventricular ejection fraction, by estimation, is 55 to 60%. The left ventricle has normal function. The left ventricle demonstrates regional wall motion abnormalities. Right Ventricle: The right ventricular size is normal. Right ventricular systolic function is normal. Pericardium: A moderately sized pericardial effusion is present. The pericardial effusion is lateral to the left ventricle. Mitral Valve: The mitral valve is normal in structure. Tricuspid Valve: The tricuspid valve is normal in structure. Rudean Haskell MD Electronically signed by Rudean Haskell MD Signature Date/Time: 05/01/2021/1:51:12 PM    Final        LOS: 20 days   Bonnielee Haff  [facility] Pager on [email or web address]  05/03/2021, 9:10 AM

## 2021-05-03 NOTE — Progress Notes (Signed)
Nutrition Follow-up  DOCUMENTATION CODES:   Severe malnutrition in context of acute illness/injury  INTERVENTION:   Continue Ensure Enlive po BID, each supplement provides 350 kcal and 20 grams of protein.  D/C Pro-Source  Continue MVI with Minerals  Liberalize diet to REGULAR  NUTRITION DIAGNOSIS:   Severe Malnutrition related to acute illness (pancreatitis) as evidenced by moderate muscle depletion, moderate fat depletion, energy intake < or equal to 50% for > or equal to 5 days, percent weight loss (11% weight loss within 3 months).  Being addressed via supplements, appetite improved  GOAL:   Patient will meet greater than or equal to 90% of their needs  Progressing  MONITOR:   PO intake, Supplement acceptance, Labs, Weight trends  REASON FOR ASSESSMENT:   New TF    ASSESSMENT:   56 yo male admitted with severe acute pancreatitis with sepsis, AKI. PMH includes CAD, HTN, PE, HLD, CVA, IDA, pancreatitis, chronic respiratory failure.  2/03 - Cortrak placed then advanced by fluoroscopy into the distal duodenum/proximal jejunum 2/06 - IR consulted for percutaneous drainage 2/07 - diet advanced to full liquids  2/10 - diet advanced to Heart Healthy 2/16 - CT scan of the abdomen pelvis was repeated, showed stable findings  2/17 - Cortrak removed, TF discontinued 2/19 - Pericardiocentesis with 725 mL removed,  drain placement  Pt sitting up in chair, eating lunch on visit today  Appetite very good, pt eating well at meal times. Recorded po intake 50-100% of meals.  Pt taking some Ensure but does not like very much. Pt is not taking Pro-Source Plus as he reports he does not like. Pt agreeable to continuing Ensure and drinking as much as he can with discontinuation of Pro-Source  Labs:  sodium 129 (L), potassium 3.3 (L), Creatinine wdl Meds: MVI with Minerals, lasix and KCl x 1, Vit B12   Diet Order:   Diet Order             Diet heart healthy/carb modified Room  service appropriate? Yes; Fluid consistency: Thin  Diet effective now                   EDUCATION NEEDS:   Education needs have been addressed  Skin:  Skin Assessment: Reviewed RN Assessment  Last BM:  2/21  Height:   Ht Readings from Last 1 Encounters:  04/19/21 5\' 4"  (1.626 m)    Weight:   Wt Readings from Last 1 Encounters:  05/02/21 87.6 kg    BMI:  Body mass index is 33.15 kg/m.  Estimated Nutritional Needs:   Kcal:  2300-2500  Protein:  120 gm  Fluid:  2.3-2.5 L  Kerman Passey MS, RDN, LDN, CNSC Registered Dietitian III Clinical Nutrition RD Pager and On-Call Pager Number Located in [place]

## 2021-05-04 DIAGNOSIS — I3139 Other pericardial effusion (noninflammatory): Secondary | ICD-10-CM | POA: Diagnosis not present

## 2021-05-04 DIAGNOSIS — I314 Cardiac tamponade: Secondary | ICD-10-CM | POA: Diagnosis not present

## 2021-05-04 DIAGNOSIS — K8591 Acute pancreatitis with uninfected necrosis, unspecified: Secondary | ICD-10-CM | POA: Diagnosis not present

## 2021-05-04 DIAGNOSIS — J9611 Chronic respiratory failure with hypoxia: Secondary | ICD-10-CM | POA: Diagnosis not present

## 2021-05-04 DIAGNOSIS — N179 Acute kidney failure, unspecified: Secondary | ICD-10-CM | POA: Diagnosis not present

## 2021-05-04 DIAGNOSIS — L0291 Cutaneous abscess, unspecified: Secondary | ICD-10-CM | POA: Diagnosis not present

## 2021-05-04 DIAGNOSIS — I10 Essential (primary) hypertension: Secondary | ICD-10-CM | POA: Diagnosis not present

## 2021-05-04 LAB — CBC
HCT: 27.9 % — ABNORMAL LOW (ref 39.0–52.0)
Hemoglobin: 9.4 g/dL — ABNORMAL LOW (ref 13.0–17.0)
MCH: 28.4 pg (ref 26.0–34.0)
MCHC: 33.7 g/dL (ref 30.0–36.0)
MCV: 84.3 fL (ref 80.0–100.0)
Platelets: 354 10*3/uL (ref 150–400)
RBC: 3.31 MIL/uL — ABNORMAL LOW (ref 4.22–5.81)
RDW: 16.4 % — ABNORMAL HIGH (ref 11.5–15.5)
WBC: 16.3 10*3/uL — ABNORMAL HIGH (ref 4.0–10.5)
nRBC: 0 % (ref 0.0–0.2)

## 2021-05-04 LAB — BASIC METABOLIC PANEL
Anion gap: 9 (ref 5–15)
BUN: 26 mg/dL — ABNORMAL HIGH (ref 6–20)
CO2: 24 mmol/L (ref 22–32)
Calcium: 8.6 mg/dL — ABNORMAL LOW (ref 8.9–10.3)
Chloride: 98 mmol/L (ref 98–111)
Creatinine, Ser: 0.94 mg/dL (ref 0.61–1.24)
GFR, Estimated: >60 mL/min (ref >60)
Glucose, Bld: 96 mg/dL (ref 70–99)
Potassium: 3.6 mmol/L (ref 3.5–5.1)
Sodium: 131 mmol/L — ABNORMAL LOW (ref 135–145)

## 2021-05-04 LAB — CYTOLOGY - NON PAP

## 2021-05-04 LAB — GLUCOSE, CAPILLARY
Glucose-Capillary: 97 mg/dL (ref 70–99)
Glucose-Capillary: 124 mg/dL — ABNORMAL HIGH (ref 70–99)
Glucose-Capillary: 102 mg/dL — ABNORMAL HIGH (ref 70–99)

## 2021-05-04 LAB — C-REACTIVE PROTEIN: CRP: 13 mg/dL — ABNORMAL HIGH

## 2021-05-04 LAB — MAGNESIUM: Magnesium: 1.7 mg/dL (ref 1.7–2.4)

## 2021-05-04 LAB — CK: Total CK: 7 U/L — ABNORMAL LOW (ref 49–397)

## 2021-05-04 NOTE — Progress Notes (Signed)
[facility]  Date of Admission:  04/13/2021           Reason for visit: Follow up on necrotizing pancreatitis, pericardial effusion  Current antibiotics: Meropenem Daptomycin  ASSESSMENT:    56 y.o. male admitted with:  Necrotizing pancreatitis with abscess formation: Status post IR drain placement 04/18/2021.  Cultures isolated Klebsiella pneumonia and Citrobacter fruendii.  He is currently on meropenem with 24-hour drain output measured as approximately 10 cc.  Last CT was done on 2/16 with decreasing size of fluid collections. Pericardial effusion and tamponade: Status post pericardiocentesis and pericardial drain 2/19 with drain removal today.  Pericardial effusion was thought to be from chronic continuous inflammation and altered lymphatic drainage due to pancreatitis as well as possible bleeding into the pericardial space given his anticoagulation.  RECOMMENDATIONS:    Continue meropenem Continue daptomycin Monitor IR drain output Lab monitoring We will follow   Principal Problem:   Necrotizing pancreatitis Active Problems:   Coronary artery disease with stable angina pectoris (HCC)   Hyperlipemia   Chronic obstructive pulmonary disease (HCC)   Essential hypertension   History of pulmonary embolism   Sepsis ([place])   AKI (acute kidney injury) ([place])   Hypokalemia   Chronic respiratory failure with hypoxia (HCC)   Normocytic anemia   Hypoglycemia   Protein-calorie malnutrition, severe   Pericardial effusion   Pericardial tamponade    MEDICATIONS:    Scheduled Meds:  Chlorhexidine Gluconate Cloth  6 each Topical Daily   feeding supplement  237 mL Oral BID BM   fluticasone furoate-vilanterol  1 puff Inhalation Daily   And   umeclidinium bromide  1 puff Inhalation Daily   metoprolol tartrate  25 mg Oral BID   multivitamin with minerals  1 tablet Oral Daily   mupirocin ointment  1 application Nasal BID   pantoprazole  40 mg Oral  BID   sodium chloride flush  5 mL Intracatheter Q8H   vitamin B-12  1,000 mcg Per Tube Daily   Continuous Infusions:  sodium chloride     DAPTOmycin (CUBICIN)  IV Stopped (05/03/21 1411)   meropenem (MERREM) IV 200 mL/hr at 05/04/21 0800   PRN Meds:.acetaminophen, albuterol, morphine injection, ondansetron **OR** ondansetron (ZOFRAN) IV, oxyCODONE  SUBJECTIVE:   24 hour events:  No acute events noted overnight Status post pericardial drain removal this morning Afebrile WBC relatively stable Creatinine 0.9 No new imaging Pericardial fluid cultures grew VRE Abdominal fluid collection to drain output measured as 10 cc over the last 24 hours.  Patient reports no new complaints today.  He walked some of the hallways earlier with PT.  Denies shortness of breath, fevers.  Tolerating p.o. intake.  Review of Systems  All other systems reviewed and are negative.    OBJECTIVE:   Blood pressure (!) 134/94, pulse (!) 111, temperature 98.2 F (36.8 C), temperature source Oral, resp. rate (!) 26, height 5\' 4"  (1.626 m), weight 82.8 kg, SpO2 93 %. Body mass index is 31.33 kg/m.  Physical Exam Constitutional:      General: He is not in acute distress.    Comments: [name], sitting up in the chair, wife at the bedside, watching TV.  HENT:     Head: Normocephalic and atraumatic.  Eyes:     Extraocular Movements: Extraocular movements intact.     Conjunctiva/sclera: Conjunctivae normal.  Pulmonary:     Effort: Pulmonary effort is normal.  Abdominal:     General:  There is no distension.     Palpations: Abdomen is soft.  Musculoskeletal:        General: Normal range of motion.     Cervical back: Normal range of motion and neck supple.  Skin:    General: Skin is warm and dry.     Findings: No rash.  Neurological:     General: No focal deficit present.     Mental Status: He is alert and oriented to person, place, and time.  Psychiatric:        Mood and  Affect: Mood normal.        Behavior: Behavior normal.     Lab Results: Lab Results  Component Value Date   WBC 16.3 (H) 05/04/2021   HGB 9.4 (L) 05/04/2021   HCT 27.9 (L) 05/04/2021   MCV 84.3 05/04/2021   PLT 354 05/04/2021    Lab Results  Component Value Date   NA 131 (L) 05/04/2021   K 3.6 05/04/2021   CO2 24 05/04/2021   GLUCOSE 96 05/04/2021   BUN 26 (H) 05/04/2021   CREATININE 0.94 05/04/2021   CALCIUM 8.6 (L) 05/04/2021   GFRNONAA >60 05/04/2021   GFRAA 105 12/15/2019    Lab Results  Component Value Date   ALT 17 04/18/2021   AST 24 04/18/2021   ALKPHOS 77 04/18/2021   BILITOT 0.4 04/18/2021       Component Value Date/Time   CRP 17.6 (H) 05/02/2021 1004    No results found for: ESRSEDRATE   I have reviewed the micro and lab results in Epic.  Imaging: No results found.   Imaging independently reviewed in Epic.    [facility] for Infectious Disease [facility] [phone] pager 05/04/2021, 11:29 AM

## 2021-05-04 NOTE — Progress Notes (Signed)
Referring Physician(s): Bonnielee Haff  Supervising Physician: Mir, Sharen Heck  Patient Status:  Scott Foley - In-pt  Chief Complaint:  Pancreatic pseudocyst s/p RLQ drain placement in IR 04/18/21.  Of note, CT AP W on 2/16 showed moderate pericardial effusion, pt is s/p pericardiocentesis with drain placement on 2/19, the drain was removed on 2/22.   Subjective:  Pt sitting in chair, NAD. Reports he is doing better than yesterday.  Has no concern about the RUQ drain, states that it is working fine.   Allergies: Penicillin g  Medications: Prior to Admission medications   Medication Sig Start Date End Date Taking? Authorizing Provider  albuterol (VENTOLIN HFA) 108 (90 Base) MCG/ACT inhaler Inhale 2 puffs into the lungs every 6 (six) hours as needed for wheezing or shortness of breath. 04/04/21  Yes Rip Harbour, NP  apixaban (ELIQUIS) 5 MG TABS tablet Take 1 tablet (5 mg total) by mouth 2 (two) times daily. 04/01/21  Yes Rip Harbour, NP  clopidogrel (PLAVIX) 75 MG tablet Take 1 tablet (75 mg total) by mouth daily. 04/01/21  Yes Rip Harbour, NP  Evolocumab (REPATHA) 140 MG/ML SOSY Inject 140 mg into the skin every 14 (fourteen) days. 12/30/20  Yes [name], [name], MD  Fluticasone-Umeclidin-Vilant (TRELEGY ELLIPTA) 100-62.5-25 MCG/ACT AEPB Inhale 1 puff into the lungs daily. 04/04/21  Yes Rip Harbour, NP  Ibuprofen-diphenhydrAMINE Cit (ADVIL PM PO) Take 1 tablet by mouth at bedtime.   Yes [provider]  isosorbide mononitrate (IMDUR) 60 MG 24 hr tablet Take 1 tablet (60 mg total) by mouth daily. 04/01/21  Yes Rip Harbour, NP  lisinopril (ZESTRIL) 40 MG tablet Take 1 tablet (40 mg total) by mouth daily. 04/01/21  Yes Rip Harbour, NP  metoprolol tartrate (LOPRESSOR) 50 MG tablet Take 1 tablet (50 mg total) by mouth 2 (two) times daily. 04/01/21  Yes Rip Harbour, NP  Omega-3 Fatty Acids (FISH OIL) 1000 MG CAPS Take 1 capsule (1,000 mg total) by mouth 2  (two) times daily. 11/30/20  Yes [name], [name], MD  pantoprazole (PROTONIX) 40 MG tablet Take 1 tablet (40 mg total) by mouth daily. Patient taking differently: Take 40 mg by mouth 2 (two) times daily. 02/15/21  Yes Rip Harbour, NP  rosuvastatin (CRESTOR) 40 MG tablet Take 1 tablet (40 mg total) by mouth daily. Patient taking differently: Take 40 mg by mouth at bedtime. 04/01/21  Yes Rip Harbour, NP     Vital Signs: BP (!) 134/94    Pulse (!) 120    Temp 98.2 F (36.8 C) (Oral)    Resp (!) 23    Ht 5\' 4"  (1.626 m)    Wt 182 lb 8.7 oz (82.8 kg)    SpO2 94%    BMI 31.33 kg/m   Physical Exam Vitals reviewed.  Constitutional:      Appearance: He is well-developed.  HENT:     Head: Normocephalic and atraumatic.  Pulmonary:     Effort: Pulmonary effort is normal.  Abdominal:     General: Abdomen is flat.  Skin:    General: Skin is warm and dry.     Comments: Positive RUQ drain to gravity bag. Site is unremarkable with no erythema, edema, tenderness, bleeding or drainage. Suture and stat lock in place. Dressing is clean, dry, and intact. Trace of  cream colored fluid noted in the bag. Drain aspirates and flushes well.    Neurological:     Mental Status: He is  alert and oriented to person, place, and time.  Psychiatric:        Mood and Affect: Mood normal.        Behavior: Behavior normal.    Imaging: CARDIAC CATHETERIZATION  Result Date: 05/01/2021 Successful pericardiocentesis from an intercostal approach with removal of 600 cc of hemorrhagic fluid. Plan: will leave drain in place for now. Check portable CXR. Fluid sent for analysis. Will hold IV heparin.   DG CHEST PORT 1 VIEW  Result Date: 05/01/2021 CLINICAL DATA:  Pericardial effusion EXAM: PORTABLE CHEST 1 VIEW COMPARISON:  05/01/2021, 9:51 a.m. FINDINGS: Unchanged cardiomegaly. Diffuse bilateral interstitial pulmonary opacity. Small, layering bilateral pleural effusions. The visualized skeletal structures are  unremarkable. IMPRESSION: 1. Unchanged AP portable examination. Cardiomegaly in keeping with known pericardial effusion. 2. Diffuse bilateral interstitial pulmonary opacity and small, layering bilateral pleural effusions, consistent with edema. No new airspace opacity. Electronically Signed   By: Delanna Ahmadi M.D.   On: 05/01/2021 17:00   DG CHEST PORT 1 VIEW  Result Date: 05/01/2021 CLINICAL DATA:  Dyspnea R06.00 (ICD-10-CM) EXAM: PORTABLE CHEST 1 VIEW COMPARISON:  February 1, 23. FINDINGS: Low lung volumes. New retrocardiac opacity.Otherwise, similar patchy mild opacities in bilateral mid basilar lungs. Question nodular opacity in the right midlung. No definite pleural effusions or pneumothorax. Cardiomediastinal silhouette is mildly enlarged, likely accentuated by low lung volumes and AP technique. IMPRESSION: 1. Low lung volumes with new retrocardiac opacity, which could represent atelectasis, aspiration, and/or pneumonia. Otherwise, similar patchy mild opacities in bilateral mid basilar lungs, suspicious for pneumonia. 2. Question nodular opacity in the right midlung. Recommend CT chest to further evaluate for pulmonary nodule/malignancy. Electronically Signed   By: Margaretha Sheffield M.D.   On: 05/01/2021 11:12   ECHOCARDIOGRAM LIMITED  Result Date: 05/02/2021    ECHOCARDIOGRAM LIMITED REPORT   Patient Name:   Scott Foley Date of Exam: 05/02/2021 Medical Rec #:  [id number]             Height:       64.0 in Accession #:    [id number]            Weight:       193.1 lb Date of Birth:  [birth year]             BSA:          1.927 m Patient Age:    56 years              BP:           124/81 mmHg Patient Gender: M                     HR:           99 bpm. Exam Location:  Inpatient Procedure: Limited Echo, Cardiac Doppler and Color Doppler Indications:    Follow up pericardial effusion  History:        Patient has prior history of Echocardiogram examinations, most                 recent 05/01/2021. CHF,  CAD, COPD, Signs/Symptoms:Chest Pain;                 Risk Factors:Hypertension and Dyslipidemia. 05/01/2021                 Pericardiocentensis.  Sonographer:    Luisa Hart RDCS Referring Phys: [id number] PETER M [place] IMPRESSIONS  1. Left ventricular ejection fraction, by estimation, is 55 to 60%.  The left ventricle has normal function.  2. The moderate pericardial effusion has been tapped . The remaining pericardial effusion is now trivial.  3. The mitral valve is grossly normal. Trivial mitral valve regurgitation. FINDINGS  Left Ventricle: Left ventricular ejection fraction, by estimation, is 55 to 60%. The left ventricle has normal function. Pericardium: The moderate pericardial effusion has been tapped . The remaining pericardial effusion is now trivial. Trivial pericardial effusion is present. Mitral Valve: The mitral valve is grossly normal. Trivial mitral valve regurgitation. Aortic Valve: Aortic valve mean gradient measures 5.0 mmHg. Aortic valve peak gradient measures 9.1 mmHg.  Diastology LV e' medial:  8.92 cm/s LV e' lateral: 9.25 cm/s  RIGHT VENTRICLE RV Basal diam:  3.00 cm RV Mid diam:    2.60 cm RV S prime:     13.30 cm/s TAPSE (M-mode): 1.2 cm LEFT ATRIUM           Index        RIGHT ATRIUM           Index LA Vol (A4C): 48.2 ml 25.01 ml/m  RA Area:     10.70 cm                                    RA Volume:   21.40 ml  11.10 ml/m  AORTIC VALVE AV Vmax:           151.00 cm/s AV Vmean:          103.000 cm/s AV VTI:            0.234 m AV Peak Grad:      9.1 mmHg AV Mean Grad:      5.0 mmHg LVOT Vmax:         127.00 cm/s LVOT Vmean:        81.700 cm/s LVOT VTI:          0.203 m LVOT/AV VTI ratio: 0.87 TRICUSPID VALVE TR Peak grad:   37.9 mmHg TR Vmax:        308.00 cm/s  SHUNTS Systemic VTI: 0.20 m Mertie Moores MD Electronically signed by Mertie Moores MD Signature Date/Time: 05/02/2021/1:47:55 PM    Final    ECHOCARDIOGRAM LIMITED  Result Date: 05/01/2021    ECHOCARDIOGRAM LIMITED REPORT   Patient  Name:   Scott Foley Date of Exam: 05/01/2021 Medical Rec #:  [id number]             Height:       64.0 in Accession #:    [id number]            Weight:       207.2 lb Date of Birth:  April 22, 1965             BSA:          1.986 m Patient Age:    84 years              BP:           116/75 mmHg Patient Gender: M                     HR:           110 bpm. Exam Location:  Inpatient Procedure: Limited Echo Indications:    Pericardial effusion and pericardiocentesis.  History:        Patient has prior history of Echocardiogram examinations, most  recent 04/30/2021. CAD, COPD; Risk Factors:Hypertension and                 Dyslipidemia.  Sonographer:    Clayton Lefort RDCS (AE) Referring Phys: [id number] PETER M [place] IMPRESSIONS  1. Left ventricular ejection fraction, by estimation, is 55 to 60%. The left ventricle has normal function. The left ventricle demonstrates regional wall motion abnormalities abnormal septal motion.  2. Right ventricular systolic function is normal. The right ventricular size is normal.  3. Moderate pericardial effusion. The pericardial effusion is lateral to the left ventricle.  4. The mitral valve is normal in structure. No evidence of mitral valve regurgitation.  5. Compared to prior effusion size has improved. While there is no RV or RA collapse, there is a persistent pericardial effusion, localized lateral to the LV. Patient has tachycardia. The IVC was not assessed. Comparison(s): Persistent pericardial effusion. FINDINGS  Left Ventricle: Left ventricular ejection fraction, by estimation, is 55 to 60%. The left ventricle has normal function. The left ventricle demonstrates regional wall motion abnormalities. Right Ventricle: The right ventricular size is normal. Right ventricular systolic function is normal. Pericardium: A moderately sized pericardial effusion is present. The pericardial effusion is lateral to the left ventricle. Mitral Valve: The mitral valve is normal in  structure. Tricuspid Valve: The tricuspid valve is normal in structure. Rudean Haskell MD Electronically signed by Rudean Haskell MD Signature Date/Time: 05/01/2021/1:51:12 PM    Final    ECHOCARDIOGRAM LIMITED  Result Date: 04/30/2021    ECHOCARDIOGRAM LIMITED REPORT   Patient Name:   Scott Foley Date of Exam: 04/30/2021 Medical Rec #:  [id number]             Height:       64.0 in Accession #:    [id number]            Weight:       207.2 lb Date of Birth:  09/23/1965             BSA:          1.986 m Patient Age:    54 years              BP:           101/72 mmHg Patient Gender: M                     HR:           106 bpm. Exam Location:  Inpatient Procedure: Limited Echo, Cardiac Doppler and Color Doppler Indications:    Pericardial effusion  History:        Patient has prior history of Echocardiogram examinations, most                 recent 04/28/2021. CAD, COPD; Risk Factors:Hypertension and                 Dyslipidemia.  Sonographer:    Clayton Lefort RDCS (AE) Referring Phys: [place]  1. Respiratory variation is 34% across the MV. The RA is not well visualized. There is evidence of early RV diastolic collapse. Similar findings of tamponade physiology to 04/28/2021 echo. Chart reviewed, hemodynamics stable since last echo. Appears plan  has been close inpatient monitoring, potential pericardiocentesis if becomes clinically indicated. . Large pericardial effusion. The pericardial effusion is circumferential.  2. The inferior vena cava is dilated in size with <50% respiratory variability, suggesting right atrial pressure of 15 mmHg.  3. Limited  echo to evaluate pericardial effusion FINDINGS  Right Atrium: Right atrial size was not well visualized. Pericardium: Respiratory variation is 34% across the MV. The RA is not well visualized. There is evidence of early RV diastolic collapse. Similar findings of tamponade physiology to 04/28/2021 echo. Chart reviewed, hemodynamics  stable since last echo. Appears plan has been close inpatient monitoring, potential pericardiocentesis if becomes clinically indicated. A large pericardial effusion is present. The pericardial effusion is circumferential. Venous: The inferior vena cava is dilated in size with less than 50% respiratory variability, suggesting right atrial pressure of 15 mmHg. IVC IVC diam: 2.20 cm Carlyle Dolly MD Electronically signed by Carlyle Dolly MD Signature Date/Time: 04/30/2021/6:05:11 PM    Final     Labs:  CBC: Recent Labs    05/01/21 0220 05/02/21 0120 05/02/21 1610 05/03/21 0007 05/04/21 0232  WBC 15.1* 13.0*  --  15.7* 16.3*  HGB 7.8* 7.1* 10.0* 9.9* 9.4*  HCT 24.2* 22.3* 28.7* 28.8* 27.9*  PLT 461* 383  --  376 354     COAGS: Recent Labs    04/13/21 1523 04/14/21 0230 04/15/21 0153 04/15/21 0909 04/15/21 2008 04/16/21 0649 04/17/21 0341  INR 2.4*  --  1.6*  --   --  1.4* 1.5*  APTT 52*   < >  --  62* 69* 74* 65*   < > = values in this interval not displayed.     BMP: Recent Labs    05/01/21 0220 05/02/21 0120 05/03/21 0007 05/04/21 0232  NA 130* 131* 129* 131*  K 5.0 3.9 3.3* 3.6  CL 97* 99 95* 98  CO2 19* 21* 25 24  GLUCOSE 110* 90 95 96  BUN 37* 41* 27* 26*  CALCIUM 9.5 8.6* 8.7* 8.6*  CREATININE 1.17 1.11 0.98 0.94  GFRNONAA >60 >60 >60 >60     LIVER FUNCTION TESTS: Recent Labs    04/15/21 0153 04/16/21 0649 04/17/21 0637 04/18/21 0251  BILITOT 0.6 0.3 0.2* 0.4  AST 17 18 16 24   ALT 18 17 15 17   ALKPHOS 98 92 67 77  PROT 5.6* 5.8* 5.3* 4.8*  ALBUMIN 1.7* 1.7* 1.5* <1.5*     Assessment and Plan:  56 y.o. male with pancreatic pseudocyst s/p RLQ drain placement in IR 04/18/21  with Dr. Pascal Lux Afebrile, WBC fluctuating 16.3 Today (15.7 yesterday, 13.0 2 days ago)   - ID following, on meropenem and daptomycin   Drain Location: RLQ Size: Fr size: 12 Fr Date of placement: 2/6   Currently to: Drain collection device: gravity 24 hour output:   Output by Drain (mL) 05/02/21 0701 - 05/02/21 1900 05/02/21 1901 - 05/03/21 0700 05/03/21 0701 - 05/03/21 1900 05/03/21 1901 - 05/04/21 0700 05/04/21 0701 - 05/04/21 1450  Closed System Drain 1 Right RLQ Other (Comment) 12 Fr. 30 10  10      Interval imaging/drain manipulation:  2/16 CT A/P with:     Evolving changes of necrotizing pancreatitis. Stable enhancement of the residual pancreatic parenchyma. Increasing organization peripancreatic necrosis adjacent to the head and body of the pancreas with areas of more formal walled-off necrosis adjacent to The pancreatic head demonstrating slight interval decrease in size since prior examination. Intraluminal gas within areas of walled-off necrosis are again identified and superinfection is not excluded.   Interval percutaneous drainage of area retroperitoneal walled-off necrosis within the right iliac fossa with interval decrease in size of loculated collection.   Current examination: Flushes/aspirates easily.  Insertion site unremarkable. Suture and stat lock in place.  Dressed appropriately.   Plan: Continue TID flushes with 5 cc NS. Record output Q shift. Dressing changes QD or PRN if soiled.   OP dropped to 10 mL yesterday (40 mL on 2/20.) Will consider f/u CT AP with if OP remains under 10 mL overnight.    IR will continue to follow - please call with questions or concerns.   Electronically Signed: Tera Mater, PA-C 05/04/2021, 2:50 PM   I spent a total of 15 Minutes at the the patient's bedside AND on the patient's Foley floor or unit, greater than 50% of which was counseling/coordinating care for RLQ drain.  This chart was dictated using voice recognition software.  Despite best efforts to proofread,  errors can occur which can change the documentation meaning.

## 2021-05-04 NOTE — Progress Notes (Signed)
Cardiology Progress Note  Patient ID: Duward Allbritton MRN: [id number] DOB: March 02, 1966 Date of Encounter: 05/04/2021  Primary Cardiologist: None  Subjective   Chief Complaint: Weakness  HPI: no issues overnight. Removed pericardial drain today  ROS:  All other ROS reviewed and negative. Pertinent positives noted in the HPI.     Inpatient Medications  Scheduled Meds:  Chlorhexidine Gluconate Cloth  6 each Topical Daily   feeding supplement  237 mL Oral BID BM   fluticasone furoate-vilanterol  1 puff Inhalation Daily   And   umeclidinium bromide  1 puff Inhalation Daily   metoprolol tartrate  25 mg Oral BID   multivitamin with minerals  1 tablet Oral Daily   mupirocin ointment  1 application Nasal BID   pantoprazole  40 mg Oral BID   sodium chloride flush  5 mL Intracatheter Q8H   vitamin B-12  1,000 mcg Per Tube Daily   Continuous Infusions:  sodium chloride     DAPTOmycin (CUBICIN)  IV Stopped (05/03/21 1411)   meropenem (MERREM) IV 1 g (05/04/21 0748)   PRN Meds: acetaminophen, albuterol, morphine injection, ondansetron **OR** ondansetron (ZOFRAN) IV, oxyCODONE   Vital Signs   Vitals:   05/04/21 0400 05/04/21 0500 05/04/21 0600 05/04/21 0640  BP: 124/90 121/87 126/80   Pulse: (!) 102 99 (!) 104   Resp: 19 18 (!) 25   Temp: 98.4 F (36.9 C)   97.8 F (36.6 C)  TempSrc: Oral   Oral  SpO2: 91% 94% 91%   Weight:  82.8 kg    Height:        Intake/Output Summary (Last 24 hours) at 05/04/2021 0821 Last data filed at 05/04/2021 0700 Gross per 24 hour  Intake 369.07 ml  Output 430 ml  Net -60.93 ml   Last 3 Weights 05/04/2021 05/02/2021 04/25/2021  Weight (lbs) 182 lb 8.7 oz 193 lb 2 oz 207 lb 3.7 oz  Weight (kg) 82.8 kg 87.6 kg 94 kg      Telemetry  Sinus tachycardia  Physical Exam   Vitals:   05/04/21 0400 05/04/21 0500 05/04/21 0600 05/04/21 0640  BP: 124/90 121/87 126/80   Pulse: (!) 102 99 (!) 104   Resp: 19 18 (!) 25   Temp: 98.4 F (36.9 C)    97.8 F (36.6 C)  TempSrc: Oral   Oral  SpO2: 91% 94% 91%   Weight:  82.8 kg    Height:        Intake/Output Summary (Last 24 hours) at 05/04/2021 0821 Last data filed at 05/04/2021 0700 Gross per 24 hour  Intake 369.07 ml  Output 430 ml  Net -60.93 ml    Last 3 Weights 05/04/2021 05/02/2021 04/25/2021  Weight (lbs) 182 lb 8.7 oz 193 lb 2 oz 207 lb 3.7 oz  Weight (kg) 82.8 kg 87.6 kg 94 kg    Body mass index is 31.33 kg/m.   Physical Exam Gen: cachectic, on acute distress. Sitting in a chair Neuro: alert and oriented CV: tachycardic no murmurs.  Drain site clean and dry Pulm: nl wob, decreased lung sounds in the bases Abd: non distended Ext: No LE edema Skin: warm and well perfused Psych: normal mood  Pericardial Drain- minimal serosanguinous fluid.   Labs  High Sensitivity Troponin:  No results for input(s): TROPONINIHS in the last 720 hours.   Cardiac EnzymesNo results for input(s): TROPONINI in the last 168 hours. No results for input(s): TROPIPOC in the last 168 hours.  Chemistry Recent Labs  Lab 05/02/21 0120 05/03/21 0007 05/04/21 0232  NA 131* 129* 131*  K 3.9 3.3* 3.6  CL 99 95* 98  CO2 21* 25 24  GLUCOSE 90 95 96  BUN 41* 27* 26*  CREATININE 1.11 0.98 0.94  CALCIUM 8.6* 8.7* 8.6*  GFRNONAA >60 >60 >60  ANIONGAP 11 9 9     Hematology Recent Labs  Lab 05/02/21 0120 05/02/21 1610 05/03/21 0007 05/04/21 0232  WBC 13.0*  --  15.7* 16.3*  RBC 2.59*  --  3.42* 3.31*  HGB 7.1* 10.0* 9.9* 9.4*  HCT 22.3* 28.7* 28.8* 27.9*  MCV 86.1  --  84.2 84.3  MCH 27.4  --  28.9 28.4  MCHC 31.8  --  34.4 33.7  RDW 17.4*  --  16.3* 16.4*  PLT 383  --  376 354   BNPNo results for input(s): BNP, PROBNP in the last 168 hours.  DDimer No results for input(s): DDIMER in the last 168 hours.   Radiology  ECHOCARDIOGRAM LIMITED  Result Date: 05/02/2021    ECHOCARDIOGRAM LIMITED REPORT   Patient Name:   KWAN SHELLHAMMER Date of Exam: 05/02/2021 Medical Rec #:   [id number]             Height:       64.0 in Accession #:    [id number]            Weight:       193.1 lb Date of Birth:  Jul 15, 1965             BSA:          1.927 m Patient Age:    56 years              BP:           124/81 mmHg Patient Gender: M                     HR:           99 bpm. Exam Location:  Inpatient Procedure: Limited Echo, Cardiac Doppler and Color Doppler Indications:    Follow up pericardial effusion  History:        Patient has prior history of Echocardiogram examinations, most                 recent 05/01/2021. CHF, CAD, COPD, Signs/Symptoms:Chest Pain;                 Risk Factors:Hypertension and Dyslipidemia. 05/01/2021                 Pericardiocentensis.  Sonographer:    Luisa Hart RDCS Referring Phys: [id number] PETER M [place] IMPRESSIONS  1. Left ventricular ejection fraction, by estimation, is 55 to 60%. The left ventricle has normal function.  2. The moderate pericardial effusion has been tapped . The remaining pericardial effusion is now trivial.  3. The mitral valve is grossly normal. Trivial mitral valve regurgitation. FINDINGS  Left Ventricle: Left ventricular ejection fraction, by estimation, is 55 to 60%. The left ventricle has normal function. Pericardium: The moderate pericardial effusion has been tapped . The remaining pericardial effusion is now trivial. Trivial pericardial effusion is present. Mitral Valve: The mitral valve is grossly normal. Trivial mitral valve regurgitation. Aortic Valve: Aortic valve mean gradient measures 5.0 mmHg. Aortic valve peak gradient measures 9.1 mmHg.  Diastology LV e' medial:  8.92 cm/s LV e' lateral: 9.25 cm/s  RIGHT VENTRICLE RV Basal diam:  3.00 cm RV Mid diam:  2.60 cm RV S prime:     13.30 cm/s TAPSE (M-mode): 1.2 cm LEFT ATRIUM           Index        RIGHT ATRIUM           Index LA Vol (A4C): 48.2 ml 25.01 ml/m  RA Area:     10.70 cm                                    RA Volume:   21.40 ml  11.10 ml/m  AORTIC VALVE AV Vmax:           151.00  cm/s AV Vmean:          103.000 cm/s AV VTI:            0.234 m AV Peak Grad:      9.1 mmHg AV Mean Grad:      5.0 mmHg LVOT Vmax:         127.00 cm/s LVOT Vmean:        81.700 cm/s LVOT VTI:          0.203 m LVOT/AV VTI ratio: 0.87 TRICUSPID VALVE TR Peak grad:   37.9 mmHg TR Vmax:        308.00 cm/s  SHUNTS Systemic VTI: 0.20 m Mertie Moores MD Electronically signed by Mertie Moores MD Signature Date/Time: 05/02/2021/1:47:55 PM    Final     Cardiac Studies  TTE 04/30/2021  1. Respiratory variation is 34% across the MV. The RA is not well  visualized. There is evidence of early RV diastolic collapse. Similar  findings of tamponade physiology to 04/28/2021 echo. Chart reviewed,  hemodynamics stable since last echo. Appears plan   has been close inpatient monitoring, potential pericardiocentesis if  becomes clinically indicated. . Large pericardial effusion. The  pericardial effusion is circumferential.   2. The inferior vena cava is dilated in size with <50% respiratory  variability, suggesting right atrial pressure of 15 mmHg.   3. Limited echo to evaluate pericardial effusion   Patient Profile  Wilbern Pennypacker is a 56 y.o. male with CAD, hypertension, PE on Eliquis, stroke, iron deficiency anemia admitted on 04/13/2021 with severe acute pancreatitis with sepsis.  Cardiology was consulted on 04/28/2021 for large pericardial effusion seen on CT scan.  Pericardiocentesis was delayed due to necrotizing pancreatitis.  Evaluation on 05/01/2021 shows worsening hypotension and tachycardia concerning for pericardial tamponade.  Currently admitted to the hospital with complications of necrotizing pancreatitis.  He developed pancreatitis post ERCP from a sending cholangitis in December 2023.  He has had a rather complicated course with recurrent pancreatitis as well as necrotizing pancreatitis requiring drainage recently.  He has had multiple aspirations of pancreatic fluid collections. CT scan on  04/28/2021 demonstrated large circumferential pericardial effusion.    Assessment & Plan   #Large pericardial effusion with pericardial tamponade #Hypotension #Cardiogenic shock - resolved, s/p pericardiocentesis and drain - off pressors  -if he develops pleuritic chest pain will consider tx for pericarditis - s/p drain placement 2/19 - repeat limited echo 2/20 showed trivial pericardial effusion, drain output consistently < 50 cc in 24 hours - removed pericardial drain today  #Sinus tachycardia: ddx includes dehydration/sepsis - fluids as needed - on antibiotics  #Necrotizing pancreatitis #Sepsis -Treatment per hospital medicine -on Abx  #Anemia -appears to be chronic -stable  #History of PE - continue to hold [facility]  until 2/24  #Stroke hx: - continue to hold plavix until 2/24   For questions or updates, please contact [place] Please consult [email or web address] for contact info under     Janina Mayo, MD

## 2021-05-04 NOTE — Progress Notes (Signed)
PROGRESS NOTE    Scott Foley  WUJ:[id number] DOB: 1965/04/07 DOA: 04/13/2021 PCP: [name], MD   Chief Complaint  Patient presents with   Abdominal Pain    Brief Narrative:    56 year old male with prior history of coronary artery disease, hypertension, history of pulmonary embolism on Eliquis, hyperlipidemia, CVA in 2014, COVID-19 infection, sepsis secondary to bacteremia from Klebsiella pneumonia, recurrent pancreatitis presents with worsening abdominal pain.  Of note patient underwent cholecystectomy on 12/21, ERCP on 12/22.  Post ERCP patient apparently developed pancreatitis.  He was managed conservatively and discharged home.  This admission patient had a CT done which showed pancreatic necrosis and fluid collections.  IR consulted and he underwent aspiration of the fluid collection on 04/18/2021.  Repeat CT showed stable findings.  He was also incidentally found to have significant pericardial effusion concerning for tamponade.  Cardiology was consulted and underwent pericardiocentesis on 05/01/2021.  He underwent 1 unit of PRBC transfusion on 05/02/2021. The pericardial catheter removed today on 2/22.  Cardiology on board.  Assessment & Plan:   Principal Problem:   Necrotizing pancreatitis Active Problems:   Coronary artery disease with stable angina pectoris (HCC)   Hyperlipemia   Chronic obstructive pulmonary disease (HCC)   Essential hypertension   History of pulmonary embolism   Sepsis ([place])   AKI (acute kidney injury) ([place])   Hypokalemia   Chronic respiratory failure with hypoxia (HCC)   Normocytic anemia   Hypoglycemia   Protein-calorie malnutrition, severe   Pericardial effusion   Pericardial tamponade   Necrotizing pancreatitis present on admission Patient was seen by GI and general surgery and have signed off.  IR consulted and patient underwent percutaneous drainage on 2/6.  Repeat CT on 2/16 shows stable findings. Abdominal pain has resolved no  nausea and vomiting at this time.     Pericardial effusion impending cardiac tamponade CT of the abdomen showed incidental finding of a significant pericardial effusion with concern for tamponade. Patient underwent urgent pericardiocentesis on 2/19 and was found to have a bloody fluid. His anticoagulation has been discontinued. Repeat echocardiogram on 2/20 showed trivial pericardial effusion. Pericardial drain was removed on 2/22. Pericardial Gram stain showed Enterococcus, ID on board appreciate recommendations.    Sepsis present on admission Appears to have resolved. Blood cultures have been negative so far    Chronic respiratory failure with hypoxia present on admission Patient on 2 L of nasal cannula oxygen to keep sats greater than 90%.    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   Hyperlipidemia Statin on hold.    Coronary artery disease Patient currently denies any chest pain.  Plavix is currently on hold.   Severe protein calorie malnutrition Encourage oral intake.   Acute kidney injury Presented with a creatinine of 2.4 Patient was aggressively hydrated.  Creatinine back to baseline.   Anemia of chronic disease Hemoglobin around 9.  Continue to monitor.  Mild Hyponatremia:  Improving sodium levels.    DVT prophylaxis: SCD's Code Status: full code.  Family Communication: family at bedside.  Disposition:   Status is: Inpatient  Remains inpatient appropriate because: IV antibiotics.      Consultants:  Cardiology.   Gastroenterology.  Interventional radiology.  General surgery.  Infectious disease   Procedures:  Drainage of fluid collection in the abdomen by interventional radiology on 2/6   Transthoracic  echocardiogram 2/17   Pericardiocentesis 2/19.     Antimicrobials:  Antibiotics Given (last 72 hours)     Date/Time Action Medication Dose Rate   05/01/21 1640 New Bag/Given   meropenem (MERREM) 1 g in sodium chloride 0.9 % 100 mL IVPB 1 g 200 mL/hr   05/01/21 2356 New Bag/Given   meropenem (MERREM) 1 g in sodium chloride 0.9 % 100 mL IVPB 1 g 200 mL/hr   05/02/21 0817 New Bag/Given   meropenem (MERREM) 1 g in sodium chloride 0.9 % 100 mL IVPB 1 g 200 mL/hr   05/02/21 1619 New Bag/Given   meropenem (MERREM) 1 g in sodium chloride 0.9 % 100 mL IVPB 1 g 200 mL/hr   05/03/21 0007 New Bag/Given   meropenem (MERREM) 1 g in sodium chloride 0.9 % 100 mL IVPB 1 g 200 mL/hr   05/03/21 5170 New Bag/Given   meropenem (MERREM) 1 g in sodium chloride 0.9 % 100 mL IVPB 1 g 200 mL/hr   05/03/21 1341 New Bag/Given   DAPTOmycin (CUBICIN) 700 mg in sodium chloride 0.9 % IVPB 700 mg 128 mL/hr   05/03/21 1637 New Bag/Given   meropenem (MERREM) 1 g in sodium chloride 0.9 % 100 mL IVPB 1 g 200 mL/hr   05/04/21 0000 New Bag/Given   meropenem (MERREM) 1 g in sodium chloride 0.9 % 100 mL IVPB 1 g 200 mL/hr   05/04/21 0748 New Bag/Given   meropenem (MERREM) 1 g in sodium chloride 0.9 % 100 mL IVPB 1 g 200 mL/hr        Subjective: No new complaints.   Objective: Vitals:   05/04/21 0800 05/04/21 0830 05/04/21 1055 05/04/21 1119  BP: 106/82  (!) 134/94   Pulse: (!) 116 (!) 111 (!) 111   Resp: (!) 25 (!) 26    Temp:    98.2 F (36.8 C)  TempSrc:    Oral  SpO2: 93% 93%    Weight:      Height:        Intake/Output Summary (Last 24 hours) at 05/04/2021 1126 Last data filed at 05/04/2021 0800 Gross per 24 hour  Intake 305.87 ml  Output 430 ml  Net -124.13 ml   Filed Weights   04/25/21 0500 05/02/21 0500 05/04/21 0500  Weight: 94 kg 87.6 kg 82.8 kg    Examination:  General exam: Appears calm and comfortable  Respiratory system: tachypnea, air entry fair, no wheezing heard.  Cardiovascular system: S1 & S2 heard, tachycardic, no pedal  edema.  Gastrointestinal system: Abdomen is nondistended, soft and nontender. Normal bowel sounds heard. Central nervous system: Alert and oriented. No focal neurological deficits. Extremities: Symmetric 5 x 5 power. Skin: No rashes, lesions or ulcers Psychiatry: Mood & affect appropriate.     Data Reviewed: I have personally reviewed following labs and imaging studies  CBC: Recent Labs  Lab 04/30/21 0214 05/01/21 0220 05/02/21 0120 05/02/21 1610 05/03/21 0007 05/04/21 0232  WBC 15.3* 15.1* 13.0*  --  15.7* 16.3*  HGB 7.9* 7.8* 7.1* 10.0* 9.9* 9.4*  HCT 24.4* 24.2* 22.3* 28.7* 28.8* 27.9*  MCV 86.8 87.1 86.1  --  84.2 84.3  PLT 445* 461* 383  --  376 017    Basic Metabolic Panel: Recent Labs  Lab 04/29/21 0303 05/01/21 0220 05/02/21 0120 05/03/21 0007 05/04/21 0232  NA 132* 130* 131* 129* 131*  K 4.2 5.0 3.9 3.3* 3.6  CL 100 97* 99 95* 98  CO2 22 19* 21* 25 24  GLUCOSE 115* 110* 90 95 96  BUN 28* 37* 41* 27* 26*  CREATININE 1.03 1.17 1.11 0.98 0.94  CALCIUM 9.0 9.5 8.6* 8.7* 8.6*  MG  --  2.4  --   --  1.7    GFR: Estimated Creatinine Clearance: 85.1 mL/min (by C-G formula based on SCr of 0.94 mg/dL).  Liver Function Tests: No results for input(s): AST, ALT, ALKPHOS, BILITOT, PROT, ALBUMIN in the last 168 hours.  CBG: Recent Labs  Lab 05/03/21 1114 05/03/21 1620 05/03/21 2215 05/04/21 0638 05/04/21 1121  GLUCAP 109* 101* 101* 97 124*     Recent Results (from the past 240 hour(s))  Surgical PCR screen     Status: None   Collection Time: 05/01/21 11:34 AM   Specimen: Nasal Mucosa; Nasal Swab  Result Value Ref Range Status   MRSA, PCR NEGATIVE NEGATIVE Final   Staphylococcus aureus NEGATIVE NEGATIVE Final    Comment: (NOTE) The Xpert SA Assay (FDA approved for NASAL specimens in patients 75 years of age and older), is one component of a comprehensive surveillance program. It is not intended to diagnose infection nor to guide or monitor  treatment. Performed at [facility], [place] [address], [place], [place] [address]   Culture, body fluid w Gram Stain-bottle     Status: Abnormal (Preliminary result)   Collection Time: 05/01/21 12:42 PM   Specimen: Pericardial  Result Value Ref Range Status   Specimen Description PERICARDIAL FLUID  Final   Special Requests SPEC A  Final   Gram Stain   Final    GRAM POSITIVE COCCI IN BOTH AEROBIC AND ANAEROBIC BOTTLES CRITICAL RESULT CALLED TO, READ BACK BY AND VERIFIED WITH: T ARMSTRONG,RN @0300  05/02/21 [place]    Culture (A)  Final    ENTEROCOCCUS FAECIUM VANCOMYCIN RESISTANT ENTEROCOCCUS CULTURE REINCUBATED FOR BETTER GROWTH Performed at [facility], [place] [address], [place], [place] [address]    Report Status PENDING  Incomplete   Organism ID, Bacteria ENTEROCOCCUS FAECIUM  Final      Susceptibility   Enterococcus faecium - MIC*    AMPICILLIN >=32 RESISTANT Resistant     VANCOMYCIN >=32 RESISTANT Resistant     GENTAMICIN SYNERGY SENSITIVE Sensitive     LINEZOLID 2 SENSITIVE Sensitive     * ENTEROCOCCUS FAECIUM  Gram stain     Status: None   Collection Time: 05/01/21 12:42 PM   Specimen: Pericardial  Result Value Ref Range Status   Specimen Description PERICARDIAL FLUID  Final   Special Requests SPEC A  Final   Gram Stain   Final    FEW WBC PRESENT,BOTH PMN AND MONONUCLEAR NO ORGANISMS SEEN Performed at [facility], [name] [address] [place], [place] [address]    Report Status 05/01/2021 FINAL  Final         Radiology Studies: No results found.      Scheduled Meds:  Chlorhexidine Gluconate Cloth  6 each Topical Daily   feeding supplement  237 mL Oral BID BM   fluticasone furoate-vilanterol  1 puff Inhalation Daily   And   umeclidinium bromide  1 puff Inhalation Daily   metoprolol tartrate  25 mg Oral BID   multivitamin with minerals  1 tablet Oral Daily   mupirocin ointment  1 application Nasal BID   pantoprazole  40 mg Oral BID   sodium  chloride flush  5 mL Intracatheter Q8H   vitamin B-12  1,000 mcg Per Tube Daily   Continuous Infusions:  sodium chloride     DAPTOmycin (CUBICIN)  IV Stopped (05/03/21 1411)   meropenem (MERREM) IV 200 mL/hr at 05/04/21 0800     LOS: 21 days        Hosie Poisson, MD [facility]   To contact the attending provider between 7A-7P or the covering provider during after hours 7P-7A, please log into the web site [email or web address] and access using universal [place] password for that web site. If you do not have the password, please call the hospital operator.  05/04/2021, 11:26 AM

## 2021-05-05 DIAGNOSIS — N179 Acute kidney failure, unspecified: Secondary | ICD-10-CM | POA: Diagnosis not present

## 2021-05-05 DIAGNOSIS — K8591 Acute pancreatitis with uninfected necrosis, unspecified: Secondary | ICD-10-CM | POA: Diagnosis not present

## 2021-05-05 DIAGNOSIS — I3139 Other pericardial effusion (noninflammatory): Secondary | ICD-10-CM | POA: Diagnosis not present

## 2021-05-05 DIAGNOSIS — I10 Essential (primary) hypertension: Secondary | ICD-10-CM | POA: Diagnosis not present

## 2021-05-05 DIAGNOSIS — R570 Cardiogenic shock: Secondary | ICD-10-CM | POA: Diagnosis not present

## 2021-05-05 DIAGNOSIS — J9611 Chronic respiratory failure with hypoxia: Secondary | ICD-10-CM | POA: Diagnosis not present

## 2021-05-05 LAB — CBC
HCT: 27.4 % — ABNORMAL LOW (ref 39.0–52.0)
Hemoglobin: 9.3 g/dL — ABNORMAL LOW (ref 13.0–17.0)
MCH: 29 pg (ref 26.0–34.0)
MCHC: 33.9 g/dL (ref 30.0–36.0)
MCV: 85.4 fL (ref 80.0–100.0)
Platelets: 334 10*3/uL (ref 150–400)
RBC: 3.21 MIL/uL — ABNORMAL LOW (ref 4.22–5.81)
RDW: 16.9 % — ABNORMAL HIGH (ref 11.5–15.5)
WBC: 17.5 10*3/uL — ABNORMAL HIGH (ref 4.0–10.5)
nRBC: 0 % (ref 0.0–0.2)

## 2021-05-05 LAB — BASIC METABOLIC PANEL
Anion gap: 10 (ref 5–15)
BUN: 23 mg/dL — ABNORMAL HIGH (ref 6–20)
CO2: 22 mmol/L (ref 22–32)
Calcium: 8.6 mg/dL — ABNORMAL LOW (ref 8.9–10.3)
Chloride: 97 mmol/L — ABNORMAL LOW (ref 98–111)
Creatinine, Ser: 1.04 mg/dL (ref 0.61–1.24)
GFR, Estimated: >60 mL/min (ref >60)
Glucose, Bld: 97 mg/dL (ref 70–99)
Potassium: 4 mmol/L (ref 3.5–5.1)
Sodium: 129 mmol/L — ABNORMAL LOW (ref 135–145)

## 2021-05-05 LAB — GLUCOSE, CAPILLARY
Glucose-Capillary: 105 mg/dL — ABNORMAL HIGH (ref 70–99)
Glucose-Capillary: 111 mg/dL — ABNORMAL HIGH (ref 70–99)
Glucose-Capillary: 105 mg/dL — ABNORMAL HIGH (ref 70–99)
Glucose-Capillary: 116 mg/dL — ABNORMAL HIGH (ref 70–99)

## 2021-05-05 NOTE — Progress Notes (Addendum)
Cardiology Progress Note  Patient ID: Scott Foley MRN: [id number] DOB: [birth year] Date of Encounter: 05/05/2021  Primary Cardiologist: None  Subjective   Chief Complaint: Weakness  HPI: no issues overnight. Removed pericardial drain yesterday.   Sinus tachycardia HDS, normal pressures 3L satting mid 90s  Crt normal Hgb stable 9   ROS:  All other ROS reviewed and negative. Pertinent positives noted in the HPI.     Inpatient Medications  Scheduled Meds:  Chlorhexidine Gluconate Cloth  6 each Topical Daily   feeding supplement  237 mL Oral BID BM   fluticasone furoate-vilanterol  1 puff Inhalation Daily   And   umeclidinium bromide  1 puff Inhalation Daily   metoprolol tartrate  25 mg Oral BID   multivitamin with minerals  1 tablet Oral Daily   mupirocin ointment  1 application Nasal BID   pantoprazole  40 mg Oral BID   sodium chloride flush  5 mL Intracatheter Q8H   vitamin B-12  1,000 mcg Per Tube Daily   Continuous Infusions:  sodium chloride     DAPTOmycin (CUBICIN)  IV 700 mg (05/04/21 2114)   meropenem (MERREM) IV 1 g (05/05/21 0019)   PRN Meds: acetaminophen, albuterol, morphine injection, ondansetron **OR** ondansetron (ZOFRAN) IV, oxyCODONE   Vital Signs   Vitals:   05/05/21 0700 05/05/21 0745 05/05/21 0800 05/05/21 0836  BP: 122/89  121/79   Pulse: (!) 103  (!) 112   Resp: (!) 23  (!) 26   Temp:  97.8 F (36.6 C)    TempSrc:  Oral    SpO2: 93%  92% 95%  Weight:      Height:        Intake/Output Summary (Last 24 hours) at 05/05/2021 0927 Last data filed at 05/05/2021 0000 Gross per 24 hour  Intake 163.27 ml  Output 500 ml  Net -336.73 ml   Last 3 Weights 05/05/2021 05/04/2021 05/02/2021  Weight (lbs) 182 lb 15.7 oz 182 lb 8.7 oz 193 lb 2 oz  Weight (kg) 83 kg 82.8 kg 87.6 kg      Telemetry  Sinus tachycardia  Physical Exam   Vitals:   05/05/21 0700 05/05/21 0745 05/05/21 0800 05/05/21 0836  BP: 122/89  121/79   Pulse: (!) 103   (!) 112   Resp: (!) 23  (!) 26   Temp:  97.8 F (36.6 C)    TempSrc:  Oral    SpO2: 93%  92% 95%  Weight:      Height:        Intake/Output Summary (Last 24 hours) at 05/05/2021 0927 Last data filed at 05/05/2021 0000 Gross per 24 hour  Intake 163.27 ml  Output 500 ml  Net -336.73 ml    Last 3 Weights 05/05/2021 05/04/2021 05/02/2021  Weight (lbs) 182 lb 15.7 oz 182 lb 8.7 oz 193 lb 2 oz  Weight (kg) 83 kg 82.8 kg 87.6 kg    Body mass index is 31.41 kg/m.   Physical Exam Gen: well appearing , no distress Neuro: alert and oriented CV: tachycardic no murmurs.  Drain site clean and dry Pulm: nl wob, decreased lung sounds in the bases Abd: non distended Ext: No LE edema Skin: warm and well perfused Psych: normal mood    Labs  High Sensitivity Troponin:  No results for input(s): TROPONINIHS in the last 720 hours.   Cardiac EnzymesNo results for input(s): TROPONINI in the last 168 hours. No results for input(s): TROPIPOC in the last 168 hours.  Chemistry Recent Labs  Lab 05/03/21 0007 05/04/21 0232 05/05/21 0321  NA 129* 131* 129*  K 3.3* 3.6 4.0  CL 95* 98 97*  CO2 25 24 22   GLUCOSE 95 96 97  BUN 27* 26* 23*  CREATININE 0.98 0.94 1.04  CALCIUM 8.7* 8.6* 8.6*  GFRNONAA >60 >60 >60  ANIONGAP 9 9 10     Hematology Recent Labs  Lab 05/03/21 0007 05/04/21 0232 05/05/21 0321  WBC 15.7* 16.3* 17.5*  RBC 3.42* 3.31* 3.21*  HGB 9.9* 9.4* 9.3*  HCT 28.8* 27.9* 27.4*  MCV 84.2 84.3 85.4  MCH 28.9 28.4 29.0  MCHC 34.4 33.7 33.9  RDW 16.3* 16.4* 16.9*  PLT 376 354 334   BNPNo results for input(s): BNP, PROBNP in the last 168 hours.  DDimer No results for input(s): DDIMER in the last 168 hours.   Radiology  No results found.  Cardiac Studies  TTE 04/30/2021  1. Respiratory variation is 34% across the MV. The RA is not well  visualized. There is evidence of early RV diastolic collapse. Similar  findings of tamponade physiology to 04/28/2021 echo. Chart reviewed,   hemodynamics stable since last echo. Appears plan   has been close inpatient monitoring, potential pericardiocentesis if  becomes clinically indicated. . Large pericardial effusion. The  pericardial effusion is circumferential.   2. The inferior vena cava is dilated in size with <50% respiratory  variability, suggesting right atrial pressure of 15 mmHg.   3. Limited echo to evaluate pericardial effusion   Patient Profile  Scott Foley is a 56 y.o. male with CAD, hypertension, PE on Eliquis, stroke, iron deficiency anemia admitted on 04/13/2021 with severe acute pancreatitis with sepsis.  Cardiology was consulted on 04/28/2021 for large pericardial effusion seen on CT scan.  Pericardiocentesis was delayed due to necrotizing pancreatitis.  Evaluation on 05/01/2021 shows worsening hypotension and tachycardia concerning for pericardial tamponade.  Currently admitted to the hospital with complications of necrotizing pancreatitis.  He developed pancreatitis post ERCP from a sending cholangitis in December 2023.  He has had a rather complicated course with recurrent pancreatitis as well as necrotizing pancreatitis requiring drainage recently.  He has had multiple aspirations of pancreatic fluid collections. CT scan on 04/28/2021 demonstrated large circumferential pericardial effusion.    Assessment & Plan   #Large pericardial effusion with pericardial tamponade #Hypotension #Cardiogenic shock - resolved, s/p pericardiocentesis and drain placement 2/60for cardiac tamponade -if he develops pleuritic chest pain will consider tx for pericarditis. He has remained chest pain free - repeat limited echo 2/20 showed trivial pericardial effusion, drain output had been consistently < 50 cc in 24 hours - removed pericardial drain 2/22  #Sinus tachycardia: ddx includes dehydration/sepsis - fluids as needed - on antibiotics  #Necrotizing pancreatitis #Sepsis -Treatment per hospital medicine -on  Abx  #Anemia -appears to be chronic -stable  #History of PE - continue to hold [facility] until 2/24  #Stroke hx: - continue to hold plavix until 2/24   For questions or updates, please contact [place] Please consult [email or web address] for contact info under     Janina Mayo, MD

## 2021-05-05 NOTE — Progress Notes (Signed)
IVT consulted for difficult PIV placment.  Upon arrival pt in chair.  RN to get pt to bed and IVT to return asap

## 2021-05-05 NOTE — Progress Notes (Signed)
PROGRESS NOTE    Scott Foley  GQQ:[id number] DOB: 1965/10/22 DOA: 04/13/2021 PCP: [name], MD   Chief Complaint  Patient presents with   Abdominal Pain    Brief Narrative:    56 year old male with prior history of coronary artery disease, hypertension, history of pulmonary embolism on Eliquis, hyperlipidemia, CVA in 2014, COVID-19 infection, sepsis secondary to bacteremia from Klebsiella pneumonia, recurrent pancreatitis presents with worsening abdominal pain.  Of note patient underwent cholecystectomy on 12/21, ERCP on 12/22.  Post ERCP patient apparently developed pancreatitis.  He was managed conservatively and discharged home.  This admission patient had a CT done which showed pancreatic necrosis and fluid collections.  IR consulted and he underwent aspiration of the fluid collection on 04/18/2021.  Repeat CT showed stable findings.  He was also incidentally found to have significant pericardial effusion concerning for tamponade.  Cardiology was consulted and underwent pericardiocentesis on 05/01/2021.  He underwent 1 unit of PRBC transfusion on 05/02/2021. The pericardial catheter removed today on 2/22.  Cardiology on board.  Pt seen and examined today. Not much drain from the abd catheter.   Assessment & Plan:   Principal Problem:   Necrotizing pancreatitis Active Problems:   Coronary artery disease with stable angina pectoris (HCC)   Hyperlipemia   Chronic obstructive pulmonary disease (HCC)   Essential hypertension   History of pulmonary embolism   Sepsis ([place])   AKI (acute kidney injury) ([place])   Hypokalemia   Chronic respiratory failure with hypoxia (HCC)   Normocytic anemia   Hypoglycemia   Protein-calorie malnutrition, severe   Pericardial effusion   Pericardial tamponade   Necrotizing pancreatitis present on admission Patient was seen by GI and general surgery and have signed off.  IR consulted and patient underwent percutaneous drainage on 2/6.  Cultures  growing klebsiella pneumonia and citrobacter freundii.  Repeat CT on 2/16 shows stable findings. Abd drain in place and not much drainage overnight. Will need a repeat CT abdomen and pelvis prior to the removal of the drain.  Abdominal pain has resolved no nausea and vomiting at this time.    Pericardial effusion impending cardiac tamponade CT of the abdomen showed incidental finding of a significant pericardial effusion with concern for tamponade. Patient underwent urgent pericardiocentesis on 2/19 and was found to have a bloody fluid. His anticoagulation has been discontinued. Cardiology recommends holding AC till 2.24. Repeat echocardiogram on 2/20 showed trivial pericardial effusion. Pericardial drain was removed on 2/22. Pericardial fluid showing enterococcus Faecium, Faecalis and VRE, pt currently on IV meropenam and IV daptomycin. ID on board and following.     Sepsis present on admission Appears to have resolved. Blood cultures have been negative so far    Chronic respiratory failure with hypoxia present on admission Patient on 2 to 3 L of nasal cannula oxygen to keep sats greater than 90%.    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 To hold the [facility] till 05/06/21.    Hypertension Blood pressure parameters are well controlled.    Hyperlipidemia Statin on hold.    Coronary artery disease Patient currently denies any chest pain.  Plavix is currently on hold.   Severe protein calorie malnutrition Encourage oral intake.   Acute kidney injury Presented with a creatinine of 2.4 Patient was aggressively hydrated.  Creatinine back to baseline.   Anemia of chronic disease Hemoglobin around 9.  Continue to monitor.  Hyponatremia:  Worsened to 129. Check tsh, serum osmo.  Repeat sodium in am.    DVT prophylaxis: SCD's Code Status: full code.  Family  Communication: None at bedside.  Disposition:   Status is: Inpatient  Remains inpatient appropriate because: IV antibiotics.      Consultants:  Cardiology.   Gastroenterology.  Interventional radiology.  General surgery.  Infectious disease   Procedures:  Drainage of fluid collection in the abdomen by interventional radiology on 2/6   Transthoracic echocardiogram 2/17   Pericardiocentesis 2/19.    Antimicrobials:  Antibiotics Given (last 72 hours)     Date/Time Action Medication Dose Rate   05/02/21 1619 New Bag/Given   meropenem (MERREM) 1 g in sodium chloride 0.9 % 100 mL IVPB 1 g 200 mL/hr   05/03/21 0007 New Bag/Given   meropenem (MERREM) 1 g in sodium chloride 0.9 % 100 mL IVPB 1 g 200 mL/hr   05/03/21 0921 New Bag/Given   meropenem (MERREM) 1 g in sodium chloride 0.9 % 100 mL IVPB 1 g 200 mL/hr   05/03/21 1341 New Bag/Given   DAPTOmycin (CUBICIN) 700 mg in sodium chloride 0.9 % IVPB 700 mg 128 mL/hr   05/03/21 1637 New Bag/Given   meropenem (MERREM) 1 g in sodium chloride 0.9 % 100 mL IVPB 1 g 200 mL/hr   05/04/21 0000 New Bag/Given   meropenem (MERREM) 1 g in sodium chloride 0.9 % 100 mL IVPB 1 g 200 mL/hr   05/04/21 0748 New Bag/Given   meropenem (MERREM) 1 g in sodium chloride 0.9 % 100 mL IVPB 1 g 200 mL/hr   05/04/21 1604 New Bag/Given   meropenem (MERREM) 1 g in sodium chloride 0.9 % 100 mL IVPB 1 g 200 mL/hr   05/04/21 2114 New Bag/Given   DAPTOmycin (CUBICIN) 700 mg in sodium chloride 0.9 % IVPB 700 mg 128 mL/hr   05/05/21 0019 New Bag/Given   meropenem (MERREM) 1 g in sodium chloride 0.9 % 100 mL IVPB 1 g 200 mL/hr   05/05/21 0952 New Bag/Given   meropenem (MERREM) 1 g in sodium chloride 0.9 % 100 mL IVPB 1 g 200 mL/hr        Subjective: Some soreness in the abdomen at the site of the drain. No nausea, vomiting. No chest pain.   Objective: Vitals:   05/05/21 1000 05/05/21 1100 05/05/21 1156 05/05/21 1200  BP: 128/84 110/89  110/77  Pulse: (!)  101 89  94  Resp: (!) 21 20  (!) 21  Temp:   98.5 F (36.9 C)   TempSrc:   Oral   SpO2: 93% 93%  90%  Weight:      Height:        Intake/Output Summary (Last 24 hours) at 05/05/2021 1459 Last data filed at 05/05/2021 1200 Gross per 24 hour  Intake 663.27 ml  Output 1650 ml  Net -986.73 ml    Filed Weights   05/02/21 0500 05/04/21 0500 05/05/21 0500  Weight: 87.6 kg 82.8 kg 83 kg    Examination:  General exam: Appears calm and comfortable  Respiratory system: Clear to auscultation. Respiratory effort normal. Cardiovascular system: S1 & S2 heard, RRR. No JVD, No pedal edema. Gastrointestinal system: Abdomen is soft tender in the right lower quadrant, IR drain in place.. Normal bowel sounds heard. Central nervous system: Alert and oriented. No focal neurological deficits. Extremities: Symmetric 5 x 5 power. Skin: No rashes, lesions or ulcers Psychiatry: mood appropriate.      Data Reviewed:  I have personally reviewed following labs and imaging studies  CBC: Recent Labs  Lab 05/01/21 0220 05/02/21 0120 05/02/21 1610 05/03/21 0007 05/04/21 0232 05/05/21 0321  WBC 15.1* 13.0*  --  15.7* 16.3* 17.5*  HGB 7.8* 7.1* 10.0* 9.9* 9.4* 9.3*  HCT 24.2* 22.3* 28.7* 28.8* 27.9* 27.4*  MCV 87.1 86.1  --  84.2 84.3 85.4  PLT 461* 383  --  376 354 334     Basic Metabolic Panel: Recent Labs  Lab 05/01/21 0220 05/02/21 0120 05/03/21 0007 05/04/21 0232 05/05/21 0321  NA 130* 131* 129* 131* 129*  K 5.0 3.9 3.3* 3.6 4.0  CL 97* 99 95* 98 97*  CO2 19* 21* 25 24 22   GLUCOSE 110* 90 95 96 97  BUN 37* 41* 27* 26* 23*  CREATININE 1.17 1.11 0.98 0.94 1.04  CALCIUM 9.5 8.6* 8.7* 8.6* 8.6*  MG 2.4  --   --  1.7  --      GFR: Estimated Creatinine Clearance: 77.1 mL/min (by C-G formula based on SCr of 1.04 mg/dL).  Liver Function Tests: No results for input(s): AST, ALT, ALKPHOS, BILITOT, PROT, ALBUMIN in the last 168 hours.  CBG: Recent Labs  Lab 05/04/21 0638  05/04/21 1121 05/04/21 1639 05/05/21 0620 05/05/21 1154  GLUCAP 97 124* 102* 105* 111*      Recent Results (from the past 240 hour(s))  Surgical PCR screen     Status: None   Collection Time: 05/01/21 11:34 AM   Specimen: Nasal Mucosa; Nasal Swab  Result Value Ref Range Status   MRSA, PCR NEGATIVE NEGATIVE Final   Staphylococcus aureus NEGATIVE NEGATIVE Final    Comment: (NOTE) The Xpert SA Assay (FDA approved for NASAL specimens in patients 35 years of age and older), is one component of a comprehensive surveillance program. It is not intended to diagnose infection nor to guide or monitor treatment. Performed at [facility], [place] [address] [place], [place] [address]   Culture, body fluid w Gram Stain-bottle     Status: Abnormal (Preliminary result)   Collection Time: 05/01/21 12:42 PM   Specimen: Pericardial  Result Value Ref Range Status   Specimen Description PERICARDIAL FLUID  Final   Special Requests SPEC A  Final   Gram Stain   Final    GRAM POSITIVE COCCI IN BOTH AEROBIC AND ANAEROBIC BOTTLES CRITICAL RESULT CALLED TO, READ BACK BY AND VERIFIED WITH: T ARMSTRONG,RN @0300  05/02/21 [place]    Culture (A)  Final    ENTEROCOCCUS FAECIUM VANCOMYCIN RESISTANT ENTEROCOCCUS ENTEROCOCCUS FAECALIS SUSCEPTIBILITIES TO FOLLOW Performed at [facility], [place] [address], [place], [place] [address]    Report Status PENDING  Incomplete   Organism ID, Bacteria ENTEROCOCCUS FAECIUM  Final      Susceptibility   Enterococcus faecium - MIC*    AMPICILLIN >=32 RESISTANT Resistant     VANCOMYCIN >=32 RESISTANT Resistant     GENTAMICIN SYNERGY SENSITIVE Sensitive     LINEZOLID 2 SENSITIVE Sensitive     * ENTEROCOCCUS FAECIUM  Gram stain     Status: None   Collection Time: 05/01/21 12:42 PM   Specimen: Pericardial  Result Value Ref Range Status   Specimen Description PERICARDIAL FLUID  Final   Special Requests SPEC A  Final   Gram Stain   Final    FEW WBC PRESENT,BOTH  PMN AND MONONUCLEAR NO ORGANISMS SEEN Performed at [facility], [place] [address] [place], [place] [address]    Report Status  05/01/2021 FINAL  Final          Radiology Studies: No results found.      Scheduled Meds:  Chlorhexidine Gluconate Cloth  6 each Topical Daily   feeding supplement  237 mL Oral BID BM   fluticasone furoate-vilanterol  1 puff Inhalation Daily   And   umeclidinium bromide  1 puff Inhalation Daily   metoprolol tartrate  25 mg Oral BID   multivitamin with minerals  1 tablet Oral Daily   mupirocin ointment  1 application Nasal BID   pantoprazole  40 mg Oral BID   sodium chloride flush  5 mL Intracatheter Q8H   vitamin B-12  1,000 mcg Per Tube Daily   Continuous Infusions:  sodium chloride     DAPTOmycin (CUBICIN)  IV 700 mg (05/04/21 2114)   meropenem (MERREM) IV 1 g (05/05/21 0952)     LOS: 22 days        Hosie Poisson, MD [facility]   To contact the attending provider between 7A-7P or the covering provider during after hours 7P-7A, please log into the web site [email or web address] and access using universal [place] password for that web site. If you do not have the password, please call the hospital operator.  05/05/2021, 2:59 PM

## 2021-05-05 NOTE — Plan of Care (Signed)

## 2021-05-05 NOTE — Progress Notes (Signed)
Patient transferred to 4E-21 from 2H-11 via wheelchair. Pt AO4, able to to stand to wheelchair, no complaints of pain, VSS, and all patient belongings transported with patient.   BSSR given to receiving nurse, chart given to unit secretary.

## 2021-05-05 NOTE — Progress Notes (Signed)
Physical Therapy Treatment Patient Details Name: Scott Foley MRN: [id number] DOB: Dec 30, 1965 Today's Date: 05/05/2021   History of Present Illness Pt is a 56 y.o. M who presents 04/13/2021 with abdominal pain. Admitted with acute pancreatitis with sepsis. S/p lap chole 03/02/2021 with abnormal IOC; underwent ERCP 12/22/222. S/p IR pec drain. Underwent pericardiocentesis on 2/19.  Significant PMH: CAD, HTN, HLD, COPD, PE, CVA, IDA, tobacco abuse.    PT Comments    Patient progressing well towards PT goals. Session focused on progressive ambulation and weaning supplemental 02. Sp02 dropped to mid 80s on 2L/min 02 [place] with mobility with 2-3/4 DOE. Pt reaching for rail for support at times. Would benefit from [facility] for stability. Titrated 02 down to 2L at rest from 3L to attempt weaning. Reports feeling below 50% of baseline. Continue to follow to work on activity tolerance, balance and endurance.   Recommendations for follow up therapy are one component of a multi-disciplinary discharge planning process, led by the attending physician.  Recommendations may be updated based on patient status, additional functional criteria and insurance authorization.  Follow Up Recommendations  Home health PT     Assistance Recommended at Discharge PRN  Patient can return home with the following A little help with walking and/or transfers;A little help with bathing/dressing/bathroom;Help with stairs or ramp for entrance   Equipment Recommendations  Cane    Recommendations for Other Services       Precautions / Restrictions Precautions Precautions: Fall;Other (comment) Precaution Comments: RLQ drain Restrictions Weight Bearing Restrictions: No     Mobility  Bed Mobility Overal bed mobility: Modified Independent                  Transfers Overall transfer level: Needs assistance Equipment used: None Transfers: Sit to/from Stand Sit to Stand: Supervision           General  transfer comment: SUpervision for safety, aware of RLQ drain    Ambulation/Gait Ambulation/Gait assistance: Min guard Gait Distance (Feet): 250 Feet Assistive device: None Gait Pattern/deviations: Step-through pattern, Decreased stride length, Narrow base of support Gait velocity: decreased Gait velocity interpretation: <1.8 ft/sec, indicate of risk for recurrent falls   General Gait Details: Narrower BOS, intermittently reaching for external support, min guard for safety. cues for upright posture and activity pacing. RR 37, Sp02 diffculty getting good wave form, likely mid 80s on 2L/min 02 [place]. 2/4 DOE.   Stairs             Wheelchair Mobility    Modified Rankin (Stroke Patients Only)       Balance Overall balance assessment: Mild deficits observed, not formally tested                                          Cognition Arousal/Alertness: Awake/alert Behavior During Therapy: WFL for tasks assessed/performed, Flat affect Overall Cognitive Status: Within Functional Limits for tasks assessed                                          Exercises      General Comments        Pertinent Vitals/Pain Pain Assessment Pain Assessment: No/denies pain    Home Living  Prior Function            PT Goals (current goals can now be found in the care plan section) Acute Rehab PT Goals Patient Stated Goal: go home PT Goal Formulation: With patient Time For Goal Achievement: 05/19/21 Potential to Achieve Goals: Good Progress towards PT goals: Progressing toward goals    Frequency    Min 2X/week      PT Plan Current plan remains appropriate    Co-evaluation              AM-PAC PT "6 Clicks" Mobility   Outcome Measure  Help needed turning from your back to your side while in a flat bed without using bedrails?: None Help needed moving from lying on your back to sitting on the side of a  flat bed without using bedrails?: None Help needed moving to and from a bed to a chair (including a wheelchair)?: A Little Help needed standing up from a chair using your arms (e.g., wheelchair or bedside chair)?: A Little Help needed to walk in hospital room?: A Little Help needed climbing 3-5 steps with a railing? : A Little 6 Click Score: 20    End of Session Equipment Utilized During Treatment: Gait belt Activity Tolerance: Patient tolerated treatment well Patient left: in chair;with call bell/phone within reach Nurse Communication: Mobility status PT Visit Diagnosis: Unsteadiness on feet (R26.81);Muscle weakness (generalized) (M62.81);Difficulty in walking, not elsewhere classified (R26.2)     Time: 6811-5726 PT Time Calculation (min) (ACUTE ONLY): 19 min  Charges:  $Gait Training: 8-22 mins                     Marisa Severin, PT, DPT Acute Rehabilitation Services Pager [phone] Office [phone]      [place] 05/05/2021, 3:12 PM

## 2021-05-06 ENCOUNTER — Telehealth: Payer: Medicare HMO

## 2021-05-06 DIAGNOSIS — K859 Acute pancreatitis without necrosis or infection, unspecified: Secondary | ICD-10-CM | POA: Diagnosis not present

## 2021-05-06 DIAGNOSIS — N179 Acute kidney failure, unspecified: Secondary | ICD-10-CM | POA: Diagnosis not present

## 2021-05-06 DIAGNOSIS — K8591 Acute pancreatitis with uninfected necrosis, unspecified: Secondary | ICD-10-CM | POA: Diagnosis not present

## 2021-05-06 DIAGNOSIS — I314 Cardiac tamponade: Secondary | ICD-10-CM | POA: Diagnosis not present

## 2021-05-06 DIAGNOSIS — J9611 Chronic respiratory failure with hypoxia: Secondary | ICD-10-CM | POA: Diagnosis not present

## 2021-05-06 DIAGNOSIS — B952 Enterococcus as the cause of diseases classified elsewhere: Secondary | ICD-10-CM

## 2021-05-06 DIAGNOSIS — I10 Essential (primary) hypertension: Secondary | ICD-10-CM | POA: Diagnosis not present

## 2021-05-06 DIAGNOSIS — R570 Cardiogenic shock: Secondary | ICD-10-CM | POA: Diagnosis not present

## 2021-05-06 DIAGNOSIS — I3139 Other pericardial effusion (noninflammatory): Secondary | ICD-10-CM | POA: Diagnosis not present

## 2021-05-06 LAB — CULTURE, BODY FLUID W GRAM STAIN -BOTTLE

## 2021-05-06 LAB — BASIC METABOLIC PANEL
Anion gap: 8 (ref 5–15)
BUN: 23 mg/dL — ABNORMAL HIGH (ref 6–20)
CO2: 23 mmol/L (ref 22–32)
Calcium: 8.7 mg/dL — ABNORMAL LOW (ref 8.9–10.3)
Chloride: 100 mmol/L (ref 98–111)
Creatinine, Ser: 0.89 mg/dL (ref 0.61–1.24)
GFR, Estimated: >60 mL/min (ref >60)
Glucose, Bld: 97 mg/dL (ref 70–99)
Potassium: 3.8 mmol/L (ref 3.5–5.1)
Sodium: 131 mmol/L — ABNORMAL LOW (ref 135–145)

## 2021-05-06 LAB — GLUCOSE, CAPILLARY: Glucose-Capillary: 93 mg/dL (ref 70–99)

## 2021-05-06 LAB — OSMOLALITY: Osmolality: 280 mOsm/kg (ref 275–295)

## 2021-05-06 LAB — C-REACTIVE PROTEIN: CRP: 12.7 mg/dL — ABNORMAL HIGH (ref <1.0)

## 2021-05-06 MED ORDER — VITAMIN B-12 1000 MCG PO TABS
1000.0000 ug | ORAL_TABLET | Freq: Every day | ORAL | Status: DC
  Administered 2021-05-06 – 2021-05-10 (×5): 1000 ug via ORAL
  Filled 2021-05-06 (×5): qty 1

## 2021-05-06 MED ORDER — APIXABAN 5 MG PO TABS
5.0000 mg | ORAL_TABLET | Freq: Two times a day (BID) | ORAL | Status: DC
  Administered 2021-05-06 – 2021-05-10 (×9): 5 mg via ORAL
  Filled 2021-05-06 (×9): qty 1

## 2021-05-06 MED ORDER — CLOPIDOGREL BISULFATE 75 MG PO TABS
75.0000 mg | ORAL_TABLET | Freq: Every day | ORAL | Status: DC
  Administered 2021-05-06 – 2021-05-10 (×5): 75 mg via ORAL
  Filled 2021-05-06 (×5): qty 1

## 2021-05-06 NOTE — Progress Notes (Signed)
Removed 20 ml creamy drainage from RUQ drain. Flushed with 5 ml.  Called IR to make aware that patient had not been seen since 2/22. Per IR will make MD aware. PA to follow up with patient 2/25.  Patient is aware. Payton Emerald, RN

## 2021-05-06 NOTE — Progress Notes (Signed)
Cardiology Progress Note  Patient ID: Scott Foley MRN: [id number] DOB: October 04, 1965 Date of Encounter: 05/06/2021  Primary Cardiologist: Sanda Klein, MD  Subjective   Chief Complaint: Weakness  HPI: did well overnight, peric drain out 02/22, sats ok on room air No chest pain or SOB, initially did 500 cc on IS, but got up to 750 cc in a few breaths  VSS  Cr 0.89, no CBC today   ROS:  All other ROS reviewed and negative. Pertinent positives noted in the HPI.     Inpatient Medications  Scheduled Meds:  Chlorhexidine Gluconate Cloth  6 each Topical Daily   feeding supplement  237 mL Oral BID BM   fluticasone furoate-vilanterol  1 puff Inhalation Daily   And   umeclidinium bromide  1 puff Inhalation Daily   metoprolol tartrate  25 mg Oral BID   multivitamin with minerals  1 tablet Oral Daily   mupirocin ointment  1 application Nasal BID   pantoprazole  40 mg Oral BID   sodium chloride flush  5 mL Intracatheter Q8H   vitamin B-12  1,000 mcg Per Tube Daily   Continuous Infusions:  sodium chloride     DAPTOmycin (CUBICIN)  IV Stopped (05/05/21 2011)   meropenem (MERREM) IV Stopped (05/06/21 0038)   PRN Meds: acetaminophen, albuterol, morphine injection, ondansetron **OR** ondansetron (ZOFRAN) IV, oxyCODONE   Vital Signs   Vitals:   05/06/21 0046 05/06/21 0436 05/06/21 0443 05/06/21 0742  BP: 104/78 115/81  122/84  Pulse: 94 97  (!) 106  Resp: 20 20  20   Temp: 98.4 F (36.9 C) 97.9 F (36.6 C)  97.6 F (36.4 C)  TempSrc: Oral Oral  Oral  SpO2: 96% 94%  93%  Weight:   82.3 kg   Height:        Intake/Output Summary (Last 24 hours) at 05/06/2021 0845 Last data filed at 05/06/2021 0742 Gross per 24 hour  Intake 1692.9 ml  Output 2025 ml  Net -332.1 ml   Last 3 Weights 05/06/2021 05/05/2021 05/04/2021  Weight (lbs) 181 lb 7 oz 182 lb 15.7 oz 182 lb 8.7 oz  Weight (kg) 82.3 kg 83 kg 82.8 kg      Telemetry   SR, ST -  Personally reviewed  Physical  Exam   Vitals:   05/06/21 0046 05/06/21 0436 05/06/21 0443 05/06/21 0742  BP: 104/78 115/81  122/84  Pulse: 94 97  (!) 106  Resp: 20 20  20   Temp: 98.4 F (36.9 C) 97.9 F (36.6 C)  97.6 F (36.4 C)  TempSrc: Oral Oral  Oral  SpO2: 96% 94%  93%  Weight:   82.3 kg   Height:        Intake/Output Summary (Last 24 hours) at 05/06/2021 0845 Last data filed at 05/06/2021 0742 Gross per 24 hour  Intake 1692.9 ml  Output 2025 ml  Net -332.1 ml    Last 3 Weights 05/06/2021 05/05/2021 05/04/2021  Weight (lbs) 181 lb 7 oz 182 lb 15.7 oz 182 lb 8.7 oz  Weight (kg) 82.3 kg 83 kg 82.8 kg    Body mass index is 31.14 kg/m.   GEN: No acute distress.   Neck: No JVD Cardiac: RRR, no murmur, no rubs, or gallops. Peric drain site w/out drainage, ecchymosis Respiratory: diminished to auscultation bilaterally with rales in the bases, improved w/ deep breathing GI: Soft, tender, non-distended  MS: No edema; No deformity. Neuro:  Nonfocal  Psych: Normal affect   Labs  High Sensitivity Troponin:  No results for input(s): TROPONINIHS in the last 720 hours.   Cardiac EnzymesNo results for input(s): TROPONINI in the last 168 hours. No results for input(s): TROPIPOC in the last 168 hours.  Chemistry Recent Labs  Lab 05/04/21 0232 05/05/21 0321 05/06/21 0200  NA 131* 129* 131*  K 3.6 4.0 3.8  CL 98 97* 100  CO2 24 22 23   GLUCOSE 96 97 97  BUN 26* 23* 23*  CREATININE 0.94 1.04 0.89  CALCIUM 8.6* 8.6* 8.7*  GFRNONAA >60 >60 >60  ANIONGAP 9 10 8     Hematology Recent Labs  Lab 05/03/21 0007 05/04/21 0232 05/05/21 0321  WBC 15.7* 16.3* 17.5*  RBC 3.42* 3.31* 3.21*  HGB 9.9* 9.4* 9.3*  HCT 28.8* 27.9* 27.4*  MCV 84.2 84.3 85.4  MCH 28.9 28.4 29.0  MCHC 34.4 33.7 33.9  RDW 16.3* 16.4* 16.9*  PLT 376 354 334   BNPNo results for input(s): BNP, PROBNP in the last 168 hours.   Lab Results  Component Value Date   CKTOTAL 7 (L) 05/04/2021        Component Value Date/Time   CRP 13.0  (H) 05/04/2021 1241   CRP 17.6 (H) 05/02/2021 1004   CRP 11.1 (H) 04/29/2021 1130     Radiology  No results found.  Cardiac Studies   ECHO: LIMITED 05/02/2021  1. Left ventricular ejection fraction, by estimation, is 55 to 60%. The  left ventricle has normal function.   2. The moderate pericardial effusion has been tapped . The remaining  pericardial effusion is now trivial.   3. The mitral valve is grossly normal. Trivial mitral valve  regurgitation.   TTE 04/30/2021  1. Respiratory variation is 34% across the MV. The RA is not well  visualized. There is evidence of early RV diastolic collapse. Similar  findings of tamponade physiology to 04/28/2021 echo. Chart reviewed,  hemodynamics stable since last echo. Appears plan   has been close inpatient monitoring, potential pericardiocentesis if  becomes clinically indicated. . Large pericardial effusion. The  pericardial effusion is circumferential.   2. The inferior vena cava is dilated in size with <50% respiratory  variability, suggesting right atrial pressure of 15 mmHg.   3. Limited echo to evaluate pericardial effusion   Patient Profile  Thijs Brunton is a 56 y.o. male with CAD, hypertension, PE on Eliquis, stroke, iron deficiency anemia, admitted on 04/13/2021 with severe acute pancreatitis with sepsis.    Cardiology was consulted on 04/28/2021 for large pericardial effusion seen on CT scan.  Pericardiocentesis was delayed due to necrotizing pancreatitis.  Evaluation on 05/01/2021 shows worsening hypotension and tachycardia concerning for pericardial tamponade.  Currently admitted to the hospital with complications of necrotizing pancreatitis.  He developed pancreatitis post ERCP from ascending cholangitis in December 2022.  He has had a rather complicated course with recurrent pancreatitis as well as necrotizing pancreatitis requiring drainage recently.  He has had multiple aspirations of pancreatic fluid collections. CT  scan on 04/28/2021 demonstrated large circumferential pericardial effusion.    Assessment & Plan   #Large pericardial effusion with pericardial tamponade #Hypotension #Cardiogenic shock - s/p pericardial drain placement 02/19>>02/22 w/ resolution of the effusion - no chest pain, if has, rx for pericarditis - MD advise when next echo should be performed - exudative effusion by LDL, pericardial fluid grew Gm+ cocci, enterococcus faecium - ABX per IM/ID  #Sinus tachycardia: ddx includes dehydration/sepsis - HR slightly improved, follow  #Necrotizing pancreatitis #Sepsis - rx per  IM/ID - CRP trending down WBCs trending up, recheck in am  #Anemia - transfused on 02/20 - stable last 2 days  #History of PE - restart anticoag 02/24  #Stroke hx: - continue to hold plavix until 2/24   For questions or updates, please contact [place] Please consult [email or web address] for contact info under     Rosaria Ferries, PA-C

## 2021-05-06 NOTE — Progress Notes (Signed)
PROGRESS NOTE    Scott Foley  WCB:[id number] DOB: [birth year] DOA: 04/13/2021 PCP: [name], MD   Chief Complaint  Patient presents with   Abdominal Pain    Brief Narrative:    56 year old male with prior history of coronary artery disease, hypertension, history of pulmonary embolism on Eliquis, hyperlipidemia, CVA in 2014, COVID-19 infection, sepsis secondary to bacteremia from Klebsiella pneumonia, recurrent pancreatitis presents with worsening abdominal pain.  Of note patient underwent cholecystectomy on 12/21, ERCP on 12/22.  Post ERCP patient apparently developed pancreatitis.  He was managed conservatively and discharged home.  This admission patient had a CT done which showed pancreatic necrosis and fluid collections.  IR consulted and he underwent aspiration of the fluid collection on 04/18/2021.  Repeat CT showed stable findings.  He was also incidentally found to have significant pericardial effusion concerning for tamponade.  Cardiology was consulted and underwent pericardiocentesis on 05/01/2021.  He underwent 1 unit of PRBC transfusion on 05/02/2021. The pericardial catheter removed on 2/22. Abdominal IR catheter in place with minimal input.  Plan to restart his anticoagulation today and monitor overnight and repeat echo on Monday.   Assessment & Plan:   Principal Problem:   Necrotizing pancreatitis Active Problems:   Coronary artery disease with stable angina pectoris (HCC)   Hyperlipemia   Chronic obstructive pulmonary disease (HCC)   Essential hypertension   History of pulmonary embolism   Sepsis ([place])   AKI (acute kidney injury) ([place])   Hypokalemia   Chronic respiratory failure with hypoxia (HCC)   Normocytic anemia   Hypoglycemia   Protein-calorie malnutrition, severe   Pericardial effusion   Pericardial tamponade   Necrotizing pancreatitis present on admission Patient was seen by GI and general surgery and have signed off.  IR consulted and patient  underwent percutaneous drainage on 2/6.  Cultures growing klebsiella pneumonia and citrobacter freundii.  Repeat CT on 2/16 shows stable findings. Abd drain in place and not much drainage overnight. Will need a repeat CT abdomen and pelvis prior to the removal of the drain vs outpatient CT. Will defer to IR for the repeat imaging.  Pt reports some soreness in the abd at the site of the catheter.     Pericardial effusion impending cardiac tamponade CT of the abdomen showed incidental finding of a significant pericardial effusion with concern for tamponade. Patient underwent urgent pericardiocentesis on 2/19 and was found to have a bloody fluid. His anticoagulation has been discontinued. Cardiology recommends holding AC till 2.24. Repeat echocardiogram on 2/20 showed trivial pericardial effusion. Pericardial drain was removed on 2/22. Pericardial fluid showing enterococcus Faecium, Faecalis and VRE, pt currently on IV meropenam and IV daptomycin. ID on board and following.  Will need recommendations on the duration of the IV Antibiotics.     Sepsis present on admission Appears to have resolved. Blood cultures have been negative so far    Chronic respiratory failure with hypoxia present on admission Patient on 2 to 3 L of nasal cannula oxygen to keep sats greater than 90%.     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  Restart eliquis today and monitor.    Hypertension Blood pressure parameters are optimal.    Hyperlipidemia Statin on hold.    Coronary artery disease Restart the plavix and statin .  Pt denies any chest pain.    Severe protein calorie malnutrition Encourage oral intake.   Acute  kidney injury Presented with a creatinine of 2.4 Patient was aggressively hydrated.  Creatinine back to baseline.   Anemia of chronic disease Hemoglobin around 9.   Continue to monitor.  Hyponatremia:  Improved to 131.    DVT prophylaxis: SCD's Code Status: full code.  Family Communication: family at bedside.  Disposition:   Status is: Inpatient  Remains inpatient appropriate because: IV antibiotics.      Consultants:  Cardiology.   Gastroenterology.  Interventional radiology.  General surgery.  Infectious disease   Procedures:  Drainage of fluid collection in the abdomen by interventional radiology on 2/6   Transthoracic echocardiogram 2/17   Pericardiocentesis 2/19.    Antimicrobials:  Antibiotics Given (last 72 hours)     Date/Time Action Medication Dose Rate   05/03/21 1637 New Bag/Given   meropenem (MERREM) 1 g in sodium chloride 0.9 % 100 mL IVPB 1 g 200 mL/hr   05/04/21 0000 New Bag/Given   meropenem (MERREM) 1 g in sodium chloride 0.9 % 100 mL IVPB 1 g 200 mL/hr   05/04/21 0748 New Bag/Given   meropenem (MERREM) 1 g in sodium chloride 0.9 % 100 mL IVPB 1 g 200 mL/hr   05/04/21 1604 New Bag/Given   meropenem (MERREM) 1 g in sodium chloride 0.9 % 100 mL IVPB 1 g 200 mL/hr   05/04/21 2114 New Bag/Given   DAPTOmycin (CUBICIN) 700 mg in sodium chloride 0.9 % IVPB 700 mg 128 mL/hr   05/05/21 0019 New Bag/Given   meropenem (MERREM) 1 g in sodium chloride 0.9 % 100 mL IVPB 1 g 200 mL/hr   05/05/21 8546 New Bag/Given   meropenem (MERREM) 1 g in sodium chloride 0.9 % 100 mL IVPB 1 g 200 mL/hr   05/05/21 1603 New Bag/Given   meropenem (MERREM) 1 g in sodium chloride 0.9 % 100 mL IVPB 1 g 200 mL/hr   05/05/21 1941 New Bag/Given   DAPTOmycin (CUBICIN) 700 mg in sodium chloride 0.9 % IVPB 700 mg 128 mL/hr   05/06/21 0008 New Bag/Given   meropenem (MERREM) 1 g in sodium chloride 0.9 % 100 mL IVPB 1 g 200 mL/hr   05/06/21 1025 New Bag/Given   meropenem (MERREM) 1 g in sodium chloride 0.9 % 100 mL IVPB 1 g 200 mL/hr        Subjective: Pt denies any chest pain or sob.   Objective: Vitals:   05/06/21 0436 05/06/21 0443  05/06/21 0742 05/06/21 1238  BP: 115/81  122/84 118/86  Pulse: 97  (!) 106 (!) 102  Resp: 20  20 20   Temp: 97.9 F (36.6 C)  97.6 F (36.4 C) 97.8 F (36.6 C)  TempSrc: Oral  Oral Oral  SpO2: 94%  93% 99%  Weight:  82.3 kg    Height:        Intake/Output Summary (Last 24 hours) at 05/06/2021 1351 Last data filed at 05/06/2021 0742 Gross per 24 hour  Intake 1192.9 ml  Output 875 ml  Net 317.9 ml    Filed Weights   05/04/21 0500 05/05/21 0500 05/06/21 0443  Weight: 82.8 kg 83 kg 82.3 kg    Examination:  General exam: Appears calm and comfortable  Respiratory system: Clear to auscultation. Respiratory effort normal. Cardiovascular system: S1 & S2 heard, tachycardic,  No JVD,  No pedal edema. Gastrointestinal system: Abdomen is nondistended, soft and nontender.  IR Drain with  white colored fluid . Normal bowel sounds heard. Central nervous system: Alert and oriented. No focal neurological deficits.  Extremities: Symmetric 5 x 5 power. Skin: No rashes, lesions or ulcers Psychiatry:  Mood & affect appropriate.       Data Reviewed: I have personally reviewed following labs and imaging studies  CBC: Recent Labs  Lab 05/01/21 0220 05/02/21 0120 05/02/21 1610 05/03/21 0007 05/04/21 0232 05/05/21 0321  WBC 15.1* 13.0*  --  15.7* 16.3* 17.5*  HGB 7.8* 7.1* 10.0* 9.9* 9.4* 9.3*  HCT 24.2* 22.3* 28.7* 28.8* 27.9* 27.4*  MCV 87.1 86.1  --  84.2 84.3 85.4  PLT 461* 383  --  376 354 334     Basic Metabolic Panel: Recent Labs  Lab 05/01/21 0220 05/02/21 0120 05/03/21 0007 05/04/21 0232 05/05/21 0321 05/06/21 0200  NA 130* 131* 129* 131* 129* 131*  K 5.0 3.9 3.3* 3.6 4.0 3.8  CL 97* 99 95* 98 97* 100  CO2 19* 21* 25 24 22 23   GLUCOSE 110* 90 95 96 97 97  BUN 37* 41* 27* 26* 23* 23*  CREATININE 1.17 1.11 0.98 0.94 1.04 0.89  CALCIUM 9.5 8.6* 8.7* 8.6* 8.6* 8.7*  MG 2.4  --   --  1.7  --   --      GFR: Estimated Creatinine Clearance: 88.6 mL/min (by C-G  formula based on SCr of 0.89 mg/dL).  Liver Function Tests: No results for input(s): AST, ALT, ALKPHOS, BILITOT, PROT, ALBUMIN in the last 168 hours.  CBG: Recent Labs  Lab 05/05/21 0620 05/05/21 1154 05/05/21 1720 05/05/21 2033 05/06/21 0622  GLUCAP 105* 111* 105* 116* 93      Recent Results (from the past 240 hour(s))  Surgical PCR screen     Status: None   Collection Time: 05/01/21 11:34 AM   Specimen: Nasal Mucosa; Nasal Swab  Result Value Ref Range Status   MRSA, PCR NEGATIVE NEGATIVE Final   Staphylococcus aureus NEGATIVE NEGATIVE Final    Comment: (NOTE) The Xpert SA Assay (FDA approved for NASAL specimens in patients 82 years of age and older), is one component of a comprehensive surveillance program. It is not intended to diagnose infection nor to guide or monitor treatment. Performed at [facility], [place] [address], [place], [place] [address]   Culture, body fluid w Gram Stain-bottle     Status: Abnormal   Collection Time: 05/01/21 12:42 PM   Specimen: Pericardial  Result Value Ref Range Status   Specimen Description PERICARDIAL FLUID  Final   Special Requests SPEC A  Final   Gram Stain   Final    GRAM POSITIVE COCCI IN BOTH AEROBIC AND ANAEROBIC BOTTLES CRITICAL RESULT CALLED TO, READ BACK BY AND VERIFIED WITH: T ARMSTRONG,RN @0300  05/02/21 [place] Performed at [facility], [place] [address], [place], [place] [address]    Culture (A)  Final    ENTEROCOCCUS FAECIUM VANCOMYCIN RESISTANT ENTEROCOCCUS ENTEROCOCCUS FAECALIS    Report Status 05/06/2021 FINAL  Final   Organism ID, Bacteria ENTEROCOCCUS FAECIUM  Final   Organism ID, Bacteria ENTEROCOCCUS FAECALIS  Final      Susceptibility   Enterococcus faecalis - MIC*    AMPICILLIN <=2 SENSITIVE Sensitive     VANCOMYCIN 1 SENSITIVE Sensitive     GENTAMICIN SYNERGY SENSITIVE Sensitive     * ENTEROCOCCUS FAECALIS   Enterococcus faecium - MIC*    AMPICILLIN >=32 RESISTANT Resistant     VANCOMYCIN  >=32 RESISTANT Resistant     GENTAMICIN SYNERGY SENSITIVE Sensitive     LINEZOLID 2 SENSITIVE Sensitive     * ENTEROCOCCUS  FAECIUM  Gram stain     Status: None   Collection Time: 05/01/21 12:42 PM   Specimen: Pericardial  Result Value Ref Range Status   Specimen Description PERICARDIAL FLUID  Final   Special Requests SPEC A  Final   Gram Stain   Final    FEW WBC PRESENT,BOTH PMN AND MONONUCLEAR NO ORGANISMS SEEN Performed at [facility], [address] [place], [place] [address]    Report Status 05/01/2021 FINAL  Final          Radiology Studies: No results found.      Scheduled Meds:  apixaban  5 mg Oral BID   clopidogrel  75 mg Oral Daily   feeding supplement  237 mL Oral BID BM   fluticasone furoate-vilanterol  1 puff Inhalation Daily   And   umeclidinium bromide  1 puff Inhalation Daily   metoprolol tartrate  25 mg Oral BID   multivitamin with minerals  1 tablet Oral Daily   pantoprazole  40 mg Oral BID   sodium chloride flush  5 mL Intracatheter Q8H   vitamin B-12  1,000 mcg Oral Daily   Continuous Infusions:  sodium chloride     DAPTOmycin (CUBICIN)  IV Stopped (05/05/21 2011)   meropenem (MERREM) IV 1 g (05/06/21 1025)     LOS: 23 days        Hosie Poisson, MD [facility]   To contact the attending provider between 7A-7P or the covering provider during after hours 7P-7A, please log into the web site [email or web address] and access using universal [place] password for that web site. If you do not have the password, please call the hospital operator.  05/06/2021, 1:51 PM

## 2021-05-06 NOTE — Progress Notes (Addendum)
Pharmacy Antibiotic Note  Scott Foley is a 56 y.o. male admitted on 04/13/2021 with E faecium in pericardial fluid and Kleb pneumo and citrobacter fruendii in pancreatic abscess. Pharmacy has been consulted for Daptomycin and Meropenem dosing.  SCr stable, CK 7.  Plan: Continue Meropenem 1gm IV q8h Continue Daptomycin 700 mg (~10 mg/kg AdjBW) every 24 hours Will continue to follow renal function, culture results, LOT, and antibiotic de-escalation plans   Height: 5\' 4"  (162.6 cm) Weight: 82.3 kg (181 lb 7 oz) IBW/kg (Calculated) : 59.2  Temp (24hrs), Avg:98.2 F (36.8 C), Min:97.6 F (36.4 C), Max:98.6 F (37 C)  Recent Labs  Lab 05/01/21 0220 05/02/21 0120 05/03/21 0007 05/04/21 0232 05/05/21 0321 05/06/21 0200  WBC 15.1* 13.0* 15.7* 16.3* 17.5*  --   CREATININE 1.17 1.11 0.98 0.94 1.04 0.89     Estimated Creatinine Clearance: 88.6 mL/min (by C-G formula based on SCr of 0.89 mg/dL).    Allergies  Allergen Reactions   Penicillin G Hives    Antimicrobials this admission: Meropenem 2/1 >> Daptomycin 2/21 >>  Microbiology results: 2/1 BCx >> NGf 2/1 UCx >> NGf 2/6 Abscess cx >> Kleb PNA + Citrobacter freundii 2/19 pericardial fluid >> Enterococcus faecium  Thank you for allowing pharmacy to be a part of this patients care.  Sherlon Handing, PharmD, BCPS Please see amion for complete clinical pharmacist phone list 05/06/2021 8:59 AM

## 2021-05-06 NOTE — Progress Notes (Signed)
[facility]  Date of Admission:  04/13/2021           Reason for visit: Follow up on necrotizing pancreatitis with abscess, pericardial effusion  Current antibiotics: Meropenem Daptomycin  ASSESSMENT:    56 y.o. male admitted with:  Necrotizing pancreatitis with abscess formation: Status post IR drain placement 04/18/2021.  Cultures isolated Klebsiella pneumonia and Citrobacter freundii.  Currently on meropenem with 24-hour drain output charted to 0 cc.  Awaiting IR reevaluation to determine next steps in terms of imaging. Pericardial effusion and tamponade: Status post pericardiocentesis and pericardial drain placement 05/01/2021.  This drain was removed on 2/22.  Planning for repeat echocardiogram possibly Monday.  This effusion was thought to be from chronic continuous inflammation tracking into the pericardial space from his pancreatitis as well as possible bleeding into the space from his anticoagulated state.  Cultures have grown VRE and Enterococcus faecalis.  RECOMMENDATIONS:    Continue meropenem and daptomycin Monitor IR drain output and plans for repeat imaging Lab monitoring Will follow.  Dr Baxter Flattery available as needed over the weekend.   Principal Problem:   Necrotizing pancreatitis Active Problems:   Coronary artery disease with stable angina pectoris (HCC)   Hyperlipemia   Chronic obstructive pulmonary disease (HCC)   Essential hypertension   History of pulmonary embolism   Sepsis ([place])   AKI (acute kidney injury) ([place])   Hypokalemia   Chronic respiratory failure with hypoxia (HCC)   Normocytic anemia   Hypoglycemia   Protein-calorie malnutrition, severe   Pericardial effusion   Pericardial tamponade    MEDICATIONS:    Scheduled Meds:  apixaban  5 mg Oral BID   clopidogrel  75 mg Oral Daily   feeding supplement  237 mL Oral BID BM   fluticasone furoate-vilanterol  1 puff Inhalation Daily   And   umeclidinium bromide  1  puff Inhalation Daily   metoprolol tartrate  25 mg Oral BID   multivitamin with minerals  1 tablet Oral Daily   pantoprazole  40 mg Oral BID   sodium chloride flush  5 mL Intracatheter Q8H   vitamin B-12  1,000 mcg Oral Daily   Continuous Infusions:  sodium chloride     DAPTOmycin (CUBICIN)  IV Stopped (05/05/21 2011)   meropenem (MERREM) IV 1 g (05/06/21 1025)   PRN Meds:.acetaminophen, albuterol, morphine injection, ondansetron **OR** ondansetron (ZOFRAN) IV, oxyCODONE  SUBJECTIVE:   24 hour events:  No acute events noted Transferred from 2 heart to 4 E. No new cultures Creatinine stable No new imaging   Patient seen at the bedside with his wife.  He is sitting up in the chair and in no acute distress.  He reports no fevers or chills.  States that he has not had any worsening breathing, shortness of breath, chest pain since his pericardial drain was removed.  Review of Systems  All other systems reviewed and are negative.    OBJECTIVE:   Blood pressure 122/84, pulse (!) 106, temperature 97.6 F (36.4 C), temperature source Oral, resp. rate 20, height 5\' 4"  (1.626 m), weight 82.3 kg, SpO2 93 %. Body mass index is 31.14 kg/m.  Physical Exam Constitutional:      General: He is not in acute distress.    Appearance: Normal appearance.  HENT:     Head: Normocephalic and atraumatic.  Eyes:     Extraocular Movements: Extraocular movements intact.     Conjunctiva/sclera: Conjunctivae normal.  Pulmonary:  Effort: Pulmonary effort is normal. No respiratory distress.  Abdominal:     General: There is no distension.     Palpations: Abdomen is soft.  Musculoskeletal:        General: Normal range of motion.     Cervical back: Normal range of motion and neck supple.  Skin:    General: Skin is warm and dry.     Findings: No rash.  Neurological:     General: No focal deficit present.     Mental Status: He is alert and oriented to person, place, and time.  Psychiatric:         Mood and Affect: Mood normal.        Behavior: Behavior normal.     Lab Results: Lab Results  Component Value Date   WBC 17.5 (H) 05/05/2021   HGB 9.3 (L) 05/05/2021   HCT 27.4 (L) 05/05/2021   MCV 85.4 05/05/2021   PLT 334 05/05/2021    Lab Results  Component Value Date   NA 131 (L) 05/06/2021   K 3.8 05/06/2021   CO2 23 05/06/2021   GLUCOSE 97 05/06/2021   BUN 23 (H) 05/06/2021   CREATININE 0.89 05/06/2021   CALCIUM 8.7 (L) 05/06/2021   GFRNONAA >60 05/06/2021   GFRAA 105 12/15/2019    Lab Results  Component Value Date   ALT 17 04/18/2021   AST 24 04/18/2021   ALKPHOS 77 04/18/2021   BILITOT 0.4 04/18/2021       Component Value Date/Time   CRP 13.0 (H) 05/04/2021 1241    No results found for: ESRSEDRATE   I have reviewed the micro and lab results in Epic.  Imaging: No results found.   Imaging  independently reviewed in Epic.    Raynelle Highland for [facility] [phone] pager 05/06/2021, 12:19 PM

## 2021-05-06 NOTE — TOC Initial Note (Signed)
Transition of Care (TOC) - Initial/Assessment Note  Marvetta Gibbons RN, BSN Transitions of Care Unit 4E- RN Case Manager See Treatment Team for direct phone #    Patient Details  Name: Scott Foley MRN: [id number] Date of Birth: 1965/04/08  Transition of Care [facility]) CM/SW Contact:    Dawayne Patricia, RN Phone Number: 05/06/2021, 4:46 PM  Clinical Narrative:                 Pt from home w/ wife. Orders placed for HHPT/OT. CM into speak with pt at bedside to discuss transition needs. Choice offered with listPer CMS guidelines from medicare.gov website with star ratings (copy placed in shadow chart). Pt states he does not really want in home services and would rather go to outpt therapy. Asked pt if he had a location in mind, and pt states he would like to go to [facility] on [place].  Discussed any DME needs- pt declines DME - no needs noted at this time.   Call made to [facility]- spoke with [place] - she will fax Rx that can be filled out for outpt therapy needs w/ them. Pt can either bring with him Rx or it can be faxed back.  Rx received and placed on shadow chart for MD to sign- msg sent to MD regarding signature needed on outpt RX for outpt therapy- as per pt request.   Will have weekend CM f/u.     Expected Discharge Plan: [place] Barriers to Discharge: Continued Medical Work up   Patient Goals and CMS Choice Patient states their goals for this hospitalization and ongoing recovery are:: return home CMS Medicare.gov Compare Post Acute Care list provided to:: Patient Choice offered to / list presented to : Patient  Expected Discharge Plan and Services Expected Discharge Plan: [place]   Discharge Planning Services: CM Consult Post Acute Care Choice: Home Health, Durable Medical Equipment Living arrangements for the past 2 months: Single Family Home                            HH Arranged: PT, OT, Patient Refused HH (requesting outpt  therapy)          Prior Living Arrangements/Services Living arrangements for the past 2 months: Single Family Home Lives with:: Spouse Patient language and need for interpreter reviewed:: Yes Do you feel safe going back to the place where you live?: Yes      Need for Family Participation in Patient Care: Yes (Comment) Care giver support system in place?: Yes (comment)   Criminal Activity/Legal Involvement Pertinent to Current Situation/Hospitalization: No - Comment as needed  Activities of Daily Living Home Assistive Devices/Equipment: Wheelchair ADL Screening (condition at time of admission) Patient's cognitive ability adequate to safely complete daily activities?: Yes Is the patient deaf or have difficulty hearing?: No Does the patient have difficulty seeing, even when wearing glasses/contacts?: No Does the patient have difficulty concentrating, remembering, or making decisions?: No Patient able to express need for assistance with ADLs?: Yes Does the patient have difficulty dressing or bathing?: Yes Independently performs ADLs?: Yes (appropriate for developmental age) Does the patient have difficulty walking or climbing stairs?: No Weakness of Legs: Both Weakness of Arms/Hands: Both  Permission Sought/Granted Permission sought to share information with : Facility Art therapist granted to share information with : Yes, Verbal Permission Granted  Permission granted to share info w AGENCY: outpt        Emotional Assessment Appearance:: Appears stated age Attitude/Demeanor/Rapport: Engaged Affect (typically observed): Accepting, Appropriate, Pleasant Orientation: : Oriented to Self, Oriented to Place, Oriented to  Time, Oriented to Situation Alcohol / Substance Use: Not Applicable Psych Involvement: No (comment)  Admission diagnosis:  Hypokalemia [E87.6] Necrotizing  pancreatitis [K85.91] Sepsis ([place]) [A41.9] Sepsis with acute renal failure without septic shock, due to unspecified organism, unspecified acute renal failure type ([place]) [A41.9, R65.20, N17.9] Patient Active Problem List   Diagnosis Date Noted   Pericardial tamponade    Pericardial effusion 04/29/2021   Normocytic anemia 04/15/2021   Hypoglycemia 04/15/2021   Protein-calorie malnutrition, severe 04/15/2021   Sepsis ([place]) 04/13/2021   AKI (acute kidney injury) ([place]) 04/13/2021   Hypokalemia 04/13/2021   Necrotizing pancreatitis 04/13/2021   Chronic respiratory failure with hypoxia ([place]) 04/13/2021   Essential hypertension 02/15/2021   History of pulmonary embolism 02/15/2021   Chest pain 02/09/2021   Hypertensive heart disease without congestive heart failure 12/06/2020   Acquired thrombophilia ([place]) 12/06/2020   Morbid obesity ([place]) 12/06/2020   Colon polyp 10/25/2020   [place], post-stroke 07/28/2019   DNR (do not resuscitate) 03/03/2019   Chronic obstructive pulmonary disease ([place]) 07/16/2018   Closed rib fracture right 9th and 10th 07/16/2018   Pulmonary nodules/lesions, multiple 11/25/2012   Coronary artery disease with stable angina pectoris ([place])    Hyperlipemia    PCP:  [name], MD Pharmacy:   [facility] [address] [place] [address] [place] [address] Phone: [phone] Fax: [phone]  [id number] [place], [address] [place] [address] Phone: [phone] Fax: [phone]     Social Determinants of Health (SDOH) Interventions    Readmission Risk Interventions No flowsheet data found.

## 2021-05-07 DIAGNOSIS — I3139 Other pericardial effusion (noninflammatory): Secondary | ICD-10-CM | POA: Diagnosis not present

## 2021-05-07 DIAGNOSIS — I10 Essential (primary) hypertension: Secondary | ICD-10-CM | POA: Diagnosis not present

## 2021-05-07 DIAGNOSIS — J9611 Chronic respiratory failure with hypoxia: Secondary | ICD-10-CM | POA: Diagnosis not present

## 2021-05-07 DIAGNOSIS — I25118 Atherosclerotic heart disease of native coronary artery with other forms of angina pectoris: Secondary | ICD-10-CM

## 2021-05-07 LAB — CBC WITH DIFFERENTIAL/PLATELET
Abs Immature Granulocytes: 0.18 10*3/uL — ABNORMAL HIGH (ref 0.00–0.07)
Basophils Absolute: 0.1 10*3/uL (ref 0.0–0.1)
Basophils Relative: 0 %
Eosinophils Absolute: 0.4 10*3/uL (ref 0.0–0.5)
Eosinophils Relative: 3 %
HCT: 31.3 % — ABNORMAL LOW (ref 39.0–52.0)
Hemoglobin: 10.3 g/dL — ABNORMAL LOW (ref 13.0–17.0)
Immature Granulocytes: 1 %
Lymphocytes Relative: 20 %
Lymphs Abs: 3.3 10*3/uL (ref 0.7–4.0)
MCH: 27.9 pg (ref 26.0–34.0)
MCHC: 32.9 g/dL (ref 30.0–36.0)
MCV: 84.8 fL (ref 80.0–100.0)
Monocytes Absolute: 1.5 10*3/uL — ABNORMAL HIGH (ref 0.1–1.0)
Monocytes Relative: 9 %
Neutro Abs: 11.4 10*3/uL — ABNORMAL HIGH (ref 1.7–7.7)
Neutrophils Relative %: 67 %
Platelets: 399 10*3/uL (ref 150–400)
RBC: 3.69 MIL/uL — ABNORMAL LOW (ref 4.22–5.81)
RDW: 16.8 % — ABNORMAL HIGH (ref 11.5–15.5)
WBC: 16.9 10*3/uL — ABNORMAL HIGH (ref 4.0–10.5)
nRBC: 0 % (ref 0.0–0.2)

## 2021-05-07 LAB — BASIC METABOLIC PANEL
Anion gap: 9 (ref 5–15)
BUN: 19 mg/dL (ref 6–20)
CO2: 24 mmol/L (ref 22–32)
Calcium: 9.3 mg/dL (ref 8.9–10.3)
Chloride: 101 mmol/L (ref 98–111)
Creatinine, Ser: 1.05 mg/dL (ref 0.61–1.24)
GFR, Estimated: >60 mL/min (ref >60)
Glucose, Bld: 90 mg/dL (ref 70–99)
Potassium: 4.2 mmol/L (ref 3.5–5.1)
Sodium: 134 mmol/L — ABNORMAL LOW (ref 135–145)

## 2021-05-07 LAB — CK: Total CK: <5 U/L — ABNORMAL LOW (ref 49–397)

## 2021-05-07 MED ORDER — METOPROLOL TARTRATE 25 MG PO TABS
37.5000 mg | ORAL_TABLET | Freq: Two times a day (BID) | ORAL | Status: DC
  Administered 2021-05-07 – 2021-05-10 (×7): 37.5 mg via ORAL
  Filled 2021-05-07 (×7): qty 1

## 2021-05-07 NOTE — Care Management (Signed)
Faxed referral to [facility] for outpatient PT. Original sent home with patient, and copy placed in paper chart

## 2021-05-07 NOTE — Progress Notes (Addendum)
Cardiology Progress Note  Patient ID: Scott Foley MRN: [id number] DOB: 12-20-1965 Date of Encounter: 05/07/2021  Primary Cardiologist: Sanda Klein, MD  Subjective   Denies any chest pain or SOB.  Tele in NSR to sinus tachycardia   ROS:  All other ROS reviewed and negative. Pertinent positives noted in the HPI.     Inpatient Medications  Scheduled Meds:  apixaban  5 mg Oral BID   clopidogrel  75 mg Oral Daily   feeding supplement  237 mL Oral BID BM   fluticasone furoate-vilanterol  1 puff Inhalation Daily   And   umeclidinium bromide  1 puff Inhalation Daily   metoprolol tartrate  37.5 mg Oral BID   multivitamin with minerals  1 tablet Oral Daily   pantoprazole  40 mg Oral BID   sodium chloride flush  5 mL Intracatheter Q8H   vitamin B-12  1,000 mcg Oral Daily   Continuous Infusions:  sodium chloride     DAPTOmycin (CUBICIN)  IV Stopped (05/06/21 2031)   meropenem (MERREM) IV 1 g (05/07/21 0953)   PRN Meds: acetaminophen, albuterol, morphine injection, ondansetron **OR** ondansetron (ZOFRAN) IV, oxyCODONE   Vital Signs   Vitals:   05/07/21 0355 05/07/21 0400 05/07/21 0739 05/07/21 0746  BP: (!) 137/91  (!) 144/99   Pulse: 100  97   Resp: 18  19   Temp: 97.8 F (36.6 C)  97.8 F (36.6 C)   TempSrc: Oral  Oral   SpO2: 92%  99% 92%  Weight:  80.7 kg    Height:        Intake/Output Summary (Last 24 hours) at 05/07/2021 1005 Last data filed at 05/07/2021 0600 Gross per 24 hour  Intake 479.07 ml  Output 1150 ml  Net -670.93 ml   Last 3 Weights 05/07/2021 05/06/2021 05/05/2021  Weight (lbs) 177 lb 14.6 oz 181 lb 7 oz 182 lb 15.7 oz  Weight (kg) 80.7 kg 82.3 kg 83 kg      Telemetry   NSR -  Personally reviewed  Physical Exam   Vitals:   05/07/21 0355 05/07/21 0400 05/07/21 0739 05/07/21 0746  BP: (!) 137/91  (!) 144/99   Pulse: 100  97   Resp: 18  19   Temp: 97.8 F (36.6 C)  97.8 F (36.6 C)   TempSrc: Oral  Oral   SpO2: 92%  99% 92%   Weight:  80.7 kg    Height:        Intake/Output Summary (Last 24 hours) at 05/07/2021 1005 Last data filed at 05/07/2021 0600 Gross per 24 hour  Intake 479.07 ml  Output 1150 ml  Net -670.93 ml    Last 3 Weights 05/07/2021 05/06/2021 05/05/2021  Weight (lbs) 177 lb 14.6 oz 181 lb 7 oz 182 lb 15.7 oz  Weight (kg) 80.7 kg 82.3 kg 83 kg    Body mass index is 30.54 kg/m.   GEN: Well nourished, well developed in no acute distress HEENT: Normal NECK: No JVD; No carotid bruits LYMPHATICS: No lymphadenopathy CARDIAC:RRR, no murmurs, rubs, gallops RESPIRATORY:  Clear to auscultation without rales, wheezing or rhonchi  ABDOMEN: Soft, non-tender, non-distended MUSCULOSKELETAL:  No edema; No deformity  SKIN: Warm and dry NEUROLOGIC:  Alert and oriented x 3 PSYCHIATRIC:  Normal affect   Labs  High Sensitivity Troponin:  No results for input(s): TROPONINIHS in the last 720 hours.   Cardiac EnzymesNo results for input(s): TROPONINI in the last 168 hours. No results for input(s): TROPIPOC in  the last 168 hours.  Chemistry Recent Labs  Lab 05/05/21 0321 05/06/21 0200 05/07/21 0357  NA 129* 131* 134*  K 4.0 3.8 4.2  CL 97* 100 101  CO2 22 23 24   GLUCOSE 97 97 90  BUN 23* 23* 19  CREATININE 1.04 0.89 1.05  CALCIUM 8.6* 8.7* 9.3  GFRNONAA >60 >60 >60  ANIONGAP 10 8 9     Hematology Recent Labs  Lab 05/04/21 0232 05/05/21 0321 05/07/21 0357  WBC 16.3* 17.5* 16.9*  RBC 3.31* 3.21* 3.69*  HGB 9.4* 9.3* 10.3*  HCT 27.9* 27.4* 31.3*  MCV 84.3 85.4 84.8  MCH 28.4 29.0 27.9  MCHC 33.7 33.9 32.9  RDW 16.4* 16.9* 16.8*  PLT 354 334 399   BNPNo results for input(s): BNP, PROBNP in the last 168 hours.   Lab Results  Component Value Date   CKTOTAL <5 (L) 05/07/2021        Component Value Date/Time   CRP 12.7 (H) 05/06/2021 1137   CRP 13.0 (H) 05/04/2021 1241   CRP 17.6 (H) 05/02/2021 1004     Radiology  No results found.  Cardiac Studies   ECHO: LIMITED 05/02/2021  1.  Left ventricular ejection fraction, by estimation, is 55 to 60%. The  left ventricle has normal function.   2. The moderate pericardial effusion has been tapped . The remaining  pericardial effusion is now trivial.   3. The mitral valve is grossly normal. Trivial mitral valve  regurgitation.   TTE 04/30/2021  1. Respiratory variation is 34% across the MV. The RA is not well  visualized. There is evidence of early RV diastolic collapse. Similar  findings of tamponade physiology to 04/28/2021 echo. Chart reviewed,  hemodynamics stable since last echo. Appears plan   has been close inpatient monitoring, potential pericardiocentesis if  becomes clinically indicated. . Large pericardial effusion. The  pericardial effusion is circumferential.   2. The inferior vena cava is dilated in size with <50% respiratory  variability, suggesting right atrial pressure of 15 mmHg.   3. Limited echo to evaluate pericardial effusion   Patient Profile  Scott Foley is a 56 y.o. male with CAD, hypertension, PE on Eliquis, stroke, iron deficiency anemia, admitted on 04/13/2021 with severe acute pancreatitis with sepsis.    Cardiology was consulted on 04/28/2021 for large pericardial effusion seen on CT scan.  Pericardiocentesis was delayed due to necrotizing pancreatitis.  Evaluation on 05/01/2021 shows worsening hypotension and tachycardia concerning for pericardial tamponade.  Currently admitted to the hospital with complications of necrotizing pancreatitis.  He developed pancreatitis post ERCP from ascending cholangitis in December 2022.  He has had a rather complicated course with recurrent pancreatitis as well as necrotizing pancreatitis requiring drainage recently.  He has had multiple aspirations of pancreatic fluid collections. CT scan on 04/28/2021 demonstrated large circumferential pericardial effusion.    Assessment & Plan   #Large pericardial effusion with pericardial  tamponade #Hypotension #Cardiogenic shock - s/p pericardial drain placement 02/19>>02/22 w/ resolution of the effusion - exudative effusion by LDL, pericardial fluid grew Gm+ cocci, enterococcus faecium - ABX per IM/ID - pericardial drain removed and VSS - repeat limited echo on Monday  #Sinus tachycardia:  - ddx includes dehydration/sepsis - HR improved and in the low 90's today  #Necrotizing pancreatitis #Sepsis - rx per IM/ID - CRPa nd WBC trending down   #Anemia - transfused on 02/20 - stable last 2 days  #History of PE - restarted Eliquis yesterday  #Stroke hx: - Plavix restarted  yesterday  #HTN -Borderline elevated DBP at 71mmhg -increase Lopressor to 37.5mg  BID    For questions or updates, please contact [facility] Please consult [email or web address] for contact info under     Fransico Him, MD

## 2021-05-07 NOTE — Progress Notes (Signed)
PROGRESS NOTE    Scott Foley  JJK:[id number] DOB: 07-11-1965 DOA: 04/13/2021 PCP: [name], MD   Chief Complaint  Patient presents with   Abdominal Pain    Brief Narrative:    56 year old male with prior history of coronary artery disease, hypertension, history of pulmonary embolism on Eliquis, hyperlipidemia, CVA in 2014, COVID-19 infection, sepsis secondary to bacteremia from Klebsiella pneumonia, recurrent pancreatitis presents with worsening abdominal pain.  Of note patient underwent cholecystectomy on 12/21, ERCP on 12/22.  Post ERCP patient apparently developed pancreatitis.  He was managed conservatively and discharged home.  This admission patient had a CT done which showed pancreatic necrosis and fluid collections.  IR consulted and he underwent aspiration of the fluid collection on 04/18/2021.  Repeat CT showed stable findings.  He was also incidentally found to have significant pericardial effusion concerning for tamponade.  Cardiology was consulted and underwent pericardiocentesis on 05/01/2021.  He underwent 1 unit of PRBC transfusion on 05/02/2021. The pericardial catheter removed on 2/22. Abdominal IR catheter in place with minimal input.  Plan to restart his anticoagulation today and monitor overnight and repeat echo on Monday.  Patient seen and examined today he reports 20 mL fluid drained overnight and 10 mL this morning.  Assessment & Plan:   Principal Problem:   Necrotizing pancreatitis Active Problems:   Coronary artery disease with stable angina pectoris (HCC)   Hyperlipemia   Chronic obstructive pulmonary disease (HCC)   Essential hypertension   History of pulmonary embolism   Sepsis ([place])   AKI (acute kidney injury) ([place])   Hypokalemia   Chronic respiratory failure with hypoxia (HCC)   Normocytic anemia   Hypoglycemia   Protein-calorie malnutrition, severe   Pericardial effusion   Pericardial tamponade   Necrotizing pancreatitis present on  admission Patient was seen by GI and general surgery and have signed off.  IR consulted and patient underwent percutaneous drainage on 2/6.  Cultures growing klebsiella pneumonia and citrobacter freundii.  Repeat CT on 2/16 shows stable findings. Abd drain in place and not much drainage overnight. Will need a repeat CT abdomen and pelvis prior to the removal of the drain vs outpatient CT. Will defer to IR for the repeat imaging.  Pt reports some soreness in the abd at the site of the catheter.  Waiting for ID for antibiotic duration  Pericardial effusion impending cardiac tamponade CT of the abdomen showed incidental finding of a significant pericardial effusion with concern for tamponade. Patient underwent urgent pericardiocentesis on 2/19 and was found to have a bloody fluid. His anticoagulation has been discontinued. Cardiology recommends holding AC till 2.24. Repeat echocardiogram on 2/20 showed trivial pericardial effusion.  Cardiology plans to repeat echocardiogram on Monday, 05/09/2021. Pericardial drain was removed on 2/22. Pericardial fluid showing enterococcus Faecium, Faecalis and VRE, pt currently on IV meropenam and IV daptomycin. ID on board and following.    Sepsis present on admission Appears to have resolved. Blood cultures have been negative so far.  Patient denies any fever or chills at this time    Chronic respiratory failure with hypoxia present on admission Patient has been weaned off oxygen at this time currently on room air with oxygen sats between 90 to 93%.   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  Restart eliquis today and monitor.    Hypertension Blood pressure parameters are well controlled   Hyperlipidemia Statin on hold.  Coronary artery disease Restart the plavix and statin .  Pt denies any chest pain.    Severe protein calorie  malnutrition Encourage oral intake.   Acute kidney injury Presented with a creatinine of 2.4 Patient was aggressively hydrated.  Creatinine back to baseline.   Anemia of chronic disease Hemoglobin around 9.  Continue to monitor.  Hyponatremia:  Sodium has improved to 134.   Persistent tachycardia at rest EKG shows sinus tachycardia. Patient was started on metoprolol 37.5 mg twice daily.   DVT prophylaxis: SCD's Code Status: full code.  Family Communication: family at bedside.  Disposition:   Status is: Inpatient  Remains inpatient appropriate because: IV antibiotics.      Consultants:  Cardiology.   Gastroenterology.  Interventional radiology.  General surgery.  Infectious disease   Procedures:  Drainage of fluid collection in the abdomen by interventional radiology on 2/6   Transthoracic echocardiogram 2/17   Pericardiocentesis 2/19.    Antimicrobials:  Antibiotics Given (last 72 hours)     Date/Time Action Medication Dose Rate   05/04/21 1604 New Bag/Given   meropenem (MERREM) 1 g in sodium chloride 0.9 % 100 mL IVPB 1 g 200 mL/hr   05/04/21 2114 New Bag/Given   DAPTOmycin (CUBICIN) 700 mg in sodium chloride 0.9 % IVPB 700 mg 128 mL/hr   05/05/21 0019 New Bag/Given   meropenem (MERREM) 1 g in sodium chloride 0.9 % 100 mL IVPB 1 g 200 mL/hr   05/05/21 7416 New Bag/Given   meropenem (MERREM) 1 g in sodium chloride 0.9 % 100 mL IVPB 1 g 200 mL/hr   05/05/21 1603 New Bag/Given   meropenem (MERREM) 1 g in sodium chloride 0.9 % 100 mL IVPB 1 g 200 mL/hr   05/05/21 1941 New Bag/Given   DAPTOmycin (CUBICIN) 700 mg in sodium chloride 0.9 % IVPB 700 mg 128 mL/hr   05/06/21 0008 New Bag/Given   meropenem (MERREM) 1 g in sodium chloride 0.9 % 100 mL IVPB 1 g 200 mL/hr   05/06/21 1025 New Bag/Given   meropenem (MERREM) 1 g in sodium chloride 0.9 % 100 mL IVPB 1 g 200 mL/hr   05/06/21 1659 New Bag/Given   meropenem (MERREM) 1 g in sodium chloride 0.9 % 100 mL  IVPB 1 g 200 mL/hr   05/06/21 2001 New Bag/Given   DAPTOmycin (CUBICIN) 700 mg in sodium chloride 0.9 % IVPB 700 mg 128 mL/hr   05/06/21 2328 New Bag/Given   meropenem (MERREM) 1 g in sodium chloride 0.9 % 100 mL IVPB 1 g 200 mL/hr   05/07/21 0953 New Bag/Given   meropenem (MERREM) 1 g in sodium chloride 0.9 % 100 mL IVPB 1 g 200 mL/hr        Subjective: Patient currently denies any new abdominal pain, nausea vomiting  Objective: Vitals:   05/07/21 0746 05/07/21 0956 05/07/21 1000 05/07/21 1204  BP:  127/85  (!) 140/91  Pulse:  (!) 111 (!) 108 (!) 103  Resp:  20 20 20   Temp:    (!) 100.7 F (38.2 C)  TempSrc:    Oral  SpO2: 92% 91% 95% 95%  Weight:      Height:        Intake/Output Summary (Last 24 hours) at 05/07/2021 1446 Last data filed at 05/07/2021 0900 Gross per 24 hour  Intake 719.07 ml  Output 1150 ml  Net -430.93 ml    Filed Weights   05/05/21 0500 05/06/21 0443 05/07/21 0400  Weight: 83 kg 82.3 kg  80.7 kg    Examination:  General exam: Appears calm and comfortable  Respiratory system: Clear to auscultation. Respiratory effort normal. Cardiovascular system: S1 & S2 heard, tachycardia. No JVD, No pedal edema. Gastrointestinal system: Abdomen is nondistended, soft and mildly tender at the site of the IR drain.  Normal bowel sounds heard. Central nervous system: Alert and oriented. No focal neurological deficits. Extremities: Symmetric 5 x 5 power. Skin: No rashes, lesions or ulcers Psychiatry: Mood & affect appropriate.        Data Reviewed: I have personally reviewed following labs and imaging studies  CBC: Recent Labs  Lab 05/02/21 0120 05/02/21 1610 05/03/21 0007 05/04/21 0232 05/05/21 0321 05/07/21 0357  WBC 13.0*  --  15.7* 16.3* 17.5* 16.9*  NEUTROABS  --   --   --   --   --  11.4*  HGB 7.1* 10.0* 9.9* 9.4* 9.3* 10.3*  HCT 22.3* 28.7* 28.8* 27.9* 27.4* 31.3*  MCV 86.1  --  84.2 84.3 85.4 84.8  PLT 383  --  376 354 334 399      Basic Metabolic Panel: Recent Labs  Lab 05/01/21 0220 05/02/21 0120 05/03/21 0007 05/04/21 0232 05/05/21 0321 05/06/21 0200 05/07/21 0357  NA 130*   < > 129* 131* 129* 131* 134*  K 5.0   < > 3.3* 3.6 4.0 3.8 4.2  CL 97*   < > 95* 98 97* 100 101  CO2 19*   < > 25 24 22 23 24   GLUCOSE 110*   < > 95 96 97 97 90  BUN 37*   < > 27* 26* 23* 23* 19  CREATININE 1.17   < > 0.98 0.94 1.04 0.89 1.05  CALCIUM 9.5   < > 8.7* 8.6* 8.6* 8.7* 9.3  MG 2.4  --   --  1.7  --   --   --    < > = values in this interval not displayed.     GFR: Estimated Creatinine Clearance: 74.4 mL/min (by C-G formula based on SCr of 1.05 mg/dL).  Liver Function Tests: No results for input(s): AST, ALT, ALKPHOS, BILITOT, PROT, ALBUMIN in the last 168 hours.  CBG: Recent Labs  Lab 05/05/21 0620 05/05/21 1154 05/05/21 1720 05/05/21 2033 05/06/21 0622  GLUCAP 105* 111* 105* 116* 93      Recent Results (from the past 240 hour(s))  Surgical PCR screen     Status: None   Collection Time: 05/01/21 11:34 AM   Specimen: Nasal Mucosa; Nasal Swab  Result Value Ref Range Status   MRSA, PCR NEGATIVE NEGATIVE Final   Staphylococcus aureus NEGATIVE NEGATIVE Final    Comment: (NOTE) The Xpert SA Assay (FDA approved for NASAL specimens in patients 63 years of age and older), is one component of a comprehensive surveillance program. It is not intended to diagnose infection nor to guide or monitor treatment. Performed at [facility], [place] [address], [place], [place] [address]   Culture, body fluid w Gram Stain-bottle     Status: Abnormal   Collection Time: 05/01/21 12:42 PM   Specimen: Pericardial  Result Value Ref Range Status   Specimen Description PERICARDIAL FLUID  Final   Special Requests SPEC A  Final   Gram Stain   Final    GRAM POSITIVE COCCI IN BOTH AEROBIC AND ANAEROBIC BOTTLES CRITICAL RESULT CALLED TO, READ BACK BY AND VERIFIED WITH: T ARMSTRONG,RN @0300  05/02/21 [place] Performed at  [place],  [place] [address]    Culture (A)  Final    ENTEROCOCCUS FAECIUM VANCOMYCIN RESISTANT ENTEROCOCCUS ENTEROCOCCUS FAECALIS    Report Status 05/06/2021 FINAL  Final   Organism ID, Bacteria ENTEROCOCCUS FAECIUM  Final   Organism ID, Bacteria ENTEROCOCCUS FAECALIS  Final      Susceptibility   Enterococcus faecalis - MIC*    AMPICILLIN <=2 SENSITIVE Sensitive     VANCOMYCIN 1 SENSITIVE Sensitive     GENTAMICIN SYNERGY SENSITIVE Sensitive     * ENTEROCOCCUS FAECALIS   Enterococcus faecium - MIC*    AMPICILLIN >=32 RESISTANT Resistant     VANCOMYCIN >=32 RESISTANT Resistant     GENTAMICIN SYNERGY SENSITIVE Sensitive     LINEZOLID 2 SENSITIVE Sensitive     * ENTEROCOCCUS FAECIUM  Gram stain     Status: None   Collection Time: 05/01/21 12:42 PM   Specimen: Pericardial  Result Value Ref Range Status   Specimen Description PERICARDIAL FLUID  Final   Special Requests SPEC A  Final   Gram Stain   Final    FEW WBC PRESENT,BOTH PMN AND MONONUCLEAR NO ORGANISMS SEEN Performed at [facility], [place] [address] [place],  [address]    Report Status 05/01/2021 FINAL  Final          Radiology Studies: No results found.      Scheduled Meds:  apixaban  5 mg Oral BID   clopidogrel  75 mg Oral Daily   feeding supplement  237 mL Oral BID BM   fluticasone furoate-vilanterol  1 puff Inhalation Daily   And   umeclidinium bromide  1 puff Inhalation Daily   metoprolol tartrate  37.5 mg Oral BID   multivitamin with minerals  1 tablet Oral Daily   pantoprazole  40 mg Oral BID   sodium chloride flush  5 mL Intracatheter Q8H   vitamin B-12  1,000 mcg Oral Daily   Continuous Infusions:  sodium chloride     DAPTOmycin (CUBICIN)  IV Stopped (05/06/21 2031)   meropenem (MERREM) IV 1 g (05/07/21 0953)     LOS: 24 days        Hosie Poisson, MD [facility]   To contact the attending provider between 7A-7P or the covering  provider during after hours 7P-7A, please log into the web site [email or web address] and access using universal [place] password for that web site. If you do not have the password, please call the hospital operator.  05/07/2021, 2:46 PM

## 2021-05-08 ENCOUNTER — Inpatient Hospital Stay (HOSPITAL_COMMUNITY): Payer: Medicare HMO

## 2021-05-08 DIAGNOSIS — I25118 Atherosclerotic heart disease of native coronary artery with other forms of angina pectoris: Secondary | ICD-10-CM | POA: Diagnosis not present

## 2021-05-08 DIAGNOSIS — I10 Essential (primary) hypertension: Secondary | ICD-10-CM | POA: Diagnosis not present

## 2021-05-08 DIAGNOSIS — E7800 Pure hypercholesterolemia, unspecified: Secondary | ICD-10-CM

## 2021-05-08 DIAGNOSIS — R000 Tachycardia, unspecified: Secondary | ICD-10-CM

## 2021-05-08 DIAGNOSIS — Z86711 Personal history of pulmonary embolism: Secondary | ICD-10-CM | POA: Diagnosis not present

## 2021-05-08 LAB — BASIC METABOLIC PANEL
Anion gap: 10 (ref 5–15)
BUN: 22 mg/dL — ABNORMAL HIGH (ref 6–20)
CO2: 22 mmol/L (ref 22–32)
Calcium: 9.1 mg/dL (ref 8.9–10.3)
Chloride: 102 mmol/L (ref 98–111)
Creatinine, Ser: 1.06 mg/dL (ref 0.61–1.24)
GFR, Estimated: >60 mL/min (ref >60)
Glucose, Bld: 92 mg/dL (ref 70–99)
Potassium: 3.8 mmol/L (ref 3.5–5.1)
Sodium: 134 mmol/L — ABNORMAL LOW (ref 135–145)

## 2021-05-08 MED ORDER — IOHEXOL 300 MG/ML  SOLN
80.0000 mL | Freq: Once | INTRAMUSCULAR | Status: AC | PRN
  Administered 2021-05-08: 80 mL via INTRAVENOUS

## 2021-05-08 NOTE — Progress Notes (Signed)
Cardiology Progress Note  Patient ID: Scott Foley MRN: [id number] DOB: [birth year] Date of Encounter: 05/08/2021  Primary Cardiologist: Sanda Klein, MD  Subjective   Denies any chest pain or SOB.  Tele shows    ROS:  All other ROS reviewed and negative. Pertinent positives noted in the HPI.     Inpatient Medications  Scheduled Meds:  apixaban  5 mg Oral BID   clopidogrel  75 mg Oral Daily   feeding supplement  237 mL Oral BID BM   fluticasone furoate-vilanterol  1 puff Inhalation Daily   And   umeclidinium bromide  1 puff Inhalation Daily   metoprolol tartrate  37.5 mg Oral BID   multivitamin with minerals  1 tablet Oral Daily   pantoprazole  40 mg Oral BID   sodium chloride flush  5 mL Intracatheter Q8H   vitamin B-12  1,000 mcg Oral Daily   Continuous Infusions:  sodium chloride     DAPTOmycin (CUBICIN)  IV 700 mg (05/07/21 2026)   meropenem (MERREM) IV 1 g (05/08/21 0031)   PRN Meds: acetaminophen, albuterol, morphine injection, ondansetron **OR** ondansetron (ZOFRAN) IV, oxyCODONE   Vital Signs   Vitals:   05/07/21 2015 05/07/21 2326 05/08/21 0501 05/08/21 0838  BP: 133/85 127/89 (!) 121/95 (!) 126/92  Pulse: (!) 109 97 (!) 102 (!) 102  Resp: 20 20 19 20   Temp: 97.9 F (36.6 C) 98.3 F (36.8 C) 97.7 F (36.5 C) 97.6 F (36.4 C)  TempSrc: Oral Oral Oral Oral  SpO2: 94% 93% 95% 94%  Weight:   79.2 kg   Height:        Intake/Output Summary (Last 24 hours) at 05/08/2021 0934 Last data filed at 05/08/2021 5284 Gross per 24 hour  Intake 365 ml  Output 1360 ml  Net -995 ml    Last 3 Weights 05/08/2021 05/07/2021 05/06/2021  Weight (lbs) 174 lb 11.2 oz 177 lb 14.6 oz 181 lb 7 oz  Weight (kg) 79.243 kg 80.7 kg 82.3 kg      Telemetry   NSR-  Personally reviewed  Physical Exam   Vitals:   05/07/21 2015 05/07/21 2326 05/08/21 0501 05/08/21 0838  BP: 133/85 127/89 (!) 121/95 (!) 126/92  Pulse: (!) 109 97 (!) 102 (!) 102  Resp: 20 20 19 20    Temp: 97.9 F (36.6 C) 98.3 F (36.8 C) 97.7 F (36.5 C) 97.6 F (36.4 C)  TempSrc: Oral Oral Oral Oral  SpO2: 94% 93% 95% 94%  Weight:   79.2 kg   Height:        Intake/Output Summary (Last 24 hours) at 05/08/2021 0934 Last data filed at 05/08/2021 1324 Gross per 24 hour  Intake 365 ml  Output 1360 ml  Net -995 ml     Last 3 Weights 05/08/2021 05/07/2021 05/06/2021  Weight (lbs) 174 lb 11.2 oz 177 lb 14.6 oz 181 lb 7 oz  Weight (kg) 79.243 kg 80.7 kg 82.3 kg    Body mass index is 29.99 kg/m.   GEN: Well nourished, well developed in no acute distress HEENT: Normal NECK: No JVD; No carotid bruits LYMPHATICS: No lymphadenopathy CARDIAC:RRR, no murmurs, rubs, gallops RESPIRATORY:  Clear to auscultation without rales, wheezing or rhonchi  ABDOMEN: Soft, non-tender, non-distended MUSCULOSKELETAL:  No edema; No deformity  SKIN: Warm and dry NEUROLOGIC:  Alert and oriented x 3 PSYCHIATRIC:  Normal affect   Labs  High Sensitivity Troponin:  No results for input(s): TROPONINIHS in the last 720 hours.  Cardiac EnzymesNo results for input(s): TROPONINI in the last 168 hours. No results for input(s): TROPIPOC in the last 168 hours.  Chemistry Recent Labs  Lab 05/06/21 0200 05/07/21 0357 05/08/21 0211  NA 131* 134* 134*  K 3.8 4.2 3.8  CL 100 101 102  CO2 23 24 22   GLUCOSE 97 90 92  BUN 23* 19 22*  CREATININE 0.89 1.05 1.06  CALCIUM 8.7* 9.3 9.1  GFRNONAA >60 >60 >60  ANIONGAP 8 9 10      Hematology Recent Labs  Lab 05/04/21 0232 05/05/21 0321 05/07/21 0357  WBC 16.3* 17.5* 16.9*  RBC 3.31* 3.21* 3.69*  HGB 9.4* 9.3* 10.3*  HCT 27.9* 27.4* 31.3*  MCV 84.3 85.4 84.8  MCH 28.4 29.0 27.9  MCHC 33.7 33.9 32.9  RDW 16.4* 16.9* 16.8*  PLT 354 334 399    BNPNo results for input(s): BNP, PROBNP in the last 168 hours.   Lab Results  Component Value Date   CKTOTAL <5 (L) 05/07/2021        Component Value Date/Time   CRP 12.7 (H) 05/06/2021 1137   CRP 13.0 (H)  05/04/2021 1241   CRP 17.6 (H) 05/02/2021 1004     Radiology  No results found.  Cardiac Studies   ECHO: LIMITED 05/02/2021  1. Left ventricular ejection fraction, by estimation, is 55 to 60%. The  left ventricle has normal function.   2. The moderate pericardial effusion has been tapped . The remaining  pericardial effusion is now trivial.   3. The mitral valve is grossly normal. Trivial mitral valve  regurgitation.   TTE 04/30/2021  1. Respiratory variation is 34% across the MV. The RA is not well  visualized. There is evidence of early RV diastolic collapse. Similar  findings of tamponade physiology to 04/28/2021 echo. Chart reviewed,  hemodynamics stable since last echo. Appears plan   has been close inpatient monitoring, potential pericardiocentesis if  becomes clinically indicated. . Large pericardial effusion. The  pericardial effusion is circumferential.   2. The inferior vena cava is dilated in size with <50% respiratory  variability, suggesting right atrial pressure of 15 mmHg.   3. Limited echo to evaluate pericardial effusion   Patient Profile  Scott Foley is a 56 y.o. male with CAD, hypertension, PE on Eliquis, stroke, iron deficiency anemia, admitted on 04/13/2021 with severe acute pancreatitis with sepsis.    Cardiology was consulted on 04/28/2021 for large pericardial effusion seen on CT scan.  Pericardiocentesis was delayed due to necrotizing pancreatitis.  Evaluation on 05/01/2021 shows worsening hypotension and tachycardia concerning for pericardial tamponade.  Currently admitted to the hospital with complications of necrotizing pancreatitis.  He developed pancreatitis post ERCP from ascending cholangitis in December 2022.  He has had a rather complicated course with recurrent pancreatitis as well as necrotizing pancreatitis requiring drainage recently.  He has had multiple aspirations of pancreatic fluid collections. CT scan on 04/28/2021 demonstrated large  circumferential pericardial effusion.    Assessment & Plan   #Large pericardial effusion with pericardial tamponade #Hypotension #Cardiogenic shock - s/p pericardial drain placement 02/19>>02/22 w/ resolution of the effusion - exudative effusion by LDL, pericardial fluid grew Gm+ cocci, enterococcus faecium - ABX per IM/ID - pericardial drain removed and VSS - check limited echo today since he is persistently has a low-grade tachycardia just to make sure pericardial fluid has not reaccumulated  #Sinus tachycardia:  - ddx includes dehydration/sepsis/reaccummulation of pericardial fluid - Heart rate is still in the low 100s - Normal  LV function on echo this admission  - increase metoprolol tartrate to 50 mg twice daily if no significant pericardial fluid reaccumulation  #Necrotizing pancreatitis #Sepsis - rx per IM/ID - CRPa nd WBC trending down   #Anemia - transfused on 02/20 - stable last 2 days  #History of PE - restarted Eliquis   #Stroke hx: - Plavix restarted   #HTN -Borderline elevated DBP at 30mmhg -Consider increasing metoprolol tartrate to 50 mg twice daily    For questions or updates, please contact [place] Please consult [email or web address] for contact info under     Fransico Him, MD

## 2021-05-08 NOTE — Progress Notes (Signed)
PROGRESS NOTE    Scott Foley  XTK:[id number] DOB: 14-Mar-1965 DOA: 04/13/2021 PCP: [name], MD   Chief Complaint  Patient presents with   Abdominal Pain    Brief Narrative:    56 year old male with prior history of coronary artery disease, hypertension, history of pulmonary embolism on Eliquis, hyperlipidemia, CVA in 2014, COVID-19 infection, sepsis secondary to bacteremia from Klebsiella pneumonia, recurrent pancreatitis presents with worsening abdominal pain.  Of note patient underwent cholecystectomy on 12/21, ERCP on 12/22.  Post ERCP patient apparently developed pancreatitis.  He was managed conservatively and discharged home.  This admission patient had a CT done which showed pancreatic necrosis and fluid collections.  IR consulted and he underwent aspiration of the fluid collection on 04/18/2021.  Repeat CT showed stable findings.  He was also incidentally found to have significant pericardial effusion concerning for tamponade.  Cardiology was consulted and underwent pericardiocentesis on 05/01/2021.  He underwent 1 unit of PRBC transfusion on 05/02/2021. The pericardial catheter removed on 2/22. Abdominal IR catheter in place with minimal input.  Plan to restart his anticoagulation today and monitor overnight and repeat echo on Monday.  Patient seen today, was febrile overnight  and had persistent tachycardia.   Repeat CT abd and pelvis showed Persistent fluid and gas collection associated with the pancreas. Inflammation surrounding these collections is decreased. Size unchanged 2. One fluid/gas collection adjacent to the head of the pancreas approximates the transverse colon. Concern for communication with the transverse colon which would help explain the gas within the fluid collections ventral to the pancreas. 3. Fluid collections inferior to the pancreas are unchanged. 4. Fluid collection in the RIGHT iliac fossa with drain in place is unchanged 5. Interval marked  reduction in pericardial effusion. 6. Small RIGHT pleural effusion.  Assessment & Plan:   Principal Problem:   Necrotizing pancreatitis Active Problems:   Coronary artery disease with stable angina pectoris (HCC)   Hyperlipemia   Chronic obstructive pulmonary disease (HCC)   Essential hypertension   History of pulmonary embolism   Sepsis ([place])   AKI (acute kidney injury) ([place])   Hypokalemia   Chronic respiratory failure with hypoxia (HCC)   Normocytic anemia   Hypoglycemia   Protein-calorie malnutrition, severe   Pericardial effusion   Pericardial tamponade   Necrotizing pancreatitis present on admission Patient was seen by GI and general surgery and have signed off.  IR consulted and patient underwent percutaneous drainage on 2/6.  Cultures growing klebsiella pneumonia and citrobacter freundii.  Repeat CT on 2/16 shows stable findings. Abd drain in place and not much drainage overnight. Will need a repeat CT abdomen and pelvis prior to the removal of the drain vs outpatient CT. Will defer to IR for the repeat imaging.  Pt reports some soreness in the abd at the site of the catheter.  Waiting for ID for antibiotic duration  Pericardial effusion impending cardiac tamponade CT of the abdomen showed incidental finding of a significant pericardial effusion with concern for tamponade. Patient underwent urgent pericardiocentesis on 2/19 and was found to have a bloody fluid. His anticoagulation has been discontinued. Cardiology recommends holding AC till 2.24. Repeat echocardiogram on 2/20 showed trivial pericardial effusion.  Cardiology plans to repeat echocardiogram on Monday, 05/09/2021. Pericardial drain was removed on 2/22. Pericardial fluid showing enterococcus Faecium, Faecalis and VRE, pt currently on IV meropenam and IV daptomycin. ID on board and following.    Fever: Repeat CT abd and pelvis ordered.  Blood cultures ordered and pending.  Sepsis present on  admission Appears to have resolved. Blood cultures have been negative so far.     Chronic respiratory failure with hypoxia present on admission Patient has been weaned off oxygen at this time currently on room air with oxygen sats between 90 to 93%.   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  Restarted eliquis.     Hypertension Blood pressure parameters are optimal.   Hyperlipidemia Statin on hold.    Coronary artery disease Restart the plavix and statin .  Pt denies any chest pain.    Severe protein calorie malnutrition Encourage oral intake.   Acute kidney injury Presented with a creatinine of 2.4 Patient was aggressively hydrated.  Creatinine back to baseline.   Anemia of chronic disease Hemoglobin around 9.  Continue to monitor.  Hyponatremia:  Sodium has improved to 134.   Persistent tachycardia at rest EKG shows sinus tachycardia. Possibly sinus tachy cardia from fever.  Cardiology increased the metoprolol to 50 mg BID. RPT echocardiogram ordered.    DVT prophylaxis: SCD's Code Status: full code.  Family Communication: family at bedside.  Disposition:   Status is: Inpatient  Remains inpatient appropriate because: IV antibiotics.      Consultants:  Cardiology.   Gastroenterology.  Interventional radiology.  General surgery.  Infectious disease   Procedures:  Drainage of fluid collection in the abdomen by interventional radiology on 2/6   Transthoracic echocardiogram 2/17   Pericardiocentesis 2/19.    Antimicrobials:  Antibiotics Given (last 72 hours)     Date/Time Action Medication Dose Rate   05/05/21 1941 New Bag/Given   DAPTOmycin (CUBICIN) 700 mg in sodium chloride 0.9 % IVPB 700 mg 128 mL/hr   05/06/21 0008 New Bag/Given   meropenem (MERREM) 1 g in sodium chloride 0.9 % 100 mL IVPB 1 g 200 mL/hr   05/06/21 1025  New Bag/Given   meropenem (MERREM) 1 g in sodium chloride 0.9 % 100 mL IVPB 1 g 200 mL/hr   05/06/21 1659 New Bag/Given   meropenem (MERREM) 1 g in sodium chloride 0.9 % 100 mL IVPB 1 g 200 mL/hr   05/06/21 2001 New Bag/Given   DAPTOmycin (CUBICIN) 700 mg in sodium chloride 0.9 % IVPB 700 mg 128 mL/hr   05/06/21 2328 New Bag/Given   meropenem (MERREM) 1 g in sodium chloride 0.9 % 100 mL IVPB 1 g 200 mL/hr   05/07/21 0953 New Bag/Given   meropenem (MERREM) 1 g in sodium chloride 0.9 % 100 mL IVPB 1 g 200 mL/hr   05/07/21 1710 New Bag/Given   meropenem (MERREM) 1 g in sodium chloride 0.9 % 100 mL IVPB 1 g 200 mL/hr   05/07/21 2026 New Bag/Given   DAPTOmycin (CUBICIN) 700 mg in sodium chloride 0.9 % IVPB 700 mg 128 mL/hr   05/08/21 0031 New Bag/Given   meropenem (MERREM) 1 g in sodium chloride 0.9 % 100 mL IVPB 1 g 200 mL/hr   05/08/21 0956 New Bag/Given   meropenem (MERREM) 1 g in sodium chloride 0.9 % 100 mL IVPB 1 g 200 mL/hr        Subjective: Ppt reports not being aware of fever.   Objective: Vitals:   05/07/21 2326 05/08/21 0501 05/08/21 0838 05/08/21 1129  BP: 127/89 (!) 121/95 (!) 126/92 116/88  Pulse: 97 (!) 102 (!) 102 89  Resp: 20 19 20 20   Temp: 98.3 F (36.8 C) 97.7  F (36.5 C) 97.6 F (36.4 C) 97.9 F (36.6 C)  TempSrc: Oral Oral Oral Oral  SpO2: 93% 95% 94% 93%  Weight:  79.2 kg    Height:        Intake/Output Summary (Last 24 hours) at 05/08/2021 1606 Last data filed at 05/08/2021 7939 Gross per 24 hour  Intake 245 ml  Output 1360 ml  Net -1115 ml    Filed Weights   05/06/21 0443 05/07/21 0400 05/08/21 0501  Weight: 82.3 kg 80.7 kg 79.2 kg    Examination:  General exam: Appears calm and comfortable  Respiratory system: Clear to auscultation. Respiratory effort normal. Cardiovascular system: S1 & S2 heard, tachycardia.  No pedal edema. Gastrointestinal system: Abdomen is nondistended, soft and nontender.IR drain in place.  Normal bowel sounds  heard. Central nervous system: Alert and oriented. No focal neurological deficits. Extremities: Symmetric 5 x 5 power. Skin: No rashes, lesions or ulcers Psychiatry: Mood & affect appropriate.         Data Reviewed: I have personally reviewed following labs and imaging studies  CBC: Recent Labs  Lab 05/02/21 0120 05/02/21 1610 05/03/21 0007 05/04/21 0232 05/05/21 0321 05/07/21 0357  WBC 13.0*  --  15.7* 16.3* 17.5* 16.9*  NEUTROABS  --   --   --   --   --  11.4*  HGB 7.1* 10.0* 9.9* 9.4* 9.3* 10.3*  HCT 22.3* 28.7* 28.8* 27.9* 27.4* 31.3*  MCV 86.1  --  84.2 84.3 85.4 84.8  PLT 383  --  376 354 334 399     Basic Metabolic Panel: Recent Labs  Lab 05/04/21 0232 05/05/21 0321 05/06/21 0200 05/07/21 0357 05/08/21 0211  NA 131* 129* 131* 134* 134*  K 3.6 4.0 3.8 4.2 3.8  CL 98 97* 100 101 102  CO2 24 22 23 24 22   GLUCOSE 96 97 97 90 92  BUN 26* 23* 23* 19 22*  CREATININE 0.94 1.04 0.89 1.05 1.06  CALCIUM 8.6* 8.6* 8.7* 9.3 9.1  MG 1.7  --   --   --   --      GFR: Estimated Creatinine Clearance: 73.1 mL/min (by C-G formula based on SCr of 1.06 mg/dL).  Liver Function Tests: No results for input(s): AST, ALT, ALKPHOS, BILITOT, PROT, ALBUMIN in the last 168 hours.  CBG: Recent Labs  Lab 05/05/21 0620 05/05/21 1154 05/05/21 1720 05/05/21 2033 05/06/21 0622  GLUCAP 105* 111* 105* 116* 93      Recent Results (from the past 240 hour(s))  Surgical PCR screen     Status: None   Collection Time: 05/01/21 11:34 AM   Specimen: Nasal Mucosa; Nasal Swab  Result Value Ref Range Status   MRSA, PCR NEGATIVE NEGATIVE Final   Staphylococcus aureus NEGATIVE NEGATIVE Final    Comment: (NOTE) The Xpert SA Assay (FDA approved for NASAL specimens in patients 88 years of age and older), is one component of a comprehensive surveillance program. It is not intended to diagnose infection nor to guide or monitor treatment. Performed at [facility], [place]  [address] [place], [place] [address]   Culture, body fluid w Gram Stain-bottle     Status: Abnormal   Collection Time: 05/01/21 12:42 PM   Specimen: Pericardial  Result Value Ref Range Status   Specimen Description PERICARDIAL FLUID  Final   Special Requests SPEC A  Final   Gram Stain   Final    GRAM POSITIVE COCCI IN BOTH AEROBIC AND ANAEROBIC BOTTLES CRITICAL RESULT  CALLED TO, READ BACK BY AND VERIFIED WITH: T ARMSTRONG,RN @0300  05/02/21 [place] Performed at [facility], [place] [address], [place], [place] [address]    Culture (A)  Final    ENTEROCOCCUS FAECIUM VANCOMYCIN RESISTANT ENTEROCOCCUS ENTEROCOCCUS FAECALIS    Report Status 05/06/2021 FINAL  Final   Organism ID, Bacteria ENTEROCOCCUS FAECIUM  Final   Organism ID, Bacteria ENTEROCOCCUS FAECALIS  Final      Susceptibility   Enterococcus faecalis - MIC*    AMPICILLIN <=2 SENSITIVE Sensitive     VANCOMYCIN 1 SENSITIVE Sensitive     GENTAMICIN SYNERGY SENSITIVE Sensitive     * ENTEROCOCCUS FAECALIS   Enterococcus faecium - MIC*    AMPICILLIN >=32 RESISTANT Resistant     VANCOMYCIN >=32 RESISTANT Resistant     GENTAMICIN SYNERGY SENSITIVE Sensitive     LINEZOLID 2 SENSITIVE Sensitive     * ENTEROCOCCUS FAECIUM  Gram stain     Status: None   Collection Time: 05/01/21 12:42 PM   Specimen: Pericardial  Result Value Ref Range Status   Specimen Description PERICARDIAL FLUID  Final   Special Requests SPEC A  Final   Gram Stain   Final    FEW WBC PRESENT,BOTH PMN AND MONONUCLEAR NO ORGANISMS SEEN Performed at [facility], [place] [address] [place],  [address]    Report Status 05/01/2021 FINAL  Final          Radiology Studies: CT ABDOMEN PELVIS W CONTRAST  Result Date: 05/08/2021 CLINICAL DATA:  Follow-up necrotizing pancreatitis. RIGHT lower quadrant abdominal pain. Evaluate percutaneous drainage catheter. EXAM: CT ABDOMEN AND PELVIS WITH CONTRAST TECHNIQUE: Multidetector CT imaging of the abdomen and  pelvis was performed using the standard protocol following bolus administration of intravenous contrast. RADIATION DOSE REDUCTION: This exam was performed according to the departmental dose-optimization program which includes automated exposure control, adjustment of the mA and/or kV according to patient size and/or use of iterative reconstruction technique. CONTRAST:  51mL OMNIPAQUE IOHEXOL 300 MG/ML  SOLN COMPARISON:  CT 04/28/2021 FINDINGS: Lower chest: Interval reduction in pericardial effusion. Mild effusion remains along the RIGHT heart border measuring only 8 mm in depth. Hepatobiliary: Postcholecystectomy. Pneumobilia within the LEFT hepatic lobe common bile duct similar prior. Pancreas: Again demonstrated walled-off fluid collections adjacent to the pancreas with interspersed gas. The degree of inflammation in the retroperitoneal fat adjacent to collections is decreased. Collection mid body the pancreas measures 4.3 x 3.0 cm compared with 4.8 by 3.2 cm remeasured. This collection now has gas where previously only fluid. A second gas and fluid collection adjacent the head of the pancreas measures 3.8 x 2.6 cm (image 33/series 3) compared to 4.3 x 3.0 cm for no interval change. This collection approaches the transverse colon (image 32/3). Cannot completely exclude communication with this gas and fluid collection with the transverse colon. Approximation between the collection in transverse colon is also seen on sagittal image 56/8. Several walled-off fluid collection inferior to the pancreas in the RIGHT upper quadrant are unchanged (image 43/series 3). In the RIGHT lower quadrant fluid collection in the RIGHT iliac fossa measures 3.2 cm (image 57/3) slightly decreased from 3.6 cm. Pigtail catheter remains within this collection Spleen: Normal spleen Adrenals/urinary tract: Adrenal glands and kidneys are normal. The ureters and bladder normal. Stomach/Bowel: Stomach, duodenum and small bowel normal. Appendix  not identified. Ascending colon transverse colon normal. Transverse colon approximates the peripancreatic fluid collection as described above. LEFT colon rectum normal. Vascular/Lymphatic: Abdominal aorta is normal  caliber with atherosclerotic calcification. There is no retroperitoneal or periportal lymphadenopathy. No pelvic lymphadenopathy. Reproductive: Unremarkable Other: Nodularity along the retroperitoneum of the LEFT along the perinephric space (is 41/3 is unchanged. Favor inflammatory Musculoskeletal: No aggressive osseous lesion. IMPRESSION: 1. Persistent fluid and gas collection associated with the pancreas. Inflammation surrounding these collections is decreased. Size unchanged 2. One fluid/gas collection adjacent to the head of the pancreas approximates the transverse colon. Concern for communication with the transverse colon which would help explain the gas within the fluid collections ventral to the pancreas. 3. Fluid collections inferior to the pancreas are unchanged. 4. Fluid collection in the RIGHT iliac fossa with drain in place is unchanged 5. Interval marked reduction in pericardial effusion. 6. Small RIGHT pleural effusion. Electronically Signed   By: Suzy Bouchard M.D.   On: 05/08/2021 14:31        Scheduled Meds:  apixaban  5 mg Oral BID   clopidogrel  75 mg Oral Daily   feeding supplement  237 mL Oral BID BM   fluticasone furoate-vilanterol  1 puff Inhalation Daily   And   umeclidinium bromide  1 puff Inhalation Daily   metoprolol tartrate  37.5 mg Oral BID   multivitamin with minerals  1 tablet Oral Daily   pantoprazole  40 mg Oral BID   sodium chloride flush  5 mL Intracatheter Q8H   vitamin B-12  1,000 mcg Oral Daily   Continuous Infusions:  sodium chloride     DAPTOmycin (CUBICIN)  IV 700 mg (05/07/21 2026)   meropenem (MERREM) IV 1 g (05/08/21 0956)     LOS: 25 days        Hosie Poisson, MD [facility]   To contact the attending provider  between 7A-7P or the covering provider during after hours 7P-7A, please log into the web site [email or web address] and access using universal [place] password for that web site. If you do not have the password, please call the hospital operator.  05/08/2021, 4:06 PM

## 2021-05-09 ENCOUNTER — Inpatient Hospital Stay (HOSPITAL_COMMUNITY): Payer: Medicare HMO

## 2021-05-09 DIAGNOSIS — I25118 Atherosclerotic heart disease of native coronary artery with other forms of angina pectoris: Secondary | ICD-10-CM | POA: Diagnosis not present

## 2021-05-09 DIAGNOSIS — K8591 Acute pancreatitis with uninfected necrosis, unspecified: Secondary | ICD-10-CM | POA: Diagnosis not present

## 2021-05-09 DIAGNOSIS — E7800 Pure hypercholesterolemia, unspecified: Secondary | ICD-10-CM | POA: Diagnosis not present

## 2021-05-09 DIAGNOSIS — Z86711 Personal history of pulmonary embolism: Secondary | ICD-10-CM | POA: Diagnosis not present

## 2021-05-09 DIAGNOSIS — I3139 Other pericardial effusion (noninflammatory): Secondary | ICD-10-CM | POA: Diagnosis not present

## 2021-05-09 DIAGNOSIS — I10 Essential (primary) hypertension: Secondary | ICD-10-CM | POA: Diagnosis not present

## 2021-05-09 LAB — CBC WITH DIFFERENTIAL/PLATELET
Abs Immature Granulocytes: 0.09 10*3/uL — ABNORMAL HIGH (ref 0.00–0.07)
Basophils Absolute: 0.1 10*3/uL (ref 0.0–0.1)
Basophils Relative: 1 %
Eosinophils Absolute: 0.5 10*3/uL (ref 0.0–0.5)
Eosinophils Relative: 4 %
HCT: 29.7 % — ABNORMAL LOW (ref 39.0–52.0)
Hemoglobin: 10 g/dL — ABNORMAL LOW (ref 13.0–17.0)
Immature Granulocytes: 1 %
Lymphocytes Relative: 21 %
Lymphs Abs: 2.6 10*3/uL (ref 0.7–4.0)
MCH: 28.2 pg (ref 26.0–34.0)
MCHC: 33.7 g/dL (ref 30.0–36.0)
MCV: 83.9 fL (ref 80.0–100.0)
Monocytes Absolute: 1.1 10*3/uL — ABNORMAL HIGH (ref 0.1–1.0)
Monocytes Relative: 9 %
Neutro Abs: 8 10*3/uL — ABNORMAL HIGH (ref 1.7–7.7)
Neutrophils Relative %: 64 %
Platelets: 431 10*3/uL — ABNORMAL HIGH (ref 150–400)
RBC: 3.54 MIL/uL — ABNORMAL LOW (ref 4.22–5.81)
RDW: 16.5 % — ABNORMAL HIGH (ref 11.5–15.5)
WBC: 12.3 10*3/uL — ABNORMAL HIGH (ref 4.0–10.5)
nRBC: 0 % (ref 0.0–0.2)

## 2021-05-09 LAB — BASIC METABOLIC PANEL WITH GFR
Anion gap: 9 (ref 5–15)
BUN: 19 mg/dL (ref 6–20)
CO2: 22 mmol/L (ref 22–32)
Calcium: 9.3 mg/dL (ref 8.9–10.3)
Chloride: 103 mmol/L (ref 98–111)
Creatinine, Ser: 0.89 mg/dL (ref 0.61–1.24)
GFR, Estimated: >60 mL/min
Glucose, Bld: 90 mg/dL (ref 70–99)
Potassium: 3.9 mmol/L (ref 3.5–5.1)
Sodium: 134 mmol/L — ABNORMAL LOW (ref 135–145)

## 2021-05-09 LAB — ECHOCARDIOGRAM LIMITED
Height: 64 in
Weight: 2793.67 oz

## 2021-05-09 LAB — C-REACTIVE PROTEIN: CRP: 7.9 mg/dL — ABNORMAL HIGH (ref <1.0)

## 2021-05-09 NOTE — Progress Notes (Signed)
PROGRESS NOTE    Scott Foley  KDX:[id number] DOB: December 12, 1965 DOA: 04/13/2021 PCP: [name], MD   Chief Complaint  Patient presents with   Abdominal Pain    Brief Narrative:    56 year old male with prior history of coronary artery disease, hypertension, history of pulmonary embolism on Eliquis, hyperlipidemia, CVA in 2014, COVID-19 infection, sepsis secondary to bacteremia from Klebsiella pneumonia, recurrent pancreatitis presents with worsening abdominal pain.  Of note patient underwent cholecystectomy on 12/21, ERCP on 12/22.  Post ERCP patient apparently developed pancreatitis.  He was managed conservatively and discharged home.  This admission patient had a CT done which showed pancreatic necrosis and fluid collections.  IR consulted and he underwent aspiration of the fluid collection on 04/18/2021.  Repeat CT showed stable findings.  He was also incidentally found to have significant pericardial effusion concerning for tamponade.  Cardiology was consulted and underwent pericardiocentesis on 05/01/2021.  He underwent 1 unit of PRBC transfusion on 05/02/2021. The pericardial catheter removed on 2/22. Abdominal IR catheter in place with minimal input.  Plan to restart his anticoagulation today and monitor overnight and repeat echo on Monday.  Patient seen today, was febrile overnight  and had persistent tachycardia.   Repeat CT abd and pelvis showed Persistent fluid and gas collection associated with the pancreas. Inflammation surrounding these collections is decreased. One fluid/gas collection adjacent to the head of the pancreas approximates the transverse colon. Concern for communication with the transverse colon which would help explain the gas within the fluid collections ventral to the pancreas.  Pt denies any nausea, vomiting or abdominal pain.   Assessment & Plan:   Principal Problem:   Necrotizing pancreatitis Active Problems:   Coronary artery disease with stable  angina pectoris (HCC)   Hyperlipemia   Chronic obstructive pulmonary disease (HCC)   Essential hypertension   History of pulmonary embolism   Sepsis ([place])   AKI (acute kidney injury) ([place])   Hypokalemia   Chronic respiratory failure with hypoxia (HCC)   Normocytic anemia   Hypoglycemia   Protein-calorie malnutrition, severe   Pericardial effusion   Pericardial tamponade   Necrotizing pancreatitis present on admission Patient was seen by GI and general surgery and have signed off.  IR consulted and patient underwent percutaneous drainage on 2/6.  Cultures growing klebsiella pneumonia and citrobacter freundii.  Repeat CT on 2/16 shows stable findings. Abd drain in place and not much drainage overnight. Will need a repeat CT abdomen and pelvis prior to the removal of the drain vs outpatient CT. Will defer to IR for the repeat imaging.  Pt reports some soreness in the abd at the site of the catheter.  Plan for 14 day course of antibiotics.   Pericardial effusion impending cardiac tamponade CT of the abdomen showed incidental finding of a significant pericardial effusion with concern for tamponade. Patient underwent urgent pericardiocentesis on 2/19 and was found to have a bloody fluid. His anticoagulation has been discontinued. Cardiology recommends holding AC till 2.24. Repeat echocardiogram on 2/20 showed trivial pericardial effusion.  Cardiology plans to repeat echocardiogram on Monday, 05/09/2021. Pericardial drain was removed on 2/22. Pericardial fluid showing enterococcus Faecium, Faecalis and VRE, pt currently on IV meropenam and IV daptomycin. ID on board and following.  Plan for repeat echocardiogram today.   Fever: Repeat CT abd and pelvis ordered and reviewed with the patient.  Blood cultures ordered and pending. Negative so far.    Sepsis present on admission Appears to have resolved. Blood cultures have  been negative so far.     Chronic respiratory failure with  hypoxia present on admission Patient has been weaned off oxygen at this time currently on room air with oxygen sats between 90 to 93%.   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  Restarted eliquis.     Hypertension Blood pressure parameters  are well controlled.    Hyperlipidemia Statin on hold.    Coronary artery disease Restart the plavix and statin .  Pt denies any chest pain.    Severe protein calorie malnutrition Encourage oral intake.   Acute kidney injury Presented with a creatinine of 2.4 Patient was aggressively hydrated.  Creatinine back to baseline.   Anemia of chronic disease Hemoglobin around 9.  Continue to monitor.  Hyponatremia:  Sodium has improved to 134.   Persistent tachycardia at rest EKG shows sinus tachycardia. Possibly sinus tachy cardia from fever.  Cardiology increased the metoprolol to 50 mg BID. Repeat echocardiogram plan for today.    DVT prophylaxis: SCD's Code Status: full code.  Family Communication: family at bedside.  Disposition:   Status is: Inpatient  Remains inpatient appropriate because: IV antibiotics.      Consultants:  Cardiology.   Gastroenterology.   Interventional radiology.   General surgery.   Infectious disease   Procedures:  Drainage of fluid collection in the abdomen by interventional radiology on 2/6   Transthoracic echocardiogram 2/17   Pericardiocentesis 2/19.    Antimicrobials:  Antibiotics Given (last 72 hours)     Date/Time Action Medication Dose Rate   05/06/21 1659 New Bag/Given   meropenem (MERREM) 1 g in sodium chloride 0.9 % 100 mL IVPB 1 g 200 mL/hr   05/06/21 2001 New Bag/Given   DAPTOmycin (CUBICIN) 700 mg in sodium chloride 0.9 % IVPB 700 mg 128 mL/hr   05/06/21 2328 New Bag/Given   meropenem (MERREM) 1 g in sodium chloride 0.9 % 100 mL IVPB 1 g 200 mL/hr    05/07/21 0953 New Bag/Given   meropenem (MERREM) 1 g in sodium chloride 0.9 % 100 mL IVPB 1 g 200 mL/hr   05/07/21 1710 New Bag/Given   meropenem (MERREM) 1 g in sodium chloride 0.9 % 100 mL IVPB 1 g 200 mL/hr   05/07/21 2026 New Bag/Given   DAPTOmycin (CUBICIN) 700 mg in sodium chloride 0.9 % IVPB 700 mg 128 mL/hr   05/08/21 0031 New Bag/Given   meropenem (MERREM) 1 g in sodium chloride 0.9 % 100 mL IVPB 1 g 200 mL/hr   05/08/21 0956 New Bag/Given   meropenem (MERREM) 1 g in sodium chloride 0.9 % 100 mL IVPB 1 g 200 mL/hr   05/08/21 1628 New Bag/Given   meropenem (MERREM) 1 g in sodium chloride 0.9 % 100 mL IVPB 1 g 200 mL/hr   05/08/21 2216 New Bag/Given   DAPTOmycin (CUBICIN) 700 mg in sodium chloride 0.9 % IVPB 700 mg 128 mL/hr   05/09/21 0018 New Bag/Given   meropenem (MERREM) 1 g in sodium chloride 0.9 % 100 mL IVPB 1 g 200 mL/hr   05/09/21 0918 New Bag/Given   meropenem (MERREM) 1 g in sodium chloride 0.9 % 100 mL IVPB 1 g 200 mL/hr        Subjective: No new complaints today.   Objective: Vitals:   05/09/21 0641 05/09/21 0745 05/09/21 0838 05/09/21 1137  BP: 128/90 (!) 135/93  121/89  Pulse: 97 96  88  Resp: 20 20  20   Temp: 98 F (36.7 C) 97.7 F (36.5 C)  97.9 F (36.6 C)  TempSrc: Oral Oral  Oral  SpO2: 98% 99% 98% 96%  Weight: 79.2 kg     Height:        Intake/Output Summary (Last 24 hours) at 05/09/2021 1156 Last data filed at 05/09/2021 8016 Gross per 24 hour  Intake 370 ml  Output 910 ml  Net -540 ml    Filed Weights   05/07/21 0400 05/08/21 0501 05/09/21 0641  Weight: 80.7 kg 79.2 kg 79.2 kg    Examination:  General exam: Appears calm and comfortable  Respiratory system: Clear to auscultation. Respiratory effort normal. Cardiovascular system: S1 & S2 heard, RRR. No JVD, . No pedal edema. Gastrointestinal system: Abdomen is nondistended, soft and nontender. IR drain insertion site unremarkable. Mild soreness at the site of insertion.  . Normal  bowel sounds heard. Central nervous system: Alert and oriented. No focal neurological deficits. Extremities: Symmetric 5 x 5 power. Skin: No rashes, lesions or ulcers Psychiatry: Mood & affect appropriate.          Data Reviewed: I have personally reviewed following labs and imaging studies  CBC: Recent Labs  Lab 05/03/21 0007 05/04/21 0232 05/05/21 0321 05/07/21 0357 05/09/21 0159  WBC 15.7* 16.3* 17.5* 16.9* 12.3*  NEUTROABS  --   --   --  11.4* 8.0*  HGB 9.9* 9.4* 9.3* 10.3* 10.0*  HCT 28.8* 27.9* 27.4* 31.3* 29.7*  MCV 84.2 84.3 85.4 84.8 83.9  PLT 376 354 334 399 431*     Basic Metabolic Panel: Recent Labs  Lab 05/04/21 0232 05/05/21 0321 05/06/21 0200 05/07/21 0357 05/08/21 0211 05/09/21 0159  NA 131* 129* 131* 134* 134* 134*  K 3.6 4.0 3.8 4.2 3.8 3.9  CL 98 97* 100 101 102 103  CO2 24 22 23 24 22 22   GLUCOSE 96 97 97 90 92 90  BUN 26* 23* 23* 19 22* 19  CREATININE 0.94 1.04 0.89 1.05 1.06 0.89  CALCIUM 8.6* 8.6* 8.7* 9.3 9.1 9.3  MG 1.7  --   --   --   --   --      GFR: Estimated Creatinine Clearance: 87 mL/min (by C-G formula based on SCr of 0.89 mg/dL).  Liver Function Tests: No results for input(s): AST, ALT, ALKPHOS, BILITOT, PROT, ALBUMIN in the last 168 hours.  CBG: Recent Labs  Lab 05/05/21 0620 05/05/21 1154 05/05/21 1720 05/05/21 2033 05/06/21 0622  GLUCAP 105* 111* 105* 116* 93      Recent Results (from the past 240 hour(s))  Surgical PCR screen     Status: None   Collection Time: 05/01/21 11:34 AM   Specimen: Nasal Mucosa; Nasal Swab  Result Value Ref Range Status   MRSA, PCR NEGATIVE NEGATIVE Final   Staphylococcus aureus NEGATIVE NEGATIVE Final    Comment: (NOTE) The Xpert SA Assay (FDA approved for NASAL specimens in patients 56 years of age and older), is one component of a comprehensive surveillance program. It is not intended to diagnose infection nor to guide or monitor treatment. Performed at [facility], [place] [address] [place], [place] [address]   Culture, body fluid w Gram Stain-bottle     Status: Abnormal   Collection Time: 05/01/21 12:42 PM   Specimen: Pericardial  Result Value Ref Range Status   Specimen Description PERICARDIAL FLUID  Final   Special Requests [place] A  Final  Gram Stain   Final    GRAM POSITIVE COCCI IN BOTH AEROBIC AND ANAEROBIC BOTTLES CRITICAL RESULT CALLED TO, READ BACK BY AND VERIFIED WITH: T ARMSTRONG,RN @0300  05/02/21 [place] Performed at [facility], [address] [place], [place] [address]    Culture (A)  Final    ENTEROCOCCUS FAECIUM VANCOMYCIN RESISTANT ENTEROCOCCUS ENTEROCOCCUS FAECALIS    Report Status 05/06/2021 FINAL  Final   Organism ID, Bacteria ENTEROCOCCUS FAECIUM  Final   Organism ID, Bacteria ENTEROCOCCUS FAECALIS  Final      Susceptibility   Enterococcus faecalis - MIC*    AMPICILLIN <=2 SENSITIVE Sensitive     VANCOMYCIN 1 SENSITIVE Sensitive     GENTAMICIN SYNERGY SENSITIVE Sensitive     * ENTEROCOCCUS FAECALIS   Enterococcus faecium - MIC*    AMPICILLIN >=32 RESISTANT Resistant     VANCOMYCIN >=32 RESISTANT Resistant     GENTAMICIN SYNERGY SENSITIVE Sensitive     LINEZOLID 2 SENSITIVE Sensitive     * ENTEROCOCCUS FAECIUM  Gram stain     Status: None   Collection Time: 05/01/21 12:42 PM   Specimen: Pericardial  Result Value Ref Range Status   Specimen Description PERICARDIAL FLUID  Final   Special Requests SPEC A  Final   Gram Stain   Final    FEW WBC PRESENT,BOTH PMN AND MONONUCLEAR NO ORGANISMS SEEN Performed at [facility], [place] [address], [place], [place] [address]    Report Status 05/01/2021 FINAL  Final  Culture, blood (Routine X 2) w Reflex to ID Panel     Status: None (Preliminary result)   Collection Time: 05/08/21  9:55 AM   Specimen: BLOOD LEFT HAND  Result Value Ref Range Status   Specimen Description BLOOD LEFT HAND  Final   Special Requests   Final    BOTTLES DRAWN AEROBIC ONLY Blood  Culture results may not be optimal due to an inadequate volume of blood received in culture bottles   Culture   Final    NO GROWTH < 24 HOURS Performed at [facility], [place] [address], [place], [place] [address]    Report Status PENDING  Incomplete  Culture, blood (Routine X 2) w Reflex to ID Panel     Status: None (Preliminary result)   Collection Time: 05/08/21 10:08 AM   Specimen: BLOOD RIGHT HAND  Result Value Ref Range Status   Specimen Description BLOOD RIGHT HAND  Final   Special Requests   Final    BOTTLES DRAWN AEROBIC AND ANAEROBIC Blood Culture results may not be optimal due to an inadequate volume of blood received in culture bottles   Culture   Final    NO GROWTH < 24 HOURS Performed at [facility], [place] [address] [place], [place] [address]    Report Status PENDING  Incomplete          Radiology Studies: CT ABDOMEN PELVIS W CONTRAST  Result Date: 05/08/2021 CLINICAL DATA:  Follow-up necrotizing pancreatitis. RIGHT lower quadrant abdominal pain. Evaluate percutaneous drainage catheter. EXAM: CT ABDOMEN AND PELVIS WITH CONTRAST TECHNIQUE: Multidetector CT imaging of the abdomen and pelvis was performed using the standard protocol following bolus administration of intravenous contrast. RADIATION DOSE REDUCTION: This exam was performed according to the departmental dose-optimization program which includes automated exposure control, adjustment of the mA and/or kV according to patient size and/or use of iterative reconstruction technique. CONTRAST:  17mL OMNIPAQUE IOHEXOL 300 MG/ML  SOLN COMPARISON:  CT 04/28/2021 FINDINGS:  Lower chest: Interval reduction in pericardial effusion. Mild effusion remains along the RIGHT heart border measuring only 8 mm in depth. Hepatobiliary: Postcholecystectomy. Pneumobilia within the LEFT hepatic lobe common bile duct similar prior. Pancreas: Again demonstrated walled-off fluid collections adjacent to the pancreas with interspersed  gas. The degree of inflammation in the retroperitoneal fat adjacent to collections is decreased. Collection mid body the pancreas measures 4.3 x 3.0 cm compared with 4.8 by 3.2 cm remeasured. This collection now has gas where previously only fluid. A second gas and fluid collection adjacent the head of the pancreas measures 3.8 x 2.6 cm (image 33/series 3) compared to 4.3 x 3.0 cm for no interval change. This collection approaches the transverse colon (image 32/3). Cannot completely exclude communication with this gas and fluid collection with the transverse colon. Approximation between the collection in transverse colon is also seen on sagittal image 56/8. Several walled-off fluid collection inferior to the pancreas in the RIGHT upper quadrant are unchanged (image 43/series 3). In the RIGHT lower quadrant fluid collection in the RIGHT iliac fossa measures 3.2 cm (image 57/3) slightly decreased from 3.6 cm. Pigtail catheter remains within this collection Spleen: Normal spleen Adrenals/urinary tract: Adrenal glands and kidneys are normal. The ureters and bladder normal. Stomach/Bowel: Stomach, duodenum and small bowel normal. Appendix not identified. Ascending colon transverse colon normal. Transverse colon approximates the peripancreatic fluid collection as described above. LEFT colon rectum normal. Vascular/Lymphatic: Abdominal aorta is normal caliber with atherosclerotic calcification. There is no retroperitoneal or periportal lymphadenopathy. No pelvic lymphadenopathy. Reproductive: Unremarkable Other: Nodularity along the retroperitoneum of the LEFT along the perinephric space (is 41/3 is unchanged. Favor inflammatory Musculoskeletal: No aggressive osseous lesion. IMPRESSION: 1. Persistent fluid and gas collection associated with the pancreas. Inflammation surrounding these collections is decreased. Size unchanged 2. One fluid/gas collection adjacent to the head of the pancreas approximates the transverse colon.  Concern for communication with the transverse colon which would help explain the gas within the fluid collections ventral to the pancreas. 3. Fluid collections inferior to the pancreas are unchanged. 4. Fluid collection in the RIGHT iliac fossa with drain in place is unchanged 5. Interval marked reduction in pericardial effusion. 6. Small RIGHT pleural effusion. Electronically Signed   By: Suzy Bouchard M.D.   On: 05/08/2021 14:31        Scheduled Meds:  apixaban  5 mg Oral BID   clopidogrel  75 mg Oral Daily   feeding supplement  237 mL Oral BID BM   fluticasone furoate-vilanterol  1 puff Inhalation Daily   And   umeclidinium bromide  1 puff Inhalation Daily   metoprolol tartrate  37.5 mg Oral BID   multivitamin with minerals  1 tablet Oral Daily   pantoprazole  40 mg Oral BID   sodium chloride flush  5 mL Intracatheter Q8H   vitamin B-12  1,000 mcg Oral Daily   Continuous Infusions:  sodium chloride     DAPTOmycin (CUBICIN)  IV 700 mg (05/08/21 2216)   meropenem (MERREM) IV 1 g (05/09/21 0918)     LOS: 26 days        Hosie Poisson, MD [facility]   To contact the attending provider between 7A-7P or the covering provider during after hours 7P-7A, please log into the web site [email or web address] and access using universal [place] password for that web site. If you do not have the password, please call the hospital operator.  05/09/2021, 11:56 AM

## 2021-05-09 NOTE — Progress Notes (Signed)
[facility] for Infectious [facility]   Reason for visit: Follow up on necrotizing pancreatitis  Interval History: no new concerns; > 48 hours afebrile; WBC 12.3.   No significant abdominal pain  Physical Exam: Constitutional:  Vitals:   05/09/21 0745 05/09/21 0838  BP: (!) 135/93   Pulse: 96   Resp: 20   Temp: 97.7 F (36.5 C)   SpO2: 99% 98%   patient appears in NAD Respiratory: Normal respiratory effort GI: soft, nt, nd  Review of Systems: Constitutional: negative for fevers and chills Gastrointestinal: negative for diarrhea  Lab Results  Component Value Date   WBC 12.3 (H) 05/09/2021   HGB 10.0 (L) 05/09/2021   HCT 29.7 (L) 05/09/2021   MCV 83.9 05/09/2021   PLT 431 (H) 05/09/2021    Lab Results  Component Value Date   CREATININE 0.89 05/09/2021   BUN 19 05/09/2021   NA 134 (L) 05/09/2021   K 3.9 05/09/2021   CL 103 05/09/2021   CO2 22 05/09/2021    Lab Results  Component Value Date   ALT 17 04/18/2021   AST 24 04/18/2021   ALKPHOS 77 04/18/2021     Microbiology: Recent Results (from the past 240 hour(s))  Surgical PCR screen     Status: None   Collection Time: 05/01/21 11:34 AM   Specimen: Nasal Mucosa; Nasal Swab  Result Value Ref Range Status   MRSA, PCR NEGATIVE NEGATIVE Final   Staphylococcus aureus NEGATIVE NEGATIVE Final    Comment: (NOTE) The Xpert SA Assay (FDA approved for NASAL specimens in patients 13 years of age and older), is one component of a comprehensive surveillance program. It is not intended to diagnose infection nor to guide or monitor treatment. Performed at [facility], [place] [address] [place], [place] [address]   Culture, body fluid w Gram Stain-bottle     Status: Abnormal   Collection Time: 05/01/21 12:42 PM   Specimen: Pericardial  Result Value Ref Range Status   Specimen Description PERICARDIAL FLUID  Final   Special Requests SPEC A  Final   Gram Stain   Final    GRAM POSITIVE COCCI IN BOTH AEROBIC  AND ANAEROBIC BOTTLES CRITICAL RESULT CALLED TO, READ BACK BY AND VERIFIED WITH: T ARMSTRONG,RN @0300  05/02/21 [place] Performed at [facility] [address] [place], [place] [address]    Culture (A)  Final    ENTEROCOCCUS FAECIUM VANCOMYCIN RESISTANT ENTEROCOCCUS ENTEROCOCCUS FAECALIS    Report Status 05/06/2021 FINAL  Final   Organism ID, Bacteria ENTEROCOCCUS FAECIUM  Final   Organism ID, Bacteria ENTEROCOCCUS FAECALIS  Final      Susceptibility   Enterococcus faecalis - MIC*    AMPICILLIN <=2 SENSITIVE Sensitive     VANCOMYCIN 1 SENSITIVE Sensitive     GENTAMICIN SYNERGY SENSITIVE Sensitive     * ENTEROCOCCUS FAECALIS   Enterococcus faecium - MIC*    AMPICILLIN >=32 RESISTANT Resistant     VANCOMYCIN >=32 RESISTANT Resistant     GENTAMICIN SYNERGY SENSITIVE Sensitive     LINEZOLID 2 SENSITIVE Sensitive     * ENTEROCOCCUS FAECIUM  Gram stain     Status: None   Collection Time: 05/01/21 12:42 PM   Specimen: Pericardial  Result Value Ref Range Status   Specimen Description PERICARDIAL FLUID  Final   Special Requests SPEC A  Final   Gram Stain   Final    FEW WBC PRESENT,BOTH PMN AND MONONUCLEAR NO ORGANISMS SEEN Performed at [facility], [place] [address] [place], [place] [address]    Report Status 05/01/2021 FINAL  Final  Culture, blood (Routine X 2) w Reflex to ID Panel     Status: None (Preliminary result)   Collection Time: 05/08/21  9:55 AM   Specimen: BLOOD LEFT HAND  Result Value Ref Range Status   Specimen Description BLOOD LEFT HAND  Final   Special Requests   Final    BOTTLES DRAWN AEROBIC ONLY Blood Culture results may not be optimal due to an inadequate volume of blood received in culture bottles   Culture   Final    NO GROWTH < 24 HOURS Performed at [facility], [place] [address], [place], [place] [address]    Report Status PENDING  Incomplete  Culture, blood (Routine X 2) w Reflex to ID Panel     Status: None (Preliminary result)    Collection Time: 05/08/21 10:08 AM   Specimen: BLOOD RIGHT HAND  Result Value Ref Range Status   Specimen Description BLOOD RIGHT HAND  Final   Special Requests   Final    BOTTLES DRAWN AEROBIC AND ANAEROBIC Blood Culture results may not be optimal due to an inadequate volume of blood received in culture bottles   Culture   Final    NO GROWTH < 24 HOURS Performed at [facility], [place] [address], [place], [place] [address]    Report Status PENDING  Incomplete    Impression/Plan:  1. Necrotizing pancreatitis with abscess formation - s/p IR drain and remains in place.  Cultures noted and positive for Klebsiella and Citrobacter.  Minimal drainage now and plan by IR to continue with drainage catheter for 10-14 days with follow up in the drain clinic.  On meropenem and will continue.  Plan to use oral therapy at discharge.   2.  Pericardial effusion and tamponade - s/p pericardial drain placement and culture growth with VRE and on daptomycin.  Will consider linezolid at discharge.  14 days total treatment  3.  Medication monitoring - creat stable.  Will continue to monitor.   3.

## 2021-05-09 NOTE — Progress Notes (Signed)
°  Pancreatic pseudocyst drain  Drain Location: RUQ Size: Fr size: 12 Fr Date of placement: 04/18/21  Currently to: Drain collection device: gravity 24 hour output:  Output by Drain (mL) 05/07/21 0701 - 05/07/21 1900 05/07/21 1901 - 05/08/21 0700 05/08/21 0701 - 05/08/21 1900 05/08/21 1901 - 05/09/21 0700 05/09/21 0701 - 05/09/21 0720  Closed System Drain 1 Right RLQ Other (Comment) 12 Fr. 10   10     Interval imaging/drain manipulation:  CT yesterday: IMPRESSION: 1. Persistent fluid and gas collection associated with the pancreas. Inflammation surrounding these collections is decreased. Size unchanged 2. One fluid/gas collection adjacent to the head of the pancreas approximates the transverse colon. Concern for communication with the transverse colon which would help explain the gas within the fluid collections ventral to the pancreas. 3. Fluid collections inferior to the pancreas are unchanged. 4. Fluid collection in the RIGHT iliac fossa with drain in place is unchanged 5. Interval marked reduction in pericardial effusion. 6. Small RIGHT pleural effusion.    Current examination: Flushes/aspirates easily.  Insertion site unremarkable. Suture and stat lock in place. Dressed appropriately.  - flushed today easily No pain--- C/D/I OP pus like-- milky yellow 04/23/2021 FINAL    Organism ID, Bacteria KLEBSIELLA PNEUMONIAE   Organism ID, Bacteria CITROBACTER FREUNDII     Plan: Continue TID flushes with 5 cc NS Record output Q shift. Dressing changes QD or PRN if soiled.  Call IR APP or on call IR MD if difficulty flushing or sudden change in drain output.  Repeat imaging/possible drain injection once output < 10 mL/QD (excluding flush material.)  Discharge planning: Please contact IR APP or on call IR MD prior to patient d/c to ensure appropriate follow up plans are in place. Typically patient will follow up with IR clinic 10-14 days post d/c for repeat imaging/possible drain  injection. IR scheduler will contact patient with date/time of appointment. Patient will need to flush drain QD with 5 cc NS, record output QD, dressing changes every 2-3 days or earlier if soiled.   IR will continue to follow - please call with questions or concerns.

## 2021-05-09 NOTE — Care Management Important Message (Signed)
Important Message  Patient Details  Name: Scott Foley MRN: [id number] Date of Birth: 1965-11-01   Medicare Important Message Given:  Yes     Shelda Altes 05/09/2021, 9:39 AM

## 2021-05-09 NOTE — Progress Notes (Signed)
Progress Note  Patient Name: Scott Foley Date of Encounter: 05/09/2021  [facility] Cardiologist: Sanda Klein, MD   Subjective   Currently resting fairly comfortably.  He still "feels his pancreas ".  He is able to eat some.  Echocardiogram is about to be performed.  Limited to look at pericardial effusion.  Inpatient Medications    Scheduled Meds:  apixaban  5 mg Oral BID   clopidogrel  75 mg Oral Daily   feeding supplement  237 mL Oral BID BM   fluticasone furoate-vilanterol  1 puff Inhalation Daily   And   umeclidinium bromide  1 puff Inhalation Daily   metoprolol tartrate  37.5 mg Oral BID   multivitamin with minerals  1 tablet Oral Daily   pantoprazole  40 mg Oral BID   sodium chloride flush  5 mL Intracatheter Q8H   vitamin B-12  1,000 mcg Oral Daily   Continuous Infusions:  sodium chloride     DAPTOmycin (CUBICIN)  IV 700 mg (05/08/21 2216)   meropenem (MERREM) IV 1 g (05/09/21 0918)   PRN Meds: acetaminophen, albuterol, morphine injection, ondansetron **OR** ondansetron (ZOFRAN) IV, oxyCODONE   Vital Signs    Vitals:   05/08/21 2230 05/09/21 0641 05/09/21 0745 05/09/21 0838  BP: (!) 136/94 128/90 (!) 135/93   Pulse: 99 97 96   Resp: (!) 21 20 20    Temp: 97.6 F (36.4 C) 98 F (36.7 C) 97.7 F (36.5 C)   TempSrc: Oral Oral Oral   SpO2: 95% 98% 99% 98%  Weight:  79.2 kg    Height:        Intake/Output Summary (Last 24 hours) at 05/09/2021 1106 Last data filed at 05/09/2021 0918 Gross per 24 hour  Intake 370 ml  Output 910 ml  Net -540 ml   Last 3 Weights 05/09/2021 05/08/2021 05/07/2021  Weight (lbs) 174 lb 9.7 oz 174 lb 11.2 oz 177 lb 14.6 oz  Weight (kg) 79.2 kg 79.243 kg 80.7 kg      Telemetry    Heart rate sinus rhythm 91 currently.  Has bouts of tachycardia previously- Personally Reviewed  ECG    No new- Personally Reviewed  Physical Exam   GEN: No acute distress.   Neck: No JVD Cardiac: RRR GI: non-distended  MS:  No edema; No deformity. Neuro:  Nonfocal  Psych: Normal affect   Labs    High Sensitivity Troponin:  No results for input(s): TROPONINIHS in the last 720 hours.   Chemistry Recent Labs  Lab 05/04/21 0232 05/05/21 0321 05/07/21 0357 05/08/21 0211 05/09/21 0159  NA 131*   < > 134* 134* 134*  K 3.6   < > 4.2 3.8 3.9  CL 98   < > 101 102 103  CO2 24   < > 24 22 22   GLUCOSE 96   < > 90 92 90  BUN 26*   < > 19 22* 19  CREATININE 0.94   < > 1.05 1.06 0.89  CALCIUM 8.6*   < > 9.3 9.1 9.3  MG 1.7  --   --   --   --   GFRNONAA >60   < > >60 >60 >60  ANIONGAP 9   < > 9 10 9    < > = values in this interval not displayed.    Lipids No results for input(s): CHOL, TRIG, HDL, LABVLDL, LDLCALC, CHOLHDL in the last 168 hours.  Hematology Recent Labs  Lab 05/05/21 0321 05/07/21 0357 05/09/21  0159  WBC 17.5* 16.9* 12.3*  RBC 3.21* 3.69* 3.54*  HGB 9.3* 10.3* 10.0*  HCT 27.4* 31.3* 29.7*  MCV 85.4 84.8 83.9  MCH 29.0 27.9 28.2  MCHC 33.9 32.9 33.7  RDW 16.9* 16.8* 16.5*  PLT 334 399 431*   Thyroid No results for input(s): TSH, FREET4 in the last 168 hours.  BNPNo results for input(s): BNP, PROBNP in the last 168 hours.  DDimer No results for input(s): DDIMER in the last 168 hours.   Radiology    CT ABDOMEN PELVIS W CONTRAST  Result Date: 05/08/2021 CLINICAL DATA:  Follow-up necrotizing pancreatitis. RIGHT lower quadrant abdominal pain. Evaluate percutaneous drainage catheter. EXAM: CT ABDOMEN AND PELVIS WITH CONTRAST TECHNIQUE: Multidetector CT imaging of the abdomen and pelvis was performed using the standard protocol following bolus administration of intravenous contrast. RADIATION DOSE REDUCTION: This exam was performed according to the departmental dose-optimization program which includes automated exposure control, adjustment of the mA and/or kV according to patient size and/or use of iterative reconstruction technique. CONTRAST:  38mL OMNIPAQUE IOHEXOL 300 MG/ML  SOLN  COMPARISON:  CT 04/28/2021 FINDINGS: Lower chest: Interval reduction in pericardial effusion. Mild effusion remains along the RIGHT heart border measuring only 8 mm in depth. Hepatobiliary: Postcholecystectomy. Pneumobilia within the LEFT hepatic lobe common bile duct similar prior. Pancreas: Again demonstrated walled-off fluid collections adjacent to the pancreas with interspersed gas. The degree of inflammation in the retroperitoneal fat adjacent to collections is decreased. Collection mid body the pancreas measures 4.3 x 3.0 cm compared with 4.8 by 3.2 cm remeasured. This collection now has gas where previously only fluid. A second gas and fluid collection adjacent the head of the pancreas measures 3.8 x 2.6 cm (image 33/series 3) compared to 4.3 x 3.0 cm for no interval change. This collection approaches the transverse colon (image 32/3). Cannot completely exclude communication with this gas and fluid collection with the transverse colon. Approximation between the collection in transverse colon is also seen on sagittal image 56/8. Several walled-off fluid collection inferior to the pancreas in the RIGHT upper quadrant are unchanged (image 43/series 3). In the RIGHT lower quadrant fluid collection in the RIGHT iliac fossa measures 3.2 cm (image 57/3) slightly decreased from 3.6 cm. Pigtail catheter remains within this collection Spleen: Normal spleen Adrenals/urinary tract: Adrenal glands and kidneys are normal. The ureters and bladder normal. Stomach/Bowel: Stomach, duodenum and small bowel normal. Appendix not identified. Ascending colon transverse colon normal. Transverse colon approximates the peripancreatic fluid collection as described above. LEFT colon rectum normal. Vascular/Lymphatic: Abdominal aorta is normal caliber with atherosclerotic calcification. There is no retroperitoneal or periportal lymphadenopathy. No pelvic lymphadenopathy. Reproductive: Unremarkable Other: Nodularity along the  retroperitoneum of the LEFT along the perinephric space (is 41/3 is unchanged. Favor inflammatory Musculoskeletal: No aggressive osseous lesion. IMPRESSION: 1. Persistent fluid and gas collection associated with the pancreas. Inflammation surrounding these collections is decreased. Size unchanged 2. One fluid/gas collection adjacent to the head of the pancreas approximates the transverse colon. Concern for communication with the transverse colon which would help explain the gas within the fluid collections ventral to the pancreas. 3. Fluid collections inferior to the pancreas are unchanged. 4. Fluid collection in the RIGHT iliac fossa with drain in place is unchanged 5. Interval marked reduction in pericardial effusion. 6. Small RIGHT pleural effusion. Electronically Signed   By: Suzy Bouchard M.D.   On: 05/08/2021 14:31    Cardiac Studies   Limited echocardiogram 05/30/2021 post pericardiocentesis-trivial effusion.  Original echocardiogram 04/30/2021  showed large pericardial effusion  Patient Profile     56 y.o. male with a large pericardial effusion in the setting of severe acute pancreatitis sepsis on anticoagulation with Eliquis, stable CAD.  Assessment & Plan    Large pericardial effusion with tamponade, hypotension, cardiogenic shock - Successful drain placement from the 19th to the 22nd.  Excellent resolution of effusion. -Exudative by LDL, pericardial fluid grew gram-positive cocci, Enterococcus faecium. -Appreciate infectious disease antibiotics.  Notes reviewed.  Daptomycin, meropenem -Limited echocardiogram about to be performed.  We will follow-up with results.  Sinus tachycardia - Underlying illness.  No further evidence of cardiac tamponade.  Metoprolol currently 37.5 twice daily.  Thought was to increase metoprolol to 50 twice a day as long as there is no significant increase in pericardial effusion.  Necrotizing pancreatitis/sepsis - Primary team notes reviewed.  Seems to be  improving.  History of pulmonary embolism - Eliquis has been restarted.  Stable.  History of stroke - Plavix has been restarted as well.  I will watch for any signs of bleeding.    For questions or updates, please contact [place] Please consult [email or web address] for contact info under        Signed, Candee Furbish, MD  05/09/2021, 11:06 AM

## 2021-05-09 NOTE — Progress Notes (Addendum)
Physical Therapy Treatment Patient Details Name: Scott Foley MRN: [id number] DOB: March 14, 1965 Today's Date: 05/09/2021   History of Present Illness Pt is a 56 y.o. M who presents 04/13/2021 with abdominal pain. Admitted with acute pancreatitis with sepsis. S/p lap chole 03/02/2021 with abnormal IOC; underwent ERCP 12/22/222. S/p IR pec drain. Underwent pericardiocentesis on 2/19.  Significant PMH: CAD, HTN, HLD, COPD, PE, CVA, IDA, tobacco abuse.    PT Comments    Patient progressing well towards PT goals. Improved ambulation distance today with supervision for safety; holding onto IV pole for support to simulate SPC. Will bring [facility] next session for gait training. Plan for stair training next session as well. HR up to 129 bpm max with activity with 3/4 DOE, needing to stop talking to breathe. Reports the recliner in room is not comfortable so declines sitting up OOB. Encouraged walking 2 more times today with staff to improve endurance and overall strength/mobility as pt reports he feels at 50-75% of his functional baseline. Will follow.    Recommendations for follow up therapy are one component of a multi-disciplinary discharge planning process, led by the attending physician.  Recommendations may be updated based on patient status, additional functional criteria and insurance authorization.  Follow Up Recommendations  Outpatient PT     Assistance Recommended at Discharge PRN  Patient can return home with the following A little help with walking and/or transfers;A little help with bathing/dressing/bathroom;Help with stairs or ramp for entrance   Equipment Recommendations  Cane    Recommendations for Other Services       Precautions / Restrictions Precautions Precautions: Fall;Other (comment) Precaution Comments: RLQ drain, watch HR Restrictions Weight Bearing Restrictions: No     Mobility  Bed Mobility Overal bed mobility: Modified Independent             General  bed mobility comments: NO assist needed.    Transfers Overall transfer level: Needs assistance Equipment used: None Transfers: Sit to/from Stand Sit to Stand: Supervision           General transfer comment: Supervision for safety, aware of RLQ drain. Some mild instability in standing with sway but no overt LOB.    Ambulation/Gait Ambulation/Gait assistance: Supervision Gait Distance (Feet): 400 Feet Assistive device: IV Pole Gait Pattern/deviations: Step-through pattern, Decreased stride length, Narrow base of support Gait velocity: decreased     General Gait Details: Slow, mostly steady gait with IV pole for support to similate SPC. 3/4 DOE. HR up to 129 bpm max with activity. No rest breaks.   Stairs             Wheelchair Mobility    Modified Rankin (Stroke Patients Only)       Balance Overall balance assessment: Mild deficits observed, not formally tested                                          Cognition Arousal/Alertness: Awake/alert Behavior During Therapy: WFL for tasks assessed/performed Overall Cognitive Status: Within Functional Limits for tasks assessed                                          Exercises      General Comments General comments (skin integrity, edema, etc.): HR up to 129 bpm max with activity with  3/4 DOE, needing to stop talking to breathe.      Pertinent Vitals/Pain Pain Assessment Pain Assessment: No/denies pain    Home Living                          Prior Function            PT Goals (current goals can now be found in the care plan section) Progress towards PT goals: Progressing toward goals    Frequency    Min 2X/week      PT Plan Discharge plan needs to be updated    Co-evaluation              AM-PAC PT "6 Clicks" Mobility   Outcome Measure  Help needed turning from your back to your side while in a flat bed without using bedrails?: None Help  needed moving from lying on your back to sitting on the side of a flat bed without using bedrails?: None Help needed moving to and from a bed to a chair (including a wheelchair)?: A Little Help needed standing up from a chair using your arms (e.g., wheelchair or bedside chair)?: A Little Help needed to walk in hospital room?: A Little Help needed climbing 3-5 steps with a railing? : A Little 6 Click Score: 20    End of Session Equipment Utilized During Treatment: Gait belt Activity Tolerance: Patient tolerated treatment well;Other (comment) (dyspnea) Patient left: in bed;with call bell/phone within reach Nurse Communication: Mobility status PT Visit Diagnosis: Unsteadiness on feet (R26.81);Muscle weakness (generalized) (M62.81);Difficulty in walking, not elsewhere classified (R26.2)     Time: 7829-5621 PT Time Calculation (min) (ACUTE ONLY): 21 min  Charges:  $Therapeutic Exercise: 8-22 mins                     Marisa Severin, PT, DPT Acute Rehabilitation Services Pager [phone] Office [phone]      Marguarite Arbour A Sabra Heck 05/09/2021, 9:34 AM

## 2021-05-09 NOTE — Progress Notes (Signed)
°  Echocardiogram 2D Echocardiogram has been performed.  Scott Foley 05/09/2021, 11:46 AM

## 2021-05-10 ENCOUNTER — Other Ambulatory Visit (HOSPITAL_COMMUNITY): Payer: Self-pay

## 2021-05-10 DIAGNOSIS — K8591 Acute pancreatitis with uninfected necrosis, unspecified: Secondary | ICD-10-CM | POA: Diagnosis not present

## 2021-05-10 DIAGNOSIS — J410 Simple chronic bronchitis: Secondary | ICD-10-CM | POA: Diagnosis not present

## 2021-05-10 DIAGNOSIS — I3139 Other pericardial effusion (noninflammatory): Secondary | ICD-10-CM | POA: Diagnosis not present

## 2021-05-10 DIAGNOSIS — E876 Hypokalemia: Secondary | ICD-10-CM | POA: Diagnosis not present

## 2021-05-10 MED ORDER — PANTOPRAZOLE SODIUM 40 MG PO TBEC
40.0000 mg | DELAYED_RELEASE_TABLET | Freq: Two times a day (BID) | ORAL | 1 refills | Status: DC
  Filled 2021-05-10 – 2021-05-18 (×2): qty 60, 30d supply, fill #0
  Filled 2021-05-18: qty 60, 30d supply, fill #1

## 2021-05-10 MED ORDER — LEVOFLOXACIN 750 MG PO TABS
750.0000 mg | ORAL_TABLET | Freq: Every day | ORAL | 0 refills | Status: AC
  Filled 2021-05-10: qty 14, 14d supply, fill #0

## 2021-05-10 MED ORDER — ENSURE ENLIVE PO LIQD
237.0000 mL | Freq: Two times a day (BID) | ORAL | 0 refills | Status: AC
Start: 2021-05-10 — End: 2021-06-09
  Filled 2021-05-10: qty 14220, 30d supply, fill #0

## 2021-05-10 MED ORDER — ADULT MULTIVITAMIN W/MINERALS CH: 1.0000 | ORAL_TABLET | Freq: Every day | ORAL | Status: AC

## 2021-05-10 MED ORDER — SODIUM CHLORIDE 0.9% FLUSH
10.0000 mL | INTRAVENOUS | 5 refills | Status: AC | PRN
  Filled 2021-05-10: qty 120, 14d supply, fill #0

## 2021-05-10 MED ORDER — LINEZOLID 600 MG PO TABS
600.0000 mg | ORAL_TABLET | Freq: Two times a day (BID) | ORAL | 0 refills | Status: AC
  Filled 2021-05-10: qty 28, 14d supply, fill #0

## 2021-05-10 MED ORDER — CYANOCOBALAMIN 1000 MCG PO TABS
1000.0000 ug | ORAL_TABLET | Freq: Every day | ORAL | 1 refills | Status: DC
  Filled 2021-05-10: qty 30, 30d supply, fill #0

## 2021-05-10 MED ORDER — LEVOFLOXACIN 500 MG PO TABS
500.0000 mg | ORAL_TABLET | Freq: Every day | ORAL | 0 refills | Status: DC
  Filled 2021-05-10: qty 14, 14d supply, fill #0

## 2021-05-10 NOTE — Progress Notes (Signed)
Nutrition Follow-up  DOCUMENTATION CODES:   Severe malnutrition in context of acute illness/injury  INTERVENTION:  - Discontinue Ensure per pt refusal   - Continue MVI with minerals daily   - Encourage PO intake    NUTRITION DIAGNOSIS:   Severe Malnutrition related to acute illness (pancreatitis) as evidenced by moderate muscle depletion, moderate fat depletion, energy intake < or equal to 50% for > or equal to 5 days, percent weight loss (11% weight loss within 3 months).  Ongoing   GOAL:   Patient will meet greater than or equal to 90% of their needs  Progressing   MONITOR:   PO intake, Supplement acceptance, Labs, Weight trends  REASON FOR ASSESSMENT:   New TF    ASSESSMENT:   56 yo male admitted with severe acute pancreatitis with sepsis, AKI. PMH includes CAD, HTN, PE, HLD, CVA, IDA, pancreatitis, chronic respiratory failure.  2/03 - Cortrak placed then advanced by fluoroscopy into the distal duodenum/proximal jejunum 2/06 - IR consulted for percutaneous drainage 2/07 - diet advanced to full liquids  2/10 - diet advanced to Heart Healthy 2/16 - CT scan of the abdomen pelvis was repeated, showed stable findings  2/17 - Cortrak removed, TF discontinued 2/19 - Pericardiocentesis with 725 mL removed,  drain placement 2/21 - Diet advanced to regular   Meal completions: 60-100% x 4 days   Met with pt at bedside. Per pt, pt reports a good appetite today and that his appetite has been getting better since his symptoms improved. Pt denies any nausea, vomiting or constipation. Pt reports that for breakfast today he had rice crispies cereal and fruit cup and tolerated well. Per pt, he does not like the Ensure supplements and refused it this morning. Discussed with pt providing snacks between meals, however, pt reports that he does not feel hungry between meals or at HS and pt refuses snacks. Encouraged adequate PO intake with pt for healing and to meet nutritional needs.    Medications reviewed and include: ensure BID, MVI, protonix, vitamin B-12  Labs reviewed:  Sodium: 134 (L) Potassium: 3.9 (wdl)    Diet Order:   Diet Order             Diet regular Room service appropriate? Yes; Fluid consistency: Thin  Diet effective now                   EDUCATION NEEDS:   Education needs have been addressed  Skin:  Skin Assessment: Reviewed RN Assessment  Last BM:  2/24; type 5  Height:   Ht Readings from Last 1 Encounters:  04/19/21 _0  (1.626 m)    Weight:   Wt Readings from Last 1 Encounters:  05/09/21 79.2 kg    BMI:  Body mass index is 29.97 kg/m.  Estimated Nutritional Needs:   Kcal:  2300-2500  Protein:  120 gm  Fluid:  2.3-2.5 L    Maryruth Hancock, Dietetic Intern 05/10/2021 1:00 PM

## 2021-05-10 NOTE — Progress Notes (Signed)
Patient discharging home with assistance of wife. IV removed without complications. Tele removed and CCMD notified. Discharge instructions given and drain supplies provided. Medication administration discussed. All questions answered. Waiting for TOC to deliver meds.  [place] Mannie Ohlin

## 2021-05-10 NOTE — TOC Benefit Eligibility Note (Signed)
Patient Teacher, English as a foreign language completed.    The patient is currently admitted and upon discharge could be taking linezolid (Zyvox) 600 mg tablets.  The current 30 day co-pay is, $95.00.   The current 14 day co-pay is, $73.08.   The patient is insured through [place], [employer] Patient Advocate Specialist [place] Patient Advocate Team Direct Number: [phone]  Fax: [phone]

## 2021-05-10 NOTE — Progress Notes (Signed)
Progress Note  Patient Name: Scott Foley Date of Encounter: 05/10/2021  [facility] Cardiologist: Sanda Klein, MD   Subjective   New cardiac complaints.  Discussed with infectious disease team.  VRE in pericardium.  Wife in room.  Inpatient Medications    Scheduled Meds:  apixaban  5 mg Oral BID   clopidogrel  75 mg Oral Daily   feeding supplement  237 mL Oral BID BM   fluticasone furoate-vilanterol  1 puff Inhalation Daily   And   umeclidinium bromide  1 puff Inhalation Daily   metoprolol tartrate  37.5 mg Oral BID   multivitamin with minerals  1 tablet Oral Daily   pantoprazole  40 mg Oral BID   sodium chloride flush  5 mL Intracatheter Q8H   vitamin B-12  1,000 mcg Oral Daily   Continuous Infusions:  sodium chloride     DAPTOmycin (CUBICIN)  IV Stopped (05/09/21 2112)   meropenem (MERREM) IV 1 g (05/10/21 0846)   PRN Meds: acetaminophen, albuterol, morphine injection, ondansetron **OR** ondansetron (ZOFRAN) IV, oxyCODONE   Vital Signs    Vitals:   05/09/21 2104 05/09/21 2357 05/10/21 0749 05/10/21 0910  BP: (!) 128/91 119/90 (!) 123/93   Pulse:  89 95   Resp: 20 19 19    Temp: 98.1 F (36.7 C) 97.7 F (36.5 C) 97.7 F (36.5 C)   TempSrc: Oral Oral Oral   SpO2:   94% 95%  Weight:      Height:        Intake/Output Summary (Last 24 hours) at 05/10/2021 1009 Last data filed at 05/10/2021 0506 Gross per 24 hour  Intake 617 ml  Output 1397 ml  Net -780 ml   Last 3 Weights 05/09/2021 05/08/2021 05/07/2021  Weight (lbs) 174 lb 9.7 oz 174 lb 11.2 oz 177 lb 14.6 oz  Weight (kg) 79.2 kg 79.243 kg 80.7 kg      Telemetry    Sinus rhythm 90s, occasional tachycardia- Personally Reviewed    Physical Exam   Overall comfortable in bed.  Able to complete full senses without difficulty.  Wife at bedside.  Normal respiratory effort.  Labs    High Sensitivity Troponin:  No results for input(s): TROPONINIHS in the last 720 hours.    Chemistry Recent Labs  Lab 05/04/21 0232 05/05/21 0321 05/07/21 0357 05/08/21 0211 05/09/21 0159  NA 131*   < > 134* 134* 134*  K 3.6   < > 4.2 3.8 3.9  CL 98   < > 101 102 103  CO2 24   < > 24 22 22   GLUCOSE 96   < > 90 92 90  BUN 26*   < > 19 22* 19  CREATININE 0.94   < > 1.05 1.06 0.89  CALCIUM 8.6*   < > 9.3 9.1 9.3  MG 1.7  --   --   --   --   GFRNONAA >60   < > >60 >60 >60  ANIONGAP 9   < > 9 10 9    < > = values in this interval not displayed.    Lipids No results for input(s): CHOL, TRIG, HDL, LABVLDL, LDLCALC, CHOLHDL in the last 168 hours.  Hematology Recent Labs  Lab 05/05/21 0321 05/07/21 0357 05/09/21 0159  WBC 17.5* 16.9* 12.3*  RBC 3.21* 3.69* 3.54*  HGB 9.3* 10.3* 10.0*  HCT 27.4* 31.3* 29.7*  MCV 85.4 84.8 83.9  MCH 29.0 27.9 28.2  MCHC 33.9 32.9 33.7  RDW 16.9*  16.8* 16.5*  PLT 334 399 431*   Thyroid No results for input(s): TSH, FREET4 in the last 168 hours.  BNPNo results for input(s): BNP, PROBNP in the last 168 hours.  DDimer No results for input(s): DDIMER in the last 168 hours.   Radiology    CT ABDOMEN PELVIS W CONTRAST  Result Date: 05/08/2021 CLINICAL DATA:  Follow-up necrotizing pancreatitis. RIGHT lower quadrant abdominal pain. Evaluate percutaneous drainage catheter. EXAM: CT ABDOMEN AND PELVIS WITH CONTRAST TECHNIQUE: Multidetector CT imaging of the abdomen and pelvis was performed using the standard protocol following bolus administration of intravenous contrast. RADIATION DOSE REDUCTION: This exam was performed according to the departmental dose-optimization program which includes automated exposure control, adjustment of the mA and/or kV according to patient size and/or use of iterative reconstruction technique. CONTRAST:  34mL OMNIPAQUE IOHEXOL 300 MG/ML  SOLN COMPARISON:  CT 04/28/2021 FINDINGS: Lower chest: Interval reduction in pericardial effusion. Mild effusion remains along the RIGHT heart border measuring only 8 mm in depth.  Hepatobiliary: Postcholecystectomy. Pneumobilia within the LEFT hepatic lobe common bile duct similar prior. Pancreas: Again demonstrated walled-off fluid collections adjacent to the pancreas with interspersed gas. The degree of inflammation in the retroperitoneal fat adjacent to collections is decreased. Collection mid body the pancreas measures 4.3 x 3.0 cm compared with 4.8 by 3.2 cm remeasured. This collection now has gas where previously only fluid. A second gas and fluid collection adjacent the head of the pancreas measures 3.8 x 2.6 cm (image 33/series 3) compared to 4.3 x 3.0 cm for no interval change. This collection approaches the transverse colon (image 32/3). Cannot completely exclude communication with this gas and fluid collection with the transverse colon. Approximation between the collection in transverse colon is also seen on sagittal image 56/8. Several walled-off fluid collection inferior to the pancreas in the RIGHT upper quadrant are unchanged (image 43/series 3). In the RIGHT lower quadrant fluid collection in the RIGHT iliac fossa measures 3.2 cm (image 57/3) slightly decreased from 3.6 cm. Pigtail catheter remains within this collection Spleen: Normal spleen Adrenals/urinary tract: Adrenal glands and kidneys are normal. The ureters and bladder normal. Stomach/Bowel: Stomach, duodenum and small bowel normal. Appendix not identified. Ascending colon transverse colon normal. Transverse colon approximates the peripancreatic fluid collection as described above. LEFT colon rectum normal. Vascular/Lymphatic: Abdominal aorta is normal caliber with atherosclerotic calcification. There is no retroperitoneal or periportal lymphadenopathy. No pelvic lymphadenopathy. Reproductive: Unremarkable Other: Nodularity along the retroperitoneum of the LEFT along the perinephric space (is 41/3 is unchanged. Favor inflammatory Musculoskeletal: No aggressive osseous lesion. IMPRESSION: 1. Persistent fluid and gas  collection associated with the pancreas. Inflammation surrounding these collections is decreased. Size unchanged 2. One fluid/gas collection adjacent to the head of the pancreas approximates the transverse colon. Concern for communication with the transverse colon which would help explain the gas within the fluid collections ventral to the pancreas. 3. Fluid collections inferior to the pancreas are unchanged. 4. Fluid collection in the RIGHT iliac fossa with drain in place is unchanged 5. Interval marked reduction in pericardial effusion. 6. Small RIGHT pleural effusion. Electronically Signed   By: Suzy Bouchard M.D.   On: 05/08/2021 14:31   ECHOCARDIOGRAM LIMITED  Result Date: 05/09/2021    ECHOCARDIOGRAM LIMITED REPORT   Patient Name:   Scott Foley Date of Exam: 05/09/2021 Medical Rec #:  [id number]             Height:       64.0 in Accession #:  [id number]            Weight:       174.6 lb Date of Birth:  [birth year]             BSA:          1.847 m Patient Age:    56 years              BP:           151/89 mmHg Patient Gender: M                     HR:           88 bpm. Exam Location:  Inpatient Procedure: 2D Echo, Limited Echo and Limited Color Doppler Indications:    Pericardial effusions  History:        Patient has prior history of Echocardiogram examinations. CHF,                 COPD, Signs/Symptoms:Chest Pain; Risk Factors:Hypertension and                 Dyslipidemia. 05/01/2021 Pericardiocentensis.  Sonographer:    Clayton Lefort RDCS (AE) Referring Phys: 1993 [facility] G BARRETT  Sonographer Comments: Multiple bandages in PLAX and subcostal windows. IMPRESSIONS  1. Left ventricular ejection fraction, by estimation, is 55 to 60%. The left ventricle has normal function.  2. Right ventricular systolic function is normal.  3. There is a trivial pericardial effusion mainly around the RV free wall consistent with prior limited TTE on 05/02/21.  4. The mitral valve is grossly normal.  5. The aortic  valve is tricuspid. There is mild thickening of the aortic valve. Aortic valve regurgitation is not visualized. Aortic valve sclerosis is present, with no evidence of aortic valve stenosis. Comparison(s): Compared to prior limited TTE on 05/02/21, there is no significant change. The pericardial effusion remains trivial. FINDINGS  Left Ventricle: Left ventricular ejection fraction, by estimation, is 55 to 60%. The left ventricle has normal function. Right Ventricle: Right ventricular systolic function is normal. Pericardium: There is a trivial pericardial effusion mainly around the RV free wall consistent with prior limited TTE on 05/02/21. Mitral Valve: The mitral valve is grossly normal. Tricuspid Valve: The tricuspid valve is normal in structure. Tricuspid valve regurgitation is trivial. Aortic Valve: The aortic valve is tricuspid. There is mild thickening of the aortic valve. Aortic valve regurgitation is not visualized. Aortic valve sclerosis is present, with no evidence of aortic valve stenosis. Pulmonic Valve: The pulmonic valve was normal in structure. Pulmonic valve regurgitation is trivial. IVC IVC diam: 1.60 cm Gwyndolyn Kaufman MD Electronically signed by Gwyndolyn Kaufman MD Signature Date/Time: 10-May-2021/2:58:01 PM    Final     Cardiac Studies   Limited echocardiogram 05/10/2021-trivial pericardial effusion normal ejection fraction.  Patient Profile     56 y.o. male with large pericardial effusion in the setting of severe acute pancreatitis, sepsis, on anticoagulation with Eliquis secondary to prior pulmonary embolism, stable coronary artery disease  Assessment & Plan    Pericardial effusion - Status post successful drain placement on the 19th and removed on the 22nd.  Resolution of effusion.  Repeat echocardiograms have shown only trivial effusion present.  Excellent. - Effusion was exudative by LDL, grew gram-positive cocci with Enterococcus faecium.  Vancomycin-resistant Enterococcus. -  Appreciate infectious disease team, Dr. Linus Salmons and his associates with use of daptomycin, meropenem.  Sinus tachycardia - Improved.  Heart rate currently 95.  No  evidence of increasing effusion.  Necrotizing pancreatitis sepsis - Seems to be improving.  Primary team notes reviewed.  History of pulmonary embolism - Eliquis has been restarted no evidence of bleeding.  Anemia noted.  Improved.  History of stroke - Plavix has been started as well.  Stable.  No signs of bleeding.    For questions or updates, please contact [place] Please consult [email or web address] for contact info under        Signed, Candee Furbish, MD  05/10/2021, 10:09 AM

## 2021-05-10 NOTE — Progress Notes (Signed)
Mobility Specialist Progress Note   05/10/21 1426  Mobility  Activity Stood at bedside  Level of Assistance Independent after set-up  Assistive Device None  Distance Ambulated (ft) 2 ft  Activity Response Tolerated well  $Mobility charge 1 Mobility   Pt wanting to save energy for d/c but agreeable to core and stability exercises standing at bedside. Tested center of balance for ~5 mins and no problems/ faults were observed. Returned BTB in prep for d/c home and awaiting medications.   Holland Falling Mobility Specialist Phone Number [phone]

## 2021-05-10 NOTE — Progress Notes (Signed)
[facility] for Infectious Disease   Reason for visit: follow up on necrotizing pancreatitis  Interval History: no new concerns; follow up echo with continued resolution of the effusion; remains afebrile. Wife at bedside.   Physical Exam: Constitutional:  Vitals:   05/10/21 0910 05/10/21 1103  BP:  122/90  Pulse:  82  Resp:  20  Temp:  98 F (36.7 C)  SpO2: 95% 95%   Patient is in nad Respiratory: normal respiratory effort GI: soft; drain in place  Review of Systems: Constitutional: negative for fever or chills Gastrointestinal: negative for diarrhea  Lab Results  Component Value Date   WBC 12.3 (H) 05/09/2021   HGB 10.0 (L) 05/09/2021   HCT 29.7 (L) 05/09/2021   MCV 83.9 05/09/2021   PLT 431 (H) 05/09/2021    Lab Results  Component Value Date   CREATININE 0.89 05/09/2021   BUN 19 05/09/2021   NA 134 (L) 05/09/2021   K 3.9 05/09/2021   CL 103 05/09/2021   CO2 22 05/09/2021    Lab Results  Component Value Date   ALT 17 04/18/2021   AST 24 04/18/2021   ALKPHOS 77 04/18/2021     Microbiology: Recent Results (from the past 240 hour(s))  Surgical PCR screen     Status: None   Collection Time: 05/01/21 11:34 AM   Specimen: Nasal Mucosa; Nasal Swab  Result Value Ref Range Status   MRSA, PCR NEGATIVE NEGATIVE Final   Staphylococcus aureus NEGATIVE NEGATIVE Final    Comment: (NOTE) The Xpert SA Assay (FDA approved for NASAL specimens in patients 23 years of age and older), is one component of a comprehensive surveillance program. It is not intended to diagnose infection nor to guide or monitor treatment. Performed at [facility], [place] [address], [place], [place] [address]   Culture, body fluid w Gram Stain-bottle     Status: Abnormal   Collection Time: 05/01/21 12:42 PM   Specimen: Pericardial  Result Value Ref Range Status   Specimen Description PERICARDIAL FLUID  Final   Special Requests SPEC A  Final   Gram Stain   Final    GRAM  POSITIVE COCCI IN BOTH AEROBIC AND ANAEROBIC BOTTLES CRITICAL RESULT CALLED TO, READ BACK BY AND VERIFIED WITH: T ARMSTRONG,RN @0300  05/02/21 [place] Performed at [facility], [place] [address] [place], [place] [address]    Culture (A)  Final    ENTEROCOCCUS FAECIUM VANCOMYCIN RESISTANT ENTEROCOCCUS ENTEROCOCCUS FAECALIS    Report Status 05/06/2021 FINAL  Final   Organism ID, Bacteria ENTEROCOCCUS FAECIUM  Final   Organism ID, Bacteria ENTEROCOCCUS FAECALIS  Final      Susceptibility   Enterococcus faecalis - MIC*    AMPICILLIN <=2 SENSITIVE Sensitive     VANCOMYCIN 1 SENSITIVE Sensitive     GENTAMICIN SYNERGY SENSITIVE Sensitive     * ENTEROCOCCUS FAECALIS   Enterococcus faecium - MIC*    AMPICILLIN >=32 RESISTANT Resistant     VANCOMYCIN >=32 RESISTANT Resistant     GENTAMICIN SYNERGY SENSITIVE Sensitive     LINEZOLID 2 SENSITIVE Sensitive     * ENTEROCOCCUS FAECIUM  Gram stain     Status: None   Collection Time: 05/01/21 12:42 PM   Specimen: Pericardial  Result Value Ref Range Status   Specimen Description PERICARDIAL FLUID  Final   Special Requests SPEC A  Final   Gram Stain   Final    FEW WBC PRESENT,BOTH PMN AND MONONUCLEAR NO ORGANISMS SEEN  Performed at [facility], [place] [address], [place], [place] [address]    Report Status 05/01/2021 FINAL  Final  Culture, blood (Routine X 2) w Reflex to ID Panel     Status: None (Preliminary result)   Collection Time: 05/08/21  9:55 AM   Specimen: BLOOD LEFT HAND  Result Value Ref Range Status   Specimen Description BLOOD LEFT HAND  Final   Special Requests   Final    BOTTLES DRAWN AEROBIC ONLY Blood Culture results may not be optimal due to an inadequate volume of blood received in culture bottles   Culture   Final    NO GROWTH 2 DAYS Performed at [facility], [place] [address] [place], [place] [address]    Report Status PENDING  Incomplete  Culture, blood (Routine X 2) w Reflex to ID Panel     Status: None  (Preliminary result)   Collection Time: 05/08/21 10:08 AM   Specimen: BLOOD RIGHT HAND  Result Value Ref Range Status   Specimen Description BLOOD RIGHT HAND  Final   Special Requests   Final    BOTTLES DRAWN AEROBIC AND ANAEROBIC Blood Culture results may not be optimal due to an inadequate volume of blood received in culture bottles   Culture   Final    NO GROWTH 2 DAYS Performed at [facility], [place] [address] [place] [address], [place], [place] [address]    Report Status PENDING  Incomplete    Impression/Plan:  1. Necrotizing pancreatitis with abscess - stable and less drainage.  Has been on daptomycin and meropenem and can transition to oral linezolid and oral levaquin 500 mg dialy for 14 days at discharge with follow up in drain clinic.  I will arrange follow up with the patient after that on 3/21.   2. Pericardial effusion with tamponade - stable now with no further effusion.    3.  Medication monitoring - creat stable.    I will sign off, call with questions.

## 2021-05-11 ENCOUNTER — Other Ambulatory Visit: Payer: Self-pay | Admitting: Surgery

## 2021-05-11 DIAGNOSIS — K632 Fistula of intestine: Secondary | ICD-10-CM | POA: Diagnosis not present

## 2021-05-11 DIAGNOSIS — K8582 Other acute pancreatitis with infected necrosis: Secondary | ICD-10-CM | POA: Diagnosis not present

## 2021-05-11 DIAGNOSIS — A491 Streptococcal infection, unspecified site: Secondary | ICD-10-CM | POA: Insufficient documentation

## 2021-05-11 DIAGNOSIS — K859 Acute pancreatitis without necrosis or infection, unspecified: Secondary | ICD-10-CM

## 2021-05-11 HISTORY — DX: Streptococcal infection, unspecified site: A49.1

## 2021-05-11 NOTE — Discharge Summary (Signed)
Physician Discharge Summary   Patient: Scott Foley MRN: [id number] DOB: [birth year]  Admit date:     04/13/2021  Discharge date: 05/10/2021  Discharge Physician: Hosie Poisson   PCP: [name], MD   Recommendations at discharge:  Please follow up with [facility] clinic as recommended Please follow up with General surgery as scheduled.  Please follow up with cardiology as recommended.  Please follow up with ID as recommended.  Please follow up with cbc and BMP in one week.   Discharge Diagnoses: Principal Problem:   Necrotizing pancreatitis Active Problems:   Coronary artery disease with stable angina pectoris (HCC)   Hyperlipemia   Chronic obstructive pulmonary disease (HCC)   Essential hypertension   History of pulmonary embolism   Sepsis ([place])   AKI (acute kidney injury) ([place])   Hypokalemia   Chronic respiratory failure with hypoxia (HCC)   Normocytic anemia   Hypoglycemia   Protein-calorie malnutrition, severe   Pericardial effusion   Pericardial tamponade     Hospital Course: 56 year old male with prior history of coronary artery disease, hypertension, history of pulmonary embolism on Eliquis, hyperlipidemia, CVA in 2014, COVID-19 infection, sepsis secondary to bacteremia from Klebsiella pneumonia, recurrent pancreatitis presents with worsening abdominal pain.  Of note patient underwent cholecystectomy on 12/21, ERCP on 12/22.  Post ERCP patient apparently developed pancreatitis.  He was managed conservatively and discharged home.  This admission patient had a CT done which showed pancreatic necrosis and fluid collections.  IR consulted and he underwent aspiration of the fluid collection on 04/18/2021.  Repeat CT showed stable findings.  He was also incidentally found to have significant pericardial effusion concerning for tamponade.  Cardiology was consulted and underwent pericardiocentesis on 05/01/2021.  He underwent 1 unit of PRBC transfusion on 05/02/2021. The  pericardial catheter removed on 2/22. Abdominal IR catheter in place with minimal input.  Plan to restart his anticoagulation today and monitor overnight and repeat echo on Monday.  Patient seen today, was febrile overnight  and had persistent tachycardia.    Repeat CT abd and pelvis showed Persistent fluid and gas collection associated with the pancreas. Inflammation surrounding these collections is decreased. One fluid/gas collection adjacent to the head of the pancreas approximates the transverse colon. Concern for communication with the transverse colon which would help explain the gas within the fluid collections ventral to the pancreas.   Pt denies any nausea, vomiting or abdominal pain.     Assessment and Plan:     Necrotizing pancreatitis present on admission Patient was seen by GI and general surgery and have signed off.  IR consulted and patient underwent percutaneous drainage on 2/6.  Cultures growing klebsiella pneumonia and citrobacter freundii.  Repeat CT on 2/16 shows stable findings. Abd drain in place and not much drainage overnight. Will need a repeat CT abdomen and pelvis prior to the removal of the drain vs outpatient CT. Will defer to IR for the repeat imaging.  Pt reports some soreness in the abd at the site of the catheter.  Plan for 14 day course of antibiotics.    Pericardial effusion impending cardiac tamponade CT of the abdomen showed incidental finding of a significant pericardial effusion with concern for tamponade. Patient underwent urgent pericardiocentesis on 2/19 and was found to have a bloody fluid. His anticoagulation has been discontinued. Cardiology recommends holding AC till 2.24. Repeat echocardiogram on 2/20 showed trivial pericardial effusion.  Cardiology plans to repeat echocardiogram on Monday, 05/09/2021. Pericardial drain was removed on 2/22. Pericardial fluid  showing enterococcus Faecium, Faecalis and VRE, pt currently on IV meropenam and IV  daptomycin. ID on board and following.  Plan for repeat echocardiogram today.    Fever: Repeat CT abd and pelvis ordered and reviewed with the patient.  Blood cultures ordered and pending. Negative so far.      Sepsis present on admission Appears to have resolved. Blood cultures have been negative so far.        Chronic respiratory failure with hypoxia present on admission Patient has been weaned off oxygen at this time currently on room air with oxygen sats between 90 to 93%.     History of pulmonary embolic cement DVT in December 2020 Restarted eliquis.       Hypertension Blood pressure parameters  are well controlled.      Hyperlipidemia Statin on hold.       Coronary artery disease Restart the plavix and statin .  Pt denies any chest pain.      Severe protein calorie malnutrition Encourage oral intake.     Acute kidney injury Presented with a creatinine of 2.4 Patient was aggressively hydrated.  Creatinine back to baseline.     Anemia of chronic disease Hemoglobin around 9.  Continue to monitor.   Hyponatremia:  Sodium has improved to 134.     Persistent tachycardia at rest EKG shows sinus tachycardia. Possibly sinus tachy cardia from fever.  Cardiology increased the metoprolol to 50 mg BID. Repeat echocardiogram plan for today showed trivial effusion.        Consultants: cardiology Id IR GEN SURGERY Procedures performed:   Disposition: Home Diet recommendation:  Discharge Diet Orders (From admission, onward)     Start     Ordered   05/10/21 0000  Diet - low sodium heart healthy        05/10/21 1308           Regular diet  DISCHARGE MEDICATION: Allergies as of 05/10/2021       Reactions   Penicillin G Hives        Medication List     STOP taking these medications    ADVIL PM PO   isosorbide mononitrate 60 MG 24 hr tablet Commonly known as: IMDUR   lisinopril 40 MG tablet Commonly known as: ZESTRIL       TAKE  these medications    albuterol 108 (90 Base) MCG/ACT inhaler Commonly known as: VENTOLIN HFA Inhale 2 puffs into the lungs every 6 (six) hours as needed for wheezing or shortness of breath.   apixaban 5 MG Tabs tablet Commonly known as: Eliquis Take 1 tablet (5 mg total) by mouth 2 (two) times daily.   clopidogrel 75 MG tablet Commonly known as: PLAVIX Take 1 tablet (75 mg total) by mouth daily.   feeding supplement Liqd Take 237 mLs by mouth 2 (two) times daily between meals.   Fish Oil 1000 MG Caps Take 1 capsule (1,000 mg total) by mouth 2 (two) times daily.   levofloxacin 750 MG tablet Commonly known as: Levaquin Take 1 tablet (750 mg total) by mouth daily for 14 days.   linezolid 600 MG tablet Commonly known as: Zyvox Take 1 tablet (600 mg total) by mouth 2 (two) times daily for 14 days.   metoprolol tartrate 50 MG tablet Commonly known as: LOPRESSOR Take 1 tablet (50 mg total) by mouth 2 (two) times daily.   multivitamin with minerals Tabs tablet Take 1 tablet by mouth daily.   pantoprazole 40 MG tablet  Commonly known as: PROTONIX Take 1 tablet (40 mg total) by mouth 2 (two) times daily.   Repatha 140 MG/ML Sosy Generic drug: Evolocumab Inject 140 mg into the skin every 14 (fourteen) days.   rosuvastatin 40 MG tablet Commonly known as: Crestor Take 1 tablet (40 mg total) by mouth daily. What changed: when to take this   sodium chloride flush 0.9 % Soln Commonly known as: NS 10 mLs by Intracatheter route as needed.   Trelegy Ellipta 100-62.5-25 MCG/ACT Aepb Generic drug: Fluticasone-Umeclidin-Vilant Inhale 1 puff into the lungs daily.   vitamin B-12 1000 MCG tablet Commonly known as: CYANOCOBALAMIN Take 1 tablet (1,000 mcg total) by mouth daily.               Discharge Care Instructions  (From admission, onward)           Start     Ordered   05/10/21 0000  Discharge wound care:       Comments: Drain care.   05/10/21 1308             Follow-up Information     [name], Elnita Maxwell, MD Follow up.   Specialties: Internal Medicine, Interventional Cardiology, Radiology, Anesthesiology Contact information: [address] [place] [address] [phone]         Dwan Bolt, MD. Go on 05/11/2021.   Specialty: General Surgery Why: follow up 05/11/21 at 9:00 am. Please arrive 30 minutes early to complete check in process and bring photo ID and insurance card if you have them Contact information: [place] [place] [address] [phone]          Health- outpt therapy Follow up.   Why: Rx sent to [facility] ( Rx gave to pt and faxed back to [facility])- pt prefers outpt and not HH Contact information: [address]        Sandi Mariscal, MD Follow up.   Specialties: Interventional Radiology, Radiology Why: please flush drain 1-2x/day; record Output daily; call [phone] if any questions; scheduler will call you with 2 week appt to see Dr Pascal Lux as outpatient Contact information: [address] [place] [address] [phone]                 Discharge Exam: Filed Weights   05/07/21 0400 05/08/21 0501 05/09/21 0641  Weight: 80.7 kg 79.2 kg 79.2 kg   General exam: Appears calm and comfortable  Respiratory system: Clear to auscultation. Respiratory effort normal. Cardiovascular system: S1 & S2 heard, RRR. No JVD, murmurs, rubs, gallops or clicks. No pedal edema. Gastrointestinal system: Abdomen is nondistended, soft and nontender. No organomegaly or masses felt. Normal bowel sounds heard. Central nervous system: Alert and oriented. No focal neurological deficits. Extremities: Symmetric 5 x 5 power. Skin: No rashes, lesions or ulcers Psychiatry: Judgement and insight appear normal. Mood & affect appropriate.    Condition at discharge: fair  The results of significant diagnostics from this  hospitalization (including imaging, microbiology, ancillary and laboratory) are listed below for reference.   Imaging Studies: DG Abd 1 View  Result Date: 04/15/2021 CLINICAL DATA:  Confirmation of post pyloric feeding tube placement EXAM: ABDOMEN - 1 VIEW COMPARISON:  None. FINDINGS: The bowel gas pattern is normal. Feeding tube coursing along the expected route of the duodenum with distal tip in the distal duodenum/proximal jejunum. Small amount of intraluminal Gastrografin in the distal duodenum/proximal jejunum. IMPRESSION: Satisfactory positioning of  the feeding tube in the distal duodenum/proximal jejunum. Electronically Signed   By: Keane Police D.O.   On: 04/15/2021 16:58   CT ABDOMEN PELVIS W CONTRAST  Result Date: 05/08/2021 CLINICAL DATA:  Follow-up necrotizing pancreatitis. RIGHT lower quadrant abdominal pain. Evaluate percutaneous drainage catheter. EXAM: CT ABDOMEN AND PELVIS WITH CONTRAST TECHNIQUE: Multidetector CT imaging of the abdomen and pelvis was performed using the standard protocol following bolus administration of intravenous contrast. RADIATION DOSE REDUCTION: This exam was performed according to the departmental dose-optimization program which includes automated exposure control, adjustment of the mA and/or kV according to patient size and/or use of iterative reconstruction technique. CONTRAST:  65mL OMNIPAQUE IOHEXOL 300 MG/ML  SOLN COMPARISON:  CT 04/28/2021 FINDINGS: Lower chest: Interval reduction in pericardial effusion. Mild effusion remains along the RIGHT heart border measuring only 8 mm in depth. Hepatobiliary: Postcholecystectomy. Pneumobilia within the LEFT hepatic lobe common bile duct similar prior. Pancreas: Again demonstrated walled-off fluid collections adjacent to the pancreas with interspersed gas. The degree of inflammation in the retroperitoneal fat adjacent to collections is decreased. Collection mid body the pancreas measures 4.3 x 3.0 cm compared with 4.8 by  3.2 cm remeasured. This collection now has gas where previously only fluid. A second gas and fluid collection adjacent the head of the pancreas measures 3.8 x 2.6 cm (image 33/series 3) compared to 4.3 x 3.0 cm for no interval change. This collection approaches the transverse colon (image 32/3). Cannot completely exclude communication with this gas and fluid collection with the transverse colon. Approximation between the collection in transverse colon is also seen on sagittal image 56/8. Several walled-off fluid collection inferior to the pancreas in the RIGHT upper quadrant are unchanged (image 43/series 3). In the RIGHT lower quadrant fluid collection in the RIGHT iliac fossa measures 3.2 cm (image 57/3) slightly decreased from 3.6 cm. Pigtail catheter remains within this collection Spleen: Normal spleen Adrenals/urinary tract: Adrenal glands and kidneys are normal. The ureters and bladder normal. Stomach/Bowel: Stomach, duodenum and small bowel normal. Appendix not identified. Ascending colon transverse colon normal. Transverse colon approximates the peripancreatic fluid collection as described above. LEFT colon rectum normal. Vascular/Lymphatic: Abdominal aorta is normal caliber with atherosclerotic calcification. There is no retroperitoneal or periportal lymphadenopathy. No pelvic lymphadenopathy. Reproductive: Unremarkable Other: Nodularity along the retroperitoneum of the LEFT along the perinephric space (is 41/3 is unchanged. Favor inflammatory Musculoskeletal: No aggressive osseous lesion. IMPRESSION: 1. Persistent fluid and gas collection associated with the pancreas. Inflammation surrounding these collections is decreased. Size unchanged 2. One fluid/gas collection adjacent to the head of the pancreas approximates the transverse colon. Concern for communication with the transverse colon which would help explain the gas within the fluid collections ventral to the pancreas. 3. Fluid collections inferior to  the pancreas are unchanged. 4. Fluid collection in the RIGHT iliac fossa with drain in place is unchanged 5. Interval marked reduction in pericardial effusion. 6. Small RIGHT pleural effusion. Electronically Signed   By: Suzy Bouchard M.D.   On: 05/08/2021 14:31   CT ABDOMEN PELVIS W CONTRAST  Addendum Date: 04/28/2021   ADDENDUM REPORT: 04/28/2021 19:43 ADDENDUM: These results were called by telephone at the time of interpretation on 04/28/2021 at 7:39 pm to provider Dr. Olena Heckle, Who verbally acknowledged these results. Electronically Signed   By: Fidela Salisbury M.D.   On: 04/28/2021 19:43   Result Date: 04/28/2021 CLINICAL DATA:  Necrotizing pancreatitis, follow-up examination EXAM: CT ABDOMEN AND PELVIS WITH CONTRAST TECHNIQUE: Multidetector CT imaging of the abdomen  and pelvis was performed using the standard protocol following bolus administration of intravenous contrast. RADIATION DOSE REDUCTION: This exam was performed according to the departmental dose-optimization program which includes automated exposure control, adjustment of the mA and/or kV according to patient size and/or use of iterative reconstruction technique. CONTRAST:  118mL OMNIPAQUE IOHEXOL 300 MG/ML  SOLN COMPARISON:  04/18/2021 FINDINGS: Lower chest: Moderate, complex appearing pericardial effusion demonstrating subtle pericardial enhancement has developed with evidence of cardiac tamponade and relative leftward shift of the interventricular septum when compared to prior examination. Small bilateral pleural effusions are again identified with associated bibasilar compressive atelectasis. Nodular densities within the subpleural right middle lobe are unchanged. Hepatobiliary: No focal liver abnormality is seen. Status post cholecystectomy. No biliary dilatation. Mild pneumobilia within the nondependent left hepatic lobe again noted. Pancreas: Findings in keeping with necrotizing pancreatitis are again identified with heterogeneous  enhancement the pancreatic parenchyma best appreciated within the head and body of the pancreas, stable since prior examination. Acute peripancreatic necrotic collections adjacent to the mid body of the pancreas at axial image # 36/3 measuring 3.9 x 2.9 cm appear better defined though a discrete capsule is not clearly identified to suggest walled off necrosis at this time. However, more loculated gas and complex fluid containing collections adjacent to the head of the pancreas measuring 3.0 x 3.9 cm at axial image # 38/3 and 2.2 x 3.8 cm at axial image # 47/3 now demonstrate more well-defined enhancing margins and are compatible with areas of walled-off necrosis. As noted previously, super infection is not excluded. Smaller peripancreatic acute necrotic collections adjacent to the tail of the pancreas appear relatively stable since prior examination. Inflammatory changes are again identified tracking into the pericolic gutters bilaterally. Spleen: Unremarkable.  The splenic vein is patent. Adrenals/Urinary Tract: Adrenal glands are unremarkable. Kidneys are normal, without renal calculi, focal lesion, or hydronephrosis. Bladder is unremarkable. Stomach/Bowel: Nasoenteric feeding tube tip seen within the proximal jejunum beyond the mid lumen of Treitz. Mild ascending colonic diverticulosis. The stomach, small bowel, and large bowel are otherwise unremarkable there is no evidence of obstruction or focal inflammation. Appendix normal. No free intraperitoneal gas or fluid. Vascular/Lymphatic: Previously noted narrowing of the portosplenic confluence, best appreciated on axial image # 36/3, appears improved since prior examination. The splenic vein, superior mesenteric vein, and portal vein appear patent. Extensive aortoiliac atherosclerotic calcification. Focal excrescence of the infrarenal abdominal aorta measuring 3.4 cm in transaxial dimension at axial image # 41/3, is stable possibly representing a a thrombosed  penetrating atherosclerotic ulcer or focal dissection. This is unchanged since remote prior examination of 03/03/2019. No pathologic adenopathy within the abdomen and pelvis. Reproductive: Prostate is unremarkable. Other: The previously noted area of walled-off necrosis within the right retroperitoneum within the iliac fossa has undergone percutaneous drainage with a cope loop catheter well position within the residual collection. The collection has decreased in size, now measuring 2.9 x 3.6 cm at axial image # 61/3. Tiny right fat containing inguinal hernia. Mild subcutaneous edema noted within the flanks bilaterally. Musculoskeletal: No acute bone abnormality. No lytic or blastic bone lesions are identified. Stable remote L1 compression fracture IMPRESSION: Interval development of a moderate pericardial effusion demonstrating complex features with pericardial enhancement and evidence of cardiac tamponade with mild relative leftward shift of the intraventricular septum when compared to prior examination of 04/18/2021. Correlation with echocardiography is recommended. Stable changes of anasarca with small bilateral pleural effusions and mild subcutaneous edema within the flanks bilaterally. Evolving changes of necrotizing pancreatitis.  Stable enhancement of the residual pancreatic parenchyma. Increasing organization peripancreatic necrosis adjacent to the head and body of the pancreas with areas of more formal walled-off necrosis adjacent to the pancreatic head demonstrating slight interval decrease in size since prior examination. Intraluminal gas within areas of walled-off necrosis are again identified and superinfection is not excluded. Interval percutaneous drainage of area retroperitoneal walled-off necrosis within the right iliac fossa with interval decrease in size of loculated collection. Improved mass effect upon the portosplenic confluence with wide patency of the superior mesenteric vein, splenic vein,  and portal vein. Attempts are being made at this time to contact the patient is managing clinician for direct verbal communication of these findings. Electronically Signed: By: Fidela Salisbury M.D. On: 04/28/2021 19:25   CT ABDOMEN PELVIS W CONTRAST  Result Date: 04/18/2021 CLINICAL DATA:  History of necrotic pancreatitis in a 56 year old male, concern for abscess. EXAM: CT ABDOMEN AND PELVIS WITH CONTRAST TECHNIQUE: Multidetector CT imaging of the abdomen and pelvis was performed using the standard protocol following bolus administration of intravenous contrast. RADIATION DOSE REDUCTION: This exam was performed according to the departmental dose-optimization program which includes automated exposure control, adjustment of the mA and/or kV according to patient size and/or use of iterative reconstruction technique. CONTRAST:  69mL OMNIPAQUE IOHEXOL 300 MG/ML  SOLN COMPARISON:  April 12, 2021. FINDINGS: Lower chest: Basilar volume loss and bilateral pleural effusions have developed since the prior study. More consolidative appearance in the LEFT lower lobe. Effusions are present, small to moderate on the LEFT and small on the RIGHT. w subtle patchy airspace opacities are noted with similar appearance within aerated portions of the lung compared to the most recent comparison study. Hepatobiliary: No focal, suspicious hepatic lesion. Suspect background hepatic steatosis. Post cholecystectomy. No substantial biliary duct dilation. High-grade narrowing of the portal vein just below the level of the portal-splenic confluence is similar to the prior exam. SMV beyond this level does appear patent but there is substantial surrounding inflammatory change in the setting of sequela of pancreatitis with walled-off necrosis involving the gland and the peripancreatic fat. Some adjacent stranding could reflect superimposed acute interstitial edematous changes as well. There is a band of hypoenhancement that travels through the  pancreatic head indicative of glandular necrosis (image 37/3) Pancreas: Findings outlined partially above with respect to glandular and peripancreatic necrosis and with walled off necrosis showing peripheral enhancement and gas present throughout the pancreatic bed adjacent to the body, neck and head of the pancreas and tracking into the RIGHT retroperitoneum and root of the small bowel mesentery. Collection tracking into the transverse mesocolon (image 35/3) 5.6 x 5.8 cm, within 1-2 mm when measured in a similar fashion. Additional communicating area in the anterior lesser sac (image 30/3) 5.0 x 3.0 cm previously 6.1 x 3.1 cm. Fluid tracking anterior to the pancreas and inferior to the pancreas about the body-tail junction is also similar and also containing gas measuring up to 2.6 cm greatest thickness. Walled-off necrosis of fat that extends throughout the anterior pararenal space and tracks into the RIGHT lower quadrant of the pelvis where there is a discrete collection with peripheral enhancement measuring 5.1 x 4.7 cm previously 6.6 x 5.3 cm. This is inseparable from the RIGHT iliacus muscle. Spleen: Splenic vein is highly narrowed. Spleen enhances normally aside from very subtle low attenuation at the periphery which is unchanged potentially a tiny splenic infarct (image 25/3) up to 9 mm. Adrenals/Urinary Tract: Adrenal glands are unremarkable. Symmetric renal enhancement. No sign  of hydronephrosis. No suspicious renal lesion or perinephric stranding. Urinary bladder is grossly unremarkable. Stomach/Bowel: Enteric tube in place terminating just beyond the ligament of Treitz. No signs of small bowel obstruction. Inflammation extends along the LEFT flank as well about the anterior renal fascia and lateral conal fascia with a nodular appearance that is not changed. These inflammatory changes do not cause colonic narrowing. The appendix is normal. Vascular/Lymphatic: Chronic appearing focal dilation of the  infrarenal abdominal aorta measuring 3.3 cm with associated mural thrombus is unchanged measuring approximately 3.3 cm greatest axial dimension. Stable since December of 2020. There is no gastrohepatic or hepatoduodenal ligament lymphadenopathy. No retroperitoneal or mesenteric lymphadenopathy. No pelvic sidewall lymphadenopathy. Reproductive: Unremarkable by CT. Other: No ascites. Signs of body wall edema which have developed since the previous study. This is along the RIGHT and LEFT flank. Musculoskeletal: Chronic T12 loss of height approximately 40% and associated degenerative changes throughout the spine without change. IMPRESSION: 1. Signs of glandular and peripancreatic necrosis with walled-off necrosis involving the gland and the peripancreatic fat. Inflammatory changes track into the retroperitoneum and lesser sac as well as transverse mesocolon and show mild improvement in some areas as discussed above, still with luminal gas and or peripheral enhancement not allowing for exclusion of concomitant infection. 2. 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 3. Extensive nodular inflammatory changes in the LEFT retroperitoneum without focal collection 4. High-grade narrowing of the SMV/portal vein greatest below the portal splenic confluence is similar to the prior exam, attention on follow-up. Main portal vein remains patent into the liver. Peripheral branches of the SMV also appear patent. 5. Concomitant high-grade narrowing of the splenic vein 6. Focal rightward infrarenal abdominal aortic dilation with associated mural thrombus/extensive soft plaque extends 12 mm beyond the expected lumen and shows little change with a maximal caliber of 3.3 cm since 2020. Based on the focal appearance penetrating atherosclerotic ulcer or chronic pseudoaneurysm are considered. CT angiography at 1 year interval may be helpful given the focal nature  of this finding. Vascular consultation could also be beneficial on follow-up. Aortic Atherosclerosis (ICD10-I70.0). Electronically Signed   By: Zetta Bills M.D.   On: 04/18/2021 09:36   CARDIAC CATHETERIZATION  Result Date: 05/01/2021 Successful pericardiocentesis from an intercostal approach with removal of 600 cc of hemorrhagic fluid. Plan: will leave drain in place for now. Check portable CXR. Fluid sent for analysis. Will hold IV heparin.   DG CHEST PORT 1 VIEW  Result Date: 05/01/2021 CLINICAL DATA:  Pericardial effusion EXAM: PORTABLE CHEST 1 VIEW COMPARISON:  05/01/2021, 9:51 a.m. FINDINGS: Unchanged cardiomegaly. Diffuse bilateral interstitial pulmonary opacity. Small, layering bilateral pleural effusions. The visualized skeletal structures are unremarkable. IMPRESSION: 1. Unchanged AP portable examination. Cardiomegaly in keeping with known pericardial effusion. 2. Diffuse bilateral interstitial pulmonary opacity and small, layering bilateral pleural effusions, consistent with edema. No new airspace opacity. Electronically Signed   By: Delanna Ahmadi M.D.   On: 05/01/2021 17:00   DG CHEST PORT 1 VIEW  Result Date: 05/01/2021 CLINICAL DATA:  Dyspnea R06.00 (ICD-10-CM) EXAM: PORTABLE CHEST 1 VIEW COMPARISON:  February 1, 23. FINDINGS: Low lung volumes. New retrocardiac opacity.Otherwise, similar patchy mild opacities in bilateral mid basilar lungs. Question nodular opacity in the right midlung. No definite pleural effusions or pneumothorax. Cardiomediastinal silhouette is mildly enlarged, likely accentuated by low lung volumes and AP technique. IMPRESSION: 1. Low lung volumes with new retrocardiac opacity, which could represent atelectasis, aspiration,  and/or pneumonia. Otherwise, similar patchy mild opacities in bilateral mid basilar lungs, suspicious for pneumonia. 2. Question nodular opacity in the right midlung. Recommend CT chest to further evaluate for pulmonary nodule/malignancy.  Electronically Signed   By: Margaretha Sheffield M.D.   On: 05/01/2021 11:12   DG CHEST PORT 1 VIEW  Result Date: 04/13/2021 CLINICAL DATA:  Sepsis. EXAM: PORTABLE CHEST 1 VIEW COMPARISON:  Chest x-ray 03/27/2021. FINDINGS: The cardiomediastinal silhouette appears stable. There are patchy bilateral multifocal airspace opacities in the mid and lower lungs, mildly increased. There is no pleural effusion or pneumothorax. No acute fractures are seen. IMPRESSION: 1. Bilateral multifocal patchy airspace disease has increased from prior examination. Findings are worrisome for infection. Electronically Signed   By: Ronney Asters M.D.   On: 04/13/2021 19:17   CT IMAGE GUIDED DRAINAGE BY PERCUTANEOUS CATHETER  Result Date: 04/18/2021 INDICATION: History of pancreatitis and pseudocyst formation following ERCP with persistent elevated white blood cell count. Patient has been evaluated by the hepatobiliary service (Dr. Zenia Resides), and request has been made for placement of a percutaneous drainage catheter for infection source control purposes. EXAM: CT IMAGE GUIDED DRAINAGE BY PERCUTANEOUS CATHETER COMPARISON:  CT abdomen pelvis-04/18/2021; 04/13/2021 MEDICATIONS: The patient is currently admitted to the hospital and receiving intravenous antibiotics. The antibiotics were administered within an appropriate time frame prior to the initiation of the procedure. ANESTHESIA/SEDATION: Moderate (conscious) sedation was employed during this procedure as administered by the Interventional Radiology RN. A total of Versed 2 mg and Fentanyl 100 mcg was administered intravenously. Moderate Sedation Time: 18 minutes. The patient's level of consciousness and vital signs were monitored continuously by radiology nursing throughout the procedure under my direct supervision. CONTRAST:  None COMPLICATIONS: None immediate. PROCEDURE: RADIATION DOSE REDUCTION: This exam was performed according to the departmental dose-optimization program which  includes automated exposure control, adjustment of the mA and/or kV according to patient size and/or use of iterative reconstruction technique. Informed written consent was obtained from the patient after a discussion of the risks, benefits and alternatives to treatment. The patient was placed supine on the CT gantry and a pre procedural CT was performed re-demonstrating the known abscess/fluid collection within the pancreatic bed, extending along right iliopsoas musculature to the right lower abdomen with dominant component within the right lower abdomen measuring approximately 5.6 x 5.0 cm (image 49, series 2). The procedure was planned. A timeout was performed prior to the initiation of the procedure. The skin overlying the inferolateral aspect of the right mid abdomen was prepped and draped in the usual sterile fashion. The overlying soft tissues were anesthetized with 1% lidocaine with epinephrine. Appropriate trajectory was planned with the use of a 22 gauge spinal needle. An 18 gauge trocar needle was advanced into the abscess/fluid collection and a short Amplatz super stiff wire was coiled within the collection. Appropriate positioning was confirmed with a limited CT scan. The tract was serially dilated allowing placement of a 12 [place] all-purpose drainage catheter. Appropriate positioning was confirmed with a limited postprocedural CT scan. Approximately 75 ml of thick, dark tan colored fluid was aspirated. The tube was connected to a drainage bag and sutured in place. A dressing was placed. The patient tolerated the procedure well without immediate post procedural complication. IMPRESSION: Successful CT guided placement of a 81 French all purpose drain catheter into the right lower abdominal quadrant pseudocyst with aspiration of 75 mL of thick, dark tan colored fluid. Samples were sent to the laboratory as requested by the ordering clinical  team. Electronically Signed   By: Sandi Mariscal M.D.   On:  04/18/2021 10:51   ECHOCARDIOGRAM LIMITED  Result Date: 05/09/2021    ECHOCARDIOGRAM LIMITED REPORT   Patient Name:   NILE PRISK Date of Exam: 05/09/2021 Medical Rec #:  [id number]             Height:       64.0 in Accession #:    [id number]            Weight:       174.6 lb Date of Birth:  14-Apr-1965             BSA:          1.847 m Patient Age:    56 years              BP:           151/89 mmHg Patient Gender: M                     HR:           88 bpm. Exam Location:  Inpatient Procedure: 2D Echo, Limited Echo and Limited Color Doppler Indications:    Pericardial effusions  History:        Patient has prior history of Echocardiogram examinations. CHF,                 COPD, Signs/Symptoms:Chest Pain; Risk Factors:Hypertension and                 Dyslipidemia. 05/01/2021 Pericardiocentensis.  Sonographer:    Clayton Lefort RDCS (AE) Referring Phys: [id number] [facility] G BARRETT  Sonographer Comments: Multiple bandages in PLAX and subcostal windows. IMPRESSIONS  1. Left ventricular ejection fraction, by estimation, is 55 to 60%. The left ventricle has normal function.  2. Right ventricular systolic function is normal.  3. There is a trivial pericardial effusion mainly around the RV free wall consistent with prior limited TTE on 05/02/21.  4. The mitral valve is grossly normal.  5. The aortic valve is tricuspid. There is mild thickening of the aortic valve. Aortic valve regurgitation is not visualized. Aortic valve sclerosis is present, with no evidence of aortic valve stenosis. Comparison(s): Compared to prior limited TTE on 05/02/21, there is no significant change. The pericardial effusion remains trivial. FINDINGS  Left Ventricle: Left ventricular ejection fraction, by estimation, is 55 to 60%. The left ventricle has normal function. Right Ventricle: Right ventricular systolic function is normal. Pericardium: There is a trivial pericardial effusion mainly around the RV free wall consistent with prior limited TTE  on 05/02/21. Mitral Valve: The mitral valve is grossly normal. Tricuspid Valve: The tricuspid valve is normal in structure. Tricuspid valve regurgitation is trivial. Aortic Valve: The aortic valve is tricuspid. There is mild thickening of the aortic valve. Aortic valve regurgitation is not visualized. Aortic valve sclerosis is present, with no evidence of aortic valve stenosis. Pulmonic Valve: The pulmonic valve was normal in structure. Pulmonic valve regurgitation is trivial. IVC IVC diam: 1.60 cm Gwyndolyn Kaufman MD Electronically signed by Gwyndolyn Kaufman MD Signature Date/Time: 05/09/2021/2:58:01 PM    Final    ECHOCARDIOGRAM LIMITED  Result Date: 05/02/2021    ECHOCARDIOGRAM LIMITED REPORT   Patient Name:   LEONID MANUS Date of Exam: 05/02/2021 Medical Rec #:  [id number]             Height:       64.0 in Accession #:  [id number]            Weight:       193.1 lb Date of Birth:  1965/11/03             BSA:          1.927 m Patient Age:    55 years              BP:           124/81 mmHg Patient Gender: M                     HR:           99 bpm. Exam Location:  Inpatient Procedure: Limited Echo, Cardiac Doppler and Color Doppler Indications:    Follow up pericardial effusion  History:        Patient has prior history of Echocardiogram examinations, most                 recent 05/01/2021. CHF, CAD, COPD, Signs/Symptoms:Chest Pain;                 Risk Factors:Hypertension and Dyslipidemia. 05/01/2021                 Pericardiocentensis.  Sonographer:    Luisa Hart RDCS Referring Phys: [id number] PETER M [place] IMPRESSIONS  1. Left ventricular ejection fraction, by estimation, is 55 to 60%. The left ventricle has normal function.  2. The moderate pericardial effusion has been tapped . The remaining pericardial effusion is now trivial.  3. The mitral valve is grossly normal. Trivial mitral valve regurgitation. FINDINGS  Left Ventricle: Left ventricular ejection fraction, by estimation, is 55 to 60%. The  left ventricle has normal function. Pericardium: The moderate pericardial effusion has been tapped . The remaining pericardial effusion is now trivial. Trivial pericardial effusion is present. Mitral Valve: The mitral valve is grossly normal. Trivial mitral valve regurgitation. Aortic Valve: Aortic valve mean gradient measures 5.0 mmHg. Aortic valve peak gradient measures 9.1 mmHg.  Diastology LV e' medial:  8.92 cm/s LV e' lateral: 9.25 cm/s  RIGHT VENTRICLE RV Basal diam:  3.00 cm RV Mid diam:    2.60 cm RV S prime:     13.30 cm/s TAPSE (M-mode): 1.2 cm LEFT ATRIUM           Index        RIGHT ATRIUM           Index LA Vol (A4C): 48.2 ml 25.01 ml/m  RA Area:     10.70 cm                                    RA Volume:   21.40 ml  11.10 ml/m  AORTIC VALVE AV Vmax:           151.00 cm/s AV Vmean:          103.000 cm/s AV VTI:            0.234 m AV Peak Grad:      9.1 mmHg AV Mean Grad:      5.0 mmHg LVOT Vmax:         127.00 cm/s LVOT Vmean:        81.700 cm/s LVOT VTI:          0.203 m LVOT/AV VTI ratio: 0.87 TRICUSPID VALVE TR Peak grad:   37.9 mmHg TR  Vmax:        308.00 cm/s  SHUNTS Systemic VTI: 0.20 m Mertie Moores MD Electronically signed by Mertie Moores MD Signature Date/Time: 05/02/2021/1:47:55 PM    Final    ECHOCARDIOGRAM LIMITED  Result Date: 05/01/2021    ECHOCARDIOGRAM LIMITED REPORT   Patient Name:   ARIEON CORCORAN Date of Exam: 05/01/2021 Medical Rec #:  [id number]             Height:       64.0 in Accession #:    [id number]            Weight:       207.2 lb Date of Birth:  10/06/1965             BSA:          1.986 m Patient Age:    30 years              BP:           116/75 mmHg Patient Gender: M                     HR:           110 bpm. Exam Location:  Inpatient Procedure: Limited Echo Indications:    Pericardial effusion and pericardiocentesis.  History:        Patient has prior history of Echocardiogram examinations, most                 recent 04/30/2021. CAD, COPD; Risk  Factors:Hypertension and                 Dyslipidemia.  Sonographer:    Clayton Lefort RDCS (AE) Referring Phys: [id number] PETER M [place] IMPRESSIONS  1. Left ventricular ejection fraction, by estimation, is 55 to 60%. The left ventricle has normal function. The left ventricle demonstrates regional wall motion abnormalities abnormal septal motion.  2. Right ventricular systolic function is normal. The right ventricular size is normal.  3. Moderate pericardial effusion. The pericardial effusion is lateral to the left ventricle.  4. The mitral valve is normal in structure. No evidence of mitral valve regurgitation.  5. Compared to prior effusion size has improved. While there is no RV or RA collapse, there is a persistent pericardial effusion, localized lateral to the LV. Patient has tachycardia. The IVC was not assessed. Comparison(s): Persistent pericardial effusion. FINDINGS  Left Ventricle: Left ventricular ejection fraction, by estimation, is 55 to 60%. The left ventricle has normal function. The left ventricle demonstrates regional wall motion abnormalities. Right Ventricle: The right ventricular size is normal. Right ventricular systolic function is normal. Pericardium: A moderately sized pericardial effusion is present. The pericardial effusion is lateral to the left ventricle. Mitral Valve: The mitral valve is normal in structure. Tricuspid Valve: The tricuspid valve is normal in structure. Rudean Haskell MD Electronically signed by Rudean Haskell MD Signature Date/Time: 05/01/2021/1:51:12 PM    Final    ECHOCARDIOGRAM LIMITED  Result Date: 04/30/2021    ECHOCARDIOGRAM LIMITED REPORT   Patient Name:   NATALE THOMA Date of Exam: 04/30/2021 Medical Rec #:  [id number]             Height:       64.0 in Accession #:    [id number]            Weight:       207.2 lb Date of Birth:  [birth year]  BSA:          1.986 m Patient Age:    75 years              BP:           101/72 mmHg Patient Gender:  M                     HR:           106 bpm. Exam Location:  Inpatient Procedure: Limited Echo, Cardiac Doppler and Color Doppler Indications:    Pericardial effusion  History:        Patient has prior history of Echocardiogram examinations, most                 recent 04/28/2021. CAD, COPD; Risk Factors:Hypertension and                 Dyslipidemia.  Sonographer:    Clayton Lefort RDCS (AE) Referring Phys: [place]  1. Respiratory variation is 34% across the MV. The RA is not well visualized. There is evidence of early RV diastolic collapse. Similar findings of tamponade physiology to 04/28/2021 echo. Chart reviewed, hemodynamics stable since last echo. Appears plan  has been close inpatient monitoring, potential pericardiocentesis if becomes clinically indicated. . Large pericardial effusion. The pericardial effusion is circumferential.  2. The inferior vena cava is dilated in size with <50% respiratory variability, suggesting right atrial pressure of 15 mmHg.  3. Limited echo to evaluate pericardial effusion FINDINGS  Right Atrium: Right atrial size was not well visualized. Pericardium: Respiratory variation is 34% across the MV. The RA is not well visualized. There is evidence of early RV diastolic collapse. Similar findings of tamponade physiology to 04/28/2021 echo. Chart reviewed, hemodynamics stable since last echo. Appears plan has been close inpatient monitoring, potential pericardiocentesis if becomes clinically indicated. A large pericardial effusion is present. The pericardial effusion is circumferential. Venous: The inferior vena cava is dilated in size with less than 50% respiratory variability, suggesting right atrial pressure of 15 mmHg. IVC IVC diam: 2.20 cm Carlyle Dolly MD Electronically signed by Carlyle Dolly MD Signature Date/Time: 04/30/2021/6:05:11 PM    Final    ECHOCARDIOGRAM LIMITED  Result Date: 04/29/2021    ECHOCARDIOGRAM REPORT   Patient Name:   DACEN FRAYRE Date of Exam: 04/28/2021 Medical Rec #:  [id number]             Height:       64.0 in Accession #:    [id number]            Weight:       207.2 lb Date of Birth:  11-Jun-1965             BSA:          1.986 m Patient Age:    21 years              BP:           122/89 mmHg Patient Gender: M                     HR:           109 bpm. Exam Location:  Inpatient Procedure: Limited Echo, Cardiac Doppler and Limited Color Doppler                     STAT ECHO Reported to: Dr. Hassell Done on 04/28/2021 10:20:00 PM. Indications:  cardiac tamponade  History:        Patient has prior history of Echocardiogram examinations, most                 recent 02/09/2021. CAD, COPD; Risk Factors:Dyslipidemia.  Sonographer:    Johny Chess RDCS Referring Phys: [place]  1. Left ventricular ejection fraction, by estimation, is 60 to 65%. The left ventricle has normal function. The left ventricle has no regional wall motion abnormalities. Indeterminate diastolic filling due to E-A fusion.  2. Right ventricular systolic function is normal. The right ventricular size is normal.  3. Large circumferential pericardial effusion with evidence of tamponade . Large pericardial effusion. The pericardial effusion is circumferential.  4. The mitral valve is normal in structure. No evidence of mitral valve regurgitation. No evidence of mitral stenosis.  5. The aortic valve is tricuspid. Aortic valve regurgitation is not visualized. No aortic stenosis is present.  6. The inferior vena cava is normal in size with greater than 50% respiratory variability, suggesting right atrial pressure of 3 mmHg. FINDINGS  Left Ventricle: Left ventricular ejection fraction, by estimation, is 60 to 65%. The left ventricle has normal function. The left ventricle has no regional wall motion abnormalities. The left ventricular internal cavity size was normal in size. There is  no left ventricular hypertrophy. Indeterminate diastolic filling due  to E-A fusion. Right Ventricle: The right ventricular size is normal. No increase in right ventricular wall thickness. Right ventricular systolic function is normal. Left Atrium: Left atrial size was normal in size. Right Atrium: Right atrial size was normal in size. Pericardium: Large circumferential pericardial effusion with evidence of tamponade. A large pericardial effusion is present. The pericardial effusion is circumferential. There is excessive respiratory variation in the mitral valve spectral Doppler velocities, diastolic collapse of the right atrial wall and diastolic collapse of the right ventricular free wall. Mitral Valve: The mitral valve is normal in structure. No evidence of mitral valve regurgitation. No evidence of mitral valve stenosis. Tricuspid Valve: The tricuspid valve is normal in structure. Tricuspid valve regurgitation is not demonstrated. No evidence of tricuspid stenosis. Aortic Valve: The aortic valve is tricuspid. Aortic valve regurgitation is not visualized. No aortic stenosis is present. Pulmonic Valve: The pulmonic valve was normal in structure. Pulmonic valve regurgitation is not visualized. No evidence of pulmonic stenosis. Aorta: The aortic root is normal in size and structure. Venous: The inferior vena cava is normal in size with greater than 50% respiratory variability, suggesting right atrial pressure of 3 mmHg. IAS/Shunts: No atrial level shunt detected by color flow Doppler.  LEFT VENTRICLE PLAX 2D LVIDd:         4.40 cm LVIDs:         2.90 cm LV PW:         0.90 cm LV IVS:        0.80 cm  LEFT ATRIUM         Index LA diam:    2.80 cm 1.41 cm/m   AORTA Ao Asc diam: 2.60 cm Jenkins Rouge MD Electronically signed by Jenkins Rouge MD Signature Date/Time: 04/29/2021/7:48:41 AM    Final     Microbiology: Results for orders placed or performed during the hospital encounter of 04/13/21  Resp Panel by RT-PCR (Flu A&B, Covid) Nasopharyngeal Swab     Status: None   Collection Time:  04/13/21  3:22 PM   Specimen: Nasopharyngeal Swab; Nasopharyngeal(NP) swabs in vial transport medium  Result Value Ref Range  Status   SARS Coronavirus 2 by RT PCR NEGATIVE NEGATIVE Final    Comment: (NOTE) SARS-CoV-2 target nucleic acids are NOT DETECTED.  The SARS-CoV-2 RNA is generally detectable in upper respiratory specimens during the acute phase of infection. The lowest concentration of SARS-CoV-2 viral copies this assay can detect is 138 copies/mL. A negative result does not preclude SARS-Cov-2 infection and should not be used as the sole basis for treatment or other patient management decisions. A negative result may occur with  improper specimen collection/handling, submission of specimen other than nasopharyngeal swab, presence of viral mutation(s) within the areas targeted by this assay, and inadequate number of viral copies(<138 copies/mL). A negative result must be combined with clinical observations, patient history, and epidemiological information. The expected result is Negative.  Fact Sheet for Patients:  EntrepreneurPulse.com.au  Fact Sheet for Healthcare Providers:  IncredibleEmployment.be  This test is no t yet approved or cleared by the [place] FDA and  has been authorized for detection and/or diagnosis of SARS-CoV-2 by FDA under an Emergency Use Authorization (EUA). This EUA will remain  in effect (meaning this test can be used) for the duration of the COVID-19 declaration under Section 564(b)(1) of the Act, 21 U.S.C.section 360bbb-3(b)(1), unless the authorization is terminated  or revoked sooner.       Influenza A by PCR NEGATIVE NEGATIVE Final   Influenza B by PCR NEGATIVE NEGATIVE Final    Comment: (NOTE) The Xpert Xpress SARS-CoV-2/FLU/RSV plus assay is intended as an aid in the diagnosis of influenza from Nasopharyngeal swab specimens and should not be used as a sole basis for treatment. Nasal washings  and aspirates are unacceptable for Xpert Xpress SARS-CoV-2/FLU/RSV testing.  Fact Sheet for Patients: EntrepreneurPulse.com.au  Fact Sheet for Healthcare Providers: IncredibleEmployment.be  This test is not yet approved or cleared by the [place] FDA and has been authorized for detection and/or diagnosis of SARS-CoV-2 by FDA under an Emergency Use Authorization (EUA). This EUA will remain in effect (meaning this test can be used) for the duration of the COVID-19 declaration under Section 564(b)(1) of the Act, 21 U.S.C. section 360bbb-3(b)(1), unless the authorization is terminated or revoked.  Performed at [facility], [place] [address], [place], [place] [address]   Blood Culture (routine x 2)     Status: None   Collection Time: 04/13/21  3:24 PM   Specimen: BLOOD  Result Value Ref Range Status   Specimen Description BLOOD SITE NOT SPECIFIED  Final   Special Requests   Final    BOTTLES DRAWN AEROBIC AND ANAEROBIC Blood Culture adequate volume   Culture   Final    NO GROWTH 5 DAYS Performed at [facility], [address], [place], [place] [address]    Report Status 04/18/2021 FINAL  Final  Blood Culture (routine x 2)     Status: None   Collection Time: 04/13/21  5:55 PM   Specimen: BLOOD  Result Value Ref Range Status   Specimen Description BLOOD LEFT ANTECUBITAL  Final   Special Requests   Final    BOTTLES DRAWN AEROBIC AND ANAEROBIC Blood Culture results may not be optimal due to an excessive volume of blood received in culture bottles   Culture   Final    NO GROWTH 5 DAYS Performed at [facility], [place] [address] [place],  [address]    Report Status 04/18/2021 FINAL  Final  Urine Culture     Status: None   Collection Time:  04/13/21  6:33 PM   Specimen: Urine, Clean Catch  Result Value Ref Range Status   Specimen Description URINE, CLEAN CATCH  Final   Special Requests NONE  Final   Culture   Final     NO GROWTH Performed at [facility], [address], [place], [place] [address]    Report Status 04/15/2021 FINAL  Final  Aerobic/Anaerobic Culture w Gram Stain (surgical/deep wound)     Status: None   Collection Time: 04/18/21 10:01 AM   Specimen: Abscess  Result Value Ref Range Status   Specimen Description ABSCESS  Final   Special Requests DRAIN  Final   Gram Stain   Final    MODERATE WBC PRESENT, PREDOMINANTLY MONONUCLEAR NO ORGANISMS SEEN    Culture   Final    ABUNDANT KLEBSIELLA PNEUMONIAE ABUNDANT CITROBACTER FREUNDII NO ANAEROBES ISOLATED Performed at [facility], [place] [address], [place], [place] [address]    Report Status 04/23/2021 FINAL  Final   Organism ID, Bacteria KLEBSIELLA PNEUMONIAE  Final   Organism ID, Bacteria CITROBACTER FREUNDII  Final      Susceptibility   Citrobacter freundii - MIC*    CEFAZOLIN >=64 RESISTANT Resistant     CEFEPIME <=0.12 SENSITIVE Sensitive     CEFTAZIDIME <=1 SENSITIVE Sensitive     CEFTRIAXONE <=0.25 SENSITIVE Sensitive     CIPROFLOXACIN <=0.25 SENSITIVE Sensitive     GENTAMICIN <=1 SENSITIVE Sensitive     IMIPENEM <=0.25 SENSITIVE Sensitive     TRIMETH/SULFA <=20 SENSITIVE Sensitive     PIP/TAZO <=4 SENSITIVE Sensitive     * ABUNDANT CITROBACTER FREUNDII   Klebsiella pneumoniae - MIC*    AMPICILLIN >=32 RESISTANT Resistant     CEFAZOLIN <=4 SENSITIVE Sensitive     CEFEPIME <=0.12 SENSITIVE Sensitive     CEFTAZIDIME <=1 SENSITIVE Sensitive     CEFTRIAXONE <=0.25 SENSITIVE Sensitive     CIPROFLOXACIN <=0.25 SENSITIVE Sensitive     GENTAMICIN <=1 SENSITIVE Sensitive     IMIPENEM <=0.25 SENSITIVE Sensitive     TRIMETH/SULFA <=20 SENSITIVE Sensitive     AMPICILLIN/SULBACTAM 4 SENSITIVE Sensitive     PIP/TAZO <=4 SENSITIVE Sensitive     * ABUNDANT KLEBSIELLA PNEUMONIAE  Surgical PCR screen     Status: None   Collection Time: 05/01/21 11:34 AM   Specimen: Nasal Mucosa; Nasal Swab  Result Value Ref Range Status    MRSA, PCR NEGATIVE NEGATIVE Final   Staphylococcus aureus NEGATIVE NEGATIVE Final    Comment: (NOTE) The Xpert SA Assay (FDA approved for NASAL specimens in patients 52 years of age and older), is one component of a comprehensive surveillance program. It is not intended to diagnose infection nor to guide or monitor treatment. Performed at [facility], [place] [address] [place], [place] [address]   Culture, body fluid w Gram Stain-bottle     Status: Abnormal   Collection Time: 05/01/21 12:42 PM   Specimen: Pericardial  Result Value Ref Range Status   Specimen Description PERICARDIAL FLUID  Final   Special Requests SPEC A  Final   Gram Stain   Final    GRAM POSITIVE COCCI IN BOTH AEROBIC AND ANAEROBIC BOTTLES CRITICAL RESULT CALLED TO, READ BACK BY AND VERIFIED WITH: T ARMSTRONG,RN @0300  05/02/21 [place] Performed at [facility], [place] [address], [place], [place] [address]    Culture (A)  Final    ENTEROCOCCUS FAECIUM VANCOMYCIN RESISTANT ENTEROCOCCUS ENTEROCOCCUS FAECALIS    Report Status 05/06/2021 FINAL  Final   Organism ID, Bacteria ENTEROCOCCUS FAECIUM  Final   Organism ID, Bacteria ENTEROCOCCUS FAECALIS  Final      Susceptibility   Enterococcus faecalis - MIC*    AMPICILLIN <=2 SENSITIVE Sensitive     VANCOMYCIN 1 SENSITIVE Sensitive     GENTAMICIN SYNERGY SENSITIVE Sensitive     * ENTEROCOCCUS FAECALIS   Enterococcus faecium - MIC*    AMPICILLIN >=32 RESISTANT Resistant     VANCOMYCIN >=32 RESISTANT Resistant     GENTAMICIN SYNERGY SENSITIVE Sensitive     LINEZOLID 2 SENSITIVE Sensitive     * ENTEROCOCCUS FAECIUM  Gram stain     Status: None   Collection Time: 05/01/21 12:42 PM   Specimen: Pericardial  Result Value Ref Range Status   Specimen Description PERICARDIAL FLUID  Final   Special Requests SPEC A  Final   Gram Stain   Final    FEW WBC PRESENT,BOTH PMN AND MONONUCLEAR NO ORGANISMS SEEN Performed at [facility], [place] [address],  [place], [place] [address]    Report Status 05/01/2021 FINAL  Final  Culture, blood (Routine X 2) w Reflex to ID Panel     Status: None (Preliminary result)   Collection Time: 05/08/21  9:55 AM   Specimen: BLOOD LEFT HAND  Result Value Ref Range Status   Specimen Description BLOOD LEFT HAND  Final   Special Requests   Final    BOTTLES DRAWN AEROBIC ONLY Blood Culture results may not be optimal due to an inadequate volume of blood received in culture bottles   Culture   Final    NO GROWTH 2 DAYS Performed at [facility], [place] [address] [place], [place] [address]    Report Status PENDING  Incomplete  Culture, blood (Routine X 2) w Reflex to ID Panel     Status: None (Preliminary result)   Collection Time: 05/08/21 10:08 AM   Specimen: BLOOD RIGHT HAND  Result Value Ref Range Status   Specimen Description BLOOD RIGHT HAND  Final   Special Requests   Final    BOTTLES DRAWN AEROBIC AND ANAEROBIC Blood Culture results may not be optimal due to an inadequate volume of blood received in culture bottles   Culture   Final    NO GROWTH 2 DAYS Performed at [facility], [place] [address], [place], [place] [address]    Report Status PENDING  Incomplete    Labs: CBC: Recent Labs  Lab 05/05/21 0321 05/07/21 0357 05/09/21 0159  WBC 17.5* 16.9* 12.3*  NEUTROABS  --  11.4* 8.0*  HGB 9.3* 10.3* 10.0*  HCT 27.4* 31.3* 29.7*  MCV 85.4 84.8 83.9  PLT 334 399 185*   Basic Metabolic Panel: Recent Labs  Lab 05/05/21 0321 05/06/21 0200 05/07/21 0357 05/08/21 0211 05/09/21 0159  NA 129* 131* 134* 134* 134*  K 4.0 3.8 4.2 3.8 3.9  CL 97* 100 101 102 103  CO2 22 23 24 22 22   GLUCOSE 97 97 90 92 90  BUN 23* 23* 19 22* 19  CREATININE 1.04 0.89 1.05 1.06 0.89  CALCIUM 8.6* 8.7* 9.3 9.1 9.3   Liver Function Tests: No results for input(s): AST, ALT, ALKPHOS, BILITOT, PROT, ALBUMIN in the last 168 hours. CBG: Recent Labs  Lab 05/05/21 0620 05/05/21 1154 05/05/21 1720  05/05/21 2033 05/06/21 0622  GLUCAP 105* 111* 105* 116* 93    Discharge time spent: 39 MINUTES.   Signed: Hosie Poisson, MD [facility] 05/11/2021

## 2021-05-12 ENCOUNTER — Telehealth: Payer: Self-pay

## 2021-05-12 NOTE — Telephone Encounter (Signed)
Unsuccessful attempt to reach patient to schedule hospital follow-up appointment and labs within one week.  ?

## 2021-05-13 ENCOUNTER — Other Ambulatory Visit (HOSPITAL_COMMUNITY): Payer: Self-pay | Admitting: Surgery

## 2021-05-13 ENCOUNTER — Other Ambulatory Visit: Payer: Self-pay | Admitting: Surgery

## 2021-05-13 DIAGNOSIS — K8582 Other acute pancreatitis with infected necrosis: Secondary | ICD-10-CM

## 2021-05-13 DIAGNOSIS — K632 Fistula of intestine: Secondary | ICD-10-CM

## 2021-05-13 LAB — CULTURE, BLOOD (ROUTINE X 2)
Culture: NO GROWTH
Culture: NO GROWTH

## 2021-05-16 ENCOUNTER — Telehealth: Payer: Self-pay

## 2021-05-16 NOTE — Progress Notes (Signed)
? ? ?Chronic Care Management ?Pharmacy Assistant  ? ?Name: Scott Foley  MRN: [id number] DOB: 1965/06/09 ? ?Reason for Encounter: Chart Prep for initial visit with CPP  ?  ?Conditions to be addressed/monitored: ?CHF, CAD, HTN, HLD, and Tobacco Use, Pulmonary Embolus, COPD, Acquired Thrombophilia ? ?Recent office visits:  ?04/01/21 Jerrell Belfast NP. Seen for Recurrent Acute Pancreatitis. Referral to Pulmonology and [facility] Coordination. Started Albuterol Sulfate 147mg/act. Started Trelegy Ellipta 100-62.5-25mcg/act. Increased Isosorbide Mononitrate from 30 mg to 60 mg daily.  ?  ?03/22/21 CRochel BromeMD. Seen for Epigastric Pain. No med changes.  ?  ?02/15/21 HJerrell BelfastNP. Seen for CAD. Started Pantoprazole Sodium 40 mg daily. ?  ?12/30/20 CRochel BromeMD. Seen for HLD and HTN. Started on Repatha '140mg'$  every 14 days. Increased Varenicline Tartrate from 0.'5mg'$  to 1 mg 2 times daily. ?  ?11/25/20 CRochel BromeMD. Seen for COPD, HLD and HTN. Started Varenicline Tartrate 0.'5mg'$ . Change otc fish oil to lovaza 1 gm 2 caps twice a day  ? ?Recent consult visits:  ?02/15/21 (Cardiology) RJyl HeinzMD. Seen for CAD. No med changes.  ? ?Hospital visits:  ?#3 Medication Reconciliation was completed by comparing discharge summary, patient?s EMR and Pharmacy list, and upon discussion with patient. ?  ?Admitted to the hospital on 04/13/21 due to Necrotizing Pancreatitis. Discharge date was 05/10/21. [facility] ?  ?Started : ?cyanocobalamin ?feeding supplement ?levofloxacin (Levaquin) ?linezolid (Zyvox) ?multivitamin with minerals ? ?Stopped:  ?ADVIL PM PO ?isosorbide mononitrate 60 MG 24 hr tablet (IMDUR) ?lisinopril 40 MG tablet (ZESTRIL) ? ?All other medications remain the same after Hospital Discharge:??  ? ?# 2 Medication Reconciliation was completed by comparing discharge summary, patient?s EMR and Pharmacy list, and upon discussion with patient. ?  ?Admitted to the  hospital on 02/20/21 due to Abdominal Pain. [facility] date was 02/20/21[facility]   ?  ?Medications remain the same after Hospital Discharge:??  ?  ?# 1 Medication Reconciliation was completed by comparing discharge summary, patient?s EMR and Pharmacy list, and upon discussion with patient. ?  ?Admitted to the hospital on 02/09/21 due to Chest Pain. Discharge date was 02/10/21 [facility]   ?  ?Started Isosorbide Mononitrate '30mg'$  daily ?  ?Stopped Amlodipine 5 mg, Omega 3 1gm and varenicline 1 MG tablet ?  ?All other medications remain the same after Hospital Discharge:??  ?  ?Medication Reconciliation was completed by comparing discharge summary, patient?s EMR and Pharmacy list, and upon discussion with patient. ?  ?Admitted to the hospital on 10/25/20 due to Colonoscopy Propofol. [facility]   ?  ?All medications remain the same after Hospital Discharge:? ? ?Medications: ?Outpatient Encounter Medications as of 05/16/2021  ?Medication Sig Note  ? albuterol (VENTOLIN HFA) 108 (90 Base) MCG/ACT inhaler Inhale 2 puffs into the lungs every 6 (six) hours as needed for wheezing or shortness of breath.   ? apixaban (ELIQUIS) 5 MG TABS tablet Take 1 tablet (5 mg total) by mouth 2 (two) times daily.   ? clopidogrel (PLAVIX) 75 MG tablet Take 1 tablet (75 mg total) by mouth daily.   ? Evolocumab (REPATHA) 140 MG/ML SOSY Inject 140 mg into the skin every 14 (fourteen) days. 04/13/2021: Pt prefer Sunday injections  ? feeding supplement (ENSURE ENLIVE / ENSURE PLUS) LIQD Take 237 mLs by mouth 2 (two) times daily between meals.   ? Fluticasone-Umeclidin-Vilant (TRELEGY ELLIPTA)  100-62.5-25 MCG/ACT AEPB Inhale 1 puff into the lungs daily.   ? levofloxacin (LEVAQUIN) 750 MG tablet Take 1 tablet (750 mg total) by mouth daily for 14 days.   ? linezolid (ZYVOX) 600 MG tablet Take 1 tablet  (600 mg total) by mouth 2 (two) times daily for 14 days.   ? metoprolol tartrate (LOPRESSOR) 50 MG tablet Take 1 tablet (50 mg total) by mouth 2 (two) times daily.   ? Multiple Vitamin (MULTIVITAMIN WITH MINERALS) TABS tablet Take 1 tablet by mouth daily.   ? Omega-3 Fatty Acids (FISH OIL) 1000 MG CAPS Take 1 capsule (1,000 mg total) by mouth 2 (two) times daily.   ? pantoprazole (PROTONIX) 40 MG tablet Take 1 tablet (40 mg total) by mouth 2 (two) times daily.   ? rosuvastatin (CRESTOR) 40 MG tablet Take 1 tablet (40 mg total) by mouth daily. (Patient taking differently: Take 40 mg by mouth at bedtime.)   ? sodium chloride flush (NS) 0.9 % SOLN 10 mLs by Intracatheter route as needed.   ? cyanocobalamin 1000 MCG tablet Take 1 tablet (1,000 mcg total) by mouth daily.   ? ?No facility-administered encounter medications on file as of 05/16/2021.  ? ?Patient Questions:  ?  ?Have you seen any other providers since your last visit with PCP? Yes, Dr. Lilia Pro, general surgeon that took his Gallbladder out  Dr. Bobby Rumpf Hematologist he has an appt with, Dr.Hunsucker, Pulmonologist , and his Gastroenterologist. ?  ?Any changes in your medications or health? Yes, pt stated he has Pancreatitis and has Pulmonary issues and has to be on O2 2 liters.  ?  ?Any side effects from any medications? Pt denied any side effects  ?  ?Do you have an symptoms or problems not managed by your medications? Yes, pt stays tired and weak with no appetite.  ?  ?Any concerns about your health right now? Yes, getting his appetite back and him not eating since having Covid. ?  ?Has your provider asked that you check blood pressure, blood sugar, or follow special diet at home? Yes, he is supposed to be on a bland diet, and provider wants him to start eating leafy vegetables. Pt wife stated he is supposed to take his BP readings before taking his BP medication.  ?  ?Do you get any type of exercise on a regular basis? Yes, they try and walk around the house  but pt is so weak and any exercise wipes him out.  ?  ?Can you think of a goal you would like to reach for your health? Yes, wife wants him to get stronger and feel overall better. She wants him to start eating again like he use to. ?  ?Do you have any problems getting your medications? No ?  ?Is there anything that you would like to discuss during the appointment? They could not think of anything else.  ?  ?  ?Kawan [place]adolid was reminded to have all medications, supplements and any blood glucose and blood pressure readings available for review with [name], Pharm. D, at his telephone visit on 05/19/21 at 8:30am .  ?  ?  ?Star Rating Drugs:  ?Medication:                Last Fill:         Day Supply ?Lisinopril                      04/01/21  90ds ?                                    12/02/20            90ds ?Rosuvastatin               04/01/21            90ds ?                                    12/02/20            90ds ?  ?Care Gaps: ?Last annual wellness visit?08/19/19 ?Colonoscopy: 10/25/20 ?Last eye exam / retinopathy screening?N/A ?Last diabetic foot exam?N/A ?  ?[name], CMA ?Clinical Pharmacist Assistant  ?[phone] ?

## 2021-05-17 ENCOUNTER — Other Ambulatory Visit (HOSPITAL_BASED_OUTPATIENT_CLINIC_OR_DEPARTMENT_OTHER): Payer: Self-pay

## 2021-05-18 ENCOUNTER — Other Ambulatory Visit (HOSPITAL_COMMUNITY): Payer: Self-pay

## 2021-05-19 ENCOUNTER — Other Ambulatory Visit: Payer: Self-pay

## 2021-05-19 ENCOUNTER — Ambulatory Visit (INDEPENDENT_AMBULATORY_CARE_PROVIDER_SITE_OTHER): Payer: Medicare HMO

## 2021-05-19 DIAGNOSIS — E782 Mixed hyperlipidemia: Secondary | ICD-10-CM

## 2021-05-19 DIAGNOSIS — Z862 Personal history of diseases of the blood and blood-forming organs and certain disorders involving the immune mechanism: Secondary | ICD-10-CM

## 2021-05-19 DIAGNOSIS — Z9981 Dependence on supplemental oxygen: Secondary | ICD-10-CM

## 2021-05-19 DIAGNOSIS — J9611 Chronic respiratory failure with hypoxia: Secondary | ICD-10-CM

## 2021-05-19 DIAGNOSIS — I119 Hypertensive heart disease without heart failure: Secondary | ICD-10-CM

## 2021-05-19 DIAGNOSIS — I2782 Chronic pulmonary embolism: Secondary | ICD-10-CM

## 2021-05-19 DIAGNOSIS — I2510 Atherosclerotic heart disease of native coronary artery without angina pectoris: Secondary | ICD-10-CM

## 2021-05-19 DIAGNOSIS — J449 Chronic obstructive pulmonary disease, unspecified: Secondary | ICD-10-CM

## 2021-05-19 DIAGNOSIS — Z8673 Personal history of transient ischemic attack (TIA), and cerebral infarction without residual deficits: Secondary | ICD-10-CM

## 2021-05-19 MED ORDER — METOPROLOL TARTRATE 50 MG PO TABS: 50.0000 mg | ORAL_TABLET | Freq: Two times a day (BID) | ORAL | 0 refills | Status: DC

## 2021-05-19 MED ORDER — ROSUVASTATIN CALCIUM 40 MG PO TABS: 40.0000 mg | ORAL_TABLET | Freq: Every day | ORAL | 0 refills | Status: DC

## 2021-05-19 MED ORDER — TRELEGY ELLIPTA 100-62.5-25 MCG/ACT IN AEPB: 1.0000 | INHALATION_SPRAY | Freq: Every day | RESPIRATORY_TRACT | 11 refills | Status: DC

## 2021-05-19 MED ORDER — CYANOCOBALAMIN 1000 MCG PO TABS: 1000.0000 ug | ORAL_TABLET | Freq: Every day | ORAL | 1 refills | Status: DC

## 2021-05-19 MED ORDER — APIXABAN 5 MG PO TABS: 5.0000 mg | ORAL_TABLET | Freq: Two times a day (BID) | ORAL | 0 refills | Status: DC

## 2021-05-19 MED ORDER — REPATHA 140 MG/ML ~~LOC~~ SOSY: 140.0000 mg | PREFILLED_SYRINGE | SUBCUTANEOUS | 0 refills | Status: DC

## 2021-05-19 MED ORDER — CLOPIDOGREL BISULFATE 75 MG PO TABS: 75.0000 mg | ORAL_TABLET | Freq: Every day | ORAL | 0 refills | Status: DC

## 2021-05-19 MED ORDER — PANTOPRAZOLE SODIUM 40 MG PO TBEC: 40.0000 mg | DELAYED_RELEASE_TABLET | Freq: Two times a day (BID) | ORAL | 1 refills | Status: DC

## 2021-05-19 NOTE — Patient Instructions (Signed)
Visit Information   Goals Addressed             This Visit's Progress    Manage My Medicine       Timeframe:  Long-Range Goal Priority:  High Start Date:                             Expected End Date:                       Follow Up Date 05/20/22   - call for medicine refill 2 or 3 days before it runs out    Why is this important?   These steps will help you keep on track with your medicines.   Notes:      Track and Manage My Blood Pressure-Hypertension       Timeframe:  Long-Range Goal Priority:  High Start Date:                             Expected End Date:                       Follow Up Date 05/20/22   - check blood pressure 3 times per week    Why is this important?   You won't feel high blood pressure, but it can still hurt your blood vessels.  High blood pressure can cause heart or kidney problems. It can also cause a stroke.  Making lifestyle changes like losing a little weight or eating less salt will help.  Checking your blood pressure at home and at different times of the day can help to control blood pressure.  If the doctor prescribes medicine remember to take it the way the doctor ordered.  Call the office if you cannot afford the medicine or if there are questions about it.     Notes:        Patient Care Plan: CCM Pharmacy Care Plan     Problem Identified: Disease State Management   Priority: High  Onset Date: 05/19/2021     Long-Range Goal: Lipids, Cardio, GERD   Start Date: 05/19/2021  Expected End Date: 05/19/2022  This Visit's Progress: On track  Priority: High  Note:   Current Barriers:  Does not contact provider office for questions/concerns  Pharmacist Clinical Goal(s):  Patient will verbalize ability to afford treatment regimen through collaboration with PharmD and provider.   Interventions: 1:1 collaboration with [name], Elnita Maxwell, MD regarding development and update of comprehensive plan of care as evidenced by provider attestation and  co-signature Inter-disciplinary care team collaboration (see longitudinal plan of care) Comprehensive medication review performed; medication list updated in electronic medical record  Hypertension (BP goal <130/80) BP Readings from Last 3 Encounters:  05/10/21 122/90  04/01/21 128/82  02/20/21 (!) 167/96  -Controlled -Current treatment: Metoprolol Tart '50mg'$  BID Appropriate, Effective, Safe, Accessible -Medications previously tried: Lisinopril (DC Feb 2023)  -Current home readings: Doesn't test -Current dietary habits: "Tries to eat healthy" -Current exercise habits: loves to hike but is unable after recent hospitalizations -Denies hypotensive/hypertensive symptoms -Educated on BP goals and benefits of medications for prevention of heart attack, stroke and kidney damage; -Counseled to monitor BP at home 3x/week, document, and provide log at future appointments -Recommended to continue current medication  Hx PE (2014) (Goal: Prevent recurrence) Wt Readings from Last 3 Encounters:  05/09/21 174 lb  9.7 oz (79.2 kg)  04/01/21 187 lb (84.8 kg)  02/20/21 211 lb (95.7 kg)   Lab Results  Component Value Date   CREATININE 0.89 05/09/2021  -Controlled -Current treatment  Eliquis '5mg'$  BID Appropriate, Effective, Safe, Accessible -Medications previously tried: N/A  -Recommended to continue current medication   Hyperlipidemia: (LDL goal < 70) The ASCVD Risk score (Arnett DK, et al., 2019) failed to calculate for the following reasons:   The valid total cholesterol range is 130 to 320 mg/dL Lab Results  Component Value Date   CHOL 99 (L) 02/21/2021   CHOL 252 (H) 11/25/2020   CHOL 226 (H) 08/23/2020   Lab Results  Component Value Date   HDL 30 (L) 02/21/2021   HDL 28 (L) 11/25/2020   HDL 35 (L) 08/23/2020   Lab Results  Component Value Date   LDLCALC 47 02/21/2021   LDLCALC 178 (H) 11/25/2020   LDLCALC 156 (H) 08/23/2020   Lab Results  Component Value Date   TRIG 121  02/21/2021   TRIG 240 (H) 11/25/2020   TRIG 192 (H) 08/23/2020   Lab Results  Component Value Date   CHOLHDL 3.3 02/21/2021   CHOLHDL 9.0 (H) 11/25/2020   CHOLHDL 6.5 (H) 08/23/2020  No results found for: LDLDIRECT -Controlled -Current treatment: Rosuvastatin '40mg'$  Appropriate, Effective, Safe, Accessible Repatha '140mg'$  Appropriate, Effective, Safe, Accessible Fish Oil '1000mg'$  BID Appropriate, Effective, Safe, Accessible Clopidogrel '75mg'$  Appropriate, Effective, Safe, Accessible -Medications previously tried: N/A  -Current dietary patterns: "Tries to eat healthy" -Current exercise habits: loves to hike but is unable after recent hospitalizations -Educated on Cholesterol goals;  March 2023: Will try to get patient on Vascepa at future visit, don't want to overwhelm too much today  COPD (Goal: control symptoms and prevent exacerbations) Pulmonary Functions Testing Results:  No results found for: FEV1, FVC, FEV1FVC, TLC, DLCO No flowsheet data found. CBC    Component Value Date/Time   WBC 12.3 (H) 05/09/2021 0159   RBC 3.54 (L) 05/09/2021 0159   HGB 10.0 (L) 05/09/2021 0159   HGB 11.6 (L) 04/01/2021 1047   HCT 29.7 (L) 05/09/2021 0159   HCT 34.0 (L) 04/01/2021 1047   PLT 431 (H) 05/09/2021 0159   PLT 417 04/01/2021 1047   MCV 83.9 05/09/2021 0159   MCV 86 04/01/2021 1047   MCH 28.2 05/09/2021 0159   MCHC 33.7 05/09/2021 0159   RDW 16.5 (H) 05/09/2021 0159   RDW 14.2 04/01/2021 1047   LYMPHSABS 2.6 05/09/2021 0159   LYMPHSABS 2.3 04/01/2021 1047   MONOABS 1.1 (H) 05/09/2021 0159   EOSABS 0.5 05/09/2021 0159   EOSABS 0.2 04/01/2021 1047   BASOSABS 0.1 05/09/2021 0159   BASOSABS 0.1 04/01/2021 1047  -Controlled -Current treatment  Albuterol PRN Appropriate, Effective, Safe, Accessible Trelegy Appropriate, Effective, Safe, Accessible -Medications previously tried: N/A  -Gold Grade: Unknown -Current COPD Classification:  Unknown -MMRC/CAT score: Unknown -Pulmonary  function testing: Unknown -Exacerbations requiring treatment in last 6 months: None -Patient denies consistent use of maintenance inhaler -Frequency of rescue inhaler use: PRN March 2023: Patient's main priority was operations of meds in general (Upstream). Unable to get CAT score or do full assessment. Will do at future visit and go more in-depth then. Spoke long enough to know patient is content on therapy  GERD (Goal: minimize symptoms of reflux ) -Controlled -Current treatment  Pantoprazole '40mg'$  Appropriate, Effective, Safe, Accessible -Medications previously tried: none reported  -Triggering factors: N/A -Hx of Bleeds/ulcers: No -Counseled on small meals, elevating head, and  sleeping on left side -Recommended to continue current medication   Patient Goals/Self-Care Activities Patient will:  - take medications as prescribed as evidenced by patient report and record review  Follow Up Plan: The patient has been provided with contact information for the care management team and has been advised to call with any health related questions or concerns.   CPP F/U April 2023  [name], Sherian Rein.D. - [phone]       Mr. Thomure was given information about Chronic Care Management services today including:  CCM service includes personalized support from designated clinical staff supervised by his physician, including individualized plan of care and coordination with other care providers 24/7 contact phone numbers for assistance for urgent and routine care needs. Standard insurance, coinsurance, copays and deductibles apply for chronic care management only during months in which we provide at least 20 minutes of these services. Most insurances cover these services at 100%, however patients may be responsible for any copay, coinsurance and/or deductible if applicable. This service may help you avoid the need for more expensive face-to-face services. Only one practitioner may furnish and  bill the service in a calendar month. The patient may stop CCM services at any time (effective at the end of the month) by phone call to the office staff.  Patient agreed to services and verbal consent obtained.   The patient verbalized understanding of instructions, educational materials, and care plan provided today and declined offer to receive copy of patient instructions, educational materials, and care plan.  The pharmacy team will reach out to the patient again over the next 60 days.   Lane Hacker, [place]

## 2021-05-19 NOTE — Progress Notes (Cosign Needed)
Chronic Care Management Pharmacy Note  05/19/2021 Name:  Niv Darley MRN:  [id number] DOB:  1965-08-25  Summary: -Pleasant 56 year old male and his wife Scott Foley present for initial CCM visit. They were both CNA's. Patient used to love hiking but is unable to do now with his recent hospital problems. He enjoys watching movies, specifically comedies. He has been married for 21 years with 2 Children and 3 dogs. They've been living in a campground for 3 years while they wait to renovate their home. It's been put on pause due to recent medical issues.  Recommendations/Changes made from today's visit: -Onboard to [facility] -PFT to determine risk/class for COPD  Subjective: Olie Dibert is an 56 y.o. year old male who is a primary patient of [name], [name], MD.  The CCM team was consulted for assistance with disease management and care coordination needs.    Engaged with patient by telephone for initial visit in response to provider referral for pharmacy case management and/or care coordination services.   Consent to Services:  The patient was given the following information about Chronic Care Management services today, agreed to services, and gave verbal consent: 1. CCM service includes personalized support from designated clinical staff supervised by the primary care provider, including individualized plan of care and coordination with other care providers 2. 24/7 contact phone numbers for assistance for urgent and routine care needs. 3. Service will only be billed when office clinical staff spend 20 minutes or more in a month to coordinate care. 4. Only one practitioner may furnish and bill the service in a calendar month. 5.The patient may stop CCM services at any time (effective at the end of the month) by phone call to the office staff. 6. The patient will be responsible for cost sharing (co-pay) of up to 20% of the service fee (after annual deductible is met). Patient agreed to  services and consent obtained.  Patient Care Team: [name], MD as PCP - General (Family Medicine) Sanda Klein, MD as PCP - Cardiology (Cardiology) Lane Hacker, [facility] as Pharmacist (Pharmacist)  Recent office visits:  04/01/21 Jerrell Belfast NP. Seen for Recurrent Acute Pancreatitis. Referral to Pulmonology and [facility] Coordination. Started Albuterol Sulfate 142mg/act. Started Trelegy Ellipta 100-62.5-269mcg/act. Increased Isosorbide Mononitrate from 30 mg to 60 mg daily.    03/22/21 CRochel BromeMD. Seen for Epigastric Pain. No med changes.    02/15/21 HJerrell BelfastNP. Seen for CAD. Started Pantoprazole Sodium 40 mg daily.   12/30/20 CRochel BromeMD. Seen for HLD and HTN. Started on Repatha 1433mevery 14 days. Increased Varenicline Tartrate from 0.69m39mo 1 mg 2 times daily.   11/25/20 CoxRochel Brome. Seen for COPD, HLD and HTN. Started Varenicline Tartrate 0.69mg36mhange otc fish oil to lovaza 1 gm 2 caps twice a day    Recent consult visits:  02/15/21 (Cardiology) RevaJyl Heinz Seen for CAD. No med changes.    Hospital visits:  #3 Medication Reconciliation was completed by comparing discharge summary, patients EMR and Pharmacy list, and upon discussion with patient.   Admitted to the hospital on 04/13/21 due to Necrotizing Pancreatitis. Discharge date was 05/10/21[facility] levofloxacin (Levaquin) linezolid (Zyvox) multivitamin with minerals   Stopped:  ADVIL PM PO isosorbide mononitrate 60 MG 24 hr tablet (IMDUR) lisinopril 40 MG tablet (ZESTRIL)   All other medications remain the same after Hospital Discharge:??    #  2 Medication Reconciliation was completed by comparing discharge summary, patients EMR and Pharmacy list, and upon discussion with patient.   Admitted to the hospital on 02/20/21 due to Abdominal Pain. Discharge date was 02/20/21. Discharged from  [facility].     Medications remain the same after Hospital Discharge:??    # 1 Medication Reconciliation was completed by comparing discharge summary, patients EMR and Pharmacy list, and upon discussion with patient.   Admitted to the hospital on 02/09/21 due to Chest Pain. Discharge date was 02/10/21 Discharged from [facility].     Started Isosorbide Mononitrate 14m daily   Stopped Amlodipine 5 mg, Omega 3 1gm and varenicline 1 MG tablet   All other medications remain the same after Hospital Discharge:??    Medication Reconciliation was completed by comparing discharge summary, patients EMR and Pharmacy list, and upon discussion with patient.   Admitted to the hospital on 10/25/20 due to Colonoscopy Propofol. [facility]     All medications remain the same after Hospital Discharge:?   Objective:  Lab Results  Component Value Date   CREATININE 0.89 05/09/2021   BUN 19 05/09/2021   EGFR 51 (L) 04/01/2021   GFRNONAA >60 05/09/2021   GFRAA 105 12/15/2019   NA 134 (L) 05/09/2021   K 3.9 05/09/2021   CALCIUM 9.3 05/09/2021   CO2 22 05/09/2021   GLUCOSE 90 05/09/2021    No results found for: HGBA1C, FRUCTOSAMINE, GFR, MICROALBUR  Last diabetic Eye exam: No results found for: HMDIABEYEEXA  Last diabetic Foot exam: No results found for: HMDIABFOOTEX   Lab Results  Component Value Date   CHOL 99 (L) 02/21/2021   HDL 30 (L) 02/21/2021   LDLCALC 47 02/21/2021   TRIG 121 02/21/2021   CHOLHDL 3.3 02/21/2021    Hepatic Function Latest Ref Rng & Units 04/18/2021 04/17/2021 04/16/2021  Total Protein 6.5 - 8.1 g/dL 4.8(L) 5.3(L) 5.8(L)  Albumin 3.5 - 5.0 g/dL <1.5(L) 1.5(L) 1.7(L)  AST 15 - 41 U/L '24 16 18  ' ALT 0 - 44 U/L '17 15 17  ' Alk Phosphatase 38 - 126 U/L 77 67 92  Total Bilirubin 0.3 - 1.2 mg/dL 0.4 0.2(L) 0.3    Lab Results  Component Value Date/Time   TSH  1.870 11/25/2020 10:32 AM   TSH 2.390 08/23/2020 09:31 AM    CBC Latest Ref Rng & Units 05/09/2021 05/07/2021 05/05/2021  WBC 4.0 - 10.5 K/uL 12.3(H) 16.9(H) 17.5(H)  Hemoglobin 13.0 - 17.0 g/dL 10.0(L) 10.3(L) 9.3(L)  Hematocrit 39.0 - 52.0 % 29.7(L) 31.3(L) 27.4(L)  Platelets 150 - 400 K/uL 431(H) 399 334    No results found for: VD25OH  Clinical ASCVD: Yes  The ASCVD Risk score (Arnett DK, et al., 2019) failed to calculate for the following reasons:   The valid total cholesterol range is 130 to 320 mg/dL    [facility] 12/30/2020 08/23/2020 08/19/2019  Decreased Interest 0 0 0  Down, Depressed, Hopeless 0 0 0  PHQ - 2 Score 0 0 0     Other: (CHADS2VASc if Afib, MMRC or CAT for COPD, ACT, DEXA)  Social History   Tobacco Use  Smoking Status Former   Packs/day: 0.75   Years: 30.00   Pack years: 22.50   Types: Cigarettes   Start date: 03/14/1979   Quit date: 11/2020   Years since quitting: 0.5  Smokeless Tobacco Never  Tobacco Comments   Will start chantix.  BP Readings from Last 3 Encounters:  05/10/21 122/90  04/01/21 128/82  02/20/21 (!) 167/96   Pulse Readings from Last 3 Encounters:  05/10/21 82  04/01/21 89  02/20/21 (!) 55   Wt Readings from Last 3 Encounters:  05/09/21 174 lb 9.7 oz (79.2 kg)  04/01/21 187 lb (84.8 kg)  02/20/21 211 lb (95.7 kg)   BMI Readings from Last 3 Encounters:  05/09/21 29.97 kg/m  04/01/21 32.10 kg/m  02/20/21 36.22 kg/m    Assessment/Interventions: Review of patient past medical history, allergies, medications, health status, including review of consultants reports, laboratory and other test data, was performed as part of comprehensive evaluation and provision of chronic care management services.   SDOH:  (Social Determinants of Health) assessments and interventions performed: Yes SDOH Interventions    Flowsheet Row Most Recent Value  SDOH Interventions   Financial Strain Interventions Other (Comment)   [Sync Meds to be delievered beginning of month (Paid on 3rd of month)]  Transportation Interventions Intervention Not Indicated      SDOH Screenings   Alcohol Screen: Low Risk    Last Alcohol Screening Score (AUDIT): 0  Depression (PHQ2-9): Low Risk    PHQ-2 Score: 0  Financial Resource Strain: Medium Risk   Difficulty of Paying Living Expenses: Somewhat hard  Food Insecurity: Not on file  Housing: Not on file  Physical Activity: Not on file  Social Connections: Not on file  Stress: Not on file  Tobacco Use: Medium Risk   Smoking Tobacco Use: Former   Smokeless Tobacco Use: Never   Passive Exposure: Not on file  Transportation Needs: No Transportation Needs   Lack of Transportation (Medical): No   Lack of Transportation (Non-Medical): No    CCM Care Plan  Allergies  Allergen Reactions   Penicillin G Hives    Medications Reviewed Today     Reviewed by Lane Hacker, [facility] (Pharmacist) on 05/19/21 at [phone]  Med List Status: <None>   Medication Order Taking? Sig Documenting Provider Last Dose Status Informant  albuterol (VENTOLIN HFA) 108 (90 Base) MCG/ACT inhaler [id number] Yes Inhale 2 puffs into the lungs every 6 (six) hours as needed for wheezing or shortness of breath. Rip Harbour, NP Taking Active Self  apixaban (ELIQUIS) 5 MG TABS tablet [id number] Yes Take 1 tablet (5 mg total) by mouth 2 (two) times daily. Rip Harbour, NP Taking Active Self  clopidogrel (PLAVIX) 75 MG tablet [id number] Yes Take 1 tablet (75 mg total) by mouth daily. Rip Harbour, NP Taking Active Self  cyanocobalamin 1000 MCG tablet [id number] Yes Take 1 tablet (1,000 mcg total) by mouth daily. Hosie Poisson, MD Taking Active   Evolocumab (REPATHA) 140 MG/ML SOSY [id number] Yes Inject 140 mg into the skin every 14 (fourteen) days. [name], MD Taking Active Self           Med Note Marvell Fuller   Wed Apr 13, 2021  6:59 PM) Pt prefer Sunday injections  feeding supplement  (ENSURE ENLIVE / ENSURE PLUS) LIQD [id number]  Take 237 mLs by mouth 2 (two) times daily between meals. Hosie Poisson, MD  Active   Fluticasone-Umeclidin-[facility] ELLIPTA) 100-62.5-25 MCG/ACT AEPB [id number] Yes Inhale 1 puff into the lungs daily. Rip Harbour, NP Taking Active Self  levofloxacin (LEVAQUIN) 750 MG tablet [id number] Yes Take 1 tablet (750 mg total) by mouth daily for 14 days. Hosie Poisson, MD Taking Active   linezolid (ZYVOX) 600 MG tablet [id number] Yes Take 1 tablet (600  mg total) by mouth 2 (two) times daily for 14 days. Hosie Poisson, MD Taking Active   metoprolol tartrate (LOPRESSOR) 50 MG tablet [id number] Yes Take 1 tablet (50 mg total) by mouth 2 (two) times daily. Rip Harbour, NP Taking Active Self  Multiple Vitamin (MULTIVITAMIN WITH MINERALS) TABS tablet [id number]  Take 1 tablet by mouth daily. Hosie Poisson, MD  Active   Omega-3 Fatty Acids (FISH OIL) 1000 MG CAPS [id number] Yes Take 1 capsule (1,000 mg total) by mouth 2 (two) times daily. [name], [name], MD Taking Active Self  pantoprazole (PROTONIX) 40 MG tablet [id number] Yes Take 1 tablet (40 mg total) by mouth 2 (two) times daily. Hosie Poisson, MD Taking Active   rosuvastatin (CRESTOR) 40 MG tablet [id number] Yes Take 1 tablet (40 mg total) by mouth daily.  Patient taking differently: Take 40 mg by mouth at bedtime.   Rip Harbour, NP Taking Active Self  sodium chloride flush (NS) 0.9 % SOLN [id number]  10 mLs by Intracatheter route as needed. Hosie Poisson, MD  Active             Patient Active Problem List   Diagnosis Date Noted   Pericardial tamponade    Pericardial effusion 04/29/2021   Normocytic anemia 04/15/2021   Hypoglycemia 04/15/2021   Protein-calorie malnutrition, severe 04/15/2021   Sepsis ([place]) 04/13/2021   AKI (acute kidney injury) ([place]) 04/13/2021   Hypokalemia 04/13/2021   Necrotizing pancreatitis 04/13/2021   Chronic respiratory failure with hypoxia ([place]) 04/13/2021    Essential hypertension 02/15/2021   History of pulmonary embolism 02/15/2021   Chest pain 02/09/2021   Hypertensive heart disease without congestive heart failure 12/06/2020   Acquired thrombophilia ([place]) 12/06/2020   Morbid obesity ([place]) 12/06/2020   Colon polyp 10/25/2020   [place], post-stroke 07/28/2019   DNR (do not resuscitate) 03/03/2019   Chronic obstructive pulmonary disease ([place]) 07/16/2018   Closed rib fracture right 9th and 10th 07/16/2018   Pulmonary nodules/lesions, multiple 11/25/2012   Coronary artery disease with stable angina pectoris ([place])    Hyperlipemia     Immunization History  Administered Date(s) Administered   Influenza Inj Mdck Quad Pf 12/15/2019, 12/30/2020   Influenza Split 01/03/2012   Influenza,inj,Quad PF,6+ Mos 11/25/2012   PNEUMOCOCCAL CONJUGATE-20 12/30/2020   Pneumococcal Polysaccharide-23 11/25/2012    Conditions to be addressed/monitored:  Hypertension, Hyperlipidemia, GERD, and COPD  Care Plan : Eclectic  Updates made by Lane Hacker, [place] since 05/19/2021 12:00 AM     Problem: Disease State Management   Priority: High  Onset Date: 05/19/2021     Long-Range Goal: Lipids, Cardio, GERD   Start Date: 05/19/2021  Expected End Date: 05/19/2022  This Visit's Progress: On track  Priority: High  Note:   Current Barriers:  Does not contact provider office for questions/concerns  Pharmacist Clinical Goal(s):  Patient will verbalize ability to afford treatment regimen through collaboration with PharmD and provider.   Interventions: 1:1 collaboration with [name], MD regarding development and update of comprehensive plan of care as evidenced by provider attestation and co-signature Inter-disciplinary care team collaboration (see longitudinal plan of care) Comprehensive medication review performed; medication list updated in electronic medical record  Hypertension (BP goal <130/80) BP Readings from Last 3 Encounters:   05/10/21 122/90  04/01/21 128/82  02/20/21 (!) 167/96  -Controlled -Current treatment: Metoprolol Tart 68m BID Appropriate, Effective, Safe, Accessible -Medications previously tried: Lisinopril (DC Feb 2023)  -Current home readings: Doesn't test -Current dietary habits: "Tries to  eat healthy" -Current exercise habits: loves to hike but is unable after recent hospitalizations -Denies hypotensive/hypertensive symptoms -Educated on BP goals and benefits of medications for prevention of heart attack, stroke and kidney damage; -Counseled to monitor BP at home 3x/week, document, and provide log at future appointments -Recommended to continue current medication  Hx PE (2014) (Goal: Prevent recurrence) Wt Readings from Last 3 Encounters:  05/09/21 174 lb 9.7 oz (79.2 kg)  04/01/21 187 lb (84.8 kg)  02/20/21 211 lb (95.7 kg)   Lab Results  Component Value Date   CREATININE 0.89 05/09/2021  -Controlled -Current treatment  Eliquis 45m BID Appropriate, Effective, Safe, Accessible -Medications previously tried: N/A  -Recommended to continue current medication   Hyperlipidemia: (LDL goal < 70) The ASCVD Risk score (Arnett DK, et al., 2019) failed to calculate for the following reasons:   The valid total cholesterol range is 130 to 320 mg/dL Lab Results  Component Value Date   CHOL 99 (L) 02/21/2021   CHOL 252 (H) 11/25/2020   CHOL 226 (H) 08/23/2020   Lab Results  Component Value Date   HDL 30 (L) 02/21/2021   HDL 28 (L) 11/25/2020   HDL 35 (L) 08/23/2020   Lab Results  Component Value Date   LDLCALC 47 02/21/2021   LDLCALC 178 (H) 11/25/2020   LDLCALC 156 (H) 08/23/2020   Lab Results  Component Value Date   TRIG 121 02/21/2021   TRIG 240 (H) 11/25/2020   TRIG 192 (H) 08/23/2020   Lab Results  Component Value Date   CHOLHDL 3.3 02/21/2021   CHOLHDL 9.0 (H) 11/25/2020   CHOLHDL 6.5 (H) 08/23/2020  No results found for: LDLDIRECT -Controlled -Current  treatment: Rosuvastatin 457mAppropriate, Effective, Safe, Accessible Repatha 14033mppropriate, Effective, Safe, Accessible Fish Oil 1000m35mD Appropriate, Effective, Safe, Accessible Clopidogrel 75mg85mropriate, Effective, Safe, Accessible -Medications previously tried: N/A  -Current dietary patterns: "Tries to eat healthy" -Current exercise habits: loves to hike but is unable after recent hospitalizations -Educated on Cholesterol goals;  March 2023: Will try to get patient on Vascepa at future visit, don't want to overwhelm too much today  COPD (Goal: control symptoms and prevent exacerbations) Pulmonary Functions Testing Results:  No results found for: FEV1, FVC, FEV1FVC, TLC, DLCO No flowsheet data found. CBC    Component Value Date/Time   WBC 12.3 (H) 05/09/2021 0159   RBC 3.54 (L) 05/09/2021 0159   HGB 10.0 (L) 05/09/2021 0159   HGB 11.6 (L) 04/01/2021 1047   HCT 29.7 (L) 05/09/2021 0159   HCT 34.0 (L) 04/01/2021 1047   PLT 431 (H) 05/09/2021 0159   PLT 417 04/01/2021 1047   MCV 83.9 05/09/2021 0159   MCV 86 04/01/2021 1047   MCH 28.2 05/09/2021 0159   MCHC 33.7 05/09/2021 0159   RDW 16.5 (H) 05/09/2021 0159   RDW 14.2 04/01/2021 1047   LYMPHSABS 2.6 05/09/2021 0159   LYMPHSABS 2.3 04/01/2021 1047   MONOABS 1.1 (H) 05/09/2021 0159   EOSABS 0.5 05/09/2021 0159   EOSABS 0.2 04/01/2021 1047   BASOSABS 0.1 05/09/2021 0159   BASOSABS 0.1 04/01/2021 1047  -Controlled -Current treatment  Albuterol PRN Appropriate, Effective, Safe, Accessible Trelegy Appropriate, Effective, Safe, Accessible -Medications previously tried: N/A  -Gold Grade: Unknown -Current COPD Classification:  Unknown -MMRC/CAT score: Unknown -Pulmonary function testing: Unknown -Exacerbations requiring treatment in last 6 months: None -Patient denies consistent use of maintenance inhaler -Frequency of rescue inhaler use: PRN March 2023: Patient's main priority was operations of meds in  general  ([facility]). Unable to get CAT score or do full assessment. Will do at future visit and go more in-depth then. Spoke long enough to know patient is content on therapy  GERD (Goal: minimize symptoms of reflux ) -Controlled -Current treatment  Pantoprazole 22m Appropriate, Effective, Safe, Accessible -Medications previously tried: none reported  -Triggering factors: N/A -Hx of Bleeds/ulcers: No -Counseled on small meals, elevating head, and sleeping on left side -Recommended to continue current medication   Patient Goals/Self-Care Activities Patient will:  - take medications as prescribed as evidenced by patient report and record review  Follow Up Plan: The patient has been provided with contact information for the care management team and has been advised to call with any health related questions or concerns.   CPP F/U April 2023  NAri[place] Constable PSherian ReinD. - [phone]        Medication Assistance: None required.  Patient affirms current coverage meets needs.  Compliance/Adherence/Medication fill history: Star Rating Drugs:  Medication:                Last Fill:         Day Supply Lisinopril                      04/01/21            90ds                                     12/02/20            90ds Rosuvastatin               04/01/21            90ds                                     12/02/20            90ds   Care Gaps: Last annual wellness visit?08/19/19 Colonoscopy: 10/25/20 Last eye exam / retinopathy screening?N/A Last diabetic foot exam?N/A    [place]:  [place] [place]Phone: [phone]: [phone] [id number] [place] [id number]: [phone] Fax: [phone] [address]:  [phone]: [phone] pill box? No -   Pt endorses 80% compliance  We discussed: Benefits of medication synchronization, packaging and delivery as well as enhanced pharmacist oversight with [facility]. Patient decided to: Utilize [facility] pharmacy for medication synchronization, packaging and delivery -Verbal consent obtained for [facility] enhanced pharmacy services (medication synchronization, adherence packaging, delivery coordination). A medication sync plan was created to allow patient to get all medications delivered once every 30 to 90 days per patient preference. Patient understands they have freedom to choose pharmacy and clinical pharmacist will coordinate care between all prescribers and [facility].  Care Plan and Follow Up Patient Decision:  Patient agrees to Care Plan and Follow-up.  Plan: The patient has been provided with contact information for the care management team and has been advised to call with any health related questions or concerns.   CPP  F/U May 2023  [name], Sherian Rein.D. - [phone]

## 2021-05-20 ENCOUNTER — Other Ambulatory Visit: Payer: Self-pay

## 2021-05-20 ENCOUNTER — Ambulatory Visit (INDEPENDENT_AMBULATORY_CARE_PROVIDER_SITE_OTHER): Payer: Medicare HMO | Admitting: Family Medicine

## 2021-05-20 ENCOUNTER — Encounter: Payer: Self-pay | Admitting: Family Medicine

## 2021-05-20 VITALS — BP 112/68 | HR 78 | Temp 97.6°F | Ht 64.0 in | Wt 175.0 lb

## 2021-05-20 DIAGNOSIS — I119 Hypertensive heart disease without heart failure: Secondary | ICD-10-CM

## 2021-05-20 DIAGNOSIS — I2583 Coronary atherosclerosis due to lipid rich plaque: Secondary | ICD-10-CM

## 2021-05-20 DIAGNOSIS — D6869 Other thrombophilia: Secondary | ICD-10-CM | POA: Diagnosis not present

## 2021-05-20 DIAGNOSIS — K859 Acute pancreatitis without necrosis or infection, unspecified: Secondary | ICD-10-CM

## 2021-05-20 DIAGNOSIS — K8591 Acute pancreatitis with uninfected necrosis, unspecified: Secondary | ICD-10-CM | POA: Diagnosis not present

## 2021-05-20 DIAGNOSIS — Z862 Personal history of diseases of the blood and blood-forming organs and certain disorders involving the immune mechanism: Secondary | ICD-10-CM

## 2021-05-20 DIAGNOSIS — E782 Mixed hyperlipidemia: Secondary | ICD-10-CM | POA: Diagnosis not present

## 2021-05-20 DIAGNOSIS — Z8673 Personal history of transient ischemic attack (TIA), and cerebral infarction without residual deficits: Secondary | ICD-10-CM

## 2021-05-20 DIAGNOSIS — I2510 Atherosclerotic heart disease of native coronary artery without angina pectoris: Secondary | ICD-10-CM | POA: Diagnosis not present

## 2021-05-20 DIAGNOSIS — K861 Other chronic pancreatitis: Secondary | ICD-10-CM | POA: Diagnosis not present

## 2021-05-20 DIAGNOSIS — I3139 Other pericardial effusion (noninflammatory): Secondary | ICD-10-CM | POA: Diagnosis not present

## 2021-05-20 NOTE — Progress Notes (Unsigned)
Subjective:  Patient ID: Scott Foley, male    DOB: September 10, 1965  Age: 56 y.o. MRN: [id number]  Chief Complaint  Patient presents with   Hospitalization Follow-up    Follow up Hospitalization Patient was admitted to [facility] for 2 weeks 03/15/21- and then discharged home. Patient had covid 19 and then pancreatitis. Dr. Lilia Pro recommended readmit, and then went to [facility] and was admitted to [facility] on 04/13/21 and discharged on 05/10/21. He was treated for Necrotizing pancreatitis present on admission. Treatment for this included IV antibiotics, Abdominal drain placement, 14 days outpatient antibiotics. Telephone follow up tried to be attempted and never received phone call back. He reports good compliance with treatment. He reports this condition is improved.   Current Outpatient Medications on File Prior to Visit  Medication Sig Dispense Refill   albuterol (VENTOLIN HFA) 108 (90 Base) MCG/ACT inhaler Inhale 2 puffs into the lungs every 6 (six) hours as needed for wheezing or shortness of breath. 8 g 0   apixaban (ELIQUIS) 5 MG TABS tablet Take 1 tablet (5 mg total) by mouth 2 (two) times daily. 90 tablet 0   clopidogrel (PLAVIX) 75 MG tablet Take 1 tablet (75 mg total) by mouth daily. 90 tablet 0   cyanocobalamin 1000 MCG tablet Take 1 tablet (1,000 mcg total) by mouth daily. 30 tablet 1   Evolocumab (REPATHA) 140 MG/ML SOSY Inject 140 mg into the skin every 14 (fourteen) days. 6 mL 0   feeding supplement (ENSURE ENLIVE / ENSURE PLUS) LIQD Take 237 mLs by mouth 2 (two) times daily between meals. 14220 mL 0   Fluticasone-Umeclidin-Vilant (TRELEGY ELLIPTA) 100-62.5-25 MCG/ACT AEPB Inhale 1 puff into the lungs daily. 1 each 11   levofloxacin (LEVAQUIN) 750 MG tablet Take 1 tablet (750 mg total) by mouth daily for 14 days. 14 tablet 0   linezolid (ZYVOX) 600 MG tablet Take 1 tablet (600 mg total) by mouth 2 (two) times daily for 14 days. 28 tablet 0   metoprolol tartrate  (LOPRESSOR) 50 MG tablet Take 1 tablet (50 mg total) by mouth 2 (two) times daily. 180 tablet 0   Multiple Vitamin (MULTIVITAMIN WITH MINERALS) TABS tablet Take 1 tablet by mouth daily.     Omega-3 Fatty Acids (FISH OIL) 1000 MG CAPS Take 1 capsule (1,000 mg total) by mouth 2 (two) times daily. 180 capsule 0   pantoprazole (PROTONIX) 40 MG tablet Take 1 tablet (40 mg total) by mouth 2 (two) times daily. 60 tablet 1   rosuvastatin (CRESTOR) 40 MG tablet Take 1 tablet (40 mg total) by mouth at bedtime. 90 tablet 0   sodium chloride flush (NS) 0.9 % SOLN 10 mLs by Intracatheter route as needed. 120 mL 5   No current facility-administered medications on file prior to visit.   Past Medical History:  Diagnosis Date   Acquired thrombophilia ([place]) 12/06/2020   Acute pancreatitis after endoscopic retrograde cholangiopancreatography (ERCP) 03/03/2021   ERCP by Dr Odie Sera in [facility] 03/03/2021   Chest pain 02/09/2021   Chronic obstructive pulmonary disease ([place]) 07/16/2018   Colon polyp 10/25/2020   serrated adenoma.   Coronary artery disease due to lipid rich plaque 01/01/2021   Coronary artery disease with stable angina pectoris [facility])    AMI No heart cath, treated medically [facility]   Diverticulosis of colon 10/25/2020   Noted on colnoscopy by Dr Vicente Males in [place].   DNR (do not resuscitate) 03/03/2019   Hyperlipemia    Hypertension  Hypertensive heart disease without congestive heart failure 12/06/2020   Morbid obesity ([place]) 12/06/2020   BMI 35 with serious comorbidities including CAD, HTN, STROKE, Hyperlipidemia.   Need for vaccination 12/15/2019   Pulmonary embolus ([place]) 03/03/2019   Pulmonary nodules/lesions, multiple 11/25/2012   9/15/2014CT Chest : small nodule RUL LUL. Stable since 03/2012 Smoking cessation advised and Rx chantix Repeat CT Chest 4 /21/15: subpleural lymph nodes, likely benign repeat one year 2016>>>no change, considered benign Arlyce Harman 11/25/2012  NORMAL   [place],  post-stroke 07/28/2019   Tobacco use disorder 11/25/2012   Past Surgical History:  Procedure Laterality Date   COLONOSCOPY WITH PROPOFOL N/A 10/25/2020   Jonathon Bellows, MD, at [facility]. 25 MM serrated adenoma polyp at ascending colon removed and site clipped.  Pan diverticulosis.   PERICARDIOCENTESIS N/A 05/01/2021   Procedure: PERICARDIOCENTESIS;  Surgeon: Martinique, Peter M, MD;  Location: [place] [facility];  Service: Cardiovascular;  Laterality: N/A;   TONSILLECTOMY      Family History  Problem Relation Age of Onset   Hypertension Father    Heart attack Father    Stroke Father    Prostate cancer Father    Hypertension Sister    Hypertension Brother    Heart attack Brother    Lung cancer Sister    Social History   Socioeconomic History   Marital status: Married    Spouse name: Not on file   Number of children: Not on file   Years of education: Not on file   Highest education level: Not on file  Occupational History   Not on file  Tobacco Use   Smoking status: Former    Packs/day: 0.75    Years: 30.00    Pack years: 22.50    Types: Cigarettes    Start date: 03/14/1979    Quit date: 11/2020    Years since quitting: 0.5   Smokeless tobacco: Never   Tobacco comments:    Will start chantix.   Vaping Use   Vaping Use: Former   Start date: 07/28/2018  Substance and Sexual Activity   Alcohol use: No   Drug use: No   Sexual activity: Not on file  Other Topics Concern   Not on file  Social History Narrative   Not on file   Social Determinants of Health   Financial Resource Strain: Medium Risk   Difficulty of Paying Living Expenses: Somewhat hard  Food Insecurity: Not on file  Transportation Needs: No Transportation Needs   Lack of Transportation (Medical): No   Lack of Transportation (Non-Medical): No  Physical Activity: Not on file  Stress: Not on file  Social Connections: Not on file    Review of Systems  Constitutional:  Negative for appetite change, fatigue and  fever.  HENT:  Negative for congestion, ear pain, sinus pressure and sore throat.   Eyes:  Negative for pain.  Respiratory:  Negative for cough, chest tightness, shortness of breath and wheezing.   Cardiovascular:  Negative for chest pain and palpitations.  Gastrointestinal:  Negative for abdominal pain, constipation, diarrhea, nausea and vomiting.  Genitourinary:  Negative for dysuria and hematuria.  Musculoskeletal:  Negative for arthralgias, back pain, joint swelling and myalgias.  Skin:  Negative for rash.  Neurological:  Negative for dizziness, weakness and headaches.  Psychiatric/Behavioral:  Negative for dysphoric mood. The patient is not nervous/anxious.     Objective:  BP 112/68 (BP Location: Right Arm, Patient Position: Sitting)    Pulse 78    Temp 97.6 F (  36.4 C) (Temporal)    Ht '5\' 4"'$  (1.626 m)    Wt 175 lb (79.4 kg)    SpO2 98%    BMI 30.04 kg/m   BP/Weight 05/20/2021 05/10/2021 10/05/2033  Systolic BP 597 416 -  Diastolic BP 68 90 -  Wt. (Lbs) 175 - 174.6  BMI 30.04 - -    Physical Exam Vitals reviewed.  Constitutional:      Appearance: Normal appearance. He is normal weight.  Cardiovascular:     Rate and Rhythm: Normal rate and regular rhythm.     Pulses: Normal pulses.     Heart sounds: Normal heart sounds.  Pulmonary:     Breath sounds: Normal breath sounds.  Abdominal:     General: Abdomen is flat. Bowel sounds are normal.     Palpations: Abdomen is soft.  Neurological:     Mental Status: He is alert and oriented to person, place, and time.  Psychiatric:        Mood and Affect: Mood normal.        Behavior: Behavior normal.    Diabetic Foot Exam - Simple   No data filed      Lab Results  Component Value Date   WBC 12.3 (H) 05/09/2021   HGB 10.0 (L) 05/09/2021   HCT 29.7 (L) 05/09/2021   PLT 431 (H) 05/09/2021   GLUCOSE 90 05/09/2021   CHOL 99 (L) 02/21/2021   TRIG 121 02/21/2021   HDL 30 (L) 02/21/2021   LDLCALC 47 02/21/2021   ALT 17  04/18/2021   AST 24 04/18/2021   NA 134 (L) 05/09/2021   K 3.9 05/09/2021   CL 103 05/09/2021   CREATININE 0.89 05/09/2021   BUN 19 05/09/2021   CO2 22 05/09/2021   TSH 1.870 11/25/2020   INR 1.5 (H) 04/17/2021      Assessment & Plan:   Problem List Items Addressed This Visit       Cardiovascular and Mediastinum   Hypertensive heart disease without congestive heart failure   Other Visit Diagnoses     History of CVA (cerebrovascular accident)    -  Primary   History of blood clotting disorder       Coronary artery disease due to lipid rich plaque         .  No orders of the defined types were placed in this encounter.   No orders of the defined types were placed in this encounter.    Follow-up: No follow-ups on file.  An After Visit Summary was printed and given to the patient.   I,Rexanne Inocencio M Naethan Bracewell,acting as a scribe for [name], MD.,have documented all relevant documentation on the behalf of [name], MD,as directed by  [name], MD while in the presence of [name], MD.    [name], MD [place] [phone]

## 2021-05-21 LAB — COMPREHENSIVE METABOLIC PANEL
ALT: 21 IU/L (ref 0–44)
AST: 13 IU/L (ref 0–40)
Albumin/Globulin Ratio: 1.1 — ABNORMAL LOW (ref 1.2–2.2)
Albumin: 3.7 g/dL — ABNORMAL LOW (ref 3.8–4.9)
Alkaline Phosphatase: 148 IU/L — ABNORMAL HIGH (ref 44–121)
BUN/Creatinine Ratio: 20 (ref 9–20)
BUN: 26 mg/dL — ABNORMAL HIGH (ref 6–24)
Bilirubin Total: 0.3 mg/dL (ref 0.0–1.2)
CO2: 24 mmol/L (ref 20–29)
Calcium: 9.6 mg/dL (ref 8.7–10.2)
Chloride: 101 mmol/L (ref 96–106)
Creatinine, Ser: 1.28 mg/dL — ABNORMAL HIGH (ref 0.76–1.27)
Globulin, Total: 3.4 g/dL (ref 1.5–4.5)
Glucose: 82 mg/dL (ref 70–99)
Potassium: 4.3 mmol/L (ref 3.5–5.2)
Sodium: 137 mmol/L (ref 134–144)
Total Protein: 7.1 g/dL (ref 6.0–8.5)
eGFR: 65 mL/min/{1.73_m2} (ref >59)

## 2021-05-21 LAB — CBC WITH DIFFERENTIAL/PLATELET
Basophils Absolute: 0.1 10*3/uL (ref 0.0–0.2)
Basos: 1 %
EOS (ABSOLUTE): 0.1 10*3/uL (ref 0.0–0.4)
Eos: 1 %
Hematocrit: 32 % — ABNORMAL LOW (ref 37.5–51.0)
Hemoglobin: 10.6 g/dL — ABNORMAL LOW (ref 13.0–17.7)
Immature Grans (Abs): 0 10*3/uL (ref 0.0–0.1)
Immature Granulocytes: 0 %
Lymphocytes Absolute: 3.1 10*3/uL (ref 0.7–3.1)
Lymphs: 22 %
MCH: 27.4 pg (ref 26.6–33.0)
MCHC: 33.1 g/dL (ref 31.5–35.7)
MCV: 83 fL (ref 79–97)
Monocytes Absolute: 0.8 10*3/uL (ref 0.1–0.9)
Monocytes: 5 %
Neutrophils Absolute: 9.9 10*3/uL — ABNORMAL HIGH (ref 1.4–7.0)
Neutrophils: 71 %
Platelets: 349 10*3/uL (ref 150–450)
RBC: 3.87 x10E6/uL — ABNORMAL LOW (ref 4.14–5.80)
RDW: 16.1 % — ABNORMAL HIGH (ref 11.6–15.4)
WBC: 14 10*3/uL — ABNORMAL HIGH (ref 3.4–10.8)

## 2021-05-21 LAB — C-REACTIVE PROTEIN: CRP: 66 mg/L — ABNORMAL HIGH (ref 0–10)

## 2021-05-21 LAB — MAGNESIUM: Magnesium: 1.5 mg/dL — ABNORMAL LOW (ref 1.6–2.3)

## 2021-05-22 NOTE — Progress Notes (Signed)
Blood count abnormal. Wbc increased. Hb improved. Repeat cbc this week. ?Liver function normal.  ?Kidney function abnormal.  ?Recommend repeat CMP this coming week.  ?CRP elevated. Increased significantly.  ?Magnesium little low. Recommend magnesium 400 mg once daily.  ?Sending results to her surgeon.

## 2021-05-25 ENCOUNTER — Inpatient Hospital Stay (HOSPITAL_COMMUNITY): Admission: RE | Admit: 2021-05-25 | Payer: Medicare HMO | Source: Ambulatory Visit

## 2021-05-25 ENCOUNTER — Ambulatory Visit
Admission: RE | Admit: 2021-05-25 | Discharge: 2021-05-25 | Disposition: A | Discharge status: Home / Self Care | Payer: Medicare HMO | Source: Ambulatory Visit | Attending: Surgery | Admitting: Surgery

## 2021-05-25 ENCOUNTER — Encounter (HOSPITAL_COMMUNITY): Payer: Self-pay

## 2021-05-25 ENCOUNTER — Ambulatory Visit
Admission: RE | Admit: 2021-05-25 | Discharge: 2021-05-25 | Disposition: A | Discharge status: Home / Self Care | Payer: Medicare HMO | Source: Ambulatory Visit | Attending: Radiology | Admitting: Radiology

## 2021-05-25 DIAGNOSIS — D72829 Elevated white blood cell count, unspecified: Secondary | ICD-10-CM | POA: Diagnosis not present

## 2021-05-25 DIAGNOSIS — L98499 Non-pressure chronic ulcer of skin of other sites with unspecified severity: Secondary | ICD-10-CM | POA: Diagnosis not present

## 2021-05-25 DIAGNOSIS — K8689 Other specified diseases of pancreas: Secondary | ICD-10-CM | POA: Diagnosis not present

## 2021-05-25 DIAGNOSIS — K859 Acute pancreatitis without necrosis or infection, unspecified: Secondary | ICD-10-CM

## 2021-05-25 DIAGNOSIS — Z4682 Encounter for fitting and adjustment of non-vascular catheter: Secondary | ICD-10-CM | POA: Diagnosis not present

## 2021-05-25 DIAGNOSIS — K8591 Acute pancreatitis with uninfected necrosis, unspecified: Secondary | ICD-10-CM | POA: Diagnosis not present

## 2021-05-25 DIAGNOSIS — I700 Atherosclerosis of aorta: Secondary | ICD-10-CM | POA: Diagnosis not present

## 2021-05-25 HISTORY — PX: IR RADIOLOGIST EVAL & MGMT: IMG5224

## 2021-05-25 MED ORDER — IOPAMIDOL (ISOVUE-300) INJECTION 61%
100.0000 mL | Freq: Once | INTRAVENOUS | Status: AC | PRN
  Administered 2021-05-25: 100 mL via INTRAVENOUS

## 2021-05-25 NOTE — Progress Notes (Signed)
? ? ?  Referring Physician(s): Michaelle Birks, MD ? ?Reason for initial consultation: Pancreatic pseudocyst percutaneous drain placement on 04/18/21 ? ?Subjective: ?Reports approximately 1 tablespoon of opaque, light tan and blood tinged fluid per day. Has not been flushing drain. No new abdominal pain, fevers, chills.  He finished his antibiotic course on 05/24/21. ? ? ?Objective: ? ?Vital Signs: ?There were no vitals taken for this visit. ? ?Physical Exam ?Constitutional:   ?   General: He is not in acute distress. ?HENT:  ?   Head: Normocephalic.  ?   Mouth/Throat:  ?   Mouth: Mucous membranes are moist.  ?Eyes:  ?   General: No scleral icterus. ?Cardiovascular:  ?   Heart sounds: Normal heart sounds.  ?Pulmonary:  ?   Breath sounds: Normal breath sounds.  ?Abdominal:  ?   General: There is no distension.  ?   Comments: RLQ 12 Fr drain in place, dressing clean, dry, and intact.  Trace opaque, milky fluid in drainage bag.  ?Neurological:  ?   Mental Status: He is alert.  ? ? ?Imaging: ?CT AP today: ?Slightly decreased size of peripancreatic fluid collection and sequestered RLQ fluid pocket with indwelling drain in unchanged position.  No evidence of colonic fistula.   ? ?Drain injection today: ?No evidence of enteric fistulization or communication of sequestered RLQ fluid collection to main peri-pancreatic fluid collection. ? ?Labs: ?CBC ?   ?Component Value Date/Time  ? WBC 14.0 (H) 05/20/2021 1212  ? WBC 12.3 (H) 05/09/2021 0159  ? RBC 3.87 (L) 05/20/2021 1212  ? RBC 3.54 (L) 05/09/2021 0159  ? HGB 10.6 (L) 05/20/2021 1212  ? HCT 32.0 (L) 05/20/2021 1212  ? PLT 349 05/20/2021 1212  ? MCV 83 05/20/2021 1212  ? MCH 27.4 05/20/2021 1212  ? MCH 28.2 05/09/2021 0159  ? MCHC 33.1 05/20/2021 1212  ? MCHC 33.7 05/09/2021 0159  ? RDW 16.1 (H) 05/20/2021 1212  ? LYMPHSABS 3.1 05/20/2021 1212  ? MONOABS 1.1 (H) 05/09/2021 0159  ? EOSABS 0.1 05/20/2021 1212  ? BASOSABS 0.1 05/20/2021 1212  ? ? ? ? ?Assessment and Plan: ?56 year  old male with history of pancreatitis complicated by walled off necrosis status post percutaneous drain placement into RLQ fluid collection on 04/18/21.  Continued trace output from sequestered component of RLQ fluid collection.  CT with rectal contrast today shows no evidence of colonic fistula as questioned on most recent CT.  Drain injection today showed contrast limited to small sequestered RLQ collection.   ? ?Drain left in place for now given trace but persistent opaque output and recent mild leukocytosis.  He plans to follow up with Dr. Zenia Resides this Friday.  If Dr. Zenia Resides wishes to remove the drain at that time, no objection from IR standpoint.  If not, we can see the patient back in drain clinic in 2-3 weeks with repeat CT (IV contrast only). ? ? ?Electronically Signed: ?Suzette Battiest, MD ?05/25/2021, 1:37 PM ? ? ?I spent a total of 35 Minutes in face to face clinical consultation, greater than 50% of which was counseling/coordinating care for RLQ drain management. ? ?

## 2021-05-26 ENCOUNTER — Other Ambulatory Visit: Payer: Self-pay

## 2021-05-26 ENCOUNTER — Ambulatory Visit: Payer: Medicare HMO

## 2021-05-26 DIAGNOSIS — K861 Other chronic pancreatitis: Secondary | ICD-10-CM | POA: Diagnosis not present

## 2021-05-27 ENCOUNTER — Other Ambulatory Visit: Payer: Self-pay

## 2021-05-27 DIAGNOSIS — K859 Acute pancreatitis without necrosis or infection, unspecified: Secondary | ICD-10-CM | POA: Diagnosis not present

## 2021-05-27 DIAGNOSIS — K8591 Acute pancreatitis with uninfected necrosis, unspecified: Secondary | ICD-10-CM | POA: Diagnosis not present

## 2021-05-27 DIAGNOSIS — K9189 Other postprocedural complications and disorders of digestive system: Secondary | ICD-10-CM | POA: Diagnosis not present

## 2021-05-27 LAB — COMPREHENSIVE METABOLIC PANEL
ALT: 11 IU/L (ref 0–44)
AST: 8 IU/L (ref 0–40)
Albumin/Globulin Ratio: 1.2 (ref 1.2–2.2)
Albumin: 3.7 g/dL — ABNORMAL LOW (ref 3.8–4.9)
Alkaline Phosphatase: 128 IU/L — ABNORMAL HIGH (ref 44–121)
BUN/Creatinine Ratio: 20 (ref 9–20)
BUN: 29 mg/dL — ABNORMAL HIGH (ref 6–24)
Bilirubin Total: 0.2 mg/dL (ref 0.0–1.2)
CO2: 23 mmol/L (ref 20–29)
Calcium: 9.1 mg/dL (ref 8.7–10.2)
Chloride: 103 mmol/L (ref 96–106)
Creatinine, Ser: 1.44 mg/dL — ABNORMAL HIGH (ref 0.76–1.27)
Globulin, Total: 3.2 g/dL (ref 1.5–4.5)
Glucose: 93 mg/dL (ref 70–99)
Potassium: 3.1 mmol/L — ABNORMAL LOW (ref 3.5–5.2)
Sodium: 140 mmol/L (ref 134–144)
Total Protein: 6.9 g/dL (ref 6.0–8.5)
eGFR: 57 mL/min/{1.73_m2} — ABNORMAL LOW (ref >59)

## 2021-05-27 MED ORDER — POTASSIUM CHLORIDE CRYS ER 20 MEQ PO TBCR: 20.0000 meq | EXTENDED_RELEASE_TABLET | Freq: Every day | ORAL | 3 refills | Status: DC

## 2021-05-27 NOTE — Telephone Encounter (Signed)
Refill sent to pharmacy.   

## 2021-05-31 ENCOUNTER — Inpatient Hospital Stay: Payer: Medicare HMO | Admitting: Internal Medicine

## 2021-06-06 ENCOUNTER — Telehealth: Payer: Self-pay

## 2021-06-06 NOTE — Chronic Care Management (AMB) (Signed)
? ? ?  Chronic Care Management ?Pharmacy Assistant  ? ?Name: Scott Foley  MRN: [id number] DOB: [birth year] ? ?Reason for Encounter: Repatha PAP ?  ? ?Medications: ?Outpatient Encounter Medications as of 06/06/2021  ?Medication Sig  ? albuterol (VENTOLIN HFA) 108 (90 Base) MCG/ACT inhaler Inhale 2 puffs into the lungs every 6 (six) hours as needed for wheezing or shortness of breath.  ? apixaban (ELIQUIS) 5 MG TABS tablet Take 1 tablet (5 mg total) by mouth 2 (two) times daily.  ? clopidogrel (PLAVIX) 75 MG tablet Take 1 tablet (75 mg total) by mouth daily.  ? cyanocobalamin 1000 MCG tablet Take 1 tablet (1,000 mcg total) by mouth daily.  ? Evolocumab (REPATHA) 140 MG/ML SOSY Inject 140 mg into the skin every 14 (fourteen) days.  ? feeding supplement (ENSURE ENLIVE / ENSURE PLUS) LIQD Take 237 mLs by mouth 2 (two) times daily between meals.  ? Fluticasone-Umeclidin-Vilant (TRELEGY ELLIPTA) 100-62.5-25 MCG/ACT AEPB Inhale 1 puff into the lungs daily.  ? metoprolol tartrate (LOPRESSOR) 50 MG tablet Take 1 tablet (50 mg total) by mouth 2 (two) times daily.  ? Multiple Vitamin (MULTIVITAMIN WITH MINERALS) TABS tablet Take 1 tablet by mouth daily.  ? Omega-3 Fatty Acids (FISH OIL) 1000 MG CAPS Take 1 capsule (1,000 mg total) by mouth 2 (two) times daily.  ? pantoprazole (PROTONIX) 40 MG tablet Take 1 tablet (40 mg total) by mouth 2 (two) times daily.  ? potassium chloride SA (KLOR-CON M) 20 MEQ tablet Take 1 tablet (20 mEq total) by mouth daily.  ? rosuvastatin (CRESTOR) 40 MG tablet Take 1 tablet (40 mg total) by mouth at bedtime.  ? sodium chloride flush (NS) 0.9 % SOLN 10 mLs by Intracatheter route as needed.  ? ?No facility-administered encounter medications on file as of 06/06/2021.  ? ?[id number]: Completed application for Repatha to mail.  ? ?Jeannette How CMA ?Clinical Pharmacist Assistant ?[phone] ? ?

## 2021-06-07 ENCOUNTER — Telehealth: Payer: Self-pay

## 2021-06-07 NOTE — Telephone Encounter (Signed)
PA submitted and approved via covermymeds for repatha. 

## 2021-06-10 DIAGNOSIS — I10 Essential (primary) hypertension: Secondary | ICD-10-CM

## 2021-06-10 DIAGNOSIS — J449 Chronic obstructive pulmonary disease, unspecified: Secondary | ICD-10-CM | POA: Diagnosis not present

## 2021-06-10 DIAGNOSIS — E785 Hyperlipidemia, unspecified: Secondary | ICD-10-CM | POA: Diagnosis not present

## 2021-06-11 ENCOUNTER — Encounter: Payer: Self-pay | Admitting: Family Medicine

## 2021-06-11 DIAGNOSIS — Z862 Personal history of diseases of the blood and blood-forming organs and certain disorders involving the immune mechanism: Secondary | ICD-10-CM | POA: Insufficient documentation

## 2021-06-11 DIAGNOSIS — K859 Acute pancreatitis without necrosis or infection, unspecified: Secondary | ICD-10-CM | POA: Insufficient documentation

## 2021-06-11 HISTORY — DX: Personal history of diseases of the blood and blood-forming organs and certain disorders involving the immune mechanism: Z86.2

## 2021-06-11 HISTORY — DX: Hypomagnesemia: E83.42

## 2021-06-11 NOTE — Assessment & Plan Note (Signed)
Continue plavix, rosuvastatin, and metoprolol. ?

## 2021-06-11 NOTE — Assessment & Plan Note (Signed)
ON eliquis due to history of blood clotting disorder. Recurrent dvt/PE.  ?

## 2021-06-11 NOTE — Assessment & Plan Note (Signed)
Continue eliquis  ?

## 2021-06-11 NOTE — Assessment & Plan Note (Signed)
Resolved

## 2021-06-11 NOTE — Assessment & Plan Note (Signed)
Continue plavix and metoprolol. ?

## 2021-06-11 NOTE — Assessment & Plan Note (Signed)
>>  ASSESSMENT AND PLAN FOR HYPERLIPEMIA WRITTEN ON 06/11/2021  9:45 PM BY [name], [name], MD  Continue plavix , rosuvastatin , and metoprolol .

## 2021-06-11 NOTE — Assessment & Plan Note (Signed)
Continue magnesium 400 mg daily.  ?Check magnesium level.  ?

## 2021-06-11 NOTE — Assessment & Plan Note (Signed)
The current medical regimen is effective;  continue present plan and medications. ?Continue metoprolol tartrate 50 mg twice daily.  ?Continue plavix 75 mg daily.  ?Continue crestor 40 mg daily and repatha.  ?

## 2021-06-13 ENCOUNTER — Telehealth: Payer: Self-pay

## 2021-06-13 NOTE — Chronic Care Management (AMB) (Signed)
? ? ?  Chronic Care Management ?Pharmacy Assistant  ? ?Name: Scott Foley  MRN: [id number] DOB: 05-Mar-1966 ? ?Reason for Encounter: Medication Review ? ?06/13/2021- Patient called leaving a voicemail stating he is almost out of Eliquis. Called patient back, spoke with wife, she informed me she touched base with [facility] after they called and his Eliquis will be delivered to him tomorrow.  ? ?Medications: ?Outpatient Encounter Medications as of 06/13/2021  ?Medication Sig  ? albuterol (VENTOLIN HFA) 108 (90 Base) MCG/ACT inhaler Inhale 2 puffs into the lungs every 6 (six) hours as needed for wheezing or shortness of breath.  ? apixaban (ELIQUIS) 5 MG TABS tablet Take 1 tablet (5 mg total) by mouth 2 (two) times daily.  ? clopidogrel (PLAVIX) 75 MG tablet Take 1 tablet (75 mg total) by mouth daily.  ? cyanocobalamin 1000 MCG tablet Take 1 tablet (1,000 mcg total) by mouth daily.  ? Evolocumab (REPATHA) 140 MG/ML SOSY Inject 140 mg into the skin every 14 (fourteen) days.  ? Fluticasone-Umeclidin-Vilant (TRELEGY ELLIPTA) 100-62.5-25 MCG/ACT AEPB Inhale 1 puff into the lungs daily.  ? metoprolol tartrate (LOPRESSOR) 50 MG tablet Take 1 tablet (50 mg total) by mouth 2 (two) times daily.  ? Multiple Vitamin (MULTIVITAMIN WITH MINERALS) TABS tablet Take 1 tablet by mouth daily.  ? Omega-3 Fatty Acids (FISH OIL) 1000 MG CAPS Take 1 capsule (1,000 mg total) by mouth 2 (two) times daily.  ? pantoprazole (PROTONIX) 40 MG tablet Take 1 tablet (40 mg total) by mouth 2 (two) times daily.  ? potassium chloride SA (KLOR-CON M) 20 MEQ tablet Take 1 tablet (20 mEq total) by mouth daily.  ? rosuvastatin (CRESTOR) 40 MG tablet Take 1 tablet (40 mg total) by mouth at bedtime.  ? ?No facility-administered encounter medications on file as of 06/13/2021.  ? ? ?Pattricia Boss, CMA ?Clinical Pharmacist Assistant ?[phone] ? ?

## 2021-06-14 ENCOUNTER — Institutional Professional Consult (permissible substitution): Payer: Medicare HMO | Admitting: Pulmonary Disease

## 2021-06-16 ENCOUNTER — Telehealth: Payer: Self-pay

## 2021-06-16 ENCOUNTER — Inpatient Hospital Stay: Payer: Medicare HMO | Admitting: Internal Medicine

## 2021-06-16 NOTE — Progress Notes (Signed)
? ? ?  Chronic Care Management ?Pharmacy Assistant  ? ?Name: Zaccary Creech  MRN: [id number] DOB: 07-06-1965 ? ? ?Reason for Encounter: Medication Coordination for [facility]  ?  ?Recent office visits:  ?05/27/21 [name] MD. Orders only. Kidney function slightly worse. Potassium low. ?Recommend no nsaids. Recommend potassium chloride 20 meq daily.  ? ?05/20/21 [name] MD. Seen for hospital follow. No med changes.  ? ?Recent consult visits:  ?05/27/21 (surgery) Miguel Dibble MD. Seen for Acute Pancreatitis. No med changes. ? ?Hospital visits:  ?None ? ?Medications: ?Outpatient Encounter Medications as of 06/16/2021  ?Medication Sig  ? albuterol (VENTOLIN HFA) 108 (90 Base) MCG/ACT inhaler Inhale 2 puffs into the lungs every 6 (six) hours as needed for wheezing or shortness of breath.  ? apixaban (ELIQUIS) 5 MG TABS tablet Take 1 tablet (5 mg total) by mouth 2 (two) times daily.  ? clopidogrel (PLAVIX) 75 MG tablet Take 1 tablet (75 mg total) by mouth daily.  ? cyanocobalamin 1000 MCG tablet Take 1 tablet (1,000 mcg total) by mouth daily.  ? Evolocumab (REPATHA) 140 MG/ML SOSY Inject 140 mg into the skin every 14 (fourteen) days.  ? Fluticasone-Umeclidin-Vilant (TRELEGY ELLIPTA) 100-62.5-25 MCG/ACT AEPB Inhale 1 puff into the lungs daily.  ? metoprolol tartrate (LOPRESSOR) 50 MG tablet Take 1 tablet (50 mg total) by mouth 2 (two) times daily.  ? Multiple Vitamin (MULTIVITAMIN WITH MINERALS) TABS tablet Take 1 tablet by mouth daily.  ? Omega-3 Fatty Acids (FISH OIL) 1000 MG CAPS Take 1 capsule (1,000 mg total) by mouth 2 (two) times daily.  ? pantoprazole (PROTONIX) 40 MG tablet Take 1 tablet (40 mg total) by mouth 2 (two) times daily.  ? potassium chloride SA (KLOR-CON M) 20 MEQ tablet Take 1 tablet (20 mEq total) by mouth daily.  ? rosuvastatin (CRESTOR) 40 MG tablet Take 1 tablet (40 mg total) by mouth at bedtime.  ? ?No facility-administered encounter medications on file as of 06/16/2021.  ? ? ?Reviewed  chart for medication changes ahead of medication coordination call. ? ?No hospital visits since last care coordination call/Pharmacist visit. ? ? ?BP Readings from Last 3 Encounters:  ?05/25/21 107/69  ?05/20/21 112/68  ?05/10/21 122/90  ?  ?No results found for: HGBA1C  ? ?Patient obtains medications through Adherence Packaging  30 Days  ? ?Patient is due for his first adherence delivery on: 06/30/21. ?Called patient and reviewed medications and coordinated delivery. ? ?This delivery to include: ?Eliquis '5mg'$  1 at B and 1 EM ?Clopidogrel '75mg'$  1 B ?Vitamin B12 1056mg 1 B ?Repatha '140mg'$  Inject '140mg'$  every 14 days ?Trelegy Inhaler 1021m 1 puff once daily  ?Metoprolol Tar '50mg'$  1 B and 1 EM ?Mens Multi 1 at EM ?Fish Oil '1000mg'$  1 B and 1 EM ?Pantoprazole '40mg'$  1 B and 1 EM ?Rosuvastatin '40mg'$  1 EM ? ?Patient declined the following medications  ?Albuterol Inhaler- Pt only uses prn, does not need at this time ? ?Patient needs refills  ?None ? ?Confirmed delivery date of 06/30/21, advised patient that pharmacy will contact them the morning of delivery. ? ?DaElray McgregorCMA ?Clinical Pharmacist Assistant  ?[phone]?

## 2021-06-22 ENCOUNTER — Encounter: Payer: Self-pay | Admitting: Internal Medicine

## 2021-06-22 ENCOUNTER — Ambulatory Visit (INDEPENDENT_AMBULATORY_CARE_PROVIDER_SITE_OTHER): Payer: Medicare HMO | Admitting: Internal Medicine

## 2021-06-22 ENCOUNTER — Other Ambulatory Visit: Payer: Self-pay

## 2021-06-22 VITALS — BP 144/103 | HR 90 | Temp 98.3°F | Wt 175.0 lb

## 2021-06-22 DIAGNOSIS — K8591 Acute pancreatitis with uninfected necrosis, unspecified: Secondary | ICD-10-CM | POA: Diagnosis not present

## 2021-06-22 DIAGNOSIS — E43 Unspecified severe protein-calorie malnutrition: Secondary | ICD-10-CM

## 2021-06-22 DIAGNOSIS — Z5181 Encounter for therapeutic drug level monitoring: Secondary | ICD-10-CM

## 2021-06-24 ENCOUNTER — Encounter: Payer: Self-pay | Admitting: Internal Medicine

## 2021-06-24 DIAGNOSIS — Z5181 Encounter for therapeutic drug level monitoring: Secondary | ICD-10-CM | POA: Insufficient documentation

## 2021-06-24 HISTORY — DX: Encounter for therapeutic drug level monitoring: Z51.81

## 2021-06-24 NOTE — Progress Notes (Signed)
? ?  Subjective:  ? ? Patient ID: Scott Foley, male    DOB: 01/30/1966, 56 y.o.   MRN: [id number] ? ?HPI ?Here for hsfu for necrotizing pancreatitis ?He was hospitalized in February for post-ERCP pancreatitis and developed a drainable necrotic fluid collection requiring IR drain placement.  He was placed on oral linezolid and levaquin at discharge and in follow up with the surgery, he had the drain removed by Dr. Zenia Resides on 05/27/21.  He is here with his wife and they report no new significant issues including no fever, no chills, no new abdominal pain.  He is eating better.  ? ? ?Review of Systems  ?Constitutional:  Negative for chills and fever.  ?Gastrointestinal:  Negative for nausea.  ?Skin:  Negative for rash.  ? ?   ?Objective:  ? Physical Exam ?Eyes:  ?   General: No scleral icterus. ?Pulmonary:  ?   Effort: Pulmonary effort is normal.  ?Skin: ?   Findings: No rash.  ?Neurological:  ?   Mental Status: He is alert.  ? ?SH: no tobacco ? ? ? ?   ?Assessment & Plan:  ? ? ?

## 2021-06-24 NOTE — Assessment & Plan Note (Signed)
He seems to be eating better and weight stabilized.  I encouraged continue intake as tolerated.  ?

## 2021-06-24 NOTE — Assessment & Plan Note (Signed)
Clinically no new concerns and appears to have resolved.  No further antibiotics indicated at this time.  ? ?He can return here as needed.  ?

## 2021-06-24 NOTE — Assessment & Plan Note (Signed)
At the time of completion of the antibiotics, his platelets were wnl.  His creat was up a bit but likely multifactorial and can be monitored by his PCP as indicated.   ?

## 2021-07-03 NOTE — Progress Notes (Signed)
? ?Subjective:  ?Patient ID: Scott Foley, male    DOB: [birth year]  Age: 56 y.o. MRN: [id number] ? ?Chief Complaint  ?Patient presents with  ? Hypertension  ? Hyperlipidemia  ? ? ?HPI ?Hypertension ckd: Patient is taking Metoprolol 50 mg twice a day. ? ?Hyperlipidemia: Currently taking Fish Oil 1000 mg twice a day, rosuvastatin  40 mg daily, Repatha 140 mg every 14 days. ? ?GERD: He takes pantoprazole 40 mg twice a day. ? ?Simple Bronchitis: Taking Albuterol 108 mcg inhalesr 2 puffs every 6 hours PRN, Trelegy Ellipta 100-62.5-25 1 puff daily. ? ?CAD: Patient taking Plavix 75 mg daily. Repatha and crestor.  ? ?Recurrent DVT: eliquis 5 mg twice daily.  ? ?GERD: protonix 40 mg twice daily.  ?B12 deficiency: on b12 1000 mcg onc edaily.  ? ?Current Outpatient Medications on File Prior to Visit  ?Medication Sig Dispense Refill  ? albuterol (VENTOLIN HFA) 108 (90 Base) MCG/ACT inhaler Inhale 2 puffs into the lungs every 6 (six) hours as needed for wheezing or shortness of breath. 8 g 0  ? apixaban (ELIQUIS) 5 MG TABS tablet Take 1 tablet (5 mg total) by mouth 2 (two) times daily. 90 tablet 0  ? clopidogrel (PLAVIX) 75 MG tablet Take 1 tablet (75 mg total) by mouth daily. 90 tablet 0  ? cyanocobalamin 1000 MCG tablet Take 1 tablet (1,000 mcg total) by mouth daily. 30 tablet 1  ? Evolocumab (REPATHA) 140 MG/ML SOSY Inject 140 mg into the skin every 14 (fourteen) days. 6 mL 0  ? Fluticasone-Umeclidin-Vilant (TRELEGY ELLIPTA) 100-62.5-25 MCG/ACT AEPB Inhale 1 puff into the lungs daily. 1 each 11  ? metoprolol tartrate (LOPRESSOR) 50 MG tablet Take 1 tablet (50 mg total) by mouth 2 (two) times daily. 180 tablet 0  ? Multiple Vitamin (MULTIVITAMIN WITH MINERALS) TABS tablet Take 1 tablet by mouth daily.    ? Omega-3 Fatty Acids (FISH OIL) 1000 MG CAPS Take 1 capsule (1,000 mg total) by mouth 2 (two) times daily. 180 capsule 0  ? pantoprazole (PROTONIX) 40 MG tablet Take 1 tablet (40 mg total) by mouth 2 (two) times  daily. 60 tablet 1  ? potassium chloride SA (KLOR-CON M) 20 MEQ tablet Take 1 tablet (20 mEq total) by mouth daily. 30 tablet 3  ? rosuvastatin (CRESTOR) 40 MG tablet Take 1 tablet (40 mg total) by mouth at bedtime. 90 tablet 0  ? ?No current facility-administered medications on file prior to visit.  ? ?Past Medical History:  ?Diagnosis Date  ? Acquired thrombophilia ([place]) 12/06/2020  ? Acute pancreatitis after endoscopic retrograde cholangiopancreatography (ERCP) 03/03/2021  ? ERCP by Dr Odie Sera in [place] 03/03/2021  ? Chest pain 02/09/2021  ? Chronic obstructive pulmonary disease ([place]) 07/16/2018  ? Colon polyp 10/25/2020  ? serrated adenoma.  ? Coronary artery disease due to lipid rich plaque 01/01/2021  ? Coronary artery disease with stable angina pectoris ([place])   ? AMI No heart cath, treated medically [facility]  ? Diverticulosis of colon 10/25/2020  ? Noted on colnoscopy by Dr Vicente Males in [place].  ? DNR (do not resuscitate) 03/03/2019  ? Hyperlipemia   ? Hypertension   ? Hypertensive heart disease without congestive heart failure 12/06/2020  ? Morbid obesity ([place]) 12/06/2020  ? BMI 35 with serious comorbidities including CAD, HTN, STROKE, Hyperlipidemia.  ? Need for vaccination 12/15/2019  ? Pulmonary embolus ([place]) 03/03/2019  ? Pulmonary nodules/lesions, multiple 11/25/2012  ? 9/15/2014CT Chest : small nodule RUL LUL. Stable since 03/2012 Smoking cessation  advised and Rx chantix Repeat CT Chest 4 /21/15: subpleural lymph nodes, likely benign repeat one year 2016>>>no change, considered benign Arlyce Harman 11/25/2012  NORMAL  ? Sequela, post-stroke 07/28/2019  ? Tobacco use disorder 11/25/2012  ? ?Past Surgical History:  ?Procedure Laterality Date  ? COLONOSCOPY WITH PROPOFOL N/A 10/25/2020  ? Jonathon Bellows, MD, at [facility]. 25 MM serrated adenoma polyp at ascending colon removed and site clipped.  Pan diverticulosis.  ? IR RADIOLOGIST EVAL & MGMT  05/25/2021  ? LAPAROSCOPIC CHOLECYSTECTOMY  02/2021  ? PERICARDIOCENTESIS  N/A 05/01/2021  ? Procedure: PERICARDIOCENTESIS;  Surgeon: Martinique, Peter M, MD;  Location: [facility];  Service: Cardiovascular;  Laterality: N/A;  ? TONSILLECTOMY    ?  ?Family History  ?Problem Relation Age of Onset  ? Hypertension Father   ? Heart attack Father   ? Stroke Father   ? Prostate cancer Father   ? Hypertension Sister   ? Hypertension Brother   ? Heart attack Brother   ? Lung cancer Sister   ? ?Social History  ? ?Socioeconomic History  ? Marital status: Married  ?  Spouse name: Not on file  ? Number of children: Not on file  ? Years of education: Not on file  ? Highest education level: Not on file  ?Occupational History  ? Not on file  ?Tobacco Use  ? Smoking status: Former  ?  Packs/day: 0.75  ?  Years: 30.00  ?  Pack years: 22.50  ?  Types: Cigarettes  ?  Start date: 03/14/1979  ?  Quit date: 11/2020  ?  Years since quitting: 0.6  ? Smokeless tobacco: Never  ?Vaping Use  ? Vaping Use: Former  ? Start date: 07/28/2018  ?Substance and Sexual Activity  ? Alcohol use: No  ? Drug use: No  ? Sexual activity: Yes  ?  Partners: Female  ?Other Topics Concern  ? Not on file  ?Social History Narrative  ? Not on file  ? ?Social Determinants of Health  ? ?Financial Resource Strain: Medium Risk  ? Difficulty of Paying Living Expenses: Somewhat hard  ?Food Insecurity: Not on file  ?Transportation Needs: No Transportation Needs  ? Lack of Transportation (Medical): No  ? Lack of Transportation (Non-Medical): No  ?Physical Activity: Not on file  ?Stress: Not on file  ?Social Connections: Not on file  ? ? ?Review of Systems  ?Constitutional:  Negative for chills, fatigue, fever and unexpected weight change.  ?HENT:  Negative for congestion, ear pain, sinus pain and sore throat.   ?Respiratory:  Negative for cough and shortness of breath.   ?Cardiovascular:  Negative for chest pain and palpitations.  ?Gastrointestinal:  Negative for abdominal pain, blood in stool, constipation, diarrhea, nausea and vomiting.   ?Endocrine: Negative for polydipsia.  ?Genitourinary:  Negative for dysuria.  ?Musculoskeletal:  Negative for back pain.  ?Skin:  Negative for rash.  ?Neurological:  Negative for headaches.  ?Psychiatric/Behavioral:  Negative for dysphoric mood. The patient is not nervous/anxious.   ? ? ?Objective:  ?BP 140/86   Pulse 68   Temp 98 ?F (36.7 ?C)   Resp 15   Ht '5\' 4"'$  (1.626 m)   Wt 174 lb (78.9 kg)   SpO2 98%   BMI 29.87 kg/m?  ? ? ?  07/04/2021  ?  8:39 AM 06/22/2021  ?  3:14 PM 05/25/2021  ?  1:00 PM  ?BP/Weight  ?Systolic BP 967 893 810  ?Diastolic BP 86 175 69  ?Wt. (Lbs)  174 175   ?BMI 29.87 kg/m2 30.04 kg/m2   ? ? ?Physical Exam ?Vitals reviewed.  ?Constitutional:   ?   Appearance: Normal appearance.  ?Neck:  ?   Vascular: No carotid bruit.  ?Cardiovascular:  ?   Rate and Rhythm: Normal rate and regular rhythm.  ?   Heart sounds: Normal heart sounds.  ?Pulmonary:  ?   Effort: Pulmonary effort is normal.  ?   Breath sounds: Normal breath sounds. No wheezing, rhonchi or rales.  ?Abdominal:  ?   General: Bowel sounds are normal.  ?   Palpations: Abdomen is soft.  ?   Tenderness: There is no abdominal tenderness.  ?Neurological:  ?   Mental Status: He is alert.  ?Psychiatric:     ?   Mood and Affect: Mood normal.     ?   Behavior: Behavior normal.  ? ? ?Diabetic Foot Exam - Simple   ?No data filed ?  ?  ? ?Lab Results  ?Component Value Date  ? WBC 14.0 (H) 05/20/2021  ? HGB 10.6 (L) 05/20/2021  ? HCT 32.0 (L) 05/20/2021  ? PLT 349 05/20/2021  ? GLUCOSE 93 05/26/2021  ? CHOL 99 (L) 02/21/2021  ? TRIG 121 02/21/2021  ? HDL 30 (L) 02/21/2021  ? [place] 47 02/21/2021  ? ALT 11 05/26/2021  ? AST 8 05/26/2021  ? NA 140 05/26/2021  ? K 3.1 (L) 05/26/2021  ? CL 103 05/26/2021  ? CREATININE 1.44 (H) 05/26/2021  ? BUN 29 (H) 05/26/2021  ? CO2 23 05/26/2021  ? TSH 1.870 11/25/2020  ? INR 1.5 (H) 04/17/2021  ? ? ? ? ?Assessment & Plan:  ? ?Problem List Items Addressed This Visit   ? ?  ? Cardiovascular and Mediastinum  ?  Hypertensive heart disease without congestive heart failure - Primary  ?  The current medical regimen is effective;  continue present plan and medications. ? ? ?  ?  ? Relevant Orders  ? Comprehensive metabolic panel  ?

## 2021-07-04 ENCOUNTER — Ambulatory Visit (INDEPENDENT_AMBULATORY_CARE_PROVIDER_SITE_OTHER): Payer: Medicare HMO | Admitting: Family Medicine

## 2021-07-04 ENCOUNTER — Encounter: Payer: Self-pay | Admitting: Family Medicine

## 2021-07-04 VITALS — BP 140/86 | HR 68 | Temp 98.0°F | Resp 15 | Ht 64.0 in | Wt 174.0 lb

## 2021-07-04 DIAGNOSIS — I119 Hypertensive heart disease without heart failure: Secondary | ICD-10-CM | POA: Diagnosis not present

## 2021-07-04 DIAGNOSIS — D6869 Other thrombophilia: Secondary | ICD-10-CM

## 2021-07-04 DIAGNOSIS — I2510 Atherosclerotic heart disease of native coronary artery without angina pectoris: Secondary | ICD-10-CM | POA: Diagnosis not present

## 2021-07-04 DIAGNOSIS — J410 Simple chronic bronchitis: Secondary | ICD-10-CM | POA: Diagnosis not present

## 2021-07-04 DIAGNOSIS — N1832 Chronic kidney disease, stage 3b: Secondary | ICD-10-CM | POA: Diagnosis not present

## 2021-07-04 DIAGNOSIS — I2583 Coronary atherosclerosis due to lipid rich plaque: Secondary | ICD-10-CM | POA: Diagnosis not present

## 2021-07-04 DIAGNOSIS — N182 Chronic kidney disease, stage 2 (mild): Secondary | ICD-10-CM

## 2021-07-04 DIAGNOSIS — E782 Mixed hyperlipidemia: Secondary | ICD-10-CM

## 2021-07-04 DIAGNOSIS — Z1159 Encounter for screening for other viral diseases: Secondary | ICD-10-CM

## 2021-07-04 DIAGNOSIS — E538 Deficiency of other specified B group vitamins: Secondary | ICD-10-CM | POA: Insufficient documentation

## 2021-07-04 HISTORY — DX: Deficiency of other specified B group vitamins: E53.8

## 2021-07-04 HISTORY — DX: Chronic kidney disease, stage 2 (mild): N18.2

## 2021-07-04 HISTORY — DX: Encounter for screening for other viral diseases: Z11.59

## 2021-07-04 NOTE — Assessment & Plan Note (Signed)
The current medical regimen is effective;  continue present plan and medications. Management per specialist.   

## 2021-07-04 NOTE — Assessment & Plan Note (Signed)
>>  ASSESSMENT AND PLAN FOR HYPERLIPEMIA WRITTEN ON 07/04/2021  9:43 AM BY [name], [name], MD  Await labs/testing for assessment and recommendations. Recommend continue to work on eating healthy diet and exercise. Check labs.

## 2021-07-04 NOTE — Assessment & Plan Note (Signed)
Check hep c ?

## 2021-07-04 NOTE — Assessment & Plan Note (Signed)
The current medical regimen is effective;  continue present plan and medications.  

## 2021-07-04 NOTE — Assessment & Plan Note (Signed)
Await labs/testing for assessment and recommendations. Recommend continue to work on eating healthy diet and exercise. Check labs.   

## 2021-07-04 NOTE — Assessment & Plan Note (Signed)
Check cmp 

## 2021-07-04 NOTE — Assessment & Plan Note (Signed)
Secondary to eliquis needed for recurrent dvts. Marland Kitchen  ?

## 2021-07-04 NOTE — Assessment & Plan Note (Signed)
Check b12,mma 

## 2021-07-07 LAB — COMPREHENSIVE METABOLIC PANEL
ALT: 8 IU/L (ref 0–44)
AST: 14 IU/L (ref 0–40)
Albumin/Globulin Ratio: 1.2 (ref 1.2–2.2)
Albumin: 4.1 g/dL (ref 3.8–4.9)
Alkaline Phosphatase: 110 IU/L (ref 44–121)
BUN/Creatinine Ratio: 16 (ref 9–20)
BUN: 20 mg/dL (ref 6–24)
Bilirubin Total: 0.4 mg/dL (ref 0.0–1.2)
CO2: 23 mmol/L (ref 20–29)
Calcium: 10.1 mg/dL (ref 8.7–10.2)
Chloride: 100 mmol/L (ref 96–106)
Creatinine, Ser: 1.26 mg/dL (ref 0.76–1.27)
Globulin, Total: 3.4 g/dL (ref 1.5–4.5)
Glucose: 85 mg/dL (ref 70–99)
Potassium: 4.4 mmol/L (ref 3.5–5.2)
Sodium: 138 mmol/L (ref 134–144)
Total Protein: 7.5 g/dL (ref 6.0–8.5)
eGFR: 67 mL/min/{1.73_m2} (ref >59)

## 2021-07-07 LAB — CBC WITH DIFFERENTIAL/PLATELET
Basophils Absolute: 0.1 10*3/uL (ref 0.0–0.2)
Basos: 1 %
EOS (ABSOLUTE): 0.1 10*3/uL (ref 0.0–0.4)
Eos: 1 %
Hematocrit: 34.7 % — ABNORMAL LOW (ref 37.5–51.0)
Hemoglobin: 11.3 g/dL — ABNORMAL LOW (ref 13.0–17.7)
Immature Grans (Abs): 0.1 10*3/uL (ref 0.0–0.1)
Immature Granulocytes: 1 %
Lymphocytes Absolute: 3 10*3/uL (ref 0.7–3.1)
Lymphs: 23 %
MCH: 28.1 pg (ref 26.6–33.0)
MCHC: 32.6 g/dL (ref 31.5–35.7)
MCV: 86 fL (ref 79–97)
Monocytes Absolute: 0.8 10*3/uL (ref 0.1–0.9)
Monocytes: 6 %
Neutrophils Absolute: 9 10*3/uL — ABNORMAL HIGH (ref 1.4–7.0)
Neutrophils: 68 %
Platelets: 468 10*3/uL — ABNORMAL HIGH (ref 150–450)
RBC: 4.02 x10E6/uL — ABNORMAL LOW (ref 4.14–5.80)
RDW: 17 % — ABNORMAL HIGH (ref 11.6–15.4)
WBC: 13.1 10*3/uL — ABNORMAL HIGH (ref 3.4–10.8)

## 2021-07-07 LAB — HCV AB W REFLEX TO QUANT PCR: HCV Ab: NONREACTIVE

## 2021-07-07 LAB — HCV INTERPRETATION

## 2021-07-07 LAB — VITAMIN B12: Vitamin B-12: 1129 pg/mL (ref 232–1245)

## 2021-07-07 LAB — CARDIOVASCULAR RISK ASSESSMENT

## 2021-07-07 LAB — LIPID PANEL
Chol/HDL Ratio: 3.9 ratio (ref 0.0–5.0)
Cholesterol, Total: 101 mg/dL (ref 100–199)
HDL: 26 mg/dL — ABNORMAL LOW (ref >39)
LDL Chol Calc (NIH): 50 mg/dL (ref 0–99)
Triglycerides: 143 mg/dL (ref 0–149)
VLDL Cholesterol Cal: 25 mg/dL (ref 5–40)

## 2021-07-07 LAB — METHYLMALONIC ACID, SERUM: Methylmalonic Acid: 176 nmol/L (ref 0–378)

## 2021-07-07 LAB — VITAMIN D 25 HYDROXY (VIT D DEFICIENCY, FRACTURES): Vit D, 25-Hydroxy: 48.4 ng/mL (ref 30.0–100.0)

## 2021-07-07 NOTE — Progress Notes (Signed)
Blood count abnormal. Wbc elevated. Anemia persistent but improved. Platelets slightly up.  ?Liver function normal.  ?Kidney function normal.  ?B12 improved. Mma normal. ?Thyroid function normal.  ?Vitamin D normal.  ?Hep C negative.  ?Cholesterol: at goal. ?HBA1C: ?

## 2021-07-14 ENCOUNTER — Other Ambulatory Visit: Payer: Self-pay | Admitting: Family Medicine

## 2021-07-18 ENCOUNTER — Emergency Department (HOSPITAL_COMMUNITY): Payer: Medicare HMO

## 2021-07-18 ENCOUNTER — Telehealth: Payer: Self-pay

## 2021-07-18 ENCOUNTER — Encounter (HOSPITAL_COMMUNITY): Payer: Self-pay | Admitting: Emergency Medicine

## 2021-07-18 ENCOUNTER — Inpatient Hospital Stay (HOSPITAL_COMMUNITY)
Admission: EM | Admit: 2021-07-18 | Discharge: 2021-07-23 | DRG: 372 | Disposition: A | Discharge status: Home / Self Care | Payer: Medicare HMO | Attending: Internal Medicine | Admitting: Internal Medicine

## 2021-07-18 DIAGNOSIS — B954 Other streptococcus as the cause of diseases classified elsewhere: Secondary | ICD-10-CM | POA: Diagnosis present

## 2021-07-18 DIAGNOSIS — Z823 Family history of stroke: Secondary | ICD-10-CM | POA: Diagnosis not present

## 2021-07-18 DIAGNOSIS — D649 Anemia, unspecified: Secondary | ICD-10-CM | POA: Diagnosis present

## 2021-07-18 DIAGNOSIS — Z7951 Long term (current) use of inhaled steroids: Secondary | ICD-10-CM | POA: Diagnosis not present

## 2021-07-18 DIAGNOSIS — I119 Hypertensive heart disease without heart failure: Secondary | ICD-10-CM | POA: Diagnosis present

## 2021-07-18 DIAGNOSIS — I2510 Atherosclerotic heart disease of native coronary artery without angina pectoris: Secondary | ICD-10-CM | POA: Diagnosis present

## 2021-07-18 DIAGNOSIS — I700 Atherosclerosis of aorta: Secondary | ICD-10-CM | POA: Diagnosis not present

## 2021-07-18 DIAGNOSIS — Z7902 Long term (current) use of antithrombotics/antiplatelets: Secondary | ICD-10-CM

## 2021-07-18 DIAGNOSIS — K219 Gastro-esophageal reflux disease without esophagitis: Secondary | ICD-10-CM | POA: Diagnosis present

## 2021-07-18 DIAGNOSIS — R079 Chest pain, unspecified: Secondary | ICD-10-CM | POA: Diagnosis not present

## 2021-07-18 DIAGNOSIS — D6859 Other primary thrombophilia: Secondary | ICD-10-CM | POA: Diagnosis not present

## 2021-07-18 DIAGNOSIS — I10 Essential (primary) hypertension: Secondary | ICD-10-CM | POA: Diagnosis present

## 2021-07-18 DIAGNOSIS — Z86718 Personal history of other venous thrombosis and embolism: Secondary | ICD-10-CM

## 2021-07-18 DIAGNOSIS — Z66 Do not resuscitate: Secondary | ICD-10-CM | POA: Diagnosis not present

## 2021-07-18 DIAGNOSIS — Z7901 Long term (current) use of anticoagulants: Secondary | ICD-10-CM | POA: Diagnosis not present

## 2021-07-18 DIAGNOSIS — J410 Simple chronic bronchitis: Secondary | ICD-10-CM | POA: Diagnosis not present

## 2021-07-18 DIAGNOSIS — Z880 Allergy status to penicillin: Secondary | ICD-10-CM

## 2021-07-18 DIAGNOSIS — I2583 Coronary atherosclerosis due to lipid rich plaque: Secondary | ICD-10-CM | POA: Diagnosis present

## 2021-07-18 DIAGNOSIS — K651 Peritoneal abscess: Secondary | ICD-10-CM | POA: Diagnosis not present

## 2021-07-18 DIAGNOSIS — K8591 Acute pancreatitis with uninfected necrosis, unspecified: Principal | ICD-10-CM

## 2021-07-18 DIAGNOSIS — N179 Acute kidney failure, unspecified: Secondary | ICD-10-CM | POA: Diagnosis not present

## 2021-07-18 DIAGNOSIS — E785 Hyperlipidemia, unspecified: Secondary | ICD-10-CM | POA: Diagnosis present

## 2021-07-18 DIAGNOSIS — Z86711 Personal history of pulmonary embolism: Secondary | ICD-10-CM | POA: Diagnosis not present

## 2021-07-18 DIAGNOSIS — R109 Unspecified abdominal pain: Secondary | ICD-10-CM | POA: Diagnosis not present

## 2021-07-18 DIAGNOSIS — E876 Hypokalemia: Secondary | ICD-10-CM | POA: Diagnosis present

## 2021-07-18 DIAGNOSIS — J449 Chronic obstructive pulmonary disease, unspecified: Secondary | ICD-10-CM | POA: Diagnosis present

## 2021-07-18 DIAGNOSIS — Z87891 Personal history of nicotine dependence: Secondary | ICD-10-CM

## 2021-07-18 DIAGNOSIS — K861 Other chronic pancreatitis: Secondary | ICD-10-CM | POA: Diagnosis present

## 2021-07-18 DIAGNOSIS — J9811 Atelectasis: Secondary | ICD-10-CM | POA: Diagnosis not present

## 2021-07-18 DIAGNOSIS — Z8673 Personal history of transient ischemic attack (TIA), and cerebral infarction without residual deficits: Secondary | ICD-10-CM

## 2021-07-18 DIAGNOSIS — R188 Other ascites: Secondary | ICD-10-CM | POA: Diagnosis present

## 2021-07-18 DIAGNOSIS — K6819 Other retroperitoneal abscess: Secondary | ICD-10-CM | POA: Diagnosis not present

## 2021-07-18 DIAGNOSIS — Z9049 Acquired absence of other specified parts of digestive tract: Secondary | ICD-10-CM

## 2021-07-18 DIAGNOSIS — E871 Hypo-osmolality and hyponatremia: Secondary | ICD-10-CM | POA: Diagnosis not present

## 2021-07-18 DIAGNOSIS — Z8249 Family history of ischemic heart disease and other diseases of the circulatory system: Secondary | ICD-10-CM | POA: Diagnosis not present

## 2021-07-18 DIAGNOSIS — R509 Fever, unspecified: Secondary | ICD-10-CM | POA: Diagnosis not present

## 2021-07-18 DIAGNOSIS — R1084 Generalized abdominal pain: Secondary | ICD-10-CM | POA: Diagnosis not present

## 2021-07-18 DIAGNOSIS — Z79899 Other long term (current) drug therapy: Secondary | ICD-10-CM | POA: Diagnosis not present

## 2021-07-18 DIAGNOSIS — K859 Acute pancreatitis without necrosis or infection, unspecified: Secondary | ICD-10-CM | POA: Diagnosis not present

## 2021-07-18 HISTORY — DX: Other ascites: R18.8

## 2021-07-18 LAB — COMPREHENSIVE METABOLIC PANEL
ALT: 11 U/L (ref 0–44)
AST: 11 U/L — ABNORMAL LOW (ref 15–41)
Albumin: 3.2 g/dL — ABNORMAL LOW (ref 3.5–5.0)
Alkaline Phosphatase: 67 U/L (ref 38–126)
Anion gap: 12 (ref 5–15)
BUN: 18 mg/dL (ref 6–20)
CO2: 20 mmol/L — ABNORMAL LOW (ref 22–32)
Calcium: 9 mg/dL (ref 8.9–10.3)
Chloride: 103 mmol/L (ref 98–111)
Creatinine, Ser: 1.24 mg/dL (ref 0.61–1.24)
GFR, Estimated: >60 mL/min (ref >60)
Glucose, Bld: 111 mg/dL — ABNORMAL HIGH (ref 70–99)
Potassium: 3 mmol/L — ABNORMAL LOW (ref 3.5–5.1)
Sodium: 135 mmol/L (ref 135–145)
Total Bilirubin: 0.8 mg/dL (ref 0.3–1.2)
Total Protein: 7.3 g/dL (ref 6.5–8.1)

## 2021-07-18 LAB — LIPASE, BLOOD: Lipase: 30 U/L (ref 11–51)

## 2021-07-18 LAB — CBC WITH DIFFERENTIAL/PLATELET
Abs Immature Granulocytes: 0.07 10*3/uL (ref 0.00–0.07)
Basophils Absolute: 0.1 10*3/uL (ref 0.0–0.1)
Basophils Relative: 0 %
Eosinophils Absolute: 0.1 10*3/uL (ref 0.0–0.5)
Eosinophils Relative: 0 %
HCT: 33 % — ABNORMAL LOW (ref 39.0–52.0)
Hemoglobin: 10.4 g/dL — ABNORMAL LOW (ref 13.0–17.0)
Immature Granulocytes: 0 %
Lymphocytes Relative: 18 %
Lymphs Abs: 3.2 10*3/uL (ref 0.7–4.0)
MCH: 27.6 pg (ref 26.0–34.0)
MCHC: 31.5 g/dL (ref 30.0–36.0)
MCV: 87.5 fL (ref 80.0–100.0)
Monocytes Absolute: 1.6 10*3/uL — ABNORMAL HIGH (ref 0.1–1.0)
Monocytes Relative: 9 %
Neutro Abs: 13.4 10*3/uL — ABNORMAL HIGH (ref 1.7–7.7)
Neutrophils Relative %: 73 %
Platelets: 381 10*3/uL (ref 150–400)
RBC: 3.77 MIL/uL — ABNORMAL LOW (ref 4.22–5.81)
RDW: 17.1 % — ABNORMAL HIGH (ref 11.5–15.5)
WBC: 18.3 10*3/uL — ABNORMAL HIGH (ref 4.0–10.5)
nRBC: 0 % (ref 0.0–0.2)

## 2021-07-18 LAB — URINALYSIS, ROUTINE W REFLEX MICROSCOPIC
Bacteria, UA: NONE SEEN
Bilirubin Urine: NEGATIVE
Glucose, UA: NEGATIVE mg/dL
Hgb urine dipstick: NEGATIVE
Ketones, ur: NEGATIVE mg/dL
Leukocytes,Ua: NEGATIVE
Nitrite: NEGATIVE
Protein, ur: 30 mg/dL — AB
Specific Gravity, Urine: 1.024 (ref 1.005–1.030)
pH: 5 (ref 5.0–8.0)

## 2021-07-18 LAB — MAGNESIUM: Magnesium: 1.7 mg/dL (ref 1.7–2.4)

## 2021-07-18 MED ORDER — LABETALOL HCL 5 MG/ML IV SOLN: 10.0000 mg | Freq: Four times a day (QID) | INTRAVENOUS | Status: DC | PRN

## 2021-07-18 MED ORDER — METOPROLOL TARTRATE 50 MG PO TABS
50.0000 mg | ORAL_TABLET | Freq: Two times a day (BID) | ORAL | Status: DC
  Administered 2021-07-18 – 2021-07-23 (×8): 50 mg via ORAL
  Filled 2021-07-18 (×2): qty 1
  Filled 2021-07-18 (×2): qty 2
  Filled 2021-07-18 (×5): qty 1

## 2021-07-18 MED ORDER — HYDROMORPHONE HCL 1 MG/ML IJ SOLN
0.5000 mg | INTRAMUSCULAR | Status: DC | PRN
  Administered 2021-07-19 – 2021-07-22 (×8): 0.5 mg via INTRAVENOUS
  Filled 2021-07-18 (×6): qty 0.5
  Filled 2021-07-18: qty 1
  Filled 2021-07-18 (×2): qty 0.5

## 2021-07-18 MED ORDER — CEFEPIME HCL 2 G IV SOLR
2.0000 g | Freq: Once | INTRAVENOUS | Status: AC
  Administered 2021-07-18: 2 g via INTRAVENOUS
  Filled 2021-07-18: qty 12.5

## 2021-07-18 MED ORDER — POTASSIUM CHLORIDE CRYS ER 20 MEQ PO TBCR
20.0000 meq | EXTENDED_RELEASE_TABLET | Freq: Once | ORAL | Status: AC
  Administered 2021-07-18: 20 meq via ORAL
  Filled 2021-07-18: qty 1

## 2021-07-18 MED ORDER — METRONIDAZOLE 500 MG/100ML IV SOLN
500.0000 mg | Freq: Two times a day (BID) | INTRAVENOUS | Status: DC
  Administered 2021-07-18 – 2021-07-23 (×9): 500 mg via INTRAVENOUS
  Filled 2021-07-18 (×10): qty 100

## 2021-07-18 MED ORDER — SODIUM CHLORIDE 0.9 % IV SOLN: INTRAVENOUS | Status: DC | PRN

## 2021-07-18 MED ORDER — FLUTICASONE FUROATE-VILANTEROL 100-25 MCG/ACT IN AEPB
1.0000 | INHALATION_SPRAY | Freq: Every day | RESPIRATORY_TRACT | Status: DC
  Administered 2021-07-19 – 2021-07-22 (×4): 1 via RESPIRATORY_TRACT
  Filled 2021-07-18: qty 28

## 2021-07-18 MED ORDER — SODIUM CHLORIDE 0.9 % IV SOLN: INTRAVENOUS | Status: AC

## 2021-07-18 MED ORDER — ROSUVASTATIN CALCIUM 20 MG PO TABS
40.0000 mg | ORAL_TABLET | Freq: Every day | ORAL | Status: DC
Start: 2021-07-18 — End: 2021-07-23
  Administered 2021-07-18 – 2021-07-22 (×4): 40 mg via ORAL
  Filled 2021-07-18 (×5): qty 2

## 2021-07-18 MED ORDER — ACETAMINOPHEN 650 MG RE SUPP: 650.0000 mg | Freq: Four times a day (QID) | RECTAL | Status: DC | PRN

## 2021-07-18 MED ORDER — POTASSIUM CHLORIDE CRYS ER 20 MEQ PO TBCR
20.0000 meq | EXTENDED_RELEASE_TABLET | Freq: Every day | ORAL | Status: DC
  Administered 2021-07-19 – 2021-07-23 (×4): 20 meq via ORAL
  Filled 2021-07-18 (×4): qty 1

## 2021-07-18 MED ORDER — CEFEPIME HCL 2 G IV SOLR
2.0000 g | Freq: Three times a day (TID) | INTRAVENOUS | Status: DC
Start: 2021-07-19 — End: 2021-07-22
  Administered 2021-07-19 – 2021-07-22 (×11): 2 g via INTRAVENOUS
  Filled 2021-07-18 (×11): qty 12.5

## 2021-07-18 MED ORDER — ONDANSETRON HCL 4 MG/2ML IJ SOLN
4.0000 mg | Freq: Four times a day (QID) | INTRAMUSCULAR | Status: DC | PRN
  Administered 2021-07-19 – 2021-07-21 (×2): 4 mg via INTRAVENOUS
  Filled 2021-07-18 (×2): qty 2

## 2021-07-18 MED ORDER — IOHEXOL 300 MG/ML  SOLN
100.0000 mL | Freq: Once | INTRAMUSCULAR | Status: AC | PRN
  Administered 2021-07-18: 100 mL via INTRAVENOUS

## 2021-07-18 MED ORDER — ACETAMINOPHEN 325 MG PO TABS: 650.0000 mg | ORAL_TABLET | Freq: Four times a day (QID) | ORAL | Status: DC | PRN

## 2021-07-18 MED ORDER — UMECLIDINIUM BROMIDE 62.5 MCG/ACT IN AEPB
1.0000 | INHALATION_SPRAY | Freq: Every day | RESPIRATORY_TRACT | Status: DC
  Administered 2021-07-19 – 2021-07-22 (×4): 1 via RESPIRATORY_TRACT
  Filled 2021-07-18: qty 7

## 2021-07-18 MED ORDER — PANTOPRAZOLE SODIUM 40 MG PO TBEC
40.0000 mg | DELAYED_RELEASE_TABLET | Freq: Two times a day (BID) | ORAL | Status: DC
  Administered 2021-07-18 – 2021-07-23 (×8): 40 mg via ORAL
  Filled 2021-07-18 (×9): qty 1

## 2021-07-18 MED ORDER — POTASSIUM CHLORIDE 10 MEQ/100ML IV SOLN
10.0000 meq | INTRAVENOUS | Status: AC
  Administered 2021-07-18 (×2): 10 meq via INTRAVENOUS
  Filled 2021-07-18 (×2): qty 100

## 2021-07-18 MED ORDER — ALBUTEROL SULFATE (2.5 MG/3ML) 0.083% IN NEBU: 3.0000 mL | INHALATION_SOLUTION | Freq: Four times a day (QID) | RESPIRATORY_TRACT | Status: DC | PRN

## 2021-07-18 MED ORDER — ONDANSETRON HCL 4 MG PO TABS: 4.0000 mg | ORAL_TABLET | Freq: Four times a day (QID) | ORAL | Status: DC | PRN

## 2021-07-18 NOTE — ED Triage Notes (Signed)
Pt endorses right sided abd pain for a week. Pt found to have a fever today and sent for possible pancreatitis. Pt took 2 tylenol at 4 pm. ?

## 2021-07-18 NOTE — Progress Notes (Signed)
? ? ?Chronic Care Management ?Pharmacy Assistant  ? ?Name: Scott Foley  MRN: [id number] DOB: 15-Sep-1965 ? ? ?Reason for Encounter: Medication Coordination for [place]  ?  ?Recent office visits:  ?07/04/21 Scott Brome MD. Seen for routine visit. No med changes.  ? ?Recent consult visits:  ?06/22/21 (Infectious Disease) Scott Gloss MD. Seen for hospital follow up. No med changes. ? ?Hospital visits:  ?None ? ?Medications: ?Outpatient Encounter Medications as of 07/18/2021  ?Medication Sig  ? albuterol (VENTOLIN HFA) 108 (90 Base) MCG/ACT inhaler Inhale 2 puffs into the lungs every 6 (six) hours as needed for wheezing or shortness of breath.  ? apixaban (ELIQUIS) 5 MG TABS tablet Take 1 tablet (5 mg total) by mouth 2 (two) times daily.  ? clopidogrel (PLAVIX) 75 MG tablet Take 1 tablet (75 mg total) by mouth daily.  ? Evolocumab (REPATHA) 140 MG/ML SOSY Inject 140 mg into the skin every 14 (fourteen) days.  ? Fluticasone-Umeclidin-Vilant (TRELEGY ELLIPTA) 100-62.5-25 MCG/ACT AEPB Inhale 1 puff into the lungs daily.  ? metoprolol tartrate (LOPRESSOR) 50 MG tablet Take 1 tablet (50 mg total) by mouth 2 (two) times daily.  ? Multiple Vitamin (MULTIVITAMIN WITH MINERALS) TABS tablet Take 1 tablet by mouth daily.  ? Omega-3 Fatty Acids (FISH OIL) 1000 MG CAPS Take 1 capsule (1,000 mg total) by mouth 2 (two) times daily.  ? pantoprazole (PROTONIX) 40 MG tablet TAKE ONE TABLET BY MOUTH EVERY MORNING and TAKE ONE TABLET BY MOUTH EVERY EVENING  ? potassium chloride SA (KLOR-CON M) 20 MEQ tablet Take 1 tablet (20 mEq total) by mouth daily.  ? rosuvastatin (CRESTOR) 40 MG tablet Take 1 tablet (40 mg total) by mouth at bedtime.  ? vitamin B-12 (CYANOCOBALAMIN) 1000 MCG tablet TAKE ONE TABLET BY MOUTH EVERY MORNING  ? ?No facility-administered encounter medications on file as of 07/18/2021.  ? ? ?Reviewed chart for medication changes ahead of medication coordination call. ? ?No  hospital visits since last care coordination  call/Pharmacist visit.  ? ?No medication changes indicated OR if recent visit, treatment plan here. ? ?BP Readings from Last 3 Encounters:  ?07/04/21 140/86  ?06/22/21 (!) 144/103  ?05/25/21 107/69  ?  ?No results found for: HGBA1C  ? ?Patient obtains medications through Adherence Packaging  30 Days  ? ?Last adherence delivery included:  ?Eliquis '5mg'$  1 at B and 1 EM ?Clopidogrel '75mg'$  1 B ?Vitamin B12 1063mg 1 B ?Repatha '140mg'$  Inject '140mg'$  every 14 days ?Trelegy Inhaler 104m 1 puff once daily  ?Metoprolol Tar '50mg'$  1 B and 1 EM ?Mens Multi 1 at EM ?Fish Oil '1000mg'$  1 B and 1 EM ?Pantoprazole '40mg'$  1 B and 1 EM ?Rosuvastatin '40mg'$  1 EM ? ?Patient declined (meds) last month  ?Albuterol Inhaler- Pt only uses prn, does not need at this time ? ?Patient is due for next adherence delivery on: 07/28/21. ?Called patient and reviewed medications and coordinated delivery. ? ?This delivery to include: ?Eliquis '5mg'$  1 at B and 1 EM ?Clopidogrel '75mg'$  1 B ?Vitamin B12 100079m1 B ?Repatha '140mg'$  Inject '140mg'$  every 14 days ?Trelegy Inhaler 100m20m puff once daily  ?Metoprolol Tar '50mg'$  1 B and 1 EM ?Mens Multi 1 at EM ?Fish Oil '1000mg'$  1 B and 1 EM ?Pantoprazole '40mg'$  1 B and 1 EM ?Rosuvastatin '40mg'$  1 EM ? ? ?Pt is in the hospital right now and he stated we need to postpone his delivery. He does not know when he will be out so I have messaged  the pharmacy to let them know. I told pt we will follow up when he gets out. ? ? ?[name], CMA ?Clinical Pharmacist Assistant  ?[phone]  ?

## 2021-07-18 NOTE — H&P (Signed)
?History and Physical  ? ? ?Scott Foley MBT:[id number] DOB: [birth year] DOA: 07/18/2021 ? ?PCP: [name], MD  ? ?Patient coming from: Home  ? ?Chief Complaint: abdominal pain, fever  ? ?HPI: Scott Foley is a pleasant 56 y.o. male with medical history significant for CAD, recurrent DVT and PE on Eliquis, hypertension, COPD, history of CVA, and post ERCP pancreatitis with infected necrosis status post drain placement by IR in February 2023 and subsequent removal on 05/27/2021, now presenting with abdominal pain and fever.  Patient reports 1 week of right lower quadrant abdominal pain, developed a fever to 101.8 F today, took Tylenol, and came into the ED for evaluation.  He denies nausea, vomiting, or diarrhea.  Denies chest pain or shortness of breath. ? ?ED Course: Upon arrival to the ED, patient is found to be afebrile and saturating well on room air with stable blood pressure.  Basic blood work most notable for WBC 18,300, hemoglobin 10.4, and potassium 3.0.  CT of the abdomen and pelvis demonstrates interval removal of pigtail catheter from the right lower quadrant collection which has increased in size to 6.5 cm and has tracked extending to the cecum.  Blood cultures, antibiotics, and IV potassium were ordered from the ED, and surgery was consulted by the ED PA. ? ?Review of Systems:  ?All other systems reviewed and apart from HPI, are negative. ? ?Past Medical History:  ?Diagnosis Date  ? Acquired thrombophilia ([place]) 12/06/2020  ? Acute pancreatitis after endoscopic retrograde cholangiopancreatography (ERCP) 03/03/2021  ? ERCP by Dr Odie Sera in [place] 03/03/2021  ? Chest pain 02/09/2021  ? Chronic obstructive pulmonary disease ([place]) 07/16/2018  ? Colon polyp 10/25/2020  ? serrated adenoma.  ? Coronary artery disease due to lipid rich plaque 01/01/2021  ? Coronary artery disease with stable angina pectoris ([place])   ? AMI No heart cath, treated medically [facility]  ? Diverticulosis of colon  10/25/2020  ? Noted on colnoscopy by Dr Vicente Males in [place].  ? DNR (do not resuscitate) 03/03/2019  ? Hyperlipemia   ? Hypertension   ? Hypertensive heart disease without congestive heart failure 12/06/2020  ? Morbid obesity ([place]) 12/06/2020  ? BMI 35 with serious comorbidities including CAD, HTN, STROKE, Hyperlipidemia.  ? Need for vaccination 12/15/2019  ? Pulmonary embolus ([place]) 03/03/2019  ? Pulmonary nodules/lesions, multiple 11/25/2012  ? 9/15/2014CT Chest : small nodule RUL LUL. Stable since 03/2012 Smoking cessation advised and Rx chantix Repeat CT Chest 4 /21/15: subpleural lymph nodes, likely benign repeat one year 2016>>>no change, considered benign Arlyce Harman 11/25/2012  NORMAL  ? Sequela, post-stroke 07/28/2019  ? Tobacco use disorder 11/25/2012  ? ? ?Past Surgical History:  ?Procedure Laterality Date  ? COLONOSCOPY WITH PROPOFOL N/A 10/25/2020  ? Jonathon Bellows, MD, at [facility]. 25 MM serrated adenoma polyp at ascending colon removed and site clipped.  Pan diverticulosis.  ? IR RADIOLOGIST EVAL & MGMT  05/25/2021  ? LAPAROSCOPIC CHOLECYSTECTOMY  02/2021  ? PERICARDIOCENTESIS N/A 05/01/2021  ? Procedure: PERICARDIOCENTESIS;  Surgeon: Martinique, Peter M, MD;  Location: [facility];  Service: Cardiovascular;  Laterality: N/A;  ? TONSILLECTOMY    ? ? ?Social History:  ? reports that he quit smoking about 8 months ago. His smoking use included cigarettes. He started smoking about 42 years ago. He has a 22.50 pack-year smoking history. He has never used smokeless tobacco. He reports that he does not drink alcohol and does not use drugs. ? ?Allergies  ?Allergen Reactions  ? Penicillin  G Hives  ? ? ?Family History  ?Problem Relation Age of Onset  ? Hypertension Father   ? Heart attack Father   ? Stroke Father   ? Prostate cancer Father   ? Hypertension Sister   ? Hypertension Brother   ? Heart attack Brother   ? Lung cancer Sister   ? ? ? ?Prior to Admission medications   ?Medication Sig Start Date End Date Taking?  Authorizing Provider  ?albuterol (VENTOLIN HFA) 108 (90 Base) MCG/ACT inhaler Inhale 2 puffs into the lungs every 6 (six) hours as needed for wheezing or shortness of breath. 04/04/21  Yes Rip Harbour, NP  ?apixaban (ELIQUIS) 5 MG TABS tablet Take 1 tablet (5 mg total) by mouth 2 (two) times daily. 05/19/21  Yes [name], [name], MD  ?clopidogrel (PLAVIX) 75 MG tablet Take 1 tablet (75 mg total) by mouth daily. 05/19/21  Yes [name], [name], MD  ?Evolocumab (REPATHA) 140 MG/ML SOSY Inject 140 mg into the skin every 14 (fourteen) days. 05/19/21  Yes [name], [name], MD  ?Fluticasone-Umeclidin-Vilant (TRELEGY ELLIPTA) 100-62.5-25 MCG/ACT AEPB Inhale 1 puff into the lungs daily. 05/19/21  Yes [name], [name], MD  ?metoprolol tartrate (LOPRESSOR) 50 MG tablet Take 1 tablet (50 mg total) by mouth 2 (two) times daily. 05/19/21  Yes [name], [name], MD  ?Multiple Vitamin (MULTIVITAMIN WITH MINERALS) TABS tablet Take 1 tablet by mouth daily. 05/11/21  Yes Hosie Poisson, MD  ?Omega-3 Fatty Acids (FISH OIL) 1000 MG CAPS Take 1 capsule (1,000 mg total) by mouth 2 (two) times daily. 11/30/20  Yes [name], [name], MD  ?pantoprazole (PROTONIX) 40 MG tablet TAKE ONE TABLET BY MOUTH EVERY MORNING and TAKE ONE TABLET BY MOUTH EVERY EVENING ?Patient taking differently: 40 mg 2 (two) times daily. 07/14/21  Yes [name], [name], MD  ?potassium chloride SA (KLOR-CON M) 20 MEQ tablet Take 1 tablet (20 mEq total) by mouth daily. 05/27/21  Yes [name], [name], MD  ?rosuvastatin (CRESTOR) 40 MG tablet Take 1 tablet (40 mg total) by mouth at bedtime. 05/19/21  Yes [name], [name], MD  ?vitamin B-12 (CYANOCOBALAMIN) 1000 MCG tablet TAKE ONE TABLET BY MOUTH EVERY MORNING ?Patient taking differently: Take 1,000 mcg by mouth daily. 07/14/21  Yes CoxElnita Maxwell, MD  ? ? ?Physical Exam: ?Vitals:  ? 07/18/21 1746 07/18/21 1747 07/18/21 2100  ?BP: (!) 173/108  (!) 177/108  ?Pulse: (!) 110  88  ?Resp: 16  16  ?Temp: 99.3 ?F (37.4 ?C)  97.8 ?F (36.6 ?C)  ?TempSrc:   Oral  ?SpO2: 98%  98%   ?Weight:  79 kg   ?Height:  '5\' 4"'$  (1.626 m)   ? ? ?Constitutional: NAD, calm  ?Eyes: PERTLA, lids and conjunctivae normal ?ENMT: Mucous membranes are moist. Posterior pharynx clear of any exudate or lesions.   ?Neck: supple, no masses  ?Respiratory: no wheezing, no crackles. No accessory muscle use.  ?Cardiovascular: S1 & S2 heard, regular rate and rhythm. Trace pretibial edema b/l.   ?Abdomen: soft, tender in RLQ without rebound pain or guarding. Bowel sounds active.  ?Musculoskeletal: no clubbing / cyanosis. No joint deformity upper and lower extremities.   ?Skin: no significant rashes, lesions, ulcers. Warm, dry, well-perfused. ?Neurologic: CN 2-12 grossly intact. Moving all extremities. Alert and oriented.  ?Psychiatric: Pleasant. Cooperative.  ? ? ?Labs and Imaging on Admission: I have personally reviewed following labs and imaging studies ? ?CBC: ?Recent Labs  ?Lab 07/18/21 ?1807  ?WBC 18.3*  ?NEUTROABS 13.4*  ?HGB 10.4*  ?HCT 33.0*  ?MCV 87.5  ?  PLT 381  ? ?Basic Metabolic Panel: ?Recent Labs  ?Lab 07/18/21 ?1807  ?NA 135  ?K 3.0*  ?CL 103  ?CO2 20*  ?GLUCOSE 111*  ?BUN 18  ?CREATININE 1.24  ?CALCIUM 9.0  ? ?GFR: ?Estimated Creatinine Clearance: 62.4 mL/min (by C-G formula based on SCr of 1.24 mg/dL). ?Liver Function Tests: ?Recent Labs  ?Lab 07/18/21 ?1807  ?AST 11*  ?ALT 11  ?ALKPHOS 67  ?BILITOT 0.8  ?PROT 7.3  ?ALBUMIN 3.2*  ? ?Recent Labs  ?Lab 07/18/21 ?1807  ?LIPASE 30  ? ?No results for input(s): AMMONIA in the last 168 hours. ?Coagulation Profile: ?No results for input(s): INR, PROTIME in the last 168 hours. ?Cardiac Enzymes: ?No results for input(s): CKTOTAL, CKMB, CKMBINDEX, TROPONINI in the last 168 hours. ?BNP (last 3 results) ?No results for input(s): PROBNP in the last 8760 hours. ?HbA1C: ?No results for input(s): HGBA1C in the last 72 hours. ?CBG: ?No results for input(s): GLUCAP in the last 168 hours. ?Lipid Profile: ?No results for input(s): CHOL, HDL, LDLCALC, TRIG, CHOLHDL, LDLDIRECT in  the last 72 hours. ?Thyroid Function Tests: ?No results for input(s): TSH, T4TOTAL, FREET4, T3FREE, THYROIDAB in the last 72 hours. ?Anemia Panel: ?No results for input(s): VITAMINB12, FOLATE, FERRITIN, TIBC, IRON,

## 2021-07-18 NOTE — ED Provider Notes (Signed)
?[facility] ?Provider Note ? ? ?CSN: [id number] ?Arrival date & time: 07/18/21  1731 ? ?  ? ?History ? ?Chief Complaint  ?Patient presents with  ? Abdominal Pain  ? Fever  ? ? ?Scott Foley is a 56 y.o. male with a pertinent past medical history of necrotizing pancreatitis, status post cholecystectomy, hypertension, hyperlipidemia, CAD, COPD.  Presents to the emergency department with a chief complaint of right upper quadrant abdominal pain and fever.  Patient reports that he has been dealing with right upper quadrant pain for the last week.  Pain does not radiate.  Patient has been taking Tylenol for the pain with minimal improvement.  Today patient noted that he was febrile at home.  Reports Tmax of 101.8 ?F orally.  Patient does endorse urinary frequency. ? ?Denies any nausea, vomiting, abdominal distention, blood in stool, melena, dysuria, hematuria, urinary urgency, swelling or tenderness to genitals, genital sores or lesions, chest pain, shortness of breath, cough, rhinorrhea, nasal congestion, sore throat. ? ? ? ? ?Abdominal Pain ?Associated symptoms: fever   ?Associated symptoms: no chest pain, no chills, no constipation, no cough, no diarrhea, no dysuria, no hematuria, no nausea, no shortness of breath and no vomiting   ?Fever ?Associated symptoms: no chest pain, no chills, no confusion, no cough, no diarrhea, no dysuria, no headaches, no nausea, no rash and no vomiting   ? ?  ? ?Home Medications ?Prior to Admission medications   ?Medication Sig Start Date End Date Taking? Authorizing Provider  ?albuterol (VENTOLIN HFA) 108 (90 Base) MCG/ACT inhaler Inhale 2 puffs into the lungs every 6 (six) hours as needed for wheezing or shortness of breath. 04/04/21   Rip Harbour, NP  ?apixaban (ELIQUIS) 5 MG TABS tablet Take 1 tablet (5 mg total) by mouth 2 (two) times daily. 05/19/21   CoxElnita Maxwell, MD  ?clopidogrel (PLAVIX) 75 MG tablet Take 1 tablet (75 mg total) by  mouth daily. 05/19/21   [name], Elnita Maxwell, MD  ?Evolocumab (REPATHA) 140 MG/ML SOSY Inject 140 mg into the skin every 14 (fourteen) days. 05/19/21   CoxElnita Maxwell, MD  ?Fluticasone-Umeclidin-Vilant (TRELEGY ELLIPTA) 100-62.5-25 MCG/ACT AEPB Inhale 1 puff into the lungs daily. 05/19/21   CoxElnita Maxwell, MD  ?metoprolol tartrate (LOPRESSOR) 50 MG tablet Take 1 tablet (50 mg total) by mouth 2 (two) times daily. 05/19/21   CoxElnita Maxwell, MD  ?Multiple Vitamin (MULTIVITAMIN WITH MINERALS) TABS tablet Take 1 tablet by mouth daily. 05/11/21   Hosie Poisson, MD  ?Omega-3 Fatty Acids (FISH OIL) 1000 MG CAPS Take 1 capsule (1,000 mg total) by mouth 2 (two) times daily. 11/30/20   [name], Elnita Maxwell, MD  ?pantoprazole (PROTONIX) 40 MG tablet TAKE ONE TABLET BY MOUTH EVERY MORNING and TAKE ONE TABLET BY MOUTH EVERY EVENING 07/14/21   [name], [name], MD  ?potassium chloride SA (KLOR-CON M) 20 MEQ tablet Take 1 tablet (20 mEq total) by mouth daily. 05/27/21   CoxElnita Maxwell, MD  ?rosuvastatin (CRESTOR) 40 MG tablet Take 1 tablet (40 mg total) by mouth at bedtime. 05/19/21   CoxElnita Maxwell, MD  ?vitamin B-12 (CYANOCOBALAMIN) 1000 MCG tablet TAKE ONE TABLET BY MOUTH EVERY MORNING 07/14/21   [name], MD  ?   ? ?Allergies    ?Penicillin g   ? ?Review of Systems   ?Review of Systems  ?Constitutional:  Positive for fever. Negative for chills.  ?Eyes:  Negative for visual disturbance.  ?Respiratory:  Negative for cough and shortness of breath.   ?  Cardiovascular:  Negative for chest pain.  ?Gastrointestinal:  Positive for abdominal pain. Negative for abdominal distention, anal bleeding, blood in stool, constipation, diarrhea, nausea, rectal pain and vomiting.  ?Genitourinary:  Positive for frequency. Negative for difficulty urinating, dysuria, flank pain, genital sores, hematuria, penile discharge, penile pain, penile swelling, scrotal swelling, testicular pain and urgency.  ?Musculoskeletal:  Negative for back pain and neck pain.  ?Skin:  Negative for color change and  rash.  ?Neurological:  Negative for dizziness, syncope, light-headedness and headaches.  ?Psychiatric/Behavioral:  Negative for confusion.   ? ?Physical Exam ?Updated Vital Signs ?BP (!) 173/108   Pulse (!) 110   Temp 99.3 ?F (37.4 ?C)   Resp 16   Ht '5\' 4"'$  (1.626 m)   Wt 79 kg   SpO2 98%   BMI 29.90 kg/m?  ?Physical Exam ?Vitals and nursing note reviewed.  ?Constitutional:   ?   General: He is not in acute distress. ?   Appearance: He is not ill-appearing, toxic-appearing or diaphoretic.  ?HENT:  ?   Head: Normocephalic.  ?Eyes:  ?   General: No scleral icterus.    ?   Right eye: No discharge.     ?   Left eye: No discharge.  ?Cardiovascular:  ?   Rate and Rhythm: Normal rate.  ?Pulmonary:  ?   Effort: Pulmonary effort is normal.  ?Abdominal:  ?   General: There is no distension. There are no signs of injury.  ?   Palpations: Abdomen is soft. There is no mass or pulsatile mass.  ?   Tenderness: There is abdominal tenderness in the right upper quadrant. There is no left CVA tenderness, guarding or rebound.  ?   Hernia: There is no hernia in the umbilical area or ventral area.  ?   Comments: Tenderness to right upper quadrant  ?Skin: ?   General: Skin is warm and dry.  ?Neurological:  ?   General: No focal deficit present.  ?   Mental Status: He is alert.  ?Psychiatric:     ?   Behavior: Behavior is cooperative.  ? ? ?ED Results / Procedures / Treatments   ?Labs ?(all labs ordered are listed, but only abnormal results are displayed) ?Labs Reviewed  ?COMPREHENSIVE METABOLIC PANEL - Abnormal; Notable for the following components:  ?    Result Value  ? Potassium 3.0 (*)   ? CO2 20 (*)   ? Glucose, Bld 111 (*)   ? Albumin 3.2 (*)   ? AST 11 (*)   ? All other components within normal limits  ?CBC WITH DIFFERENTIAL/PLATELET - Abnormal; Notable for the following components:  ? WBC 18.3 (*)   ? RBC 3.77 (*)   ? Hemoglobin 10.4 (*)   ? HCT 33.0 (*)   ? RDW 17.1 (*)   ? Neutro Abs 13.4 (*)   ? Monocytes Absolute 1.6 (*)    ? All other components within normal limits  ?URINE CULTURE  ?CULTURE, BLOOD (ROUTINE X 2)  ?CULTURE, BLOOD (ROUTINE X 2)  ?LIPASE, BLOOD  ?URINALYSIS, ROUTINE W REFLEX MICROSCOPIC  ?MAGNESIUM  ? ? ?EKG ?None ? ?Radiology ?DG Chest 2 View ? ?Result Date: 07/18/2021 ?CLINICAL DATA:  Concern for sepsis. Patient reports right-sided abdominal pain. Fever. EXAM: CHEST - 2 VIEW COMPARISON:  Chest radiograph 05/01/2021 FINDINGS: Decreased cardiomegaly from prior exam. Aortic tortuosity. Subsegmental atelectasis in the lung bases, left greater than right. No pleural effusion, pulmonary edema, or pneumothorax. On limited assessment, no acute osseous  abnormalities are seen. IMPRESSION: Subsegmental atelectasis in the lung bases, left greater than right. No other acute findings. Electronically Signed   By: Keith Rake M.D.   On: 07/18/2021 19:52  ? ?CT ABDOMEN PELVIS W CONTRAST ? ?Result Date: 07/18/2021 ?CLINICAL DATA:  Pancreatitis.  Abdominal pain, fever EXAM: CT ABDOMEN AND PELVIS WITH CONTRAST TECHNIQUE: Multidetector CT imaging of the abdomen and pelvis was performed using the standard protocol following bolus administration of intravenous contrast. RADIATION DOSE REDUCTION: This exam was performed according to the departmental dose-optimization program which includes automated exposure control, adjustment of the mA and/or kV according to patient size and/or use of iterative reconstruction technique. CONTRAST:  117m OMNIPAQUE IOHEXOL 300 MG/ML  SOLN COMPARISON:  05/25/2021 FINDINGS: Lower chest: Bibasilar atelectasis. Heart size within normal limits. Hepatobiliary: Prior cholecystectomy. Stable 9 mm low-density lesion at the right hepatic dome. No new focal liver abnormality. Pancreas: Pancreas is less edematous compared to the previous CT. Mild residual peripancreatic fat stranding in the region of the pancreatic head. Area of walled-off necrosis along the inferior aspect of the pancreatic body measures 2.7 x 1.8 x  2.4 cm (series 3, image 33), previously measured up to 3.0 cm. Additional walled-off collection inferior to the pancreatic head measures up to 3.8 x 1.4 x 3.8 cm (series 3, image 46). There is a small tract ex

## 2021-07-18 NOTE — ED Provider Triage Note (Signed)
Emergency Medicine Provider Triage Evaluation Note ? ?Scott Foley , a 56 y.o. male  was evaluated in triage.  Pt complains of fever and abdominal pain.  Patient has been dealing with right upper quadrant abdominal pain over the last week.  Today patient started running a fever.  Patient has been dealing with frequent episodes of pancreatitis stemming from gallbladder pancreatitis in December.   ? ?Denies any nausea, vomiting, diarrhea, constipation, blood in stool, melena, dysuria, hematuria, urinary urgency, urinary frequency, swelling or tenderness genitals. ? ?Review of Systems  ?Positive: Right upper quadrant abdominal pain, fever, ?Negative: See above ? ?Physical Exam  ?BP (!) 173/108   Pulse (!) 110   Temp 99.3 ?F (37.4 ?C)   Resp 16   Ht '5\' 4"'$  (1.626 m)   Wt 79 kg   SpO2 98%   BMI 29.90 kg/m?  ?Gen:   Awake, no distress   ?Resp:  Normal effort  ?MSK:   Moves extremities without difficulty  ?Other:  Abdomen soft, nondistended, minimal tenderness to right upper quadrant.  No guarding or rebound tenderness.  No CVA tenderness bilaterally. ? ?Medical Decision Making  ?Medically screening exam initiated at 6:02 PM.  Appropriate orders placed.  [name] was informed that the remainder of the evaluation will be completed by another provider, this initial triage assessment does not replace that evaluation, and the importance of remaining in the ED until their evaluation is complete. ? ? ?  ?Loni Beckwith, PA-C ?07/18/21 1804 ? ?

## 2021-07-18 NOTE — Progress Notes (Signed)
Pharmacy Antibiotic Note ? ?Scott Foley is a 56 y.o. male admitted on 07/18/2021 with  intra-abdominal abscess secondary to necrotizing pancreatitis .  Pharmacy has been consulted for Cefepime dosing. ? ?SCr wnl, WBC elevated  ? ?Plan: ?-Cefepime 2 gm IV Q 8 hours ?-Monitor CBC, renal fx, cultures and clinical progress ? ?Height: '5\' 4"'$  (162.6 cm) ?Weight: 79 kg (174 lb 2.6 oz) ?IBW/kg (Calculated) : 59.2 ? ?Temp (24hrs), Avg:98.6 ?F (37 ?C), Min:97.8 ?F (36.6 ?C), Max:99.3 ?F (37.4 ?C) ? ?Recent Labs  ?Lab 07/18/21 ?1807  ?WBC 18.3*  ?CREATININE 1.24  ?  ?Estimated Creatinine Clearance: 62.4 mL/min (by C-G formula based on SCr of 1.24 mg/dL).   ? ?Allergies  ?Allergen Reactions  ? Penicillin G Hives  ? ? ?Antimicrobials this admission: ?Cefepime 5/8 >>  ?Flagyl 5/8 >>  ? ?Dose adjustments this admission: ? ? ?Microbiology results: ?5/8 BCx:  ?5/8 UCx:   ? ?Thank you for allowing pharmacy to be a part of this patient?s care. ? ?Albertina Parr, PharmD., BCCCP ?Clinical Pharmacist ?Please refer to [facility] for unit-specific pharmacist  ? ?

## 2021-07-18 NOTE — Consult Note (Signed)
Reason for Consult: Right-sided abdominal pain and fever ?Referring Physician: Noberto Retort ? ?Scott Foley is an 56 y.o. male.  ?HPI: 56 year old male with multiple medical problems including necrotizing pancreatitis.  He has been followed by Dr. Zenia Resides from our practice.  He recently had a percutaneous drain placed by interventional radiology.  This was for a right lower quadrant fluid collection.  This was removed March 17.  Today he developed worsening right-sided abdominal pain and fever and came to the emergency department.  He was found to have enlargement of the previous right lower quadrant fluid collection with other peripancreatic fluid collections and I was asked to see him for surgical management. ? ?Past Medical History:  ?Diagnosis Date  ? Acquired thrombophilia ([place]) 12/06/2020  ? Acute pancreatitis after endoscopic retrograde cholangiopancreatography (ERCP) 03/03/2021  ? ERCP by Dr Odie Sera in [place] 03/03/2021  ? Chest pain 02/09/2021  ? Chronic obstructive pulmonary disease ([place]) 07/16/2018  ? Colon polyp 10/25/2020  ? serrated adenoma.  ? Coronary artery disease due to lipid rich plaque 01/01/2021  ? Coronary artery disease with stable angina pectoris ([place])   ? AMI No heart cath, treated medically [facility]  ? Diverticulosis of colon 10/25/2020  ? Noted on colnoscopy by Dr Vicente Males in [place].  ? DNR (do not resuscitate) 03/03/2019  ? Hyperlipemia   ? Hypertension   ? Hypertensive heart disease without congestive heart failure 12/06/2020  ? Morbid obesity ([place]) 12/06/2020  ? BMI 35 with serious comorbidities including CAD, HTN, STROKE, Hyperlipidemia.  ? Need for vaccination 12/15/2019  ? Pulmonary embolus ([place]) 03/03/2019  ? Pulmonary nodules/lesions, multiple 11/25/2012  ? 9/15/2014CT Chest : small nodule RUL LUL. Stable since 03/2012 Smoking cessation advised and Rx chantix Repeat CT Chest 4 /21/15: subpleural lymph nodes, likely benign repeat one year 2016>>>no change, considered benign  Arlyce Harman 11/25/2012  NORMAL  ? [place], post-stroke 07/28/2019  ? Tobacco use disorder 11/25/2012  ? ? ?Past Surgical History:  ?Procedure Laterality Date  ? COLONOSCOPY WITH PROPOFOL N/A 10/25/2020  ? Jonathon Bellows, MD, at [facility] 25 MM serrated adenoma polyp at ascending colon removed and site clipped.  Pan diverticulosis.  ? IR RADIOLOGIST EVAL & MGMT  05/25/2021  ? LAPAROSCOPIC CHOLECYSTECTOMY  02/2021  ? PERICARDIOCENTESIS N/A 05/01/2021  ? Procedure: PERICARDIOCENTESIS;  Surgeon: Martinique, Peter M, MD;  Location: [facility];  Service: Cardiovascular;  Laterality: N/A;  ? TONSILLECTOMY    ? ? ?Family History  ?Problem Relation Age of Onset  ? Hypertension Father   ? Heart attack Father   ? Stroke Father   ? Prostate cancer Father   ? Hypertension Sister   ? Hypertension Brother   ? Heart attack Brother   ? Lung cancer Sister   ? ? ?Social History:  reports that he quit smoking about 8 months ago. His smoking use included cigarettes. He started smoking about 42 years ago. He has a 22.50 pack-year smoking history. He has never used smokeless tobacco. He reports that he does not drink alcohol and does not use drugs. ? ?Allergies:  ?Allergies  ?Allergen Reactions  ? Penicillin G Hives  ? ? ?Medications: I have reviewed the patient's current medications. ? ?Results for orders placed or performed during the hospital encounter of 07/18/21 (from the past 48 hour(s))  ?Comprehensive metabolic panel     Status: Abnormal  ? Collection Time: 07/18/21  6:07 PM  ?Result Value Ref Range  ? Sodium 135 135 - 145 mmol/L  ? Potassium 3.0 (  L) 3.5 - 5.1 mmol/L  ? Chloride 103 98 - 111 mmol/L  ? CO2 20 (L) 22 - 32 mmol/L  ? Glucose, Bld 111 (H) 70 - 99 mg/dL  ?  Comment: Glucose reference range applies only to samples taken after fasting for at least 8 hours.  ? BUN 18 6 - 20 mg/dL  ? Creatinine, Ser 1.24 0.61 - 1.24 mg/dL  ? Calcium 9.0 8.9 - 10.3 mg/dL  ? Total Protein 7.3 6.5 - 8.1 g/dL  ? Albumin 3.2 (L) 3.5 - 5.0 g/dL  ? AST 11  (L) 15 - 41 U/L  ? ALT 11 0 - 44 U/L  ? Alkaline Phosphatase 67 38 - 126 U/L  ? Total Bilirubin 0.8 0.3 - 1.2 mg/dL  ? GFR, Estimated >60 >60 mL/min  ?  Comment: (NOTE) ?Calculated using the CKD-EPI Creatinine Equation (2021) ?  ? Anion gap 12 5 - 15  ?  Comment: Performed at [facility], [place] [address], [place], [place] [address]  ?CBC with Differential     Status: Abnormal  ? Collection Time: 07/18/21  6:07 PM  ?Result Value Ref Range  ? WBC 18.3 (H) 4.0 - 10.5 K/uL  ? RBC 3.77 (L) 4.22 - 5.81 MIL/uL  ? Hemoglobin 10.4 (L) 13.0 - 17.0 g/dL  ? HCT 33.0 (L) 39.0 - 52.0 %  ? MCV 87.5 80.0 - 100.0 fL  ? MCH 27.6 26.0 - 34.0 pg  ? MCHC 31.5 30.0 - 36.0 g/dL  ? RDW 17.1 (H) 11.5 - 15.5 %  ? Platelets 381 150 - 400 K/uL  ? nRBC 0.0 0.0 - 0.2 %  ? Neutrophils Relative % 73 %  ? Neutro Abs 13.4 (H) 1.7 - 7.7 K/uL  ? Lymphocytes Relative 18 %  ? Lymphs Abs 3.2 0.7 - 4.0 K/uL  ? Monocytes Relative 9 %  ? Monocytes Absolute 1.6 (H) 0.1 - 1.0 K/uL  ? Eosinophils Relative 0 %  ? Eosinophils Absolute 0.1 0.0 - 0.5 K/uL  ? Basophils Relative 0 %  ? Basophils Absolute 0.1 0.0 - 0.1 K/uL  ? Immature Granulocytes 0 %  ? Abs Immature Granulocytes 0.07 0.00 - 0.07 K/uL  ?  Comment: Performed at [facility], [place] [address], [place], [place] [address]  ?Lipase, blood     Status: None  ? Collection Time: 07/18/21  6:07 PM  ?Result Value Ref Range  ? Lipase 30 11 - 51 U/L  ?  Comment: Performed at [facility], [place] [address] [place], [place] [address]  ?Urinalysis, Routine w reflex microscopic Peripheral     Status: Abnormal  ? Collection Time: 07/18/21  9:15 PM  ?Result Value Ref Range  ? Color, Urine YELLOW YELLOW  ? APPearance HAZY (A) CLEAR  ? Specific Gravity, Urine 1.024 1.005 - 1.030  ? pH 5.0 5.0 - 8.0  ? Glucose, UA NEGATIVE NEGATIVE mg/dL  ? Hgb urine dipstick NEGATIVE NEGATIVE  ? Bilirubin Urine NEGATIVE NEGATIVE  ? Ketones, ur NEGATIVE NEGATIVE mg/dL  ? Protein, ur 30 (A) NEGATIVE mg/dL  ? Nitrite  NEGATIVE NEGATIVE  ? Leukocytes,Ua NEGATIVE NEGATIVE  ? RBC / HPF 0-5 0 - 5 RBC/hpf  ? WBC, UA 0-5 0 - 5 WBC/hpf  ? Bacteria, UA NONE SEEN NONE SEEN  ? Squamous Epithelial / LPF 0-5 0 - 5  ? Mucus PRESENT   ?  Comment: Performed at [facility], [place] [address] [place], [place] [address]  ? ? ?  DG Chest 2 View ? ?Result Date: 07/18/2021 ?CLINICAL DATA:  Concern for sepsis. Patient reports right-sided abdominal pain. Fever. EXAM: CHEST - 2 VIEW COMPARISON:  Chest radiograph 05/01/2021 FINDINGS: Decreased cardiomegaly from prior exam. Aortic tortuosity. Subsegmental atelectasis in the lung bases, left greater than right. No pleural effusion, pulmonary edema, or pneumothorax. On limited assessment, no acute osseous abnormalities are seen. IMPRESSION: Subsegmental atelectasis in the lung bases, left greater than right. No other acute findings. Electronically Signed   By: Keith Rake M.D.   On: 07/18/2021 19:52  ? ?CT ABDOMEN PELVIS W CONTRAST ? ?Result Date: 07/18/2021 ?CLINICAL DATA:  Pancreatitis.  Abdominal pain, fever EXAM: CT ABDOMEN AND PELVIS WITH CONTRAST TECHNIQUE: Multidetector CT imaging of the abdomen and pelvis was performed using the standard protocol following bolus administration of intravenous contrast. RADIATION DOSE REDUCTION: This exam was performed according to the departmental dose-optimization program which includes automated exposure control, adjustment of the mA and/or kV according to patient size and/or use of iterative reconstruction technique. CONTRAST:  167m OMNIPAQUE IOHEXOL 300 MG/ML  SOLN COMPARISON:  05/25/2021 FINDINGS: Lower chest: Bibasilar atelectasis. Heart size within normal limits. Hepatobiliary: Prior cholecystectomy. Stable 9 mm low-density lesion at the right hepatic dome. No new focal liver abnormality. Pancreas: Pancreas is less edematous compared to the previous CT. Mild residual peripancreatic fat stranding in the region of the pancreatic head. Area of walled-off  necrosis along the inferior aspect of the pancreatic body measures 2.7 x 1.8 x 2.4 cm (series 3, image 33), previously measured up to 3.0 cm. Additional walled-off collection inferior to the pancreatic head mea

## 2021-07-19 DIAGNOSIS — R188 Other ascites: Secondary | ICD-10-CM | POA: Diagnosis not present

## 2021-07-19 LAB — CBC
HCT: 29.4 % — ABNORMAL LOW (ref 39.0–52.0)
Hemoglobin: 9.6 g/dL — ABNORMAL LOW (ref 13.0–17.0)
MCH: 28.5 pg (ref 26.0–34.0)
MCHC: 32.7 g/dL (ref 30.0–36.0)
MCV: 87.2 fL (ref 80.0–100.0)
Platelets: 313 10*3/uL (ref 150–400)
RBC: 3.37 MIL/uL — ABNORMAL LOW (ref 4.22–5.81)
RDW: 17.2 % — ABNORMAL HIGH (ref 11.5–15.5)
WBC: 16.5 10*3/uL — ABNORMAL HIGH (ref 4.0–10.5)
nRBC: 0 % (ref 0.0–0.2)

## 2021-07-19 LAB — COMPREHENSIVE METABOLIC PANEL
ALT: 10 U/L (ref 0–44)
AST: 12 U/L — ABNORMAL LOW (ref 15–41)
Albumin: 2.8 g/dL — ABNORMAL LOW (ref 3.5–5.0)
Alkaline Phosphatase: 59 U/L (ref 38–126)
Anion gap: 9 (ref 5–15)
BUN: 17 mg/dL (ref 6–20)
CO2: 20 mmol/L — ABNORMAL LOW (ref 22–32)
Calcium: 8.7 mg/dL — ABNORMAL LOW (ref 8.9–10.3)
Chloride: 105 mmol/L (ref 98–111)
Creatinine, Ser: 1.18 mg/dL (ref 0.61–1.24)
GFR, Estimated: >60 mL/min (ref >60)
Glucose, Bld: 106 mg/dL — ABNORMAL HIGH (ref 70–99)
Potassium: 3.7 mmol/L (ref 3.5–5.1)
Sodium: 134 mmol/L — ABNORMAL LOW (ref 135–145)
Total Bilirubin: 1.1 mg/dL (ref 0.3–1.2)
Total Protein: 6.7 g/dL (ref 6.5–8.1)

## 2021-07-19 LAB — MAGNESIUM: Magnesium: 2 mg/dL (ref 1.7–2.4)

## 2021-07-19 MED ORDER — MAGNESIUM SULFATE IN D5W 1-5 GM/100ML-% IV SOLN
1.0000 g | Freq: Once | INTRAVENOUS | Status: AC
  Administered 2021-07-19: 1 g via INTRAVENOUS
  Filled 2021-07-19: qty 100

## 2021-07-19 NOTE — Progress Notes (Signed)
?PROGRESS NOTE ? ?Scott Foley  GEX:[id number] DOB: 05-15-1965 DOA: 07/18/2021 ?PCP: [name], MD  ? ?Brief Narrative: ? ?Patient is a 56 year old male with history of coronary artery disease, recurrent DVT/PE on Eliquis, hypertension, COPD, history of CVA, ERCP pancreatitis with infected necrosis status post drain placement by IR in February 2023 with subsequent removal who presented from home with abdominal pain, fever.  He reported 1 week history of right lower quadrant abdominal pain, fever up to 101 degrees Fahrenheit.  On presentation he was hemodynamically stable.  Lab work showed leukocytosis.  CT abdomen/pelvis showed walled off right lower quadrant collection measuring up to 6.5 cm and is increased in size from the previous study.Also showed small tract extending from this right ?lower quadrant collection to the base of the cecum raising the ?possibility of a fistulous communication.  General surgery consulted.  Started on broad-spectrum antibiotics.  Plan for IR guided drain placement . ? ?Assessment & Plan: ? ?Principal Problem: ?  Intra-abdominal fluid collection ?Active Problems: ?  History of CVA (cerebrovascular accident) ?  Chronic obstructive pulmonary disease (HCC) ?  Coronary artery disease due to lipid rich plaque ?  Essential hypertension ?  History of pulmonary embolism ?  Hypokalemia ?  Normocytic anemia ? ? ?Intra-abdominal fluid collection with possibility of fistula: History of post ERCP pancreatitis with infected necrosis with drain placement which was removed on 05/27/2021.  Now presenting with right lower quadrant pain, fever, leukocytosis.CT abdomen/pelvis showed walled off right lower quadrant collection measuring up to 6.5 cm and is increased in size from the previous study.Also showed small tract extending from this right lower quadrant collection to the base of the cecum raising the ?possibility of a fistulous communication.  General surgery consulted.  Started on  broad-spectrum antibiotics: On cefepime, Flagyl.  General surgery recommended IR guided drain placement.  We will follow-up culture report ? ?History of recurrent PE/DVT: Takes Eliquis at home, currently on hold due to pending surgical intervention ? ?Hypokalemia: Supplemented with potassium.  Continue to monitor ? ?History of coronary artery disease: No anginal symptoms, Plavix on hold that he takes at home.Continue lipitor ? ?History of COPD: Currently stable, not in exacerbation.  Continue home inhalers ? ?Normocytic anemia: Currently hemoglobin stable.  Continue to monitor ? ?  ? ? ?  ?  ? ?DVT prophylaxis:SCDs Start: 07/18/21 2208 ? ? ?  Code Status: Full Code ? ?Family Communication: None at the bedside ? ?Patient status: Inpatient ? ?Patient is from : Home ? ?Anticipated discharge to: Home ? ?Estimated DC date: After drain placement, IR/general surgery clearance, culture follow-up ? ? ?Consultants: General surgery, IR ? ?Procedures: None yet ? ?Antimicrobials:  ?Anti-infectives (From admission, onward)  ? ? Start     Dose/Rate Route Frequency Ordered Stop  ? 07/19/21 0600  ceFEPIme (MAXIPIME) 2 g in sodium chloride 0.9 % 100 mL IVPB       ? 2 g ?200 mL/hr over 30 Minutes Intravenous Every 8 hours 07/18/21 2217    ? 07/18/21 2100  ceFEPIme (MAXIPIME) 2 g in sodium chloride 0.9 % 100 mL IVPB       ? 2 g ?200 mL/hr over 30 Minutes Intravenous  Once 07/18/21 2053 07/18/21 2219  ? 07/18/21 2100  metroNIDAZOLE (FLAGYL) IVPB 500 mg       ? 500 mg ?100 mL/hr over 60 Minutes Intravenous Every 12 hours 07/18/21 2053    ? ?  ? ? ?Subjective: ?Patient seen and examined at the bedside this  morning.  Hemodynamically stable during my evaluation.  Denies any new complaints.  Denies any worsening abdominal pain, nausea or vomiting.  Appears overall comfortable. ? ?Objective: ?Vitals:  ? 07/19/21 0715 07/19/21 0730 07/19/21 0745 07/19/21 0800  ?BP: 135/88 (!) 135/94 (!) 128/96 130/78  ?Pulse: 87 86 87 86  ?Resp: 20 (!) 22 (!)  24 18  ?Temp:      ?TempSrc:      ?SpO2: 96% 97% 97% 98%  ?Weight:      ?Height:      ? ? ?Intake/Output Summary (Last 24 hours) at 07/19/2021 0809 ?Last data filed at 07/18/2021 2344 ?Gross per 24 hour  ?Intake 302.09 ml  ?Output --  ?Net 302.09 ml  ? ?Filed Weights  ? 07/18/21 1747  ?Weight: 79 kg  ? ? ?Examination: ? ?General exam: Overall comfortable, not in distress ?HEENT: PERRL ?Respiratory system:  no wheezes or crackles  ?Cardiovascular system: S1 & S2 heard, RRR.  ?Gastrointestinal system: Abdomen is nondistended, soft, tenderness on right lower quadrant on deep palpation ?Central nervous system: Alert and oriented ?Extremities: No edema, no clubbing ,no cyanosis ?Skin: No rashes, no ulcers,no icterus   ? ? ?Data Reviewed: I have personally reviewed following labs and imaging studies ? ?CBC: ?Recent Labs  ?Lab 07/18/21 ?1807 07/19/21 ?0235  ?WBC 18.3* 16.5*  ?NEUTROABS 13.4*  --   ?HGB 10.4* 9.6*  ?HCT 33.0* 29.4*  ?MCV 87.5 87.2  ?PLT 381 313  ? ?Basic Metabolic Panel: ?Recent Labs  ?Lab 07/18/21 ?1807 07/18/21 ?2122 07/19/21 ?0235  ?NA 135  --  134*  ?K 3.0*  --  3.7  ?CL 103  --  105  ?CO2 20*  --  20*  ?GLUCOSE 111*  --  106*  ?BUN 18  --  17  ?CREATININE 1.24  --  1.18  ?CALCIUM 9.0  --  8.7*  ?MG  --  1.7 2.0  ? ? ? ?No results found for this or any previous visit (from the past 240 hour(s)).  ? ?Radiology Studies: ?DG Chest 2 View ? ?Result Date: 07/18/2021 ?CLINICAL DATA:  Concern for sepsis. Patient reports right-sided abdominal pain. Fever. EXAM: CHEST - 2 VIEW COMPARISON:  Chest radiograph 05/01/2021 FINDINGS: Decreased cardiomegaly from prior exam. Aortic tortuosity. Subsegmental atelectasis in the lung bases, left greater than right. No pleural effusion, pulmonary edema, or pneumothorax. On limited assessment, no acute osseous abnormalities are seen. IMPRESSION: Subsegmental atelectasis in the lung bases, left greater than right. No other acute findings. Electronically Signed   By: Keith Rake  M.D.   On: 07/18/2021 19:52  ? ?CT ABDOMEN PELVIS W CONTRAST ? ?Result Date: 07/18/2021 ?CLINICAL DATA:  Pancreatitis.  Abdominal pain, fever EXAM: CT ABDOMEN AND PELVIS WITH CONTRAST TECHNIQUE: Multidetector CT imaging of the abdomen and pelvis was performed using the standard protocol following bolus administration of intravenous contrast. RADIATION DOSE REDUCTION: This exam was performed according to the departmental dose-optimization program which includes automated exposure control, adjustment of the mA and/or kV according to patient size and/or use of iterative reconstruction technique. CONTRAST:  126m OMNIPAQUE IOHEXOL 300 MG/ML  SOLN COMPARISON:  05/25/2021 FINDINGS: Lower chest: Bibasilar atelectasis. Heart size within normal limits. Hepatobiliary: Prior cholecystectomy. Stable 9 mm low-density lesion at the right hepatic dome. No new focal liver abnormality. Pancreas: Pancreas is less edematous compared to the previous CT. Mild residual peripancreatic fat stranding in the region of the pancreatic head. Area of walled-off necrosis along the inferior aspect of the pancreatic body measures 2.7  x 1.8 x 2.4 cm (series 3, image 33), previously measured up to 3.0 cm. Additional walled-off collection inferior to the pancreatic head measures up to 3.8 x 1.4 x 3.8 cm (series 3, image 46). There is a small tract extending to larger walled-off collection in the right lower quadrant overlying the right iliacus muscle. Interval removal of the previously seen pigtail drainage catheter from this collection. Today the collection measures approximately 6.5 x 4.3 x 4.3 cm (series 3, image 56), and is increased in size from the previous study (previously measured up to approximately 5.0 cm). Spleen: Normal in size without focal abnormality. Adrenals/Urinary Tract: Unremarkable adrenal glands. Kidneys enhance symmetrically without focal lesion, stone, or hydronephrosis. Ureters are nondilated. Urinary bladder appears  unremarkable. Stomach/Bowel: Stomach is unremarkable. No dilated loops of small bowel. Enhancing fluid track from walled-off collection in the right lower quadrant extends towards the base of the cecum raising the poss

## 2021-07-19 NOTE — Progress Notes (Signed)
Patient arrive to [facility] room 1 alert and oriented x 4 pain level 2/10. Bed in lowest position call light in reach. Will continue to monitor pt. ?

## 2021-07-19 NOTE — Progress Notes (Signed)
? ? ?   ?Subjective: ?Feeling a little better this morning.  No nausea or vomiting.  Abdominal pain is mild and well controlled currently.  Temp of 101.8 yesterday. Tmax 99.3 since admit. ? ?ROS: See above, otherwise other systems negative ? ?Objective: ?Vital signs in last 24 hours: ?Temp:  [97.8 ?F (36.6 ?C)-99.3 ?F (37.4 ?C)] 97.8 ?F (36.6 ?C) (05/08 2100) ?Pulse Rate:  [80-110] 86 (05/09 0800) ?Resp:  [16-26] 18 (05/09 0800) ?BP: (110-177)/(71-108) 130/78 (05/09 0800) ?SpO2:  [94 %-100 %] 98 % (05/09 0800) ?Weight:  [79 kg] 79 kg (05/08 1747) ?  ? ?Intake/Output from previous day: ?05/08 0701 - 05/09 0700 ?In: 302.1 [I.V.:2.1; IV Piggyback:300] ?Out: -  ?Intake/Output this shift: ?No intake/output data recorded. ? ?PE: ?Heart: regular ?Lungs: CTAB ?Abd: soft, mild diffuse tenderness more in epigastrium and RLQ, ND, +BS ? ?Lab Results:  ?Recent Labs  ?  07/18/21 ?1807 07/19/21 ?0235  ?WBC 18.3* 16.5*  ?HGB 10.4* 9.6*  ?HCT 33.0* 29.4*  ?PLT 381 313  ? ?BMET ?Recent Labs  ?  07/18/21 ?1807 07/19/21 ?0235  ?NA 135 134*  ?K 3.0* 3.7  ?CL 103 105  ?CO2 20* 20*  ?GLUCOSE 111* 106*  ?BUN 18 17  ?CREATININE 1.24 1.18  ?CALCIUM 9.0 8.7*  ? ?PT/INR ?No results for input(s): LABPROT, INR in the last 72 hours. ?CMP  ?   ?Component Value Date/Time  ? NA 134 (L) 07/19/2021 0235  ? NA 138 07/04/2021 0943  ? K 3.7 07/19/2021 0235  ? CL 105 07/19/2021 0235  ? CO2 20 (L) 07/19/2021 0235  ? GLUCOSE 106 (H) 07/19/2021 0235  ? BUN 17 07/19/2021 0235  ? BUN 20 07/04/2021 0943  ? CREATININE 1.18 07/19/2021 0235  ? CALCIUM 8.7 (L) 07/19/2021 0235  ? PROT 6.7 07/19/2021 0235  ? PROT 7.5 07/04/2021 0943  ? ALBUMIN 2.8 (L) 07/19/2021 0235  ? ALBUMIN 4.1 07/04/2021 0943  ? AST 12 (L) 07/19/2021 0235  ? ALT 10 07/19/2021 0235  ? ALKPHOS 59 07/19/2021 0235  ? BILITOT 1.1 07/19/2021 0235  ? BILITOT 0.4 07/04/2021 0943  ? GFRNONAA >60 07/19/2021 0235  ? GFRAA 105 12/15/2019 1034  ? ?Lipase  ?   ?Component Value Date/Time  ? LIPASE 30  07/18/2021 1807  ? ? ? ? ? ?Studies/Results: ?DG Chest 2 View ? ?Result Date: 07/18/2021 ?CLINICAL DATA:  Concern for sepsis. Patient reports right-sided abdominal pain. Fever. EXAM: CHEST - 2 VIEW COMPARISON:  Chest radiograph 05/01/2021 FINDINGS: Decreased cardiomegaly from prior exam. Aortic tortuosity. Subsegmental atelectasis in the lung bases, left greater than right. No pleural effusion, pulmonary edema, or pneumothorax. On limited assessment, no acute osseous abnormalities are seen. IMPRESSION: Subsegmental atelectasis in the lung bases, left greater than right. No other acute findings. Electronically Signed   By: Keith Rake M.D.   On: 07/18/2021 19:52  ? ?CT ABDOMEN PELVIS W CONTRAST ? ?Result Date: 07/18/2021 ?CLINICAL DATA:  Pancreatitis.  Abdominal pain, fever EXAM: CT ABDOMEN AND PELVIS WITH CONTRAST TECHNIQUE: Multidetector CT imaging of the abdomen and pelvis was performed using the standard protocol following bolus administration of intravenous contrast. RADIATION DOSE REDUCTION: This exam was performed according to the departmental dose-optimization program which includes automated exposure control, adjustment of the mA and/or kV according to patient size and/or use of iterative reconstruction technique. CONTRAST:  173m OMNIPAQUE IOHEXOL 300 MG/ML  SOLN COMPARISON:  05/25/2021 FINDINGS: Lower chest: Bibasilar atelectasis. Heart size within normal limits. Hepatobiliary: Prior cholecystectomy. Stable 9 mm low-density  lesion at the right hepatic dome. No new focal liver abnormality. Pancreas: Pancreas is less edematous compared to the previous CT. Mild residual peripancreatic fat stranding in the region of the pancreatic head. Area of walled-off necrosis along the inferior aspect of the pancreatic body measures 2.7 x 1.8 x 2.4 cm (series 3, image 33), previously measured up to 3.0 cm. Additional walled-off collection inferior to the pancreatic head measures up to 3.8 x 1.4 x 3.8 cm (series 3, image  46). There is a small tract extending to larger walled-off collection in the right lower quadrant overlying the right iliacus muscle. Interval removal of the previously seen pigtail drainage catheter from this collection. Today the collection measures approximately 6.5 x 4.3 x 4.3 cm (series 3, image 56), and is increased in size from the previous study (previously measured up to approximately 5.0 cm). Spleen: Normal in size without focal abnormality. Adrenals/Urinary Tract: Unremarkable adrenal glands. Kidneys enhance symmetrically without focal lesion, stone, or hydronephrosis. Ureters are nondilated. Urinary bladder appears unremarkable. Stomach/Bowel: Stomach is unremarkable. No dilated loops of small bowel. Enhancing fluid track from walled-off collection in the right lower quadrant extends towards the base of the cecum raising the possibility of a fistulous track (series 6, images 45-47). There is also wall thickening of the second and third portions of the duodenum adjacent to additional peripancreatic collection. Vascular/Lymphatic: Aortic atherosclerosis with right infrarenal penetrating atheromatous ulceration (series 3, image 38), unchanged in appearance from prior. No abdominopelvic lymphadenopathy. Reproductive: Prostate is unremarkable. Other: No pneumoperitoneum.  No abdominal wall hernia. Musculoskeletal: No acute bony findings. IMPRESSION: 1. Interval removal of the previously seen pigtail drainage catheter from the walled off right lower quadrant collection. Today the collection measures up to 6.5 cm and is increased in size from the previous study. There is a small tract extending from this right lower quadrant collection to the base of the cecum raising the possibility of a fistulous communication. 2. Additional walled-off collections along the pancreatic body and head have both slightly decreased in size compared to the prior CT. 3. Reactive wall thickening of the second and third portions of  the duodenum adjacent to additional peripancreatic collections. Aortic Atherosclerosis (ICD10-I70.0). Electronically Signed   By: Davina Poke D.O.   On: 07/18/2021 20:19   ? ?Anti-infectives: ?Anti-infectives (From admission, onward)  ? ? Start     Dose/Rate Route Frequency Ordered Stop  ? 07/19/21 0600  ceFEPIme (MAXIPIME) 2 g in sodium chloride 0.9 % 100 mL IVPB       ? 2 g ?200 mL/hr over 30 Minutes Intravenous Every 8 hours 07/18/21 2217    ? 07/18/21 2100  ceFEPIme (MAXIPIME) 2 g in sodium chloride 0.9 % 100 mL IVPB       ? 2 g ?200 mL/hr over 30 Minutes Intravenous  Once 07/18/21 2053 07/18/21 2219  ? 07/18/21 2100  metroNIDAZOLE (FLAGYL) IVPB 500 mg       ? 500 mg ?100 mL/hr over 60 Minutes Intravenous Every 12 hours 07/18/21 2053    ? ?  ? ? ? ?Assessment/Plan ?Recurrent RLQ fluid collection with other small intra-abdominal fluid collections ?Post-ERCP pancreatitis, developed into infected necrosis requiring IR drain in 2/23 ?- s/p lap chole 03/02/21 by Dr. Lilia Pro with abnormal IOC >> underwent ERCP 03/03/21 ?-patient has redeveloped another fluid collection in the RLQ.  Unclear if he has a duct that is leaking or a fistula that is causing this reformation.   ?-ask IR to plan for new drain placement  for now to allow for better determination of treatment plan moving forward ?-may have a diet after his drain placement, but if unable to do today, may eat ?-cont IV abx therapy for now, WBC down to 16 from 18K yesterday. ?-discussed with Dr. Zenia Resides and Dr. Brantley Stage. ?-hold eliquis and plavix currently for drain procedure ? ?FEN - NPO for procedure (but may not be able to do today due to anticoagulation)/IVFs ?VTE - hold eliquis and plavix, SCDs ?ID - cefepime/Flagyl ? ?COPD ?CAD ?HTN ?HLD ?H/O PE ? ?I reviewed hospitalist notes, last 24 h vitals and pain scores, last 48 h intake and output, last 24 h labs and trends, and last 24 h imaging results. ? ? LOS: 1 day  ? ? ?Henreitta Cea , PA-C ?[facility] ?07/19/2021, 8:13 AM ?Please see Amion for pager number during day hours 7:00am-4:30pm or 7:00am -11:30am on weekends ? ?

## 2021-07-19 NOTE — ED Notes (Signed)
MD into room, at [facility].  ?

## 2021-07-19 NOTE — ED Notes (Signed)
Pt alert, NAD, calm, interactive, resps e/u, speaking in clear complete sentences, VSS. Rates pain mild 2/10, denies other sx or complaints, questions or needs. ?

## 2021-07-20 DIAGNOSIS — R188 Other ascites: Secondary | ICD-10-CM | POA: Diagnosis not present

## 2021-07-20 LAB — COMPREHENSIVE METABOLIC PANEL
ALT: 10 U/L (ref 0–44)
AST: 10 U/L — ABNORMAL LOW (ref 15–41)
Albumin: 2.5 g/dL — ABNORMAL LOW (ref 3.5–5.0)
Alkaline Phosphatase: 62 U/L (ref 38–126)
Anion gap: 5 (ref 5–15)
BUN: 15 mg/dL (ref 6–20)
CO2: 23 mmol/L (ref 22–32)
Calcium: 8.6 mg/dL — ABNORMAL LOW (ref 8.9–10.3)
Chloride: 106 mmol/L (ref 98–111)
Creatinine, Ser: 1.06 mg/dL (ref 0.61–1.24)
GFR, Estimated: >60 mL/min (ref >60)
Glucose, Bld: 99 mg/dL (ref 70–99)
Potassium: 3.8 mmol/L (ref 3.5–5.1)
Sodium: 134 mmol/L — ABNORMAL LOW (ref 135–145)
Total Bilirubin: 0.4 mg/dL (ref 0.3–1.2)
Total Protein: 6.3 g/dL — ABNORMAL LOW (ref 6.5–8.1)

## 2021-07-20 LAB — CBC
HCT: 29.5 % — ABNORMAL LOW (ref 39.0–52.0)
Hemoglobin: 9.1 g/dL — ABNORMAL LOW (ref 13.0–17.0)
MCH: 27.4 pg (ref 26.0–34.0)
MCHC: 30.8 g/dL (ref 30.0–36.0)
MCV: 88.9 fL (ref 80.0–100.0)
Platelets: 283 10*3/uL (ref 150–400)
RBC: 3.32 MIL/uL — ABNORMAL LOW (ref 4.22–5.81)
RDW: 16.7 % — ABNORMAL HIGH (ref 11.5–15.5)
WBC: 10.1 10*3/uL (ref 4.0–10.5)
nRBC: 0 % (ref 0.0–0.2)

## 2021-07-20 NOTE — Progress Notes (Addendum)
?PROGRESS NOTE ? ? ? ?Scott Foley  CBS:[id number] DOB: 07/21/1965 DOA: 07/18/2021 ?PCP: [name], MD  ? ? ?Brief Narrative:  ? ?Scott Foley is a 56 year old male with past medical history significant for CAD, recurrent DVT/PE on Eliquis, HTN, COPD, history of CVA, ERCP pancreatitis with infected necrosis s/p drain placement by IR February 2023 with subsequent removal who presented to [facility] ED on 5/8 from home with complaint of abdominal pain and fever.  Reports 1 week history of right lower quadrant pain with fever up to 101.0 ?F.  On arrival to the ED, WBC count elevated 18, CT abdomen/pelvis with walled off right lower quadrant collection measuring up to 6.5 cm which is increased in size from previous study; also with a small tract extending from his right lower quadrant collection to the base of the cecum raising concern for possible fistulous communication.  General surgery was consulted.  Patient was started on broad-spectrum antibiotics.  Hospitalist service consulted for admission. ? ? ?Assessment & Plan: ?  ?Intra-abdominal fluid collection, recurrent ?Patient presenting to ED with progressive and recurrent abdominal pain localized to the right lower quadrant.  Patient with history of laparoscopic cholecystectomy December 2022 and developed post ERCP pancreatitis that developed into infected necrosis requiring IR drain placement February 2023.  Drain was subsequently removed and now CT abdomen/pelvis demonstrating recurrence of right lower quadrant collection measuring up to 6.5 cm. ?--General surgery following, appreciate assistance5 ?--WBC 18.3>16.5>10.1 ?--Blood cultures x2: No growth x2 days ?--Cefepime 2 g IV every 8 hours ?--Metronidazole 5 mg IV every 12 hours ?--Pending drain placement by IR on 5/11, n.p.o. after midnight ?--CBC daily ? ?History of recurrent PE/DVT ?--Holding home Eliquis for planned IR drain placement tomorrow ? ?Hypokalemia ?Repleted, potassium 3.8 this  morning. ?--Continue home potassium 20 mEq daily ? ?Hyponatremia ?Sodium slightly low, 134 this morning.  Likely secondary to poor oral intake. ?--BMP daily ? ?CAD ?Essential hypertension ?Stable. ?--Holding home Plavix ?--Metoprolol tartrate 50 mg p.o. twice daily ?--Continue Crestor 40 mg p.o. daily ? ?COPD ?Stable, on room air. ?--Breo Ellipta 1 puff daily ?--Incruse Ellipta 1 puff daily ?--Albuterol inhaler every 6 hours as needed shortness of breath/wheezing ? ?GERD: Protonix 40 mg p.o. twice daily ? ?Normocytic anemia ?Hemoglobin 9.1, stable. ? ? ?DVT prophylaxis: SCDs Start: 07/18/21 2208 ? ?  Code Status: Full Code ?Family Communication: Updated family at bedside this morning ? ?Disposition Plan:  ?Level of care: Med-Surg ?Status is: Inpatient ?Remains inpatient appropriate because: Pending IR guided drain placement planned for 5/11 ?  ? ?Consultants:  ?General surgery ?Interventional radiology ? ?Procedures:  ?None ? ?Antimicrobials:  ?Cefepime 5/8>> ?Metronidazole 5/8>> ? ? ?Subjective: ?Patient seen and examined at bedside, resting comfortably.  No specific complaints this morning.  Continues with mild right lower quadrant tenderness, relatively unchanged.  Pending IR drain placement, now delayed until tomorrow due to scheduling.  Discussed with general surgery this morning.  No other specific questions or concerns at this time.  Denies headache, no visual changes, no fever/chills/night sweats, no nausea/vomiting/diarrhea, no chest pain, no palpitations, no shortness of breath, no weakness, no fatigue, no paresthesias.  No acute events overnight per nurse staff. ? ?Objective: ?Vitals:  ? 07/19/21 2135 07/20/21 0553 07/20/21 0743 07/20/21 0823  ?BP: 126/79 133/69 129/86   ?Pulse: 95 94 79   ?Resp: '16 16 16   '$ ?Temp: 97.6 ?F (36.4 ?C) 98.2 ?F (36.8 ?C) 97.7 ?F (36.5 ?C)   ?TempSrc: Oral Oral Oral   ?SpO2: 95% 98%  97% 94%  ?Weight:      ?Height:      ? ? ?Intake/Output Summary (Last 24 hours) at 07/20/2021  1120 ?Last data filed at 07/19/2021 2135 ?Gross per 24 hour  ?Intake 1399.21 ml  ?Output 1190 ml  ?Net 209.21 ml  ? ?Filed Weights  ? 07/18/21 1747  ?Weight: 79 kg  ? ? ?Examination: ? ?Physical Exam: ?GEN: NAD, alert and oriented x 3,  ?HEENT: NCAT, PERRL, EOMI, sclera clear, MMM ?PULM: CTAB w/o wheezes/crackles, normal respiratory effort ?CV: RRR w/o M/G/R ?GI: abd soft, nondistended, mild tenderness to palpation in the right lower quadrant, NABS, no R/G/M ?MSK: no peripheral edema, muscle strength globally intact 5/5 bilateral upper/lower extremities ?NEURO: CN II-XII intact, no focal deficits, sensation to light touch intact ?PSYCH: normal mood/affect ?Integumentary: dry/intact, no rashes or wounds ? ? ? ?Data Reviewed: I have personally reviewed following labs and imaging studies ? ?CBC: ?Recent Labs  ?Lab 07/18/21 ?1807 07/19/21 ?0235 07/20/21 ?0145  ?WBC 18.3* 16.5* 10.1  ?NEUTROABS 13.4*  --   --   ?HGB 10.4* 9.6* 9.1*  ?HCT 33.0* 29.4* 29.5*  ?MCV 87.5 87.2 88.9  ?PLT 381 313 283  ? ?Basic Metabolic Panel: ?Recent Labs  ?Lab 07/18/21 ?1807 07/18/21 ?2122 07/19/21 ?0235 07/20/21 ?0145  ?NA 135  --  134* 134*  ?K 3.0*  --  3.7 3.8  ?CL 103  --  105 106  ?CO2 20*  --  20* 23  ?GLUCOSE 111*  --  106* 99  ?BUN 18  --  17 15  ?CREATININE 1.24  --  1.18 1.06  ?CALCIUM 9.0  --  8.7* 8.6*  ?MG  --  1.7 2.0  --   ? ?GFR: ?Estimated Creatinine Clearance: 73 mL/min (by C-G formula based on SCr of 1.06 mg/dL). ?Liver Function Tests: ?Recent Labs  ?Lab 07/18/21 ?1807 07/19/21 ?0235 07/20/21 ?0145  ?AST 11* 12* 10*  ?ALT '11 10 10  '$ ?ALKPHOS 67 59 62  ?BILITOT 0.8 1.1 0.4  ?PROT 7.3 6.7 6.3*  ?ALBUMIN 3.2* 2.8* 2.5*  ? ?Recent Labs  ?Lab 07/18/21 ?1807  ?LIPASE 30  ? ?No results for input(s): AMMONIA in the last 168 hours. ?Coagulation Profile: ?No results for input(s): INR, PROTIME in the last 168 hours. ?Cardiac Enzymes: ?No results for input(s): CKTOTAL, CKMB, CKMBINDEX, TROPONINI in the last 168 hours. ?BNP (last 3  results) ?No results for input(s): PROBNP in the last 8760 hours. ?HbA1C: ?No results for input(s): HGBA1C in the last 72 hours. ?CBG: ?No results for input(s): GLUCAP in the last 168 hours. ?Lipid Profile: ?No results for input(s): CHOL, HDL, LDLCALC, TRIG, CHOLHDL, LDLDIRECT in the last 72 hours. ?Thyroid Function Tests: ?No results for input(s): TSH, T4TOTAL, FREET4, T3FREE, THYROIDAB in the last 72 hours. ?Anemia Panel: ?No results for input(s): VITAMINB12, FOLATE, FERRITIN, TIBC, IRON, RETICCTPCT in the last 72 hours. ?Sepsis Labs: ?No results for input(s): PROCALCITON, LATICACIDVEN in the last 168 hours. ? ?Recent Results (from the past 240 hour(s))  ?Blood culture (routine x 2)     Status: None (Preliminary result)  ? Collection Time: 07/18/21  9:22 PM  ? Specimen: BLOOD  ?Result Value Ref Range Status  ? Specimen Description BLOOD LEFT ANTECUBITAL  Final  ? Special Requests   Final  ?  BOTTLES DRAWN AEROBIC AND ANAEROBIC Blood Culture adequate volume  ? Culture   Final  ?  NO GROWTH 2 DAYS ?Performed at [facility], [place] [address], [place], [place] [address] ?  ?  Report Status PENDING  Incomplete  ?Blood culture (routine x 2)     Status: None (Preliminary result)  ? Collection Time: 07/19/21  9:35 AM  ? Specimen: BLOOD  ?Result Value Ref Range Status  ? Specimen Description BLOOD RIGHT ANTECUBITAL  Final  ? Special Requests   Final  ?  BOTTLES DRAWN AEROBIC AND ANAEROBIC Blood Culture results may not be optimal due to an excessive volume of blood received in culture bottles  ? Culture   Final  ?  NO GROWTH < 24 HOURS ?Performed at [facility], [place] [address], [place], [place] [address] ?  ? Report Status PENDING  Incomplete  ?  ? ? ? ? ? ?Radiology Studies: ?DG Chest 2 View ? ?Result Date: 07/18/2021 ?CLINICAL DATA:  Concern for sepsis. Patient reports right-sided abdominal pain. Fever. EXAM: CHEST - 2 VIEW COMPARISON:  Chest radiograph 05/01/2021 FINDINGS: Decreased cardiomegaly from prior exam.  Aortic tortuosity. Subsegmental atelectasis in the lung bases, left greater than right. No pleural effusion, pulmonary edema, or pneumothorax. On limited assessment, no acute osseous abnormalities are seen. IMPRESSION

## 2021-07-20 NOTE — Consult Note (Signed)
? ?Chief Complaint: ?Patient was seen in consultation today for intra-abdominal fluid collection ? ?Referring Physician(s): ?Dr. Marcello Moores Cornett ? ?Supervising Physician: Aletta Edouard ? ?Patient Status: [facility] - In-pt ? ?History of Present Illness: ?Scott Foley is a 56 y.o. male with past medical history of CAD, diverticulosis, HLD, HTN, recurrent DVT/PE on Eliquis, COPD, prior stroke, and ERCP pancreatitis with infected necrosis who underwent percutaneous drainage 04/18/21 by Dr. Pascal Lux. Drain was subsequently pulled by surgeon the week of 05/25/21.  He now returns with with abdominal pain and fevers at home.  ? ?CT Abdomen Pelvis: ?1. Interval removal of the previously seen pigtail drainage catheter ?from the walled off right lower quadrant collection. Today the ?collection measures up to 6.5 cm and is increased in size from the ?previous study. There is a small tract extending from this right ?lower quadrant collection to the base of the cecum raising the ?possibility of a fistulous communication. ?2. Additional walled-off collections along the pancreatic body and ?head have both slightly decreased in size compared to the prior CT. ?3. Reactive wall thickening of the second and third portions of the ?duodenum adjacent to additional peripancreatic collections. ? ?Case reviewed and approved by Dr. Kathlene Cote.  ? ?Patient assessed at bedside.  He is understanding of the goals of the procedure.  His wife is also at bedside and asks about drain management as they are familiar from recent RLQ drain.  ? ?Past Medical History:  ?Diagnosis Date  ? Acquired thrombophilia ([place]) 12/06/2020  ? Acute pancreatitis after endoscopic retrograde cholangiopancreatography (ERCP) 03/03/2021  ? ERCP by Dr Odie Sera in [place] 03/03/2021  ? Chest pain 02/09/2021  ? Chronic obstructive pulmonary disease ([place]) 07/16/2018  ? Colon polyp 10/25/2020  ? serrated adenoma.  ? Coronary artery disease due to lipid rich plaque  01/01/2021  ? Coronary artery disease with stable angina pectoris ([place])   ? AMI No heart cath, treated medically [facility]  ? Diverticulosis of colon 10/25/2020  ? Noted on colnoscopy by Dr Vicente Males in [place].  ? DNR (do not resuscitate) 03/03/2019  ? Hyperlipemia   ? Hypertension   ? Hypertensive heart disease without congestive heart failure 12/06/2020  ? Morbid obesity ([place]) 12/06/2020  ? BMI 35 with serious comorbidities including CAD, HTN, STROKE, Hyperlipidemia.  ? Need for vaccination 12/15/2019  ? Pulmonary embolus ([place]) 03/03/2019  ? Pulmonary nodules/lesions, multiple 11/25/2012  ? 9/15/2014CT Chest : small nodule RUL LUL. Stable since 03/2012 Smoking cessation advised and Rx chantix Repeat CT Chest 4 /21/15: subpleural lymph nodes, likely benign repeat one year 2016>>>no change, considered benign Arlyce Harman 11/25/2012  NORMAL  ? [place], post-stroke 07/28/2019  ? Tobacco use disorder 11/25/2012  ? ? ?Past Surgical History:  ?Procedure Laterality Date  ? COLONOSCOPY WITH PROPOFOL N/A 10/25/2020  ? Jonathon Bellows, MD, at [facility]. 25 MM serrated adenoma polyp at ascending colon removed and site clipped.  Pan diverticulosis.  ? IR RADIOLOGIST EVAL & MGMT  05/25/2021  ? LAPAROSCOPIC CHOLECYSTECTOMY  02/2021  ? PERICARDIOCENTESIS N/A 05/01/2021  ? Procedure: PERICARDIOCENTESIS;  Surgeon: Martinique, Peter M, MD;  Location: [place] [facility];  Service: Cardiovascular;  Laterality: N/A;  ? TONSILLECTOMY    ? ? ?Allergies: ?Penicillin g ? ?Medications: ?Prior to Admission medications   ?Medication Sig Start Date End Date Taking? Authorizing Provider  ?albuterol (VENTOLIN HFA) 108 (90 Base) MCG/ACT inhaler Inhale 2 puffs into the lungs every 6 (six) hours as needed for wheezing or shortness of breath. 04/04/21  Yes Rip Harbour,  NP  ?apixaban (ELIQUIS) 5 MG TABS tablet Take 1 tablet (5 mg total) by mouth 2 (two) times daily. 05/19/21  Yes [name], [name], MD  ?clopidogrel (PLAVIX) 75 MG tablet Take 1 tablet (75 mg total) by mouth  daily. 05/19/21  Yes [name], [name], MD  ?Evolocumab (REPATHA) 140 MG/ML SOSY Inject 140 mg into the skin every 14 (fourteen) days. 05/19/21  Yes [name], [name], MD  ?Fluticasone-Umeclidin-Vilant (TRELEGY ELLIPTA) 100-62.5-25 MCG/ACT AEPB Inhale 1 puff into the lungs daily. 05/19/21  Yes [name], [name], MD  ?metoprolol tartrate (LOPRESSOR) 50 MG tablet Take 1 tablet (50 mg total) by mouth 2 (two) times daily. 05/19/21  Yes [name], [name], MD  ?Multiple Vitamin (MULTIVITAMIN WITH MINERALS) TABS tablet Take 1 tablet by mouth daily. 05/11/21  Yes Hosie Poisson, MD  ?Omega-3 Fatty Acids (FISH OIL) 1000 MG CAPS Take 1 capsule (1,000 mg total) by mouth 2 (two) times daily. 11/30/20  Yes [name], [name], MD  ?pantoprazole (PROTONIX) 40 MG tablet TAKE ONE TABLET BY MOUTH EVERY MORNING and TAKE ONE TABLET BY MOUTH EVERY EVENING ?Patient taking differently: 40 mg 2 (two) times daily. 07/14/21  Yes [name], [name], MD  ?potassium chloride SA (KLOR-CON M) 20 MEQ tablet Take 1 tablet (20 mEq total) by mouth daily. 05/27/21  Yes [name], [name], MD  ?rosuvastatin (CRESTOR) 40 MG tablet Take 1 tablet (40 mg total) by mouth at bedtime. 05/19/21  Yes [name], [name], MD  ?vitamin B-12 (CYANOCOBALAMIN) 1000 MCG tablet TAKE ONE TABLET BY MOUTH EVERY MORNING ?Patient taking differently: Take 1,000 mcg by mouth daily. 07/14/21  Yes CoxElnita Maxwell, MD  ?  ? ?Family History  ?Problem Relation Age of Onset  ? Hypertension Father   ? Heart attack Father   ? Stroke Father   ? Prostate cancer Father   ? Hypertension Sister   ? Hypertension Brother   ? Heart attack Brother   ? Lung cancer Sister   ? ? ?Social History  ? ?Socioeconomic History  ? Marital status: Married  ?  Spouse name: Not on file  ? Number of children: Not on file  ? Years of education: Not on file  ? Highest education level: Not on file  ?Occupational History  ? Not on file  ?Tobacco Use  ? Smoking status: Former  ?  Packs/day: 0.75  ?  Years: 30.00  ?  Pack years: 22.50  ?  Types: Cigarettes  ?  Start date:  03/14/1979  ?  Quit date: 11/2020  ?  Years since quitting: 0.6  ? Smokeless tobacco: Never  ?Vaping Use  ? Vaping Use: Former  ? Start date: 07/28/2018  ?Substance and Sexual Activity  ? Alcohol use: No  ? Drug use: No  ? Sexual activity: Yes  ?  Partners: Female  ?Other Topics Concern  ? Not on file  ?Social History Narrative  ? Not on file  ? ?Social Determinants of Health  ? ?Financial Resource Strain: Medium Risk  ? Difficulty of Paying Living Expenses: Somewhat hard  ?Food Insecurity: Not on file  ?Transportation Needs: No Transportation Needs  ? Lack of Transportation (Medical): No  ? Lack of Transportation (Non-Medical): No  ?Physical Activity: Not on file  ?Stress: Not on file  ?Social Connections: Not on file  ? ? ? ?Review of Systems: A 12 point ROS discussed and pertinent positives are indicated in the HPI above.  All other systems are negative. ? ?Review of Systems  ?Constitutional:  Negative for fatigue and fever.  ?Respiratory:  Negative for cough and shortness of breath.   ?Cardiovascular:  Negative for chest pain.  ?Gastrointestinal:  Negative for abdominal pain.  ?Musculoskeletal:  Negative for back pain.  ?Psychiatric/Behavioral:  Negative for behavioral problems and confusion.   ? ?Vital Signs: ?BP 129/86 (BP Location: Right Arm)   Pulse 79   Temp 97.7 ?F (36.5 ?C) (Oral)   Resp 16   Ht '5\' 4"'$  (1.626 m)   Wt 174 lb 2.6 oz (79 kg)   SpO2 94%   BMI 29.90 kg/m?  ? ?Physical Exam ?Vitals and nursing note reviewed.  ?Constitutional:   ?   General: He is not in acute distress. ?   Appearance: He is normal weight. He is not ill-appearing.  ?Cardiovascular:  ?   Rate and Rhythm: Normal rate and regular rhythm.  ?Pulmonary:  ?   Effort: Pulmonary effort is normal. No respiratory distress.  ?   Breath sounds: Normal breath sounds.  ?Abdominal:  ?   Palpations: Abdomen is soft.  ?Skin: ?   General: Skin is warm and dry.  ?Neurological:  ?   General: No focal deficit present.  ?   Mental Status: He is  alert and oriented to person, place, and time.  ?Psychiatric:     ?   Mood and Affect: Mood normal.     ?   Behavior: Behavior normal.  ? ? ? ?MD Evaluation ?Airway: WNL ?Heart: WNL ?Abdomen: WNL ?Chest/ Lungs: WNL ?ASA  Classi

## 2021-07-20 NOTE — Evaluation (Signed)
Physical Therapy Evaluation ?Patient Details ?Name: Scott Foley ?MRN: [id number] ?DOB: Apr 20, 1965 ?Today's Date: 07/20/2021 ? ?History of Present Illness ? Patient is a 56 y/o male who presents on 5/8 with right sided abdominal pain and fever; found to have necrotizing pancreatitis with enlargement of RLQ fluid collection.Pt had percutaneous drainage 04/18/21 by Dr. Pascal Lux and drain was pulled the week of 05/25/21. Plan for drain placement 07/21/21. PMH includes CAD, HTN, COPD, PE, CVA, IDA, tobacco abuse.  ?Clinical Impression ? Patient is independent and lives with wife in an RV while his home is being renovated. Pt uses SPC PRN and reports 1 fall recently due to tripping over something. Today, pt tolerated transfers and ambulation independent-Mod I without difficulty. Participated in higher level balance challenges- walking backwards, changes in gait speed, head turns etc without LOB. Scored 22/24 on DGI indicating pt is not a fall risk. Encouraged walking hallways and to bathroom as needed to maintain strength. Mobility techs following pt. Does not require skilled therapy services as pt functioning at baseline. Discharge from therapy.   ?   ? ?Recommendations for follow up therapy are one component of a multi-disciplinary discharge planning process, led by the attending physician.  Recommendations may be updated based on patient status, additional functional criteria and insurance authorization. ? ?Follow Up Recommendations No PT follow up ? ?  ?Assistance Recommended at Discharge PRN  ?Patient can return home with the following ?   ? ?  ?Equipment Recommendations None recommended by PT  ?Recommendations for Other Services ?    ?  ?Functional Status Assessment Patient has not had a recent decline in their functional status  ? ?  ?Precautions / Restrictions Precautions ?Precautions: None ?Restrictions ?Weight Bearing Restrictions: No  ? ?  ? ?Mobility ? Bed Mobility ?Overal bed mobility: Independent ?  ?  ?  ?   ?  ?  ?  ?  ? ?Transfers ?Overall transfer level: Modified independent ?Equipment used: None ?  ?  ?  ?  ?  ?  ?  ?General transfer comment: Stood from EOB without difficulty. No dizziness. ?  ? ?Ambulation/Gait ?Ambulation/Gait assistance: Modified independent (Device/Increase time) ?Gait Distance (Feet): 450 Feet ?Assistive device: None ?Gait Pattern/deviations: Step-through pattern, Decreased stride length ?  ?Gait velocity interpretation: 1.31 - 2.62 ft/sec, indicative of limited community ambulator ?  ?General Gait Details: Steady gait with no evidence of imbalance, see balance sections for details. ? ?Stairs ?  ?  ?  ?  ?  ? ?Wheelchair Mobility ?  ? ?Modified Rankin (Stroke Patients Only) ?  ? ?  ? ?Balance Overall balance assessment: Needs assistance ?Sitting-balance support: Feet supported, No upper extremity supported ?Sitting balance-Leahy Scale: Good ?  ?  ?Standing balance support: During functional activity ?Standing balance-Leahy Scale: Good ?Standing balance comment: Able to manage own IV pole unplugging and plugging it in wall ?  ?  ?  ?  ?  ?  ?High level balance activites: Backward walking, Direction changes, Turns, Sudden stops, Head turns ?High Level Balance Comments: Tolerated above with no evidence of imbalance or difficulty. ?Standardized Balance Assessment ?Standardized Balance Assessment : Dynamic Gait Index ?  ?Dynamic Gait Index ?Level Surface: Normal ?Change in Gait Speed: Normal ?Gait with Horizontal Head Turns: Normal ?Gait with Vertical Head Turns: Normal ?Gait and Pivot Turn: Mild Impairment ?Step Over Obstacle: Normal ?Step Around Obstacles: Normal ?Steps: Mild Impairment ?Total Score: 22 ?   ? ? ? ?Pertinent Vitals/Pain Pain Assessment ?Pain Assessment: 0-10 ?Pain  Score: 3  ?Pain Location: right flank ?Pain Descriptors / Indicators: Sore ?Pain Intervention(s): Monitored during session, Repositioned  ? ? ?Home Living Family/patient expects to be discharged to:: Shelter/Homeless ?   ?  ?  ?  ?  ?  ?  ?  ?  ?Additional Comments: pt and family are staying in RV until home renovations are completed  ?  ?Prior Function Prior Level of Function : Independent/Modified Independent ?  ?  ?  ?  ?  ?  ?Mobility Comments: Use of SPC as needed, reports only 1 fall tripping over something per report. ?ADLs Comments: does IADLs ?  ? ? ?Hand Dominance  ?   ? ?  ?Extremity/Trunk Assessment  ? Upper Extremity Assessment ?Upper Extremity Assessment: Defer to OT evaluation ?  ? ?Lower Extremity Assessment ?Lower Extremity Assessment: Overall WFL for tasks assessed ?  ? ?Cervical / Trunk Assessment ?Cervical / Trunk Assessment: Normal  ?Communication  ? Communication: No difficulties  ?Cognition Arousal/Alertness: Awake/alert ?Behavior During Therapy: [facility] for tasks assessed/performed ?Overall Cognitive Status: Within Functional Limits for tasks assessed ?  ?  ?  ?  ?  ?  ?  ?  ?  ?  ?  ?  ?  ?  ?  ?  ?  ?  ?  ? ?  ?General Comments General comments (skin integrity, edema, etc.): VSS on RA. ? ?  ?Exercises    ? ?Assessment/Plan  ?  ?PT Assessment Patient does not need any further PT services  ?PT Problem List   ? ?   ?  ?PT Treatment Interventions     ? ?PT Goals (Current goals can be found in the Care Plan section)  ?Acute Rehab PT Goals ?Patient Stated Goal: to feel better and go home ?PT Goal Formulation: All assessment and education complete, DC therapy ? ?  ?Frequency   ?  ? ? ?Co-evaluation   ?  ?  ?  ?  ? ? ?  ?AM-PAC PT "6 Clicks" Mobility  ?Outcome Measure Help needed turning from your back to your side while in a flat bed without using bedrails?: None ?Help needed moving from lying on your back to sitting on the side of a flat bed without using bedrails?: None ?Help needed moving to and from a bed to a chair (including a wheelchair)?: None ?Help needed standing up from a chair using your arms (e.g., wheelchair or bedside chair)?: None ?Help needed to walk in hospital room?: None ?Help needed climbing 3-5  steps with a railing? : A Little ?6 Click Score: 23 ? ?  ?End of Session   ?Activity Tolerance: Patient tolerated treatment well ?Patient left: in bed;with call bell/phone within reach ?Nurse Communication: Mobility status ?PT Visit Diagnosis: Pain;Difficulty in walking, not elsewhere classified (R26.2) ?Pain - part of body:  (abdomen) ?  ? ?Time: 4008-6761 ?PT Time Calculation (min) (ACUTE ONLY): 15 min ? ? ?Charges:   PT Evaluation ?$PT Eval Moderate Complexity: 1 Mod ?  ?  ?   ? ? ?Marisa Severin, PT, DPT ?Acute Rehabilitation Services ?Secure chat preferred ?Office [phone] ? ? ? ? ?[place] ?07/20/2021, 3:37 PM ? ?

## 2021-07-20 NOTE — Progress Notes (Signed)
? ?Subjective/Chief Complaint: ?Pt with flatus no BM PAIN ABOUT THE SAME  ? ? ?Objective: ?Vital signs in last 24 hours: ?Temp:  [97.5 ?F (36.4 ?C)-98.2 ?F (36.8 ?C)] 97.7 ?F (36.5 ?C) (05/10 6283) ?Pulse Rate:  [74-95] 79 (05/10 0743) ?Resp:  [16-18] 16 (05/10 0743) ?BP: (126-134)/(69-86) 129/86 (05/10 0743) ?SpO2:  [94 %-98 %] 94 % (05/10 0823) ?  ? ?Intake/Output from previous day: ?05/09 0701 - 05/10 0700 ?In: 1499.2 [I.V.:1214; IV Piggyback:285.2] ?Out: 1190 [TDVVO:1607] ?Intake/Output this shift: ?No intake/output data recorded. ? ? ? ?  ?Heart: regular ?Lungs: CTAB ?Abd: soft, mild diffuse tenderness more in epigastrium and RLQ, ND, +BS ? ?BMET ?Recent Labs  ?  07/19/21 ?0235 07/20/21 ?0145  ?NA 134* 134*  ?K 3.7 3.8  ?CL 105 106  ?CO2 20* 23  ?GLUCOSE 106* 99  ?BUN 17 15  ?CREATININE 1.18 1.06  ?CALCIUM 8.7* 8.6*  ? ?PT/INR ?No results for input(s): LABPROT, INR in the last 72 hours. ?ABG ?No results for input(s): PHART, HCO3 in the last 72 hours. ? ?Invalid input(s): PCO2, PO2 ? ?Studies/Results: ?DG Chest 2 View ? ?Result Date: 07/18/2021 ?CLINICAL DATA:  Concern for sepsis. Patient reports right-sided abdominal pain. Fever. EXAM: CHEST - 2 VIEW COMPARISON:  Chest radiograph 05/01/2021 FINDINGS: Decreased cardiomegaly from prior exam. Aortic tortuosity. Subsegmental atelectasis in the lung bases, left greater than right. No pleural effusion, pulmonary edema, or pneumothorax. On limited assessment, no acute osseous abnormalities are seen. IMPRESSION: Subsegmental atelectasis in the lung bases, left greater than right. No other acute findings. Electronically Signed   By: Keith Rake M.D.   On: 07/18/2021 19:52  ? ?CT ABDOMEN PELVIS W CONTRAST ? ?Result Date: 07/18/2021 ?CLINICAL DATA:  Pancreatitis.  Abdominal pain, fever EXAM: CT ABDOMEN AND PELVIS WITH CONTRAST TECHNIQUE: Multidetector CT imaging of the abdomen and pelvis was performed using the standard protocol following bolus administration of  intravenous contrast. RADIATION DOSE REDUCTION: This exam was performed according to the departmental dose-optimization program which includes automated exposure control, adjustment of the mA and/or kV according to patient size and/or use of iterative reconstruction technique. CONTRAST:  132m OMNIPAQUE IOHEXOL 300 MG/ML  SOLN COMPARISON:  05/25/2021 FINDINGS: Lower chest: Bibasilar atelectasis. Heart size within normal limits. Hepatobiliary: Prior cholecystectomy. Stable 9 mm low-density lesion at the right hepatic dome. No new focal liver abnormality. Pancreas: Pancreas is less edematous compared to the previous CT. Mild residual peripancreatic fat stranding in the region of the pancreatic head. Area of walled-off necrosis along the inferior aspect of the pancreatic body measures 2.7 x 1.8 x 2.4 cm (series 3, image 33), previously measured up to 3.0 cm. Additional walled-off collection inferior to the pancreatic head measures up to 3.8 x 1.4 x 3.8 cm (series 3, image 46). There is a small tract extending to larger walled-off collection in the right lower quadrant overlying the right iliacus muscle. Interval removal of the previously seen pigtail drainage catheter from this collection. Today the collection measures approximately 6.5 x 4.3 x 4.3 cm (series 3, image 56), and is increased in size from the previous study (previously measured up to approximately 5.0 cm). Spleen: Normal in size without focal abnormality. Adrenals/Urinary Tract: Unremarkable adrenal glands. Kidneys enhance symmetrically without focal lesion, stone, or hydronephrosis. Ureters are nondilated. Urinary bladder appears unremarkable. Stomach/Bowel: Stomach is unremarkable. No dilated loops of small bowel. Enhancing fluid track from walled-off collection in the right lower quadrant extends towards the base of the cecum raising the possibility of a fistulous  track (series 6, images 45-47). There is also wall thickening of the second and third  portions of the duodenum adjacent to additional peripancreatic collection. Vascular/Lymphatic: Aortic atherosclerosis with right infrarenal penetrating atheromatous ulceration (series 3, image 38), unchanged in appearance from prior. No abdominopelvic lymphadenopathy. Reproductive: Prostate is unremarkable. Other: No pneumoperitoneum.  No abdominal wall hernia. Musculoskeletal: No acute bony findings. IMPRESSION: 1. Interval removal of the previously seen pigtail drainage catheter from the walled off right lower quadrant collection. Today the collection measures up to 6.5 cm and is increased in size from the previous study. There is a small tract extending from this right lower quadrant collection to the base of the cecum raising the possibility of a fistulous communication. 2. Additional walled-off collections along the pancreatic body and head have both slightly decreased in size compared to the prior CT. 3. Reactive wall thickening of the second and third portions of the duodenum adjacent to additional peripancreatic collections. Aortic Atherosclerosis (ICD10-I70.0). Electronically Signed   By: Davina Poke D.O.   On: 07/18/2021 20:19   ? ?Anti-infectives: ?Anti-infectives (From admission, onward)  ? ? Start     Dose/Rate Route Frequency Ordered Stop  ? 07/19/21 0600  ceFEPIme (MAXIPIME) 2 g in sodium chloride 0.9 % 100 mL IVPB       ? 2 g ?200 mL/hr over 30 Minutes Intravenous Every 8 hours 07/18/21 2217    ? 07/18/21 2100  ceFEPIme (MAXIPIME) 2 g in sodium chloride 0.9 % 100 mL IVPB       ? 2 g ?200 mL/hr over 30 Minutes Intravenous  Once 07/18/21 2053 07/18/21 2219  ? 07/18/21 2100  metroNIDAZOLE (FLAGYL) IVPB 500 mg       ? 500 mg ?100 mL/hr over 60 Minutes Intravenous Every 12 hours 07/18/21 2053    ? ?  ? ? ?Assessment/Plan: ?Recurrent RLQ fluid collection with other small intra-abdominal fluid collections ?Post-ERCP pancreatitis, developed into infected necrosis requiring IR drain in 2/23 ?- s/p lap  chole 03/02/21 by Dr. Lilia Pro with abnormal IOC >> underwent ERCP 03/03/21 ?-patient has redeveloped another fluid collection in the RLQ.  Unclear if he has a duct that is leaking or a fistula that is causing this reformation.   ?-ask IR to plan for new drain placement for now to allow for better determination of treatment plan moving forward ?-may have a diet after his drain placement, but if unable to do today, may eat ?-cont IV abx therapy for now, WBC down to 16 from 18K yesterday. ?DRAIN TOMORROW ACCORDING TO IR  ?-hold eliquis and plavix currently for drain procedure ?  ?FEN - NPO for procedure  BUT CAN EAT TODAY IF HE WANTS  ? ?VTE - hold eliquis and plavix, SCDs ?ID - cefepime/Flagyl ?  ?COPD ?CAD ?HTN ?HLD ? ? LOS: 2 days  ? ? ?Blasa Raisch A Mahi Zabriskie ?07/20/2021 ? ?

## 2021-07-20 NOTE — Progress Notes (Signed)
Mobility Specialist Progress Note: ? ? 07/20/21 1149  ?Mobility  ?Activity Ambulated with assistance in hallway  ?Level of Assistance Modified independent, requires aide device or extra time  ?Assistive Device None  ?Distance Ambulated (ft) 570 ft  ?Activity Response Tolerated well  ?$Mobility charge 1 Mobility  ? ?Pt received in bed willing to participate in mobility. No complaints of pain. Left in bed with call bell in reach and all needs met.  ? ?Scott Foley ?Mobility Specialist ?Primary Phone [phone] ? ?

## 2021-07-21 ENCOUNTER — Inpatient Hospital Stay (HOSPITAL_COMMUNITY): Payer: Medicare HMO

## 2021-07-21 DIAGNOSIS — R188 Other ascites: Secondary | ICD-10-CM | POA: Diagnosis not present

## 2021-07-21 LAB — COMPREHENSIVE METABOLIC PANEL
ALT: 12 U/L (ref 0–44)
AST: 12 U/L — ABNORMAL LOW (ref 15–41)
Albumin: 2.4 g/dL — ABNORMAL LOW (ref 3.5–5.0)
Alkaline Phosphatase: 65 U/L (ref 38–126)
Anion gap: 6 (ref 5–15)
BUN: 19 mg/dL (ref 6–20)
CO2: 23 mmol/L (ref 22–32)
Calcium: 9 mg/dL (ref 8.9–10.3)
Chloride: 108 mmol/L (ref 98–111)
Creatinine, Ser: 1.16 mg/dL (ref 0.61–1.24)
GFR, Estimated: >60 mL/min (ref >60)
Glucose, Bld: 102 mg/dL — ABNORMAL HIGH (ref 70–99)
Potassium: 3.7 mmol/L (ref 3.5–5.1)
Sodium: 137 mmol/L (ref 135–145)
Total Bilirubin: 0.4 mg/dL (ref 0.3–1.2)
Total Protein: 6.8 g/dL (ref 6.5–8.1)

## 2021-07-21 LAB — CBC
HCT: 31.5 % — ABNORMAL LOW (ref 39.0–52.0)
Hemoglobin: 10.3 g/dL — ABNORMAL LOW (ref 13.0–17.0)
MCH: 28.1 pg (ref 26.0–34.0)
MCHC: 32.7 g/dL (ref 30.0–36.0)
MCV: 86.1 fL (ref 80.0–100.0)
Platelets: 319 10*3/uL (ref 150–400)
RBC: 3.66 MIL/uL — ABNORMAL LOW (ref 4.22–5.81)
RDW: 16.2 % — ABNORMAL HIGH (ref 11.5–15.5)
WBC: 9.4 10*3/uL (ref 4.0–10.5)
nRBC: 0 % (ref 0.0–0.2)

## 2021-07-21 LAB — PROTIME-INR
INR: 1.2 (ref 0.8–1.2)
Prothrombin Time: 14.6 seconds (ref 11.4–15.2)

## 2021-07-21 MED ORDER — SODIUM CHLORIDE 0.9% FLUSH
5.0000 mL | Freq: Three times a day (TID) | INTRAVENOUS | Status: DC
  Administered 2021-07-21 – 2021-07-23 (×6): 5 mL

## 2021-07-21 MED ORDER — MIDAZOLAM HCL 2 MG/2ML IJ SOLN
INTRAMUSCULAR | Status: AC
  Filled 2021-07-21: qty 2

## 2021-07-21 MED ORDER — FENTANYL CITRATE (PF) 100 MCG/2ML IJ SOLN
INTRAMUSCULAR | Status: AC
  Filled 2021-07-21: qty 2

## 2021-07-21 MED ORDER — LIDOCAINE-EPINEPHRINE 1 %-1:100000 IJ SOLN
INTRAMUSCULAR | Status: AC
Start: 2021-07-21 — End: 2021-07-21
  Filled 2021-07-21: qty 1

## 2021-07-21 MED ORDER — MIDAZOLAM HCL 2 MG/2ML IJ SOLN
INTRAMUSCULAR | Status: AC | PRN
  Administered 2021-07-21: .5 mg via INTRAVENOUS
  Administered 2021-07-21: 1 mg via INTRAVENOUS

## 2021-07-21 MED ORDER — FENTANYL CITRATE (PF) 100 MCG/2ML IJ SOLN
INTRAMUSCULAR | Status: AC | PRN
  Administered 2021-07-21: 50 ug via INTRAVENOUS
  Administered 2021-07-21: 25 ug via INTRAVENOUS

## 2021-07-21 NOTE — Procedures (Signed)
Interventional Radiology Procedure Note ? ?Procedure: Placement of a 33F drain into the intra-abdominal fluid collection via the RLQ.  60 mL thick, purulent bloody fluid aspirated.  Sample sent for culture.  ? ?Complications: None ? ?Estimated Blood Loss: None ? ?Recommendations: ?- Drain to JP ?- Flush at least Q shift ?- Cx pending ? ?Signed, ? ?Criselda Peaches, MD ? ? ?

## 2021-07-21 NOTE — Evaluation (Signed)
Occupational Therapy Evaluation ?Patient Details ?Name: Scott Foley ?MRN: [id number] ?DOB: 10-Sep-1965 ?Today's Date: 07/21/2021 ? ? ?History of Present Illness Patient is a 56 y/o male who presents on 5/8 with right sided abdominal pain and fever; found to have necrotizing pancreatitis with enlargement of RLQ fluid collection.Pt had percutaneous drainage 04/18/21 by Dr. Pascal Lux and drain was pulled the week of 05/25/21. Plan for drain placement 07/21/21. PMH includes CAD, HTN, COPD, PE, CVA, IDA, tobacco abuse.  ? ?Clinical Impression ?  ?Pt lives with wife and at this time is living in a RV due to renovations being completed on mobile home. Pt was educated about shower chair versus shower seat with safety and AE with LE dressing if they have any changes in pain with LE ADLS. Pt voiced an understanding. Pt was able to complete mobility in room and hallway with no LOB, SOB or dizziness while completion of IV pole. Occupational Therapy signing off at this time. Thank you.    ? ?Recommendations for follow up therapy are one component of a multi-disciplinary discharge planning process, led by the attending physician.  Recommendations may be updated based on patient status, additional functional criteria and insurance authorization.  ? ?Follow Up Recommendations ? No OT follow up  ?  ?Assistance Recommended at Discharge PRN  ?Patient can return home with the following   ? ?  ?Functional Status Assessment ? Patient has had a recent decline in their functional status and demonstrates the ability to make significant improvements in function in a reasonable and predictable amount of time.  ?Equipment Recommendations ?    ?  ?Recommendations for Other Services   ? ? ?  ?Precautions / Restrictions Precautions ?Precautions: None ?Restrictions ?Weight Bearing Restrictions: No  ? ?  ? ?Mobility Bed Mobility ?Overal bed mobility: Independent ?  ?  ?  ?  ?  ?  ?  ?  ? ?Transfers ?Overall transfer level: Modified  independent ?Equipment used: None ?  ?  ?  ?  ?  ?  ?  ?General transfer comment: Stood from EOB without difficulty. No dizziness. ?  ? ?  ?Balance Overall balance assessment: Modified Independent ?Sitting-balance support: Feet supported ?Sitting balance-Leahy Scale: Good ?  ?  ?  ?Standing balance-Leahy Scale: Good ?Standing balance comment: able to complete IV management ?  ?  ?  ?  ?  ?  ?  ?  ?  ?  ?  ?   ? ?ADL either performed or assessed with clinical judgement  ? ?ADL Overall ADL's : Modified independent ?  ?  ?  ?  ?  ?  ?  ?  ?  ?  ?  ?  ?  ?  ?  ?  ?  ?  ?  ?General ADL Comments: Pt educated though if they have increas in R side pain on how they can use AE to complete LE ADLS  ? ? ? ?Vision   ?   ?   ?Perception   ?  ?Praxis   ?  ? ?Pertinent Vitals/Pain Pain Assessment ?Pain Assessment: Faces ?Faces Pain Scale: Hurts a little bit ?Pain Location: right flank ?Pain Descriptors / Indicators: Sore ?Pain Intervention(s): Limited activity within patient's tolerance, Monitored during session, Repositioned  ? ? ? ?Hand Dominance   ?  ?Extremity/Trunk Assessment Upper Extremity Assessment ?Upper Extremity Assessment: Overall WFL for tasks assessed ?  ?Lower Extremity Assessment ?Lower Extremity Assessment: Defer to PT evaluation ?  ?Cervical / Trunk  Assessment ?Cervical / Trunk Assessment: Normal ?  ?Communication Communication ?Communication: No difficulties ?  ?Cognition Arousal/Alertness: Awake/alert ?Behavior During Therapy: [facility] for tasks assessed/performed ?Overall Cognitive Status: Within Functional Limits for tasks assessed ?  ?  ?  ?  ?  ?  ?  ?  ?  ?  ?  ?  ?  ?  ?  ?  ?  ?  ?  ?General Comments    ? ?  ?Exercises   ?  ?Shoulder Instructions    ? ? ?Home Living Family/patient expects to be discharged to:: Other (Comment) ?  ?  ?  ?  ?  ?  ?  ?  ?  ?  ?  ?  ?  ?  ?  ?  ?Additional Comments: pt and family are staying in RV until home renovations are completed ?  ? ?  ?Prior Functioning/Environment Prior  Level of Function : Independent/Modified Independent ?  ?  ?  ?  ?  ?  ?Mobility Comments: Use of SPC as needed, reports only 1 fall tripping over something per report. ?ADLs Comments: does IADLs ?  ? ?  ?  ?OT Problem List: Decreased activity tolerance;Impaired balance (sitting and/or standing);Decreased safety awareness;Decreased knowledge of use of DME or AE;Pain ?  ?   ?OT Treatment/Interventions:    ?  ?OT Goals(Current goals can be found in the care plan section) Acute Rehab OT Goals ?Patient Stated Goal: to be able to get out hiking ?OT Goal Formulation: With patient ?Time For Goal Achievement: 08/04/21 ?Potential to Achieve Goals: Good  ?OT Frequency:   ?  ? ?Co-evaluation   ?  ?  ?  ?  ? ?  ?AM-PAC OT "6 Clicks" Daily Activity     ?Outcome Measure Help from another person eating meals?: None ?Help from another person taking care of personal grooming?: None ?Help from another person toileting, which includes using toliet, bedpan, or urinal?: None ?Help from another person bathing (including washing, rinsing, drying)?: None ?Help from another person to put on and taking off regular upper body clothing?: None ?Help from another person to put on and taking off regular lower body clothing?: None ?6 Click Score: 24 ?  ?End of Session Equipment Utilized During Treatment: Gait belt ?Nurse Communication: Mobility status ? ?Activity Tolerance: Patient tolerated treatment well ?Patient left: in bed;with call bell/phone within reach ? ?OT Visit Diagnosis: Pain ?Pain - Right/Left: Right ?Pain - part of body:  (abdomen)  ?              ?Time: [phone] ?OT Time Calculation (min): 16 min ?Charges:  OT General Charges ?$OT Visit: 1 Visit ?OT Evaluation ?$OT Eval Low Complexity: 1 Low ? ?Joeseph Amor OTR/L  ?Acute Rehab Services  ?[phone] office number ?[phone] pager number ? ? ?Joeseph Amor ?07/21/2021, 8:17 AM ?

## 2021-07-21 NOTE — Progress Notes (Addendum)
? ?  Subjective/Chief Complaint: ?Feels well, no new complaints.  Drain just placed.  Got 60cc of bloody purulent fluid.  Eating well.  Minimal pain ? ? ?Objective: ?Vital signs in last 24 hours: ?Temp:  [97.8 ?F (36.6 ?C)-97.9 ?F (36.6 ?C)] 97.8 ?F (36.6 ?C) (05/11 0418) ?Pulse Rate:  [74-93] 81 (05/11 0940) ?Resp:  [11-19] 14 (05/11 0940) ?BP: (112-170)/(75-97) 133/91 (05/11 0940) ?SpO2:  [94 %-99 %] 94 % (05/11 0940) ?Last BM Date : 07/20/21 (according to patient) ? ?Intake/Output from previous day: ?05/10 0701 - 05/11 0700 ?In: -  ?Out: 500 [Urine:500] ?Intake/Output this shift: ?Total I/O ?In: -  ?Out: 22 [Drains:80] ? ? ?PE: ?Abd: soft, mild diffuse tenderness more in epigastrium and RLQ, ND, +BS.  RLQ JP Drain with serosang fluid, about 40cc currently in bulb ? ?BMET ?Recent Labs  ?  07/20/21 ?0145 07/21/21 ?0601  ?NA 134* 137  ?K 3.8 3.7  ?CL 106 108  ?CO2 23 23  ?GLUCOSE 99 102*  ?BUN 15 19  ?CREATININE 1.06 1.16  ?CALCIUM 8.6* 9.0  ? ?PT/INR ?Recent Labs  ?  07/21/21 ?0601  ?LABPROT 14.6  ?INR 1.2  ? ?ABG ?No results for input(s): PHART, HCO3 in the last 72 hours. ? ?Invalid input(s): PCO2, PO2 ? ?Studies/Results: ?No results found. ? ?Anti-infectives: ?Anti-infectives (From admission, onward)  ? ? Start     Dose/Rate Route Frequency Ordered Stop  ? 07/19/21 0600  ceFEPIme (MAXIPIME) 2 g in sodium chloride 0.9 % 100 mL IVPB       ? 2 g ?200 mL/hr over 30 Minutes Intravenous Every 8 hours 07/18/21 2217    ? 07/18/21 2100  ceFEPIme (MAXIPIME) 2 g in sodium chloride 0.9 % 100 mL IVPB       ? 2 g ?200 mL/hr over 30 Minutes Intravenous  Once 07/18/21 2053 07/18/21 2219  ? 07/18/21 2100  metroNIDAZOLE (FLAGYL) IVPB 500 mg       ? 500 mg ?100 mL/hr over 60 Minutes Intravenous Every 12 hours 07/18/21 2053    ? ?  ? ? ?Assessment/Plan: ?Recurrent RLQ fluid collection with other small intra-abdominal fluid collections ?Post-ERCP pancreatitis, developed into infected necrosis requiring IR drain in 2/23 ?- s/p lap  chole 03/02/21 by Dr. Lilia Pro with abnormal IOC >> underwent ERCP 03/03/21 ?-patient has redeveloped another fluid collection in the RLQ.  Unclear if he has a duct that is leaking or a fistula that is causing this reformation.   ?-drain placed today.  Likely needs just 7 days total of abx therapy. ?-follow up on cultures ?-send drain fluid for amylase to see if there may be a pancreatic leak ?-hold eliquis and plavix currently for drain procedure, but can likely be restarted at this point.  ?-follow up with Dr. Zenia Resides in 2 weeks ?-discussed plan of care with primary team and with wife who was at the bedside. ?  ?FEN - HH diet ?VTE - hold eliquis and plavix, but can likely resume tomorrow am, SCDs ?ID - cefepime/Flagyl ?  ?COPD ?CAD ?HTN ?HLD ? ?I reviewed hospitalist notes, last 24 h vitals and pain scores, last 48 h intake and output, last 24 h labs and trends, and last 24 h imaging results. ? ? LOS: 3 days  ? ? ?Henreitta Cea ?07/21/2021 ? ?

## 2021-07-21 NOTE — Progress Notes (Signed)
Pharmacy Antibiotic Note ? ?Scott Foley is a 56 y.o. male admitted on 07/18/2021 with  intra-abdominal abscess secondary to necrotizing pancreatitis .  Pharmacy has been consulted for Cefepime dosing, also on Flagyl. ? ?SCr wnl, WBC down to normal. Cultures ngtd thus far. IR to place perc drain today. ? ?Plan: ?-Continue cefepime 2 g IV q8h ?-Monitor CBC, renal fx, cultures and clinical progress ? ?Height: '5\' 4"'$  (162.6 cm) ?Weight: 79 kg (174 lb 2.6 oz) ?IBW/kg (Calculated) : 59.2 ? ?Temp (24hrs), Avg:97.8 ?F (36.6 ?C), Min:97.8 ?F (36.6 ?C), Max:97.9 ?F (36.6 ?C) ? ?Recent Labs  ?Lab 07/18/21 ?1807 07/19/21 ?0235 07/20/21 ?0145 07/21/21 ?0601  ?WBC 18.3* 16.5* 10.1 9.4  ?CREATININE 1.24 1.18 1.06 1.16  ? ?  ?Estimated Creatinine Clearance: 66.7 mL/min (by C-G formula based on SCr of 1.16 mg/dL).   ? ?Allergies  ?Allergen Reactions  ? Penicillin G Hives  ? ? ?Antimicrobials this admission: ?Cefepime 5/8 >>  ?Flagyl 5/8 >>  ? ?Dose adjustments this admission: ? ? ?Microbiology results: ?5/8 BCx: ngtd ?5/9 BCx: ngtd ?5/8 UCx: pending  ? ?Thank you for involving pharmacy in this patient's care. ? ?Renold Genta, PharmD, BCPS ?Clinical Pharmacist ?Clinical phone for 07/21/2021 until 3p is [phone] ?07/21/2021 8:49 AM ? ?**Pharmacist phone directory can be found on [email or web address] listed under [place]** ? ?

## 2021-07-21 NOTE — Progress Notes (Signed)
?PROGRESS NOTE ? ? ? ?Scott Foley  HUD:[id number] DOB: [birth year] DOA: 07/18/2021 ?PCP: Scott Brome, MD  ? ? ?Brief Narrative:  ? ?Scott Foley is a 56 year old male with past medical history significant for CAD, recurrent DVT/PE on Eliquis, HTN, COPD, history of CVA, ERCP pancreatitis with infected necrosis s/p drain placement by IR February 2023 with subsequent removal who presented to [facility] ED on 5/8 from home with complaint of abdominal pain and fever.  Reports 1 week history of right lower quadrant pain with fever up to 101.0 ?F.  On arrival to the ED, WBC count elevated 18, CT abdomen/pelvis with walled off right lower quadrant collection measuring up to 6.5 cm which is increased in size from previous study; also with a small tract extending from his right lower quadrant collection to the base of the cecum raising concern for possible fistulous communication.  General surgery was consulted.  Patient was started on broad-spectrum antibiotics.  Hospitalist service consulted for admission. ? ? ?Assessment & Plan: ?  ?Intra-abdominal fluid collection, recurrent ?Patient presenting to ED with progressive and recurrent abdominal pain localized to the right lower quadrant.  Patient with history of laparoscopic cholecystectomy December 2022 and developed post ERCP pancreatitis that developed into infected necrosis requiring IR drain placement February 2023.  Drain was subsequently removed and now CT abdomen/pelvis demonstrating recurrence of right lower quadrant collection measuring up to 6.5 cm. ?--General surgery following, appreciate assistance ?--WBC 18.3>16.5>10.1>9.4 ?--Blood cultures x2: No growth x 3 days ?--Cefepime 2 g IV every 8 hours ?--Metronidazole 500 mg IV every 12 hours ?--Pending drain placement by IR today ?--CBC daily ? ?History of recurrent PE/DVT ?--Holding home Eliquis for planned IR drain placement today ? ?Hypokalemia ?Repleted, potassium 3.7 this morning. ?--Continue home  potassium 20 mEq daily ? ?Hyponatremia ?Sodium 134>137, now resolved.  Likely secondary to poor oral intake.  Continue to encourage increased oral intake. ?--BMP daily ? ?CAD ?Essential hypertension ?Stable. ?--Holding home Plavix ?--Metoprolol tartrate 50 mg p.o. twice daily ?--Continue Crestor 40 mg p.o. daily ? ?COPD ?Stable, on room air. ?--Breo Ellipta 1 puff daily ?--Incruse Ellipta 1 puff daily ?--Albuterol inhaler every 6 hours as needed shortness of breath/wheezing ? ?GERD: Protonix 40 mg p.o. twice daily ? ?Normocytic anemia ?Hemoglobin 9.1, stable. ? ?Physical deconditioning: ?Seen by PT and OT with no recommendations on discharge.  Continue to encourage increased ambulation while inpatient. ? ? ?DVT prophylaxis: SCDs Start: 07/18/21 2208 ? ?  Code Status: Full Code ?Family Communication: No family present at bedside this morning, updated extensively yesterday afternoon.  Anticipate discharge home in 1-2 days once obtain source control and cultures return from drain placement and await further recommendations from general surgery ? ?Disposition Plan:  ?Level of care: Med-Surg ?Status is: Inpatient ?Remains inpatient appropriate because: Pending IR guided drain placement today. ?  ? ?Consultants:  ?General surgery ?Interventional radiology ? ?Procedures:  ?None ? ?Antimicrobials:  ?Cefepime 5/8>> ?Metronidazole 5/8>> ? ? ?Subjective: ?Patient seen and examined at bedside, resting comfortably.  Abdominal pain much improved.  Awaiting drain placement later this morning.  Discussed with general surgery, anticipate discharge once cultures return and ensure source control with drain placement.  Also will need outpatient follow-up with Scott Foley for further evaluation of possible fistulous formation.  No specific complaints this morning.  No other specific questions or concerns at this time.  Denies headache, no visual changes, no fever/chills/night sweats, no nausea/vomiting/diarrhea, no chest pain, no  palpitations, no shortness of breath, no weakness, no  fatigue, no paresthesias.  No acute events overnight per nursing staff. ? ?Objective: ?Vitals:  ? 07/21/21 0920 07/21/21 0925 07/21/21 0930 07/21/21 0940  ?BP: (!) 152/97 (!) 141/96 (!) 137/94 (!) 133/91  ?Pulse: 79 81 83 81  ?Resp: '16 11 19 14  '$ ?Temp:      ?TempSrc:      ?SpO2: 96% 94% 95% 94%  ?Weight:      ?Height:      ? ? ?Intake/Output Summary (Last 24 hours) at 07/21/2021 1132 ?Last data filed at 07/21/2021 1100 ?Gross per 24 hour  ?Intake --  ?Output 580 ml  ?Net -580 ml  ? ?Filed Weights  ? 07/18/21 1747  ?Weight: 79 kg  ? ? ?Examination: ? ?Physical Exam: ?GEN: NAD, alert and oriented x 3,  ?HEENT: NCAT, PERRL, EOMI, sclera clear, MMM ?PULM: CTAB w/o wheezes/crackles, normal respiratory effort ?CV: RRR w/o M/G/R ?GI: abd soft, nondistended, mild tenderness to palpation in the right lower quadrant, NABS, no R/G/M ?MSK: no peripheral edema, muscle strength globally intact 5/5 bilateral upper/lower extremities ?NEURO: CN II-XII intact, no focal deficits, sensation to light touch intact ?PSYCH: normal mood/affect ?Integumentary: dry/intact, no rashes or wounds ? ? ? ?Data Reviewed: I have personally reviewed following labs and imaging studies ? ?CBC: ?Recent Labs  ?Lab 07/18/21 ?1807 07/19/21 ?0235 07/20/21 ?0145 07/21/21 ?0601  ?WBC 18.3* 16.5* 10.1 9.4  ?NEUTROABS 13.4*  --   --   --   ?HGB 10.4* 9.6* 9.1* 10.3*  ?HCT 33.0* 29.4* 29.5* 31.5*  ?MCV 87.5 87.2 88.9 86.1  ?PLT 381 313 283 319  ? ?Basic Metabolic Panel: ?Recent Labs  ?Lab 07/18/21 ?1807 07/18/21 ?2122 07/19/21 ?0235 07/20/21 ?0145 07/21/21 ?0601  ?NA 135  --  134* 134* 137  ?K 3.0*  --  3.7 3.8 3.7  ?CL 103  --  105 106 108  ?CO2 20*  --  20* 23 23  ?GLUCOSE 111*  --  106* 99 102*  ?BUN 18  --  '17 15 19  '$ ?CREATININE 1.24  --  1.18 1.06 1.16  ?CALCIUM 9.0  --  8.7* 8.6* 9.0  ?MG  --  1.7 2.0  --   --   ? ?GFR: ?Estimated Creatinine Clearance: 66.7 mL/min (by C-G formula based on SCr of 1.16  mg/dL). ?Liver Function Tests: ?Recent Labs  ?Lab 07/18/21 ?1807 07/19/21 ?0235 07/20/21 ?0145 07/21/21 ?0601  ?AST 11* 12* 10* 12*  ?ALT '11 10 10 12  '$ ?ALKPHOS 67 59 62 65  ?BILITOT 0.8 1.1 0.4 0.4  ?PROT 7.3 6.7 6.3* 6.8  ?ALBUMIN 3.2* 2.8* 2.5* 2.4*  ? ?Recent Labs  ?Lab 07/18/21 ?1807  ?LIPASE 30  ? ?No results for input(s): AMMONIA in the last 168 hours. ?Coagulation Profile: ?Recent Labs  ?Lab 07/21/21 ?0601  ?INR 1.2  ? ?Cardiac Enzymes: ?No results for input(s): CKTOTAL, CKMB, CKMBINDEX, TROPONINI in the last 168 hours. ?BNP (last 3 results) ?No results for input(s): PROBNP in the last 8760 hours. ?HbA1C: ?No results for input(s): HGBA1C in the last 72 hours. ?CBG: ?No results for input(s): GLUCAP in the last 168 hours. ?Lipid Profile: ?No results for input(s): CHOL, HDL, LDLCALC, TRIG, CHOLHDL, LDLDIRECT in the last 72 hours. ?Thyroid Function Tests: ?No results for input(s): TSH, T4TOTAL, FREET4, T3FREE, THYROIDAB in the last 72 hours. ?Anemia Panel: ?No results for input(s): VITAMINB12, FOLATE, FERRITIN, TIBC, IRON, RETICCTPCT in the last 72 hours. ?Sepsis Labs: ?No results for input(s): PROCALCITON, LATICACIDVEN in the last 168 hours. ? ?Recent Results (from  the past 240 hour(s))  ?Blood culture (routine x 2)     Status: None (Preliminary result)  ? Collection Time: 07/18/21  9:22 PM  ? Specimen: BLOOD  ?Result Value Ref Range Status  ? Specimen Description BLOOD LEFT ANTECUBITAL  Final  ? Special Requests   Final  ?  BOTTLES DRAWN AEROBIC AND ANAEROBIC Blood Culture adequate volume  ? Culture   Final  ?  NO GROWTH 3 DAYS ?Performed at [facility], [place] [address] [place], [place] [address] ?  ? Report Status PENDING  Incomplete  ?Blood culture (routine x 2)     Status: None (Preliminary result)  ? Collection Time: 07/19/21  9:35 AM  ? Specimen: BLOOD  ?Result Value Ref Range Status  ? Specimen Description BLOOD RIGHT ANTECUBITAL  Final  ? Special Requests   Final  ?  BOTTLES DRAWN AEROBIC AND  ANAEROBIC Blood Culture results may not be optimal due to an excessive volume of blood received in culture bottles  ? Culture   Final  ?  NO GROWTH 2 DAYS ?Performed at [facility], [place],

## 2021-07-22 DIAGNOSIS — R188 Other ascites: Secondary | ICD-10-CM | POA: Diagnosis not present

## 2021-07-22 LAB — CBC
HCT: 30.8 % — ABNORMAL LOW (ref 39.0–52.0)
Hemoglobin: 9.5 g/dL — ABNORMAL LOW (ref 13.0–17.0)
MCH: 27.3 pg (ref 26.0–34.0)
MCHC: 30.8 g/dL (ref 30.0–36.0)
MCV: 88.5 fL (ref 80.0–100.0)
Platelets: 341 10*3/uL (ref 150–400)
RBC: 3.48 MIL/uL — ABNORMAL LOW (ref 4.22–5.81)
RDW: 16.3 % — ABNORMAL HIGH (ref 11.5–15.5)
WBC: 9.2 10*3/uL (ref 4.0–10.5)
nRBC: 0 % (ref 0.0–0.2)

## 2021-07-22 LAB — BASIC METABOLIC PANEL
Anion gap: 7 (ref 5–15)
BUN: 19 mg/dL (ref 6–20)
CO2: 24 mmol/L (ref 22–32)
Calcium: 8.7 mg/dL — ABNORMAL LOW (ref 8.9–10.3)
Chloride: 103 mmol/L (ref 98–111)
Creatinine, Ser: 1.54 mg/dL — ABNORMAL HIGH (ref 0.61–1.24)
GFR, Estimated: 52 mL/min — ABNORMAL LOW (ref >60)
Glucose, Bld: 114 mg/dL — ABNORMAL HIGH (ref 70–99)
Potassium: 3.4 mmol/L — ABNORMAL LOW (ref 3.5–5.1)
Sodium: 134 mmol/L — ABNORMAL LOW (ref 135–145)

## 2021-07-22 MED ORDER — SODIUM CHLORIDE 0.9 % IV SOLN
2.0000 g | Freq: Two times a day (BID) | INTRAVENOUS | Status: DC
  Administered 2021-07-22 – 2021-07-23 (×2): 2 g via INTRAVENOUS
  Filled 2021-07-22 (×2): qty 12.5

## 2021-07-22 MED ORDER — APIXABAN 5 MG PO TABS
5.0000 mg | ORAL_TABLET | Freq: Two times a day (BID) | ORAL | Status: DC
  Administered 2021-07-23: 5 mg via ORAL
  Filled 2021-07-22: qty 1

## 2021-07-22 MED ORDER — POTASSIUM CHLORIDE CRYS ER 20 MEQ PO TBCR
40.0000 meq | EXTENDED_RELEASE_TABLET | Freq: Once | ORAL | Status: AC
  Administered 2021-07-22: 40 meq via ORAL
  Filled 2021-07-22: qty 2

## 2021-07-22 MED ORDER — CLOPIDOGREL BISULFATE 75 MG PO TABS
75.0000 mg | ORAL_TABLET | Freq: Every day | ORAL | Status: DC
Start: 2021-07-23 — End: 2021-07-23
  Administered 2021-07-23: 75 mg via ORAL
  Filled 2021-07-22: qty 1

## 2021-07-22 MED ORDER — SODIUM CHLORIDE 0.9 % IV SOLN: INTRAVENOUS | Status: AC

## 2021-07-22 NOTE — Progress Notes (Signed)
?PROGRESS NOTE ? ? ? ?Scott Foley  MHD:[id number] DOB: 1966/02/15 DOA: 07/18/2021 ?PCP: [name], MD  ? ? ?Brief Narrative:  ? ?Scott Foley is a 56 year old male with past medical history significant for CAD, recurrent DVT/PE on Eliquis, HTN, COPD, history of CVA, ERCP pancreatitis with infected necrosis s/p drain placement by IR February 2023 with subsequent removal who presented to [facility] ED on 5/8 from home with complaint of abdominal pain and fever.  Reports 1 week history of right lower quadrant pain with fever up to 101.0 ?F.  On arrival to the ED, WBC count elevated 18, CT abdomen/pelvis with walled off right lower quadrant collection measuring up to 6.5 cm which is increased in size from previous study; also with a small tract extending from his right lower quadrant collection to the base of the cecum raising concern for possible fistulous communication.  General surgery was consulted.  Patient was started on broad-spectrum antibiotics.  Hospitalist service consulted for admission. ? ? ?Assessment & Plan: ?  ?Intra-abdominal fluid collection, recurrent ?Patient presenting to ED with progressive and recurrent abdominal pain localized to the right lower quadrant.  Patient with history of laparoscopic cholecystectomy December 2022 and developed post ERCP pancreatitis that developed into infected necrosis requiring IR drain placement February 2023.  Drain was subsequently removed and now CT abdomen/pelvis demonstrating recurrence of right lower quadrant collection measuring up to 6.5 cm.  Patient underwent drain placement by IR on 07/21/2021. ?--General surgery following, appreciate assistance ?--WBC 18.3>16.5>10.1>9.4>9.2 ?--Blood cultures x2: No growth x 4 days ?--Cefepime 2 g IV every 8 hours ?--Metronidazole 500 mg IV every 12 hours ?--Continue monitor drain output, 130 mL out past 24 hours ?--Flush drain w/ 46m saline q8h ?--CBC daily ? ?History of recurrent PE/DVT ?--Holding home Eliquis  for planned IR drain placement today ? ?Hypokalemia ?Repleted, potassium 3.4 this morning ?--Continue home potassium 20 mEq daily; will give additional 40 mill equivalent dose as well today ? ?Hyponatremia ?Sodium [id number] Likely secondary to poor oral intake.  Continue to encourage increased oral intake. ? ?CAD ?Essential hypertension ?Stable. ?--Holding home Plavix; will restart on discharge ?--Metoprolol tartrate 50 mg p.o. twice daily ?--Crestor 40 mg p.o. daily ? ?COPD ?Stable, on room air. ?--Breo Ellipta 1 puff daily ?--Incruse Ellipta 1 puff daily ?--Albuterol inhaler every 6 hours as needed shortness of breath/wheezing ? ?GERD: Protonix 40 mg p.o. twice daily ? ?Normocytic anemia ?Hemoglobin 9.1, stable. ? ?Physical deconditioning: ?Seen by PT and OT with no recommendations on discharge.  Continue to encourage increased ambulation while inpatient. ? ? ?DVT prophylaxis: SCDs Start: 07/18/21 2208 ? ?  Code Status: Full Code ?Family Communication: No family present at bedside this morning, updated patient's spouse DButch Pennyvia telephone this morning ? ?Disposition Plan:  ?Level of care: Med-Surg ?Status is: Inpatient ?Remains inpatient appropriate because: Anticipate discharge home tomorrow with outpatient follow-up with general surgery, IR drain clinic ?  ? ?Consultants:  ?General surgery ?Interventional radiology ? ?Procedures:  ?None ? ?Antimicrobials:  ?Cefepime 5/8>> ?Metronidazole 5/8>> ? ? ?Subjective: ?Patient seen and examined at bedside, resting comfortably.  Abdominal pain relatively resolved.  Drain placed yesterday.  Discussed with general surgery this morning with outpatient follow-up scheduled in 2 weeks with Dr. AZenia Resides  Updated patient's spouse via telephone that anticipate discharge likely tomorrow.  Spouse reports comfortable with drain management including flushing.  Spouse and patient appreciative all the efforts while he has been in the hospital.  No other specific questions or concerns  at this  time.  Denies headache, no visual changes, no fever/chills/night sweats, no nausea/vomiting/diarrhea, no chest pain, no palpitations, no shortness of breath, no weakness, no fatigue, no paresthesias.  No acute events overnight per nursing staff. ? ?Objective: ?Vitals:  ? 07/21/21 1836 07/21/21 2057 07/22/21 0417 07/22/21 1017  ?BP: (!) 138/96 (!) 150/98 130/80 (!) 143/85  ?Pulse: 85 84 71 71  ?Resp: '16 17 18 18  '$ ?Temp:  98 ?F (36.7 ?C) 98 ?F (36.7 ?C) (!) 97.5 ?F (36.4 ?C)  ?TempSrc:  Oral Oral Oral  ?SpO2: 94% 96% 96% 96%  ?Weight:      ?Height:      ? ? ?Intake/Output Summary (Last 24 hours) at 07/22/2021 1028 ?Last data filed at 07/22/2021 0900 ?Gross per 24 hour  ?Intake 1152.96 ml  ?Output 91 ml  ?Net 1061.96 ml  ? ?Filed Weights  ? 07/18/21 1747  ?Weight: 79 kg  ? ? ?Examination: ? ?Physical Exam: ?GEN: NAD, alert and oriented x 3,  ?HEENT: NCAT, PERRL, EOMI, sclera clear, MMM ?PULM: CTAB w/o wheezes/crackles, normal respiratory effort, on room air ?CV: RRR w/o M/G/R ?GI: abd soft, nondistended, mild tenderness to palpation in the right lower quadrant, NABS, no R/G/M, right lower quadrant drain noted ?MSK: no peripheral edema, muscle strength globally intact 5/5 bilateral upper/lower extremities ?NEURO: CN II-XII intact, no focal deficits, sensation to light touch intact ?PSYCH: normal mood/affect ?Integumentary: dry/intact, no rashes or wounds ? ? ? ?Data Reviewed: I have personally reviewed following labs and imaging studies ? ?CBC: ?Recent Labs  ?Lab 07/18/21 ?1807 07/19/21 ?0235 07/20/21 ?0145 07/21/21 ?0601 07/22/21 ?2297  ?WBC 18.3* 16.5* 10.1 9.4 9.2  ?NEUTROABS 13.4*  --   --   --   --   ?HGB 10.4* 9.6* 9.1* 10.3* 9.5*  ?HCT 33.0* 29.4* 29.5* 31.5* 30.8*  ?MCV 87.5 87.2 88.9 86.1 88.5  ?PLT 381 313 283 319 341  ? ?Basic Metabolic Panel: ?Recent Labs  ?Lab 07/18/21 ?1807 07/18/21 ?2122 07/19/21 ?0235 07/20/21 ?0145 07/21/21 ?0601 07/22/21 ?9892  ?NA 135  --  134* 134* 137 134*  ?K 3.0*  --  3.7  3.8 3.7 3.4*  ?CL 103  --  105 106 108 103  ?CO2 20*  --  20* '23 23 24  '$ ?GLUCOSE 111*  --  106* 99 102* 114*  ?BUN 18  --  '17 15 19 19  '$ ?CREATININE 1.24  --  1.18 1.06 1.16 1.54*  ?CALCIUM 9.0  --  8.7* 8.6* 9.0 8.7*  ?MG  --  1.7 2.0  --   --   --   ? ?GFR: ?Estimated Creatinine Clearance: 50.2 mL/min (A) (by C-G formula based on SCr of 1.54 mg/dL (H)). ?Liver Function Tests: ?Recent Labs  ?Lab 07/18/21 ?1807 07/19/21 ?0235 07/20/21 ?0145 07/21/21 ?0601  ?AST 11* 12* 10* 12*  ?ALT '11 10 10 12  '$ ?ALKPHOS 67 59 62 65  ?BILITOT 0.8 1.1 0.4 0.4  ?PROT 7.3 6.7 6.3* 6.8  ?ALBUMIN 3.2* 2.8* 2.5* 2.4*  ? ?Recent Labs  ?Lab 07/18/21 ?1807  ?LIPASE 30  ? ?No results for input(s): AMMONIA in the last 168 hours. ?Coagulation Profile: ?Recent Labs  ?Lab 07/21/21 ?0601  ?INR 1.2  ? ?Cardiac Enzymes: ?No results for input(s): CKTOTAL, CKMB, CKMBINDEX, TROPONINI in the last 168 hours. ?BNP (last 3 results) ?No results for input(s): PROBNP in the last 8760 hours. ?HbA1C: ?No results for input(s): HGBA1C in the last 72 hours. ?CBG: ?No results for input(s): GLUCAP in the last 168 hours. ?Lipid  Profile: ?No results for input(s): CHOL, HDL, LDLCALC, TRIG, CHOLHDL, LDLDIRECT in the last 72 hours. ?Thyroid Function Tests: ?No results for input(s): TSH, T4TOTAL, FREET4, T3FREE, THYROIDAB in the last 72 hours. ?Anemia Panel: ?No results for input(s): VITAMINB12, FOLATE, FERRITIN, TIBC, IRON, RETICCTPCT in the last 72 hours. ?Sepsis Labs: ?No results for input(s): PROCALCITON, LATICACIDVEN in the last 168 hours. ? ?Recent Results (from the past 240 hour(s))  ?Blood culture (routine x 2)     Status: None (Preliminary result)  ? Collection Time: 07/18/21  9:22 PM  ? Specimen: BLOOD  ?Result Value Ref Range Status  ? Specimen Description BLOOD LEFT ANTECUBITAL  Final  ? Special Requests   Final  ?  BOTTLES DRAWN AEROBIC AND ANAEROBIC Blood Culture adequate volume  ? Culture   Final  ?  NO GROWTH 4 DAYS ?Performed at [facility],  [place] [address], [place], [place] [address] ?  ? Report Status PENDING  Incomplete  ?Blood culture (routine x 2)     Status: None (Preliminary result)  ? Collection Time: 07/19/21  9:35 AM  ? Specimen: BLOOD  ?Re

## 2021-07-22 NOTE — Progress Notes (Signed)
Pharmacy Antibiotic Note ? ?Scott Foley is a 56 y.o. male admitted on 07/18/2021 with  intra-abdominal abscess secondary to necrotizing pancreatitis .  Pharmacy has been consulted for Cefepime dosing, also on Flagyl. ? ?SCr bumped up to 1.54, WBC down to normal. IR to placed perc drain on 5/11. Abscess culture growing few Strep anginosis.  ? ?Plan: ?-Decrease cefepime to 2 g IV q12h ?-Monitor CBC, renal fx, cultures and clinical progress ? ?Height: '5\' 4"'$  (162.6 cm) ?Weight: 79 kg (174 lb 2.6 oz) ?IBW/kg (Calculated) : 59.2 ? ?Temp (24hrs), Avg:97.8 ?F (36.6 ?C), Min:97.5 ?F (36.4 ?C), Max:98 ?F (36.7 ?C) ? ?Recent Labs  ?Lab 07/18/21 ?1807 07/19/21 ?0235 07/20/21 ?0145 07/21/21 ?0601 07/22/21 ?7846  ?WBC 18.3* 16.5* 10.1 9.4 9.2  ?CREATININE 1.24 1.18 1.06 1.16 1.54*  ? ?  ?Estimated Creatinine Clearance: 50.2 mL/min (A) (by C-G formula based on SCr of 1.54 mg/dL (H)).   ? ?Allergies  ?Allergen Reactions  ? Penicillin G Hives  ? ? ?Antimicrobials this admission: ?Cefepime 5/8 >>  ?Flagyl 5/8 >>  ? ?Dose adjustments this admission: ? ? ?Microbiology results: ?5/8 BCx: ngtd ?5/9 BCx: ngtd ?5/11 Abscess: few Strep anginosis ? ?Thank you for involving pharmacy in this patient's care. ? ?Renold Genta, PharmD, BCPS ?Clinical Pharmacist ?Clinical phone for 07/22/2021 until 3p is [phone] ?07/22/2021 2:18 PM ? ?**Pharmacist phone directory can be found on [email or web address] listed under [place]** ? ?

## 2021-07-22 NOTE — Consult Note (Signed)
? ?  [facility] CM Inpatient Consult ? ? ?07/22/2021 ? ?Scott Foley ?1965/06/14 ?[id number] ? ?[facility] [ACO] Patient: [name] ? ?Primary Care Provider:  [name], MD ?[name], is an embedded provider with a Chronic Care Management team and program, and is listed for the transition of care follow up and appointments. ? ?Patient was screened for Embedded practice service needs for chronic care management and shows that the patient has an active pharmacist with Embedded CCM program noted. ? ?Plan: Continue to follow for  TOC needs.  ?Please contact for further questions, ? ?[name], RN BSN CCM ?[facility] Liaison ? [phone] business mobile phone ?Toll free office [phone]  ?Fax number: [phone] ?[email or web address] ? ? ? ?

## 2021-07-22 NOTE — Progress Notes (Signed)
? ? ?   ?Subjective: ?CC: ?Stable right lower abdominal pain, greatest around the drain. Tolerating diet without n/v. Passing flatus. BM today.  ?Afebrile. WBC wnl.  ?Cr up at 1.54 ? ?Objective: ?Vital signs in last 24 hours: ?Temp:  [97.5 ?F (36.4 ?C)-98 ?F (36.7 ?C)] 97.5 ?F (36.4 ?C) (05/12 1017) ?Pulse Rate:  [71-85] 71 (05/12 1017) ?Resp:  [16-18] 18 (05/12 1017) ?BP: (130-150)/(80-98) 143/85 (05/12 1017) ?SpO2:  [94 %-96 %] 96 % (05/12 1017) ?Last BM Date : 07/22/21 ? ?Intake/Output from previous day: ?05/11 0701 - 05/12 0700 ?In: 440 [P.O.:480; IV Piggyback:343] ?Out: 131 [Drains:130; Stool:1] ?Intake/Output this shift: ?Total I/O ?In: 320 [P.O.:320] ?Out: -  ? ?PE: ?Gen:  Alert, NAD, pleasant ?Abd: soft, mild tenderness of the RLQ, ND, +BS.  RLQ JP Drain with scant serosang fluid in the bulb currently. 130cc documented out in the last 24 hours.  ? ?Lab Results:  ?Recent Labs  ?  07/21/21 ?0601 07/22/21 ?3474  ?WBC 9.4 9.2  ?HGB 10.3* 9.5*  ?HCT 31.5* 30.8*  ?PLT 319 341  ? ?BMET ?Recent Labs  ?  07/21/21 ?0601 07/22/21 ?2595  ?NA 137 134*  ?K 3.7 3.4*  ?CL 108 103  ?CO2 23 24  ?GLUCOSE 102* 114*  ?BUN 19 19  ?CREATININE 1.16 1.54*  ?CALCIUM 9.0 8.7*  ? ?PT/INR ?Recent Labs  ?  07/21/21 ?0601  ?LABPROT 14.6  ?INR 1.2  ? ?CMP  ?   ?Component Value Date/Time  ? NA 134 (L) 07/22/2021 0152  ? NA 138 07/04/2021 0943  ? K 3.4 (L) 07/22/2021 0152  ? CL 103 07/22/2021 0152  ? CO2 24 07/22/2021 0152  ? GLUCOSE 114 (H) 07/22/2021 0152  ? BUN 19 07/22/2021 0152  ? BUN 20 07/04/2021 0943  ? CREATININE 1.54 (H) 07/22/2021 0152  ? CALCIUM 8.7 (L) 07/22/2021 0152  ? PROT 6.8 07/21/2021 0601  ? PROT 7.5 07/04/2021 0943  ? ALBUMIN 2.4 (L) 07/21/2021 0601  ? ALBUMIN 4.1 07/04/2021 0943  ? AST 12 (L) 07/21/2021 0601  ? ALT 12 07/21/2021 0601  ? ALKPHOS 65 07/21/2021 0601  ? BILITOT 0.4 07/21/2021 0601  ? BILITOT 0.4 07/04/2021 0943  ? GFRNONAA 52 (L) 07/22/2021 0152  ? GFRAA 105 12/15/2019 1034  ? ?Lipase  ?   ?Component Value  Date/Time  ? LIPASE 30 07/18/2021 1807  ? ? ?Studies/Results: ?CT IMAGE GUIDED DRAINAGE BY PERCUTANEOUS CATHETER ? ?Result Date: 07/21/2021 ?INDICATION: 56 year old male with recurrent pancreatitis and right lower quadrant abscess formation. He presents for repeat drain placement. EXAM: CT-guided drain placement MEDICATIONS: The patient is currently admitted to the hospital and receiving intravenous antibiotics. The antibiotics were administered within an appropriate time frame prior to the initiation of the procedure. ANESTHESIA/SEDATION: Moderate (conscious) sedation was employed during this procedure. A total of Versed 1.5 mg and Fentanyl 75 mcg was administered intravenously by the radiology nurse. Total intra-service moderate Sedation Time: 16 minutes. The patient's level of consciousness and vital signs were monitored continuously by radiology nursing throughout the procedure under my direct supervision. COMPLICATIONS: None immediate. PROCEDURE: Informed written consent was obtained from the patient after a thorough discussion of the procedural risks, benefits and alternatives. All questions were addressed. Maximal Sterile Barrier Technique was utilized including caps, mask, sterile gowns, sterile gloves, sterile drape, hand hygiene and skin antiseptic. A timeout was performed prior to the initiation of the procedure. A planning axial CT scan was performed. The fluid collection in the right lower quadrant retroperitoneal space  was easily identified. A suitable skin entry site was selected and marked. The skin was sterilely prepped and draped in the standard fashion using chlorhexidine skin prep. Local anesthesia was attained by infiltration with 1% lidocaine. A small dermatotomy was made. Under intermittent CT guidance, an 18 gauge trocar needle was advanced into the fluid collection. A 0.035 wire was then coiled in the fluid collection. The trocar needle was removed. The percutaneous tract was dilated to 12  [place]. A 12 French all-purpose drainage catheter was advanced over the wire and formed. Aspiration yields approximately 60 mL of thick purulent and mildly bloody fluid. A sample was sent for Gram stain and culture. The abscess cavity was then lavaged with sterile saline and the drain connected to JP bulb. Post placement CT imaging demonstrates a drainage catheter to be in a good position. However, there was slight kinking of the catheter tubing. Therefore, the catheter tubing was manipulated and reimaged demonstrating the catheter still well within the abscess cavity and the kink resolved. The drain was secured to the skin with 0 silk suture and bandages were applied. IMPRESSION: Successful placement of a 12 French drainage catheter into the right lower quadrant retroperitoneal abscess with aspiration of 60 mL frankly purulent fluid. Samples were sent for Gram stain and culture. PLAN: Maintain drain to JP bulb suction. Flush at least once daily with 5-10 mL saline Recommend repeat CT imaging prior to drain removal. Signed, Criselda Peaches, MD, RPVI [facility] Radiology Electronically Signed   By: Jacqulynn Cadet M.D.   On: 07/21/2021 13:27   ? ?Anti-infectives: ?Anti-infectives (From admission, onward)  ? ? Start     Dose/Rate Route Frequency Ordered Stop  ? 07/19/21 0600  ceFEPIme (MAXIPIME) 2 g in sodium chloride 0.9 % 100 mL IVPB       ? 2 g ?200 mL/hr over 30 Minutes Intravenous Every 8 hours 07/18/21 2217    ? 07/18/21 2100  ceFEPIme (MAXIPIME) 2 g in sodium chloride 0.9 % 100 mL IVPB       ? 2 g ?200 mL/hr over 30 Minutes Intravenous  Once 07/18/21 2053 07/18/21 2219  ? 07/18/21 2100  metroNIDAZOLE (FLAGYL) IVPB 500 mg       ? 500 mg ?100 mL/hr over 60 Minutes Intravenous Every 12 hours 07/18/21 2053    ? ?  ? ? ? ?Assessment/Plan ?Recurrent RLQ fluid collection with other small intra-abdominal fluid collections ?Post-ERCP pancreatitis, developed into  infected necrosis requiring IR drain in 2/23 ?- s/p lap chole 03/02/21 by Dr. Lilia Pro with abnormal IOC >> underwent ERCP 03/03/21 ?- Patient has redeveloped another fluid collection in the RLQ.  Unclear if he has a duct that is leaking or a fistula that is causing this reformation.   ?- Drain placed 5/11.  7days abx after drain placement.  ?- Follow up on cultures ?- Sent drain fluid for amylase to see if there may be a pancreatic leak ?- Can restart eliquis and plavix  ?- Follow up with Dr. Zenia Resides in 2 weeks, arranged ?- [place] for d/c from our standpoint. TRH keeping for bump in Cr. Will follow.  ?  ?FEN - HH diet ?VTE - okay for eliquis and plavix, SCDs ?ID - cefepime/Flagyl ?  ?AKI - TRH starting on IVF. Going to monitor overnight and repeat labs in am ?COPD ?CAD ?HTN ?HLD ? ? LOS: 4 days  ? ? ?Jillyn Ledger , PA-C ?[facility] ?07/22/2021, 11:26 AM ?Please see  Amion for pager number during day hours 7:00am-4:30pm ? ?

## 2021-07-23 DIAGNOSIS — R188 Other ascites: Secondary | ICD-10-CM | POA: Diagnosis not present

## 2021-07-23 LAB — BASIC METABOLIC PANEL
Anion gap: 7 (ref 5–15)
BUN: 19 mg/dL (ref 6–20)
CO2: 20 mmol/L — ABNORMAL LOW (ref 22–32)
Calcium: 8.7 mg/dL — ABNORMAL LOW (ref 8.9–10.3)
Chloride: 106 mmol/L (ref 98–111)
Creatinine, Ser: 0.98 mg/dL (ref 0.61–1.24)
GFR, Estimated: >60 mL/min (ref >60)
Glucose, Bld: 101 mg/dL — ABNORMAL HIGH (ref 70–99)
Potassium: 3.5 mmol/L (ref 3.5–5.1)
Sodium: 133 mmol/L — ABNORMAL LOW (ref 135–145)

## 2021-07-23 LAB — CBC
HCT: 31.2 % — ABNORMAL LOW (ref 39.0–52.0)
Hemoglobin: 9.9 g/dL — ABNORMAL LOW (ref 13.0–17.0)
MCH: 27.3 pg (ref 26.0–34.0)
MCHC: 31.7 g/dL (ref 30.0–36.0)
MCV: 86.2 fL (ref 80.0–100.0)
Platelets: 319 10*3/uL (ref 150–400)
RBC: 3.62 MIL/uL — ABNORMAL LOW (ref 4.22–5.81)
RDW: 16.1 % — ABNORMAL HIGH (ref 11.5–15.5)
WBC: 9.5 10*3/uL (ref 4.0–10.5)
nRBC: 0 % (ref 0.0–0.2)

## 2021-07-23 LAB — CULTURE, BLOOD (ROUTINE X 2)
Culture: NO GROWTH
Special Requests: ADEQUATE

## 2021-07-23 MED ORDER — CIPROFLOXACIN HCL 500 MG PO TABS: 500.0000 mg | ORAL_TABLET | Freq: Two times a day (BID) | ORAL | 0 refills | Status: AC

## 2021-07-23 MED ORDER — DOXYCYCLINE HYCLATE 100 MG PO CAPS: 100.0000 mg | ORAL_CAPSULE | Freq: Two times a day (BID) | ORAL | 0 refills | Status: AC

## 2021-07-23 MED ORDER — SODIUM CHLORIDE 0.9 % IJ SOLN: 5.0000 mL | Freq: Two times a day (BID) | INTRAMUSCULAR | 2 refills | Status: AC

## 2021-07-23 MED ORDER — METRONIDAZOLE 500 MG PO TABS: 500.0000 mg | ORAL_TABLET | Freq: Two times a day (BID) | ORAL | 0 refills | Status: AC

## 2021-07-23 MED ORDER — DOXYCYCLINE HYCLATE 100 MG PO CAPS: 100.0000 mg | ORAL_CAPSULE | Freq: Two times a day (BID) | ORAL | 0 refills | Status: DC

## 2021-07-23 MED ORDER — SODIUM CHLORIDE 0.9 % IJ SOLN: 5.0000 mL | Freq: Two times a day (BID) | INTRAMUSCULAR | 2 refills | Status: DC

## 2021-07-23 MED ORDER — METRONIDAZOLE 500 MG PO TABS: 500.0000 mg | ORAL_TABLET | Freq: Two times a day (BID) | ORAL | 0 refills | Status: DC

## 2021-07-23 MED ORDER — CIPROFLOXACIN HCL 500 MG PO TABS: 500.0000 mg | ORAL_TABLET | Freq: Two times a day (BID) | ORAL | 0 refills | Status: DC

## 2021-07-23 NOTE — TOC Transition Note (Signed)
Transition of Care (TOC) - CM/SW Discharge Note ? ? ?Patient Details  ?Name: Scott Foley ?MRN: [id number] ?Date of Birth: Jul 13, 1965 ? ?Transition of Care (TOC) CM/SW Contact:  ?Bartholomew Crews, RN ?Phone Number: [phone] ?07/23/2021, 11:28 AM ? ? ?Clinical Narrative:    ? ?Patient to transition home today. Chart reviewed. No TOC needs identified at this time.  ? ?Final next level of care: Home/Self Care ?Barriers to Discharge: No Barriers Identified ? ? ?Patient Goals and CMS Choice ?  ?  ?  ? ?Discharge Placement ?  ?           ?  ?  ?  ?  ? ?Discharge Plan and Services ?  ?  ?           ?  ?  ?  ?  ?  ?  ?  ?  ?  ?  ? ?Social Determinants of Health (SDOH) Interventions ?  ? ? ?Readmission Risk Interventions ?   ? View : No data to display.  ?  ?  ?  ? ? ? ? ? ?

## 2021-07-23 NOTE — Progress Notes (Signed)
Discharge instructions given. Prescriptions sent to [facility] in [place] by MD per pt request. ?Reviewed JP drain maintenance and care. Both pt and wife at bedside verbalized understanding. Provided pt with NS flushes for flushing drain at home. ?Pt transported off unit via [place] with all belongings on self. He remains A&O and stable at baseline.  ?

## 2021-07-23 NOTE — Progress Notes (Signed)
If patient is to be discharged, below are discharge instructions: ?- Flush each drain once daily with 5-10 cc NS flush (patient will need an order for flushes upon discharge). RN aware to teach patient how to manage drains at home. ?- Record output from each drain once daily. ?- Follow-up at drain clinic 10-14 days after discharge for CT/possible drain injection (assess for possible drain removal)- IR schedulers to call patient to set up this appointment.  ? ?Soyla Dryer, AGACNP-BC ?[phone] ?07/23/2021, 11:14 AM ? ? ?

## 2021-07-23 NOTE — Discharge Summary (Signed)
?Physician Discharge Summary  ?Scott Foley IEP:[id number] DOB: [birth year] DOA: 07/18/2021 ? ?PCP: [name], MD ? ?Admit date: 07/18/2021 ?Discharge date: 07/23/2021 ? ?Admitted From: Home ?Disposition: Home ? ?Recommendations for Outpatient Follow-up:  ?Follow up with PCP in 1-2 weeks ?Follow-up with general surgery, Dr. Zenia Resides as scheduled on 08/05/2021 at 6 AM ?Follow-up with interventional radiology drain clinic, clinic will call to schedule appointment within the next 2 weeks ?Continue doxycycline/Cipro/Flagyl for additional 7 days ?Please follow up on the following pending results: Follow-up finalized drain culture from 5/11 that was pending at time of discharge ? ?Home Health: No ?Equipment/Devices: Abdominal drain ? ?Discharge Condition: Stable ?CODE STATUS: Full code ?Diet recommendation:  ? ?History of present illness: ? ?Scott Foley is a 56 year old male with past medical history significant for CAD, recurrent DVT/PE on Eliquis, HTN, COPD, history of CVA, ERCP pancreatitis with infected necrosis s/p drain placement by IR February 2023 with subsequent removal who presented to [facility] ED on 5/8 from home with complaint of abdominal pain and fever.  Reports 1 week history of right lower quadrant pain with fever up to 101.0 ?F.  On arrival to the ED, WBC count elevated 18, CT abdomen/pelvis with walled off right lower quadrant collection measuring up to 6.5 cm which is increased in size from previous study; also with a small tract extending from his right lower quadrant collection to the base of the cecum raising concern for possible fistulous communication.  General surgery was consulted.  Patient was started on broad-spectrum antibiotics.  Hospitalist service consulted for admission. ? ?Hospital course: ? ?Intra-abdominal fluid collection, recurrent ?Patient presenting to ED with progressive and recurrent abdominal pain localized to the right lower quadrant.  Patient with history of laparoscopic  cholecystectomy December 2022 and developed post ERCP pancreatitis that developed into infected necrosis requiring IR drain placement February 2023.  Drain was subsequently removed and now CT abdomen/pelvis demonstrating recurrence of right lower quadrant collection measuring up to 6.5 cm.  Patient was started on empiric antibiotics with cefepime and metronidazole.  Patient underwent drain placement by IR on 07/21/2021.  Patient's WBC count improved from 18.3-9.5 at time of discharge.  Drain culture showing few Streptococcus anginosus.  Will discharge on doxycycline/Cipro/Flagyl for 7 additional days now that we have source control.  Outpatient follow-up scheduled with Dr. Zenia Resides on 08/05/2021.  Will need close follow-up with IR drain clinic on discharge as well.  Continue to flush drain with 5 mL saline every 12 hours. ?  ?History of recurrent PE/DVT ?Continue Eliquis ?  ?Hypokalemia ?Continue home potassium 20 mEq daily ?  ?Hyponatremia ?Etiology likely secondary to poor oral intake.   ?  ?CAD ?Essential hypertension ?Stable.  Continue Plavix, metoprolol tartrate 50 mg p.o. twice daily, Crestor 40 mg p.o. daily. ?  ?COPD ?Stable, on room air.  Continue Trelegy Ellipta inhaler ?  ?GERD: Protonix 40 mg p.o. twice daily ?  ?Normocytic anemia ?Hemoglobin 9.1, stable. ?  ?Physical deconditioning: ?Seen by PT and OT with no recommendations on discharge.  Continue to encourage increased ambulation while inpatient. ? ?Discharge Diagnoses:  ?Principal Problem: ?  Intra-abdominal fluid collection ?Active Problems: ?  History of CVA (cerebrovascular accident) ?  Chronic obstructive pulmonary disease (HCC) ?  Coronary artery disease due to lipid rich plaque ?  Essential hypertension ?  History of pulmonary embolism ?  Hypokalemia ?  Normocytic anemia ? ? ? ?Discharge Instructions ? ?Discharge Instructions   ? ? Diet - low sodium heart healthy  Complete by: As directed ?  ? Discharge wound care:   Complete by: As directed ?  ?  Continue to flush drain twice daily with 5 mL of normal saline  ? Increase activity slowly   Complete by: As directed ?  ? ?  ? ?Allergies as of 07/23/2021   ? ?   Reactions  ? Penicillin G Hives  ? ?  ? ?  ?Medication List  ?  ? ?TAKE these medications   ? ?albuterol 108 (90 Base) MCG/ACT inhaler ?Commonly known as: VENTOLIN HFA ?Inhale 2 puffs into the lungs every 6 (six) hours as needed for wheezing or shortness of breath. ?  ?apixaban 5 MG Tabs tablet ?Commonly known as: Eliquis ?Take 1 tablet (5 mg total) by mouth 2 (two) times daily. ?  ?ciprofloxacin 500 MG tablet ?Commonly known as: Cipro ?Take 1 tablet (500 mg total) by mouth 2 (two) times daily for 7 days. ?  ?clopidogrel 75 MG tablet ?Commonly known as: PLAVIX ?Take 1 tablet (75 mg total) by mouth daily. ?  ?Fish Oil 1000 MG Caps ?Take 1 capsule (1,000 mg total) by mouth 2 (two) times daily. ?  ?metoprolol tartrate 50 MG tablet ?Commonly known as: LOPRESSOR ?Take 1 tablet (50 mg total) by mouth 2 (two) times daily. ?  ?metroNIDAZOLE 500 MG tablet ?Commonly known as: Flagyl ?Take 1 tablet (500 mg total) by mouth 2 (two) times daily for 7 days. ?  ?multivitamin with minerals Tabs tablet ?Take 1 tablet by mouth daily. ?  ?pantoprazole 40 MG tablet ?Commonly known as: PROTONIX ?TAKE ONE TABLET BY MOUTH EVERY MORNING and TAKE ONE TABLET BY MOUTH EVERY EVENING ?What changed: See the new instructions. ?  ?potassium chloride SA 20 MEQ tablet ?Commonly known as: KLOR-CON M ?Take 1 tablet (20 mEq total) by mouth daily. ?  ?Repatha 140 MG/ML Sosy ?Generic drug: Evolocumab ?Inject 140 mg into the skin every 14 (fourteen) days. ?  ?rosuvastatin 40 MG tablet ?Commonly known as: Crestor ?Take 1 tablet (40 mg total) by mouth at bedtime. ?  ?sodium chloride 0.9 % injection ?Inject 5 mLs into the vein every 12 (twelve) hours. Flush abdominal drain with 5 mL normal saline every 12 hours ?  ?Trelegy Ellipta 100-62.5-25 MCG/ACT Aepb ?Generic drug:  Fluticasone-Umeclidin-Vilant ?Inhale 1 puff into the lungs daily. ?  ?vitamin B-12 1000 MCG tablet ?Commonly known as: CYANOCOBALAMIN ?TAKE ONE TABLET BY MOUTH EVERY MORNING ?What changed: when to take this ?  ? ?  ? ?  ?  ? ? ?  ?Discharge Care Instructions  ?(From admission, onward)  ?  ? ? ?  ? ?  Start     Ordered  ? 07/23/21 0000  Discharge wound care:       ?Comments: Continue to flush drain twice daily with 5 mL of normal saline  ? 07/23/21 1057  ? ?  ?  ? ?  ? ? Follow-up Information   ? ? Dwan Bolt, MD Follow up on 08/05/2021.   ?Specialty: General Surgery ?Why: 11:10 am, arrive by 10:55am for check in process ?Contact information: ?[place] [id number] ?[phone] ? ? ?  ?  ? ? [name], [name], MD. Schedule an appointment as soon as possible for a visit in 1 week(s).   ?Specialties: Internal Medicine, Interventional Cardiology, Radiology, Anesthesiology ?Contact information: ?[address] ?[address] ?[place] [address] ?[phone] ? ? ?  ?  ? ?  ?  ? ?  ? ?  Allergies  ?Allergen Reactions  ? Penicillin G Hives  ? ? ?Consultations: ?General surgery ?Interventional radiology ? ? ?Procedures/Studies: ?DG Chest 2 View ? ?Result Date: 07/18/2021 ?CLINICAL DATA:  Concern for sepsis. Patient reports right-sided abdominal pain. Fever. EXAM: CHEST - 2 VIEW COMPARISON:  Chest radiograph 05/01/2021 FINDINGS: Decreased cardiomegaly from prior exam. Aortic tortuosity. Subsegmental atelectasis in the lung bases, left greater than right. No pleural effusion, pulmonary edema, or pneumothorax. On limited assessment, no acute osseous abnormalities are seen. IMPRESSION: Subsegmental atelectasis in the lung bases, left greater than right. No other acute findings. Electronically Signed   By: Keith Rake M.D.   On: 07/18/2021 19:52  ? ?CT ABDOMEN PELVIS W CONTRAST ? ?Result Date: 07/18/2021 ?CLINICAL DATA:  Pancreatitis.  Abdominal pain, fever EXAM: CT ABDOMEN AND PELVIS WITH CONTRAST  TECHNIQUE: Multidetector CT imaging of the abdomen and pelvis was performed using the standard protocol following bolus administration of intravenous contrast. RADIATION DOSE REDUCTION: This exam was performed according to the departmenta

## 2021-07-23 NOTE — Progress Notes (Signed)
Patient ID: Scott Foley, male   DOB: December 15, 1965, 56 y.o.   MRN: [id number] ?[facility] Progress Note:   * No surgery found *  ?Subjective: ?Mental status is clear.  Complaints no new complaints. ?Objective: ?Vital signs in last 24 hours: ?Temp:  [97.5 ?F (36.4 ?C)-98.1 ?F (36.7 ?C)] 98.1 ?F (36.7 ?C) (05/13 2831) ?Pulse Rate:  [71-78] 71 (05/13 0521) ?Resp:  [16-18] 18 (05/13 0521) ?BP: (143-150)/(85-89) 145/86 (05/13 0521) ?SpO2:  [95 %-97 %] 95 % (05/13 0521) ? ?Intake/Output from previous day: ?05/12 0701 - 05/13 0700 ?In: 3157.3 [P.O.:1440; I.V.:1412.3; IV Piggyback:300] ?Out: 20 [Drains:20] ?Intake/Output this shift: ?No intake/output data recorded. ? ?Physical Exam: Work of breathing is not increased.  Drain on the right side.   ? ?Lab Results:  ?Results for orders placed or performed during the hospital encounter of 07/18/21 (from the past 48 hour(s))  ?Aerobic/Anaerobic Culture w Gram Stain (surgical/deep wound)     Status: None (Preliminary result)  ? Collection Time: 07/21/21 11:55 AM  ? Specimen: Abscess  ?Result Value Ref Range  ? Specimen Description ABSCESS   ? Special Requests Normal   ? Gram Stain    ?  ABUNDANT WBC PRESENT, PREDOMINANTLY PMN ?RARE GRAM POSITIVE COCCI IN CHAINS ?  ? Culture    ?  FEW STREPTOCOCCUS ANGINOSIS ?SUSCEPTIBILITIES TO FOLLOW ?Performed at [facility], [place] [address] [place], [place] [address] ?  ? Report Status PENDING   ?CBC     Status: Abnormal  ? Collection Time: 07/22/21  1:52 AM  ?Result Value Ref Range  ? WBC 9.2 4.0 - 10.5 K/uL  ? RBC 3.48 (L) 4.22 - 5.81 MIL/uL  ? Hemoglobin 9.5 (L) 13.0 - 17.0 g/dL  ? HCT 30.8 (L) 39.0 - 52.0 %  ? MCV 88.5 80.0 - 100.0 fL  ? MCH 27.3 26.0 - 34.0 pg  ? MCHC 30.8 30.0 - 36.0 g/dL  ? RDW 16.3 (H) 11.5 - 15.5 %  ? Platelets 341 150 - 400 K/uL  ? nRBC 0.0 0.0 - 0.2 %  ?  Comment: Performed at [facility], [place] [address], [place], [place] [address]  ?Basic metabolic panel     Status: Abnormal  ?  Collection Time: 07/22/21  1:52 AM  ?Result Value Ref Range  ? Sodium 134 (L) 135 - 145 mmol/L  ? Potassium 3.4 (L) 3.5 - 5.1 mmol/L  ? Chloride 103 98 - 111 mmol/L  ? CO2 24 22 - 32 mmol/L  ? Glucose, Bld 114 (H) 70 - 99 mg/dL  ?  Comment: Glucose reference range applies only to samples taken after fasting for at least 8 hours.  ? BUN 19 6 - 20 mg/dL  ? Creatinine, Ser 1.54 (H) 0.61 - 1.24 mg/dL  ? Calcium 8.7 (L) 8.9 - 10.3 mg/dL  ? GFR, Estimated 52 (L) >60 mL/min  ?  Comment: (NOTE) ?Calculated using the CKD-EPI Creatinine Equation (2021) ?  ? Anion gap 7 5 - 15  ?  Comment: Performed at [facility], [place] [address] [place], [place] [address]  ?CBC     Status: Abnormal  ? Collection Time: 07/23/21  1:54 AM  ?Result Value Ref Range  ? WBC 9.5 4.0 - 10.5 K/uL  ? RBC 3.62 (L) 4.22 - 5.81 MIL/uL  ? Hemoglobin 9.9 (L) 13.0 - 17.0 g/dL  ? HCT 31.2 (L) 39.0 - 52.0 %  ? MCV 86.2 80.0 - 100.0 fL  ? MCH  27.3 26.0 - 34.0 pg  ? MCHC 31.7 30.0 - 36.0 g/dL  ? RDW 16.1 (H) 11.5 - 15.5 %  ? Platelets 319 150 - 400 K/uL  ? nRBC 0.0 0.0 - 0.2 %  ?  Comment: Performed at [facility], [place] [address], [place], [place] [address]  ?Basic metabolic panel     Status: Abnormal  ? Collection Time: 07/23/21  1:54 AM  ?Result Value Ref Range  ? Sodium 133 (L) 135 - 145 mmol/L  ? Potassium 3.5 3.5 - 5.1 mmol/L  ? Chloride 106 98 - 111 mmol/L  ? CO2 20 (L) 22 - 32 mmol/L  ? Glucose, Bld 101 (H) 70 - 99 mg/dL  ?  Comment: Glucose reference range applies only to samples taken after fasting for at least 8 hours.  ? BUN 19 6 - 20 mg/dL  ? Creatinine, Ser 0.98 0.61 - 1.24 mg/dL  ? Calcium 8.7 (L) 8.9 - 10.3 mg/dL  ? GFR, Estimated >60 >60 mL/min  ?  Comment: (NOTE) ?Calculated using the CKD-EPI Creatinine Equation (2021) ?  ? Anion gap 7 5 - 15  ?  Comment: Performed at [facility], [place] [address] [place], [place] [address]  ? ? ?Radiology/Results: ?CT IMAGE GUIDED DRAINAGE BY PERCUTANEOUS CATHETER ? ?Result Date:  07/21/2021 ?INDICATION: 56 year old male with recurrent pancreatitis and right lower quadrant abscess formation. He presents for repeat drain placement. EXAM: CT-guided drain placement MEDICATIONS: The patient is currently admitted to the hospital and receiving intravenous antibiotics. The antibiotics were administered within an appropriate time frame prior to the initiation of the procedure. ANESTHESIA/SEDATION: Moderate (conscious) sedation was employed during this procedure. A total of Versed 1.5 mg and Fentanyl 75 mcg was administered intravenously by the radiology nurse. Total intra-service moderate Sedation Time: 16 minutes. The patient's level of consciousness and vital signs were monitored continuously by radiology nursing throughout the procedure under my direct supervision. COMPLICATIONS: None immediate. PROCEDURE: Informed written consent was obtained from the patient after a thorough discussion of the procedural risks, benefits and alternatives. All questions were addressed. Maximal Sterile Barrier Technique was utilized including caps, mask, sterile gowns, sterile gloves, sterile drape, hand hygiene and skin antiseptic. A timeout was performed prior to the initiation of the procedure. A planning axial CT scan was performed. The fluid collection in the right lower quadrant retroperitoneal space was easily identified. A suitable skin entry site was selected and marked. The skin was sterilely prepped and draped in the standard fashion using chlorhexidine skin prep. Local anesthesia was attained by infiltration with 1% lidocaine. A small dermatotomy was made. Under intermittent CT guidance, an 18 gauge trocar needle was advanced into the fluid collection. A 0.035 wire was then coiled in the fluid collection. The trocar needle was removed. The percutaneous tract was dilated to 12 [place]. A 12 French all-purpose drainage catheter was advanced over the wire and formed. Aspiration yields approximately 60 mL of  thick purulent and mildly bloody fluid. A sample was sent for Gram stain and culture. The abscess cavity was then lavaged with sterile saline and the drain connected to JP bulb. Post placement CT imaging demonstrates a drainage catheter to be in a good position. However, there was slight kinking of the catheter tubing. Therefore, the catheter tubing was manipulated and reimaged demonstrating the catheter still well within the abscess cavity and the kink resolved. The drain was secured to the skin with 0 silk suture and bandages were applied. IMPRESSION: Successful placement  of a 12 French drainage catheter into the right lower quadrant retroperitoneal abscess with aspiration of 60 mL frankly purulent fluid. Samples were sent for Gram stain and culture. PLAN: Maintain drain to JP bulb suction. Flush at least once daily with 5-10 mL saline Recommend repeat CT imaging prior to drain removal. Signed, Criselda Peaches, MD, RPVI [facility] Radiology Electronically Signed   By: Jacqulynn Cadet M.D.   On: 07/21/2021 13:27   ? ?Anti-infectives: ?Anti-infectives (From admission, onward)  ? ? Start     Dose/Rate Route Frequency Ordered Stop  ? 07/22/21 2200  ceFEPIme (MAXIPIME) 2 g in sodium chloride 0.9 % 100 mL IVPB       ? 2 g ?200 mL/hr over 30 Minutes Intravenous Every 12 hours 07/22/21 1525    ? 07/19/21 0600  ceFEPIme (MAXIPIME) 2 g in sodium chloride 0.9 % 100 mL IVPB  Status:  Discontinued       ? 2 g ?200 mL/hr over 30 Minutes Intravenous Every 8 hours 07/18/21 2217 07/22/21 1525  ? 07/18/21 2100  ceFEPIme (MAXIPIME) 2 g in sodium chloride 0.9 % 100 mL IVPB       ? 2 g ?200 mL/hr over 30 Minutes Intravenous  Once 07/18/21 2053 07/18/21 2219  ? 07/18/21 2100  metroNIDAZOLE (FLAGYL) IVPB 500 mg       ? 500 mg ?100 mL/hr over 60 Minutes Intravenous Every 12 hours 07/18/21 2053    ? ?  ? ? ?Assessment/Plan: ?Problem List: ?Patient Active Problem List  ? Diagnosis  Date Noted  ? Intra-abdominal fluid collection 07/18/2021  ? B12 deficiency 07/04/2021  ? CKD (chronic kidney disease), stage II 07/04/2021  ? Encounter for hepatitis C screening test for low risk patient 07/04/2021  ? M

## 2021-07-24 LAB — CULTURE, BLOOD (ROUTINE X 2): Culture: NO GROWTH

## 2021-07-25 ENCOUNTER — Telehealth: Payer: Self-pay

## 2021-07-25 NOTE — Progress Notes (Signed)
This delivery to include: ?Eliquis '5mg'$  1 at B and 1 EM ?Clopidogrel '75mg'$  1 B ?Vitamin B12 1020mg 1 B ?Trelegy Inhaler 1067m 1 puff once daily  ?Metoprolol Tar '50mg'$  1 B and 1 EM ?Mens Multi 1 at EM ?Fish Oil '1000mg'$  1 B and 1 EM ?Pantoprazole '40mg'$  1 B and 1 EM ?Rosuvastatin '40mg'$  1 EM ? ?Declined the meds: ?Repatha '140mg'$  Inject '140mg'$  every 14 days- Cost  and wants to discuss with CPP on f.u call on 07/27/21 ?Sodium Chloride- [facility]. [facility]ants transferred ?Potassium Chloride 2010m Re[facility] from [facility] 05/27/[facility] 90ds ? ?Delivery date was confirmed on 07/28/21 ? ? ?DanElray McgregorMA ?Clinical Pharmacist Assistant  ?[phone]

## 2021-07-25 NOTE — Telephone Encounter (Signed)
Msg from Milda Smart, RN letting me know patient Dc'd from hospital. Coordinated with team to get CCM referral and TOC set up for patient ?

## 2021-07-26 ENCOUNTER — Telehealth: Payer: Self-pay

## 2021-07-26 ENCOUNTER — Other Ambulatory Visit: Payer: Self-pay | Admitting: Family Medicine

## 2021-07-26 ENCOUNTER — Other Ambulatory Visit: Payer: Self-pay | Admitting: Surgery

## 2021-07-26 DIAGNOSIS — I2782 Chronic pulmonary embolism: Secondary | ICD-10-CM

## 2021-07-26 DIAGNOSIS — R188 Other ascites: Secondary | ICD-10-CM

## 2021-07-26 MED ORDER — APIXABAN 5 MG PO TABS: ORAL_TABLET | ORAL | 0 refills | Status: DC

## 2021-07-27 ENCOUNTER — Ambulatory Visit (INDEPENDENT_AMBULATORY_CARE_PROVIDER_SITE_OTHER): Payer: Medicare HMO

## 2021-07-27 DIAGNOSIS — Z8673 Personal history of transient ischemic attack (TIA), and cerebral infarction without residual deficits: Secondary | ICD-10-CM

## 2021-07-27 DIAGNOSIS — E782 Mixed hyperlipidemia: Secondary | ICD-10-CM

## 2021-07-27 DIAGNOSIS — I119 Hypertensive heart disease without heart failure: Secondary | ICD-10-CM

## 2021-07-27 LAB — AEROBIC/ANAEROBIC CULTURE W GRAM STAIN (SURGICAL/DEEP WOUND): Special Requests: NORMAL

## 2021-07-27 NOTE — Patient Instructions (Signed)
Visit Information ? ? Goals Addressed   ?None ?  ? ?Patient Care Plan: [place]  ?  ? ?Problem Identified: Disease State Management   ?Priority: High  ?Onset Date: 05/19/2021  ?  ? ?Long-Range Goal: Lipids, Cardio, GERD   ?Start Date: 05/19/2021  ?Expected End Date: 05/19/2022  ?Recent Progress: On track  ?Priority: High  ?Note:   ?Current Barriers:  ?Does not contact provider office for questions/concerns ? ?Pharmacist Clinical Goal(s):  ?Patient will verbalize ability to afford treatment regimen through collaboration with PharmD and provider.  ? ?Interventions: ?1:1 collaboration with [name], [name], MD regarding development and update of comprehensive plan of care as evidenced by provider attestation and co-signature ?Inter-disciplinary care team collaboration (see longitudinal plan of care) ?Comprehensive medication review performed; medication list updated in electronic medical record ? ?Hypertension (BP goal <130/80) ?BP Readings from Last 3 Encounters:  ?07/23/21 (!) 136/97  ?07/04/21 140/86  ?06/22/21 (!) 144/103  ?-Controlled ?-Current treatment: ?Metoprolol Tart '50mg'$  BID Appropriate, Effective, Safe, Accessible ?-Medications previously tried: Lisinopril (DC Feb 2023)  ?-Current home readings: Doesn't test ?-Current dietary habits: "Tries to eat healthy" ?-Current exercise habits: loves to hike but is unable after recent hospitalizations ?-Denies hypotensive/hypertensive symptoms ?-Educated on BP goals and benefits of medications for prevention of heart attack, stroke and kidney damage; ?-Counseled to monitor BP at home 3x/week, document, and provide log at future appointments ?-Recommended to continue current medication ? ?Hx PE (2014) (Goal: Prevent recurrence) ?Wt Readings from Last 3 Encounters:  ?07/18/21 174 lb 2.6 oz (79 kg)  ?07/04/21 174 lb (78.9 kg)  ?06/22/21 175 lb (79.4 kg)  ? ?Lab Results  ?Component Value Date  ? CREATININE 0.98 07/23/2021  ?-Controlled ?-Current treatment  ?Eliquis '5mg'$   BID Appropriate, Effective, Safe, Accessible ?-Medications previously tried: N/A  ?-Recommended to continue current medication ? ? ?Hyperlipidemia: (LDL goal < 70) ?The ASCVD Risk score (Arnett DK, et al., 2019) failed to calculate for the following reasons: ?  The valid total cholesterol range is 130 to 320 mg/dL ?Lab Results  ?Component Value Date  ? CHOL 101 07/04/2021  ? CHOL 99 (L) 02/21/2021  ? CHOL 252 (H) 11/25/2020  ? ?Lab Results  ?Component Value Date  ? HDL 26 (L) 07/04/2021  ? HDL 30 (L) 02/21/2021  ? HDL 28 (L) 11/25/2020  ? ?Lab Results  ?Component Value Date  ? [place] 50 07/04/2021  ? [place] 47 02/21/2021  ? LDLCALC 178 (H) 11/25/2020  ? ?Lab Results  ?Component Value Date  ? TRIG 143 07/04/2021  ? TRIG 121 02/21/2021  ? TRIG 240 (H) 11/25/2020  ? ?Lab Results  ?Component Value Date  ? CHOLHDL 3.9 07/04/2021  ? CHOLHDL 3.3 02/21/2021  ? CHOLHDL 9.0 (H) 11/25/2020  ?No results found for: LDLDIRECT ?-Controlled ?-Current treatment: ?Rosuvastatin '40mg'$  Appropriate, Effective, Safe, Accessible ?Repatha '140mg'$  Appropriate, Effective, Safe, Query accessible ?Fish Oil '1000mg'$  BID Appropriate, Effective, Safe, Accessible ?Clopidogrel '75mg'$  Appropriate, Effective, Safe, Accessible ?-Medications previously tried: N/A  ?-Current dietary patterns: "Tries to eat healthy" ?-Current exercise habits: loves to hike but is unable after recent hospitalizations ?-Educated on Cholesterol goals;  ?March 2023: Will try to get patient on Vascepa at future visit, don't want to overwhelm too much today ?May 2023: Unable to afford Repatha, start PAP ASAP ? ?COPD (Goal: control symptoms and prevent exacerbations) ?Pulmonary Functions Testing Results: ? ?No results found for: FEV1, FVC, FEV1FVC, TLC, DLCO ? ?  07/27/2021  ? 10:55 AM  ?CAT Score  ?Total CAT  Score 9  ?CBC ?   ?Component Value Date/Time  ? WBC 9.5 07/23/2021 0154  ? RBC 3.62 (L) 07/23/2021 0154  ? HGB 9.9 (L) 07/23/2021 0154  ? HGB 11.3 (L) 07/04/2021 0943  ? HCT 31.2  (L) 07/23/2021 0154  ? HCT 34.7 (L) 07/04/2021 0943  ? PLT 319 07/23/2021 0154  ? PLT 468 (H) 07/04/2021 0943  ? MCV 86.2 07/23/2021 0154  ? MCV 86 07/04/2021 0943  ? MCH 27.3 07/23/2021 0154  ? MCHC 31.7 07/23/2021 0154  ? RDW 16.1 (H) 07/23/2021 0154  ? RDW 17.0 (H) 07/04/2021 0943  ? LYMPHSABS 3.2 07/18/2021 1807  ? LYMPHSABS 3.0 07/04/2021 0943  ? MONOABS 1.6 (H) 07/18/2021 1807  ? EOSABS 0.1 07/18/2021 1807  ? EOSABS 0.1 07/04/2021 0943  ? BASOSABS 0.1 07/18/2021 1807  ? BASOSABS 0.1 07/04/2021 0943  ?-Controlled ?-Current treatment  ?Albuterol PRN Appropriate, Effective, Safe, Accessible ?Trelegy Appropriate, Effective, Safe, Accessible ?-Medications previously tried: N/A  ?-Gold Grade: Unknown ?-Current COPD Classification:  Unknown ?-MMRC/CAT score: Unknown ?-Pulmonary function testing: Unknown ?-Exacerbations requiring treatment in last 6 months: None ?-Patient denies consistent use of maintenance inhaler ?-Frequency of rescue inhaler use: PRN ?March 2023: Patient's main priority was operations of meds in general ([facility]). Unable to get CAT score or do full assessment. Will do at future visit and go more in-depth then. Spoke long enough to know patient is content on therapy ?May 2023: Conducted CAT score ? ?GERD (Goal: minimize symptoms of reflux ) ?-Controlled ?-Current treatment  ?Pantoprazole '40mg'$  Appropriate, Effective, Safe, Accessible ?-Medications previously tried: none reported  ?-Triggering factors: N/A ?-Hx of Bleeds/ulcers: No ?-Counseled on small meals, elevating head, and sleeping on left side ?-Recommended to continue current medication ? ? ?Patient Goals/Self-Care Activities ?Patient will:  ?- take medications as prescribed as evidenced by patient report and record review ? ?Follow Up Plan: The patient has been provided with contact information for the care management team and has been advised to call with any health related questions or concerns.  ? ?CPP F/U October 2023 ? ?[name],  Pharm.D. - [phone] ? ? ?  ? ? ?Mr. Dotzler was given information about Chronic Care Management services today including:  ?CCM service includes personalized support from designated clinical staff supervised by his physician, including individualized plan of care and coordination with other care providers ?24/7 contact phone numbers for assistance for urgent and routine care needs. ?Standard insurance, coinsurance, copays and deductibles apply for chronic care management only during months in which we provide at least 20 minutes of these services. Most insurances cover these services at 100%, however patients may be responsible for any copay, coinsurance and/or deductible if applicable. This service may help you avoid the need for more expensive face-to-face services. ?Only one practitioner may furnish and bill the service in a calendar month. ?The patient may stop CCM services at any time (effective at the end of the month) by phone call to the office staff. ? ?Patient agreed to services and verbal consent obtained.  ? ?The patient verbalized understanding of instructions, educational materials, and care plan provided today and DECLINED offer to receive copy of patient instructions, educational materials, and care plan.  ?The pharmacy team will reach out to the patient again over the next 60 days.  ? ?Lane Hacker, [place] ? ?

## 2021-07-27 NOTE — Telephone Encounter (Signed)
?  Transition Care Management Follow-up Telephone Call ? ? ?Scott Foley ?[birth year] ? ?Admit Date: 07/18/2021 ?Discharge Date: 07/23/2021 ?Discharged from where: [facility] ? ?Diagnoses: Intra-abdominal fluid collection, PE/DVT  ? ?4 day post discharge 07/27/2021 ? ? ? ?Scott Foley was discharged from [facility]  on 07/23/2021  with the diagnoses listed above.  He was contacted today via telephone in regards to transition of care.   ? ? ?Discharge Instructions: Cipro and Flagyl as instructed for 7 additional days.  Flush drain with 5 ml of saline every 12 hours.   ? ?Items Reviewed: ?Did the pt receive and understand the discharge instructions provided? Yes  ?Medications obtained and verified? Yes  ?Other? Yes  ?Any new allergies since your discharge? No  ?Dietary orders reviewed? Yes ?Do you have support at home? Yes  ? ?Home Care and Equipment/Supplies: ?Were home health services ordered? no ?If so, what is the name of the agency?  ?Has the agency set up a time to come to the patient's home? not applicable ?Were any new equipment or medical supplies ordered?  No ?What is the name of the medical supply agency?  ?not applicable ?Do you have any questions related to the use of the equipment or supplies? No ? ?Functional Questionnaire: (I = Independent and D = Dependent) ?ADLs: I ? ?Bathing/Dressing- I ? ?Meal Prep-  D ? ?Eating-  I ?Maintaining continence- I ? ?Transferring/Ambulation- I ? ?Managing Meds- I ? ?Any patient concerns? No ? ?Follow up appointments reviewed: ?PCP Hospital f/u appt confirmed? Yes  Scheduled to see Dr. Tobie Poet  on May 20  @ 11:00.Marland Kitchen ?[facility] f/u appt confirmed? Yes  Scheduled to see Dr. Zenia Resides  on May 26  @ 11:00. Patient's wife is going to call IR drain clinic to schedule follow-up.    ?Are transportation arrangements needed? No  ?If their condition worsens, is the pt aware to call PCP or go to the Emergency Dept.? Yes ?Was the patient provided with contact  information for the PCP's office/after hours number? Yes ?Was to pt encouraged to call back with questions or concerns? Yes ? ? ? ?07/27/21 11:04 AM  ?

## 2021-07-27 NOTE — Progress Notes (Signed)
Chronic Care Management Pharmacy Note  07/27/2021 Name:  Scott Foley MRN:  [id number] DOB:  Oct 30, 1965  Summary: -Pleasant 56 year old male and his wife Dawm present for initial CCM visit. They were both CNA's. Patient used to love hiking but is unable to do now with his recent hospital problems. He enjoys watching movies, specifically comedies. He has been married for 21 years with 2 Children and 3 dogs. They've been living [place] a campground for 3 years while they wait to renovate their home. It's been put on pause due to recent medical issues.  Recommendations/Changes made from today's visit: -CAT assessment completed -Repatha PAP started  Subjective: Scott Foley is an 56 y.o. year old male who is a primary patient of [name], [name], MD.  The CCM team was consulted for assistance with disease management and care coordination needs.    Engaged with patient by telephone for follow up visit in response to provider referral for pharmacy case management and/or care coordination services.   Consent to Services:  The patient was given the following information about Chronic Care Management services today, agreed to services, and gave verbal consent: 1. CCM service includes personalized support from designated clinical staff supervised by the primary care provider, including individualized plan of care and coordination with other care providers 2. 24/7 contact phone numbers for assistance for urgent and routine care needs. 3. Service will only be billed when office clinical staff spend 20 minutes or more in a month to coordinate care. 4. Only one practitioner may furnish and bill the service in a calendar month. 5.The patient may stop CCM services at any time (effective at the end of the month) by phone call to the office staff. 6. The patient will be responsible for cost sharing (co-pay) of up to 20% of the service fee (after annual deductible is met). Patient agreed to services and  consent obtained.  Patient Care Team: [name], MD as PCP - General (Family Medicine) Sanda Klein, MD as PCP - Cardiology (Cardiology) Lane Hacker, [facility] as Pharmacist (Pharmacist)  Recent office visits:  04/01/21 Jerrell Belfast NP. Seen for Recurrent Acute Pancreatitis. Referral to Pulmonology and [facility] Coordination. Started Albuterol Sulfate 163mg/act. Started Trelegy Ellipta 100-62.5-267mcg/act. Increased Isosorbide Mononitrate from 30 mg to 60 mg daily.    03/22/21 CRochel BromeMD. Seen for Epigastric Pain. No med changes.    02/15/21 HJerrell BelfastNP. Seen for CAD. Started Pantoprazole Sodium 40 mg daily.   12/30/20 CRochel BromeMD. Seen for HLD and HTN. Started on Repatha 1468mevery 14 days. Increased Varenicline Tartrate from 0.67m16mo 1 mg 2 times daily.   11/25/20 CoxRochel Brome. Seen for COPD, HLD and HTN. Started Varenicline Tartrate 0.67mg567mhange otc fish oil to lovaza 1 gm 2 caps twice a day    Recent consult visits:  02/15/21 (Cardiology) RevaJyl Heinz Seen for CAD. No med changes.    Hospital visits:  #3 Medication Reconciliation was completed by comparing discharge summary, patient's EMR and Pharmacy list, and upon discussion with patient.   Admitted to the hospital on 04/13/21 due to Necrotizing Pancreatitis. Discharge date was 05/10/21. [facility] cyan[facility] levofloxacin (Levaquin) linezolid (Zyvox) multivitamin with minerals   Stopped:  ADVIL PM PO isosorbide mononitrate 60 MG 24 hr tablet (IMDUR) lisinopril 40 MG tablet (ZESTRIL)   All other medications remain the same after Hospital Discharge:??    # 2 Medication  Reconciliation was completed by comparing discharge summary, patient's EMR and Pharmacy list, and upon discussion with patient.   Admitted to the hospital on 02/20/21 due to Abdominal Pain. Discharge date was 02/20/21. Discharged from [facility]     Medications remain the same after Hospital Discharge:??    # 1 Medication Reconciliation was completed by comparing discharge summary, patient's EMR and Pharmacy list, and upon discussion with patient.   Admitted to the hospital on 02/09/21 due to Chest Pain. Discharge date was 02/10/21 Discharged from [facility]     Started Isosorbide Mononitrate 90m daily   Stopped Amlodipine 5 mg, Omega 3 1gm and varenicline 1 MG tablet   All other medications remain the same after Hospital Discharge:??    Medication Reconciliation was completed by comparing discharge summary, patient's EMR and Pharmacy list, and upon discussion with patient.   Admitted to the hospital on 10/25/20 due to Colonoscopy Propofol. Discharge date was 10/25/20 [facility]     All medications remain the same after Hospital Discharge:?   Objective:  Lab Results  Component Value Date   CREATININE 0.98 07/23/2021   BUN 19 07/23/2021   EGFR 67 07/04/2021   GFRNONAA >60 07/23/2021   GFRAA 105 12/15/2019   NA 133 (L) 07/23/2021   K 3.5 07/23/2021   CALCIUM 8.7 (L) 07/23/2021   CO2 20 (L) 07/23/2021   GLUCOSE 101 (H) 07/23/2021    No results found for: HGBA1C, FRUCTOSAMINE, GFR, MICROALBUR  Last diabetic Eye exam: No results found for: HMDIABEYEEXA  Last diabetic Foot exam: No results found for: HMDIABFOOTEX   Lab Results  Component Value Date   CHOL 101 07/04/2021   HDL 26 (L) 07/04/2021   LDLCALC 50 07/04/2021   TRIG 143 07/04/2021   CHOLHDL 3.9 07/04/2021       Latest Ref Rng & Units 07/21/2021    6:01 AM 07/20/2021    1:45 AM 07/19/2021    2:35 AM  Hepatic Function  Total Protein 6.5 - 8.1 g/dL 6.8   6.3   6.7    Albumin 3.5 - 5.0 g/dL 2.4   2.5   2.8    AST 15 - 41 U/L '12   10   12    ' ALT 0 - 44 U/L '12   10   10    ' Alk Phosphatase 38 - 126 U/L 65   62   59    Total Bilirubin 0.3 - 1.2 mg/dL 0.4   0.4   1.1       Lab Results  Component Value Date/Time   TSH 1.870 11/25/2020 10:32 AM   TSH 2.390 08/23/2020 09:31 AM       Latest Ref Rng & Units 07/23/2021    1:54 AM 07/22/2021    1:52 AM 07/21/2021    6:01 AM  CBC  WBC 4.0 - 10.5 K/uL 9.5   9.2   9.4    Hemoglobin 13.0 - 17.0 g/dL 9.9   9.5   10.3    Hematocrit 39.0 - 52.0 % 31.2   30.8   31.5    Platelets 150 - 400 K/uL 319   341   319      Lab Results  Component Value Date/Time   VD25OH 48.4 07/04/2021 09:43 AM    Clinical ASCVD: Yes  The ASCVD Risk score (Arnett DK, et al., 2019) failed to calculate for the following reasons:   The valid total cholesterol range  is 130 to 320 mg/dL       06/22/2021    3:16 PM 12/30/2020    1:37 PM 08/23/2020    8:58 AM  Depression screen PHQ 2/9  Decreased Interest 0 0 0  Down, Depressed, Hopeless 0 0 0  PHQ - 2 Score 0 0 0     Other: (CHADS2VASc if Afib, MMRC or CAT for COPD, ACT, DEXA)  Social History   Tobacco Use  Smoking Status Former   Packs/day: 0.75   Years: 30.00   Pack years: 22.50   Types: Cigarettes   Start date: 03/14/1979   Quit date: 11/2020   Years since quitting: 0.7  Smokeless Tobacco Never   BP Readings from Last 3 Encounters:  07/23/21 (!) 136/97  07/04/21 140/86  06/22/21 (!) 144/103   Pulse Readings from Last 3 Encounters:  07/23/21 74  07/04/21 68  06/22/21 90   Wt Readings from Last 3 Encounters:  07/18/21 174 lb 2.6 oz (79 kg)  07/04/21 174 lb (78.9 kg)  06/22/21 175 lb (79.4 kg)   BMI Readings from Last 3 Encounters:  07/18/21 29.90 kg/m  07/04/21 29.87 kg/m  06/22/21 30.04 kg/m    Assessment/Interventions: Review of patient past medical history, allergies, medications, health status, including review of consultants reports, laboratory and other test data, was performed as part of comprehensive evaluation and provision of chronic care management services.   SDOH:  (Social Determinants of Health) assessments and interventions performed:  Yes SDOH Interventions    Flowsheet Row Most Recent Value  SDOH Interventions   Financial Strain Interventions Other (Comment)  [PAP (See CP)]  Transportation Interventions Intervention Not Indicated      SDOH Screenings   Alcohol Screen: Low Risk    Last Alcohol Screening Score (AUDIT): 0  Depression (PHQ2-9): Low Risk    PHQ-2 Score: 0  Financial Resource Strain: Medium Risk   Difficulty of Paying Living Expenses: Somewhat hard  Food Insecurity: Not on file  Housing: Not on file  Physical Activity: Not on file  Social Connections: Not on file  Stress: Not on file  Tobacco Use: Medium Risk   Smoking Tobacco Use: Former   Smokeless Tobacco Use: Never   Passive Exposure: Not on file  Transportation Needs: No Transportation Needs   Lack of Transportation (Medical): No   Lack of Transportation (Non-Medical): No    CCM Care Plan  Allergies  Allergen Reactions   Penicillin G Hives    Medications Reviewed Today     Reviewed by Lane Hacker, [facility] (Pharmacist) on 07/27/21 at 1117  Med List Status: <None>   Medication Order Taking? Sig Documenting Provider Last Dose Status Informant  albuterol (VENTOLIN HFA) 108 (90 Base) MCG/ACT inhaler [id number] No Inhale 2 puffs into the lungs every 6 (six) hours as needed for wheezing or shortness of breath. Rip Harbour, NP 07/17/2021 Active Self  apixaban (ELIQUIS) 5 MG TABS tablet [id number]  TAKE ONE TABLET BY MOUTH EVERY MORNING and TAKE ONE TABLET BY MOUTH EVERY EVENING [name], [name], MD  Active   ciprofloxacin (CIPRO) 500 MG tablet [id number]  Take 1 tablet (500 mg total) by mouth 2 (two) times daily for 7 days. [place] Donnamarie Poag, DO  Active   clopidogrel (PLAVIX) 75 MG tablet [id number] No Take 1 tablet (75 mg total) by mouth daily. [name], MD 07/18/2021 8 am Active Self  doxycycline (VIBRAMYCIN) 100 MG capsule [id number]  Take 1 capsule (100 mg total) by mouth 2 (two) times daily for  7 days. [place] Donnamarie Poag, DO  Active    Evolocumab (REPATHA) 140 MG/ML SOSY [id number] No Inject 140 mg into the skin every 14 (fourteen) days. [name], MD 07/08/2021 Active Self  Fluticasone-Umeclidin-Vilant (TRELEGY ELLIPTA) 100-62.5-25 MCG/ACT AEPB [id number] No Inhale 1 puff into the lungs daily. [name], MD 07/17/2021 Active Self  metoprolol tartrate (LOPRESSOR) 50 MG tablet [id number] No Take 1 tablet (50 mg total) by mouth 2 (two) times daily. [name], MD 07/18/2021 8 am Active Self  metroNIDAZOLE (FLAGYL) 500 MG tablet [id number]  Take 1 tablet (500 mg total) by mouth 2 (two) times daily for 7 days. [place] Donnamarie Poag, DO  Active   Multiple Vitamin (MULTIVITAMIN WITH MINERALS) TABS tablet [id number] No Take 1 tablet by mouth daily. Hosie Poisson, MD 07/18/2021 Active Self  Omega-3 Fatty Acids (FISH OIL) 1000 MG CAPS [id number] No Take 1 capsule (1,000 mg total) by mouth 2 (two) times daily. [name], MD 07/18/2021 Active Self  pantoprazole (PROTONIX) 40 MG tablet [id number] No TAKE ONE TABLET BY MOUTH EVERY MORNING and TAKE ONE TABLET BY MOUTH EVERY EVENING  Patient taking differently: 40 mg 2 (two) times daily.   [name], MD 07/18/2021 Active Self  potassium chloride SA (KLOR-CON M) 20 MEQ tablet [id number] No Take 1 tablet (20 mEq total) by mouth daily. [name], MD 07/18/2021 Active Self  rosuvastatin (CRESTOR) 40 MG tablet [id number] No Take 1 tablet (40 mg total) by mouth at bedtime. [name], MD 07/17/2021 Active Self  sodium chloride 0.9 % injection [id number]  Inject 5 mLs into the vein every 12 (twelve) hours. Flush abdominal drain with 5 mL normal saline every 12 hours [place] Eric J, DO  Active   vitamin B-12 (CYANOCOBALAMIN) 1000 MCG tablet [id number] No TAKE ONE TABLET BY MOUTH EVERY MORNING  Patient taking differently: Take 1,000 mcg by mouth daily.   [name], MD 07/18/2021 Active Self            Patient Active Problem List   Diagnosis Date Noted   Intra-abdominal fluid collection 07/18/2021   B12  deficiency 07/04/2021   CKD (chronic kidney disease), stage II 07/04/2021   Encounter for hepatitis C screening test for low risk patient 07/04/2021   Medication monitoring encounter 06/24/2021   History of blood clotting disorder 06/11/2021   Hypomagnesemia 06/11/2021   Pericardial tamponade    Pericardial effusion 04/29/2021   Normocytic anemia 04/15/2021   Hypoglycemia 04/15/2021   Protein-calorie malnutrition, severe 04/15/2021   Sepsis ([place]) 04/13/2021   AKI (acute kidney injury) ([place]) 04/13/2021   Hypokalemia 04/13/2021   Necrotizing pancreatitis 04/13/2021   Chronic respiratory failure with hypoxia ([place]) 04/13/2021   Essential hypertension 02/15/2021   History of pulmonary embolism 02/15/2021   Coronary artery disease due to lipid rich plaque 01/01/2021   Hypertensive heart disease without congestive heart failure 12/06/2020   Acquired thrombophilia ([place]) 12/06/2020   Morbid obesity ([place]) 12/06/2020   Colon polyp 10/25/2020   [place], post-stroke 07/28/2019   DNR (do not resuscitate) 03/03/2019   History of CVA (cerebrovascular accident) 07/16/2018   Chronic obstructive pulmonary disease ([place]) 07/16/2018   Closed rib fracture right 9th and 10th 07/16/2018   Pulmonary nodules/lesions, multiple 11/25/2012   Coronary artery disease with stable angina pectoris ([place])    Hyperlipemia     Immunization History  Administered Date(s) Administered   Influenza Inj Mdck Quad Pf 12/15/2019, 12/30/2020   Influenza Split 01/03/2012   Influenza,inj,Quad PF,6+ Mos 11/25/2012   PNEUMOCOCCAL CONJUGATE-20 12/30/2020  Pneumococcal Polysaccharide-23 11/25/2012    Conditions to be addressed/monitored:  Hypertension, Hyperlipidemia, GERD, and COPD  Care Plan : [place]  Updates made by Lane Hacker, [place] since 07/27/2021 12:00 AM     Problem: Disease State Management   Priority: High  Onset Date: 05/19/2021     Long-Range Goal: Lipids, Cardio, GERD   Start Date:  05/19/2021  Expected End Date: 05/19/2022  Recent Progress: On track  Priority: High  Note:   Current Barriers:  Does not contact provider office for questions/concerns  Pharmacist Clinical Goal(s):  Patient will verbalize ability to afford treatment regimen through collaboration with PharmD and provider.   Interventions: 1:1 collaboration with [name], Elnita Maxwell, MD regarding development and update of comprehensive plan of care as evidenced by provider attestation and co-signature Inter-disciplinary care team collaboration (see longitudinal plan of care) Comprehensive medication review performed; medication list updated in electronic medical record  Hypertension (BP goal <130/80) BP Readings from Last 3 Encounters:  07/23/21 (!) 136/97  07/04/21 140/86  06/22/21 (!) 144/103  -Controlled -Current treatment: Metoprolol Tart 23m BID Appropriate, Effective, Safe, Accessible -Medications previously tried: Lisinopril (DC Feb 2023)  -Current home readings: Doesn't test -Current dietary habits: "Tries to eat healthy" -Current exercise habits: loves to hike but is unable after recent hospitalizations -Denies hypotensive/hypertensive symptoms -Educated on BP goals and benefits of medications for prevention of heart attack, stroke and kidney damage; -Counseled to monitor BP at home 3x/week, document, and provide log at future appointments -Recommended to continue current medication  Hx PE (2014) (Goal: Prevent recurrence) Wt Readings from Last 3 Encounters:  07/18/21 174 lb 2.6 oz (79 kg)  07/04/21 174 lb (78.9 kg)  06/22/21 175 lb (79.4 kg)   Lab Results  Component Value Date   CREATININE 0.98 07/23/2021  -Controlled -Current treatment  Eliquis 550mBID Appropriate, Effective, Safe, Accessible -Medications previously tried: N/A  -Recommended to continue current medication   Hyperlipidemia: (LDL goal < 70) The ASCVD Risk score (Arnett DK, et al., 2019) failed to calculate for the  following reasons:   The valid total cholesterol range is 130 to 320 mg/dL Lab Results  Component Value Date   CHOL 101 07/04/2021   CHOL 99 (L) 02/21/2021   CHOL 252 (H) 11/25/2020   Lab Results  Component Value Date   HDL 26 (L) 07/04/2021   HDL 30 (L) 02/21/2021   HDL 28 (L) 11/25/2020   Lab Results  Component Value Date   LDLCALC 50 07/04/2021   LDLCALC 47 02/21/2021   LDLCALC 178 (H) 11/25/2020   Lab Results  Component Value Date   TRIG 143 07/04/2021   TRIG 121 02/21/2021   TRIG 240 (H) 11/25/2020   Lab Results  Component Value Date   CHOLHDL 3.9 07/04/2021   CHOLHDL 3.3 02/21/2021   CHOLHDL 9.0 (H) 11/25/2020  No results found for: LDLDIRECT -Controlled -Current treatment: Rosuvastatin 4081mppropriate, Effective, Safe, Accessible Repatha 140m63mpropriate, Effective, Safe, Query accessible Fish Oil 1000mg28m Appropriate, Effective, Safe, Accessible Clopidogrel 75mg 43mopriate, Effective, Safe, Accessible -Medications previously tried: N/A  -Current dietary patterns: "Tries to eat healthy" -Current exercise habits: loves to hike but is unable after recent hospitalizations -Educated on Cholesterol goals;  March 2023: Will try to get patient on Vascepa at future visit, don't want to overwhelm too much today May 2023: Unable to afford Repatha, start PAP ASAP  COPD (Goal: control symptoms and prevent exacerbations) Pulmonary Functions Testing Results:  No results found for: FEV1, FVC,  FEV1FVC, TLC, DLCO    07/27/2021   10:55 AM  CAT Score  Total CAT Score 9  CBC    Component Value Date/Time   WBC 9.5 07/23/2021 0154   RBC 3.62 (L) 07/23/2021 0154   HGB 9.9 (L) 07/23/2021 0154   HGB 11.3 (L) 07/04/2021 0943   HCT 31.2 (L) 07/23/2021 0154   HCT 34.7 (L) 07/04/2021 0943   PLT 319 07/23/2021 0154   PLT 468 (H) 07/04/2021 0943   MCV 86.2 07/23/2021 0154   MCV 86 07/04/2021 0943   MCH 27.3 07/23/2021 0154   MCHC 31.7 07/23/2021 0154   RDW 16.1 (H)  07/23/2021 0154   RDW 17.0 (H) 07/04/2021 0943   LYMPHSABS 3.2 07/18/2021 1807   LYMPHSABS 3.0 07/04/2021 0943   MONOABS 1.6 (H) 07/18/2021 1807   EOSABS 0.1 07/18/2021 1807   EOSABS 0.1 07/04/2021 0943   BASOSABS 0.1 07/18/2021 1807   BASOSABS 0.1 07/04/2021 0943  -Controlled -Current treatment  Albuterol PRN Appropriate, Effective, Safe, Accessible Trelegy Appropriate, Effective, Safe, Accessible -Medications previously tried: N/A  -Gold Grade: Unknown -Current COPD Classification:  Unknown -MMRC/CAT score: Unknown -Pulmonary function testing: Unknown -Exacerbations requiring treatment in last 6 months: None -Patient denies consistent use of maintenance inhaler -Frequency of rescue inhaler use: PRN March 2023: Patient's main priority was operations of meds in general ([facility]). Unable to get CAT score or do full assessment. Will do at future visit and go more in-depth then. Spoke long enough to know patient is content on therapy May 2023: Conducted CAT score  GERD (Goal: minimize symptoms of reflux ) -Controlled -Current treatment  Pantoprazole 67m Appropriate, Effective, Safe, Accessible -Medications previously tried: none reported  -Triggering factors: N/A -Hx of Bleeds/ulcers: No -Counseled on small meals, elevating head, and sleeping on left side -Recommended to continue current medication   Patient Goals/Self-Care Activities Patient will:  - take medications as prescribed as evidenced by patient report and record review  Follow Up Plan: The patient has been provided with contact information for the care management team and has been advised to call with any health related questions or concerns.   CPP F/U October 2023  NArizona Constable PSherian ReinD. - [phone]        Medication Assistance: None required.  Patient affirms current coverage meets needs.  Compliance/Adherence/Medication fill history: Star Rating Drugs:  Medication:                Last Fill:          Day Supply Lisinopril                      04/01/21            90ds                                     12/02/20            90ds Rosuvastatin               04/01/21            90ds                                     12/02/20            90ds   Care Gaps: Last annual wellness visit?08/19/19  Colonoscopy: 10/25/20 Last eye exam / retinopathy screening?N/A Last diabetic foot exam?N/A    Patient's preferred pharmacy is:  [facility] - [place], [place] - [address] [place] [address] Phone: [phone] Fax: [phone]  [facility] [address] [place] [address] [place] [address] Phone: [phone] Fax: [phone]  Uses pill box? No -   Pt endorses 80% compliance  We discussed: Benefits of medication synchronization, packaging and delivery as well as enhanced pharmacist oversight with [facility]. Patient decided to: Utilize [facility] for medication synchronization, packaging and delivery -Verbal consent obtained for [facility] enhanced pharmacy services (medication synchronization, adherence packaging, delivery coordination). A medication sync plan was created to allow patient to get all medications delivered once every 30 to 90 days per patient preference. Patient understands they have freedom to choose pharmacy and clinical pharmacist will coordinate care between all prescribers and [facility].  Care Plan and Follow Up Patient Decision:  Patient agrees to Care Plan and Follow-up.  Plan: The patient has been provided with contact information for the care management team and has been advised to call with any health related questions or concerns.   CPP F/U October2023  [name], Pharm.D. - [phone]

## 2021-07-28 ENCOUNTER — Telehealth: Payer: Self-pay

## 2021-07-28 NOTE — Chronic Care Management (AMB) (Signed)
    Chronic Care Management Pharmacy Assistant   Name: Scott Foley  MRN: [id number] DOB: 06-05-1965  Reason for Encounter: Patient assistance   [id number]: PAP application completed and uploaded  for repatha to mail.   Medications: Outpatient Encounter Medications as of 07/28/2021  Medication Sig   albuterol (VENTOLIN HFA) 108 (90 Base) MCG/ACT inhaler Inhale 2 puffs into the lungs every 6 (six) hours as needed for wheezing or shortness of breath.   apixaban (ELIQUIS) 5 MG TABS tablet TAKE ONE TABLET BY MOUTH EVERY MORNING and TAKE ONE TABLET BY MOUTH EVERY EVENING   ciprofloxacin (CIPRO) 500 MG tablet Take 1 tablet (500 mg total) by mouth 2 (two) times daily for 7 days.   clopidogrel (PLAVIX) 75 MG tablet Take 1 tablet (75 mg total) by mouth daily.   doxycycline (VIBRAMYCIN) 100 MG capsule Take 1 capsule (100 mg total) by mouth 2 (two) times daily for 7 days.   Evolocumab (REPATHA) 140 MG/ML SOSY Inject 140 mg into the skin every 14 (fourteen) days.   Fluticasone-Umeclidin-Vilant (TRELEGY ELLIPTA) 100-62.5-25 MCG/ACT AEPB Inhale 1 puff into the lungs daily.   metoprolol tartrate (LOPRESSOR) 50 MG tablet Take 1 tablet (50 mg total) by mouth 2 (two) times daily.   metroNIDAZOLE (FLAGYL) 500 MG tablet Take 1 tablet (500 mg total) by mouth 2 (two) times daily for 7 days.   Multiple Vitamin (MULTIVITAMIN WITH MINERALS) TABS tablet Take 1 tablet by mouth daily.   Omega-3 Fatty Acids (FISH OIL) 1000 MG CAPS Take 1 capsule (1,000 mg total) by mouth 2 (two) times daily.   pantoprazole (PROTONIX) 40 MG tablet TAKE ONE TABLET BY MOUTH EVERY MORNING and TAKE ONE TABLET BY MOUTH EVERY EVENING (Patient taking differently: 40 mg 2 (two) times daily.)   potassium chloride SA (KLOR-CON M) 20 MEQ tablet Take 1 tablet (20 mEq total) by mouth daily.   rosuvastatin (CRESTOR) 40 MG tablet Take 1 tablet (40 mg total) by mouth at bedtime.   sodium chloride 0.9 % injection Inject 5 mLs into the vein every  12 (twelve) hours. Flush abdominal drain with 5 mL normal saline every 12 hours   vitamin B-12 (CYANOCOBALAMIN) 1000 MCG tablet TAKE ONE TABLET BY MOUTH EVERY MORNING (Patient taking differently: Take 1,000 mcg by mouth daily.)   No facility-administered encounter medications on file as of 07/28/2021.     [place] Pharmacist Assistant [phone]

## 2021-07-29 NOTE — Chronic Care Management (AMB) (Signed)
07/12/5866- Mailed application to patient from PCP office.   Pattricia Boss, [place] Pharmacist Assistant [phone]

## 2021-08-01 ENCOUNTER — Other Ambulatory Visit: Payer: Self-pay

## 2021-08-01 ENCOUNTER — Ambulatory Visit (INDEPENDENT_AMBULATORY_CARE_PROVIDER_SITE_OTHER): Payer: Medicare HMO | Admitting: Family Medicine

## 2021-08-01 VITALS — BP 148/90 | HR 72 | Temp 97.4°F | Resp 16 | Ht 64.0 in | Wt 176.4 lb

## 2021-08-01 DIAGNOSIS — I10 Essential (primary) hypertension: Secondary | ICD-10-CM | POA: Diagnosis not present

## 2021-08-01 DIAGNOSIS — R188 Other ascites: Secondary | ICD-10-CM

## 2021-08-01 MED ORDER — ONDANSETRON HCL 4 MG PO TABS: 4.0000 mg | ORAL_TABLET | Freq: Three times a day (TID) | ORAL | 0 refills | Status: AC | PRN

## 2021-08-01 MED ORDER — LISINOPRIL 20 MG PO TABS: 20.0000 mg | ORAL_TABLET | Freq: Every day | ORAL | 0 refills | Status: DC

## 2021-08-01 NOTE — Progress Notes (Deleted)
Subjective:  Patient ID: Scott Foley, male    DOB: [birth year]  Age: 56 y.o. MRN: [id number]  Chief Complaint  Patient presents with   Hospitalization Follow-up    HPI   Current Outpatient Medications on File Prior to Visit  Medication Sig Dispense Refill   albuterol (VENTOLIN HFA) 108 (90 Base) MCG/ACT inhaler Inhale 2 puffs into the lungs every 6 (six) hours as needed for wheezing or shortness of breath. 8 g 0   apixaban (ELIQUIS) 5 MG TABS tablet TAKE ONE TABLET BY MOUTH EVERY MORNING and TAKE ONE TABLET BY MOUTH EVERY EVENING 180 tablet 0   clopidogrel (PLAVIX) 75 MG tablet Take 1 tablet (75 mg total) by mouth daily. 90 tablet 0   Evolocumab (REPATHA) 140 MG/ML SOSY Inject 140 mg into the skin every 14 (fourteen) days. 6 mL 0   Fluticasone-Umeclidin-Vilant (TRELEGY ELLIPTA) 100-62.5-25 MCG/ACT AEPB Inhale 1 puff into the lungs daily. 1 each 11   metoprolol tartrate (LOPRESSOR) 50 MG tablet Take 1 tablet (50 mg total) by mouth 2 (two) times daily. 180 tablet 0   Multiple Vitamin (MULTIVITAMIN WITH MINERALS) TABS tablet Take 1 tablet by mouth daily.     Omega-3 Fatty Acids (FISH OIL) 1000 MG CAPS Take 1 capsule (1,000 mg total) by mouth 2 (two) times daily. 180 capsule 0   pantoprazole (PROTONIX) 40 MG tablet TAKE ONE TABLET BY MOUTH EVERY MORNING and TAKE ONE TABLET BY MOUTH EVERY EVENING (Patient taking differently: 40 mg 2 (two) times daily.) 60 tablet 1   potassium chloride SA (KLOR-CON M) 20 MEQ tablet Take 1 tablet (20 mEq total) by mouth daily. 30 tablet 3   rosuvastatin (CRESTOR) 40 MG tablet Take 1 tablet (40 mg total) by mouth at bedtime. 90 tablet 0   sodium chloride 0.9 % injection Inject 5 mLs into the vein every 12 (twelve) hours. Flush abdominal drain with 5 mL normal saline every 12 hours 300 mL 2   vitamin B-12 (CYANOCOBALAMIN) 1000 MCG tablet TAKE ONE TABLET BY MOUTH EVERY MORNING (Patient taking differently: Take 1,000 mcg by mouth daily.) 30 tablet 1   No  current facility-administered medications on file prior to visit.   Past Medical History:  Diagnosis Date   Acquired thrombophilia ([place]) 12/06/2020   Acute pancreatitis after endoscopic retrograde cholangiopancreatography (ERCP) 03/03/2021   ERCP by Dr Odie Sera in [facility] 03/03/2021   Chest pain 02/09/2021   Chronic obstructive pulmonary disease ([place]) 07/16/2018   Colon polyp 10/25/2020   serrated adenoma.   Coronary artery disease due to lipid rich plaque 01/01/2021   Coronary artery disease with stable angina pectoris [facility])    AMI No heart cath, treated medically [facility]   Diverticulosis of colon 10/25/2020   Noted on colnoscopy by Dr Vicente Males in [place].   DNR (do not resuscitate) 03/03/2019   Hyperlipemia    Hypertension    Hypertensive heart disease without congestive heart failure 12/06/2020   Morbid obesity ([place]) 12/06/2020   BMI 35 with serious comorbidities including CAD, HTN, STROKE, Hyperlipidemia.   Need for vaccination 12/15/2019   Pulmonary embolus ([place]) 03/03/2019   Pulmonary nodules/lesions, multiple 11/25/2012   9/15/2014CT Chest : small nodule RUL LUL. Stable since 03/2012 Smoking cessation advised and Rx chantix Repeat CT Chest 4 /21/15: subpleural lymph nodes, likely benign repeat one year 2016>>>no change, considered benign Arlyce Harman 11/25/2012  NORMAL   Sequela, post-stroke 07/28/2019   Tobacco use disorder 11/25/2012   Past Surgical History:  Procedure Laterality Date  COLONOSCOPY WITH PROPOFOL N/A 10/25/2020   Jonathon Bellows, MD, at [facility]. 25 MM serrated adenoma polyp at ascending colon removed and site clipped.  Pan diverticulosis.   IR RADIOLOGIST EVAL & MGMT  05/25/2021   LAPAROSCOPIC CHOLECYSTECTOMY  02/2021   PERICARDIOCENTESIS N/A 05/01/2021   Procedure: PERICARDIOCENTESIS;  Surgeon: Martinique, Peter M, MD;  Location: [place] [facility];  Service: Cardiovascular;  Laterality: N/A;   TONSILLECTOMY      Family History  Problem Relation Age of Onset    Hypertension Father    Heart attack Father    Stroke Father    Prostate cancer Father    Hypertension Sister    Hypertension Brother    Heart attack Brother    Lung cancer Sister    Social History   Socioeconomic History   Marital status: Married    Spouse name: Not on file   Number of children: Not on file   Years of education: Not on file   Highest education level: Not on file  Occupational History   Not on file  Tobacco Use   Smoking status: Former    Packs/day: 0.75    Years: 30.00    Pack years: 22.50    Types: Cigarettes    Start date: 03/14/1979    Quit date: 11/2020    Years since quitting: 0.7   Smokeless tobacco: Never  Vaping Use   Vaping Use: Former   Start date: 07/28/2018  Substance and Sexual Activity   Alcohol use: No   Drug use: No   Sexual activity: Yes    Partners: Female  Other Topics Concern   Not on file  Social History Narrative   Not on file   Social Determinants of Health   Financial Resource Strain: Medium Risk   Difficulty of Paying Living Expenses: Somewhat hard  Food Insecurity: Not on file  Transportation Needs: No Transportation Needs   Lack of Transportation (Medical): No   Lack of Transportation (Non-Medical): No  Physical Activity: Not on file  Stress: Not on file  Social Connections: Not on file    Review of Systems  Constitutional:  Negative for chills and fever.  HENT:  Negative for congestion, rhinorrhea and sore throat.   Respiratory:  Negative for cough and shortness of breath.   Cardiovascular:  Negative for chest pain and palpitations.  Gastrointestinal:  Positive for abdominal pain (symptoms started on Friday and lasted for 4-5 hours but have resolved.), nausea and vomiting. Negative for constipation and diarrhea.  Genitourinary:  Negative for dysuria and urgency.  Musculoskeletal:  Negative for arthralgias, back pain and myalgias.  Neurological:  Negative for dizziness and headaches.  Psychiatric/Behavioral:   Negative for dysphoric mood. The patient is not nervous/anxious.     Objective:  BP (!) 148/90   Pulse 72   Temp (!) 97.4 F (36.3 C)   Resp 16   Ht '5\' 4"'$  (1.626 m)   Wt 176 lb 6.4 oz (80 kg)   BMI 30.28 kg/m      08/01/2021   10:46 AM 07/23/2021    9:45 AM 07/23/2021    5:21 AM  BP/Weight  Systolic BP 169 678 938  Diastolic BP 90 97 86  Wt. (Lbs) 176.4    BMI 30.28 kg/m2      Physical Exam  Diabetic Foot Exam - Simple   No data filed      Lab Results  Component Value Date   WBC 9.2 08/01/2021   HGB 11.7 (L) 08/01/2021  HCT 37.6 08/01/2021   PLT 403 08/01/2021   GLUCOSE 73 08/01/2021   CHOL 101 07/04/2021   TRIG 143 07/04/2021   HDL 26 (L) 07/04/2021   LDLCALC 50 07/04/2021   ALT 13 08/01/2021   AST 13 08/01/2021   NA 142 08/01/2021   K 3.8 08/01/2021   CL 104 08/01/2021   CREATININE 1.11 08/01/2021   BUN 11 08/01/2021   CO2 24 08/01/2021   TSH 1.870 11/25/2020   INR 1.2 07/21/2021      Assessment & Plan:   Problem List Items Addressed This Visit       Cardiovascular and Mediastinum   Essential hypertension    Restart lisinopril 40 mg one-half tablet once daily.          Other   Intra-abdominal fluid collection - Primary    Check labs.       Relevant Orders   CBC with Differential (Completed)   Comprehensive metabolic panel (Completed)  .  Meds ordered this encounter  Medications   ondansetron (ZOFRAN) 4 MG tablet    Sig: Take 1 tablet (4 mg total) by mouth every 8 (eight) hours as needed for nausea or vomiting.    Dispense:  20 tablet    Refill:  0    Orders Placed This Encounter  Procedures   CBC with Differential   Comprehensive metabolic panel     Follow-up: Return in about 4 weeks (around 08/29/2021) for chronic follow up .  An After Visit Summary was printed and given to the patient.  [name], MD [name] [phone]

## 2021-08-01 NOTE — Patient Instructions (Addendum)
Take lisinopril 40 mg one-half tablet daily.

## 2021-08-01 NOTE — Progress Notes (Unsigned)
Subjective:  Patient ID: Scott Foley, male    DOB: 21-Mar-1965  Age: 56 y.o. MRN: [id number]  Chief Complaint  Patient presents with   Hospitalization Follow-up    HPI Donald Memoli was admitted to [facility] 5/08 with 5 day stay due to intra-abdominal fluid collection and PE/DVT. Discharged on 5/13 with doxycycline/cipro/flagyl for 7 day course. He has completed course of antibiotics. Discharged with JP drain, flushing every 12 hours with 5 mL of normal saline. Had episode of abdominal pain 5/10 and vomiting on Friday that lasted a few hours, this has resolved. He had eaten a sausage biscuit prior to episode. Complains of intermittent abd pain and nausea. Blood pressure has been elevated for around one month. Lisinopril was previously stopped due to hypotension.   Per discharge summary, Patient presenting to ED with progressive and recurrent abdominal pain localized to the right lower quadrant with fever up to 101.0 F.  Patient with history of laparoscopic cholecystectomy December 2022 and developed post ERCP pancreatitis that developed into infected necrosis requiring IR drain placement February 2023.  Drain was subsequently removed and now CT abdomen/pelvis demonstrating recurrence of right lower quadrant collection measuring up to 6.5 cm.  Patient was started on empiric antibiotics with cefepime and metronidazole.  Patient underwent drain placement by IR on 07/21/2021.  Patient's WBC count improved from 18.3-9.5 at time of discharge.  Drain culture showing few Streptococcus anginosus.  Will discharge on doxycycline/Cipro/Flagyl for 7 additional days now that we have source control.  Outpatient follow-up scheduled with Dr. Zenia Resides on 08/05/2021.  Will need close follow-up with IR drain clinic on discharge as well.  Continue to flush drain with 5 mL saline every 12 hours  Current Outpatient Medications on File Prior to Visit  Medication Sig Dispense Refill   albuterol (VENTOLIN HFA)  108 (90 Base) MCG/ACT inhaler Inhale 2 puffs into the lungs every 6 (six) hours as needed for wheezing or shortness of breath. 8 g 0   apixaban (ELIQUIS) 5 MG TABS tablet TAKE ONE TABLET BY MOUTH EVERY MORNING and TAKE ONE TABLET BY MOUTH EVERY EVENING 180 tablet 0   clopidogrel (PLAVIX) 75 MG tablet Take 1 tablet (75 mg total) by mouth daily. 90 tablet 0   Evolocumab (REPATHA) 140 MG/ML SOSY Inject 140 mg into the skin every 14 (fourteen) days. 6 mL 0   Fluticasone-Umeclidin-Vilant (TRELEGY ELLIPTA) 100-62.5-25 MCG/ACT AEPB Inhale 1 puff into the lungs daily. 1 each 11   metoprolol tartrate (LOPRESSOR) 50 MG tablet Take 1 tablet (50 mg total) by mouth 2 (two) times daily. 180 tablet 0   Multiple Vitamin (MULTIVITAMIN WITH MINERALS) TABS tablet Take 1 tablet by mouth daily.     Omega-3 Fatty Acids (FISH OIL) 1000 MG CAPS Take 1 capsule (1,000 mg total) by mouth 2 (two) times daily. 180 capsule 0   pantoprazole (PROTONIX) 40 MG tablet TAKE ONE TABLET BY MOUTH EVERY MORNING and TAKE ONE TABLET BY MOUTH EVERY EVENING (Patient taking differently: 40 mg 2 (two) times daily.) 60 tablet 1   potassium chloride SA (KLOR-CON M) 20 MEQ tablet Take 1 tablet (20 mEq total) by mouth daily. 30 tablet 3   rosuvastatin (CRESTOR) 40 MG tablet Take 1 tablet (40 mg total) by mouth at bedtime. 90 tablet 0   sodium chloride 0.9 % injection Inject 5 mLs into the vein every 12 (twelve) hours. Flush abdominal drain with 5 mL normal saline every 12 hours 300 mL 2   vitamin B-12 (CYANOCOBALAMIN)  1000 MCG tablet TAKE ONE TABLET BY MOUTH EVERY MORNING (Patient taking differently: Take 1,000 mcg by mouth daily.) 30 tablet 1   No current facility-administered medications on file prior to visit.   Past Medical History:  Diagnosis Date   Acquired thrombophilia ([place]) 12/06/2020   Acute pancreatitis after endoscopic retrograde cholangiopancreatography (ERCP) 03/03/2021   ERCP by Dr Odie Sera in [facility] 03/03/2021   Chest pain  02/09/2021   Chronic obstructive pulmonary disease ([place]) 07/16/2018   Colon polyp 10/25/2020   serrated adenoma.   Coronary artery disease due to lipid rich plaque 01/01/2021   Coronary artery disease with stable angina pectoris [facility])    AMI No heart cath, treated medically [facility]   Diverticulosis of colon 10/25/2020   Noted on colnoscopy by Dr Vicente Males in [place].   DNR (do not resuscitate) 03/03/2019   Hyperlipemia    Hypertension    Hypertensive heart disease without congestive heart failure 12/06/2020   Morbid obesity ([place]) 12/06/2020   BMI 35 with serious comorbidities including CAD, HTN, STROKE, Hyperlipidemia.   Need for vaccination 12/15/2019   Pulmonary embolus ([place]) 03/03/2019   Pulmonary nodules/lesions, multiple 11/25/2012   9/15/2014CT Chest : small nodule RUL LUL. Stable since 03/2012 Smoking cessation advised and Rx chantix Repeat CT Chest 4 /21/15: subpleural lymph nodes, likely benign repeat one year 2016>>>no change, considered benign Arlyce Harman 11/25/2012  NORMAL   Sequela, post-stroke 07/28/2019   Tobacco use disorder 11/25/2012   Past Surgical History:  Procedure Laterality Date   COLONOSCOPY WITH PROPOFOL N/A 10/25/2020   Jonathon Bellows, MD, at [facility]. 25 MM serrated adenoma polyp at ascending colon removed and site clipped.  Pan diverticulosis.   IR RADIOLOGIST EVAL & MGMT  05/25/2021   LAPAROSCOPIC CHOLECYSTECTOMY  02/2021   PERICARDIOCENTESIS N/A 05/01/2021   Procedure: PERICARDIOCENTESIS;  Surgeon: Martinique, Peter M, MD;  Location: [place] [facility];  Service: Cardiovascular;  Laterality: N/A;   TONSILLECTOMY      Family History  Problem Relation Age of Onset   Hypertension Father    Heart attack Father    Stroke Father    Prostate cancer Father    Hypertension Sister    Hypertension Brother    Heart attack Brother    Lung cancer Sister    Social History   Socioeconomic History   Marital status: Married    Spouse name: Not on file   Number of children: Not  on file   Years of education: Not on file   Highest education level: Not on file  Occupational History   Not on file  Tobacco Use   Smoking status: Former    Packs/day: 0.75    Years: 30.00    Pack years: 22.50    Types: Cigarettes    Start date: 03/14/1979    Quit date: 11/2020    Years since quitting: 0.7   Smokeless tobacco: Never  Vaping Use   Vaping Use: Former   Start date: 07/28/2018  Substance and Sexual Activity   Alcohol use: No   Drug use: No   Sexual activity: Yes    Partners: Female  Other Topics Concern   Not on file  Social History Narrative   Not on file   Social Determinants of Health   Financial Resource Strain: Medium Risk   Difficulty of Paying Living Expenses: Somewhat hard  Food Insecurity: Not on file  Transportation Needs: No Transportation Needs   Lack of Transportation (Medical): No   Lack of Transportation (Non-Medical): No  Physical Activity: Not on file  Stress: Not on file  Social Connections: Not on file    Review of Systems  Constitutional:  Negative for appetite change, fatigue and fever.  HENT:  Negative for congestion, ear pain, sinus pressure and sore throat.   Eyes:  Negative for pain.  Respiratory:  Negative for cough, shortness of breath and wheezing.   Cardiovascular:  Negative for chest pain and palpitations.  Gastrointestinal:  Positive for abdominal pain (intermittent) and nausea. Negative for constipation, diarrhea and vomiting.  Genitourinary:  Negative for dysuria and frequency.  Musculoskeletal:  Negative for arthralgias, back pain, joint swelling and myalgias.  Skin:  Negative for rash.  Neurological:  Negative for dizziness, weakness and headaches.  Psychiatric/Behavioral:  Negative for dysphoric mood. The patient is not nervous/anxious.     Objective:  BP (!) 148/90   Pulse 72   Temp (!) 97.4 F (36.3 C)   Resp 16   Ht '5\' 4"'$  (1.626 m)   Wt 176 lb 6.4 oz (80 kg)   BMI 30.28 kg/m      08/01/2021   10:46 AM  07/23/2021    9:45 AM 07/23/2021    5:21 AM  BP/Weight  Systolic BP 829 562 130  Diastolic BP 90 97 86  Wt. (Lbs) 176.4    BMI 30.28 kg/m2      Physical Exam Vitals reviewed.  Constitutional:      Appearance: Normal appearance. He is normal weight.  HENT:     Right Ear: Tympanic membrane normal.     Left Ear: Tympanic membrane normal.     Nose: Nose normal.  Cardiovascular:     Rate and Rhythm: Normal rate and regular rhythm.     Pulses: Normal pulses.     Heart sounds: Normal heart sounds.  Pulmonary:     Effort: Pulmonary effort is normal.     Breath sounds: Normal breath sounds.  Abdominal:     General: Bowel sounds are normal.     Comments: JP drain located in RLQ  Neurological:     Mental Status: He is oriented to person, place, and time. Mental status is at baseline.  Psychiatric:        Mood and Affect: Mood normal.        Behavior: Behavior normal.    Diabetic Foot Exam - Simple   No data filed      Lab Results  Component Value Date   WBC 9.2 08/01/2021   HGB 11.7 (L) 08/01/2021   HCT 37.6 08/01/2021   PLT 403 08/01/2021   GLUCOSE 73 08/01/2021   CHOL 101 07/04/2021   TRIG 143 07/04/2021   HDL 26 (L) 07/04/2021   LDLCALC 50 07/04/2021   ALT 13 08/01/2021   AST 13 08/01/2021   NA 142 08/01/2021   K 3.8 08/01/2021   CL 104 08/01/2021   CREATININE 1.11 08/01/2021   BUN 11 08/01/2021   CO2 24 08/01/2021   TSH 1.870 11/25/2020   INR 1.2 07/21/2021      Assessment & Plan:   Problem List Items Addressed This Visit       Cardiovascular and Mediastinum   Essential hypertension    Restart lisinopril 40 mg one-half tablet once daily.          Other   Intra-abdominal fluid collection - Primary    Check labs.       Relevant Orders   CBC with Differential (Completed)   Comprehensive metabolic panel (Completed)  .  Meds ordered this encounter  Medications   ondansetron (ZOFRAN) 4 MG tablet    Sig: Take 1 tablet (4 mg total) by mouth  every 8 (eight) hours as needed for nausea or vomiting.    Dispense:  20 tablet    Refill:  0    Orders Placed This Encounter  Procedures   CBC with Differential   Comprehensive metabolic panel     Follow-up: Return in about 4 weeks (around 08/29/2021) for chronic follow up .  An After Visit Summary was printed and given to the patient.  Geralynn Ochs I Leal-Borjas,acting as a scribe for [name], MD.,have documented all relevant documentation on the behalf of [name], MD,as directed by  [name], MD while in the presence of [name], MD.   [name], MD [place] [phone]

## 2021-08-01 NOTE — Assessment & Plan Note (Addendum)
Uncontrolled.  Restart lisinopril 40 mg one-half tablet once daily.

## 2021-08-02 LAB — COMPREHENSIVE METABOLIC PANEL
ALT: 13 IU/L (ref 0–44)
AST: 13 IU/L (ref 0–40)
Albumin/Globulin Ratio: 1.2 (ref 1.2–2.2)
Albumin: 4 g/dL (ref 3.8–4.9)
Alkaline Phosphatase: 78 IU/L (ref 44–121)
BUN/Creatinine Ratio: 10 (ref 9–20)
BUN: 11 mg/dL (ref 6–24)
Bilirubin Total: <0.2 mg/dL (ref 0.0–1.2)
CO2: 24 mmol/L (ref 20–29)
Calcium: 9.5 mg/dL (ref 8.7–10.2)
Chloride: 104 mmol/L (ref 96–106)
Creatinine, Ser: 1.11 mg/dL (ref 0.76–1.27)
Globulin, Total: 3.4 g/dL (ref 1.5–4.5)
Glucose: 73 mg/dL (ref 70–99)
Potassium: 3.8 mmol/L (ref 3.5–5.2)
Sodium: 142 mmol/L (ref 134–144)
Total Protein: 7.4 g/dL (ref 6.0–8.5)
eGFR: 77 mL/min/{1.73_m2} (ref >59)

## 2021-08-02 LAB — CBC WITH DIFFERENTIAL/PLATELET
Basophils Absolute: 0.1 10*3/uL (ref 0.0–0.2)
Basos: 1 %
EOS (ABSOLUTE): 0.2 10*3/uL (ref 0.0–0.4)
Eos: 2 %
Hematocrit: 37.6 % (ref 37.5–51.0)
Hemoglobin: 11.7 g/dL — ABNORMAL LOW (ref 13.0–17.7)
Immature Grans (Abs): 0 10*3/uL (ref 0.0–0.1)
Immature Granulocytes: 0 %
Lymphocytes Absolute: 3.6 10*3/uL — ABNORMAL HIGH (ref 0.7–3.1)
Lymphs: 39 %
MCH: 27.1 pg (ref 26.6–33.0)
MCHC: 31.1 g/dL — ABNORMAL LOW (ref 31.5–35.7)
MCV: 87 fL (ref 79–97)
Monocytes Absolute: 0.9 10*3/uL (ref 0.1–0.9)
Monocytes: 10 %
Neutrophils Absolute: 4.5 10*3/uL (ref 1.4–7.0)
Neutrophils: 48 %
Platelets: 403 10*3/uL (ref 150–450)
RBC: 4.32 x10E6/uL (ref 4.14–5.80)
RDW: 15.8 % — ABNORMAL HIGH (ref 11.6–15.4)
WBC: 9.2 10*3/uL (ref 3.4–10.8)

## 2021-08-02 LAB — AMYLASE, BODY FLUID (OTHER): Amylase, Body Fluid: 63 U/L

## 2021-08-05 DIAGNOSIS — K8591 Acute pancreatitis with uninfected necrosis, unspecified: Secondary | ICD-10-CM | POA: Diagnosis not present

## 2021-08-06 NOTE — Assessment & Plan Note (Signed)
Check labs. Continue drainage.

## 2021-08-08 ENCOUNTER — Encounter: Payer: Self-pay | Admitting: Family Medicine

## 2021-08-09 ENCOUNTER — Ambulatory Visit
Admission: RE | Admit: 2021-08-09 | Discharge: 2021-08-09 | Disposition: A | Discharge status: Home / Self Care | Payer: Medicare HMO | Source: Ambulatory Visit | Attending: Surgery | Admitting: Surgery

## 2021-08-09 ENCOUNTER — Other Ambulatory Visit: Payer: Self-pay | Admitting: Surgery

## 2021-08-09 ENCOUNTER — Ambulatory Visit
Admission: RE | Admit: 2021-08-09 | Discharge: 2021-08-09 | Disposition: A | Discharge status: Home / Self Care | Payer: Medicare HMO | Source: Ambulatory Visit | Attending: Student | Admitting: Student

## 2021-08-09 ENCOUNTER — Encounter: Payer: Self-pay | Admitting: Radiology

## 2021-08-09 DIAGNOSIS — R188 Other ascites: Secondary | ICD-10-CM

## 2021-08-09 DIAGNOSIS — K632 Fistula of intestine: Secondary | ICD-10-CM | POA: Diagnosis not present

## 2021-08-09 DIAGNOSIS — K651 Peritoneal abscess: Secondary | ICD-10-CM | POA: Diagnosis not present

## 2021-08-09 DIAGNOSIS — K5730 Diverticulosis of large intestine without perforation or abscess without bleeding: Secondary | ICD-10-CM | POA: Diagnosis not present

## 2021-08-09 DIAGNOSIS — K859 Acute pancreatitis without necrosis or infection, unspecified: Secondary | ICD-10-CM | POA: Diagnosis not present

## 2021-08-09 HISTORY — PX: IR RADIOLOGIST EVAL & MGMT: IMG5224

## 2021-08-09 MED ORDER — IOPAMIDOL (ISOVUE-300) INJECTION 61%
100.0000 mL | Freq: Once | INTRAVENOUS | Status: AC | PRN
  Administered 2021-08-09: 100 mL via INTRAVENOUS

## 2021-08-09 NOTE — Progress Notes (Signed)
Referring Physician(s): Allen,Shelby L  Chief Complaint: The patient is seen in follow up today s/p RLQ drain placement 07/21/21.   History of present illness:  Scott Foley, 56 year old male, has a medical history significant for CAD, HTN, recurrent DVT/PE on Eliquis, COPD, stroke, diverticulosis and ERCP pancreatitis with infected necrosis who initially presented to IR 04/18/21 for percutaneous drain placement. He followed up at the [facility]iology outpatient clinic 05/25/21 and CT imaging showed interval improvement in the fluid collection with no evidence for a fistula. The drain was also injected that day with no evidence for a fistula. The decision was made to leave the drain in place due to persistent opaque output and mild leukocytosis. The drain was removed by Dr. Zenia Resides on 05/27/21 during a clinic visit.   Scott Foley presented to the [facility] ED 07/18/21 with complaints of abdominal pain and fevers. CT abdomen/pelvis showed a right lower quadrant fluid collection and he received a new 12 French drain 07/21/21. He was discharged home from the hospital 07/23/21.  Scott Foley presents today to the outpatient clinic for drain evaluation including CT imaging and possible drain injection. He is accompanied by his wife. He has been flushing the drain twice daily with 5 ml NS and he reports a daily output of approximately 10 ml of serosanguineous fluid. He denies any fevers, chills, pain, nausea/vomiting or diarrhea. He is not currently taking any oral antibiotics.   Past Medical History:  Diagnosis Date   Acquired thrombophilia ([employer]) 12/06/2020   Acute pancreatitis after endoscopic retrograde cholangiopancreatography (ERCP) 03/03/2021   ERCP by Dr Odie Sera in [facility] 03/03/2021   AKI (acute kidney injury) ([place]) 04/13/2021   Chest pain 02/09/2021   Chronic obstructive pulmonary disease ([place]) 07/16/2018   Chronic respiratory failure with hypoxia ([place]) 04/13/2021   Colon polyp 10/25/2020    serrated adenoma.   Coronary artery disease due to lipid rich plaque 01/01/2021   Coronary artery disease with stable angina pectoris [facility])    AMI No heart cath, treated medically [place] [facility]   Diverticulosis of colon 10/25/2020   Noted on colnoscopy by Dr Vicente Males in [place].   DNR (do not resuscitate) 03/03/2019   Hyperlipemia    Hypertension    Hypertensive heart disease without congestive heart failure 12/06/2020   Morbid obesity ([place]) 12/06/2020   BMI 35 with serious comorbidities including CAD, HTN, STROKE, Hyperlipidemia.   Necrotizing pancreatitis 04/13/2021   Need for vaccination 12/15/2019   Pulmonary embolus ([place]) 03/03/2019   Pulmonary nodules/lesions, multiple 11/25/2012   9/15/2014CT Chest : small nodule RUL LUL. Stable since 03/2012 Smoking cessation advised and Rx chantix Repeat CT Chest 4 /21/15: subpleural lymph nodes, likely benign repeat one year 2016>>>no change, considered benign Arlyce Harman 11/25/2012  NORMAL   Sepsis ([place]) 04/13/2021   Sequela, post-stroke 07/28/2019   Tobacco use disorder 11/25/2012    Past Surgical History:  Procedure Laterality Date   COLONOSCOPY WITH PROPOFOL N/A 10/25/2020   Jonathon Bellows, MD, at [facility]. 25 MM serrated adenoma polyp at ascending colon removed and site clipped.  Pan diverticulosis.   IR RADIOLOGIST EVAL & MGMT  05/25/2021   LAPAROSCOPIC CHOLECYSTECTOMY  02/2021   PERICARDIOCENTESIS N/A 05/01/2021   Procedure: PERICARDIOCENTESIS;  Surgeon: Martinique, Peter M, MD;  Location: [place] CV LAB;  Service: Cardiovascular;  Laterality: N/A;   TONSILLECTOMY      Allergies: Penicillin g  Medications: Prior to Admission medications   Medication Sig Start Date End Date Taking? Authorizing Provider  albuterol (VENTOLIN HFA) 108 (  90 Base) MCG/ACT inhaler Inhale 2 puffs into the lungs every 6 (six) hours as needed for wheezing or shortness of breath. 04/04/21   Rip Harbour, NP  apixaban (ELIQUIS) 5 MG TABS tablet TAKE ONE TABLET BY MOUTH EVERY  MORNING and TAKE ONE TABLET BY MOUTH EVERY EVENING 07/26/21   [name], Elnita Maxwell, MD  clopidogrel (PLAVIX) 75 MG tablet Take 1 tablet (75 mg total) by mouth daily. 05/19/21   [name], [name], MD  Evolocumab (REPATHA) 140 MG/ML SOSY Inject 140 mg into the skin every 14 (fourteen) days. 05/19/21   [name], Elnita Maxwell, MD  Fluticasone-Umeclidin-Vilant (TRELEGY ELLIPTA) 100-62.5-25 MCG/ACT AEPB Inhale 1 puff into the lungs daily. 05/19/21   [name], Elnita Maxwell, MD  lisinopril (ZESTRIL) 20 MG tablet Take 1 tablet (20 mg total) by mouth daily. 08/01/21   CoxElnita Maxwell, MD  metoprolol tartrate (LOPRESSOR) 50 MG tablet Take 1 tablet (50 mg total) by mouth 2 (two) times daily. 05/19/21   CoxElnita Maxwell, MD  Multiple Vitamin (MULTIVITAMIN WITH MINERALS) TABS tablet Take 1 tablet by mouth daily. 05/11/21   Hosie Poisson, MD  Omega-3 Fatty Acids (FISH OIL) 1000 MG CAPS Take 1 capsule (1,000 mg total) by mouth 2 (two) times daily. 11/30/20   [name], Elnita Maxwell, MD  ondansetron (ZOFRAN) 4 MG tablet Take 1 tablet (4 mg total) by mouth every 8 (eight) hours as needed for nausea or vomiting. 08/01/21   [name], [name], MD  pantoprazole (PROTONIX) 40 MG tablet TAKE ONE TABLET BY MOUTH EVERY MORNING and TAKE ONE TABLET BY MOUTH EVERY EVENING Patient taking differently: 40 mg 2 (two) times daily. 07/14/21   [name], Elnita Maxwell, MD  potassium chloride SA (KLOR-CON M) 20 MEQ tablet Take 1 tablet (20 mEq total) by mouth daily. 05/27/21   [name], Elnita Maxwell, MD  rosuvastatin (CRESTOR) 40 MG tablet Take 1 tablet (40 mg total) by mouth at bedtime. 05/19/21   [name], [name], MD  sodium chloride 0.9 % injection Inject 5 mLs into the vein every 12 (twelve) hours. Flush abdominal drain with 5 mL normal saline every 12 hours 07/23/21 10/21/21  [place] Donnamarie Poag, DO  vitamin B-12 (CYANOCOBALAMIN) 1000 MCG tablet TAKE ONE TABLET BY MOUTH EVERY MORNING Patient taking differently: Take 1,000 mcg by mouth daily. 07/14/21   CoxElnita Maxwell, MD     Family History  Problem Relation Age of Onset   Hypertension  Father    Heart attack Father    Stroke Father    Prostate cancer Father    Hypertension Sister    Hypertension Brother    Heart attack Brother    Lung cancer Sister     Social History   Socioeconomic History   Marital status: Married    Spouse name: Not on file   Number of children: Not on file   Years of education: Not on file   Highest education level: Not on file  Occupational History   Not on file  Tobacco Use   Smoking status: Former    Packs/day: 0.75    Years: 30.00    Pack years: 22.50    Types: Cigarettes    Start date: 03/14/1979    Quit date: 11/2020    Years since quitting: 0.7   Smokeless tobacco: Never  Vaping Use   Vaping Use: Former   Start date: 07/28/2018  Substance and Sexual Activity   Alcohol use: No   Drug use: No   Sexual activity: Yes    Partners: Female  Other Topics Concern   Not on file  Social History Narrative   Not on file   Social Determinants of Health   Financial Resource Strain: Medium Risk   Difficulty of Paying Living Expenses: Somewhat hard  Food Insecurity: Not on file  Transportation Needs: No Transportation Needs   Lack of Transportation (Medical): No   Lack of Transportation (Non-Medical): No  Physical Activity: Not on file  Stress: Not on file  Social Connections: Not on file     Vital Signs: There were no vitals taken for this visit.  Physical Exam Constitutional:      General: He is not in acute distress.    Appearance: He is not ill-appearing.  Pulmonary:     Effort: Pulmonary effort is normal.  Abdominal:     Palpations: Abdomen is soft.     Tenderness: There is no abdominal tenderness.     Comments: RLQ drain to suction. Approximately 10 ml of serosanguineous fluid in bulb. Skin insertion site is slightly erythematous. No drainage observed.   Skin:    General: Skin is warm and dry.  Neurological:     Mental Status: He is alert and oriented to person, place, and time.    Imaging: No results  found.  Labs:  CBC: Recent Labs    07/21/21 0601 07/22/21 0152 07/23/21 0154 08/01/21 1128  WBC 9.4 9.2 9.5 9.2  HGB 10.3* 9.5* 9.9* 11.7*  HCT 31.5* 30.8* 31.2* 37.6  PLT 319 341 319 403    COAGS: Recent Labs    04/15/21 0153 04/15/21 0909 04/15/21 2008 04/16/21 0649 04/17/21 0341 07/21/21 0601  INR 1.6*  --   --  1.4* 1.5* 1.2  APTT  --  62* 69* 74* 65*  --     BMP: Recent Labs    07/20/21 0145 07/21/21 0601 07/22/21 0152 07/23/21 0154 08/01/21 1128  NA 134* 137 134* 133* 142  K 3.8 3.7 3.4* 3.5 3.8  CL 106 108 103 106 104  CO2 '23 23 24 '$ 20* 24  GLUCOSE 99 102* 114* 101* 73  BUN '15 19 19 19 11  '$ CALCIUM 8.6* 9.0 8.7* 8.7* 9.5  CREATININE 1.06 1.16 1.54* 0.98 1.11  GFRNONAA >60 >60 52* >60  --     LIVER FUNCTION TESTS: Recent Labs    07/19/21 0235 07/20/21 0145 07/21/21 0601 08/01/21 1128  BILITOT 1.1 0.4 0.4 <0.2  AST 12* 10* 12* 13  ALT '10 10 12 13  '$ ALKPHOS 59 62 65 78  PROT 6.7 6.3* 6.8 7.4  ALBUMIN 2.8* 2.5* 2.4* 4.0    Assessment:  History of pancreatitis with intra-abdominal fluid collections; RLQ drain placements 04/18/21 (removed 05/27/21 by Surgeon) and 07/21/21.   CT imaging today shows near resolution of the previously identified RLQ fluid collection. Drain injection with contrast showed communication with the remaining fluid in the RLQ with no evidence of a colonic fistula. Please see full dictations of these imaging studies under the Image tab in Epic.  Although the patient is feeling well and reporting a low daily output of 10 ml, out of an abundance of caution the drain will remain for a few more weeks. We will plan to see the patient back in two weeks for a repeat drain evaluation with CT imaging.  The patient was instructed to flush the drain only once daily with 5 ml NS. The drain site was re-dressed with a new stat-lock, gauze and tape.   The patient and his wife know they can call the clinic with any questions or concerns prior to  their next visit.    Signed: Theresa Duty, NP 08/09/2021, 1:31 PM   Please refer to Dr. Moises Blood attestation of this note for management and plan.

## 2021-08-10 ENCOUNTER — Other Ambulatory Visit: Payer: Self-pay

## 2021-08-10 DIAGNOSIS — Z87891 Personal history of nicotine dependence: Secondary | ICD-10-CM

## 2021-08-10 DIAGNOSIS — I10 Essential (primary) hypertension: Secondary | ICD-10-CM

## 2021-08-10 DIAGNOSIS — E785 Hyperlipidemia, unspecified: Secondary | ICD-10-CM | POA: Diagnosis not present

## 2021-08-10 DIAGNOSIS — J449 Chronic obstructive pulmonary disease, unspecified: Secondary | ICD-10-CM

## 2021-08-10 NOTE — Progress Notes (Signed)
The proposed treatment discussed in conference is for discussion purpose only and is not a binding recommendation.  The patients have not been physically examined, or presented with their treatment options.  Therefore, final treatment plans cannot be decided.  

## 2021-08-16 ENCOUNTER — Telehealth: Payer: Self-pay

## 2021-08-16 NOTE — Progress Notes (Signed)
Chronic Care Management Pharmacy Assistant   Name: Scott Foley  MRN: [id number] DOB: 11-07-1965   Reason for Encounter: Medication Coordination for [place]stream   Recent office visits:  08/01/21 [name] MD. Seen for Hospital follow up. Started on Ondansetron HCI '4mg'$ . Take lisinopril 40 mg one-half tablet daily.   Recent consult visits:  08/05/21 (General Surgery) Miguel Dibble MD. Seen for follow up. No med changes.   Hospital visits:  None  Medications: Outpatient Encounter Medications as of 08/16/2021  Medication Sig   albuterol (VENTOLIN HFA) 108 (90 Base) MCG/ACT inhaler Inhale 2 puffs into the lungs every 6 (six) hours as needed for wheezing or shortness of breath.   apixaban (ELIQUIS) 5 MG TABS tablet TAKE ONE TABLET BY MOUTH EVERY MORNING and TAKE ONE TABLET BY MOUTH EVERY EVENING   clopidogrel (PLAVIX) 75 MG tablet Take 1 tablet (75 mg total) by mouth daily.   Evolocumab (REPATHA) 140 MG/ML SOSY Inject 140 mg into the skin every 14 (fourteen) days.   Fluticasone-Umeclidin-Vilant (TRELEGY ELLIPTA) 100-62.5-25 MCG/ACT AEPB Inhale 1 puff into the lungs daily.   lisinopril (ZESTRIL) 20 MG tablet Take 1 tablet (20 mg total) by mouth daily.   metoprolol tartrate (LOPRESSOR) 50 MG tablet Take 1 tablet (50 mg total) by mouth 2 (two) times daily.   Multiple Vitamin (MULTIVITAMIN WITH MINERALS) TABS tablet Take 1 tablet by mouth daily.   Omega-3 Fatty Acids (FISH OIL) 1000 MG CAPS Take 1 capsule (1,000 mg total) by mouth 2 (two) times daily.   ondansetron (ZOFRAN) 4 MG tablet Take 1 tablet (4 mg total) by mouth every 8 (eight) hours as needed for nausea or vomiting.   pantoprazole (PROTONIX) 40 MG tablet TAKE ONE TABLET BY MOUTH EVERY MORNING and TAKE ONE TABLET BY MOUTH EVERY EVENING (Patient taking differently: 40 mg 2 (two) times daily.)   potassium chloride SA (KLOR-CON M) 20 MEQ tablet Take 1 tablet (20 mEq total) by mouth daily.   rosuvastatin (CRESTOR) 40 MG  tablet Take 1 tablet (40 mg total) by mouth at bedtime.   sodium chloride 0.9 % injection Inject 5 mLs into the vein every 12 (twelve) hours. Flush abdominal drain with 5 mL normal saline every 12 hours   vitamin B-12 (CYANOCOBALAMIN) 1000 MCG tablet TAKE ONE TABLET BY MOUTH EVERY MORNING (Patient taking differently: Take 1,000 mcg by mouth daily.)   No facility-administered encounter medications on file as of 08/16/2021.    Reviewed chart for medication changes ahead of medication coordination call.  No hospital visits since last care coordination call/Pharmacist visit.   BP Readings from Last 3 Encounters:  08/09/21 140/88  08/01/21 (!) 148/90  07/23/21 (!) 136/97    No results found for: HGBA1C   Patient obtains medications through Adherence Packaging  30 Days   Last adherence delivery included:  Eliquis '5mg'$  1 at B and 1 EM Clopidogrel '75mg'$  1 B Vitamin B12 1084mg 1 B Trelegy Inhaler 1053m 1 puff once daily  Metoprolol Tar '50mg'$  1 B and 1 EM Mens Multi 1 at EM Fish Oil '1000mg'$  1 B and 1 EM Pantoprazole '40mg'$  1 B and 1 EM Rosuvastatin '40mg'$  1 EM   Patient declined (meds) last month  Repatha '140mg'$  Inject '140mg'$  every 14 days- Cost  and wants to discuss with CPP on f.u call on 07/27/21 Sodium Chloride- [place] get [place] through [facility] [place]. Wants transferred Potassium Chloride 2055m Received from [place] 05/27/21 90ds  Patient is due for next adherence delivery on: 08/26/21. Called patient and reviewed medications and coordinated delivery.  This delivery to include: Eliquis '5mg'$  1 at B and 1 EM Clopidogrel '75mg'$  1 B Vitamin B12 1045mg 1 B Trelegy Inhaler 1040m 1 puff once daily  Metoprolol Tar '50mg'$  1 B and 1 EM Mens Multi 1 at EM Fish Oil '1000mg'$  1 B and 1 EM Pantoprazole '40mg'$  1 B and 1 EM Rosuvastatin '40mg'$  1 EM Sodium Chloride- Inject 5 mLs into the vein every 12 (twelve) hours. Flush abdominal drain with 5 mL normal saline every 12  hours Potassium Chloride 2036m 1 in B  Patient declined the following medications Repatha '140mg'$ - Pt is not on this  Patient needs refills  None  Confirmed delivery date of 08/26/21, advised patient that pharmacy will contact them the morning of delivery   DanElray McgregorMAAmbrosearmacist Assistant  [phone]

## 2021-08-17 NOTE — Telephone Encounter (Signed)
Compliant on meds, add lisinopril to list

## 2021-08-19 DIAGNOSIS — K9189 Other postprocedural complications and disorders of digestive system: Secondary | ICD-10-CM | POA: Diagnosis not present

## 2021-08-19 DIAGNOSIS — K859 Acute pancreatitis without necrosis or infection, unspecified: Secondary | ICD-10-CM | POA: Diagnosis not present

## 2021-08-23 ENCOUNTER — Ambulatory Visit
Admission: RE | Admit: 2021-08-23 | Discharge: 2021-08-23 | Disposition: A | Discharge status: Home / Self Care | Payer: Medicare HMO | Source: Ambulatory Visit | Attending: Surgery | Admitting: Surgery

## 2021-08-23 DIAGNOSIS — K7689 Other specified diseases of liver: Secondary | ICD-10-CM | POA: Diagnosis not present

## 2021-08-23 DIAGNOSIS — R188 Other ascites: Secondary | ICD-10-CM

## 2021-08-23 DIAGNOSIS — N3289 Other specified disorders of bladder: Secondary | ICD-10-CM | POA: Diagnosis not present

## 2021-08-23 DIAGNOSIS — K859 Acute pancreatitis without necrosis or infection, unspecified: Secondary | ICD-10-CM | POA: Diagnosis not present

## 2021-08-23 DIAGNOSIS — K651 Peritoneal abscess: Secondary | ICD-10-CM | POA: Diagnosis not present

## 2021-08-23 DIAGNOSIS — I714 Abdominal aortic aneurysm, without rupture, unspecified: Secondary | ICD-10-CM | POA: Diagnosis not present

## 2021-08-23 DIAGNOSIS — Z4682 Encounter for fitting and adjustment of non-vascular catheter: Secondary | ICD-10-CM | POA: Diagnosis not present

## 2021-08-23 HISTORY — PX: IR RADIOLOGIST EVAL & MGMT: IMG5224

## 2021-08-23 MED ORDER — IOPAMIDOL (ISOVUE-300) INJECTION 61%
100.0000 mL | Freq: Once | INTRAVENOUS | Status: AC | PRN
  Administered 2021-08-23: 100 mL via INTRAVENOUS

## 2021-08-23 NOTE — Progress Notes (Signed)
Referring Physician(s): Allen,Shelby L  Chief Complaint: The patient is seen in follow up today s/p RLQ drain placement 07/21/21.     History of present illness:  Hx Pancreatitis Recurrent RLQ abscess drain Drain placed in  IR 04/18/21 On 3/15 pt was evaluated at [facility] CT revealed resolved collection and NO fistula- but kept drain in place secondary copious OP and cloudy nature Drain was ultimately removed in [place] with Dr Ayesha Rumpf 05/27/21 Redevelopment of symptoms with recurrent collection noted on imaging IR placed new drain 07/21/21 OP IR Clinic visit 5/30-- CT again with resolution but +communication to collection--shown with contrast injection Drain to remain  Return Clinic visit today and drain evaluation  Flushing 5 cc daily OP is 10 cc daily Op is brown; cloudy; blood tinged material Denies abd pain Denies fever/chills  Was seen with Dr Zenia Resides just last week Drain to remain per Dr Zenia Resides   Past Medical History:  Diagnosis Date   Acquired thrombophilia ([place]) 12/06/2020   Acute pancreatitis after endoscopic retrograde cholangiopancreatography (ERCP) 03/03/2021   ERCP by Dr Odie Sera in [place] 03/03/2021   AKI (acute kidney injury) ([place]) 04/13/2021   Chest pain 02/09/2021   Chronic obstructive pulmonary disease ([place]) 07/16/2018   Chronic respiratory failure with hypoxia ([place]) 04/13/2021   Colon polyp 10/25/2020   serrated adenoma.   Coronary artery disease due to lipid rich plaque 01/01/2021   Coronary artery disease with stable angina pectoris [facility])    AMI No heart cath, treated medically [place] [facility]   Diverticulosis of colon 10/25/2020   Noted on colnoscopy by Dr Vicente Males in [place].   DNR (do not resuscitate) 03/03/2019   Hyperlipemia    Hypertension    Hypertensive heart disease without congestive heart failure 12/06/2020   Morbid obesity ([place]) 12/06/2020   BMI 35 with serious comorbidities including CAD, HTN, STROKE, Hyperlipidemia.   Necrotizing  pancreatitis 04/13/2021   Need for vaccination 12/15/2019   Pulmonary embolus ([place]) 03/03/2019   Pulmonary nodules/lesions, multiple 11/25/2012   9/15/2014CT Chest : small nodule RUL LUL. Stable since 03/2012 Smoking cessation advised and Rx chantix Repeat CT Chest 4 /21/15: subpleural lymph nodes, likely benign repeat one year 2016>>>no change, considered benign Arlyce Harman 11/25/2012  NORMAL   Sepsis ([place]) 04/13/2021   Sequela, post-stroke 07/28/2019   Tobacco use disorder 11/25/2012    Past Surgical History:  Procedure Laterality Date   COLONOSCOPY WITH PROPOFOL N/A 10/25/2020   Jonathon Bellows, MD, at [facility]. 25 MM serrated adenoma polyp at ascending colon removed and site clipped.  Pan diverticulosis.   IR RADIOLOGIST EVAL & MGMT  05/25/2021   IR RADIOLOGIST EVAL & MGMT  08/09/2021   LAPAROSCOPIC CHOLECYSTECTOMY  02/2021   PERICARDIOCENTESIS N/A 05/01/2021   Procedure: PERICARDIOCENTESIS;  Surgeon: Martinique, Peter M, MD;  Location: [place] CV LAB;  Service: Cardiovascular;  Laterality: N/A;   TONSILLECTOMY      Allergies: Penicillin g  Medications: Prior to Admission medications   Medication Sig Start Date End Date Taking? Authorizing Provider  albuterol (VENTOLIN HFA) 108 (90 Base) MCG/ACT inhaler Inhale 2 puffs into the lungs every 6 (six) hours as needed for wheezing or shortness of breath. 04/04/21   Rip Harbour, NP  apixaban (ELIQUIS) 5 MG TABS tablet TAKE ONE TABLET BY MOUTH EVERY MORNING and TAKE ONE TABLET BY MOUTH EVERY EVENING 07/26/21   [name], Elnita Maxwell, MD  clopidogrel (PLAVIX) 75 MG tablet Take 1 tablet (75 mg total) by mouth daily. 05/19/21   [name],  [name], MD  Evolocumab (REPATHA) 140 MG/ML SOSY Inject 140 mg into the skin every 14 (fourteen) days. 05/19/21   [name], Elnita Maxwell, MD  Fluticasone-Umeclidin-Vilant (TRELEGY ELLIPTA) 100-62.5-25 MCG/ACT AEPB Inhale 1 puff into the lungs daily. 05/19/21   [name], Elnita Maxwell, MD  lisinopril (ZESTRIL) 20 MG tablet Take 1 tablet (20 mg total) by mouth daily.  08/01/21   CoxElnita Maxwell, MD  metoprolol tartrate (LOPRESSOR) 50 MG tablet Take 1 tablet (50 mg total) by mouth 2 (two) times daily. 05/19/21   CoxElnita Maxwell, MD  Multiple Vitamin (MULTIVITAMIN WITH MINERALS) TABS tablet Take 1 tablet by mouth daily. 05/11/21   Hosie Poisson, MD  Omega-3 Fatty Acids (FISH OIL) 1000 MG CAPS Take 1 capsule (1,000 mg total) by mouth 2 (two) times daily. 11/30/20   [name], Elnita Maxwell, MD  ondansetron (ZOFRAN) 4 MG tablet Take 1 tablet (4 mg total) by mouth every 8 (eight) hours as needed for nausea or vomiting. 08/01/21   [name], [name], MD  pantoprazole (PROTONIX) 40 MG tablet TAKE ONE TABLET BY MOUTH EVERY MORNING and TAKE ONE TABLET BY MOUTH EVERY EVENING Patient taking differently: 40 mg 2 (two) times daily. 07/14/21   [name], Elnita Maxwell, MD  potassium chloride SA (KLOR-CON M) 20 MEQ tablet Take 1 tablet (20 mEq total) by mouth daily. 05/27/21   [name], Elnita Maxwell, MD  rosuvastatin (CRESTOR) 40 MG tablet Take 1 tablet (40 mg total) by mouth at bedtime. 05/19/21   [name], [name], MD  sodium chloride 0.9 % injection Inject 5 mLs into the vein every 12 (twelve) hours. Flush abdominal drain with 5 mL normal saline every 12 hours 07/23/21 10/21/21  [place] Donnamarie Poag, DO  vitamin B-12 (CYANOCOBALAMIN) 1000 MCG tablet TAKE ONE TABLET BY MOUTH EVERY MORNING Patient taking differently: Take 1,000 mcg by mouth daily. 07/14/21   CoxElnita Maxwell, MD     Family History  Problem Relation Age of Onset   Hypertension Father    Heart attack Father    Stroke Father    Prostate cancer Father    Hypertension Sister    Hypertension Brother    Heart attack Brother    Lung cancer Sister     Social History   Socioeconomic History   Marital status: Married    Spouse name: Not on file   Number of children: Not on file   Years of education: Not on file   Highest education level: Not on file  Occupational History   Not on file  Tobacco Use   Smoking status: Former    Packs/day: 0.75    Years: 30.00    Total pack  years: 22.50    Types: Cigarettes    Start date: 03/14/1979    Quit date: 11/2020    Years since quitting: 0.7   Smokeless tobacco: Never  Vaping Use   Vaping Use: Former   Start date: 07/28/2018  Substance and Sexual Activity   Alcohol use: No   Drug use: No   Sexual activity: Yes    Partners: Female  Other Topics Concern   Not on file  Social History Narrative   Not on file   Social Determinants of Health   Financial Resource Strain: Medium Risk (07/27/2021)   Overall Financial Resource Strain (CARDIA)    Difficulty of Paying Living Expenses: Somewhat hard  Food Insecurity: Not on file  Transportation Needs: No Transportation Needs (07/27/2021)   PRAPARE - Hydrologist (Medical): No    Lack of Transportation (Non-Medical): No  Physical Activity: Not on file  Stress: Not on file  Social Connections: Not on file     Vital Signs: There were no vitals taken for this visit.  Physical Exam Skin:    General: Skin is warm.     Comments: Site is clean and dry NT no bleeding OP brownish/cloudy/blood tinged fluid 10 cc in JP  Drain injection shows No Change since 5/230--- + communication to small collection      Imaging: No results found.  Labs:  CBC: Recent Labs    07/21/21 0601 07/22/21 0152 07/23/21 0154 08/01/21 1128  WBC 9.4 9.2 9.5 9.2  HGB 10.3* 9.5* 9.9* 11.7*  HCT 31.5* 30.8* 31.2* 37.6  PLT 319 341 319 403    COAGS: Recent Labs    04/15/21 0153 04/15/21 0909 04/15/21 2008 04/16/21 0649 04/17/21 0341 07/21/21 0601  INR 1.6*  --   --  1.4* 1.5* 1.2  APTT  --  62* 69* 74* 65*  --     BMP: Recent Labs    07/20/21 0145 07/21/21 0601 07/22/21 0152 07/23/21 0154 08/01/21 1128  NA 134* 137 134* 133* 142  K 3.8 3.7 3.4* 3.5 3.8  CL 106 108 103 106 104  CO2 '23 23 24 '$ 20* 24  GLUCOSE 99 102* 114* 101* 73  BUN '15 19 19 19 11  '$ CALCIUM 8.6* 9.0 8.7* 8.7* 9.5  CREATININE 1.06 1.16 1.54* 0.98 1.11  GFRNONAA >60  >60 52* >60  --     LIVER FUNCTION TESTS: Recent Labs    07/19/21 0235 07/20/21 0145 07/21/21 0601 08/01/21 1128  BILITOT 1.1 0.4 0.4 <0.2  AST 12* 10* 12* 13  ALT '10 10 12 13  '$ ALKPHOS 59 62 65 78  PROT 6.7 6.3* 6.8 7.4  ALBUMIN 2.8* 2.5* 2.4* 4.0    Assessment:  Pancreatitis with recurrent RLQ abscess Drain in place CT today showing further resolution of abscess--- only very small area remains per Dr Charlynn Court  injection does continue to show small communication to collection Plan: drain to remain--- changed to gravity bag for now Continue 3-5 cc flush daily Pt to see Dr Zenia Resides July 7th To return to [facility] weeks--- for drain injection only. Pt and wife have good understanding of plan   Signed: Janann Colonel 08/23/2021, 12:41 PM   Please refer to Dr. Anselm Pancoast attestation of this note for management and plan.

## 2021-08-23 NOTE — Progress Notes (Signed)
Note from the pharmacy: the sodium chloride is not covered on insurance through [place], I think it can only be billed on part B and we cant bill part B. So he will have to keep getting that from [place]. I did not transfer that script because we already had it on file so [facility] still has it.  Called pt and informed him. Pt understood.  Scott Foley, [place] Pharmacist Assistant  [phone]

## 2021-08-24 ENCOUNTER — Other Ambulatory Visit: Payer: Self-pay | Admitting: Surgery

## 2021-08-24 ENCOUNTER — Encounter: Payer: Self-pay | Admitting: Family Medicine

## 2021-08-24 ENCOUNTER — Ambulatory Visit (INDEPENDENT_AMBULATORY_CARE_PROVIDER_SITE_OTHER): Payer: Medicare HMO | Admitting: Family Medicine

## 2021-08-24 VITALS — BP 140/88 | HR 56 | Temp 97.4°F | Resp 18 | Ht 64.0 in | Wt 182.0 lb

## 2021-08-24 DIAGNOSIS — K651 Peritoneal abscess: Secondary | ICD-10-CM

## 2021-08-24 DIAGNOSIS — I10 Essential (primary) hypertension: Secondary | ICD-10-CM | POA: Diagnosis not present

## 2021-08-24 NOTE — Progress Notes (Signed)
Subjective:  Patient ID: Scott Foley, male    DOB: 01-Oct-1965  Age: 56 y.o. MRN: [id number]  Chief Complaint  Patient presents with   Chronic Kidney Disease   COPD   Hypertension    HPI Hypertension: Patient is on lisinopril 40 mg 1/2 pill daily.  He started an appointment Aug 01, 2021.  Denies chest pain, breathing problems.   Current Outpatient Medications on File Prior to Visit   Current Outpatient Medications on File Prior to Visit  Medication Sig Dispense Refill   albuterol (VENTOLIN HFA) 108 (90 Base) MCG/ACT inhaler Inhale 2 puffs into the lungs every 6 (six) hours as needed for wheezing or shortness of breath. 8 g 0   apixaban (ELIQUIS) 5 MG TABS tablet TAKE ONE TABLET BY MOUTH EVERY MORNING and TAKE ONE TABLET BY MOUTH EVERY EVENING 180 tablet 0   clopidogrel (PLAVIX) 75 MG tablet Take 1 tablet (75 mg total) by mouth daily. 90 tablet 0   Evolocumab (REPATHA) 140 MG/ML SOSY Inject 140 mg into the skin every 14 (fourteen) days. 6 mL 0   Fluticasone-Umeclidin-Vilant (TRELEGY ELLIPTA) 100-62.5-25 MCG/ACT AEPB Inhale 1 puff into the lungs daily. 1 each 11   lisinopril (ZESTRIL) 20 MG tablet Take 1 tablet (20 mg total) by mouth daily. 90 tablet 0   metoprolol tartrate (LOPRESSOR) 50 MG tablet Take 1 tablet (50 mg total) by mouth 2 (two) times daily. 180 tablet 0   Multiple Vitamin (MULTIVITAMIN WITH MINERALS) TABS tablet Take 1 tablet by mouth daily.     Omega-3 Fatty Acids (FISH OIL) 1000 MG CAPS Take 1 capsule (1,000 mg total) by mouth 2 (two) times daily. 180 capsule 0   pantoprazole (PROTONIX) 40 MG tablet TAKE ONE TABLET BY MOUTH EVERY MORNING and TAKE ONE TABLET BY MOUTH EVERY EVENING (Patient taking differently: 40 mg 2 (two) times daily.) 60 tablet 1   potassium chloride SA (KLOR-CON M) 20 MEQ tablet Take 1 tablet (20 mEq total) by mouth daily. 30 tablet 3   rosuvastatin (CRESTOR) 40 MG tablet Take 1 tablet (40 mg total) by mouth at bedtime. 90 tablet 0   vitamin  B-12 (CYANOCOBALAMIN) 1000 MCG tablet TAKE ONE TABLET BY MOUTH EVERY MORNING (Patient taking differently: Take 1,000 mcg by mouth daily.) 30 tablet 1   ondansetron (ZOFRAN) 4 MG tablet Take 1 tablet (4 mg total) by mouth every 8 (eight) hours as needed for nausea or vomiting. (Patient not taking: Reported on 08/24/2021) 20 tablet 0   sodium chloride 0.9 % injection Inject 5 mLs into the vein every 12 (twelve) hours. Flush abdominal drain with 5 mL normal saline every 12 hours (Patient taking differently: Inject 3 mLs into the vein every 12 (twelve) hours. Flush abdominal drain with 5 mL normal saline every 12 hours) 300 mL 2   No current facility-administered medications on file prior to visit.   Past Medical History:  Diagnosis Date   Acquired thrombophilia (HCC) 12/06/2020   Acute pancreatitis after endoscopic retrograde cholangiopancreatography (ERCP) 03/03/2021   ERCP by Dr Braulio Conte in [facility] 03/03/2021   AKI (acute kidney injury) (HCC) 04/13/2021   Chest pain 02/09/2021   Chronic obstructive pulmonary disease (HCC) 07/16/2018   Chronic respiratory failure with hypoxia (HCC) 04/13/2021   Colon polyp 10/25/2020   serrated adenoma.   Coronary artery disease due to lipid rich plaque 01/01/2021   Coronary artery disease with stable angina pectoris [facility])    AMI No heart cath, treated medically [facility]   Diverticulosis  of colon 10/25/2020   Noted on colnoscopy by Dr Tobi Bastos in [place].   DNR (do not resuscitate) 03/03/2019   Hyperlipemia    Hypertension    Hypertensive heart disease without congestive heart failure 12/06/2020   Morbid obesity (HCC) 12/06/2020   BMI 35 with serious comorbidities including CAD, HTN, STROKE, Hyperlipidemia.   Necrotizing pancreatitis 04/13/2021   Need for vaccination 12/15/2019   Pulmonary embolus (HCC) 03/03/2019   Pulmonary nodules/lesions, multiple 11/25/2012   9/15/2014CT Chest : small nodule RUL LUL. Stable since 03/2012 Smoking cessation advised and Rx  chantix Repeat CT Chest 4 /21/15: subpleural lymph nodes, likely benign repeat one year 2016>>>no change, considered benign Cleda Daub 11/25/2012  NORMAL   Sepsis (HCC) 04/13/2021   Sequela, post-stroke 07/28/2019   Tobacco use disorder 11/25/2012   Past Surgical History:  Procedure Laterality Date   COLONOSCOPY WITH PROPOFOL N/A 10/25/2020   Wyline Mood, MD, at [facility]. 25 MM serrated adenoma polyp at ascending colon removed and site clipped.  Pan diverticulosis.   IR RADIOLOGIST EVAL & MGMT  05/25/2021   IR RADIOLOGIST EVAL & MGMT  08/09/2021   IR RADIOLOGIST EVAL & MGMT  08/23/2021   LAPAROSCOPIC CHOLECYSTECTOMY  02/2021   PERICARDIOCENTESIS N/A 05/01/2021   Procedure: PERICARDIOCENTESIS;  Surgeon: [place], Peter M, MD;  Location: [facility] INVASIVE CV LAB;  Service: Cardiovascular;  Laterality: N/A;   TONSILLECTOMY      Family History  Problem Relation Age of Onset   Hypertension Father    Heart attack Father    Stroke Father    Prostate cancer Father    Hypertension Sister    Hypertension Brother    Heart attack Brother    Lung cancer Sister    Social History   Socioeconomic History   Marital status: Married    Spouse name: Not on file   Number of children: Not on file   Years of education: Not on file   Highest education level: Not on file  Occupational History   Not on file  Tobacco Use   Smoking status: Former    Packs/day: 0.75    Years: 30.00    Total pack years: 22.50    Types: Cigarettes    Start date: 03/14/1979    Quit date: 11/2020    Years since quitting: 0.8   Smokeless tobacco: Never  Vaping Use   Vaping Use: Former   Start date: 07/28/2018  Substance and Sexual Activity   Alcohol use: No   Drug use: No   Sexual activity: Yes    Partners: Female  Other Topics Concern   Not on file  Social History Narrative   Not on file   Social Determinants of Health   Financial Resource Strain: Medium Risk (07/27/2021)   Overall Financial Resource Strain (CARDIA)     Difficulty of Paying Living Expenses: Somewhat hard  Food Insecurity: Not on file  Transportation Needs: No Transportation Needs (07/27/2021)   PRAPARE - Administrator, Civil Service (Medical): No    Lack of Transportation (Non-Medical): No  Physical Activity: Not on file  Stress: Not on file  Social Connections: Not on file    Review of Systems  Constitutional:  Positive for fatigue. Negative for chills and fever.  HENT:  Negative for congestion, rhinorrhea and sore throat.   Respiratory:  Negative for cough and shortness of breath.   Cardiovascular:  Negative for chest pain and palpitations.  Gastrointestinal:  Negative for abdominal pain, constipation, diarrhea, nausea and vomiting.  Genitourinary:  Negative for dysuria and urgency.  Musculoskeletal:  Negative for arthralgias, back pain and myalgias.  Neurological:  Negative for dizziness and headaches.  Psychiatric/Behavioral:  Negative for dysphoric mood. The patient is not nervous/anxious.      Objective:  BP 140/88   Pulse (!) 56   Temp (!) 97.4 F (36.3 C)   Resp 18   Ht 5\' 4"  (1.626 m)   Wt 182 lb (82.6 kg)   BMI 31.24 kg/m      08/24/2021    9:28 AM 08/23/2021    1:00 PM 08/09/2021    1:00 PM  BP/Weight  Systolic BP 140 187 140  Diastolic BP 88 105 88  Wt. (Lbs) 182    BMI 31.24 kg/m2      Physical Exam Vitals reviewed.  Constitutional:      Appearance: Normal appearance.  Neck:     Vascular: No carotid bruit.  Cardiovascular:     Rate and Rhythm: Normal rate and regular rhythm.     Heart sounds: Normal heart sounds.  Pulmonary:     Effort: Pulmonary effort is normal.     Breath sounds: Normal breath sounds. No wheezing, rhonchi or rales.  Neurological:     Mental Status: He is alert and oriented to person, place, and time.  Psychiatric:        Mood and Affect: Mood normal.        Behavior: Behavior normal.     Diabetic Foot Exam - Simple   No data filed      Lab Results   Component Value Date   WBC 9.2 08/01/2021   HGB 11.7 (L) 08/01/2021   HCT 37.6 08/01/2021   PLT 403 08/01/2021   GLUCOSE 88 08/24/2021   CHOL 101 07/04/2021   TRIG 143 07/04/2021   HDL 26 (L) 07/04/2021   LDLCALC 50 07/04/2021   ALT 16 08/24/2021   AST 14 08/24/2021   NA 141 08/24/2021   K 4.5 08/24/2021   CL 101 08/24/2021   CREATININE 1.13 08/24/2021   BUN 24 08/24/2021   CO2 22 08/24/2021   TSH 1.870 11/25/2020   INR 1.2 07/21/2021      Assessment & Plan:   Problem List Items Addressed This Visit       Cardiovascular and Mediastinum   Essential hypertension - Primary    Increase lisinopril to 40 mg daily after labs come back because blood pressure is too high. Recommend check blood pressure daily. Lab drawn today.      Relevant Orders   Comprehensive metabolic panel (Completed)  .  No orders of the defined types were placed in this encounter.   Orders Placed This Encounter  Procedures   Comprehensive metabolic panel     Follow-up: Return in about 3 months (around 11/24/2021) for chronic fasting, awv with Selena Batten  in one month..  An After Visit Summary was printed and given to the patient.  Blane Ohara, MD [facility] [phone]

## 2021-08-24 NOTE — Patient Instructions (Signed)
I will likely increase lisinopril to 40 mg daily after labs come back because blood pressure is too high. Recommend check blood pressure daily.

## 2021-08-25 LAB — COMPREHENSIVE METABOLIC PANEL
ALT: 16 IU/L (ref 0–44)
AST: 14 IU/L (ref 0–40)
Albumin/Globulin Ratio: 1.4 (ref 1.2–2.2)
Albumin: 4.5 g/dL (ref 3.8–4.9)
Alkaline Phosphatase: 80 IU/L (ref 44–121)
BUN/Creatinine Ratio: 21 — ABNORMAL HIGH (ref 9–20)
BUN: 24 mg/dL (ref 6–24)
Bilirubin Total: <0.2 mg/dL (ref 0.0–1.2)
CO2: 22 mmol/L (ref 20–29)
Calcium: 10.1 mg/dL (ref 8.7–10.2)
Chloride: 101 mmol/L (ref 96–106)
Creatinine, Ser: 1.13 mg/dL (ref 0.76–1.27)
Globulin, Total: 3.2 g/dL (ref 1.5–4.5)
Glucose: 88 mg/dL (ref 70–99)
Potassium: 4.5 mmol/L (ref 3.5–5.2)
Sodium: 141 mmol/L (ref 134–144)
Total Protein: 7.7 g/dL (ref 6.0–8.5)
eGFR: 76 mL/min/{1.73_m2} (ref >59)

## 2021-08-31 ENCOUNTER — Other Ambulatory Visit: Payer: Self-pay | Admitting: Surgery

## 2021-08-31 ENCOUNTER — Telehealth: Payer: Self-pay

## 2021-08-31 ENCOUNTER — Other Ambulatory Visit (HOSPITAL_COMMUNITY): Payer: Self-pay

## 2021-08-31 DIAGNOSIS — K859 Acute pancreatitis without necrosis or infection, unspecified: Secondary | ICD-10-CM

## 2021-08-31 MED ORDER — NORMAL SALINE FLUSH 0.9 % IV SOLN
INTRAVENOUS | 1 refills | Status: DC
  Filled 2021-08-31: qty 250, 25d supply, fill #0

## 2021-08-31 NOTE — Telephone Encounter (Signed)
[facility] had called with concerns about saline for drain irrigation.  They are having problems with getting the saline.  According to [facility] there is problems with finding availability.  They have reached out to the surgeons office for assistance.  She will call us back if needed.

## 2021-09-01 ENCOUNTER — Ambulatory Visit
Admission: RE | Admit: 2021-09-01 | Discharge: 2021-09-01 | Disposition: A | Discharge status: Home / Self Care | Payer: Medicare HMO | Source: Ambulatory Visit | Attending: Surgery | Admitting: Surgery

## 2021-09-01 ENCOUNTER — Ambulatory Visit: Payer: Medicare HMO | Admitting: Family Medicine

## 2021-09-01 DIAGNOSIS — R188 Other ascites: Secondary | ICD-10-CM | POA: Diagnosis not present

## 2021-09-01 DIAGNOSIS — K859 Acute pancreatitis without necrosis or infection, unspecified: Secondary | ICD-10-CM

## 2021-09-01 DIAGNOSIS — I700 Atherosclerosis of aorta: Secondary | ICD-10-CM | POA: Diagnosis not present

## 2021-09-01 DIAGNOSIS — R935 Abnormal findings on diagnostic imaging of other abdominal regions, including retroperitoneum: Secondary | ICD-10-CM | POA: Diagnosis not present

## 2021-09-01 DIAGNOSIS — I714 Abdominal aortic aneurysm, without rupture, unspecified: Secondary | ICD-10-CM | POA: Diagnosis not present

## 2021-09-01 MED ORDER — GADOBENATE DIMEGLUMINE 529 MG/ML IV SOLN
15.0000 mL | Freq: Once | INTRAVENOUS | Status: AC | PRN
  Administered 2021-09-01: 15 mL via INTRAVENOUS

## 2021-09-01 NOTE — Assessment & Plan Note (Addendum)
Increase lisinopril to 40 mg daily after labs come back because blood pressure is too high. Recommend check blood pressure daily. Lab drawn today.

## 2021-09-12 ENCOUNTER — Ambulatory Visit (INDEPENDENT_AMBULATORY_CARE_PROVIDER_SITE_OTHER): Payer: Medicare HMO

## 2021-09-12 VITALS — BP 122/80 | HR 62 | Resp 16 | Ht 64.0 in | Wt 182.2 lb

## 2021-09-12 DIAGNOSIS — Z0000 Encounter for general adult medical examination without abnormal findings: Secondary | ICD-10-CM

## 2021-09-12 NOTE — Patient Instructions (Signed)
Scott Foley , Thank you for taking time to come for your Medicare Wellness Visit. I appreciate your ongoing commitment to your health goals. Please review the following plan we discussed and let me know if I can assist you in the future.   Screening recommendations/referrals: Recommended yearly ophthalmology/optometry visit for glaucoma screening and checkup Recommended yearly dental visit for hygiene and checkup  Vaccinations: Influenza vaccine: Due Fall 2023 Pneumococcal vaccine: complete Tdap vaccine: Due - can get at pharmacy Shingles vaccine: Due - can get at pharmacy    Conditions/risks identified: Continue to monitor blood pressure   Preventive Care 40-64 Years, Male Preventive care refers to lifestyle choices and visits with your health care provider that can promote health and wellness. What does preventive care include? A yearly physical exam. This is also called an annual well check. Dental exams once or twice a year. Routine eye exams. Ask your health care provider how often you should have your eyes checked. Personal lifestyle choices, including: Daily care of your teeth and gums. Regular physical activity. Eating a healthy diet. Avoiding tobacco and drug use. Limiting alcohol use. Practicing safe sex. Taking low-dose aspirin every day starting at age 92. What happens during an annual well check? The services and screenings done by your health care provider during your annual well check will depend on your age, overall health, lifestyle risk factors, and family history of disease. Counseling  Your health care provider may ask you questions about your: Alcohol use. Tobacco use. Drug use. Emotional well-being. Home and relationship well-being. Sexual activity. Eating habits. Work and work Statistician. Screening  You may have the following tests or measurements: Height, weight, and BMI. Blood pressure. Lipid and cholesterol levels. These may be checked every 5  years, or more frequently if you are over 73 years old. Skin check. Lung cancer screening. You may have this screening every year starting at age 33 if you have a 30-pack-year history of smoking and currently smoke or have quit within the past 15 years. Fecal occult blood test (FOBT) of the stool. You may have this test every year starting at age 52. Flexible sigmoidoscopy or colonoscopy. You may have a sigmoidoscopy every 5 years or a colonoscopy every 10 years starting at age 40. Prostate cancer screening. Recommendations will vary depending on your family history and other risks. Hepatitis C blood test. Hepatitis B blood test. Sexually transmitted disease (STD) testing. Diabetes screening. This is done by checking your blood sugar (glucose) after you have not eaten for a while (fasting). You may have this done every 1-3 years. Discuss your test results, treatment options, and if necessary, the need for more tests with your health care provider. Vaccines  Your health care provider may recommend certain vaccines, such as: Influenza vaccine. This is recommended every year. Tetanus, diphtheria, and acellular pertussis (Tdap, Td) vaccine. You may need a Td booster every 10 years. Zoster vaccine. You may need this after age 71. Pneumococcal 13-valent conjugate (PCV13) vaccine. You may need this if you have certain conditions and have not been vaccinated. Pneumococcal polysaccharide (PPSV23) vaccine. You may need one or two doses if you smoke cigarettes or if you have certain conditions. Talk to your health care provider about which screenings and vaccines you need and how often you need them. This information is not intended to replace advice given to you by your health care provider. Make sure you discuss any questions you have with your health care provider. Document Released: 03/26/2015 Document Revised: 11/17/2015  Document Reviewed: 12/29/2014 Elsevier Interactive Patient Education  2017  [place] Prevention in the Home Falls can cause injuries. They can happen to people of all ages. There are many things you can do to make your home safe and to help prevent falls. What can I do on the outside of my home? Regularly fix the edges of walkways and driveways and fix any cracks. Remove anything that might make you trip as you walk through a door, such as a raised step or threshold. Trim any bushes or trees on the path to your home. Use bright outdoor lighting. Clear any walking paths of anything that might make someone trip, such as rocks or tools. Regularly check to see if handrails are loose or broken. Make sure that both sides of any steps have handrails. Any raised decks and porches should have guardrails on the edges. Have any leaves, snow, or ice cleared regularly. Use sand or salt on walking paths during winter. Clean up any spills in your garage right away. This includes oil or grease spills. What can I do in the bathroom? Use night lights. Install grab bars by the toilet and in the tub and shower. Do not use towel bars as grab bars. Use non-skid mats or decals in the tub or shower. If you need to sit down in the shower, use a plastic, non-slip stool. Keep the floor dry. Clean up any water that spills on the floor as soon as it happens. Remove soap buildup in the tub or shower regularly. Attach bath mats securely with double-sided non-slip rug tape. Do not have throw rugs and other things on the floor that can make you trip. What can I do in the bedroom? Use night lights. Make sure that you have a light by your bed that is easy to reach. Do not use any sheets or blankets that are too big for your bed. They should not hang down onto the floor. Have a firm chair that has side arms. You can use this for support while you get dressed. Do not have throw rugs and other things on the floor that can make you trip. What can I do in the kitchen? Clean up any spills  right away. Avoid walking on wet floors. Keep items that you use a lot in easy-to-reach places. If you need to reach something above you, use a strong step stool that has a grab bar. Keep electrical cords out of the way. Do not use floor polish or wax that makes floors slippery. If you must use wax, use non-skid floor wax. Do not have throw rugs and other things on the floor that can make you trip. What can I do with my stairs? Do not leave any items on the stairs. Make sure that there are handrails on both sides of the stairs and use them. Fix handrails that are broken or loose. Make sure that handrails are as long as the stairways. Check any carpeting to make sure that it is firmly attached to the stairs. Fix any carpet that is loose or worn. Avoid having throw rugs at the top or bottom of the stairs. If you do have throw rugs, attach them to the floor with carpet tape. Make sure that you have a light switch at the top of the stairs and the bottom of the stairs. If you do not have them, ask someone to add them for you. What else can I do to help prevent falls? Wear shoes that:  Do not have high heels. Have rubber bottoms. Are comfortable and fit you well. Are closed at the toe. Do not wear sandals. If you use a stepladder: Make sure that it is fully opened. Do not climb a closed stepladder. Make sure that both sides of the stepladder are locked into place. Ask someone to hold it for you, if possible. Clearly mark and make sure that you can see: Any grab bars or handrails. First and last steps. Where the edge of each step is. Use tools that help you move around (mobility aids) if they are needed. These include: Canes. Walkers. Scooters. Crutches. Turn on the lights when you go into a dark area. Replace any light bulbs as soon as they burn out. Set up your furniture so you have a clear path. Avoid moving your furniture around. If any of your floors are uneven, fix them. If there are  any pets around you, be aware of where they are. Review your medicines with your doctor. Some medicines can make you feel dizzy. This can increase your chance of falling. Ask your doctor what other things that you can do to help prevent falls. This information is not intended to replace advice given to you by your health care provider. Make sure you discuss any questions you have with your health care provider. Document Released: 12/24/2008 Document Revised: 08/05/2015 Document Reviewed: 04/03/2014 Elsevier Interactive Patient Education  2017 [employer].

## 2021-09-12 NOTE — Progress Notes (Signed)
Subjective:   Scott Foley is a 56 y.o. male who presents for Medicare Annual/Subsequent preventive examination.  This wellness visit is conducted by a nurse.  The patient's medications were reviewed and reconciled since the patient's last visit.  History details were provided by the patient and his wife Dawn.  The history appears to be reliable.    Medical History: Patient history and Family history was reviewed  Medications, Allergies, and preventative health maintenance was reviewed and updated.   Review of Systems    No complaints Cardiac Risk Factors include: advanced age (>29mn, >>61women);male gender;hypertension;obesity (BMI >30kg/m2)     Objective:    Today's Vitals   09/12/21 1328  BP: 122/80  Pulse: 62  Resp: 16  Weight: 182 lb 3.2 oz (82.6 kg)  Height: '5\' 4"'$  (1.626 m)  PainSc: 0-No pain   Body mass index is 31.27 kg/m.     04/14/2021    6:06 PM 02/20/2021   11:47 AM 02/09/2021    2:12 AM 10/25/2020    7:45 AM 08/19/2019    1:38 PM 04/26/2019    8:40 AM 03/03/2019    8:23 PM  Advanced Directives  Does Patient Have a Medical Advance Directive? No No No No No No No  Would patient like information on creating a medical advance directive? No - Patient declined No - Patient declined No - Patient declined  Yes (MAU/Ambulatory/Procedural Areas - Information given) No - Patient declined No - Patient declined    Current Medications (verified) Outpatient Encounter Medications as of 09/12/2021  Medication Sig   albuterol (VENTOLIN HFA) 108 (90 Base) MCG/ACT inhaler Inhale 2 puffs into the lungs every 6 (six) hours as needed for wheezing or shortness of breath.   apixaban (ELIQUIS) 5 MG TABS tablet TAKE ONE TABLET BY MOUTH EVERY MORNING and TAKE ONE TABLET BY MOUTH EVERY EVENING   clopidogrel (PLAVIX) 75 MG tablet Take 1 tablet (75 mg total) by mouth daily.   Evolocumab (REPATHA) 140 MG/ML SOSY Inject 140 mg into the skin every 14 (fourteen) days.    Fluticasone-Umeclidin-Vilant (TRELEGY ELLIPTA) 100-62.5-25 MCG/ACT AEPB Inhale 1 puff into the lungs daily.   lisinopril (ZESTRIL) 20 MG tablet Take 1 tablet (20 mg total) by mouth daily. (Patient taking differently: Take 40 mg by mouth daily.)   metoprolol tartrate (LOPRESSOR) 50 MG tablet Take 1 tablet (50 mg total) by mouth 2 (two) times daily.   Multiple Vitamin (MULTIVITAMIN WITH MINERALS) TABS tablet Take 1 tablet by mouth daily.   Omega-3 Fatty Acids (FISH OIL) 1000 MG CAPS Take 1 capsule (1,000 mg total) by mouth 2 (two) times daily.   ondansetron (ZOFRAN) 4 MG tablet Take 1 tablet (4 mg total) by mouth every 8 (eight) hours as needed for nausea or vomiting.   pantoprazole (PROTONIX) 40 MG tablet TAKE ONE TABLET BY MOUTH EVERY MORNING and TAKE ONE TABLET BY MOUTH EVERY EVENING (Patient taking differently: 40 mg 2 (two) times daily.)   potassium chloride SA (KLOR-CON M) 20 MEQ tablet Take 1 tablet (20 mEq total) by mouth daily.   rosuvastatin (CRESTOR) 40 MG tablet Take 1 tablet (40 mg total) by mouth at bedtime.   sodium chloride 0.9 % injection Inject 5 mLs into the vein every 12 (twelve) hours. Flush abdominal drain with 5 mL normal saline every 12 hours (Patient taking differently: Inject 3 mLs into the vein every 12 (twelve) hours. Flush abdominal drain with 5 mL normal saline every 12 hours)   Sodium Chloride  Flush (NORMAL SALINE FLUSH) 0.9 % SOLN Use As directed   vitamin B-12 (CYANOCOBALAMIN) 1000 MCG tablet TAKE ONE TABLET BY MOUTH EVERY MORNING (Patient taking differently: Take 1,000 mcg by mouth daily.)   No facility-administered encounter medications on file as of 09/12/2021.    Allergies (verified) Penicillin g   History: Past Medical History:  Diagnosis Date   Acquired thrombophilia ([place]) 12/06/2020   Acute pancreatitis after endoscopic retrograde cholangiopancreatography (ERCP) 03/03/2021   ERCP by Dr Odie Sera in [facility] 03/03/2021   AKI (acute kidney injury) ([place])  04/13/2021   Chest pain 02/09/2021   Chronic obstructive pulmonary disease ([place]) 07/16/2018   Chronic respiratory failure with hypoxia ([place]) 04/13/2021   Colon polyp 10/25/2020   serrated adenoma.   Coronary artery disease due to lipid rich plaque 01/01/2021   Coronary artery disease with stable angina pectoris [facility])    AMI No heart cath, treated medically [facility] [facility]   Diverticulosis of colon 10/25/2020   Noted on colnoscopy by Dr Vicente Males in [place].   DNR (do not resuscitate) 03/03/2019   Hyperlipemia    Hypertension    Hypertensive heart disease without congestive heart failure 12/06/2020   Morbid obesity ([place]) 12/06/2020   BMI 35 with serious comorbidities including CAD, HTN, STROKE, Hyperlipidemia.   Necrotizing pancreatitis 04/13/2021   Need for vaccination 12/15/2019   Pulmonary embolus ([place]) 03/03/2019   Pulmonary nodules/lesions, multiple 11/25/2012   9/15/2014CT Chest : small nodule RUL LUL. Stable since 03/2012 Smoking cessation advised and Rx chantix Repeat CT Chest 4 /21/15: subpleural lymph nodes, likely benign repeat one year 2016>>>no change, considered benign Arlyce Harman 11/25/2012  NORMAL   Sepsis ([place]) 04/13/2021   Sequela, post-stroke 07/28/2019   Tobacco use disorder 11/25/2012   Past Surgical History:  Procedure Laterality Date   COLONOSCOPY WITH PROPOFOL N/A 10/25/2020   Jonathon Bellows, MD, at [facility]. 25 MM serrated adenoma polyp at ascending colon removed and site clipped.  Pan diverticulosis.   IR RADIOLOGIST EVAL & MGMT  05/25/2021   IR RADIOLOGIST EVAL & MGMT  08/09/2021   IR RADIOLOGIST EVAL & MGMT  08/23/2021   LAPAROSCOPIC CHOLECYSTECTOMY  02/2021   PERICARDIOCENTESIS N/A 05/01/2021   Procedure: PERICARDIOCENTESIS;  Surgeon: Martinique, Peter M, MD;  Location: [facility];  Service: Cardiovascular;  Laterality: N/A;   TONSILLECTOMY     Family History  Problem Relation Age of Onset   Hypertension Father    Heart attack Father    Stroke Father    Prostate cancer Father     Hypertension Sister    Hypertension Brother    Heart attack Brother    Lung cancer Sister    Social History   Socioeconomic History   Marital status: Married    Spouse name: Dawn  Tobacco Use   Smoking status: Former    Packs/day: 0.75    Years: 30.00    Total pack years: 22.50    Types: Cigarettes    Start date: 03/14/1979    Quit date: 11/2020    Years since quitting: 0.8   Smokeless tobacco: Never  Vaping Use   Vaping Use: Former   Start date: 07/28/2018  Substance and Sexual Activity   Alcohol use: No   Drug use: No   Sexual activity: Yes    Partners: Female   Social Determinants of Health   Financial Resource Strain: Medium Risk (07/27/2021)   Overall Financial Resource Strain (CARDIA)    Difficulty of Paying Living Expenses: Somewhat hard  Food Insecurity: Not on  file  Transportation Needs: No Transportation Needs (07/27/2021)   PRAPARE - Hydrologist (Medical): No    Lack of Transportation (Non-Medical): No  Physical Activity: Not on file  Stress: Not on file  Social Connections: Not on file    Tobacco Counseling Counseling given: Patient no longer uses tobacco/nicotine products   Clinical Intake:  Pre-visit preparation completed: Yes Pain : No/denies pain Pain Score: 0-No pain   BMI - recorded: 31.27 Nutritional Status: BMI > 30  Obese Diabetes: No How often do you need to have someone help you when you read instructions, pamphlets, or other written materials from your doctor or pharmacy?: 2 - Rarely Interpreter Needed?: No     Activities of Daily Living    09/12/2021    2:02 PM 04/14/2021    6:06 PM  In your present state of health, do you have any difficulty performing the following activities:  Hearing? 0 0  Vision? 0 0  Difficulty concentrating or making decisions? 0 0  Walking or climbing stairs? 0 0  Dressing or bathing? 0 1  Doing errands, shopping? 0 1  Preparing Food and eating ? N   Using the Toilet? N    In the past six months, have you accidently leaked urine? N   Do you have problems with loss of bowel control? N   Managing your Medications? N   Managing your Finances? N   Housekeeping or managing your Housekeeping? N     Patient Care Team: [name], MD as PCP - General (Family Medicine) Sanda Klein, MD as PCP - Cardiology (Cardiology) Lane Hacker, [facility] as Pharmacist (Pharmacist)  Indicate any recent Medical Services you may have received from other than [facility] providers in the past year (date may be approximate). [facility]    Assessment:   This is a routine wellness examination for Devine.  Dietary issues and exercise activities discussed: Current Exercise Habits: Home exercise routine, Type of exercise: walking, Time (Minutes): 30, Frequency (Times/Week): 5, Weekly Exercise (Minutes/Week): 150, Intensity: Mild  Depression Screen    09/12/2021    2:02 PM 06/22/2021    3:16 PM 12/30/2020    1:37 PM 08/23/2020    8:58 AM 08/19/2019    2:05 PM 07/28/2019    9:53 AM  PHQ 2/9 Scores  PHQ - 2 Score 0 0 0 0 0 0    Fall Risk    09/12/2021    2:01 PM 08/01/2021   10:53 AM 06/22/2021    3:16 PM 12/30/2020    1:37 PM 08/19/2019    1:38 PM  Fall Risk   Falls in the past year? 1 1 0 0 1  Comment     Slipped on Ice  Number falls in past yr: 0 0 0 0 0  Injury with Fall? 0 0 0 0 1  Comment     broken ribs  Risk for fall due to : No Fall Risks  No Fall Risks No Fall Risks No Fall Risks  Follow up Falls evaluation completed;Falls prevention discussed Falls evaluation completed Falls evaluation completed Falls evaluation completed Falls evaluation completed;Falls prevention discussed    FALL RISK PREVENTION PERTAINING TO THE HOME:  Any stairs in or around the home? Yes  If so, are there any without handrails? No  Home free of loose throw rugs in walkways, pet beds, electrical cords, etc? Yes  Adequate lighting in your home to reduce risk of falls? Yes  ASSISTIVE DEVICES UTILIZED TO PREVENT FALLS:  Life alert? No  Use of a cane, walker or w/c? No  Grab bars in the bathroom? No  Shower chair or bench in shower? No  Elevated toilet seat or a handicapped toilet? No   Gait steady and fast without use of assistive device  Cognitive Function:        08/19/2019    2:07 PM  6CIT Screen  What Year? 0 points  What month? 0 points  What time? 0 points  Count back from 20 0 points  Months in reverse 4 points  Repeat phrase 4 points  Total Score 8 points    Immunizations Immunization History  Administered Date(s) Administered   Influenza Inj Mdck Quad Pf 12/15/2019, 12/30/2020   Influenza Split 01/03/2012   Influenza,inj,Quad PF,6+ Mos 11/25/2012   PNEUMOCOCCAL CONJUGATE-20 12/30/2020   Pneumococcal Polysaccharide-23 11/25/2012    TDAP status: Due, Education has been provided regarding the importance of this vaccine. Advised may receive this vaccine at local pharmacy or Health Dept. Aware to provide a copy of the vaccination record if obtained from local pharmacy or Health Dept. Verbalized acceptance and understanding.  Flu Vaccine status: Up to date  Pneumococcal vaccine status: Up to date  Covid-19 vaccine status: Declined, Education has been provided regarding the importance of this vaccine but patient still declined. Advised may receive this vaccine at local pharmacy or Health Dept.or vaccine clinic. Aware to provide a copy of the vaccination record if obtained from local pharmacy or Health Dept. Verbalized acceptance and understanding.  Qualifies for Shingles Vaccine? Yes   Zostavax completed No   Shingrix Completed?: No.    Education has been provided regarding the importance of this vaccine. Patient has been advised to call insurance company to determine out of pocket expense if they have not yet received this vaccine. Advised may also receive vaccine at local pharmacy or Health Dept. Verbalized acceptance and  understanding.  Screening Tests Health Maintenance  Topic Date Due   TETANUS/TDAP  Never done   Zoster Vaccines- Shingrix (1 of 2) Never done   COVID-19 Vaccine (1) 07/21/2022 (Originally 11/03/1965)   INFLUENZA VACCINE  10/11/2021   COLONOSCOPY (Pts 45-13yr Insurance coverage will need to be confirmed)  10/26/2030   Hepatitis C Screening  Completed   HIV Screening  Completed   HPV VACCINES  Aged Out    Health Maintenance  Health Maintenance Due  Topic Date Due   TETANUS/TDAP  Never done   Zoster Vaccines- Shingrix (1 of 2) Never done    Colorectal cancer screening: Type of screening: Colonoscopy. Completed 10/2020.   Additional Screening:  Vision Screening: Recommended annual ophthalmology exams for early detection of glaucoma and other disorders of the eye. Is the patient up to date with their annual eye exam?  No   Dental Screening: Recommended annual dental exams for proper oral hygiene    Plan:     1- Tetanus and Shingrix vaccines are recommended 2- Continue healthy diet and exercise as tolerated 3- Continue to monitor BP, bring a log to your appointments and call if you notice any symptoms or readings that are not normal  I have personally reviewed and noted the following in the patient's chart:   Medical and social history Use of alcohol, tobacco or illicit drugs  Current medications and supplements including opioid prescriptions.  Functional ability and status Nutritional status Physical activity Advanced directives List of other physicians Hospitalizations, surgeries, and ER visits in previous 12 months Vitals  Screenings to include cognitive, depression, and falls Referrals and appointments  In addition, I have reviewed and discussed with patient certain preventive protocols, quality metrics, and best practice recommendations. A written personalized care plan for preventive services as well as general preventive health recommendations were provided to  patient.     Erie Noe, LPN   07/18/9726

## 2021-09-15 ENCOUNTER — Other Ambulatory Visit: Payer: Self-pay

## 2021-09-15 ENCOUNTER — Telehealth: Payer: Self-pay

## 2021-09-15 DIAGNOSIS — E782 Mixed hyperlipidemia: Secondary | ICD-10-CM

## 2021-09-15 DIAGNOSIS — I2782 Chronic pulmonary embolism: Secondary | ICD-10-CM

## 2021-09-15 DIAGNOSIS — I119 Hypertensive heart disease without heart failure: Secondary | ICD-10-CM

## 2021-09-15 MED ORDER — CLOPIDOGREL BISULFATE 75 MG PO TABS: 75.0000 mg | ORAL_TABLET | Freq: Every day | ORAL | 0 refills | Status: DC

## 2021-09-15 MED ORDER — VITAMIN B-12 1000 MCG PO TABS
1000.0000 ug | ORAL_TABLET | Freq: Every morning | ORAL | 1 refills | Status: DC
Start: 2021-09-15 — End: 2021-11-13

## 2021-09-15 MED ORDER — POTASSIUM CHLORIDE CRYS ER 20 MEQ PO TBCR: 20.0000 meq | EXTENDED_RELEASE_TABLET | Freq: Every day | ORAL | 3 refills | Status: DC

## 2021-09-15 MED ORDER — PANTOPRAZOLE SODIUM 40 MG PO TBEC: DELAYED_RELEASE_TABLET | ORAL | 1 refills | Status: DC

## 2021-09-15 MED ORDER — ROSUVASTATIN CALCIUM 40 MG PO TABS: 40.0000 mg | ORAL_TABLET | Freq: Every day | ORAL | 0 refills | Status: DC

## 2021-09-15 MED ORDER — METOPROLOL TARTRATE 50 MG PO TABS: 50.0000 mg | ORAL_TABLET | Freq: Two times a day (BID) | ORAL | 0 refills | Status: DC

## 2021-09-15 NOTE — Progress Notes (Signed)
Chronic Care Management Pharmacy Assistant   Name: Quinto Tippy  MRN: [id number] DOB: 1965/04/05   Reason for Encounter: Medication Coordination for [place]    Recent office visits:  09/12/21 Rhae Hammock LPN. Medicare Annual Wellness. No med changes.   08/24/21 [name] MD. Seen for CKD, COPD and HTN. I will likely increase lisinopril to 40 mg daily after labs come back because blood pressure is too high.  Recent consult visits:  08/19/21 ([facility]) Miguel Dibble MD. Seen for Post op. No med changes.  Hospital visits:  None  Medications: Outpatient Encounter Medications as of 09/15/2021  Medication Sig   albuterol (VENTOLIN HFA) 108 (90 Base) MCG/ACT inhaler Inhale 2 puffs into the lungs every 6 (six) hours as needed for wheezing or shortness of breath.   apixaban (ELIQUIS) 5 MG TABS tablet TAKE ONE TABLET BY MOUTH EVERY MORNING and TAKE ONE TABLET BY MOUTH EVERY EVENING   clopidogrel (PLAVIX) 75 MG tablet Take 1 tablet (75 mg total) by mouth daily.   Evolocumab (REPATHA) 140 MG/ML SOSY Inject 140 mg into the skin every 14 (fourteen) days.   Fluticasone-Umeclidin-Vilant (TRELEGY ELLIPTA) 100-62.5-25 MCG/ACT AEPB Inhale 1 puff into the lungs daily.   lisinopril (ZESTRIL) 20 MG tablet Take 1 tablet (20 mg total) by mouth daily. (Patient taking differently: Take 40 mg by mouth daily.)   metoprolol tartrate (LOPRESSOR) 50 MG tablet Take 1 tablet (50 mg total) by mouth 2 (two) times daily.   Multiple Vitamin (MULTIVITAMIN WITH MINERALS) TABS tablet Take 1 tablet by mouth daily.   Omega-3 Fatty Acids (FISH OIL) 1000 MG CAPS Take 1 capsule (1,000 mg total) by mouth 2 (two) times daily.   ondansetron (ZOFRAN) 4 MG tablet Take 1 tablet (4 mg total) by mouth every 8 (eight) hours as needed for nausea or vomiting.   pantoprazole (PROTONIX) 40 MG tablet TAKE ONE TABLET BY MOUTH EVERY MORNING and TAKE ONE TABLET BY MOUTH EVERY EVENING (Patient taking differently: 40 mg 2  (two) times daily.)   potassium chloride SA (KLOR-CON M) 20 MEQ tablet Take 1 tablet (20 mEq total) by mouth daily.   rosuvastatin (CRESTOR) 40 MG tablet Take 1 tablet (40 mg total) by mouth at bedtime.   sodium chloride 0.9 % injection Inject 5 mLs into the vein every 12 (twelve) hours. Flush abdominal drain with 5 mL normal saline every 12 hours (Patient taking differently: Inject 3 mLs into the vein every 12 (twelve) hours. Flush abdominal drain with 5 mL normal saline every 12 hours)   Sodium Chloride Flush (NORMAL SALINE FLUSH) 0.9 % SOLN Use As directed   vitamin B-12 (CYANOCOBALAMIN) 1000 MCG tablet TAKE ONE TABLET BY MOUTH EVERY MORNING (Patient taking differently: Take 1,000 mcg by mouth daily.)   No facility-administered encounter medications on file as of 09/15/2021.    Reviewed chart for medication changes ahead of medication coordination call.  No hospital visits since last care coordination call/Pharmacist visit.   No medication changes indicated OR if recent visit, treatment plan here.  BP Readings from Last 3 Encounters:  09/12/21 122/80  08/24/21 140/88  08/23/21 (!) 187/105    No results found for: "HGBA1C"   Patient obtains medications through Adherence Packaging  30 Days   Last adherence delivery included: Eliquis '5mg'$  1 at B and 1 EM Clopidogrel '75mg'$  1 B Vitamin B12 1041mg 1 B Trelegy Inhaler 1068m 1 puff once daily  Metoprolol Tar '50mg'$  1 B and 1 EM Mens Multi 1 at  EM Fish Oil '1000mg'$  1 B and 1 EM Pantoprazole '40mg'$  1 B and 1 EM Rosuvastatin '40mg'$  1 EM Sodium Chloride- Inject 5 mLs into the vein every 12 (twelve) hours. Flush abdominal drain with 5 mL normal saline every 12 hours Potassium Chloride 1mq- 1 in B  Patient declined (meds) last month Repatha '140mg'$ - Pt is not on this  Patient is due for next adherence delivery on: 09/27/21. Called patient and reviewed medications and coordinated delivery.  This delivery to include: Eliquis '5mg'$  1 at B and 1  EM Clopidogrel '75mg'$  1 B Vitamin [id number] 1 B Trelegy Inhaler 10026m1 puff once daily  Metoprolol Tar '50mg'$  1 B and 1 EM Mens Multi 1 at EM Fish Oil '1000mg'$  1 B and 1 EM Pantoprazole '40mg'$  1 B and 1 EM Rosuvastatin '40mg'$  1 EM Lisinopril '20mg'$  1 at B Potassium Chloride 53m47m1 in B  Patient declined the following medications Repatha '140mg'$ - Pt reported not on this Albuterol Inhaler- Does not need   Patient needs refills -Request Sent  Clopidogrel '75mg'$   Vitamin B12 1000mc46metoprolol Tar '50mg'$   Pantoprazole '40mg'$   Rosuvastatin '40mg'$  Potassium Chloride 53meq30mnfirmed delivery date of 09/27/21, advised patient that pharmacy will contact them the morning of delivery.   DanielElray McgregorClHectoracist Assistant  [phone]

## 2021-09-16 ENCOUNTER — Other Ambulatory Visit: Payer: Self-pay

## 2021-09-16 ENCOUNTER — Telehealth: Payer: Self-pay

## 2021-09-16 DIAGNOSIS — K8689 Other specified diseases of pancreas: Secondary | ICD-10-CM | POA: Diagnosis not present

## 2021-09-16 DIAGNOSIS — K859 Acute pancreatitis without necrosis or infection, unspecified: Secondary | ICD-10-CM | POA: Diagnosis not present

## 2021-09-16 DIAGNOSIS — R188 Other ascites: Secondary | ICD-10-CM | POA: Diagnosis not present

## 2021-09-16 DIAGNOSIS — K9189 Other postprocedural complications and disorders of digestive system: Secondary | ICD-10-CM | POA: Diagnosis not present

## 2021-09-16 NOTE — Telephone Encounter (Signed)
Letter sent to PCP and cardiology to hold plavix and eliquis

## 2021-09-16 NOTE — Telephone Encounter (Signed)
He will need to. I would send to the cardiology group in [place] (in December 2022 there is a cardiology outpatient note) for Plavix hold.  Typical 5-day hold for Plavix. I would send the primary care provider a request for Eliquis hold for history of DVT/PE (is not clear to me that hematology is prescribing this for him).  Okay for 1 to 2-day Eliquis hold. Thanks. GM

## 2021-09-16 NOTE — Telephone Encounter (Signed)
-----   Message from Irving Copas., MD sent at 09/16/2021 12:15 PM EDT ----- Regarding: RE: MRCP SA, Interesting case started at he has had such significant issues with his pancreatitis as well. I can offer him an attempt at pancreatic ERCP though certainly as with many of our mutual patients, with him having developed post ERCP pancreatitis and having as much masses he has had he will be at risk of that again.  If you feel that since this has developed twice with the drain having been removed once again if there is going to be just as much concern if you remove it again.  If the patient is up for it we can try and get him set up for August 14 and I am willing to attempt another ERCP.  Let me know which decide I am going to have my nurse hold that time slot because I had a cancellation. Thanks. GM  Gregroy Dombkowski, Please hold August 14 for now and do not add anyone until we hear back from Dr. Zenia Resides as to whether she would like an attempt at ERCP. Thanks. GM ----- Message ----- From: Dwan Bolt, MD Sent: 09/15/2021   4:41 PM EDT To: Irving Copas., MD Subject: MRCP                                           GM, I just wanted your input on another pancreatitis patient, we discussed him at conference about a month ago. He had post-ERCP pancreatitis last December, and developed infected peripancreatic collections that were percutaneously drained. He has overall recovered but has had a recurrence of a RLQ fluid collection that communicates with a small collection near the head of the pancreas. This is the second recurrence of this collection. Previous drain amylase was low at 63. Our plan at [facility] was to get an MRCP to assess his ductal anatomy. I don't see any obvious duct abnormality on the MRI but there is still that small collection near the head of the pancreas. I am seeing him in clinic tomorrow. If his drain output is very low (it was a few weeks ago) I could remove it and see what happens,  but my worry is that this collection will recur again if he has a pancreatic fistula. I just wanted to see what you think about doing an ERCP at some point. Would you rather pull the drain again and see if this has spontaneously resolved? I know there would be some concern about doing another ERCP since that was the cause of pancreatitis in the first place. Thanks, [place]

## 2021-09-16 NOTE — Telephone Encounter (Signed)
There has been a hold placed on 8/14 at 12 noon in case this pt is needed

## 2021-09-16 NOTE — Telephone Encounter (Signed)
He is on Eliquis and Plavix.  Can he hold both?   The pt has been scheduled for 10/24/21 at 12 noon at [facility] with GM.  Left message on machine to call back      Mansouraty, Telford Nab., MD  Dwan Bolt, MD; Timothy Lasso, RN SA,  Thanks for the update.  Alec Jaros, please schedule this patient for ERCP pancreatic stenting as outlined below.  Please let Dr. Zenia Resides and I know if he agrees to this plan of action.  Thanks.  GM        Previous Messages    ----- Message -----  From: Dwan Bolt, MD  Sent: 09/16/2021   1:49 PM EDT  To: Timothy Lasso, RN; Irving Copas., MD  Subject: RE: MRCP                                       Thanks for your input. I just saw him, drain output is 10-15 ml/day but looks milky. We've tried pulling the drain before with low output and he reaccumulated the collection, so I think it's likely it would reaccumulate if we removed the drain again. That would be great if you could attempt an ERCP next month, and I discussed it with the patient.  Thanks,  [place]

## 2021-09-19 ENCOUNTER — Telehealth: Payer: Self-pay | Admitting: Gastroenterology

## 2021-09-19 ENCOUNTER — Other Ambulatory Visit: Payer: Self-pay | Admitting: Family Medicine

## 2021-09-19 NOTE — Telephone Encounter (Signed)
Returned your phone call.  Please call back.

## 2021-09-19 NOTE — Telephone Encounter (Signed)
ERCP scheduled, pt instructed and medications reviewed.  Patient instructions mailed to home and sent to My Chart.  Patient to call with any questions or concerns.   Waiting for anti coag response

## 2021-09-19 NOTE — Telephone Encounter (Signed)
Left message on machine to call back  

## 2021-09-19 NOTE — Telephone Encounter (Signed)
Patient returned your call, please advise. 

## 2021-09-21 ENCOUNTER — Other Ambulatory Visit: Payer: Self-pay | Admitting: Family Medicine

## 2021-09-21 ENCOUNTER — Other Ambulatory Visit (HOSPITAL_COMMUNITY): Payer: Self-pay | Admitting: Interventional Radiology

## 2021-09-21 ENCOUNTER — Ambulatory Visit
Admission: RE | Admit: 2021-09-21 | Discharge: 2021-09-21 | Disposition: A | Discharge status: Home / Self Care | Payer: Medicare HMO | Source: Ambulatory Visit | Attending: Surgery | Admitting: Surgery

## 2021-09-21 DIAGNOSIS — Z4682 Encounter for fitting and adjustment of non-vascular catheter: Secondary | ICD-10-CM | POA: Diagnosis not present

## 2021-09-21 DIAGNOSIS — K651 Peritoneal abscess: Secondary | ICD-10-CM

## 2021-09-21 DIAGNOSIS — L0291 Cutaneous abscess, unspecified: Secondary | ICD-10-CM

## 2021-09-21 HISTORY — PX: IR RADIOLOGIST EVAL & MGMT: IMG5224

## 2021-09-21 NOTE — Progress Notes (Signed)
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 Last drain injection on 08/23/21 demonstrated persistent communication to peripancreatic fluid collection, though trace.  He continues to have sanguinopurulent output, approximately 20 mL per day.  No fevers, chills, abdominal pain.  Flushes daily.    Past Medical History:  Diagnosis Date   Acquired thrombophilia ([place]) 12/06/2020   Acute pancreatitis after endoscopic retrograde cholangiopancreatography (ERCP) 03/03/2021   ERCP by Dr Odie Sera in [facility] 03/03/2021   AKI (acute kidney injury) ([place]) 04/13/2021   Chest pain 02/09/2021   Chronic obstructive pulmonary disease ([place]) 07/16/2018   Chronic respiratory failure with hypoxia ([place]) 04/13/2021   Colon polyp 10/25/2020   serrated adenoma.   Coronary artery disease due to lipid rich plaque 01/01/2021   Coronary artery disease with stable angina pectoris [facility])    AMI No heart cath, treated medically [facility]   Diverticulosis of colon 10/25/2020   Noted on colnoscopy by Dr Vicente Males in [place].   DNR (do not resuscitate) 03/03/2019   Hyperlipemia    Hypertension    Hypertensive heart disease without congestive heart failure 12/06/2020   Morbid obesity ([place]) 12/06/2020   BMI 35 with serious comorbidities including CAD, HTN, STROKE, Hyperlipidemia.   Necrotizing pancreatitis 04/13/2021   Need for vaccination 12/15/2019   Pulmonary embolus ([place]) 03/03/2019   Pulmonary nodules/lesions, multiple 11/25/2012   9/15/2014CT Chest : small nodule RUL LUL. Stable since 03/2012 Smoking cessation advised and Rx chantix Repeat CT Chest 4 /21/15: subpleural lymph nodes, likely benign repeat one year 2016>>>no change, considered benign Arlyce Harman 11/25/2012  NORMAL   Sepsis ([place]) 04/13/2021    Sequela, post-stroke 07/28/2019   Tobacco use disorder 11/25/2012    Past Surgical History:  Procedure Laterality Date   COLONOSCOPY WITH PROPOFOL N/A 10/25/2020   Jonathon Bellows, MD, at [facility]. 25 MM serrated adenoma polyp at ascending colon removed and site clipped.  Pan diverticulosis.   IR RADIOLOGIST EVAL & MGMT  05/25/2021   IR RADIOLOGIST EVAL & MGMT  08/09/2021   IR RADIOLOGIST EVAL & MGMT  08/23/2021   IR RADIOLOGIST EVAL & MGMT  09/21/2021   LAPAROSCOPIC CHOLECYSTECTOMY  02/2021   PERICARDIOCENTESIS N/A 05/01/2021   Procedure: PERICARDIOCENTESIS;  Surgeon: Martinique, Peter M, MD;  Location: [place] [facility];  Service: Cardiovascular;  Laterality: N/A;   TONSILLECTOMY      Allergies: Penicillin g  Medications: Prior to Admission medications   Medication Sig Start Date End Date Taking? Authorizing Provider  albuterol (VENTOLIN HFA) 108 (90 Base) MCG/ACT inhaler Inhale 2 puffs into the lungs every 6 (six) hours as needed for wheezing or shortness of breath. 04/04/21   Rip Harbour, NP  apixaban (ELIQUIS) 5 MG TABS tablet TAKE ONE TABLET BY MOUTH EVERY MORNING and TAKE ONE TABLET BY MOUTH EVERY EVENING 07/26/21   [name], Elnita Maxwell, MD  clopidogrel (PLAVIX) 75 MG tablet Take 1 tablet (75 mg total) by mouth daily. 09/15/21   [name], [name], MD  Evolocumab (REPATHA) 140 MG/ML SOSY Inject 140 mg into the skin every 14 (fourteen) days. Patient not taking: Reported on 09/19/2021 05/19/21   CoxElnita Maxwell, MD  Fluticasone-Umeclidin-Vilant (TRELEGY ELLIPTA) 100-62.5-25 MCG/ACT AEPB Inhale 1 puff into the lungs daily. 05/19/21   [name], Elnita Maxwell, MD  lisinopril (ZESTRIL) 20 MG tablet Take 1 tablet (20  mg total) by mouth daily. Patient taking differently: Take 40 mg by mouth daily. 08/01/21   CoxElnita Maxwell, MD  metoprolol tartrate (LOPRESSOR) 50 MG tablet Take 1 tablet (50 mg total) by mouth 2 (two) times daily. 09/15/21   CoxElnita Maxwell, MD  Multiple Vitamin (MULTIVITAMIN WITH MINERALS) TABS tablet Take 1 tablet by  mouth daily. 05/11/21   Hosie Poisson, MD  Omega-3 Fatty Acids (FISH OIL) 1000 MG CAPS Take 1 capsule (1,000 mg total) by mouth 2 (two) times daily. 11/30/20   [name], Elnita Maxwell, MD  ondansetron (ZOFRAN) 4 MG tablet Take 1 tablet (4 mg total) by mouth every 8 (eight) hours as needed for nausea or vomiting. 08/01/21   [name], [name], MD  pantoprazole (PROTONIX) 40 MG tablet TAKE ONE TABLET BY MOUTH EVERY MORNING and TAKE ONE TABLET BY MOUTH EVERY EVENING Strength: 40 mg 09/15/21   [name], [name], MD  potassium chloride SA (KLOR-CON M) 20 MEQ tablet Take 1 tablet (20 mEq total) by mouth daily. 09/15/21   [name], Elnita Maxwell, MD  rosuvastatin (CRESTOR) 40 MG tablet Take 1 tablet (40 mg total) by mouth at bedtime. 09/15/21   [name], [name], MD  sodium chloride 0.9 % injection Inject 5 mLs into the vein every 12 (twelve) hours. Flush abdominal drain with 5 mL normal saline every 12 hours Patient taking differently: Inject 3 mLs into the vein every 12 (twelve) hours. Flush abdominal drain with 5 mL normal saline every 12 hours 07/23/21 10/21/21  [place] Donnamarie Poag, DO  Sodium Chloride Flush (NORMAL SALINE FLUSH) 0.9 % SOLN Use As directed 08/31/21   Markus Daft, MD  vitamin B-12 (CYANOCOBALAMIN) 1000 MCG tablet Take 1 tablet (1,000 mcg total) by mouth every morning. 09/15/21   CoxElnita Maxwell, MD     Family History  Problem Relation Age of Onset   Hypertension Father    Heart attack Father    Stroke Father    Prostate cancer Father    Hypertension Sister    Hypertension Brother    Heart attack Brother    Lung cancer Sister     Social History   Socioeconomic History   Marital status: Married    Spouse name: Not on file   Number of children: Not on file   Years of education: Not on file   Highest education level: Not on file  Occupational History   Not on file  Tobacco Use   Smoking status: Former    Packs/day: 0.75    Years: 30.00    Total pack years: 22.50    Types: Cigarettes    Start date: 03/14/1979    Quit date:  11/2020    Years since quitting: 0.8   Smokeless tobacco: Never  Vaping Use   Vaping Use: Former   Start date: 07/28/2018  Substance and Sexual Activity   Alcohol use: No   Drug use: No   Sexual activity: Yes    Partners: Female  Other Topics Concern   Not on file  Social History Narrative   Not on file   Social Determinants of Health   Financial Resource Strain: Medium Risk (07/27/2021)   Overall Financial Resource Strain (CARDIA)    Difficulty of Paying Living Expenses: Somewhat hard  Food Insecurity: Not on file  Transportation Needs: No Transportation Needs (07/27/2021)   PRAPARE - Hydrologist (Medical): No    Lack of Transportation (Non-Medical): No  Physical Activity: Not on file  Stress: Not on file  Social Connections: Not  on file     Vital Signs: BP (!) 167/90   Pulse (!) 55   Temp (!) 97.5 F (36.4 C)   SpO2 96%   Physical Exam Constitutional:      General: He is not in acute distress. HENT:     Head: Normocephalic.     Mouth/Throat:     Mouth: Mucous membranes are moist.  Cardiovascular:     Rate and Rhythm: Normal rate and regular rhythm.     Heart sounds: Normal heart sounds.  Pulmonary:     Breath sounds: Normal breath sounds.  Abdominal:     General: There is no distension.     Comments: RLQ drain in place, opaque pink fluid in bag.  Musculoskeletal:     Right lower leg: No edema.     Left lower leg: No edema.  Skin:    General: Skin is warm and dry.     Coloration: Skin is not jaundiced.  Neurological:     Mental Status: He is alert and oriented to person, place, and time.     Imaging: IR Radiologist Eval & Mgmt  Result Date: 09/21/2021 Please refer to notes tab for details about interventional procedure. (Op Note)   Labs:  CBC: Recent Labs    07/21/21 0601 07/22/21 0152 07/23/21 0154 08/01/21 1128  WBC 9.4 9.2 9.5 9.2  HGB 10.3* 9.5* 9.9* 11.7*  HCT 31.5* 30.8* 31.2* 37.6  PLT 319 341 319 403     COAGS: Recent Labs    04/15/21 0153 04/15/21 0909 04/15/21 2008 04/16/21 0649 04/17/21 0341 07/21/21 0601  INR 1.6*  --   --  1.4* 1.5* 1.2  APTT  --  62* 69* 74* 65*  --     BMP: Recent Labs    07/20/21 0145 07/21/21 0601 07/22/21 0152 07/23/21 0154 08/01/21 1128 08/24/21 1022  NA 134* 137 134* 133* 142 141  K 3.8 3.7 3.4* 3.5 3.8 4.5  CL 106 108 103 106 104 101  CO2 '23 23 24 '$ 20* 24 22  GLUCOSE 99 102* 114* 101* 73 88  BUN '15 19 19 19 11 24  '$ CALCIUM 8.6* 9.0 8.7* 8.7* 9.5 10.1  CREATININE 1.06 1.16 1.54* 0.98 1.11 1.13  GFRNONAA >60 >60 52* >60  --   --     LIVER FUNCTION TESTS: Recent Labs    07/20/21 0145 07/21/21 0601 08/01/21 1128 08/24/21 1022  BILITOT 0.4 0.4 <0.2 <0.2  AST 10* 12* 13 14  ALT '10 12 13 16  '$ ALKPHOS 62 65 78 80  PROT 6.3* 6.8 7.4 7.7  ALBUMIN 2.5* 2.4* 4.0 4.5    Assessment and Plan: 56 year old male with history of 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 Last drain injection on 08/23/21 demonstrated persistent communication to peripancreatic fluid collection, though trace. Continues to drain approximately 20 mL per day of opaque, pink fluid.  Upon injection of drain today under fluoro, contrast immediate refluxed along skin entry site, none passing into the sinus cavity, with associated efflux of purulent fluid similar to that in bag.  The suture has been removed/dissolved.  I suspect there is a small hole in the catheter or the catheter is partially retracted, though appears similar radiographically.  Catheter was placed back to bag drainage and bandage applied.  Arrangements were made for the patient to arrive at [facility] tomorrow am for first case drain exchange with local anesthesia only.    Electronically Signed: Rosanne Ashing  Chrystie Hagwood 09/21/2021, 11:19 AM   I spent a total of 25 Minutes in face to face in clinical consultation, greater than 50% of which was counseling/coordinating care  for RLQ abdominal drain care.

## 2021-09-22 ENCOUNTER — Other Ambulatory Visit: Payer: Self-pay

## 2021-09-22 ENCOUNTER — Ambulatory Visit (HOSPITAL_COMMUNITY)
Admission: RE | Admit: 2021-09-22 | Discharge: 2021-09-22 | Disposition: A | Discharge status: Home / Self Care | Payer: Medicare HMO | Source: Ambulatory Visit | Attending: Interventional Radiology | Admitting: Interventional Radiology

## 2021-09-22 DIAGNOSIS — T85698A Other mechanical complication of other specified internal prosthetic devices, implants and grafts, initial encounter: Secondary | ICD-10-CM | POA: Diagnosis not present

## 2021-09-22 DIAGNOSIS — Z4803 Encounter for change or removal of drains: Secondary | ICD-10-CM | POA: Insufficient documentation

## 2021-09-22 DIAGNOSIS — L0291 Cutaneous abscess, unspecified: Secondary | ICD-10-CM | POA: Insufficient documentation

## 2021-09-22 HISTORY — PX: IR CATHETER TUBE CHANGE: IMG717

## 2021-09-22 MED ORDER — LIDOCAINE-EPINEPHRINE 1 %-1:100000 IJ SOLN
INTRAMUSCULAR | Status: AC
  Filled 2021-09-22: qty 1

## 2021-09-22 MED ORDER — PANTOPRAZOLE SODIUM 40 MG PO TBEC: DELAYED_RELEASE_TABLET | ORAL | 1 refills | Status: DC

## 2021-09-22 MED ORDER — LIDOCAINE HCL 1 % IJ SOLN
INTRAMUSCULAR | Status: AC
  Administered 2021-09-22: 6 mL
  Filled 2021-09-22: qty 20

## 2021-09-22 MED ORDER — IOHEXOL 300 MG/ML  SOLN
50.0000 mL | Freq: Once | INTRAMUSCULAR | Status: AC | PRN
  Administered 2021-09-22: 10 mL

## 2021-09-22 NOTE — Procedures (Signed)
Interventional Radiology Procedure Note  Procedure: Drain exchange  Findings: Please refer to procedural dictation for full description. Persistent difficulty flushing indwelling drain.  Removed over wire and exchanged for similar drain.  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  Drain exchanged from multipurpose to [employer] so smaller pigtail portion will not abut cecum.  Complications: None immediate  Estimated Blood Loss: < 5 mL  Recommendations: Keep drain to bag. Discontinue flushing. Follow up in 2 weeks in [facility] for repeat CT and drain injection.   Ruthann Cancer, MD Pager: [phone]

## 2021-09-27 ENCOUNTER — Other Ambulatory Visit: Payer: Self-pay | Admitting: Surgery

## 2021-09-27 ENCOUNTER — Other Ambulatory Visit: Payer: Self-pay

## 2021-09-27 DIAGNOSIS — I2510 Atherosclerotic heart disease of native coronary artery without angina pectoris: Secondary | ICD-10-CM

## 2021-09-27 DIAGNOSIS — K651 Peritoneal abscess: Secondary | ICD-10-CM

## 2021-09-27 DIAGNOSIS — I119 Hypertensive heart disease without heart failure: Secondary | ICD-10-CM

## 2021-09-27 DIAGNOSIS — E782 Mixed hyperlipidemia: Secondary | ICD-10-CM

## 2021-09-27 MED ORDER — REPATHA 140 MG/ML ~~LOC~~ SOSY: 140.0000 mg | PREFILLED_SYRINGE | SUBCUTANEOUS | 0 refills | Status: DC

## 2021-09-27 NOTE — Telephone Encounter (Signed)
ERCP scheduled, pt instructed and medications reviewed.  Patient instructions mailed to home.  Patient to call with any questions or concerns.   Plavix and Eliquis hold has been given to the pt.  He will hold plavix 5 days and Eliquis 2 days prior to the procedure.

## 2021-09-27 NOTE — Telephone Encounter (Signed)
If [facility] needs to fill this then we need a script sent in. Please advise.  Thank you

## 2021-09-27 NOTE — Telephone Encounter (Signed)
PCP wants patient back on Rybelsus. Coordinated with team to set up

## 2021-09-27 NOTE — Telephone Encounter (Signed)
Plavix is concerned I do not have details of his coronary artery disease but if his stent is more than 57 year old then I would suggest to substitute the Plavix with a coated baby aspirin so that the patient has some protection and antiplatelet therapy remains uninterrupted.

## 2021-09-29 NOTE — Telephone Encounter (Signed)
Rybelsus was written in error.  Will ask PCP about REPATHA

## 2021-09-30 NOTE — Telephone Encounter (Signed)
Coordinated with PCP regarding days supply

## 2021-10-05 ENCOUNTER — Other Ambulatory Visit: Payer: Self-pay | Admitting: Family Medicine

## 2021-10-05 DIAGNOSIS — E782 Mixed hyperlipidemia: Secondary | ICD-10-CM

## 2021-10-05 DIAGNOSIS — I2510 Atherosclerotic heart disease of native coronary artery without angina pectoris: Secondary | ICD-10-CM

## 2021-10-05 DIAGNOSIS — I119 Hypertensive heart disease without heart failure: Secondary | ICD-10-CM

## 2021-10-05 MED ORDER — REPATHA 140 MG/ML ~~LOC~~ SOSY: 140.0000 mg | PREFILLED_SYRINGE | SUBCUTANEOUS | 0 refills | Status: DC

## 2021-10-07 ENCOUNTER — Ambulatory Visit
Admission: RE | Admit: 2021-10-07 | Discharge: 2021-10-07 | Disposition: A | Discharge status: Home / Self Care | Payer: Medicare HMO | Source: Ambulatory Visit | Attending: Surgery | Admitting: Surgery

## 2021-10-07 ENCOUNTER — Other Ambulatory Visit (HOSPITAL_COMMUNITY): Payer: Self-pay | Admitting: Diagnostic Radiology

## 2021-10-07 DIAGNOSIS — I7409 Other arterial embolism and thrombosis of abdominal aorta: Secondary | ICD-10-CM | POA: Diagnosis not present

## 2021-10-07 DIAGNOSIS — K7689 Other specified diseases of liver: Secondary | ICD-10-CM | POA: Diagnosis not present

## 2021-10-07 DIAGNOSIS — K651 Peritoneal abscess: Secondary | ICD-10-CM

## 2021-10-07 DIAGNOSIS — K5790 Diverticulosis of intestine, part unspecified, without perforation or abscess without bleeding: Secondary | ICD-10-CM | POA: Diagnosis not present

## 2021-10-07 DIAGNOSIS — I714 Abdominal aortic aneurysm, without rupture, unspecified: Secondary | ICD-10-CM | POA: Diagnosis not present

## 2021-10-07 DIAGNOSIS — L0291 Cutaneous abscess, unspecified: Secondary | ICD-10-CM

## 2021-10-07 DIAGNOSIS — Z4682 Encounter for fitting and adjustment of non-vascular catheter: Secondary | ICD-10-CM | POA: Diagnosis not present

## 2021-10-07 HISTORY — PX: IR RADIOLOGIST EVAL & MGMT: IMG5224

## 2021-10-07 MED ORDER — IOPAMIDOL (ISOVUE-300) INJECTION 61%
100.0000 mL | Freq: Once | INTRAVENOUS | Status: AC | PRN
  Administered 2021-10-07: 100 mL via INTRAVENOUS

## 2021-10-07 NOTE — Progress Notes (Signed)
Chief Complaint: Patient was seen in consultation today for drain follow-up.  at the request of Allen,Shelby L  Referring Physician(s): Allen,Shelby L  History of Present Illness: Scott Foley is a 56 y.o. male with history of pancreatitis and recurrent abscess in the right lower quadrant of the abdomen.  Patient currently has a drain in the right lower quadrant and reports almost no output from the drain recently.  Drain exchange and injection on 09/22/2021 demonstrated a fistula tract to the right colon.  History of a sinus tract between the right lower quadrant abscess and small pancreatic/duodenal fluid collections.  Patient reports a mild fever 2 days ago that resolved with Tylenol.  No other fevers or chills.  He does not complain of abdominal pain.  Past Medical History:  Diagnosis Date   Acquired thrombophilia ([place]) 12/06/2020   Acute pancreatitis after endoscopic retrograde cholangiopancreatography (ERCP) 03/03/2021   ERCP by Dr Odie Sera in [facility] 03/03/2021   AKI (acute kidney injury) ([place]) 04/13/2021   Chest pain 02/09/2021   Chronic obstructive pulmonary disease ([place]) 07/16/2018   Chronic respiratory failure with hypoxia ([place]) 04/13/2021   Colon polyp 10/25/2020   serrated adenoma.   Coronary artery disease due to lipid rich plaque 01/01/2021   Coronary artery disease with stable angina pectoris [facility])    AMI No heart cath, treated medically [facility] [facility]   Diverticulosis of colon 10/25/2020   Noted on colnoscopy by Dr Vicente Males in [place].   DNR (do not resuscitate) 03/03/2019   Hyperlipemia    Hypertension    Hypertensive heart disease without congestive heart failure 12/06/2020   Morbid obesity ([place]) 12/06/2020   BMI 35 with serious comorbidities including CAD, HTN, STROKE, Hyperlipidemia.   Necrotizing pancreatitis 04/13/2021   Need for vaccination 12/15/2019   Pulmonary embolus ([place]) 03/03/2019   Pulmonary nodules/lesions, multiple 11/25/2012   9/15/2014CT  Chest : small nodule RUL LUL. Stable since 03/2012 Smoking cessation advised and Rx chantix Repeat CT Chest 4 /21/15: subpleural lymph nodes, likely benign repeat one year 2016>>>no change, considered benign Arlyce Harman 11/25/2012  NORMAL   Sepsis ([place]) 04/13/2021   Sequela, post-stroke 07/28/2019   Tobacco use disorder 11/25/2012    Past Surgical History:  Procedure Laterality Date   COLONOSCOPY WITH PROPOFOL N/A 10/25/2020   Jonathon Bellows, MD, at [facility]. 25 MM serrated adenoma polyp at ascending colon removed and site clipped.  Pan diverticulosis.   IR CATHETER TUBE CHANGE  09/22/2021   IR RADIOLOGIST EVAL & MGMT  05/25/2021   IR RADIOLOGIST EVAL & MGMT  08/09/2021   IR RADIOLOGIST EVAL & MGMT  08/23/2021   IR RADIOLOGIST EVAL & MGMT  09/21/2021   IR RADIOLOGIST EVAL & MGMT  10/07/2021   LAPAROSCOPIC CHOLECYSTECTOMY  02/2021   PERICARDIOCENTESIS N/A 05/01/2021   Procedure: PERICARDIOCENTESIS;  Surgeon: Martinique, Peter M, MD;  Location: [place] [facility];  Service: Cardiovascular;  Laterality: N/A;   TONSILLECTOMY      Allergies: Penicillin g  Medications: Prior to Admission medications   Medication Sig Start Date End Date Taking? Authorizing Provider  albuterol (VENTOLIN HFA) 108 (90 Base) MCG/ACT inhaler Inhale 2 puffs into the lungs every 6 (six) hours as needed for wheezing or shortness of breath. 04/04/21   Rip Harbour, NP  apixaban (ELIQUIS) 5 MG TABS tablet TAKE ONE TABLET BY MOUTH EVERY MORNING and TAKE ONE TABLET BY MOUTH EVERY EVENING 07/26/21   [name], Elnita Maxwell, MD  clopidogrel (PLAVIX) 75 MG tablet Take 1 tablet (75 mg total)  by mouth daily. 09/15/21   [name], [name], MD  Evolocumab (REPATHA) 140 MG/ML SOSY Inject 140 mg into the skin every 14 (fourteen) days. 10/05/21   [name], Elnita Maxwell, MD  Fluticasone-Umeclidin-Vilant (TRELEGY ELLIPTA) 100-62.5-25 MCG/ACT AEPB Inhale 1 puff into the lungs daily. 05/19/21   [name], Elnita Maxwell, MD  lisinopril (ZESTRIL) 20 MG tablet Take 1 tablet (20 mg total) by mouth  daily. Patient taking differently: Take 40 mg by mouth daily. 08/01/21   CoxElnita Maxwell, MD  metoprolol tartrate (LOPRESSOR) 50 MG tablet Take 1 tablet (50 mg total) by mouth 2 (two) times daily. 09/15/21   CoxElnita Maxwell, MD  Multiple Vitamin (MULTIVITAMIN WITH MINERALS) TABS tablet Take 1 tablet by mouth daily. 05/11/21   Hosie Poisson, MD  Omega-3 Fatty Acids (FISH OIL) 1000 MG CAPS Take 1 capsule (1,000 mg total) by mouth 2 (two) times daily. 11/30/20   [name], Elnita Maxwell, MD  ondansetron (ZOFRAN) 4 MG tablet Take 1 tablet (4 mg total) by mouth every 8 (eight) hours as needed for nausea or vomiting. 08/01/21   [name], [name], MD  pantoprazole (PROTONIX) 40 MG tablet TAKE ONE TABLET BY MOUTH EVERY MORNING and TAKE ONE TABLET BY MOUTH EVERY EVENING Strength: 40 mg 09/22/21   [name], [name], MD  potassium chloride SA (KLOR-CON M) 20 MEQ tablet Take 1 tablet (20 mEq total) by mouth daily. 09/15/21   [name], Elnita Maxwell, MD  rosuvastatin (CRESTOR) 40 MG tablet Take 1 tablet (40 mg total) by mouth at bedtime. 09/15/21   [name], [name], MD  sodium chloride 0.9 % injection Inject 5 mLs into the vein every 12 (twelve) hours. Flush abdominal drain with 5 mL normal saline every 12 hours Patient taking differently: Inject 3 mLs into the vein every 12 (twelve) hours. Flush abdominal drain with 5 mL normal saline every 12 hours 07/23/21 10/21/21  [place] Donnamarie Poag, DO  Sodium Chloride Flush (NORMAL SALINE FLUSH) 0.9 % SOLN Use As directed 08/31/21   Markus Daft, MD  vitamin B-12 (CYANOCOBALAMIN) 1000 MCG tablet Take 1 tablet (1,000 mcg total) by mouth every morning. 09/15/21   CoxElnita Maxwell, MD     Family History  Problem Relation Age of Onset   Hypertension Father    Heart attack Father    Stroke Father    Prostate cancer Father    Hypertension Sister    Hypertension Brother    Heart attack Brother    Lung cancer Sister     Social History   Socioeconomic History   Marital status: Married    Spouse name: Not on file   Number of children:  Not on file   Years of education: Not on file   Highest education level: Not on file  Occupational History   Not on file  Tobacco Use   Smoking status: Former    Packs/day: 0.75    Years: 30.00    Total pack years: 22.50    Types: Cigarettes    Start date: 03/14/1979    Quit date: 11/2020    Years since quitting: 0.9   Smokeless tobacco: Never  Vaping Use   Vaping Use: Former   Start date: 07/28/2018  Substance and Sexual Activity   Alcohol use: No   Drug use: No   Sexual activity: Yes    Partners: Female  Other Topics Concern   Not on file  Social History Narrative   Not on file   Social Determinants of Health   Financial Resource Strain: Medium Risk (07/27/2021)   Overall Financial Resource  Strain (CARDIA)    Difficulty of Paying Living Expenses: Somewhat hard  Food Insecurity: Not on file  Transportation Needs: No Transportation Needs (07/27/2021)   PRAPARE - Hydrologist (Medical): No    Lack of Transportation (Non-Medical): No  Physical Activity: Not on file  Stress: Not on file  Social Connections: Not on file    Review of Systems  Constitutional:  Positive for fever.  Gastrointestinal: Negative.     Vital Signs: BP (!) 152/98   Pulse 67   Temp 97.8 F (36.6 C)   SpO2 100%     Physical Exam Constitutional:      Appearance: Normal appearance. He is not ill-appearing.  Abdominal:     General: There is no distension.     Tenderness: There is no abdominal tenderness.     Comments: Right lower quadrant drain is intact.  No significant fluid in the drainage bag.  Retention suture and StatLock are still intact.  No significant redness or drainage around the drain.  Neurological:     General: No focal deficit present.     Mental Status: He is oriented to person, place, and time.  Psychiatric:        Mood and Affect: Mood normal.        Behavior: Behavior normal.        Imaging: CT ABDOMEN PELVIS W CONTRAST  Result  Date: 10/07/2021 CLINICAL DATA:  56 year old with history of pancreatitis and recurrent right lower quadrant abscess. Patient still has a drainage catheter in the right lower quadrant but reports minimal output. EXAM: CT ABDOMEN AND PELVIS WITH CONTRAST TECHNIQUE: Multidetector CT imaging of the abdomen and pelvis was performed using the standard protocol following bolus administration of intravenous contrast. RADIATION DOSE REDUCTION: This exam was performed according to the departmental dose-optimization program which includes automated exposure control, adjustment of the mA and/or kV according to patient size and/or use of iterative reconstruction technique. CONTRAST:  146m ISOVUE-300 IOPAMIDOL (ISOVUE-300) INJECTION 61% COMPARISON:  CT 08/23/2021 and MRI 09/01/2020 and chest CT 03/03/2019 FINDINGS: Lower chest: Stable 7 mm nodule in the right lower lobe on sequence 4, image 1. Small nodular densities in the anterior left upper lobe on sequence 4, image 3 appear to be unchanged since 2020. Volume loss in bilateral lower lobes, left side greater than right. Trace left pleural fluid. Coronary artery calcifications. Hepatobiliary: Cholecystectomy. Main portal venous system is patent. Small hypodensities in the liver are suggestive for small hepatic cysts. Small amount of pneumobilia. Pancreas: Small collection along the anterior aspect of the pancreatic head on sequence 2 image 35 appears to be collapsed without any significant fluid. This area is poorly defined. There continues to be a small sinus track inferior to the pancreatic head and adjacent to the duodenum on sequence 2 image 43. There is trace fluid remaining within this collection that roughly measures 1.7 x 0.9 cm and measured 2.3 x 1.1 cm on the most recent MRI. There is a small amount of tissue that extends from this small collection towards the right lower quadrant. The tissue that extends caudally towards the right lower quadrant is decompressed  without fluid and suggestive for a decompressed or healed sinus tract no pancreatic duct dilatation. No acute pancreatic inflammation. No new pancreatic fluid collections. Spleen: Normal in size with a focal calcification. Adrenals/Urinary Tract: Normal adrenal glands. Normal appearance of both kidneys. No hydronephrosis. No evidence for renal stones or suspicious renal lesions. Normal appearance of the urinary  bladder. Stomach/Bowel: Normal appearance of the stomach. Diverticula involving the right colon. Evidence for normal appendix. No acute bowel inflammation. Mild soft tissue stranding or traction between the small residual fluid collection around the duodenum and the right colon. Vascular/Lymphatic: Again noted is focal dilatation of the infrarenal abdominal aorta with an eccentric right saccular aneurysm containing a small amount of mural thrombus. Saccular aneurysm measures roughly 1.4 cm and minimally changed. Overall size of the abdominal aorta at the level of the saccular aneurysm is 3.3 cm which is stable. Diffuse atherosclerotic calcifications in the abdominal aorta. No lymph node enlargement in the abdomen or pelvis. Portal venous system remains patent including the splenic vein. Reproductive: Prostate is unremarkable. Other: No free fluid. Negative for free air. Right lower quadrant drain has minimally changed in position. The drain is along the anterior aspect of the right iliacus muscle and there is residual soft tissue or stranding in this area. No defined fluid collection around the drain. Musculoskeletal: No acute bone abnormality. IMPRESSION: 1. The small fluid collections centered around the pancreatic head and duodenum continue to decrease in size. Minimal residual fluid adjacent to the duodenum. 2. Right lower quadrant drain is in a stable position. No evidence for a fluid or abscess collection around the drain. The known sinus tract between the drain and the pancreatic/duodenal collection is  decompressed. A colonic fistula tract is not clearly identified. 3. Stable saccular aneurysm involving the right side of the abdominal aorta. Recommend vascular consultation. Reference: J Vasc Surg 7654;65:0-35. Electronically Signed   By: Markus Daft M.D.   On: 10/07/2021 14:56   DG Sinus/Fist Tube Chk-Non GI  Result Date: 10/07/2021 INDICATION: 56 year old with history of pancreatitis and right lower quadrant abscess. Previous imaging demonstrated a sinus tract between the right lower quadrant abscess and the pancreas/duodenum. Most recently, the drain was exchanged on 09/22/2021 and there was fistula tract to the right colon. Patient reports almost no output from the drain over the left past few days. EXAM: DRAIN INJECTION WITH FLUOROSCOPY MEDICATIONS: None ANESTHESIA/SEDATION: None COMPLICATIONS: None immediate. FLUOROSCOPY TIME:  Radiation Exposure Index (as provided by the fluoroscopic device): 4 mGy Kerma CONTRAST:  10 mL Omnipaque 300 PROCEDURE: Patient was placed supine on the fluoroscopic table. Drain was injected with contrast under fluoroscopy. Drain was flushed with saline at the end of the procedure and attached to a new gravity bag. FINDINGS: Drain is located in a stable position in the right lower quadrant of the abdomen. Contrast is preferentially draining around the tube and back to the skin. Incomplete contrast opacification of the drain pigtail. The previous sinus track is not opacified on this examination. IMPRESSION: 1. Contrast is preferentially draining backwards and around the drain. Concern that the pigtail portion of the drain is at least partially occluded. Therefore, it is not clear if the sinus track has completely healed or if there is just tube malfunction. 2. Patient will be scheduled for a drain exchange and repeat injection next week. I previously had correspondence with Dr. Grandville Silos and she was concerned about removing the drain because the patient has already had an abscess  recurrence in this area. I am also concerned about removing the drain since the most recent drain injection demonstrated a fistula to the right colon. Patient is reportedly scheduled for ERCP with pancreatic stenting on 10/24/2021. Electronically Signed   By: Markus Daft M.D.   On: 10/07/2021 14:26   IR Radiologist Eval & Mgmt  Result Date: 10/07/2021 Please refer to  notes tab for details about interventional procedure. (Op Note)  IR Catheter Tube Change  Result Date: 09/22/2021 CLINICAL DATA:  56 year old male with history of pancreatitis complicated by walled-off necrosis status post percutaneous drain placement on 04/18/2021 and again on 07/21/2021 for recurrent collection after drain removal. The patient presents for drain exchange 1 day after presenting to the drain clinic with malfunctioning drain unable to be injected. EXAM: IR CATHETER TUBE CHANGE COMPARISON:  09/21/2021, 08/23/2021 CONTRAST:  10 mL Omnipaque 300-administered via the percutaneous drainage catheter. MEDICATIONS: None. ANESTHESIA/SEDATION: None FLUOROSCOPY TIME:  Twenty-four mGy TECHNIQUE: Patient was positioned supine on the fluoroscopy table. The external portion of the existing percutaneous drainage catheter as well as the surrounding skin was prepped and draped in usual sterile fashion. A preprocedural spot fluoroscopic image was obtained of the existing percutaneous drainage catheter. A small amount of contrast was injected via the existing percutaneous drainage catheter and several fluoroscopic images were obtained. The external portion of the percutaneous drainage catheter was cut and cannulated with a short Amplatz wire. Under intermittent fluoroscopic guidance, the existing percutaneous drainage catheter was exchanged for a new 12 French Bettey Mare percutaneous drainage catheter with end coiled and locked within the decompressed abscess cavity. Contrast injection confirmed appropriate position functionality of the  percutaneous drainage catheter. The percutaneous drainage catheter was connected to a bag drainage and secured in place within interrupted suture and a StatLock device. A dressing was applied. The patient tolerated the procedure well without immediate postprocedural complication. FINDINGS: Persistent difficulty flushing the catheter, likely clogged in the pigtail portion. The catheter was exchanged over a wire for a 12 [place] multipurpose drainage catheter. Injection demonstrates direct visualization with the cecum. The catheter was then retracted and exchanged for a Walker. Completion drain injection demonstrates fistulous connection to the cecum. IMPRESSION: 1. Clotted multipurpose drainage catheter in the right lower quadrant. 2. Interval development of fistulous connection to the cecum. 3. Drain exchanged for a 12 [employer] placed to bag drainage. PLAN: 1. Discontinue flushing. 2. Keep to bag drainage and record output. 3. Return to drain clinic in 2 weeks for repeat CT and drain injection. Ruthann Cancer, MD [facility] Radiology Electronically Signed   By: Ruthann Cancer M.D.   On: 09/22/2021 09:43   DG Sinus/Fist Tube Chk-Non GI  Result Date: 09/21/2021 CLINICAL DATA:  56 year old male with history of pancreatitis complicated by walled-off necrosis status post percutaneous drain placement on 04/18/2021 and again on 07/21/2021 for recurrent collection after drain removal. The patient presents for drain injection to assess residual fluid collection and fistulous connection to peripancreatic fluid collection. The patient continues to drain approximately 20 mL of opaque, pink fluid per day. EXAM: ABSCESS INJECTION COMPARISON:  08/23/2021, 08/09/2021, 07/21/2021 CONTRAST:  2 mL Omnipaque 300-administered via the existing percutaneous drain. FLUOROSCOPY TIME:  3.01 mGy TECHNIQUE: The patient was positioned supine on the fluoroscopy  table. A preprocedural spot fluoroscopic image was obtained of the right lower quadrant and the existing percutaneous drainage catheter. Multiple spot fluoroscopic and radiographic images were obtained following the injection of a small amount of contrast via the existing percutaneous drainage catheter. FINDINGS: Upon injection of contrast, there was abrupt efflux of contrast around the skin entry site in addition to drainage of purulent fluid. There is no fluoroscopic evidence of contrast reaching the fluid collection. Upon inspection, there is no gross evidence of catheter fracture or kinking. IMPRESSION: Clogged versus fractured external portion of the catheter. No  ability to assess residual fluid collection. PLAN: The patient is scheduled to arrive for first case at [facility] tomorrow (09/22/2021) for fluoroscopic guided drain exchange and injection. Ruthann Cancer, MD [facility] Radiology Electronically Signed   By: Ruthann Cancer M.D.   On: 09/21/2021 12:57   IR Radiologist Eval & Mgmt  Result Date: 09/21/2021 Please refer to notes tab for details about interventional procedure. (Op Note)   Labs:  CBC: Recent Labs    07/21/21 0601 07/22/21 0152 07/23/21 0154 08/01/21 1128  WBC 9.4 9.2 9.5 9.2  HGB 10.3* 9.5* 9.9* 11.7*  HCT 31.5* 30.8* 31.2* 37.6  PLT 319 341 319 403    COAGS: Recent Labs    04/15/21 0153 04/15/21 0909 04/15/21 2008 04/16/21 0649 04/17/21 0341 07/21/21 0601  INR 1.6*  --   --  1.4* 1.5* 1.2  APTT  --  62* 69* 74* 65*  --     BMP: Recent Labs    07/20/21 0145 07/21/21 0601 07/22/21 0152 07/23/21 0154 08/01/21 1128 08/24/21 1022  NA 134* 137 134* 133* 142 141  K 3.8 3.7 3.4* 3.5 3.8 4.5  CL 106 108 103 106 104 101  CO2 '23 23 24 '$ 20* 24 22  GLUCOSE 99 102* 114* 101* 73 88  BUN '15 19 19 19 11 24  '$ CALCIUM 8.6* 9.0 8.7* 8.7* 9.5 10.1  CREATININE 1.06 1.16 1.54* 0.98 1.11 1.13  GFRNONAA >60 >60 52* >60   --   --     LIVER FUNCTION TESTS: Recent Labs    07/20/21 0145 07/21/21 0601 08/01/21 1128 08/24/21 1022  BILITOT 0.4 0.4 <0.2 <0.2  AST 10* 12* 13 14  ALT '10 12 13 16  '$ ALKPHOS 62 65 78 80  PROT 6.3* 6.8 7.4 7.7  ALBUMIN 2.5* 2.4* 4.0 4.5    TUMOR MARKERS: No results for input(s): "AFPTM", "CEA", "CA199", "CHROMGRNA" in the last 8760 hours.  Assessment and Plan:  56 year old with history of pancreatitis and recurrent right lower quadrant abscess.  Patient had a sinus tract between the right lower quadrant abscess and peripancreatic/duodenal fluid collections.  These fluid collections around the pancreas and duodenum continue to decrease in size and the sinus tract to the right lower quadrant appears to be decompressed and possibly healed.  There is no significant fluid around the right lower quadrant drain itself.  Today's drain injection demonstrated contrast preferentially going backwards and draining onto the skin.  It is possible that the cavity and sinus tract around the drain has healed but there is also concern that a portion of the drain is occluded and we are not opacifying the sinus tract.  As a result, we will schedule the patient for an outpatient drain exchange and repeat injection next week.  We are reluctant to remove this drain until we feel very confident that the sinus tracts are healed because the patient did have a recurrent abscess in the past.  Fortunately, the small fluid collections around the pancreas and duodenum continue to decrease in size and there is only trace residual fluid in these areas.  If the drain injection continues to show a sinus tract to the pancreas/duodenum, we will plan to keep the drain until the patient has seen GI and had the attempted pancreatic duct stenting.  Thank you for this interesting consult.  I greatly enjoyed meeting Scott Foley and look forward to participating in their care.  A copy of this report was sent to the  requesting provider on this date.  Electronically Signed: Burman Riis 10/07/2021, 3:19 PM   I spent a total of    10 Minutes in face to face in clinical consultation, greater than 50% of which was counseling/coordinating care for drain management.  Patient ID: Scott Foley, male   DOB: Nov 21, 1965, 56 y.o.   MRN: [id number]

## 2021-10-10 ENCOUNTER — Ambulatory Visit (HOSPITAL_COMMUNITY)
Admission: RE | Admit: 2021-10-10 | Discharge: 2021-10-10 | Disposition: A | Discharge status: Home / Self Care | Payer: Medicare HMO | Source: Ambulatory Visit | Attending: Diagnostic Radiology | Admitting: Diagnostic Radiology

## 2021-10-10 DIAGNOSIS — K8689 Other specified diseases of pancreas: Secondary | ICD-10-CM | POA: Diagnosis not present

## 2021-10-10 DIAGNOSIS — L0291 Cutaneous abscess, unspecified: Secondary | ICD-10-CM | POA: Insufficient documentation

## 2021-10-10 DIAGNOSIS — K9189 Other postprocedural complications and disorders of digestive system: Secondary | ICD-10-CM | POA: Diagnosis not present

## 2021-10-10 DIAGNOSIS — Z4682 Encounter for fitting and adjustment of non-vascular catheter: Secondary | ICD-10-CM | POA: Diagnosis not present

## 2021-10-10 DIAGNOSIS — Z4803 Encounter for change or removal of drains: Secondary | ICD-10-CM | POA: Insufficient documentation

## 2021-10-10 DIAGNOSIS — K859 Acute pancreatitis without necrosis or infection, unspecified: Secondary | ICD-10-CM | POA: Diagnosis not present

## 2021-10-10 HISTORY — PX: IR CATHETER TUBE CHANGE: IMG717

## 2021-10-10 MED ORDER — LIDOCAINE HCL 1 % IJ SOLN
INTRAMUSCULAR | Status: AC
  Filled 2021-10-10: qty 20

## 2021-10-10 MED ORDER — IOHEXOL 300 MG/ML  SOLN
100.0000 mL | Freq: Once | INTRAMUSCULAR | Status: AC | PRN
  Administered 2021-10-10: 15 mL

## 2021-10-10 MED ORDER — LIDOCAINE HCL (PF) 1 % IJ SOLN
INTRAMUSCULAR | Status: DC | PRN
  Administered 2021-10-10: 10 mL

## 2021-10-10 NOTE — Procedures (Signed)
Interventional Radiology Procedure Note  Procedure: FLUORO RLQ DRAIN [place] AND INJECTION    Complications: None  Estimated Blood Loss:  MIN  Findings: NEG FOR FISTULA  CONSIDER REMOVAL AFTER SEEN BY DR Zenia Resides THIS AFTERNOON    Tamera Punt, MD

## 2021-10-14 ENCOUNTER — Telehealth: Payer: Self-pay

## 2021-10-14 NOTE — Progress Notes (Signed)
Chronic Care Management Pharmacy Assistant   Name: Scott Foley  MRN: [id number] DOB: 1965-03-15   Reason for Encounter: Medication Coordination for Upstream    Recent office visits:  None  Recent consult visits:  10/10/21 [facility] surgery) Miguel Dibble MD. Seen for follow up. No med changes.  09/16/21 ([place] surgery) Miguel Dibble MD. Seen for follow up. No med changes.  Hospital visits:  None  Medications: Outpatient Encounter Medications as of 10/14/2021  Medication Sig   albuterol (VENTOLIN HFA) 108 (90 Base) MCG/ACT inhaler Inhale 2 puffs into the lungs every 6 (six) hours as needed for wheezing or shortness of breath.   apixaban (ELIQUIS) 5 MG TABS tablet TAKE ONE TABLET BY MOUTH EVERY MORNING and TAKE ONE TABLET BY MOUTH EVERY EVENING   clopidogrel (PLAVIX) 75 MG tablet Take 1 tablet (75 mg total) by mouth daily.   Evolocumab (REPATHA) 140 MG/ML SOSY Inject 140 mg into the skin every 14 (fourteen) days.   Fluticasone-Umeclidin-Vilant (TRELEGY ELLIPTA) 100-62.5-25 MCG/ACT AEPB Inhale 1 puff into the lungs daily.   lisinopril (ZESTRIL) 20 MG tablet Take 1 tablet (20 mg total) by mouth daily. (Patient taking differently: Take 40 mg by mouth daily.)   metoprolol tartrate (LOPRESSOR) 50 MG tablet Take 1 tablet (50 mg total) by mouth 2 (two) times daily.   Multiple Vitamin (MULTIVITAMIN WITH MINERALS) TABS tablet Take 1 tablet by mouth daily.   Omega-3 Fatty Acids (FISH OIL) 1000 MG CAPS Take 1 capsule (1,000 mg total) by mouth 2 (two) times daily.   ondansetron (ZOFRAN) 4 MG tablet Take 1 tablet (4 mg total) by mouth every 8 (eight) hours as needed for nausea or vomiting.   pantoprazole (PROTONIX) 40 MG tablet TAKE ONE TABLET BY MOUTH EVERY MORNING and TAKE ONE TABLET BY MOUTH EVERY EVENING Strength: 40 mg   potassium chloride SA (KLOR-CON M) 20 MEQ tablet Take 1 tablet (20 mEq total) by mouth daily.   rosuvastatin (CRESTOR) 40 MG tablet Take 1 tablet (40 mg  total) by mouth at bedtime.   sodium chloride 0.9 % injection Inject 5 mLs into the vein every 12 (twelve) hours. Flush abdominal drain with 5 mL normal saline every 12 hours (Patient taking differently: Inject 3 mLs into the vein every 12 (twelve) hours. Flush abdominal drain with 5 mL normal saline every 12 hours)   Sodium Chloride Flush (NORMAL SALINE FLUSH) 0.9 % SOLN Use As directed   vitamin B-12 (CYANOCOBALAMIN) 1000 MCG tablet Take 1 tablet (1,000 mcg total) by mouth every morning.   No facility-administered encounter medications on file as of 10/14/2021.    Reviewed chart for medication changes ahead of medication coordination call.  No OVs, or hospital visits since last care coordination call/Pharmacist visit. (  No medication changes indicated OR if recent visit, treatment plan here.  BP Readings from Last 3 Encounters:  10/07/21 (!) 152/98  09/21/21 (!) 167/90  09/12/21 122/80    No results found for: "HGBA1C"   Patient obtains medications through Adherence Packaging  30 Days   Last adherence delivery included:  Eliquis '5mg'$  1 at B and 1 EM Clopidogrel '75mg'$  1 B Vitamin B12 1029mg 1 B Trelegy Inhaler 1032m 1 puff once daily  Metoprolol Tar '50mg'$  1 B and 1 EM Mens Multi 1 at EM Fish Oil '1000mg'$  1 B and 1 EM Pantoprazole '40mg'$  1 B and 1 EM Rosuvastatin '40mg'$  1 EM Lisinopril '20mg'$  1 at B Potassium Chloride 2039m 1 in B  Patient  declined (meds) last month  Repatha '140mg'$ - Pt reported not on this Albuterol Inhaler- Does not need   Patient is due for next adherence delivery on: 10/26/21. Called patient and reviewed medications and coordinated delivery.  This delivery to include: Eliquis '5mg'$  1 at B and 1 EM Clopidogrel '75mg'$  1 B Vitamin B12 1021mg 1 B Trelegy Inhaler 10109m 1 puff once daily  Metoprolol Tar '50mg'$  1 B and 1 EM Mens Multi 1 at EM Fish Oil '1000mg'$  1 B and 1 EM Pantoprazole '40mg'$  1 B and 1 EM Rosuvastatin '40mg'$  1 EM Lisinopril '20mg'$  1 at B Potassium Chloride  2065m 1 in B Repatha Sureclick '140mg'$ - Inject '140mg'$  every 14 days  Albuterol 108m10mnhaler- Inhale 2 puffs into the lungs every 6 (six) hours as needed   Patient declined the following medications  None  Patient needs refills -Request Sent  Lisinopril '20mg'$  Albuterol 108mc58mhaler  Confirmed delivery date of 10/26/21, advised patient that pharmacy will contact them the morning of delivery.   DanieElray Mcgregor CLake Cavanaughmacist Assistant  [phone]

## 2021-10-17 ENCOUNTER — Encounter (HOSPITAL_COMMUNITY): Payer: Self-pay | Admitting: Gastroenterology

## 2021-10-17 ENCOUNTER — Other Ambulatory Visit: Payer: Self-pay

## 2021-10-17 DIAGNOSIS — Z9981 Dependence on supplemental oxygen: Secondary | ICD-10-CM

## 2021-10-17 DIAGNOSIS — J449 Chronic obstructive pulmonary disease, unspecified: Secondary | ICD-10-CM

## 2021-10-17 DIAGNOSIS — J9611 Chronic respiratory failure with hypoxia: Secondary | ICD-10-CM

## 2021-10-17 MED ORDER — LISINOPRIL 20 MG PO TABS: 20.0000 mg | ORAL_TABLET | Freq: Every day | ORAL | 0 refills | Status: DC

## 2021-10-17 MED ORDER — ALBUTEROL SULFATE HFA 108 (90 BASE) MCG/ACT IN AERS: 2.0000 | INHALATION_SPRAY | Freq: Four times a day (QID) | RESPIRATORY_TRACT | 0 refills | Status: DC | PRN

## 2021-10-17 NOTE — Telephone Encounter (Signed)
Compliant on meds 

## 2021-10-18 ENCOUNTER — Telehealth: Payer: Self-pay

## 2021-10-18 NOTE — Telephone Encounter (Signed)
-----   Message from Irving Copas., MD sent at 10/18/2021 12:49 PM EDT ----- Regarding: RE: ERCP SA, I am glad to hear that the patient is doing well. We will cancel the ERCP. Certainly please keep me up-to-date in the future.  Aleta Manternach, Please cancel this patient's ERCP. We should have an EUS or 2 that we can add next week (look for an upcoming note). Thanks. GM ----- Message ----- From: Dwan Bolt, MD Sent: 10/18/2021  11:44 AM EDT To: Irving Copas., MD Subject: RE: ERCP                                       GM, I saw this patient back today, he is doing well with essentially no drain output. I removed the drain and I don't think he needs an ERCP right now, can you please cancel? He was scheduled for next week on 8/14. I will see him back for follow up, if he reaccumulates another collection I think we can revisit PD stent placement in the future, but hopefully his fistula has closed spontaneously. Thanks, Wilburn Cornelia  ----- Message ----- From: Irving Copas., MD Sent: 10/10/2021   5:47 PM EDT To: Dwan Bolt, MD Subject: RE: ERCP                                       SA, No worries.  If things have spontaneously closed finally that will always be better than ice instrumenting or going after things again.  Let me know.  I have a few patients waiting who could take that spot, so not an issue.  Always best to procedure allies when needed, not when not needed. Thanks. GM ----- Message ----- From: Dwan Bolt, MD Sent: 10/10/2021   4:56 PM EDT To: Irving Copas., MD Subject: ERCP                                           Gabe, We have discussed this patient before, he has a history of severe post-ERCP pancreatitis and had a recurrent RLQ fluid collection, presumably from an ongoing PD leak. He is scheduled for an ERCP with you on 8/14. I saw him again today, and his CT from last weak shows near-resolution of the small collection near the head of  the pancreas. His drain was exchanged today and was occluded, but he was doing well even without a functional drain. Now that his drain is working, I will see him back next week. If he is still not having any drainage, and given the resolution of the previous pseudocyst on the CT, I am thinking maybe we can hold off on the ERCP after all. I'm sorry to do this as I know a lot of work goes into coordinating this, I just don't want him to get a procedure he doesn't really need, and I am hoping his leak has finally close spontaneously. Please let me know if you have any other thoughts or disagree. I can let you know how he is doing when I see him next week. Thanks, [place]

## 2021-10-18 NOTE — Telephone Encounter (Signed)
The appt has been cancelled per order

## 2021-10-24 ENCOUNTER — Ambulatory Visit (HOSPITAL_COMMUNITY): Admission: RE | Admit: 2021-10-24 | Payer: Medicare HMO | Source: Home / Self Care | Admitting: Gastroenterology

## 2021-10-24 SURGERY — ENDOSCOPIC RETROGRADE CHOLANGIOPANCREATOGRAPHY (ERCP) WITH PROPOFOL
Anesthesia: General

## 2021-11-10 ENCOUNTER — Other Ambulatory Visit: Payer: Self-pay

## 2021-11-11 ENCOUNTER — Telehealth: Payer: Self-pay

## 2021-11-11 NOTE — Progress Notes (Signed)
Chronic Care Management Pharmacy Assistant   Name: Mivaan Corbitt  MRN: [id number] DOB: 19-Dec-1965   Reason for Encounter: Medication Coordination for Upstream   Recent office visits:  None  Recent consult visits:  11/10/21 (Cardiology) Leighton Ruff CMA. Orders Only. D/C Repatha '140mg'$ .   Hospital visits:  None  Medications: Outpatient Encounter Medications as of 11/11/2021  Medication Sig   albuterol (VENTOLIN HFA) 108 (90 Base) MCG/ACT inhaler Inhale 2 puffs into the lungs every 6 (six) hours as needed for wheezing or shortness of breath.   apixaban (ELIQUIS) 5 MG TABS tablet TAKE ONE TABLET BY MOUTH EVERY MORNING and TAKE ONE TABLET BY MOUTH EVERY EVENING   clopidogrel (PLAVIX) 75 MG tablet Take 1 tablet (75 mg total) by mouth daily.   Fluticasone-Umeclidin-Vilant (TRELEGY ELLIPTA) 100-62.5-25 MCG/ACT AEPB Inhale 1 puff into the lungs daily.   lisinopril (ZESTRIL) 20 MG tablet Take 1 tablet (20 mg total) by mouth daily.   metoprolol tartrate (LOPRESSOR) 50 MG tablet Take 1 tablet (50 mg total) by mouth 2 (two) times daily.   Multiple Vitamin (MULTIVITAMIN WITH MINERALS) TABS tablet Take 1 tablet by mouth daily.   Omega-3 Fatty Acids (FISH OIL) 1000 MG CAPS Take 1 capsule (1,000 mg total) by mouth 2 (two) times daily.   ondansetron (ZOFRAN) 4 MG tablet Take 1 tablet (4 mg total) by mouth every 8 (eight) hours as needed for nausea or vomiting.   pantoprazole (PROTONIX) 40 MG tablet TAKE ONE TABLET BY MOUTH EVERY MORNING and TAKE ONE TABLET BY MOUTH EVERY EVENING Strength: 40 mg   potassium chloride SA (KLOR-CON M) 20 MEQ tablet Take 1 tablet (20 mEq total) by mouth daily.   REPATHA SURECLICK 109 MG/ML SOAJ Inject 140 mg into the skin every 14 (fourteen) days.   rosuvastatin (CRESTOR) 40 MG tablet Take 1 tablet (40 mg total) by mouth at bedtime.   Sodium Chloride Flush (NORMAL SALINE FLUSH) 0.9 % SOLN Use As directed   vitamin B-12 (CYANOCOBALAMIN) 1000 MCG tablet Take 1  tablet (1,000 mcg total) by mouth every morning.   No facility-administered encounter medications on file as of 11/11/2021.    Reviewed chart for medication changes ahead of medication coordination call.  No OVs, Consults, or hospital visits since last care coordination call/Pharmacist visit.   BP Readings from Last 3 Encounters:  10/07/21 (!) 152/98  09/21/21 (!) 167/90  09/12/21 122/80    No results found for: "HGBA1C"   Patient obtains medications through Adherence Packaging  30 Days   Last adherence delivery included:  Eliquis '5mg'$  1 at B and 1 EM Clopidogrel '75mg'$  1 B Vitamin B12 1025mg 1 B Trelegy Inhaler 105m 1 puff once daily  Metoprolol Tar '50mg'$  1 B and 1 EM Mens Multi 1 at EM Fish Oil '1000mg'$  1 B and 1 EM Pantoprazole '40mg'$  1 B and 1 EM Rosuvastatin '40mg'$  1 EM Lisinopril '20mg'$  1 at B Potassium Chloride 2019m 1 in B Repatha Sureclick '140mg'$ - Inject '140mg'$  every 14 days  Albuterol 108m75mnhaler- Inhale 2 puffs into the lungs every 6 (six) hours as needed   Patient declined (meds) last month  None  Patient is due for next adherence delivery on: 11/24/21. Called patient and reviewed medications and coordinated delivery.  This delivery to include: Eliquis '5mg'$  1 at B and 1 EM Clopidogrel '75mg'$  1 B Vitamin B12 1000mc55mB Trelegy Inhaler 100mcg81muff once daily  Metoprolol Tar '50mg'$  1 B and 1 EM Mens Multi 1 at  EM Fish Oil '1000mg'$  1 B and 1 EM Pantoprazole '40mg'$  1 B and 1 EM Rosuvastatin '40mg'$  1 EM Lisinopril '20mg'$  1 at B Potassium Chloride 64mq- 1 in B Repatha Sureclick '140mg'$ - Inject '140mg'$  every 14 days  Albuterol 1073m inhaler- Inhale 2 puffs into the lungs every 6 (six) hours as needed   Patient declined the following medications  None  Patient needs refills -Request Sent  Eliquis '5mg'$  Clopidogrel '75mg'$  Albuterol 10865minhaler   Confirmed delivery date of 11/24/21, advised patient that pharmacy will contact them the morning of delivery.   DanElray McgregorMABoltarmacist Assistant  [phone]

## 2021-11-13 ENCOUNTER — Other Ambulatory Visit: Payer: Self-pay | Admitting: Family Medicine

## 2021-11-15 ENCOUNTER — Ambulatory Visit: Payer: Medicare HMO | Attending: Cardiology | Admitting: Cardiology

## 2021-11-15 ENCOUNTER — Encounter: Payer: Self-pay | Admitting: Cardiology

## 2021-11-15 ENCOUNTER — Other Ambulatory Visit: Payer: Self-pay

## 2021-11-15 VITALS — BP 131/80 | HR 72 | Ht 64.0 in | Wt 194.2 lb

## 2021-11-15 DIAGNOSIS — I25118 Atherosclerotic heart disease of native coronary artery with other forms of angina pectoris: Secondary | ICD-10-CM

## 2021-11-15 DIAGNOSIS — Z8673 Personal history of transient ischemic attack (TIA), and cerebral infarction without residual deficits: Secondary | ICD-10-CM | POA: Diagnosis not present

## 2021-11-15 DIAGNOSIS — I2782 Chronic pulmonary embolism: Secondary | ICD-10-CM

## 2021-11-15 DIAGNOSIS — J9611 Chronic respiratory failure with hypoxia: Secondary | ICD-10-CM

## 2021-11-15 DIAGNOSIS — I10 Essential (primary) hypertension: Secondary | ICD-10-CM

## 2021-11-15 DIAGNOSIS — I3139 Other pericardial effusion (noninflammatory): Secondary | ICD-10-CM | POA: Diagnosis not present

## 2021-11-15 DIAGNOSIS — E782 Mixed hyperlipidemia: Secondary | ICD-10-CM | POA: Diagnosis not present

## 2021-11-15 DIAGNOSIS — Z9981 Dependence on supplemental oxygen: Secondary | ICD-10-CM

## 2021-11-15 DIAGNOSIS — J449 Chronic obstructive pulmonary disease, unspecified: Secondary | ICD-10-CM

## 2021-11-15 MED ORDER — APIXABAN 5 MG PO TABS: ORAL_TABLET | ORAL | 1 refills | Status: DC

## 2021-11-15 MED ORDER — CLOPIDOGREL BISULFATE 75 MG PO TABS: 75.0000 mg | ORAL_TABLET | Freq: Every day | ORAL | 1 refills | Status: DC

## 2021-11-15 MED ORDER — ALBUTEROL SULFATE HFA 108 (90 BASE) MCG/ACT IN AERS: 2.0000 | INHALATION_SPRAY | Freq: Four times a day (QID) | RESPIRATORY_TRACT | 2 refills | Status: DC | PRN

## 2021-11-15 NOTE — Patient Instructions (Signed)
Medication Instructions:  Your physician recommends that you continue on your current medications as directed. Please refer to the Current Medication list given to you today.  *If you need a refill on your cardiac medications before your next appointment, please call your pharmacy*   Lab Work: None If you have labs (blood work) drawn today and your tests are completely normal, you will receive your results only by: [place] (if you have MyChart) OR A paper copy in the mail If you have any lab test that is abnormal or we need to change your treatment, we will call you to review the results.   Testing/Procedures: Your physician has requested that you have an echocardiogram. Echocardiography is a painless test that uses sound waves to create images of your heart. It provides your doctor with information about the size and shape of your heart and how well your heart's chambers and valves are working. This procedure takes approximately one hour. There are no restrictions for this procedure.    Follow-Up: At [facility], you and your health needs are our priority.  As part of our continuing mission to provide you with exceptional heart care, we have created designated Provider Care Teams.  These Care Teams include your primary Cardiologist (physician) and Advanced Practice Providers (APPs -  Physician Assistants and Nurse Practitioners) who all work together to provide you with the care you need, when you need it.  We recommend signing up for the patient portal called "MyChart".  Sign up information is provided on this After Visit Summary.  MyChart is used to connect with patients for Virtual Visits (Telemedicine).  Patients are able to view lab/test results, encounter notes, upcoming appointments, etc.  Non-urgent messages can be sent to your provider as well.   To learn more about what you can do with MyChart, go to [email or web address].    Your next appointment:   9  month(s)  The format for your next appointment:   In Person  Provider:   Jyl Heinz, MD.  Other Instructions None  Important Information About Sugar

## 2021-11-15 NOTE — Progress Notes (Signed)
Cardiology Office Note:    Date:  11/15/2021   ID:  Scott Foley, DOB October 25, 1965, MRN [id number]  PCP:  [name], MD  Cardiologist:  Jenean Lindau, MD   Referring MD: [name], MD    ASSESSMENT:    1. Coronary artery disease of native artery of native heart with stable angina pectoris ([place])   2. Essential hypertension   3. Mixed hyperlipidemia   4. History of CVA (cerebrovascular accident)   5. Pericardial effusion    PLAN:    In order of problems listed above:  Coronary artery disease: Secondary prevention stressed with the patient.  Importance of compliance with diet and medication stressed and vocalized understanding.  He was advised to ambulate to the best of his ability. Essential hypertension: Blood pressure stable and diet was emphasized. Dyslipidemia: On lipid-lowering medications.  Lipids reviewed.  He is going to see his primary care in the next few days and promises to get blood work and send me a copy. History of pericardial effusion: We will do a follow-up echocardiogram to make sure things are stable at this time. History of u pulmonary embolism on anticoagulation.  This is followed by primary care. Patient will be seen in follow-up appointment in 6 months or earlier if the patient has any concerns    Medication Adjustments/Labs and Tests Ordered: Current medicines are reviewed at length with the patient today.  Concerns regarding medicines are outlined above.  No orders of the defined types were placed in this encounter.  No orders of the defined types were placed in this encounter.    No chief complaint on file.    History of Present Illness:    Scott Foley is a 56 y.o. male.  Patient has past medical history of coronary artery disease, essential hypertension, history of pancreatitis and pulmonary embolism and pericardial effusion.  He has history of mixed dyslipidemia.  He denies any problems at this time and takes care of  activities of daily living.  He has had serious issues with pancreatitis and gallbladder stones and he has had extended course of hospital admissions for this.  Subsequently his home.  He denies any chest pain orthopnea or PND.  He is trying to get his energy back.  At the time of my evaluation, the patient is alert awake oriented and in no distress.  Past Medical History:  Diagnosis Date   Acquired thrombophilia ([place]) 12/06/2020   Acute pancreatitis after endoscopic retrograde cholangiopancreatography (ERCP) 03/03/2021   ERCP by Dr Odie Sera in [place] 03/03/2021   B12 deficiency 07/04/2021   Chronic obstructive pulmonary disease ([place]) 07/16/2018   CKD (chronic kidney disease), stage II 07/04/2021   Colon polyp 10/25/2020   serrated adenoma.   Coronary artery disease due to lipid rich plaque 01/01/2021   Coronary artery disease with stable angina pectoris [facility])    AMI No heart cath, treated medically [facility]   Diverticulosis of colon 10/25/2020   Noted on colnoscopy by Dr Vicente Males in [place].   DNR (do not resuscitate) 03/03/2019   Encounter for hepatitis C screening test for low risk patient 07/04/2021   Essential hypertension 02/15/2021   History of blood clotting disorder 06/11/2021   History of CVA (cerebrovascular accident) 07/16/2018   Formatting of this note might be different from the original. CVA 3 years ago left with short term memory deficits. Has been on Plavix since.   History of pulmonary embolism 02/15/2021   Hyperlipemia    Hypertension  Hypertensive heart disease without congestive heart failure 12/06/2020   Hypoglycemia 04/15/2021   Hypokalemia 04/13/2021   Hypomagnesemia 06/11/2021   Intra-abdominal fluid collection 07/18/2021   Medication monitoring encounter 06/24/2021   Morbid obesity ([place]) 12/06/2020   BMI 35 with serious comorbidities including CAD, HTN, STROKE, Hyperlipidemia.   Normocytic anemia 04/15/2021   Pericardial effusion 04/29/2021   Pericardial tamponade     Protein-calorie malnutrition, severe 04/15/2021   Pulmonary nodules/lesions, multiple 11/25/2012   9/15/2014CT Chest : small nodule RUL LUL. Stable since 03/2012 Smoking cessation advised and Rx chantix Repeat CT Chest 4 /21/15: subpleural lymph nodes, likely benign repeat one year 2016>>>no change, considered benign Arlyce Harman 11/25/2012  NORMAL   [place], post-stroke 07/28/2019   VRE (vancomycin-resistant Enterococci) infection 05/11/2021    Past Surgical History:  Procedure Laterality Date   COLONOSCOPY WITH PROPOFOL N/A 10/25/2020   Jonathon Bellows, MD, at [facility] 25 MM serrated adenoma polyp at ascending colon removed and site clipped.  Pan diverticulosis.   IR CATHETER TUBE CHANGE  09/22/2021   IR CATHETER TUBE CHANGE  10/10/2021   IR RADIOLOGIST EVAL & MGMT  05/25/2021   IR RADIOLOGIST EVAL & MGMT  08/09/2021   IR RADIOLOGIST EVAL & MGMT  08/23/2021   IR RADIOLOGIST EVAL & MGMT  09/21/2021   IR RADIOLOGIST EVAL & MGMT  10/07/2021   LAPAROSCOPIC CHOLECYSTECTOMY  02/2021   PERICARDIOCENTESIS N/A 05/01/2021   Procedure: PERICARDIOCENTESIS;  Surgeon: Martinique, Peter M, MD;  Location: [place] [facility];  Service: Cardiovascular;  Laterality: N/A;   TONSILLECTOMY      Current Medications: Current Meds  Medication Sig   albuterol (VENTOLIN HFA) 108 (90 Base) MCG/ACT inhaler Inhale 2 puffs into the lungs every 6 (six) hours as needed for wheezing or shortness of breath.   apixaban (ELIQUIS) 5 MG TABS tablet TAKE ONE TABLET BY MOUTH EVERY MORNING and TAKE ONE TABLET BY MOUTH EVERY EVENING   clopidogrel (PLAVIX) 75 MG tablet Take 1 tablet (75 mg total) by mouth daily.   cyanocobalamin (VITAMIN B12) 1000 MCG tablet TAKE ONE TABLET BY MOUTH EVERY MORNING   Fluticasone-Umeclidin-Vilant (TRELEGY ELLIPTA) 100-62.5-25 MCG/ACT AEPB Inhale 1 puff into the lungs daily.   lisinopril (ZESTRIL) 20 MG tablet Take 1 tablet (20 mg total) by mouth daily.   metoprolol tartrate (LOPRESSOR) 50 MG tablet Take 1 tablet (50 mg  total) by mouth 2 (two) times daily.   Multiple Vitamin (MULTIVITAMIN WITH MINERALS) TABS tablet Take 1 tablet by mouth daily.   Omega-3 Fatty Acids (FISH OIL) 1000 MG CAPS Take 1 capsule (1,000 mg total) by mouth 2 (two) times daily.   ondansetron (ZOFRAN) 4 MG tablet Take 1 tablet (4 mg total) by mouth every 8 (eight) hours as needed for nausea or vomiting.   pantoprazole (PROTONIX) 40 MG tablet TAKE ONE TABLET BY MOUTH EVERY MORNING and TAKE ONE TABLET BY MOUTH EVERY EVENING Strength: 40 mg   potassium chloride SA (KLOR-CON M) 20 MEQ tablet Take 1 tablet (20 mEq total) by mouth daily.   REPATHA SURECLICK 518 MG/ML SOAJ Inject 140 mg into the skin every 14 (fourteen) days.   rosuvastatin (CRESTOR) 40 MG tablet Take 1 tablet (40 mg total) by mouth at bedtime.     Allergies:   Penicillin g   Social History   Socioeconomic History   Marital status: Married    Spouse name: Not on file   Number of children: Not on file   Years of education: Not on file   Highest  education level: Not on file  Occupational History   Not on file  Tobacco Use   Smoking status: Former    Packs/day: 0.75    Years: 30.00    Total pack years: 22.50    Types: Cigarettes    Start date: 03/14/1979    Quit date: 11/2020    Years since quitting: 1.0   Smokeless tobacco: Never  Vaping Use   Vaping Use: Former   Start date: 07/28/2018  Substance and Sexual Activity   Alcohol use: No   Drug use: No   Sexual activity: Yes    Partners: Female  Other Topics Concern   Not on file  Social History Narrative   Not on file   Social Determinants of Health   Financial Resource Strain: Medium Risk (07/27/2021)   Overall Financial Resource Strain (CARDIA)    Difficulty of Paying Living Expenses: Somewhat hard  Food Insecurity: Not on file  Transportation Needs: No Transportation Needs (07/27/2021)   PRAPARE - Hydrologist (Medical): No    Lack of Transportation (Non-Medical): No   Physical Activity: Not on file  Stress: Not on file  Social Connections: Not on file     Family History: The patient's family history includes Heart attack in his brother and father; Hypertension in his brother, father, and sister; Lung cancer in his sister; Prostate cancer in his father; Stroke in his father.  ROS:   Please see the history of present illness.    All other systems reviewed and are negative.  EKGs/Labs/Other Studies Reviewed:    The following studies were reviewed today: I discussed my findings with the patient at length.   Recent Labs: 11/25/2020: TSH 1.870 07/19/2021: Magnesium 2.0 08/01/2021: Hemoglobin 11.7; Platelets 403 08/24/2021: ALT 16; BUN 24; Creatinine, Ser 1.13; Potassium 4.5; Sodium 141  Recent Lipid Panel    Component Value Date/Time   CHOL 101 07/04/2021 0943   TRIG 143 07/04/2021 0943   HDL 26 (L) 07/04/2021 0943   CHOLHDL 3.9 07/04/2021 0943   LDLCALC 50 07/04/2021 0943    Physical Exam:    VS:  BP 131/80   Pulse 72   Ht '5\' 4"'$  (1.626 m)   Wt 194 lb 3.2 oz (88.1 kg)   SpO2 96%   BMI 33.33 kg/m     Wt Readings from Last 3 Encounters:  11/15/21 194 lb 3.2 oz (88.1 kg)  09/12/21 182 lb 3.2 oz (82.6 kg)  08/24/21 182 lb (82.6 kg)     GEN: Patient is in no acute distress HEENT: Normal NECK: No JVD; No carotid bruits LYMPHATICS: No lymphadenopathy CARDIAC: Hear sounds regular, 2/6 systolic murmur at the apex. RESPIRATORY:  Clear to auscultation without rales, wheezing or rhonchi  ABDOMEN: Soft, non-tender, non-distended MUSCULOSKELETAL:  No edema; No deformity  SKIN: Warm and dry NEUROLOGIC:  Alert and oriented x 3 PSYCHIATRIC:  Normal affect   Signed, Jenean Lindau, MD  11/15/2021 2:43 PM    [place]

## 2021-11-18 DIAGNOSIS — K859 Acute pancreatitis without necrosis or infection, unspecified: Secondary | ICD-10-CM | POA: Diagnosis not present

## 2021-11-18 DIAGNOSIS — K9189 Other postprocedural complications and disorders of digestive system: Secondary | ICD-10-CM | POA: Diagnosis not present

## 2021-11-29 ENCOUNTER — Ambulatory Visit: Payer: Medicare HMO | Attending: Cardiology

## 2021-11-29 DIAGNOSIS — I25118 Atherosclerotic heart disease of native coronary artery with other forms of angina pectoris: Secondary | ICD-10-CM

## 2021-11-29 DIAGNOSIS — Z8673 Personal history of transient ischemic attack (TIA), and cerebral infarction without residual deficits: Secondary | ICD-10-CM

## 2021-11-29 DIAGNOSIS — I10 Essential (primary) hypertension: Secondary | ICD-10-CM | POA: Diagnosis not present

## 2021-11-29 DIAGNOSIS — E782 Mixed hyperlipidemia: Secondary | ICD-10-CM

## 2021-11-29 DIAGNOSIS — I3139 Other pericardial effusion (noninflammatory): Secondary | ICD-10-CM | POA: Diagnosis not present

## 2021-11-29 LAB — ECHOCARDIOGRAM COMPLETE
Area-P 1/2: 3.16 cm2
Calc EF: 53.2 %
S' Lateral: 3.4 cm
Single Plane A2C EF: 52.4 %
Single Plane A4C EF: 52.5 %

## 2021-12-01 ENCOUNTER — Encounter: Payer: Self-pay | Admitting: Family Medicine

## 2021-12-01 ENCOUNTER — Ambulatory Visit (INDEPENDENT_AMBULATORY_CARE_PROVIDER_SITE_OTHER): Payer: Medicare HMO | Admitting: Family Medicine

## 2021-12-01 VITALS — BP 136/80 | HR 54 | Temp 97.7°F | Resp 98 | Ht 64.0 in | Wt 196.0 lb

## 2021-12-01 DIAGNOSIS — N182 Chronic kidney disease, stage 2 (mild): Secondary | ICD-10-CM

## 2021-12-01 DIAGNOSIS — I119 Hypertensive heart disease without heart failure: Secondary | ICD-10-CM | POA: Diagnosis not present

## 2021-12-01 DIAGNOSIS — I25118 Atherosclerotic heart disease of native coronary artery with other forms of angina pectoris: Secondary | ICD-10-CM | POA: Diagnosis not present

## 2021-12-01 DIAGNOSIS — J410 Simple chronic bronchitis: Secondary | ICD-10-CM

## 2021-12-01 DIAGNOSIS — Z122 Encounter for screening for malignant neoplasm of respiratory organs: Secondary | ICD-10-CM

## 2021-12-01 DIAGNOSIS — D6869 Other thrombophilia: Secondary | ICD-10-CM | POA: Diagnosis not present

## 2021-12-01 DIAGNOSIS — E538 Deficiency of other specified B group vitamins: Secondary | ICD-10-CM | POA: Diagnosis not present

## 2021-12-01 DIAGNOSIS — E782 Mixed hyperlipidemia: Secondary | ICD-10-CM

## 2021-12-01 DIAGNOSIS — Z23 Encounter for immunization: Secondary | ICD-10-CM | POA: Diagnosis not present

## 2021-12-01 NOTE — Progress Notes (Signed)
Subjective:  Patient ID: Scott Foley, male    DOB: 1965-11-07  Age: 56 y.o. MRN: [id number]  Chief Complaint  Patient presents with   Hypertension   Hyperlipidemia   Coronary Artery Disease    HPI  Hypertension: Patient is taking Lisinopril 20 mg daily, Metoprolol 50 mg twice a day.  CAD: He is on Plavix 75 mg daily, rosuvastatin 40  mg daily at bedtime. History of PE/DVT. Numerous. On eliquis 5 mg twice daily.   Hyperlipidemia: Rosuvastatin 40 mg daily at bedtime, Repatha 140 mg every 14 days, Fish Oil 1000 mg twice a day.  COPD:  Trelegy 1 puff daily, Albuterol 108 mcg inhaler 2 puff every 6 hours PRN.  GERD: Pantoprazole 40 mg daily.works good.   Current Outpatient Medications on File Prior to Visit  Medication Sig Dispense Refill   albuterol (VENTOLIN HFA) 108 (90 Base) MCG/ACT inhaler Inhale 2 puffs into the lungs every 6 (six) hours as needed for wheezing or shortness of breath. 8 g 2   apixaban (ELIQUIS) 5 MG TABS tablet TAKE ONE TABLET BY MOUTH EVERY MORNING and TAKE ONE TABLET BY MOUTH EVERY EVENING 180 tablet 1   clopidogrel (PLAVIX) 75 MG tablet Take 1 tablet (75 mg total) by mouth daily. 90 tablet 1   cyanocobalamin (VITAMIN B12) 1000 MCG tablet TAKE ONE TABLET BY MOUTH EVERY MORNING 30 tablet 1   Fluticasone-Umeclidin-Vilant (TRELEGY ELLIPTA) 100-62.5-25 MCG/ACT AEPB Inhale 1 puff into the lungs daily. 1 each 11   lisinopril (ZESTRIL) 20 MG tablet Take 1 tablet (20 mg total) by mouth daily. 90 tablet 0   metoprolol tartrate (LOPRESSOR) 50 MG tablet Take 1 tablet (50 mg total) by mouth 2 (two) times daily. 180 tablet 0   Multiple Vitamin (MULTIVITAMIN WITH MINERALS) TABS tablet Take 1 tablet by mouth daily.     Omega-3 Fatty Acids (FISH OIL) 1000 MG CAPS Take 1 capsule (1,000 mg total) by mouth 2 (two) times daily. 180 capsule 0   ondansetron (ZOFRAN) 4 MG tablet Take 1 tablet (4 mg total) by mouth every 8 (eight) hours as needed for nausea or vomiting. 20  tablet 0   pantoprazole (PROTONIX) 40 MG tablet TAKE ONE TABLET BY MOUTH EVERY MORNING and TAKE ONE TABLET BY MOUTH EVERY EVENING Strength: 40 mg 180 tablet 1   potassium chloride SA (KLOR-CON M) 20 MEQ tablet Take 1 tablet (20 mEq total) by mouth daily. 30 tablet 3   REPATHA SURECLICK 195 MG/ML SOAJ Inject 140 mg into the skin every 14 (fourteen) days.     rosuvastatin (CRESTOR) 40 MG tablet Take 1 tablet (40 mg total) by mouth at bedtime. 90 tablet 0   No current facility-administered medications on file prior to visit.   Past Medical History:  Diagnosis Date   Acquired thrombophilia ([place]) 12/06/2020   Acute pancreatitis after endoscopic retrograde cholangiopancreatography (ERCP) 03/03/2021   ERCP by Dr Odie Sera in [place] 03/03/2021   B12 deficiency 07/04/2021   Chronic obstructive pulmonary disease ([place]) 07/16/2018   CKD (chronic kidney disease), stage II 07/04/2021   Colon polyp 10/25/2020   serrated adenoma.   Coronary artery disease due to lipid rich plaque 01/01/2021   Coronary artery disease with stable angina pectoris [facility])    AMI No heart cath, treated medically [facility]   Diverticulosis of colon 10/25/2020   Noted on colnoscopy by Dr Vicente Males in [place].   DNR (do not resuscitate) 03/03/2019   Encounter for hepatitis C screening test for low risk patient  07/04/2021   Essential hypertension 02/15/2021   History of blood clotting disorder 06/11/2021   History of CVA (cerebrovascular accident) 07/16/2018   Formatting of this note might be different from the original. CVA 3 years ago left with short term memory deficits. Has been on Plavix since.   History of pulmonary embolism 02/15/2021   Hyperlipemia    Hypertension    Hypertensive heart disease without congestive heart failure 12/06/2020   Hypoglycemia 04/15/2021   Hypokalemia 04/13/2021   Hypomagnesemia 06/11/2021   Intra-abdominal fluid collection 07/18/2021   Medication monitoring encounter 06/24/2021   Morbid obesity ([place])  12/06/2020   BMI 35 with serious comorbidities including CAD, HTN, STROKE, Hyperlipidemia.   Normocytic anemia 04/15/2021   Pericardial effusion 04/29/2021   Pericardial tamponade    Protein-calorie malnutrition, severe 04/15/2021   Pulmonary nodules/lesions, multiple 11/25/2012   9/15/2014CT Chest : small nodule RUL LUL. Stable since 03/2012 Smoking cessation advised and Rx chantix Repeat CT Chest 4 /21/15: subpleural lymph nodes, likely benign repeat one year 2016>>>no change, considered benign Arlyce Harman 11/25/2012  NORMAL   [place], post-stroke 07/28/2019   VRE (vancomycin-resistant Enterococci) infection 05/11/2021   Past Surgical History:  Procedure Laterality Date   COLONOSCOPY WITH PROPOFOL N/A 10/25/2020   Jonathon Bellows, MD, at [facility]. 25 MM serrated adenoma polyp at ascending colon removed and site clipped.  Pan diverticulosis.   IR CATHETER TUBE CHANGE  09/22/2021   IR CATHETER TUBE CHANGE  10/10/2021   IR RADIOLOGIST EVAL & MGMT  05/25/2021   IR RADIOLOGIST EVAL & MGMT  08/09/2021   IR RADIOLOGIST EVAL & MGMT  08/23/2021   IR RADIOLOGIST EVAL & MGMT  09/21/2021   IR RADIOLOGIST EVAL & MGMT  10/07/2021   LAPAROSCOPIC CHOLECYSTECTOMY  02/2021   PERICARDIOCENTESIS N/A 05/01/2021   Procedure: PERICARDIOCENTESIS;  Surgeon: Martinique, Peter M, MD;  Location: [place] [facility];  Service: Cardiovascular;  Laterality: N/A;   TONSILLECTOMY      Family History  Problem Relation Age of Onset   Hypertension Father    Heart attack Father    Stroke Father    Prostate cancer Father    Hypertension Sister    Lung cancer Sister    Cancer Brother        lung   Hypertension Brother    Heart attack Brother    Social History   Socioeconomic History   Marital status: Married    Spouse name: Not on file   Number of children: Not on file   Years of education: Not on file   Highest education level: Not on file  Occupational History   Not on file  Tobacco Use   Smoking status: Former    Packs/day: 0.75     Years: 30.00    Total pack years: 22.50    Types: Cigarettes    Start date: 03/14/1979    Quit date: 11/2020    Years since quitting: 1.0   Smokeless tobacco: Never  Vaping Use   Vaping Use: Former   Start date: 07/28/2018  Substance and Sexual Activity   Alcohol use: No   Drug use: No   Sexual activity: Yes    Partners: Female  Other Topics Concern   Not on file  Social History Narrative   Not on file   Social Determinants of Health   Financial Resource Strain: Medium Risk (07/27/2021)   Overall Financial Resource Strain (CARDIA)    Difficulty of Paying Living Expenses: Somewhat hard  Food Insecurity: Not on file  Transportation Needs: No Transportation Needs (07/27/2021)   PRAPARE - Hydrologist (Medical): No    Lack of Transportation (Non-Medical): No  Physical Activity: Not on file  Stress: Not on file  Social Connections: Not on file    Review of Systems  Constitutional:  Negative for chills, fatigue, fever and unexpected weight change.  HENT:  Negative for congestion, ear pain, sinus pain and sore throat.   Respiratory:  Negative for cough and shortness of breath.   Cardiovascular:  Negative for chest pain and palpitations.  Gastrointestinal:  Negative for abdominal pain, blood in stool, constipation, diarrhea, nausea and vomiting.  Endocrine: Negative for polydipsia.  Genitourinary:  Negative for dysuria.  Musculoskeletal:  Negative for back pain.  Skin:  Negative for rash.  Neurological:  Negative for headaches.     Objective:  BP 136/80   Pulse (!) 54   Temp 97.7 F (36.5 C)   Resp (!) 98   Ht '5\' 4"'$  (1.626 m)   Wt 196 lb (88.9 kg)   SpO2 97%   BMI 33.64 kg/m      12/01/2021    8:17 AM 11/15/2021    2:01 PM 10/07/2021    1:00 PM  BP/Weight  Systolic BP 629 476 546  Diastolic BP 80 80 98  Wt. (Lbs) 196 194.2   BMI 33.64 kg/m2 33.33 kg/m2     Physical Exam Vitals reviewed.  Constitutional:      Appearance: Normal  appearance. He is obese.  Neck:     Vascular: No carotid bruit.  Cardiovascular:     Rate and Rhythm: Normal rate and regular rhythm.     Heart sounds: Normal heart sounds.  Pulmonary:     Effort: Pulmonary effort is normal.     Breath sounds: Normal breath sounds. No wheezing, rhonchi or rales.  Abdominal:     General: Bowel sounds are normal.     Palpations: Abdomen is soft.     Tenderness: There is no abdominal tenderness.  Neurological:     Mental Status: He is alert and oriented to person, place, and time.  Psychiatric:        Mood and Affect: Mood normal.        Behavior: Behavior normal.     Diabetic Foot Exam - Simple   No data filed      Lab Results  Component Value Date   WBC 9.5 12/01/2021   HGB 15.3 12/01/2021   HCT 46.2 12/01/2021   PLT 285 12/01/2021   GLUCOSE 86 12/01/2021   CHOL 73 (L) 12/01/2021   TRIG 156 (H) 12/01/2021   HDL 37 (L) 12/01/2021   LDLCALC 10 12/01/2021   ALT 28 12/01/2021   AST 18 12/01/2021   NA 136 12/01/2021   K 5.2 12/01/2021   CL 100 12/01/2021   CREATININE 1.42 (H) 12/01/2021   BUN 26 (H) 12/01/2021   CO2 23 12/01/2021   TSH 1.870 11/25/2020   INR 1.2 07/21/2021      Assessment & Plan:   Problem List Items Addressed This Visit       Cardiovascular and Mediastinum   Coronary artery disease with stable angina pectoris ([place])    Well controlled.  No changes to medicines. Rosuvastatin 40 mg daily at bedtime, Repatha 140 mg every 14 days, Fish Oil 1000 mg twice a day. Continue plavix. Continue to work on eating a healthy diet and exercise.  Labs drawn today.  Hypertensive heart disease without congestive heart failure - Primary   Relevant Orders   Comprehensive metabolic panel (Completed)   CBC with Differential/Platelet (Completed)   Cardiovascular Risk Assessment (Completed)     Respiratory   Chronic obstructive pulmonary disease (HCC)    Continue  Trelegy 1 puff daily, Albuterol 108 mcg inhaler 2 puff  every 6 hours PRN.        Genitourinary   CKD (chronic kidney disease), stage II    stable        Hematopoietic and Hemostatic   Acquired thrombophilia ([place])    On eliquis due to history of recurrent dvt/PEs.        Other   Hyperlipemia    Well controlled.  No changes to medicines. Continue Rosuvastatin 40 mg daily at bedtime, Repatha 140 mg every 14 days, Fish Oil 1000 mg twice a day. Continue to work on eating a healthy diet and exercise.  Labs drawn today.        Relevant Orders   Lipid panel (Completed)   B12 deficiency    Continue B12.       Other Visit Diagnoses     Encounter for screening for lung cancer       Relevant Orders   CT CHEST LUNG CANCER SCREENING LOW DOSE WO CONTRAST   Encounter for immunization       Relevant Orders   Flu Vaccine MDCK QUAD PF (Completed)     .  No orders of the defined types were placed in this encounter.   Orders Placed This Encounter  Procedures   CT CHEST LUNG CANCER SCREENING LOW DOSE WO CONTRAST   Flu Vaccine MDCK QUAD PF   Comprehensive metabolic panel   Lipid panel   CBC with Differential/Platelet   Cardiovascular Risk Assessment     Follow-up: Return in about 4 months (around 04/02/2022).  An After Visit Summary was printed and given to the patient.  [name], MD [facility] [phone]

## 2021-12-03 LAB — COMPREHENSIVE METABOLIC PANEL
ALT: 28 IU/L (ref 0–44)
AST: 18 IU/L (ref 0–40)
Albumin/Globulin Ratio: 1.6 (ref 1.2–2.2)
Albumin: 4.8 g/dL (ref 3.8–4.9)
Alkaline Phosphatase: 89 IU/L (ref 44–121)
BUN/Creatinine Ratio: 18 (ref 9–20)
BUN: 26 mg/dL — ABNORMAL HIGH (ref 6–24)
Bilirubin Total: 0.5 mg/dL (ref 0.0–1.2)
CO2: 23 mmol/L (ref 20–29)
Calcium: 10.1 mg/dL (ref 8.7–10.2)
Chloride: 100 mmol/L (ref 96–106)
Creatinine, Ser: 1.42 mg/dL — ABNORMAL HIGH (ref 0.76–1.27)
Globulin, Total: 3 g/dL (ref 1.5–4.5)
Glucose: 86 mg/dL (ref 70–99)
Potassium: 5.2 mmol/L (ref 3.5–5.2)
Sodium: 136 mmol/L (ref 134–144)
Total Protein: 7.8 g/dL (ref 6.0–8.5)
eGFR: 58 mL/min/{1.73_m2} — ABNORMAL LOW (ref >59)

## 2021-12-03 LAB — CBC WITH DIFFERENTIAL/PLATELET
Basophils Absolute: 0.1 10*3/uL (ref 0.0–0.2)
Basos: 1 %
EOS (ABSOLUTE): 0.1 10*3/uL (ref 0.0–0.4)
Eos: 2 %
Hematocrit: 46.2 % (ref 37.5–51.0)
Hemoglobin: 15.3 g/dL (ref 13.0–17.7)
Immature Grans (Abs): 0 10*3/uL (ref 0.0–0.1)
Immature Granulocytes: 0 %
Lymphocytes Absolute: 3.8 10*3/uL — ABNORMAL HIGH (ref 0.7–3.1)
Lymphs: 40 %
MCH: 28.8 pg (ref 26.6–33.0)
MCHC: 33.1 g/dL (ref 31.5–35.7)
MCV: 87 fL (ref 79–97)
Monocytes Absolute: 0.7 10*3/uL (ref 0.1–0.9)
Monocytes: 7 %
Neutrophils Absolute: 4.7 10*3/uL (ref 1.4–7.0)
Neutrophils: 50 %
Platelets: 285 10*3/uL (ref 150–450)
RBC: 5.31 x10E6/uL (ref 4.14–5.80)
RDW: 15.9 % — ABNORMAL HIGH (ref 11.6–15.4)
WBC: 9.5 10*3/uL (ref 3.4–10.8)

## 2021-12-03 LAB — CARDIOVASCULAR RISK ASSESSMENT

## 2021-12-03 LAB — LIPID PANEL
Chol/HDL Ratio: 2 ratio (ref 0.0–5.0)
Cholesterol, Total: 73 mg/dL — ABNORMAL LOW (ref 100–199)
HDL: 37 mg/dL — ABNORMAL LOW (ref >39)
LDL Chol Calc (NIH): 10 mg/dL (ref 0–99)
Triglycerides: 156 mg/dL — ABNORMAL HIGH (ref 0–149)
VLDL Cholesterol Cal: 26 mg/dL (ref 5–40)

## 2021-12-05 ENCOUNTER — Telehealth: Payer: Self-pay | Admitting: Cardiology

## 2021-12-05 DIAGNOSIS — Z122 Encounter for screening for malignant neoplasm of respiratory organs: Secondary | ICD-10-CM | POA: Diagnosis not present

## 2021-12-05 DIAGNOSIS — Z87891 Personal history of nicotine dependence: Secondary | ICD-10-CM | POA: Diagnosis not present

## 2021-12-05 NOTE — Assessment & Plan Note (Signed)
Continue  Trelegy 1 puff daily, Albuterol 108 mcg inhaler 2 puff every 6 hours PRN.

## 2021-12-05 NOTE — Assessment & Plan Note (Signed)
Well controlled.  No changes to medicines. Continue Rosuvastatin 40 mg daily at bedtime, Repatha 140 mg every 14 days, Fish Oil 1000 mg twice a day. Continue to work on eating a healthy diet and exercise.  Labs drawn today.

## 2021-12-05 NOTE — Assessment & Plan Note (Signed)
>>  ASSESSMENT AND PLAN FOR HYPERLIPEMIA WRITTEN ON 12/05/2021 12:18 AM BY [name], [name], MD  Well controlled.  No changes to medicines. Continue Rosuvastatin  40 mg daily at bedtime, Repatha  140 mg every 14 days, Fish Oil  1000 mg twice a day. Continue to work on eating a healthy diet and exercise.  Labs drawn today.

## 2021-12-05 NOTE — Telephone Encounter (Signed)
Pt calling to go over echo results

## 2021-12-05 NOTE — Assessment & Plan Note (Signed)
stable °

## 2021-12-05 NOTE — Assessment & Plan Note (Signed)
On eliquis due to history of recurrent dvt/PEs.

## 2021-12-05 NOTE — Assessment & Plan Note (Signed)
Continue B12.

## 2021-12-05 NOTE — Assessment & Plan Note (Signed)
Well controlled.  No changes to medicines. Rosuvastatin 40 mg daily at bedtime, Repatha 140 mg every 14 days, Fish Oil 1000 mg twice a day. Continue plavix. Continue to work on eating a healthy diet and exercise.  Labs drawn today.

## 2021-12-06 NOTE — Telephone Encounter (Signed)
Results reviewed with pt as per Dr. Revankar's note.  Pt verbalized understanding and had no additional questions. Routed to PCP.  

## 2021-12-06 NOTE — Progress Notes (Signed)
Blood count normal.  Liver function normal.  Kidney function abnormal. Worsened.  Repeat cmp in 2 weeks. No nsaids.  Cholesterol: trigs little worse. Hdl improved. LDL great.  HBA1C:

## 2021-12-09 ENCOUNTER — Encounter: Payer: Self-pay | Admitting: Family Medicine

## 2021-12-14 ENCOUNTER — Telehealth: Payer: Self-pay

## 2021-12-14 NOTE — Progress Notes (Signed)
Chronic Care Management Pharmacy Assistant   Name: Scott Foley  MRN: [id number] DOB: [birth year]   Reason for Encounter: Medication Coordination for [place]    Recent office visits:  12/01/21 [name] MD. Seen for routine visit. No med changes.  Recent consult visits:  11/18/21 [facility]) Miguel Dibble MD. Seen for follow up. No med changes.  11/15/21 (Cardiology) Jyl Heinz MD. Seen for CAD. No med changes.  Hospital visits:  None  Medications: Outpatient Encounter Medications as of 12/14/2021  Medication Sig   albuterol (VENTOLIN HFA) 108 (90 Base) MCG/ACT inhaler Inhale 2 puffs into the lungs every 6 (six) hours as needed for wheezing or shortness of breath.   apixaban (ELIQUIS) 5 MG TABS tablet TAKE ONE TABLET BY MOUTH EVERY MORNING and TAKE ONE TABLET BY MOUTH EVERY EVENING   clopidogrel (PLAVIX) 75 MG tablet Take 1 tablet (75 mg total) by mouth daily.   cyanocobalamin (VITAMIN B12) 1000 MCG tablet TAKE ONE TABLET BY MOUTH EVERY MORNING   Fluticasone-Umeclidin-Vilant (TRELEGY ELLIPTA) 100-62.5-25 MCG/ACT AEPB Inhale 1 puff into the lungs daily.   lisinopril (ZESTRIL) 20 MG tablet Take 1 tablet (20 mg total) by mouth daily.   metoprolol tartrate (LOPRESSOR) 50 MG tablet Take 1 tablet (50 mg total) by mouth 2 (two) times daily.   Multiple Vitamin (MULTIVITAMIN WITH MINERALS) TABS tablet Take 1 tablet by mouth daily.   Omega-3 Fatty Acids (FISH OIL) 1000 MG CAPS Take 1 capsule (1,000 mg total) by mouth 2 (two) times daily.   ondansetron (ZOFRAN) 4 MG tablet Take 1 tablet (4 mg total) by mouth every 8 (eight) hours as needed for nausea or vomiting.   pantoprazole (PROTONIX) 40 MG tablet TAKE ONE TABLET BY MOUTH EVERY MORNING and TAKE ONE TABLET BY MOUTH EVERY EVENING Strength: 40 mg   potassium chloride SA (KLOR-CON M) 20 MEQ tablet Take 1 tablet (20 mEq total) by mouth daily.   REPATHA SURECLICK 295 MG/ML SOAJ Inject 140 mg into the skin every  14 (fourteen) days.   rosuvastatin (CRESTOR) 40 MG tablet Take 1 tablet (40 mg total) by mouth at bedtime.   No facility-administered encounter medications on file as of 12/14/2021.    Reviewed chart for medication changes ahead of medication coordination call.  No hospital visits since last care coordination call/Pharmacist visit.   No medication changes indicated OR if recent visit, treatment plan here.  BP Readings from Last 3 Encounters:  12/01/21 136/80  11/15/21 131/80  10/07/21 (!) 152/98    No results found for: "HGBA1C"   Patient obtains medications through Adherence Packaging  30 Days   Last adherence delivery included:  Eliquis '5mg'$  1 at B and 1 EM Clopidogrel '75mg'$  1 B Vitamin B12 1084mg 1 B Trelegy Inhaler 1061m 1 puff once daily  Metoprolol Tar '50mg'$  1 B and 1 EM Mens Multi 1 at EM Fish Oil '1000mg'$  1 B and 1 EM Pantoprazole '40mg'$  1 B and 1 EM Rosuvastatin '40mg'$  1 EM Lisinopril '20mg'$  1 at B Potassium Chloride 2054m 1 in B Repatha Sureclick '140mg'$ - Inject '140mg'$  every 14 days  Albuterol 108m85mnhaler- Inhale 2 puffs into the lungs every 6 (six) hours as needed   Patient declined (meds) last month None  Patient is due for next adherence delivery on: 12/26/21. Called patient and reviewed medications and coordinated delivery.  This delivery to include: Eliquis '5mg'$  1 at B and 1 EM Clopidogrel '75mg'$  1 B Vitamin B12 1000mc41mB Trelegy Inhaler 100mcg39m  1 puff once daily  Metoprolol Tar '50mg'$  1 B and 1 EM Mens Multi 1 at EM Fish Oil '1000mg'$  1 B and 1 EM Pantoprazole '40mg'$  1 B and 1 EM Rosuvastatin '40mg'$  1 EM Lisinopril '20mg'$  1 at B Potassium Chloride 62mq- 1 in B Repatha Sureclick '140mg'$ - Inject '140mg'$  every 14 days  Albuterol 1034m inhaler- Inhale 2 puffs into the lungs every 6 (six) hours as needed   Patient declined the following medications  None  Patient needs refills-Refills Requested  Metoprolol Tar '50mg'$  Rosuvastatin '40mg'$   Confirmed delivery date of  12/26/21, advised patient that pharmacy will contact them the morning of delivery.   DaElray McgregorCMWinchesterharmacist Assistant  [phone]

## 2021-12-15 ENCOUNTER — Other Ambulatory Visit: Payer: Medicare HMO

## 2021-12-15 ENCOUNTER — Other Ambulatory Visit: Payer: Self-pay

## 2021-12-15 DIAGNOSIS — I119 Hypertensive heart disease without heart failure: Secondary | ICD-10-CM

## 2021-12-15 DIAGNOSIS — N182 Chronic kidney disease, stage 2 (mild): Secondary | ICD-10-CM

## 2021-12-15 DIAGNOSIS — E782 Mixed hyperlipidemia: Secondary | ICD-10-CM

## 2021-12-15 MED ORDER — ROSUVASTATIN CALCIUM 40 MG PO TABS: 40.0000 mg | ORAL_TABLET | Freq: Every day | ORAL | 0 refills | Status: DC

## 2021-12-15 MED ORDER — METOPROLOL TARTRATE 50 MG PO TABS: 50.0000 mg | ORAL_TABLET | Freq: Two times a day (BID) | ORAL | 0 refills | Status: DC

## 2021-12-16 LAB — COMPREHENSIVE METABOLIC PANEL
ALT: 26 IU/L (ref 0–44)
AST: 21 IU/L (ref 0–40)
Albumin/Globulin Ratio: 1.6 (ref 1.2–2.2)
Albumin: 4.6 g/dL (ref 3.8–4.9)
Alkaline Phosphatase: 87 IU/L (ref 44–121)
BUN/Creatinine Ratio: 17 (ref 9–20)
BUN: 25 mg/dL — ABNORMAL HIGH (ref 6–24)
Bilirubin Total: 0.2 mg/dL (ref 0.0–1.2)
CO2: 17 mmol/L — ABNORMAL LOW (ref 20–29)
Calcium: 9.7 mg/dL (ref 8.7–10.2)
Chloride: 103 mmol/L (ref 96–106)
Creatinine, Ser: 1.47 mg/dL — ABNORMAL HIGH (ref 0.76–1.27)
Globulin, Total: 2.8 g/dL (ref 1.5–4.5)
Glucose: 93 mg/dL (ref 70–99)
Potassium: 5.1 mmol/L (ref 3.5–5.2)
Sodium: 135 mmol/L (ref 134–144)
Total Protein: 7.4 g/dL (ref 6.0–8.5)
eGFR: 55 mL/min/{1.73_m2} — ABNORMAL LOW (ref >59)

## 2021-12-19 ENCOUNTER — Other Ambulatory Visit: Payer: Self-pay | Admitting: Family Medicine

## 2021-12-29 ENCOUNTER — Ambulatory Visit (INDEPENDENT_AMBULATORY_CARE_PROVIDER_SITE_OTHER): Payer: Medicare HMO

## 2021-12-29 DIAGNOSIS — I10 Essential (primary) hypertension: Secondary | ICD-10-CM

## 2021-12-29 DIAGNOSIS — I119 Hypertensive heart disease without heart failure: Secondary | ICD-10-CM

## 2021-12-29 DIAGNOSIS — J449 Chronic obstructive pulmonary disease, unspecified: Secondary | ICD-10-CM

## 2021-12-29 NOTE — Progress Notes (Signed)
Chronic Care Management Pharmacy Note  12/29/2021 Name:  Irvan Tiedt MRN:  [id number] DOB:  August 27, 1965  Summary: -Pleasant 56 year old male and his wife Dawm present for initial CCM visit. They were both CNA's. Patient used to love hiking but is unable to do now with his recent hospital problems. He enjoys watching movies, specifically comedies. He has been married for 21 years with 2 Children and 3 dogs. They've been living [place] a campground for 3 years while they wait to renovate their home. It's been put on pause due to recent medical issues.  Recommendations/Changes made from today's visit: -CAT assessment completed -Patient states his BP was "180 over something yesterday." When I asked him to test BP today it was 115/80. Asked him to test BP daily and write down over next week. Will f/u then. Counseled to call us if above 937 systolic again  Subjective: Nicolai Labonte is an 56 y.o. year old male who is a primary patient of [name], [name], MD.  The CCM team was consulted for assistance with disease management and care coordination needs.    Engaged with patient by telephone for follow up visit in response to provider referral for pharmacy case management and/or care coordination services.   Consent to Services:  The patient was given the following information about Chronic Care Management services today, agreed to services, and gave verbal consent: 1. CCM service includes personalized support from designated clinical staff supervised by the primary care provider, including individualized plan of care and coordination with other care providers 2. 24/7 contact phone numbers for assistance for urgent and routine care needs. 3. Service will only be billed when office clinical staff spend 20 minutes or more in a month to coordinate care. 4. Only one practitioner may furnish and bill the service in a calendar month. 5.The patient may stop CCM services at any time (effective at the end  of the month) by phone call to the office staff. 6. The patient will be responsible for cost sharing (co-pay) of up to 20% of the service fee (after annual deductible is met). Patient agreed to services and consent obtained.  Patient Care Team: [name], MD as PCP - General (Family Medicine) Sanda Klein, MD as PCP - Cardiology (Cardiology) Lane Hacker, [facility] as Pharmacist (Pharmacist)  Recent office visits:  12/01/21 [name] MD. Seen for routine visit. No med changes.   Recent consult visits:  11/18/21 [facility] Surgery) Miguel Dibble MD. Seen for follow up. No med changes.   11/15/21 (Cardiology) Jyl Heinz MD. Seen for CAD. No med changes.   Hospital visits:  None     Objective:  Lab Results  Component Value Date   CREATININE 1.47 (H) 12/15/2021   BUN 25 (H) 12/15/2021   EGFR 55 (L) 12/15/2021   GFRNONAA >60 07/23/2021   GFRAA 105 12/15/2019   NA 135 12/15/2021   K 5.1 12/15/2021   CALCIUM 9.7 12/15/2021   CO2 17 (L) 12/15/2021   GLUCOSE 93 12/15/2021    No results found for: "HGBA1C", "FRUCTOSAMINE", "GFR", "MICROALBUR"  Last diabetic Eye exam: No results found for: "HMDIABEYEEXA"  Last diabetic Foot exam: No results found for: "HMDIABFOOTEX"   Lab Results  Component Value Date   CHOL 73 (L) 12/01/2021   HDL 37 (L) 12/01/2021   LDLCALC 10 12/01/2021   TRIG 156 (H) 12/01/2021   CHOLHDL 2.0 12/01/2021       Latest Ref Rng & Units 12/15/2021  9:23 AM 12/01/2021    9:15 AM 08/24/2021   10:22 AM  Hepatic Function  Total Protein 6.0 - 8.5 g/dL 7.4  7.8  7.7   Albumin 3.8 - 4.9 g/dL 4.6  4.8  4.5   AST 0 - 40 IU/L _0 ALT 0 - 44 IU/L _1 Alk Phosphatase 44 - 121 IU/L 87  89  80   Total Bilirubin 0.0 - 1.2 mg/dL 0.2  0.5  <0.2     Lab Results  Component Value Date/Time   TSH 1.870 11/25/2020 10:32 AM   TSH 2.390 08/23/2020 09:31 AM       Latest Ref Rng & Units 12/01/2021    9:15 AM 08/01/2021   11:28 AM  07/23/2021    1:54 AM  CBC  WBC 3.4 - 10.8 x10E3/uL 9.5  9.2  9.5   Hemoglobin 13.0 - 17.7 g/dL 15.3  11.7  9.9   Hematocrit 37.5 - 51.0 % 46.2  37.6  31.2   Platelets 150 - 450 x10E3/uL 285  403  319     Lab Results  Component Value Date/Time   VD25OH 48.4 07/04/2021 09:43 AM    Clinical ASCVD: Yes  The ASCVD Risk score (Arnett DK, et al., 2019) failed to calculate for the following reasons:   The valid total cholesterol range is 130 to 320 mg/dL       09/12/2021    2:02 PM 06/22/2021    3:16 PM 12/30/2020    1:37 PM  Depression screen PHQ 2/9  Decreased Interest 0 0 0  Down, Depressed, Hopeless 0 0 0  PHQ - 2 Score 0 0 0     Other: (CHADS2VASc if Afib, MMRC or CAT for COPD, ACT, DEXA)  Social History   Tobacco Use  Smoking Status Former   Packs/day: 0.75   Years: 30.00   Total pack years: 22.50   Types: Cigarettes   Start date: 03/14/1979   Quit date: 11/2020   Years since quitting: 1.1  Smokeless Tobacco Never   BP Readings from Last 3 Encounters:  12/01/21 136/80  11/15/21 131/80  10/07/21 (!) 152/98   Pulse Readings from Last 3 Encounters:  12/01/21 (!) 54  11/15/21 72  10/07/21 67   Wt Readings from Last 3 Encounters:  12/01/21 196 lb (88.9 kg)  11/15/21 194 lb 3.2 oz (88.1 kg)  09/12/21 182 lb 3.2 oz (82.6 kg)   BMI Readings from Last 3 Encounters:  12/01/21 33.64 kg/m  11/15/21 33.33 kg/m  09/12/21 31.27 kg/m    Assessment/Interventions: Review of patient past medical history, allergies, medications, health status, including review of consultants reports, laboratory and other test data, was performed as part of comprehensive evaluation and provision of chronic care management services.   SDOH:  (Social Determinants of Health) assessments and interventions performed: Yes SDOH Interventions    Flowsheet Row Chronic Care Management from 12/29/2021 in [place] Management from 07/27/2021 in [place]  Management from 05/19/2021 in [place] Interventions     Transportation Interventions Intervention Not Indicated Intervention Not Indicated Intervention Not Indicated  Financial Strain Interventions Other (Comment)  [PAP] Other (Comment)  [PAP (See CP)] Other (Comment)  [Sync Meds to be delievered beginning of month (Paid on 3rd of month)]      SDOH Screenings   Transportation Needs: No Transportation Needs (12/29/2021)  Alcohol Screen: Low Risk  (12/30/2020)  Depression (PHQ2-9): Low Risk  (09/12/2021)  Financial Resource Strain: Medium Risk (12/29/2021)  Tobacco Use: Medium Risk (12/01/2021)    CCM Care Plan  Allergies  Allergen Reactions   Penicillin G Hives    Medications Reviewed Today     Reviewed by Lane Hacker, [facility] (Pharmacist) on 12/29/21 at 1132  Med List Status: <None>   Medication Order Taking? Sig Documenting Provider Last Dose Status Informant  albuterol (VENTOLIN HFA) 108 (90 Base) MCG/ACT inhaler [id number] No Inhale 2 puffs into the lungs every 6 (six) hours as needed for wheezing or shortness of breath. [name], [name], MD Taking Active   apixaban (ELIQUIS) 5 MG TABS tablet [id number] No TAKE ONE TABLET BY MOUTH EVERY MORNING and TAKE ONE TABLET BY MOUTH EVERY EVENING [name], [name], MD Taking Active   clopidogrel (PLAVIX) 75 MG tablet [id number] No Take 1 tablet (75 mg total) by mouth daily. [name], [name], MD Taking Active   cyanocobalamin (VITAMIN B12) 1000 MCG tablet [id number] No TAKE ONE TABLET BY MOUTH EVERY MORNING [name], [name], MD Taking Active   Fluticasone-Umeclidin-Vilant (TRELEGY ELLIPTA) 100-62.5-25 MCG/ACT AEPB [id number] No Inhale 1 puff into the lungs daily. [name], [name], MD Taking Active Self  lisinopril (ZESTRIL) 20 MG tablet [id number] No Take 1 tablet (20 mg total) by mouth daily. [name], [name], MD Taking Active   metoprolol tartrate (LOPRESSOR) 50 MG tablet [id number]  Take 1 tablet (50 mg total) by mouth 2 (two) times daily. [name], [name],  MD  Active   Multiple Vitamin (MULTIVITAMIN WITH MINERALS) TABS tablet [id number] No Take 1 tablet by mouth daily. Hosie Poisson, MD Taking Active Self  Omega-3 Fatty Acids (FISH OIL) 1000 MG CAPS [id number] No Take 1 capsule (1,000 mg total) by mouth 2 (two) times daily. [name], [name], MD Taking Active Self  ondansetron (ZOFRAN) 4 MG tablet [id number] No Take 1 tablet (4 mg total) by mouth every 8 (eight) hours as needed for nausea or vomiting. [name], [name], MD Taking Active   pantoprazole (PROTONIX) 40 MG tablet [id number] No TAKE ONE TABLET BY MOUTH EVERY MORNING and TAKE ONE TABLET BY MOUTH EVERY EVENING Strength: 40 mg [name], [name], MD Taking Active   potassium chloride SA (KLOR-CON M) 20 MEQ tablet [id number] No Take 1 tablet (20 mEq total) by mouth daily. [name], MD Taking Active   REPATHA SURECLICK 616 MG/ML Darden Palmer [id number] No Inject 140 mg into the skin every 14 (fourteen) days. [provider] Taking Active   rosuvastatin (CRESTOR) 40 MG tablet [id number]  Take 1 tablet (40 mg total) by mouth at bedtime. [name], MD  Active             Patient Active Problem List   Diagnosis Date Noted   Intra-abdominal fluid collection 07/18/2021   B12 deficiency 07/04/2021   CKD (chronic kidney disease), stage II 07/04/2021   Medication monitoring encounter 06/24/2021   History of blood clotting disorder 06/11/2021   Hypomagnesemia 06/11/2021   VRE (vancomycin-resistant Enterococci) infection 05/11/2021   Pericardial effusion 04/29/2021   Normocytic anemia 04/15/2021   Hypoglycemia 04/15/2021   Protein-calorie malnutrition, severe 04/15/2021   Hypokalemia 04/13/2021   History of pulmonary embolism 02/15/2021   Coronary artery disease due to lipid rich plaque 01/01/2021   Hypertensive heart disease without congestive heart failure 12/06/2020   Acquired thrombophilia ([place]) 12/06/2020   Morbid obesity ([place]) 12/06/2020   Colon polyp 10/25/2020   Diverticulosis of colon  10/25/2020   [place]ela, post-stroke 07/28/2019   DNR (do not resuscitate) 03/03/2019  History of CVA (cerebrovascular accident) 07/16/2018   Chronic obstructive pulmonary disease ([place]) 07/16/2018   Pulmonary nodules/lesions, multiple 11/25/2012   Coronary artery disease with stable angina pectoris ([place])    Hyperlipemia     Immunization History  Administered Date(s) Administered   Influenza Inj Mdck Quad Pf 12/15/2019, 12/30/2020, 12/01/2021   Influenza Split 01/03/2012   Influenza,inj,Quad PF,6+ Mos 11/25/2012   PNEUMOCOCCAL CONJUGATE-20 12/30/2020   Pneumococcal Polysaccharide-23 11/25/2012    Conditions to be addressed/monitored:  Hypertension, Hyperlipidemia, GERD, and COPD  Care Plan : [place]  Updates made by Lane Hacker, [place] since 12/29/2021 12:00 AM     Problem: Disease State Management   Priority: High  Onset Date: 05/19/2021     Long-Range Goal: Lipids, Cardio, GERD   Start Date: 05/19/2021  Expected End Date: 05/19/2022  Recent Progress: On track  Priority: High  Note:   Current Barriers:  Does not contact provider office for questions/concerns  Pharmacist Clinical Goal(s):  Patient will verbalize ability to afford treatment regimen through collaboration with PharmD and provider.   Interventions: 1:1 collaboration with [name], Elnita Maxwell, MD regarding development and update of comprehensive plan of care as evidenced by provider attestation and co-signature Inter-disciplinary care team collaboration (see longitudinal plan of care) Comprehensive medication review performed; medication list updated in electronic medical record  Hypertension (BP goal <130/80) BP Readings from Last 3 Encounters:  12/01/21 136/80  11/15/21 131/80  10/07/21 (!) 152/98  -Controlled -Current treatment: Metoprolol Tart 43m BID Appropriate, Effective, Safe, Accessible Lisinopril 240mAppropriate, Effective, Safe, Accessible -Medications previously tried: Lisinopril (DC  Feb 2023)  -Current home readings:  October 2023: States his BP yesterday was 180 over something 12/29/21: 115/80 when I asked him to test on the phone -Current dietary habits: "Tries to eat healthy" -Current exercise habits: loves to hike but is unable after recent hospitalizations -Denies hypotensive/hypertensive symptoms -Educated on BP goals and benefits of medications for prevention of heart attack, stroke and kidney damage; -Counseled to monitor BP at home 3x/week, document, and provide log at future appointments October 2023: Counseled patient to take/write BP daily for next week. CCM team will f/u then. Counseled him to call office if BP over 18614ystolic again  Hx PE (204315(Goal: Prevent recurrence) Wt Readings from Last 3 Encounters:  12/01/21 196 lb (88.9 kg)  11/15/21 194 lb 3.2 oz (88.1 kg)  09/12/21 182 lb 3.2 oz (82.6 kg)   Lab Results  Component Value Date   CREATININE 1.47 (H) 12/15/2021  -Controlled -Current treatment  Eliquis 82m382mID Appropriate, Effective, Safe, Accessible -Medications previously tried: N/A  -Recommended to continue current medication   Hyperlipidemia: (LDL goal < 70) The ASCVD Risk score (Arnett DK, et al., 2019) failed to calculate for the following reasons:   The valid total cholesterol range is 130 to 320 mg/dL Lab Results  Component Value Date   CHOL 73 (L) 12/01/2021   CHOL 101 07/04/2021   CHOL 99 (L) 02/21/2021   Lab Results  Component Value Date   HDL 37 (L) 12/01/2021   HDL 26 (L) 07/04/2021   HDL 30 (L) 02/21/2021   Lab Results  Component Value Date   LDLCALC 10 12/01/2021   LDLCALC 50 07/04/2021   LDLCALC 47 02/21/2021   Lab Results  Component Value Date   TRIG 156 (H) 12/01/2021   TRIG 143 07/04/2021   TRIG 121 02/21/2021   Lab Results  Component Value Date   CHOLHDL 2.0 12/01/2021   CHOLHDL  3.9 07/04/2021   CHOLHDL 3.3 02/21/2021  No results found for: "LDLDIRECT" -Controlled -Current  treatment: Rosuvastatin 66m Appropriate, Effective, Safe, Accessible Repatha 1437mAppropriate, Effective, Safe, Query accessible Fish Oil 100063mID Appropriate, Effective, Safe, Accessible Clopidogrel 83m23mpropriate, Effective, Safe, Accessible -Medications previously tried: N/A  -Current dietary patterns: "Tries to eat healthy" -Current exercise habits: loves to hike but is unable after recent hospitalizations -Educated on Cholesterol goals;  March 2023: Will try to get patient on Vascepa at future visit, don't want to overwhelm too much today May 2023: Unable to afford Repatha, start PAP ASAP October 2023: Looks like [facility] is sending Repathato patient for $0. Will ask CCM team what program he is in and how to renew for 2024 if needed  COPD (Goal: control symptoms and prevent exacerbations) Pulmonary Functions Testing Results:  No results found for: "FEV1", "FVC", "FEV1FVC", "TLC", "DLCO"    12/29/2021   11:11 AM 07/27/2021   10:55 AM  CAT Score  Total CAT Score 11 9  CBC    Component Value Date/Time   WBC 9.5 12/01/2021 0915   WBC 9.5 07/23/2021 0154   RBC 5.31 12/01/2021 0915   RBC 3.62 (L) 07/23/2021 0154   HGB 15.3 12/01/2021 0915   HCT 46.2 12/01/2021 0915   PLT 285 12/01/2021 0915   MCV 87 12/01/2021 0915   MCH 28.8 12/01/2021 0915   MCH 27.3 07/23/2021 0154   MCHC 33.1 12/01/2021 0915   MCHC 31.7 07/23/2021 0154   RDW 15.9 (H) 12/01/2021 0915   LYMPHSABS 3.8 (H) 12/01/2021 0915   MONOABS 1.6 (H) 07/18/2021 1807   EOSABS 0.1 12/01/2021 0915   BASOSABS 0.1 12/01/2021 0915  -Controlled -Current treatment  Albuterol PRN Appropriate, Effective, Safe, Accessible Trelegy Appropriate, Effective, Safe, Accessible -Medications previously tried: N/A  -Gold Grade: Unknown -Current COPD Classification:  Unknown -MMRC/CAT score: Unknown -Pulmonary function testing: Unknown -Exacerbations requiring treatment in last 6 months: None -Patient denies consistent use of  maintenance inhaler -Frequency of rescue inhaler use: PRN March 2023: Patient's main priority was operations of meds in general ([facility]). Unable to get CAT score or do full assessment. Will do at future visit and go more in-depth then. Spoke long enough to know patient is content on therapy May 2023: Conducted CAT score October 2023: CAT score slightly worse but patient states lungs feel normal to him -Looks like [facility] is sending Trelegy to patient for $0. Will ask CCM team what program he is in and how to renew for 2024 if needed  GERD (Goal: minimize symptoms of reflux ) -Controlled -Current treatment  Pantoprazole 40mg27mropriate, Effective, Safe, Accessible -Medications previously tried: none reported  -Triggering factors: N/A -Hx of Bleeds/ulcers: No -Counseled on small meals, elevating head, and sleeping on left side -Recommended to continue current medication   Patient Goals/Self-Care Activities Patient will:  - take medications as prescribed as evidenced by patient report and record review  Follow Up Plan: The patient has been provided with contact information for the care management team and has been advised to call with any health related questions or concerns.   CPP F/U April 2024  [place] ConstablermSherian Rein [phone]        Medication Assistance: None required.  Patient affirms current coverage meets needs.  Compliance/Adherence/Medication fill history:     [facility] [facility] [place] [place] [address] [place]: [phone]  Pharmacy [address] [place] [address] [place] [address] Phone: [phone] Fax: [phone]  Uses pill box? No -   Pt endorses 80% compliance  We discussed: Benefits of medication synchronization, packaging and delivery as well as enhanced pharmacist  oversight with [facility]. Patient decided to: Utilize [facility] pharmacy for medication synchronization, packaging and delivery -Verbal consent obtained for [facility] enhanced pharmacy services (medication synchronization, adherence packaging, delivery coordination). A medication sync plan was created to allow patient to get all medications delivered once every 30 to 90 days per patient preference. Patient understands they have freedom to choose pharmacy and clinical pharmacist will coordinate care between all prescribers and [facility].  Care Plan and Follow Up Patient Decision:  Patient agrees to Care Plan and Follow-up.  Plan: The patient has been provided with contact information for the care management team and has been advised to call with any health related questions or concerns.   CPP F/U April 2024  [name], Sherian Rein.D. - [phone]

## 2021-12-29 NOTE — Patient Instructions (Signed)
Visit Information   Goals Addressed   None    Patient Care Plan: CCM Pharmacy Care Plan     Problem Identified: Disease State Management   Priority: High  Onset Date: 05/19/2021     Long-Range Goal: Lipids, Cardio, GERD   Start Date: 05/19/2021  Expected End Date: 05/19/2022  Recent Progress: On track  Priority: High  Note:   Current Barriers:  Does not contact provider office for questions/concerns  Pharmacist Clinical Goal(s):  Patient will verbalize ability to afford treatment regimen through collaboration with PharmD and provider.   Interventions: 1:1 collaboration with [name], Elnita Maxwell, MD regarding development and update of comprehensive plan of care as evidenced by provider attestation and co-signature Inter-disciplinary care team collaboration (see longitudinal plan of care) Comprehensive medication review performed; medication list updated in electronic medical record  Hypertension (BP goal <130/80) BP Readings from Last 3 Encounters:  12/01/21 136/80  11/15/21 131/80  10/07/21 (!) 152/98  -Controlled -Current treatment: Metoprolol Tart '50mg'$  BID Appropriate, Effective, Safe, Accessible Lisinopril '20mg'$  Appropriate, Effective, Safe, Accessible -Medications previously tried: Lisinopril (DC Feb 2023)  -Current home readings:  October 2023: States his BP yesterday was 180 over something 12/29/21: 115/80 when I asked him to test on the phone -Current dietary habits: "Tries to eat healthy" -Current exercise habits: loves to hike but is unable after recent hospitalizations -Denies hypotensive/hypertensive symptoms -Educated on BP goals and benefits of medications for prevention of heart attack, stroke and kidney damage; -Counseled to monitor BP at home 3x/week, document, and provide log at future appointments October 2023: Counseled patient to take/write BP daily for next week. CCM team will f/u then. Counseled him to call office if BP over 960 systolic again  Hx PE (4540)  (Goal: Prevent recurrence) Wt Readings from Last 3 Encounters:  12/01/21 196 lb (88.9 kg)  11/15/21 194 lb 3.2 oz (88.1 kg)  09/12/21 182 lb 3.2 oz (82.6 kg)   Lab Results  Component Value Date   CREATININE 1.47 (H) 12/15/2021  -Controlled -Current treatment  Eliquis '5mg'$  BID Appropriate, Effective, Safe, Accessible -Medications previously tried: N/A  -Recommended to continue current medication   Hyperlipidemia: (LDL goal < 70) The ASCVD Risk score (Arnett DK, et al., 2019) failed to calculate for the following reasons:   The valid total cholesterol range is 130 to 320 mg/dL Lab Results  Component Value Date   CHOL 73 (L) 12/01/2021   CHOL 101 07/04/2021   CHOL 99 (L) 02/21/2021   Lab Results  Component Value Date   HDL 37 (L) 12/01/2021   HDL 26 (L) 07/04/2021   HDL 30 (L) 02/21/2021   Lab Results  Component Value Date   LDLCALC 10 12/01/2021   LDLCALC 50 07/04/2021   LDLCALC 47 02/21/2021   Lab Results  Component Value Date   TRIG 156 (H) 12/01/2021   TRIG 143 07/04/2021   TRIG 121 02/21/2021   Lab Results  Component Value Date   CHOLHDL 2.0 12/01/2021   CHOLHDL 3.9 07/04/2021   CHOLHDL 3.3 02/21/2021  No results found for: "LDLDIRECT" -Controlled -Current treatment: Rosuvastatin '40mg'$  Appropriate, Effective, Safe, Accessible Repatha '140mg'$  Appropriate, Effective, Safe, Query accessible Fish Oil '1000mg'$  BID Appropriate, Effective, Safe, Accessible Clopidogrel '75mg'$  Appropriate, Effective, Safe, Accessible -Medications previously tried: N/A  -Current dietary patterns: "Tries to eat healthy" -Current exercise habits: loves to hike but is unable after recent hospitalizations -Educated on Cholesterol goals;  March 2023: Will try to get patient on Vascepa at future visit, don't want to overwhelm  too much today May 2023: Unable to afford Repatha, start PAP ASAP October 2023: Looks like [facility] is sending Repathato patient for $0. Will ask CCM team what program he  is in and how to renew for 2024 if needed  COPD (Goal: control symptoms and prevent exacerbations) Pulmonary Functions Testing Results:  No results found for: "FEV1", "FVC", "FEV1FVC", "TLC", "DLCO"    12/29/2021   11:11 AM 07/27/2021   10:55 AM  CAT Score  Total CAT Score 11 9  CBC    Component Value Date/Time   WBC 9.5 12/01/2021 0915   WBC 9.5 07/23/2021 0154   RBC 5.31 12/01/2021 0915   RBC 3.62 (L) 07/23/2021 0154   HGB 15.3 12/01/2021 0915   HCT 46.2 12/01/2021 0915   PLT 285 12/01/2021 0915   MCV 87 12/01/2021 0915   MCH 28.8 12/01/2021 0915   MCH 27.3 07/23/2021 0154   MCHC 33.1 12/01/2021 0915   MCHC 31.7 07/23/2021 0154   RDW 15.9 (H) 12/01/2021 0915   LYMPHSABS 3.8 (H) 12/01/2021 0915   MONOABS 1.6 (H) 07/18/2021 1807   EOSABS 0.1 12/01/2021 0915   BASOSABS 0.1 12/01/2021 0915  -Controlled -Current treatment  Albuterol PRN Appropriate, Effective, Safe, Accessible Trelegy Appropriate, Effective, Safe, Accessible -Medications previously tried: N/A  -Gold Grade: Unknown -Current COPD Classification:  Unknown -MMRC/CAT score: Unknown -Pulmonary function testing: Unknown -Exacerbations requiring treatment in last 6 months: None -Patient denies consistent use of maintenance inhaler -Frequency of rescue inhaler use: PRN March 2023: Patient's main priority was operations of meds in general ([facility]). Unable to get CAT score or do full assessment. Will do at future visit and go more in-depth then. Spoke long enough to know patient is content on therapy May 2023: Conducted CAT score October 2023: CAT score slightly worse but patient states lungs feel normal to him -Looks like [facility] is sending Trelegy to patient for $0. Will ask CCM team what program he is in and how to renew for 2024 if needed  GERD (Goal: minimize symptoms of reflux ) -Controlled -Current treatment  Pantoprazole '40mg'$  Appropriate, Effective, Safe, Accessible -Medications previously tried: none  reported  -Triggering factors: N/A -Hx of Bleeds/ulcers: No -Counseled on small meals, elevating head, and sleeping on left side -Recommended to continue current medication   Patient Goals/Self-Care Activities Patient will:  - take medications as prescribed as evidenced by patient report and record review  Follow Up Plan: The patient has been provided with contact information for the care management team and has been advised to call with any health related questions or concerns.   CPP F/U April 2024  [name], Sherian Rein.D. - [phone]       Mr. Schimming was given information about Chronic Care Management services today including:  CCM service includes personalized support from designated clinical staff supervised by his physician, including individualized plan of care and coordination with other care providers 24/7 contact phone numbers for assistance for urgent and routine care needs. Standard insurance, coinsurance, copays and deductibles apply for chronic care management only during months in which we provide at least 20 minutes of these services. Most insurances cover these services at 100%, however patients may be responsible for any copay, coinsurance and/or deductible if applicable. This service may help you avoid the need for more expensive face-to-face services. Only one practitioner may furnish and bill the service in a calendar month. The patient may stop CCM services at any time (effective at the end of the month) by phone call to  the office staff.  Patient agreed to services and verbal consent obtained.   The patient verbalized understanding of instructions, educational materials, and care plan provided today and DECLINED offer to receive copy of patient instructions, educational materials, and care plan.  The pharmacy team will reach out to the patient again over the next 60 days.   Lane Hacker, [place]

## 2022-01-04 ENCOUNTER — Telehealth: Payer: Self-pay

## 2022-01-04 NOTE — Chronic Care Management (AMB) (Signed)
    Chronic Care Management Pharmacy Assistant   Name: Renn Dirocco  MRN: [id number] DOB: 12/03/1965  Reason for Encounter: BP readings   01-04-2022: 10-25 124/81 74, 10-24 115/80 59, 10-23 103/66 75, 10-22 137/99 97, 10-21 137/98 100. 6 month appointment scheduled for April with [name].  Medications: Outpatient Encounter Medications as of 01/04/2022  Medication Sig   albuterol (VENTOLIN HFA) 108 (90 Base) MCG/ACT inhaler Inhale 2 puffs into the lungs every 6 (six) hours as needed for wheezing or shortness of breath.   apixaban (ELIQUIS) 5 MG TABS tablet TAKE ONE TABLET BY MOUTH EVERY MORNING and TAKE ONE TABLET BY MOUTH EVERY EVENING   clopidogrel (PLAVIX) 75 MG tablet Take 1 tablet (75 mg total) by mouth daily.   cyanocobalamin (VITAMIN B12) 1000 MCG tablet TAKE ONE TABLET BY MOUTH EVERY MORNING   Fluticasone-Umeclidin-Vilant (TRELEGY ELLIPTA) 100-62.5-25 MCG/ACT AEPB Inhale 1 puff into the lungs daily.   lisinopril (ZESTRIL) 20 MG tablet Take 1 tablet (20 mg total) by mouth daily.   metoprolol tartrate (LOPRESSOR) 50 MG tablet Take 1 tablet (50 mg total) by mouth 2 (two) times daily.   Multiple Vitamin (MULTIVITAMIN WITH MINERALS) TABS tablet Take 1 tablet by mouth daily.   Omega-3 Fatty Acids (FISH OIL) 1000 MG CAPS Take 1 capsule (1,000 mg total) by mouth 2 (two) times daily.   ondansetron (ZOFRAN) 4 MG tablet Take 1 tablet (4 mg total) by mouth every 8 (eight) hours as needed for nausea or vomiting.   pantoprazole (PROTONIX) 40 MG tablet TAKE ONE TABLET BY MOUTH EVERY MORNING and TAKE ONE TABLET BY MOUTH EVERY EVENING Strength: 40 mg   potassium chloride SA (KLOR-CON M) 20 MEQ tablet Take 1 tablet (20 mEq total) by mouth daily.   REPATHA SURECLICK 779 MG/ML SOAJ Inject 140 mg into the skin every 14 (fourteen) days.   rosuvastatin (CRESTOR) 40 MG tablet Take 1 tablet (40 mg total) by mouth at bedtime.   No facility-administered encounter medications on file as of  01/04/2022.     [place] Pharmacist Assistant [phone]

## 2022-01-10 ENCOUNTER — Other Ambulatory Visit: Payer: Self-pay | Admitting: Family Medicine

## 2022-01-10 DIAGNOSIS — E785 Hyperlipidemia, unspecified: Secondary | ICD-10-CM

## 2022-01-10 DIAGNOSIS — I10 Essential (primary) hypertension: Secondary | ICD-10-CM

## 2022-01-10 DIAGNOSIS — J449 Chronic obstructive pulmonary disease, unspecified: Secondary | ICD-10-CM | POA: Diagnosis not present

## 2022-01-12 ENCOUNTER — Telehealth: Payer: Self-pay | Admitting: Family Medicine

## 2022-01-12 NOTE — Chronic Care Management (AMB) (Signed)
Chronic Care Management Pharmacy Assistant   Name: Scott Foley  MRN: [id number] DOB: 02-23-1966   Reason for Encounter: Medication Coordination for [facility]/HTN Assessment   Recent office visits:  None  Recent consult visits:  None  Hospital visits:  None  Medications: Outpatient Encounter Medications as of 01/12/2022  Medication Sig   albuterol (VENTOLIN HFA) 108 (90 Base) MCG/ACT inhaler Inhale 2 puffs into the lungs every 6 (six) hours as needed for wheezing or shortness of breath.   apixaban (ELIQUIS) 5 MG TABS tablet TAKE ONE TABLET BY MOUTH EVERY MORNING and TAKE ONE TABLET BY MOUTH EVERY EVENING   clopidogrel (PLAVIX) 75 MG tablet Take 1 tablet (75 mg total) by mouth daily.   cyanocobalamin (VITAMIN B12) 1000 MCG tablet TAKE ONE TABLET BY MOUTH EVERY MORNING   Fluticasone-Umeclidin-Vilant (TRELEGY ELLIPTA) 100-62.5-25 MCG/ACT AEPB Inhale 1 puff into the lungs daily.   lisinopril (ZESTRIL) 20 MG tablet Take 1 tablet (20 mg total) by mouth daily.   metoprolol tartrate (LOPRESSOR) 50 MG tablet Take 1 tablet (50 mg total) by mouth 2 (two) times daily.   Multiple Vitamin (MULTIVITAMIN WITH MINERALS) TABS tablet Take 1 tablet by mouth daily.   Omega-3 Fatty Acids (FISH OIL) 1000 MG CAPS Take 1 capsule (1,000 mg total) by mouth 2 (two) times daily.   ondansetron (ZOFRAN) 4 MG tablet Take 1 tablet (4 mg total) by mouth every 8 (eight) hours as needed for nausea or vomiting.   pantoprazole (PROTONIX) 40 MG tablet TAKE ONE TABLET BY MOUTH EVERY MORNING and TAKE ONE TABLET BY MOUTH EVERY EVENING Strength: 40 mg   potassium chloride SA (KLOR-CON M) 20 MEQ tablet TAKE ONE TABLET BY MOUTH ONCE DAILY   REPATHA SURECLICK 811 MG/ML SOAJ Inject 140 mg into the skin every 14 (fourteen) days.   rosuvastatin (CRESTOR) 40 MG tablet Take 1 tablet (40 mg total) by mouth at bedtime.   No facility-administered encounter medications on file as of 01/12/2022.    Reviewed chart for  medication changes ahead of medication coordination call.  No OVs, Consults, or hospital visits since last care coordination call/Pharmacist visit.   No medication changes indicated OR if recent visit, treatment plan here.  BP Readings from Last 3 Encounters:  12/01/21 136/80  11/15/21 131/80  10/07/21 (!) 152/98    No results found for: "HGBA1C"   Patient obtains medications through Adherence Packaging  30 Days   Last adherence delivery included:  Eliquis '5mg'$  1 at B and 1 EM Clopidogrel '75mg'$  1 B Vitamin B12 1046mg 1 B Trelegy Inhaler 1062m 1 puff once daily  Metoprolol Tar '50mg'$  1 B and 1 EM Mens Multi 1 at EM Fish Oil '1000mg'$  1 B and 1 EM Pantoprazole '40mg'$  1 B and 1 EM Rosuvastatin '40mg'$  1 EM Lisinopril '20mg'$  1 at B Potassium Chloride 2047m 1 in B Repatha Sureclick '140mg'$ - Inject '140mg'$  every 14 days  Albuterol 108m32mnhaler- Inhale 2 puffs into the lungs every 6 (six) hours as needed   Patient declined (meds) last month  None  Patient is due for next adherence delivery on: 01/24/22. Called patient and reviewed medications and coordinated delivery.  This delivery to include: Eliquis '5mg'$  1 at B and 1 EM Clopidogrel '75mg'$  1 B Vitamin B12 1000mc15mB Trelegy Inhaler 100mcg20muff once daily  Metoprolol Tar '50mg'$  1 B and 1 EM Mens Multi 1 at EM Fish Oil '1000mg'$  1 B and 1 EM Pantoprazole '40mg'$  1 B and 1 EM Rosuvastatin  $'40mg'J$  1 EM Lisinopril '20mg'$  1 at B Potassium Chloride 63mq- 1 in B Repatha Sureclick '140mg'$ - Inject '140mg'$  every 14 days  Albuterol 1068m inhaler- Inhale 2 puffs into the lungs every 6 (six) hours as needed   Patient declined the following medications  None  Patient needs refills-Request Sent  Lisinopril '20mg'$    Recent BP readings: Pt stated he did not have any ones from today or the last few days. He reported that they were already reported as follows.  01-04-2022: 10-25 124/81 74, 10-24 115/80 59, 10-23 103/66 75, 10-22 137/99 97, 10-21 137/98 100.    Confirmed taking all his BP medications and has not had any high readings of 180/120.    Confirmed delivery date of 01/24/22, advised patient that pharmacy will contact them the morning of delivery.    DaElray McgregorCMHillsboroharmacist Assistant  [phone]

## 2022-01-13 ENCOUNTER — Other Ambulatory Visit: Payer: Self-pay

## 2022-01-13 MED ORDER — LISINOPRIL 20 MG PO TABS: 20.0000 mg | ORAL_TABLET | Freq: Every day | ORAL | 0 refills | Status: DC

## 2022-01-21 ENCOUNTER — Other Ambulatory Visit: Payer: Self-pay | Admitting: Family Medicine

## 2022-01-21 DIAGNOSIS — I10 Essential (primary) hypertension: Secondary | ICD-10-CM

## 2022-01-21 DIAGNOSIS — I119 Hypertensive heart disease without heart failure: Secondary | ICD-10-CM

## 2022-01-21 DIAGNOSIS — E782 Mixed hyperlipidemia: Secondary | ICD-10-CM

## 2022-01-21 DIAGNOSIS — J449 Chronic obstructive pulmonary disease, unspecified: Secondary | ICD-10-CM

## 2022-02-02 ENCOUNTER — Other Ambulatory Visit: Payer: Self-pay | Admitting: Family Medicine

## 2022-02-02 DIAGNOSIS — Z9981 Dependence on supplemental oxygen: Secondary | ICD-10-CM

## 2022-02-02 DIAGNOSIS — J9611 Chronic respiratory failure with hypoxia: Secondary | ICD-10-CM

## 2022-02-02 DIAGNOSIS — J449 Chronic obstructive pulmonary disease, unspecified: Secondary | ICD-10-CM

## 2022-02-09 ENCOUNTER — Telehealth: Payer: Self-pay

## 2022-02-09 NOTE — Progress Notes (Signed)
  Chronic Care Management   Note  02/09/2022 Name: [name] MRN: [id number] DOB: [birth year]  Scott Foley is a 56 y.o. year old male who is a primary care patient of [name], [name], MD. I reached out to Elvera Maria by phone today in response to a referral sent by Mr. Dontavian Marchi Brunke's PCP.  Mr. Napoleon Monacelli  agreedto scheduling an appointment with the CCM RN Case Manager   Follow up plan: Patient agreed to scheduled appointment with RN Case Manager on 02/24/2022(date/time).   Noreene Larsson, [place], [place] [id number] Direct Dial: [phone] [email or web address]

## 2022-02-10 ENCOUNTER — Telehealth: Payer: Self-pay

## 2022-02-10 NOTE — Progress Notes (Signed)
Chronic Care Management Pharmacy Assistant   Name: Scott Foley  MRN: [id number] DOB: April 21, 1965   Reason for Encounter: Medication Coordination for [facility]    Recent office visits:  None  Recent consult visits:  01/16/22 [facility]) Bruni, [place]. Seen for follow up. No med changes.  Hospital visits:  None  Medications: Outpatient Encounter Medications as of 02/10/2022  Medication Sig   albuterol (VENTOLIN HFA) 108 (90 Base) MCG/ACT inhaler INHALE TWO PUFFS BY MOUTH INTO LUNGS every SIX hours AS NEEDED FOR WHEEZING AND/OR SHORTNESS OF BREATH   apixaban (ELIQUIS) 5 MG TABS tablet TAKE ONE TABLET BY MOUTH EVERY MORNING and TAKE ONE TABLET BY MOUTH EVERY EVENING   clopidogrel (PLAVIX) 75 MG tablet Take 1 tablet (75 mg total) by mouth daily.   cyanocobalamin (VITAMIN B12) 1000 MCG tablet TAKE ONE TABLET BY MOUTH EVERY MORNING   Fluticasone-Umeclidin-Vilant (TRELEGY ELLIPTA) 100-62.5-25 MCG/ACT AEPB Inhale 1 puff into the lungs daily.   lisinopril (ZESTRIL) 20 MG tablet Take 1 tablet (20 mg total) by mouth daily.   metoprolol tartrate (LOPRESSOR) 50 MG tablet Take 1 tablet (50 mg total) by mouth 2 (two) times daily.   Multiple Vitamin (MULTIVITAMIN WITH MINERALS) TABS tablet Take 1 tablet by mouth daily.   Omega-3 Fatty Acids (FISH OIL) 1000 MG CAPS Take 1 capsule (1,000 mg total) by mouth 2 (two) times daily.   ondansetron (ZOFRAN) 4 MG tablet Take 1 tablet (4 mg total) by mouth every 8 (eight) hours as needed for nausea or vomiting.   pantoprazole (PROTONIX) 40 MG tablet TAKE ONE TABLET BY MOUTH EVERY MORNING and TAKE ONE TABLET BY MOUTH EVERY EVENING Strength: 40 mg   potassium chloride SA (KLOR-CON M) 20 MEQ tablet TAKE ONE TABLET BY MOUTH ONCE DAILY   REPATHA SURECLICK 469 MG/ML SOAJ Inject 140 mg into the skin every 14 (fourteen) days.   rosuvastatin (CRESTOR) 40 MG tablet Take 1 tablet (40 mg total) by mouth at bedtime.   No  facility-administered encounter medications on file as of 02/10/2022.    Reviewed chart for medication changes ahead of medication coordination call.  No OVs, hospital visits since last care coordination call/Pharmacist visit.   No medication changes indicated OR if recent visit, treatment plan here.  BP Readings from Last 3 Encounters:  12/01/21 136/80  11/15/21 131/80  10/07/21 (!) 152/98    No results found for: "HGBA1C"   Patient obtains medications through Adherence Packaging  30 Days   Last adherence delivery included:  Eliquis '5mg'$  1 at B and 1 EM Clopidogrel '75mg'$  1 B Vitamin B12 1056mg 1 B Trelegy Inhaler 1054m 1 puff once daily  Metoprolol Tar '50mg'$  1 B and 1 EM Mens Multi 1 at EM Fish Oil '1000mg'$  1 B and 1 EM Pantoprazole '40mg'$  1 B and 1 EM Rosuvastatin '40mg'$  1 EM Lisinopril '20mg'$  1 at B Potassium Chloride 2054m 1 in B Repatha Sureclick '140mg'$ - Inject '140mg'$  every 14 days  Albuterol 108m12mnhaler- Inhale 2 puffs into the lungs every 6 (six) hours as needed   Patient declined (meds) last month  None  Patient is due for next adherence delivery on: 02/22/22. Called patient and reviewed medications and coordinated delivery.  This delivery to include: Eliquis '5mg'$  1 at B and 1 EM Clopidogrel '75mg'$  1 B Vitamin B12 1000mc67mB Trelegy Inhaler 100mcg61muff once daily  Metoprolol Tar '50mg'$  1 B and 1 EM Mens Multi 1 at EM Fish Oil '1000mg'$  1 B  and 1 EM Pantoprazole '40mg'$  1 B and 1 EM Rosuvastatin '40mg'$  1 EM Lisinopril '20mg'$  1 at B Potassium Chloride 82mq- 1 in B Repatha Sureclick '140mg'$ - Inject '140mg'$  every 14 days  Albuterol 1027m inhaler- Inhale 2 puffs into the lungs every 6 (six) hours as needed   Patient declined the following medications  None  Patient needs refills  None  Confirmed delivery date of 02/22/22, advised patient that pharmacy will contact them the morning of delivery.   DaElray McgregorCMPalmerharmacist Assistant  [phone]

## 2022-02-24 ENCOUNTER — Telehealth: Payer: Medicare HMO

## 2022-02-24 ENCOUNTER — Ambulatory Visit (INDEPENDENT_AMBULATORY_CARE_PROVIDER_SITE_OTHER): Payer: Medicare HMO

## 2022-02-24 DIAGNOSIS — I10 Essential (primary) hypertension: Secondary | ICD-10-CM

## 2022-02-24 DIAGNOSIS — J449 Chronic obstructive pulmonary disease, unspecified: Secondary | ICD-10-CM

## 2022-02-24 NOTE — Plan of Care (Signed)
Chronic Care Management Provider Comprehensive Care Plan    02/24/2022 Name: Scott Foley MRN: [id number] DOB: [birth year]  Referral to Chronic Care Management (CCM) services was placed by Provider:  Dr. [name] on Date: 01-21-2022.  Chronic Condition 1: COPD Provider Assessment and Plan   Continue  Trelegy 1 puff daily, Albuterol 108 mcg inhaler 2 puff every 6 hours PRN.       Expected Outcome/Goals Addressed This Visit (Provider CCM goals/Provider Assessment and plan   Goal: CCM (COPD) EXPECTED OUTCOME: MONITOR, SELF-MANAGE AND REDUCE SYMPTOMS OF COPD   Symptom Management Condition 1: Take all medications as prescribed Attend all scheduled provider appointments Call provider office for new concerns or questions  call the Suicide and Crisis Lifeline: 988 call the [place] National Suicide Prevention Lifeline: (505)111-3375 or TTY: 646-385-4283 TTY 719-387-6869) to talk to a trained counselor call 1-800-273-TALK (toll free, 24 hour hotline) if experiencing a Mental Health or [place]  eliminate smoking in my home identify and avoid work-related triggers identify and remove indoor air pollutants limit outdoor activity during cold weather listen for public air quality announcements every day do breathing exercises every day develop a rescue plan eliminate symptom triggers at home follow rescue plan if symptoms flare-up  Chronic Condition 2: HTN Provider Assessment and Plan : Patient is taking Lisinopril 20 mg daily, Metoprolol 50 mg twice a day.    Expected Outcome/Goals Addressed This Visit (Provider CCM goals/Provider Assessment and plan   Goal: CCM (HYPERTENSION)  EXPECTED OUTCOME:  MONITOR,SELF- MANAGE AND REDUCE SYMPTOMS OF HYPERTENSION   Symptom Management Condition 2: Take all medications as prescribed Call pharmacy for medication refills 3-7 days in advance of running out of medications Call provider office for new concerns or questions   call the Suicide and Crisis Lifeline: 988 call the [place] National Suicide Prevention Lifeline: (986)806-1998 or TTY: 234 354 3716 TTY (530)684-3187) to talk to a trained counselor call 1-800-273-TALK (toll free, 24 hour hotline) if experiencing a Mental Health or [place]  check blood pressure weekly learn about high blood pressure call doctor for signs and symptoms of high blood pressure keep all doctor appointments take medications for blood pressure exactly as prescribed report new symptoms to your doctor  Chronic Condition 3: HLD Provider Assessment and Plan Well controlled.  No changes to medicines. Continue Rosuvastatin 40 mg daily at bedtime, Repatha 140 mg every 14 days, Fish Oil 1000 mg twice a day. Continue to work on eating a healthy diet and exercise.  Labs drawn today.      Expected Outcome/Goals Addressed This Visit (Provider CCM goals/Provider Assessment and plan   Goal: CCM (HLD)  EXPECTED OUTCOME:  MONITOR, SELF- MANAGE AND REDUCE SYMPTOMS OF HLD   Symptom Management Condition 3: Take all medications as prescribed Attend all scheduled provider appointments Call provider office for new concerns or questions  call the Suicide and Crisis Lifeline: 988 call the [place] National Suicide Prevention Lifeline: 872-275-1797 or TTY: (757) 648-5513 TTY [phone]) to talk to a trained counselor call 1-800-273-TALK (toll free, 24 hour hotline) if experiencing a Mental Health or [place]  call for medicine refill 2 or 3 days before it runs out take all medications exactly as prescribed call doctor with any symptoms you believe are related to your medicine call doctor when you experience any new symptoms go to all doctor appointments as scheduled adhere to prescribed diet: heart healthy  Problem List Patient Active Problem List   Diagnosis Date Noted   Intra-abdominal  fluid collection 07/18/2021   B12 deficiency 07/04/2021   CKD (chronic  kidney disease), stage II 07/04/2021   Medication monitoring encounter 06/24/2021   History of blood clotting disorder 06/11/2021   Hypomagnesemia 06/11/2021   VRE (vancomycin-resistant Enterococci) infection 05/11/2021   Pericardial effusion 04/29/2021   Normocytic anemia 04/15/2021   Hypoglycemia 04/15/2021   Protein-calorie malnutrition, severe 04/15/2021   Hypokalemia 04/13/2021   History of pulmonary embolism 02/15/2021   Coronary artery disease due to lipid rich plaque 01/01/2021   Hypertensive heart disease without congestive heart failure 12/06/2020   Acquired thrombophilia ([place]) 12/06/2020   Morbid obesity ([place]) 12/06/2020   Colon polyp 10/25/2020   Diverticulosis of colon 10/25/2020   [place], post-stroke 07/28/2019   DNR (do not resuscitate) 03/03/2019   History of CVA (cerebrovascular accident) 07/16/2018   Chronic obstructive pulmonary disease ([place]) 07/16/2018   Pulmonary nodules/lesions, multiple 11/25/2012   Coronary artery disease with stable angina pectoris (HCC)    Hyperlipemia     Medication Management  Current Outpatient Medications:    albuterol (VENTOLIN HFA) 108 (90 Base) MCG/ACT inhaler, INHALE TWO PUFFS BY MOUTH INTO LUNGS every SIX hours AS NEEDED FOR WHEEZING AND/OR SHORTNESS OF BREATH, Disp: 8.5 g, Rfl: 2   apixaban (ELIQUIS) 5 MG TABS tablet, TAKE ONE TABLET BY MOUTH EVERY MORNING and TAKE ONE TABLET BY MOUTH EVERY EVENING, Disp: 180 tablet, Rfl: 1   clopidogrel (PLAVIX) 75 MG tablet, Take 1 tablet (75 mg total) by mouth daily., Disp: 90 tablet, Rfl: 1   cyanocobalamin (VITAMIN B12) 1000 MCG tablet, TAKE ONE TABLET BY MOUTH EVERY MORNING, Disp: 30 tablet, Rfl: 1   Fluticasone-Umeclidin-Vilant (TRELEGY ELLIPTA) 100-62.5-25 MCG/ACT AEPB, Inhale 1 puff into the lungs daily., Disp: 1 each, Rfl: 11   lisinopril (ZESTRIL) 20 MG tablet, Take 1 tablet (20 mg total) by mouth daily., Disp: 90 tablet, Rfl: 0   metoprolol tartrate (LOPRESSOR) 50 MG tablet, Take 1  tablet (50 mg total) by mouth 2 (two) times daily., Disp: 180 tablet, Rfl: 0   Multiple Vitamin (MULTIVITAMIN WITH MINERALS) TABS tablet, Take 1 tablet by mouth daily., Disp: , Rfl:    Omega-3 Fatty Acids (FISH OIL) 1000 MG CAPS, Take 1 capsule (1,000 mg total) by mouth 2 (two) times daily., Disp: 180 capsule, Rfl: 0   ondansetron (ZOFRAN) 4 MG tablet, Take 1 tablet (4 mg total) by mouth every 8 (eight) hours as needed for nausea or vomiting., Disp: 20 tablet, Rfl: 0   pantoprazole (PROTONIX) 40 MG tablet, TAKE ONE TABLET BY MOUTH EVERY MORNING and TAKE ONE TABLET BY MOUTH EVERY EVENING Strength: 40 mg, Disp: 180 tablet, Rfl: 1   potassium chloride SA (KLOR-CON M) 20 MEQ tablet, TAKE ONE TABLET BY MOUTH ONCE DAILY, Disp: 30 tablet, Rfl: 3   REPATHA SURECLICK 585 MG/ML SOAJ, Inject 140 mg into the skin every 14 (fourteen) days., Disp: , Rfl:    rosuvastatin (CRESTOR) 40 MG tablet, Take 1 tablet (40 mg total) by mouth at bedtime., Disp: 90 tablet, Rfl: 0  Cognitive Assessment Identity Confirmed: : Name; DOB Cognitive Status: Normal   Functional Assessment Hearing Difficulty or Deaf: no Wear Glasses or Blind: yes Vision Management: wears glasses Concentrating, Remembering or Making Decisions Difficulty (CP): no Difficulty Communicating: no Difficulty Eating/Swallowing: no Walking or Climbing Stairs Difficulty: no (sometimes uses a cane when ambulating) Dressing/Bathing Difficulty: no Doing Errands Independently Difficulty (such as shopping) (CP): no Change in Functional Status Since Onset of Current Illness/Injury: no   Caregiver Assessment  Primary  Source of Support/Comfort: spouse Name of Support/Comfort Primary Source: Loukas Antonson- wife People in Home: child(ren), adult; spouse Name(s) of People in Home: Dawn- wife and 8 year old daughter Family Caregiver if Needed: none Primary Roles/Responsibilities: disabled   Planned Interventions  Provider established cholesterol goals  reviewed; Counseled on importance of regular laboratory monitoring as prescribed; Provided HLD educational materials; Reviewed role and benefits of statin for ASCVD risk reduction; Discussed strategies to manage statin-induced myalgias; Reviewed importance of limiting foods high in cholesterol; Reviewed exercise goals and target of 150 minutes per week; Screening for signs and symptoms of depression related to chronic disease state;  Assessed social determinant of health barriers;  The patient states he is feeling more tired than usual. Discussed staying  hydrated, trying some protein shakes, and eating more fruits and vegetables. Education and support given.  Evaluation of current treatment plan related to hypertension self management and patient's adherence to plan as established by provider;   Provided education to patient re: stroke prevention, s/s of heart attack and stroke; Reviewed prescribed diet heart healthy diet Reviewed medications with patient and discussed importance of compliance. The patient is compliant with medications, works with pharm D;  Discussed plans with patient for ongoing care management follow up and provided patient with direct contact information for care management team; Advised patient, providing education and rationale, to monitor blood pressure daily and record, calling PCP for findings outside established parameters;  Reviewed scheduled/upcoming provider appointments including: 04-05-2022 at 9 am Advised patient to discuss changes in heart health, questions or concerns  with provider; Provided education on prescribed diet heart healthy diet ;  Discussed complications of poorly controlled blood pressure such as heart disease, stroke, circulatory complications, vision complications, kidney impairment, sexual dysfunction;  Screening for signs and symptoms of depression related to chronic disease state;  Assessed social determinant of health barriers;  Provided  patient with basic written and verbal COPD education on self care/management/and exacerbation prevention Advised patient to track and manage COPD triggers Provided written and verbal instructions on pursed lip breathing and utilized returned demonstration as teach back Provided instruction about proper use of medications used for management of COPD including inhalers Advised patient to self assesses COPD action plan zone and make appointment with provider if in the yellow zone for 48 hours without improvement Advised patient to engage in light exercise as tolerated 3-5 days a week to aid in the the management of COPD Provided education about and advised patient to utilize infection prevention strategies to reduce risk of respiratory infection Discussed the importance of adequate rest and management of fatigue with COPD Screening for signs and symptoms of depression related to chronic disease state  Assessed social determinant of health barriers  Interaction and coordination with outside resources, practitioners, and providers See CCM Referral  Care Plan: Available in MyChart

## 2022-02-24 NOTE — Chronic Care Management (AMB) (Signed)
Chronic Care Management   CCM RN Visit Note  02/24/2022 Name: Scott Foley MRN: [id number] DOB: 1965-06-17  Subjective: Scott Foley is a 56 y.o. year old male who is a primary care patient of [name], [name], MD. The patient was referred to the Chronic Care Management team for assistance with care management needs subsequent to provider initiation of CCM services and plan of care.    Today's Visit:  Engaged with patient by telephone for initial visit.     SDOH Interventions Today    Flowsheet Row Most Recent Value  SDOH Interventions   Food Insecurity Interventions Intervention Not Indicated  Housing Interventions Intervention Not Indicated  Transportation Interventions Intervention Not Indicated  Utilities Interventions Intervention Not Indicated  Alcohol Usage Interventions Intervention Not Indicated (Score <7)  Financial Strain Interventions Other (Comment)  [works with pharm D for PAP]  Physical Activity Interventions Intervention Not Indicated  Stress Interventions Intervention Not Indicated  Social Connections Interventions Other (Comment)  [has good support system]         Goals Addressed             This Visit's Progress    CCM Expected Outcome:  Monitor, Self-Manage and Reduce Symptoms of:HLD       Current Barriers:  Care Coordination needs related to medication needs and ongoing support and education from the pharm D for management of medications in a patient with HLD Chronic Disease Management support and education needs related to effective management of HLD  Planned Interventions: Provider established cholesterol goals reviewed; Counseled on importance of regular laboratory monitoring as prescribed; Provided HLD educational materials; Reviewed role and benefits of statin for ASCVD risk reduction; Discussed strategies to manage statin-induced myalgias; Reviewed importance of limiting foods high in cholesterol; Reviewed exercise goals and  target of 150 minutes per week; Screening for signs and symptoms of depression related to chronic disease state;  Assessed social determinant of health barriers;  The patient states he is feeling more tired than usual. Discussed staying  hydrated, trying some protein shakes, and eating more fruits and vegetables. Education and support given.   Symptom Management: Take medications as prescribed   Attend all scheduled provider appointments Call provider office for new concerns or questions  call the Suicide and Crisis Lifeline: 988 call the [place] National Suicide Prevention Lifeline: (252)154-4435 or TTY: 463-269-0789 TTY 262-100-3133) to talk to a trained counselor call 1-800-273-TALK (toll free, 24 hour hotline) if experiencing a Mental Health or [place]  - call for medicine refill 2 or 3 days before it runs out - take all medications exactly as prescribed - call doctor with any symptoms you believe are related to your medicine - call doctor when you experience any new symptoms - go to all doctor appointments as scheduled - adhere to prescribed diet: heart healthy  Follow Up Plan: Telephone follow up appointment with care management team member scheduled for: 04-21-2022 at 145 pm       CCM Expected Outcome:  Monitor, Self-Manage, and Reduce Symptoms of Hypertension       Current Barriers:  Chronic Disease Management support and education needs related to effective management of HTN  Planned Interventions: Evaluation of current treatment plan related to hypertension self management and patient's adherence to plan as established by provider;   Provided education to patient re: stroke prevention, s/s of heart attack and stroke; Reviewed prescribed diet heart healthy diet Reviewed medications with patient and discussed importance of compliance. The patient is compliant with  medications, works with pharm D;  Discussed plans with patient for ongoing care management follow up  and provided patient with direct contact information for care management team; Advised patient, providing education and rationale, to monitor blood pressure daily and record, calling PCP for findings outside established parameters;  Reviewed scheduled/upcoming provider appointments including: 04-05-2022 at 9 am Advised patient to discuss changes in heart health, questions or concerns  with provider; Provided education on prescribed diet heart healthy diet ;  Discussed complications of poorly controlled blood pressure such as heart disease, stroke, circulatory complications, vision complications, kidney impairment, sexual dysfunction;  Screening for signs and symptoms of depression related to chronic disease state;  Assessed social determinant of health barriers;   Symptom Management: Take medications as prescribed   Attend all scheduled provider appointments Call provider office for new concerns or questions  call the Suicide and Crisis Lifeline: 988 call the [place] National Suicide Prevention Lifeline: (706)339-4225 or TTY: (928)155-7543 TTY 872-010-4385) to talk to a trained counselor call 1-800-273-TALK (toll free, 24 hour hotline) if experiencing a Mental Health or [place]  check blood pressure weekly learn about high blood pressure call doctor for signs and symptoms of high blood pressure keep all doctor appointments take medications for blood pressure exactly as prescribed report new symptoms to your doctor  Follow Up Plan: Telephone follow up appointment with care management team member scheduled for: 04-21-2022 at 145 pm       CCM:  Maintain, Monitor and Self-Manage Symptoms of COPD       Current Barriers:  Chronic Disease Management support and education needs related to effective management of COPD  Planned Interventions: Provided patient with basic written and verbal COPD education on self care/management/and exacerbation prevention Advised patient to track and  manage COPD triggers Provided written and verbal instructions on pursed lip breathing and utilized returned demonstration as teach back Provided instruction about proper use of medications used for management of COPD including inhalers Advised patient to self assesses COPD action plan zone and make appointment with provider if in the yellow zone for 48 hours without improvement Advised patient to engage in light exercise as tolerated 3-5 days a week to aid in the the management of COPD Provided education about and advised patient to utilize infection prevention strategies to reduce risk of respiratory infection Discussed the importance of adequate rest and management of fatigue with COPD Screening for signs and symptoms of depression related to chronic disease state  Assessed social determinant of health barriers  Symptom Management: Take medications as prescribed   Attend all scheduled provider appointments Call provider office for new concerns or questions  call the Suicide and Crisis Lifeline: 988 call the [place] National Suicide Prevention Lifeline: 904 586 4590 or TTY: 8017415185 TTY 570-605-4986) to talk to a trained counselor call 1-800-273-TALK (toll free, 24 hour hotline) if experiencing a [place] or [place]  avoid second hand smoke eliminate smoking in my home identify and remove indoor air pollutants limit outdoor activity during cold weather listen for public air quality announcements every day do breathing exercises every day develop a rescue plan eliminate symptom triggers at home follow rescue plan if symptoms flare-up  Follow Up Plan: Telephone follow up appointment with care management team member scheduled for: 04-21-2022 at 145 pm          Plan:Telephone follow up appointment with care management team member scheduled for:  04-21-2022 at 145 pm  Noreene Larsson RN, MSN, CCM RN Care Manager  Chronic Care Management Direct Number: [phone]

## 2022-02-24 NOTE — Patient Instructions (Signed)
Please call the care guide team at [phone] if you need to cancel or reschedule your appointment.   If you are experiencing a Mental Health or [place] or need someone to talk to, please call the Suicide and Crisis Lifeline: 988 call the [place] National Suicide Prevention Lifeline: 670-762-5360 or TTY: (206)376-1655 TTY (443)083-4902) to talk to a trained counselor call 1-800-273-TALK (toll free, 24 hour hotline)   Following is a copy of your full provider care plan:   Goals Addressed             This Visit's Progress    CCM Expected Outcome:  Monitor, Self-Manage and Reduce Symptoms of:HLD       Current Barriers:  Care Coordination needs related to medication needs and ongoing support and education from the pharm D for management of medications in a patient with HLD Chronic Disease Management support and education needs related to effective management of HLD  Planned Interventions: Provider established cholesterol goals reviewed; Counseled on importance of regular laboratory monitoring as prescribed; Provided HLD educational materials; Reviewed role and benefits of statin for ASCVD risk reduction; Discussed strategies to manage statin-induced myalgias; Reviewed importance of limiting foods high in cholesterol; Reviewed exercise goals and target of 150 minutes per week; Screening for signs and symptoms of depression related to chronic disease state;  Assessed social determinant of health barriers;  The patient states he is feeling more tired than usual. Discussed staying  hydrated, trying some protein shakes, and eating more fruits and vegetables. Education and support given.   Symptom Management: Take medications as prescribed   Attend all scheduled provider appointments Call provider office for new concerns or questions  call the Suicide and Crisis Lifeline: 988 call the [place] National Suicide Prevention Lifeline: 9524082800 or TTY: 315-227-4492 TTY  825-153-7160) to talk to a trained counselor call 1-800-273-TALK (toll free, 24 hour hotline) if experiencing a Mental Health or [place]  - call for medicine refill 2 or 3 days before it runs out - take all medications exactly as prescribed - call doctor with any symptoms you believe are related to your medicine - call doctor when you experience any new symptoms - go to all doctor appointments as scheduled - adhere to prescribed diet: heart healthy  Follow Up Plan: Telephone follow up appointment with care management team member scheduled for: 04-21-2022 at 145 pm       CCM Expected Outcome:  Monitor, Self-Manage, and Reduce Symptoms of Hypertension       Current Barriers:  Chronic Disease Management support and education needs related to effective management of HTN  Planned Interventions: Evaluation of current treatment plan related to hypertension self management and patient's adherence to plan as established by provider;   Provided education to patient re: stroke prevention, s/s of heart attack and stroke; Reviewed prescribed diet heart healthy diet Reviewed medications with patient and discussed importance of compliance. The patient is compliant with medications, works with pharm D;  Discussed plans with patient for ongoing care management follow up and provided patient with direct contact information for care management team; Advised patient, providing education and rationale, to monitor blood pressure daily and record, calling PCP for findings outside established parameters;  Reviewed scheduled/upcoming provider appointments including: 04-05-2022 at 9 am Advised patient to discuss changes in heart health, questions or concerns  with provider; Provided education on prescribed diet heart healthy diet ;  Discussed complications of poorly controlled blood pressure such as heart disease, stroke, circulatory complications, vision  complications, kidney impairment, sexual  dysfunction;  Screening for signs and symptoms of depression related to chronic disease state;  Assessed social determinant of health barriers;   Symptom Management: Take medications as prescribed   Attend all scheduled provider appointments Call provider office for new concerns or questions  call the Suicide and Crisis Lifeline: 988 call the [place] National Suicide Prevention Lifeline: 765-561-2426 or TTY: 202-032-0566 TTY [phone]) to talk to a trained counselor call 1-800-273-TALK (toll free, 24 hour hotline) if experiencing a Mental Health or [place]  check blood pressure weekly learn about high blood pressure call doctor for signs and symptoms of high blood pressure keep all doctor appointments take medications for blood pressure exactly as prescribed report new symptoms to your doctor  Follow Up Plan: Telephone follow up appointment with care management team member scheduled for: 04-21-2022 at 145 pm       CCM:  Maintain, Monitor and Self-Manage Symptoms of COPD       Current Barriers:  Chronic Disease Management support and education needs related to effective management of COPD  Planned Interventions: Provided patient with basic written and verbal COPD education on self care/management/and exacerbation prevention Advised patient to track and manage COPD triggers Provided written and verbal instructions on pursed lip breathing and utilized returned demonstration as teach back Provided instruction about proper use of medications used for management of COPD including inhalers Advised patient to self assesses COPD action plan zone and make appointment with provider if in the yellow zone for 48 hours without improvement Advised patient to engage in light exercise as tolerated 3-5 days a week to aid in the the management of COPD Provided education about and advised patient to utilize infection prevention strategies to reduce risk of respiratory  infection Discussed the importance of adequate rest and management of fatigue with COPD Screening for signs and symptoms of depression related to chronic disease state  Assessed social determinant of health barriers  Symptom Management: Take medications as prescribed   Attend all scheduled provider appointments Call provider office for new concerns or questions  call the Suicide and Crisis Lifeline: 988 call the [place] National Suicide Prevention Lifeline: 906-677-2789 or TTY: (715) 399-1652 TTY [phone]) to talk to a trained counselor call 1-800-273-TALK (toll free, 24 hour hotline) if experiencing a [place] or [place]  avoid second hand smoke eliminate smoking in my home identify and remove indoor air pollutants limit outdoor activity during cold weather listen for public air quality announcements every day do breathing exercises every day develop a rescue plan eliminate symptom triggers at home follow rescue plan if symptoms flare-up  Follow Up Plan: Telephone follow up appointment with care management team member scheduled for: 04-21-2022 at 145 pm          Patient verbalizes understanding of instructions and care plan provided today and agrees to view in [place]. Active MyChart status and patient understanding of how to access instructions and care plan via MyChart confirmed with patient.     Telephone follow up appointment with care management team member scheduled for: 04-21-2022 at 145 pm

## 2022-03-12 DIAGNOSIS — J449 Chronic obstructive pulmonary disease, unspecified: Secondary | ICD-10-CM

## 2022-03-12 DIAGNOSIS — I10 Essential (primary) hypertension: Secondary | ICD-10-CM | POA: Diagnosis not present

## 2022-03-12 DIAGNOSIS — E785 Hyperlipidemia, unspecified: Secondary | ICD-10-CM | POA: Diagnosis not present

## 2022-03-20 ENCOUNTER — Other Ambulatory Visit: Payer: Self-pay | Admitting: Family Medicine

## 2022-03-20 DIAGNOSIS — I119 Hypertensive heart disease without heart failure: Secondary | ICD-10-CM

## 2022-03-20 DIAGNOSIS — E782 Mixed hyperlipidemia: Secondary | ICD-10-CM

## 2022-04-04 NOTE — Progress Notes (Signed)
Subjective:  Patient ID: Scott Foley, male    DOB: [birth year]  Age: 57 y.o. MRN: [id number]  Chief Complaint  Patient presents with   Hyperlipidemia   Hypertension    HPI   Hypertension: Patient is taking Lisinopril 20 mg daily, Metoprolol 50 mg twice a day.  CAD: He is on Plavix 75 mg daily, rosuvastatin 40  mg daily at bedtime. History of PE/DVT. Numerous. On eliquis 5 mg twice daily.   Hyperlipidemia: Rosuvastatin 40 mg daily at bedtime, Repatha 140 mg every 14 days, Fish Oil 1000 mg twice a day.  COPD:  Trelegy 1 puff daily, Albuterol 108 mcg inhaler 2 puff every 6 hours PRN.  GERD: Pantoprazole 40 mg twice daily.     02/24/2022    2:08 PM 09/12/2021    2:02 PM 06/22/2021    3:16 PM 12/30/2020    1:37 PM 08/23/2020    8:58 AM  Depression screen PHQ 2/9  Decreased Interest 0 0 0 0 0  Down, Depressed, Hopeless 0 0 0 0 0  PHQ - 2 Score 0 0 0 0 0         07/22/2021   10:29 PM 07/23/2021   12:00 PM 08/01/2021   10:53 AM 09/12/2021    2:01 PM 02/24/2022    2:31 PM  Fall Risk  Falls in the past year?   1 1 0  Was there an injury with Fall?   0 0 0  Fall Risk Category Calculator   1 1 0  Fall Risk Category (Retired)   Low Low Low  (RETIRED) Patient Fall Risk Level Low fall risk Low fall risk  Low fall risk Low fall risk  Patient at Risk for Falls Due to    No Fall Risks No Fall Risks  Fall risk Follow up   Falls evaluation completed Falls evaluation completed;Falls prevention discussed Falls evaluation completed;Education provided      Review of Systems  Constitutional:  Positive for fatigue. Negative for chills and fever.  HENT:  Negative for congestion, rhinorrhea and sore throat.   Respiratory:  Negative for cough and shortness of breath.   Cardiovascular:  Negative for chest pain and palpitations.  Gastrointestinal:  Negative for abdominal pain, constipation, diarrhea, nausea and vomiting.  Genitourinary:  Negative for dysuria and urgency.   Musculoskeletal:  Positive for arthralgias (left arm pain. Starts in shoulder and extends to mid upper arm. No injuries. No medicines tried. going on x 1 month. Comes and goes.). Negative for back pain and myalgias.  Neurological:  Negative for dizziness and headaches.  Psychiatric/Behavioral:  Negative for dysphoric mood and sleep disturbance. The patient is not nervous/anxious.     Current Outpatient Medications on File Prior to Visit  Medication Sig Dispense Refill   albuterol (VENTOLIN HFA) 108 (90 Base) MCG/ACT inhaler INHALE TWO PUFFS BY MOUTH INTO LUNGS every SIX hours AS NEEDED FOR WHEEZING AND/OR SHORTNESS OF BREATH 8.5 g 2   apixaban (ELIQUIS) 5 MG TABS tablet TAKE ONE TABLET BY MOUTH EVERY MORNING and TAKE ONE TABLET BY MOUTH EVERY EVENING 180 tablet 1   clopidogrel (PLAVIX) 75 MG tablet Take 1 tablet (75 mg total) by mouth daily. 90 tablet 1   cyanocobalamin (VITAMIN B12) 1000 MCG tablet TAKE ONE TABLET BY MOUTH EVERY MORNING 90 tablet 3   Fluticasone-Umeclidin-Vilant (TRELEGY ELLIPTA) 100-62.5-25 MCG/ACT AEPB Inhale 1 puff into the lungs daily. 1 each 11   metoprolol tartrate (LOPRESSOR) 50 MG tablet TAKE ONE TABLET BY MOUTH  TWICE DAILY 180 tablet 1   Multiple Vitamin (MULTIVITAMIN WITH MINERALS) TABS tablet Take 1 tablet by mouth daily.     Omega-3 Fatty Acids (FISH OIL) 1000 MG CAPS Take 1 capsule (1,000 mg total) by mouth 2 (two) times daily. 180 capsule 0   ondansetron (ZOFRAN) 4 MG tablet Take 1 tablet (4 mg total) by mouth every 8 (eight) hours as needed for nausea or vomiting. 20 tablet 0   pantoprazole (PROTONIX) 40 MG tablet TAKE ONE TABLET BY MOUTH EVERY MORNING and TAKE ONE TABLET BY MOUTH EVERY EVENING 180 tablet 1   potassium chloride SA (KLOR-CON M) 20 MEQ tablet TAKE ONE TABLET BY MOUTH ONCE DAILY 30 tablet 3   rosuvastatin (CRESTOR) 40 MG tablet TAKE ONE TABLET BY MOUTH EVERY EVENING 90 tablet 1   No current facility-administered medications on file prior to visit.    Past Medical History:  Diagnosis Date   Acquired thrombophilia ([place]) 12/06/2020   Acute pancreatitis after endoscopic retrograde cholangiopancreatography (ERCP) 03/03/2021   ERCP by Dr Odie Sera in [place] 03/03/2021   B12 deficiency 07/04/2021   Chronic obstructive pulmonary disease ([place]) 07/16/2018   CKD (chronic kidney disease), stage II 07/04/2021   Colon polyp 10/25/2020   serrated adenoma.   Coronary artery disease due to lipid rich plaque 01/01/2021   Coronary artery disease with stable angina pectoris [facility])    AMI No heart cath, treated medically [facility]   Diverticulosis of colon 10/25/2020   Noted on colnoscopy by Dr Vicente Males in [place].   DNR (do not resuscitate) 03/03/2019   Encounter for hepatitis C screening test for low risk patient 07/04/2021   Essential hypertension 02/15/2021   History of blood clotting disorder 06/11/2021   History of CVA (cerebrovascular accident) 07/16/2018   Formatting of this note might be different from the original. CVA 3 years ago left with short term memory deficits. Has been on Plavix since.   History of pulmonary embolism 02/15/2021   Hyperlipemia    Hypertension    Hypertensive heart disease without congestive heart failure 12/06/2020   Hypoglycemia 04/15/2021   Hypokalemia 04/13/2021   Hypomagnesemia 06/11/2021   Intra-abdominal fluid collection 07/18/2021   Medication monitoring encounter 06/24/2021   Morbid obesity ([place]) 12/06/2020   BMI 35 with serious comorbidities including CAD, HTN, STROKE, Hyperlipidemia.   Normocytic anemia 04/15/2021   Pericardial effusion 04/29/2021   Pericardial tamponade    Protein-calorie malnutrition, severe 04/15/2021   Pulmonary nodules/lesions, multiple 11/25/2012   9/15/2014CT Chest : small nodule RUL LUL. Stable since 03/2012 Smoking cessation advised and Rx chantix Repeat CT Chest 4 /21/15: subpleural lymph nodes, likely benign repeat one year 2016>>>no change, considered benign Arlyce Harman 11/25/2012  NORMAL   [place],  post-stroke 07/28/2019   VRE (vancomycin-resistant Enterococci) infection 05/11/2021   Past Surgical History:  Procedure Laterality Date   COLONOSCOPY WITH PROPOFOL N/A 10/25/2020   Jonathon Bellows, MD, at [facility]. 25 MM serrated adenoma polyp at ascending colon removed and site clipped.  Pan diverticulosis.   IR CATHETER TUBE CHANGE  09/22/2021   IR CATHETER TUBE CHANGE  10/10/2021   IR RADIOLOGIST EVAL & MGMT  05/25/2021   IR RADIOLOGIST EVAL & MGMT  08/09/2021   IR RADIOLOGIST EVAL & MGMT  08/23/2021   IR RADIOLOGIST EVAL & MGMT  09/21/2021   IR RADIOLOGIST EVAL & MGMT  10/07/2021   LAPAROSCOPIC CHOLECYSTECTOMY  02/2021   PERICARDIOCENTESIS N/A 05/01/2021   Procedure: PERICARDIOCENTESIS;  Surgeon: Martinique, Peter M, MD;  Location: [facility];  Service: Cardiovascular;  Laterality: N/A;   TONSILLECTOMY      Family History  Problem Relation Age of Onset   Hypertension Father    Heart attack Father    Stroke Father    Prostate cancer Father    Hypertension Sister    Lung cancer Sister    Cancer Brother        lung   Hypertension Brother    Heart attack Brother    Social History   Socioeconomic History   Marital status: Married    Spouse name: Not on file   Number of children: Not on file   Years of education: Not on file   Highest education level: Not on file  Occupational History   Not on file  Tobacco Use   Smoking status: Former    Packs/day: 0.75    Years: 30.00    Total pack years: 22.50    Types: Cigarettes    Start date: 03/14/1979    Quit date: 11/2020    Years since quitting: 1.4   Smokeless tobacco: Never  Vaping Use   Vaping Use: Former   Start date: 07/28/2018  Substance and Sexual Activity   Alcohol use: No   Drug use: No   Sexual activity: Yes    Partners: Female  Other Topics Concern   Not on file  Social History Narrative   Not on file   Social Determinants of Health   Financial Resource Strain: Medium Risk (02/24/2022)   Overall Financial  Resource Strain (CARDIA)    Difficulty of Paying Living Expenses: Somewhat hard  Food Insecurity: No Food Insecurity (02/24/2022)   Hunger Vital Sign    Worried About Running Out of Food in the Last Year: Never true    [place] in the Last Year: Never true  Transportation Needs: No Transportation Needs (02/24/2022)   PRAPARE - Hydrologist (Medical): No    Lack of Transportation (Non-Medical): No  Physical Activity: Insufficiently Active (02/24/2022)   Exercise Vital Sign    Days of Exercise per Week: 3 days    Minutes of Exercise per Session: 30 min  Stress: No Stress Concern Present (02/24/2022)   [place]    Feeling of Stress : Not at all  Social Connections: Moderately Isolated (02/24/2022)   Social Connection and Isolation Panel [NHANES]    Frequency of Communication with Friends and Family: More than three times a week    Frequency of Social Gatherings with Friends and Family: More than three times a week    Attends Religious Services: Never    Marine scientist or Organizations: No    Attends Music therapist: Never    Marital Status: Married    Objective:  BP 130/78   Pulse 68   Temp (!) 97.2 F (36.2 C)   Resp 18   Ht '5\' 4"'$  (1.626 m)   Wt 212 lb (96.2 kg)   BMI 36.39 kg/m      04/05/2022    9:05 AM 12/01/2021    8:17 AM 11/15/2021    2:01 PM  BP/Weight  Systolic BP 329 924 268  Diastolic BP 78 80 80  Wt. (Lbs) 212 196 194.2  BMI 36.39 kg/m2 33.64 kg/m2 33.33 kg/m2    Physical Exam Vitals reviewed.  Constitutional:      Appearance: Normal appearance.  Neck:     Vascular: No carotid bruit.  Cardiovascular:     Rate and Rhythm: Normal rate and regular rhythm.     Pulses: Normal pulses.     Heart sounds: Normal heart sounds.  Pulmonary:     Effort: Pulmonary effort is normal.     Breath sounds: Normal breath sounds. No wheezing,  rhonchi or rales.  Abdominal:     General: Bowel sounds are normal.     Palpations: Abdomen is soft.     Tenderness: There is no abdominal tenderness.  Musculoskeletal:     Comments: RIGHT SHOULDER EXAM TENDER: NONE FROM NORMAL  LEFT SHOULDER EXAM TENDER: Anterior FROM NORMAL,  BUT CAUSES PAIN WITH ABDUCTION, EXTERNAL ROTATION, AND INTERNAL ROTATION: EMPTY CAN SIGN: NEGATIVE.   Neurological:     Mental Status: He is alert.  Psychiatric:        Mood and Affect: Mood normal.        Behavior: Behavior normal.     Diabetic Foot Exam - Simple   No data filed      Lab Results  Component Value Date   WBC 11.0 (H) 04/05/2022   HGB 15.1 04/05/2022   HCT 45.4 04/05/2022   PLT 297 04/05/2022   GLUCOSE 78 04/05/2022   CHOL 70 (L) 04/05/2022   TRIG 129 04/05/2022   HDL 38 (L) 04/05/2022   LDLCALC 9 04/05/2022   ALT 33 04/05/2022   AST 17 04/05/2022   NA 137 04/05/2022   K 5.2 04/05/2022   CL 102 04/05/2022   CREATININE 1.63 (H) 04/05/2022   BUN 31 (H) 04/05/2022   CO2 19 (L) 04/05/2022   TSH 3.170 04/05/2022   INR 1.2 07/21/2021      Assessment & Plan:    Hypertensive heart disease without congestive heart failure Assessment & Plan: Well controlled.  No changes to medicines. Lisinopril 20 mg daily, Metoprolol 50 mg twice a day.  Continue to work on eating a healthy diet and exercise.  Labs drawn today.    Orders: -     Comprehensive metabolic panel -     CBC with Differential/Platelet  Simple chronic bronchitis (HCC) Assessment & Plan: The current medical regimen is effective;  continue present plan and medications.  Trelegy 1 puff daily, Albuterol 108 mcg inhaler 2 puff every 6 hours PRN.    CKD (chronic kidney disease), stage II Assessment & Plan: Stable   Mixed hyperlipidemia Assessment & Plan: Well controlled.  No changes to medicines. Rosuvastatin 40 mg daily at bedtime, Repatha 140 mg every 14 days, Fish Oil 1000 mg twice a day.  Continue to  work on eating a healthy diet and exercise.  Labs drawn today.    Orders: -     Lipid panel  B12 deficiency Assessment & Plan: Check labs  Orders: -     Vitamin B12  Other fatigue Assessment & Plan: Check labs.  Orders: -     TSH  Impingement of left shoulder Assessment & Plan: Risks were discussed including bleeding, infection, increase in sugars if diabetic, atrophy at site of injection, and increased pain.  After consent was obtained, using sterile technique the left shoulder was prepped with alcohol.  The joint was entered posteriorly and  Kenalog 40 mg and 5 ml plain Lidocaine was then injected and the needle withdrawn.  The procedure was well tolerated.   The patient is asked to continue to rest the joint for a few more days before resuming regular activities.  It may be more painful for the first 1-2 days.  Watch for fever, or increased swelling or persistent pain in the joint. Call or return to clinic prn if such symptoms occur or there is failure to improve as anticipated.   Recommend tylenol as directed otc.  Recommend exercises.  Recommend get shoulder xray If not improving by next week.  Based on xray results and response to exercises and shot, would get mri of your shoulder and possibly refer to either physical therapy or Orthopedics.  Please call if not fully better in one month.   Orders: -     Triamcinolone Acetonide -     DG Shoulder Left; Future  Other orders -     Cardiovascular Risk Assessment     Meds ordered this encounter  Medications   triamcinolone acetonide (KENALOG-40) injection 40 mg    Orders Placed This Encounter  Procedures   DG Shoulder Left   Comprehensive metabolic panel   Lipid panel   CBC with Differential/Platelet   Vitamin B12   TSH   Cardiovascular Risk Assessment     Follow-up: Return in about 4 months (around 08/04/2022) for chronic fasting.  An After Visit Summary was printed and given to the patient.  Thompson Caul, CMA,acting as a scribe for [name], MD.,have documented all relevant documentation on the behalf of [name], MD,as directed by  [name], MD while in the presence of [name], MD.   [name], MD [place] [phone]

## 2022-04-05 ENCOUNTER — Encounter: Payer: Self-pay | Admitting: Family Medicine

## 2022-04-05 ENCOUNTER — Ambulatory Visit (INDEPENDENT_AMBULATORY_CARE_PROVIDER_SITE_OTHER): Payer: Medicare HMO | Admitting: Family Medicine

## 2022-04-05 VITALS — BP 130/78 | HR 68 | Temp 97.2°F | Resp 18 | Ht 64.0 in | Wt 212.0 lb

## 2022-04-05 DIAGNOSIS — J410 Simple chronic bronchitis: Secondary | ICD-10-CM | POA: Diagnosis not present

## 2022-04-05 DIAGNOSIS — I1310 Hypertensive heart and chronic kidney disease without heart failure, with stage 1 through stage 4 chronic kidney disease, or unspecified chronic kidney disease: Secondary | ICD-10-CM | POA: Diagnosis not present

## 2022-04-05 DIAGNOSIS — R5383 Other fatigue: Secondary | ICD-10-CM

## 2022-04-05 DIAGNOSIS — E782 Mixed hyperlipidemia: Secondary | ICD-10-CM | POA: Diagnosis not present

## 2022-04-05 DIAGNOSIS — M25812 Other specified joint disorders, left shoulder: Secondary | ICD-10-CM | POA: Insufficient documentation

## 2022-04-05 DIAGNOSIS — N182 Chronic kidney disease, stage 2 (mild): Secondary | ICD-10-CM | POA: Diagnosis not present

## 2022-04-05 DIAGNOSIS — I119 Hypertensive heart disease without heart failure: Secondary | ICD-10-CM | POA: Diagnosis not present

## 2022-04-05 DIAGNOSIS — E538 Deficiency of other specified B group vitamins: Secondary | ICD-10-CM

## 2022-04-05 HISTORY — DX: Other specified joint disorders, left shoulder: M25.812

## 2022-04-05 MED ORDER — TRIAMCINOLONE ACETONIDE 40 MG/ML IJ SUSP
40.0000 mg | Freq: Once | INTRAMUSCULAR | Status: AC
  Administered 2022-04-05: 40 mg via INTRA_ARTICULAR

## 2022-04-05 NOTE — Patient Instructions (Addendum)
Recommend tylenol as directed otc.  Recommend exercises.  Recommend get shoulder xray If not improving by next week.  Based on xray results and response to exercises and shot, would get mri of your shoulder and possibly refer to either physical therapy or Orthopedics.  Please call if not fully better in one month.

## 2022-04-05 NOTE — Assessment & Plan Note (Addendum)
Risks were discussed including bleeding, infection, increase in sugars if diabetic, atrophy at site of injection, and increased pain.  After consent was obtained, using sterile technique the left shoulder was prepped with alcohol.  The joint was entered posteriorly and  Kenalog 40 mg and 5 ml plain Lidocaine was then injected and the needle withdrawn.  The procedure was well tolerated.   The patient is asked to continue to rest the joint for a few more days before resuming regular activities.  It may be more painful for the first 1-2 days.  Watch for fever, or increased swelling or persistent pain in the joint. Call or return to clinic prn if such symptoms occur or there is failure to improve as anticipated.   Recommend tylenol as directed otc.  Recommend exercises.  Recommend get shoulder xray If not improving by next week.  Based on xray results and response to exercises and shot, would get mri of your shoulder and possibly refer to either physical therapy or Orthopedics.  Please call if not fully better in one month.

## 2022-04-06 LAB — COMPREHENSIVE METABOLIC PANEL
ALT: 33 IU/L (ref 0–44)
AST: 17 IU/L (ref 0–40)
Albumin/Globulin Ratio: 1.7 (ref 1.2–2.2)
Albumin: 4.7 g/dL (ref 3.8–4.9)
Alkaline Phosphatase: 99 IU/L (ref 44–121)
BUN/Creatinine Ratio: 19 (ref 9–20)
BUN: 31 mg/dL — ABNORMAL HIGH (ref 6–24)
Bilirubin Total: 0.3 mg/dL (ref 0.0–1.2)
CO2: 19 mmol/L — ABNORMAL LOW (ref 20–29)
Calcium: 10 mg/dL (ref 8.7–10.2)
Chloride: 102 mmol/L (ref 96–106)
Creatinine, Ser: 1.63 mg/dL — ABNORMAL HIGH (ref 0.76–1.27)
Globulin, Total: 2.7 g/dL (ref 1.5–4.5)
Glucose: 78 mg/dL (ref 70–99)
Potassium: 5.2 mmol/L (ref 3.5–5.2)
Sodium: 137 mmol/L (ref 134–144)
Total Protein: 7.4 g/dL (ref 6.0–8.5)
eGFR: 49 mL/min/{1.73_m2} — ABNORMAL LOW (ref >59)

## 2022-04-06 LAB — CBC WITH DIFFERENTIAL/PLATELET
Basophils Absolute: 0.1 10*3/uL (ref 0.0–0.2)
Basos: 1 %
EOS (ABSOLUTE): 0.1 10*3/uL (ref 0.0–0.4)
Eos: 1 %
Hematocrit: 45.4 % (ref 37.5–51.0)
Hemoglobin: 15.1 g/dL (ref 13.0–17.7)
Immature Grans (Abs): 0 10*3/uL (ref 0.0–0.1)
Immature Granulocytes: 0 %
Lymphocytes Absolute: 4 10*3/uL — ABNORMAL HIGH (ref 0.7–3.1)
Lymphs: 36 %
MCH: 30.3 pg (ref 26.6–33.0)
MCHC: 33.3 g/dL (ref 31.5–35.7)
MCV: 91 fL (ref 79–97)
Monocytes Absolute: 0.8 10*3/uL (ref 0.1–0.9)
Monocytes: 7 %
Neutrophils Absolute: 6.1 10*3/uL (ref 1.4–7.0)
Neutrophils: 55 %
Platelets: 297 10*3/uL (ref 150–450)
RBC: 4.98 x10E6/uL (ref 4.14–5.80)
RDW: 13.2 % (ref 11.6–15.4)
WBC: 11 10*3/uL — ABNORMAL HIGH (ref 3.4–10.8)

## 2022-04-06 LAB — LIPID PANEL
Chol/HDL Ratio: 1.8 ratio (ref 0.0–5.0)
Cholesterol, Total: 70 mg/dL — ABNORMAL LOW (ref 100–199)
HDL: 38 mg/dL — ABNORMAL LOW (ref >39)
LDL Chol Calc (NIH): 9 mg/dL (ref 0–99)
Triglycerides: 129 mg/dL (ref 0–149)
VLDL Cholesterol Cal: 23 mg/dL (ref 5–40)

## 2022-04-06 LAB — VITAMIN B12: Vitamin B-12: 1217 pg/mL (ref 232–1245)

## 2022-04-06 LAB — TSH: TSH: 3.17 u[IU]/mL (ref 0.450–4.500)

## 2022-04-06 LAB — CARDIOVASCULAR RISK ASSESSMENT

## 2022-04-06 NOTE — Progress Notes (Signed)
Blood count abnormal. Wbc elevated slightly Liver function normal.  Kidney function abnormal. Worsened. Recommend hydration. No nsaids. Hold lisinopril. Recommend valsartan 40 mg daily. Recheck cmp in 2 weeks.  Thyroid function normal.  Cholesterol: good B12 normal.

## 2022-04-09 NOTE — Assessment & Plan Note (Signed)
The current medical regimen is effective;  continue present plan and medications.  Trelegy 1 puff daily, Albuterol 108 mcg inhaler 2 puff every 6 hours PRN.

## 2022-04-09 NOTE — Assessment & Plan Note (Addendum)
Well controlled.  Hold lisinopril. Recommend valsartan 40 mg daily. Recheck cmp in 2 weeks. Continue to work on eating a healthy diet and exercise.  Labs drawn today.

## 2022-04-09 NOTE — Assessment & Plan Note (Signed)
Stable

## 2022-04-09 NOTE — Assessment & Plan Note (Signed)
Check labs 

## 2022-04-09 NOTE — Assessment & Plan Note (Signed)
>>  ASSESSMENT AND PLAN FOR HYPERLIPEMIA WRITTEN ON 04/09/2022  7:43 PM BY LEAL-BORJAS, Sofiah Lyne I, CMA  Well controlled.  No changes to medicines. Rosuvastatin  40 mg daily at bedtime, Repatha  140 mg every 14 days, Fish Oil  1000 mg twice a day.  Continue to work on eating a healthy diet and exercise.  Labs drawn today.

## 2022-04-09 NOTE — Assessment & Plan Note (Signed)
Well controlled.  No changes to medicines. Rosuvastatin 40 mg daily at bedtime, Repatha 140 mg every 14 days, Fish Oil 1000 mg twice a day.  Continue to work on eating a healthy diet and exercise.  Labs drawn today.

## 2022-04-10 ENCOUNTER — Other Ambulatory Visit: Payer: Self-pay

## 2022-04-10 DIAGNOSIS — N289 Disorder of kidney and ureter, unspecified: Secondary | ICD-10-CM

## 2022-04-10 MED ORDER — VALSARTAN 40 MG PO TABS: 40.0000 mg | ORAL_TABLET | Freq: Every day | ORAL | 0 refills | Status: DC

## 2022-04-12 ENCOUNTER — Telehealth: Payer: Self-pay

## 2022-04-12 ENCOUNTER — Other Ambulatory Visit: Payer: Self-pay

## 2022-04-12 MED ORDER — REPATHA SURECLICK 140 MG/ML ~~LOC~~ SOAJ: 140.0000 mg | SUBCUTANEOUS | 3 refills | Status: DC

## 2022-04-12 NOTE — Progress Notes (Signed)
Care Management & Coordination Services Pharmacy Team  Reason for Encounter: Medication coordination and delivery  Contacted patient to discuss medications and coordinate delivery from [facility].  Spoke with patient on 04/12/2022   Cycle dispensing form sent to [name] for review.   Last adherence delivery date:02/22/22      Patient is due for next adherence delivery on: 04/24/22  This delivery to include: Adherence Packaging  30 Days  Eliquis '5mg'$  1 at B and 1 EM Clopidogrel '75mg'$  1 B Vitamin B12 1039mg 1 B Trelegy Inhaler 1067m 1 puff once daily  Metoprolol Tar '50mg'$  1 B and 1 EM Mens Multi 1 at EM Fish Oil '1000mg'$  1 B and 1 EM Pantoprazole '40mg'$  1 B and 1 EM Rosuvastatin '40mg'$  1 EM Potassium Chloride 2030m 1 in B Repatha Sureclick '140mg'$ - Inject '140mg'$  every 14 days  Albuterol 108m58mnhaler- Inhale 2 puffs into the lungs every 6 (six) hours as needed  Valsartan '40mg'$  1 B  Patient declined the following medications this month: None  Refills requested from providers include: Repatha Sureclick '140mg'$   Confirmed delivery date of 04/24/22, advised patient that pharmacy will contact them the morning of delivery.  Any concerns about your medications? No  How often do you forget or accidentally miss a dose? Never  Do you use a pillbox? No  Is patient in packaging Yes  If yes  Any concerns or issues with your packaging? No concerns    Recent blood pressure readings are as follows:04/05/22 130/78  Chart review: Recent office visits:  04/10/22 [facility] Ovens. Orders Only. D/C Lisinopril '20mg'$  and Started on Valsartan '40mg'$ .   04/05/22 [name],[name] Seen for routine visit. No med changes.   Recent consult visits:  02/21/22 (General Surgery) Telemedicine. Seen for follow up. No med changes.   Hospital visits:  None  Medications: Outpatient Encounter Medications as of 04/12/2022  Medication Sig   valsartan (DIOVAN) 40 MG tablet Take 1 tablet (40 mg total)  by mouth daily.   albuterol (VENTOLIN HFA) 108 (90 Base) MCG/ACT inhaler INHALE TWO PUFFS BY MOUTH INTO LUNGS every SIX hours AS NEEDED FOR WHEEZING AND/OR SHORTNESS OF BREATH   apixaban (ELIQUIS) 5 MG TABS tablet TAKE ONE TABLET BY MOUTH EVERY MORNING and TAKE ONE TABLET BY MOUTH EVERY EVENING   clopidogrel (PLAVIX) 75 MG tablet Take 1 tablet (75 mg total) by mouth daily.   cyanocobalamin (VITAMIN B12) 1000 MCG tablet TAKE ONE TABLET BY MOUTH EVERY MORNING   Fluticasone-Umeclidin-Vilant (TRELEGY ELLIPTA) 100-62.5-25 MCG/ACT AEPB Inhale 1 puff into the lungs daily.   metoprolol tartrate (LOPRESSOR) 50 MG tablet TAKE ONE TABLET BY MOUTH TWICE DAILY   Multiple Vitamin (MULTIVITAMIN WITH MINERALS) TABS tablet Take 1 tablet by mouth daily.   Omega-3 Fatty Acids (FISH OIL) 1000 MG CAPS Take 1 capsule (1,000 mg total) by mouth 2 (two) times daily.   ondansetron (ZOFRAN) 4 MG tablet Take 1 tablet (4 mg total) by mouth every 8 (eight) hours as needed for nausea or vomiting.   pantoprazole (PROTONIX) 40 MG tablet TAKE ONE TABLET BY MOUTH EVERY MORNING and TAKE ONE TABLET BY MOUTH EVERY EVENING   potassium chloride SA (KLOR-CON M) 20 MEQ tablet TAKE ONE TABLET BY MOUTH ONCE DAILY   REPATHA SURECLICK 140 841ML SOAJ Inject 140 mg into the skin every 14 (fourteen) days.   rosuvastatin (CRESTOR) 40 MG tablet TAKE ONE TABLET BY MOUTH EVERY EVENING   No facility-administered encounter medications on file as of 04/12/2022.  BP Readings from Last 3 Encounters:  04/05/22 130/78  12/01/21 136/80  11/15/21 131/80    Pulse Readings from Last 3 Encounters:  04/05/22 68  12/01/21 (!) 54  11/15/21 72    No results found for: "HGBA1C" Lab Results  Component Value Date   CREATININE 1.63 (H) 04/05/2022   BUN 31 (H) 04/05/2022   GFRNONAA >60 07/23/2021   GFRAA 105 12/15/2019   NA 137 04/05/2022   K 5.2 04/05/2022   CALCIUM 10.0 04/05/2022   CO2 19 (L) 04/05/2022     [name], [place] Clinical  Pharmacist Assistant  [phone]

## 2022-04-12 NOTE — Telephone Encounter (Signed)
Compliant on meds 

## 2022-04-19 ENCOUNTER — Other Ambulatory Visit: Payer: Medicare HMO

## 2022-04-19 DIAGNOSIS — N289 Disorder of kidney and ureter, unspecified: Secondary | ICD-10-CM | POA: Diagnosis not present

## 2022-04-19 DIAGNOSIS — M25512 Pain in left shoulder: Secondary | ICD-10-CM | POA: Diagnosis not present

## 2022-04-19 DIAGNOSIS — M25812 Other specified joint disorders, left shoulder: Secondary | ICD-10-CM | POA: Diagnosis not present

## 2022-04-19 LAB — COMPREHENSIVE METABOLIC PANEL
ALT: 41 IU/L (ref 0–44)
AST: 15 IU/L (ref 0–40)
Albumin/Globulin Ratio: 1.8 (ref 1.2–2.2)
Albumin: 4.5 g/dL (ref 3.8–4.9)
Alkaline Phosphatase: 98 IU/L (ref 44–121)
BUN/Creatinine Ratio: 20 (ref 9–20)
BUN: 30 mg/dL — ABNORMAL HIGH (ref 6–24)
Bilirubin Total: 0.4 mg/dL (ref 0.0–1.2)
CO2: 19 mmol/L — ABNORMAL LOW (ref 20–29)
Calcium: 9.8 mg/dL (ref 8.7–10.2)
Chloride: 101 mmol/L (ref 96–106)
Creatinine, Ser: 1.53 mg/dL — ABNORMAL HIGH (ref 0.76–1.27)
Globulin, Total: 2.5 g/dL (ref 1.5–4.5)
Glucose: 86 mg/dL (ref 70–99)
Potassium: 5.7 mmol/L — ABNORMAL HIGH (ref 3.5–5.2)
Sodium: 136 mmol/L (ref 134–144)
Total Protein: 7 g/dL (ref 6.0–8.5)
eGFR: 53 mL/min/{1.73_m2} — ABNORMAL LOW (ref >59)

## 2022-04-21 ENCOUNTER — Other Ambulatory Visit: Payer: Self-pay | Admitting: Family Medicine

## 2022-04-21 ENCOUNTER — Ambulatory Visit (INDEPENDENT_AMBULATORY_CARE_PROVIDER_SITE_OTHER): Payer: Medicare HMO

## 2022-04-21 ENCOUNTER — Telehealth: Payer: Medicare HMO

## 2022-04-21 DIAGNOSIS — I10 Essential (primary) hypertension: Secondary | ICD-10-CM

## 2022-04-21 DIAGNOSIS — I119 Hypertensive heart disease without heart failure: Secondary | ICD-10-CM

## 2022-04-21 DIAGNOSIS — E782 Mixed hyperlipidemia: Secondary | ICD-10-CM

## 2022-04-21 DIAGNOSIS — J410 Simple chronic bronchitis: Secondary | ICD-10-CM

## 2022-04-21 DIAGNOSIS — J449 Chronic obstructive pulmonary disease, unspecified: Secondary | ICD-10-CM

## 2022-04-21 NOTE — Chronic Care Management (AMB) (Signed)
Chronic Care Management   CCM RN Visit Note  04/21/2022 Name: Blue Isenhart MRN: [id number] DOB: 1965/12/14  Subjective: Scott Foley is a 57 y.o. year old male who is a primary care patient of [name], [name], MD. The patient was referred to the Chronic Care Management team for assistance with care management needs subsequent to provider initiation of CCM services and plan of care.    Today's Visit:  Engaged with patient by telephone for follow up visit.        Goals Addressed             This Visit's Progress    CCM Expected Outcome:  Monitor, Self-Manage and Reduce Symptoms of:HLD       Current Barriers:  Care Coordination needs related to medication needs and ongoing support and education from the pharm D for management of medications in a patient with HLD Chronic Disease Management support and education needs related to effective management of HLD   Lab Results  Component Value Date   CHOL 70 (L) 04/05/2022   HDL 38 (L) 04/05/2022   LDLCALC 9 04/05/2022   TRIG 129 04/05/2022   CHOLHDL 1.8 04/05/2022    Planned Interventions: Provider established cholesterol goals reviewed, Review of labs, education provided ; Counseled on importance of regular laboratory monitoring as prescribed. The patient has labs on a regular basis. Labs are up to date; Provided HLD educational materials; Reviewed role and benefits of statin for ASCVD risk reduction; Discussed strategies to manage statin-induced myalgias; Reviewed importance of limiting foods high in cholesterol. Education provided on a heart healthy diet.; Reviewed exercise goals and target of 150 minutes per week; Screening for signs and symptoms of depression related to chronic disease state;  Assessed social determinant of health barriers;  The patient states he is feeling more tired than usual. Discussed staying  hydrated, trying some protein shakes, and eating more fruits and vegetables. Education and support  given.   Symptom Management: Take medications as prescribed   Attend all scheduled provider appointments Call provider office for new concerns or questions  call the Suicide and Crisis Lifeline: 988 call the [place] National Suicide Prevention Lifeline: (807) 767-0500 or TTY: (281)469-8274 TTY 249-709-5669) to talk to a trained counselor call 1-800-273-TALK (toll free, 24 hour hotline) if experiencing a Mental Health or [place]  - call for medicine refill 2 or 3 days before it runs out - take all medications exactly as prescribed - call doctor with any symptoms you believe are related to your medicine - call doctor when you experience any new symptoms - go to all doctor appointments as scheduled - adhere to prescribed diet: heart healthy  Follow Up Plan: Telephone follow up appointment with care management team member scheduled for: 06-30-2022 at 145 pm       CCM Expected Outcome:  Monitor, Self-Manage, and Reduce Symptoms of Hypertension       Current Barriers:  Chronic Disease Management support and education needs related to effective management of HTN  BP Readings from Last 3 Encounters:  04/05/22 130/78  12/01/21 136/80  11/15/21 131/80    Planned Interventions: Evaluation of current treatment plan related to hypertension self management and patient's adherence to plan as established by provider. Blood pressures are stable. The patient denies any acute changes in HTN or heart health. Saw pcp on 04-05-2022. Had repeat labs on 04-19-2022 and showed decline in kidney function. Review of labs and gave the patient the recommendations by Dr. Tobie Poet to come  back to the office on 04-25-2022 for repeat CMP. The patient verbalized understanding. In basket message sent to the pcp letting the pcp know of RNCM reviewing labs and provider recommendations to the patient today during outreach call. ;   Provided education to patient re: stroke prevention, s/s of heart attack and  stroke; Reviewed prescribed diet heart healthy diet. Education provided on not eating foods high in potassium right now due to the patient having elevated potassium on blood draw this week. Review of limiting bananas and spinach and other foods rich in potassium.  Reviewed medications with patient and discussed importance of compliance. The patient is compliant with medications, works with pharm D. Review and education provided to hold his potassium that he takes daily at 20 meq. The patient to have new blood work on 04-25-2022 The patient states he has not received his medication shipment [facility] yet. He says it is due around the 12th. He states it should come any day now. Education and support given;  Discussed plans with patient for ongoing care management follow up and provided patient with direct contact information for care management team; Advised patient, providing education and rationale, to monitor blood pressure daily and record, calling PCP for findings outside established parameters;  Reviewed scheduled/upcoming provider appointments including: 08-04-2022 at 9 am Advised patient to discuss changes in heart health, questions or concerns  with provider; Provided education on prescribed diet heart healthy diet. Education on staying hydrated and monitoring foods high in potassium;  Discussed complications of poorly controlled blood pressure such as heart disease, stroke, circulatory complications, vision complications, kidney impairment, sexual dysfunction;  Screening for signs and symptoms of depression related to chronic disease state;  Assessed social determinant of health barriers;   Symptom Management: Take medications as prescribed   Attend all scheduled provider appointments Call provider office for new concerns or questions  call the Suicide and Crisis Lifeline: 988 call the [place] National Suicide Prevention Lifeline: 6294317114 or TTY: (914)737-9198 TTY 802-243-9112) to talk  to a trained counselor call 1-800-273-TALK (toll free, 24 hour hotline) if experiencing a Mental Health or [place]  check blood pressure weekly learn about high blood pressure call doctor for signs and symptoms of high blood pressure keep all doctor appointments take medications for blood pressure exactly as prescribed report new symptoms to your doctor  Follow Up Plan: Telephone follow up appointment with care management team member scheduled for: 06-30-2022 at 145 pm       CCM:  Maintain, Monitor and Self-Manage Symptoms of COPD       Current Barriers:  Chronic Disease Management support and education needs related to effective management of COPD  Planned Interventions: Provided patient with basic written and verbal COPD education on self care/management/and exacerbation prevention Advised patient to track and manage COPD triggers. Knows triggers and factors that cause exacerbation. Denies any acute changes in his COPD. Saw pcp on 04-05-2022 Provided written and verbal instructions on pursed lip breathing and utilized returned demonstration as teach back Provided instruction about proper use of medications used for management of COPD including inhalers. The patient is compliant with medications. Denies any new concerns with medications Advised patient to self assesses COPD action plan zone and make appointment with provider if in the yellow zone for 48 hours without improvement Advised patient to engage in light exercise as tolerated 3-5 days a week to aid in the the management of COPD Provided education about and advised patient to utilize infection prevention strategies  to reduce risk of respiratory infection. Education and support provided.  Discussed the importance of adequate rest and management of fatigue with COPD Screening for signs and symptoms of depression related to chronic disease state  Assessed social determinant of health barriers  Symptom  Management: Take medications as prescribed   Attend all scheduled provider appointments Call provider office for new concerns or questions  call the Suicide and Crisis Lifeline: 988 call the [place] National Suicide Prevention Lifeline: 364-181-7540 or TTY: 412-673-3887 TTY 518-025-0341) to talk to a trained counselor call 1-800-273-TALK (toll free, 24 hour hotline) if experiencing a Mental Health or [place]  avoid second hand smoke eliminate smoking in my home identify and remove indoor air pollutants limit outdoor activity during cold weather listen for public air quality announcements every day do breathing exercises every day develop a rescue plan eliminate symptom triggers at home follow rescue plan if symptoms flare-up  Follow Up Plan: Telephone follow up appointment with care management team member scheduled for: 06-30-2022 at 145 pm          Plan:Telephone follow up appointment with care management team member scheduled for:  06-30-2022 at 145 pm  [place], MSN, CCM RN Care Manager  Chronic Care Management Direct Number: [phone]

## 2022-04-21 NOTE — Patient Instructions (Addendum)
Please call the care guide team at [phone] if you need to cancel or reschedule your appointment.   If you are experiencing a Mental Health or [place] or need someone to talk to, please call the Suicide and Crisis Lifeline: 988 call the [place] National Suicide Prevention Lifeline: 334-659-4012 or TTY: 618-067-6242 TTY (704)068-8274) to talk to a trained counselor call 1-800-273-TALK (toll free, 24 hour hotline) go to [facility] [phone])   Following is a copy of the CCM Program Consent:  CCM service includes personalized support from designated clinical staff supervised by the physician, including individualized plan of care and coordination with other care providers 24/7 contact phone numbers for assistance for urgent and routine care needs. Service will only be billed when office clinical staff spend 20 minutes or more in a month to coordinate care. Only one practitioner may furnish and bill the service in a calendar month. The patient may stop CCM services at amy time (effective at the end of the month) by phone call to the office staff. The patient will be responsible for cost sharing (co-pay) or up to 20% of the service fee (after annual deductible is met)  Following is a copy of your full provider care plan:   Goals Addressed             This Visit's Progress    CCM Expected Outcome:  Monitor, Self-Manage and Reduce Symptoms of:HLD       Current Barriers:  Care Coordination needs related to medication needs and ongoing support and education from the pharm D for management of medications in a patient with HLD Chronic Disease Management support and education needs related to effective management of HLD   Lab Results  Component Value Date   CHOL 70 (L) 04/05/2022   HDL 38 (L) 04/05/2022   [place] 9 04/05/2022   TRIG 129 04/05/2022   CHOLHDL 1.8 04/05/2022    Planned  Interventions: Provider established cholesterol goals reviewed, Review of labs, education provided ; Counseled on importance of regular laboratory monitoring as prescribed. The patient has labs on a regular basis. Labs are up to date; Provided HLD educational materials; Reviewed role and benefits of statin for ASCVD risk reduction; Discussed strategies to manage statin-induced myalgias; Reviewed importance of limiting foods high in cholesterol. Education provided on a heart healthy diet.; Reviewed exercise goals and target of 150 minutes per week; Screening for signs and symptoms of depression related to chronic disease state;  Assessed social determinant of health barriers;  The patient states he is feeling more tired than usual. Discussed staying  hydrated, trying some protein shakes, and eating more fruits and vegetables. Education and support given.   Symptom Management: Take medications as prescribed   Attend all scheduled provider appointments Call provider office for new concerns or questions  call the Suicide and Crisis Lifeline: 988 call the [place] National Suicide Prevention Lifeline: (220)711-4925 or TTY: (337)124-9529 TTY (279)294-5213) to talk to a trained counselor call 1-800-273-TALK (toll free, 24 hour hotline) if experiencing a Mental Health or [place]  - call for medicine refill 2 or 3 days before it runs out - take all medications exactly as prescribed - call doctor with any symptoms you believe are related to your medicine - call doctor when you experience any new symptoms - go to all doctor appointments as scheduled - adhere to prescribed diet: heart healthy  Follow Up Plan: Telephone follow up appointment with care  management team member scheduled for: 06-30-2022 at 145 pm       CCM Expected Outcome:  Monitor, Self-Manage, and Reduce Symptoms of Hypertension       Current Barriers:  Chronic Disease Management support and education needs related to  effective management of HTN  BP Readings from Last 3 Encounters:  04/05/22 130/78  12/01/21 136/80  11/15/21 131/80    Planned Interventions: Evaluation of current treatment plan related to hypertension self management and patient's adherence to plan as established by provider. Blood pressures are stable. The patient denies any acute changes in HTN or heart health. Saw pcp on 04-05-2022. Had repeat labs on 04-19-2022 and showed decline in kidney function. Review of labs and gave the patient the recommendations by Dr. Tobie Poet to come back to the office on 04-25-2022 for repeat CMP. The patient verbalized understanding. In basket message sent to the pcp letting the pcp know of RNCM reviewing labs and provider recommendations to the patient today during outreach call. ;   Provided education to patient re: stroke prevention, s/s of heart attack and stroke; Reviewed prescribed diet heart healthy diet. Education provided on not eating foods high in potassium right now due to the patient having elevated potassium on blood draw this week. Review of limiting bananas and spinach and other foods rich in potassium.  Reviewed medications with patient and discussed importance of compliance. The patient is compliant with medications, works with pharm D. Review and education provided to hold his potassium that he takes daily at 20 meq. The patient to have new blood work on 04-25-2022 The patient states he has not received his medication [facility] yet. He says it is due around the 12th. He states it should come any day now. Education and support given;  Discussed plans with patient for ongoing care management follow up and provided patient with direct contact information for care management team; Advised patient, providing education and rationale, to monitor blood pressure daily and record, calling PCP for findings outside established parameters;  Reviewed scheduled/upcoming provider appointments including:  08-04-2022 at 9 am Advised patient to discuss changes in heart health, questions or concerns  with provider; Provided education on prescribed diet heart healthy diet. Education on staying hydrated and monitoring foods high in potassium;  Discussed complications of poorly controlled blood pressure such as heart disease, stroke, circulatory complications, vision complications, kidney impairment, sexual dysfunction;  Screening for signs and symptoms of depression related to chronic disease state;  Assessed social determinant of health barriers;   Symptom Management: Take medications as prescribed   Attend all scheduled provider appointments Call provider office for new concerns or questions  call the Suicide and Crisis Lifeline: 988 call the [place] National Suicide Prevention Lifeline: 458-082-0114 or TTY: 787-358-8379 TTY 225-702-0652) to talk to a trained counselor call 1-800-273-TALK (toll free, 24 hour hotline) if experiencing a Mental Health or [place]  check blood pressure weekly learn about high blood pressure call doctor for signs and symptoms of high blood pressure keep all doctor appointments take medications for blood pressure exactly as prescribed report new symptoms to your doctor  Follow Up Plan: Telephone follow up appointment with care management team member scheduled for: 06-30-2022 at 145 pm       CCM:  Maintain, Monitor and Self-Manage Symptoms of COPD       Current Barriers:  Chronic Disease Management support and education needs related to effective management of COPD  Planned Interventions: Provided patient with basic written and  verbal COPD education on self care/management/and exacerbation prevention Advised patient to track and manage COPD triggers. Knows triggers and factors that cause exacerbation. Denies any acute changes in his COPD. Saw pcp on 04-05-2022 Provided written and verbal instructions on pursed lip breathing and utilized returned  demonstration as teach back Provided instruction about proper use of medications used for management of COPD including inhalers. The patient is compliant with medications. Denies any new concerns with medications Advised patient to self assesses COPD action plan zone and make appointment with provider if in the yellow zone for 48 hours without improvement Advised patient to engage in light exercise as tolerated 3-5 days a week to aid in the the management of COPD Provided education about and advised patient to utilize infection prevention strategies to reduce risk of respiratory infection. Education and support provided.  Discussed the importance of adequate rest and management of fatigue with COPD Screening for signs and symptoms of depression related to chronic disease state  Assessed social determinant of health barriers  Symptom Management: Take medications as prescribed   Attend all scheduled provider appointments Call provider office for new concerns or questions  call the Suicide and Crisis Lifeline: 988 call the [place] National Suicide Prevention Lifeline: 610-204-0357 or TTY: 224-100-6382 TTY 863 254 9035) to talk to a trained counselor call 1-800-273-TALK (toll free, 24 hour hotline) if experiencing a Mental Health or [place]  avoid second hand smoke eliminate smoking in my home identify and remove indoor air pollutants limit outdoor activity during cold weather listen for public air quality announcements every day do breathing exercises every day develop a rescue plan eliminate symptom triggers at home follow rescue plan if symptoms flare-up  Follow Up Plan: Telephone follow up appointment with care management team member scheduled for: 06-30-2022 at 145 pm          Patient verbalizes understanding of instructions and care plan provided today and agrees to view in [place]. Active MyChart status and patient understanding of how to access instructions and  care plan via MyChart confirmed with patient.     Telephone follow up appointment with care management team member scheduled for: 06-30-2022 at 145 pm  Food Basics for Chronic Kidney Disease Chronic kidney disease (CKD) is when your kidneys are not working well. They cannot remove waste, fluids, and other substances from your blood the way they should. These substances can build up, which can worsen kidney damage and affect how your body works. Eating certain foods can lead to a buildup of these substances. Changing your diet can help prevent more kidney damage. Diet changes may also delay dialysis or even keep you from needing it. What nutrients should I limit? Work with your treatment team and a food expert (dietitian) to make a meal plan that's right for you. Foods you can eat and foods you should limit or avoid will depend on the stage of your kidney disease and any other health conditions you have. The items listed below are not a complete list. Talk with your dietitian to learn what is best for you. Potassium Potassium affects how steadily your heart beats. Too much potassium in your blood can cause an irregular heartbeat or even a heart attack. You may need to limit foods that are high in potassium, such as: Liquid milk and soy milk. Salt substitutes that contain potassium. Fruits like bananas, apricots, nectarines, melon, prunes, raisins, kiwi, and oranges. Vegetables, such as potatoes, sweet potatoes, yams, tomatoes, leafy greens, beets, avocado, pumpkin, and winter  squash. Beans, like lima beans. Nuts. Phosphorus Phosphorus is a mineral found in your bones. You need a balance between calcium and phosphorus to build and maintain healthy bones. Too much added phosphorus from the foods you eat can pull calcium from your bones. Losing calcium can make your bones weak and more likely to break. Too much phosphorus can also make your skin itch. You may need to limit foods that are high in  phosphorus or that have added phosphorus, such as: Liquid milk and dairy products. Dark-colored sodas or soft drinks. Bran cereals and oatmeal. Protein  Protein helps you make and keep muscle. Protein also helps to repair your body's cells and tissues. One of the natural breakdown products of protein is a waste product called urea. When your kidneys are not working well, they cannot remove types of waste like urea. Reducing protein in your diet can help keep urea from building up in your blood. Depending on your stage of kidney disease, you may need to eat smaller portions of foods that are high in protein. Sources of animal protein include: Meat (all types). Fish and seafood. Poultry. Eggs. Dairy. Other protein foods include: Beans and legumes. Nuts and nut butter. Soy, like tofu.  Sodium Salt (sodium) helps to keep a healthy balance of fluids in your body. Too much salt can increase your blood pressure, which can harm your heart and lungs. Extra salt can also cause your body to keep too much fluid, making your kidneys work harder. You may need to limit or avoid foods that are high in salt, such as: Salt seasonings. Soy and teriyaki sauce. Packaged, precooked, cured, or processed meats, such as sausages or meat loaves. Sardines. Salted crackers and snack foods. Fast food. Canned soups and most canned foods. Pickled foods. Vegetable juice. Boxed mixes or ready-to-eat boxed meals and side dishes. Bottled dressings, sauces, and marinades. Talk with your dietitian about how much potassium, phosphorus, protein, and salt you may have each day. Helpful tips Read food labels  Check the amount of salt in foods. Limit foods that have salt or sodium listed among the first five ingredients. Try to eat low-salt foods. Check the ingredient list for added phosphorus or potassium. "Phos" in an ingredient is a sign that phosphorus has been added. Do not buy foods that are calcium-enriched or  that have calcium added to them (are fortified). Buy canned vegetables and beans that say "no salt added" and rinse them before eating. Lifestyle Limit the amount of protein you eat from animal sources each day. Focus on protein from plant sources, like tofu and dried beans, peas, and lentils. Do not add salt to food when cooking or before eating. Do not eat star fruit. It can be toxic for people with kidney problems. Talk with your health care provider before taking any vitamin or mineral supplements. If told by your health care provider, track how much liquid you drink so you can avoid drinking too much. You may need to include foods you eat that are made mostly from water, like gelatin, ice cream, soups, and juicy fruits and vegetables. If you have diabetes: If you have diabetes (diabetes mellitus) and CKD, you need to keep your blood sugar (glucose) in the target range recommended by your health care provider. Follow your diabetes management plan. This may include: Checking your blood glucose regularly. Taking medicines by mouth, or taking insulin, or both. Exercising for at least 30 minutes on 5 or more days each week, or as told  by your health care provider. Tracking how many servings of carbohydrates you eat at each meal. Not using orange juice to treat low blood sugars. Instead, use apple juice, cranberry juice, or clear soda. You may be given guidelines on what foods and nutrients you may eat, and how much you can have each day. This depends on your stage of kidney disease and whether you have high blood pressure (hypertension). Follow the meal plan your dietitian gives you. To learn more: [employer] of Diabetes and Digestive and Kidney Diseases: [employer]: kidney.org Summary Chronic kidney disease (CKD) is when your kidneys are not working well. They cannot remove waste, fluids, and other substances from your blood the way they should. These  substances can build up, which can worsen kidney damage and affect how your body works. Changing your diet can help prevent more kidney damage. Diet changes may also delay dialysis or even keep you from needing it. Diet changes are different for each person with CKD. Work with a dietitian to set up a meal plan that is right for you. This information is not intended to replace advice given to you by your health care provider. Make sure you discuss any questions you have with your health care provider. Document Revised: 06/17/2021 Document Reviewed: 06/23/2019 Elsevier Patient Education  [place].  Hyperkalemia Hyperkalemia is when you have too much of a mineral called potassium in your blood. If there is too much potassium in your blood, it can affect how your heart works. Potassium is normally removed from your body by your kidneys. What are the causes? Taking in too much potassium. This can happen if: You use salt substitutes. You take potassium supplements. You eat too many foods that are high in potassium if you have kidney disease. Your body not being able to get rid of potassium. This can happen if: Your kidneys are not working properly. You are taking certain medicines. You have a condition called Addison's disease. You have kidney stones. You are on treatment to clean your blood (dialysis) and you skip a treatment. Your cells releasing a high amount of potassium into the blood. This can happen if: You have a muscle injury. You have very bad burns or infections. You have problems with your blood plasma. This can be caused by diabetes that is not well controlled. What increases the risk? Drinking too much alcohol. Using drugs a lot. What are the signs or symptoms? In many cases, there are no symptoms. But, when your potassium level becomes high enough, you may have symptoms such as: An irregular heartbeat. A very slow heartbeat. Feeling like you may vomit  (nauseous). Tiredness. Confusion. Tingling of your skin. Numbness of your hands or feet. Muscle cramps. Muscle weakness. Not being able to move (paralysis). How is this treated? Treatment depends on how bad your condition is. Treatment may need to be done in the hospital. It may include: Receiving a fluid with sugar (glucose) in it through an IV tube, along with insulin. Taking medicines. Having treatment to clean your blood. Taking calcium. Follow these instructions at home:  Take over-the-counter and prescription medicines only as told by your doctor. Do not take any of the following unless your doctor says it is okay: Supplements. Natural food products. Herbs. Vitamins. If you drink alcohol, limit how much you have as told by your doctor. Do not use illegal drugs. If you need help quitting, ask your doctor. If you have kidney disease, you may need to  follow a low-potassium diet. A food expert (dietitian) can help you. Keep all follow-up visits. Contact a doctor if: Your heartbeat is not regular or is very slow. You feel dizzy. You feel weak. You feel like you may vomit. You have tingling in your hands or feet. You lose feeling in your hands or feet. Get help right away if: You are short of breath. You have chest pain. You faint. You cannot move your muscles. These symptoms may be an emergency. Get help right away. Call 911. Do not wait to see if the symptoms will go away. Do not drive yourself to the hospital. Summary Hyperkalemia is when you have too much potassium in your blood. Take over-the-counter and prescription medicines only as told by your doctor. Limit how much alcohol you have as told by your doctor. Contact a doctor if your heartbeat is not regular. This information is not intended to replace advice given to you by your health care provider. Make sure you discuss any questions you have with your health care provider. Document Revised: 11/11/2020 Document  Reviewed: 11/11/2020 Elsevier Patient Education  [place].

## 2022-04-27 ENCOUNTER — Other Ambulatory Visit: Payer: Self-pay

## 2022-04-27 DIAGNOSIS — M25812 Other specified joint disorders, left shoulder: Secondary | ICD-10-CM

## 2022-05-02 ENCOUNTER — Other Ambulatory Visit: Payer: Medicare HMO

## 2022-05-02 DIAGNOSIS — I119 Hypertensive heart disease without heart failure: Secondary | ICD-10-CM | POA: Diagnosis not present

## 2022-05-02 LAB — COMPREHENSIVE METABOLIC PANEL
ALT: 38 IU/L (ref 0–44)
AST: 16 IU/L (ref 0–40)
Albumin/Globulin Ratio: 1.7 (ref 1.2–2.2)
Albumin: 4.5 g/dL (ref 3.8–4.9)
Alkaline Phosphatase: 92 IU/L (ref 44–121)
BUN/Creatinine Ratio: 18 (ref 9–20)
BUN: 26 mg/dL — ABNORMAL HIGH (ref 6–24)
Bilirubin Total: 0.3 mg/dL (ref 0.0–1.2)
CO2: 21 mmol/L (ref 20–29)
Calcium: 9.7 mg/dL (ref 8.7–10.2)
Chloride: 103 mmol/L (ref 96–106)
Creatinine, Ser: 1.43 mg/dL — ABNORMAL HIGH (ref 0.76–1.27)
Globulin, Total: 2.7 g/dL (ref 1.5–4.5)
Glucose: 87 mg/dL (ref 70–99)
Potassium: 5.3 mmol/L — ABNORMAL HIGH (ref 3.5–5.2)
Sodium: 138 mmol/L (ref 134–144)
Total Protein: 7.2 g/dL (ref 6.0–8.5)
eGFR: 57 mL/min/{1.73_m2} — ABNORMAL LOW (ref >59)

## 2022-05-11 ENCOUNTER — Telehealth: Payer: Self-pay

## 2022-05-11 ENCOUNTER — Other Ambulatory Visit: Payer: Self-pay

## 2022-05-11 DIAGNOSIS — E785 Hyperlipidemia, unspecified: Secondary | ICD-10-CM

## 2022-05-11 DIAGNOSIS — I10 Essential (primary) hypertension: Secondary | ICD-10-CM

## 2022-05-11 DIAGNOSIS — J9611 Chronic respiratory failure with hypoxia: Secondary | ICD-10-CM

## 2022-05-11 DIAGNOSIS — J449 Chronic obstructive pulmonary disease, unspecified: Secondary | ICD-10-CM | POA: Diagnosis not present

## 2022-05-11 DIAGNOSIS — I2782 Chronic pulmonary embolism: Secondary | ICD-10-CM

## 2022-05-11 DIAGNOSIS — Z9981 Dependence on supplemental oxygen: Secondary | ICD-10-CM

## 2022-05-11 MED ORDER — APIXABAN 5 MG PO TABS: ORAL_TABLET | ORAL | 1 refills | Status: DC

## 2022-05-11 MED ORDER — CLOPIDOGREL BISULFATE 75 MG PO TABS: 75.0000 mg | ORAL_TABLET | Freq: Every day | ORAL | 1 refills | Status: DC

## 2022-05-11 MED ORDER — ALBUTEROL SULFATE HFA 108 (90 BASE) MCG/ACT IN AERS: INHALATION_SPRAY | RESPIRATORY_TRACT | 2 refills | Status: DC

## 2022-05-11 NOTE — Progress Notes (Signed)
Care Management & Coordination Services Pharmacy Team  Reason for Encounter: Medication coordination and delivery  Contacted patient to discuss medications and coordinate delivery from [facility].  Spoke with patient on 05/11/2022   [facility] dispensing form sent to [name] for review.   Last adherence delivery date:04/24/22      Patient is due for next adherence delivery on: 05/22/22  This delivery to include: Adherence Packaging  30 Days  Eliquis '5mg'$  1 at B and 1 EM Clopidogrel '75mg'$  1 B Vitamin B12 1065mg 1 B Trelegy Inhaler 1055m 1 puff once daily  Metoprolol Tar '50mg'$  1 B and 1 EM Mens Multi 1 at EM Fish Oil '1000mg'$  1 B and 1 EM Pantoprazole '40mg'$  1 B and 1 EM Rosuvastatin '40mg'$  1 EM Repatha Sureclick '140mg'$ - Inject '140mg'$  every 14 days  Albuterol 10876minhaler- Inhale 2 puffs into the lungs every 6 (six) hours as needed  Valsartan '40mg'$  1 B   Patient declined the following medications this month: Potassium Chloride 70m32m/C by Dr. [name] Versie Starksuested from providers include: Eliquis '5mg'$  Clopidogrel '75mg'$   Albuterol 108mc73mhaler  Confirmed delivery date of 05/22/22, advised patient that pharmacy will contact them the morning of delivery.  Any concerns about your medications? No  How often do you forget or accidentally miss a dose? Never  Do you use a pillbox? No  Is patient in packaging Yes  If yes  What is the date on your next pill pack? 05/26/22  Any concerns or issues with your packaging? No concerns    Recent blood pressure readings are as follows:  05/11/22 139/99 59 (Pt just checked while I was on the phone) 04/05/22 130/78  Chart review: Recent office visits:  04/19/22 [name]-Discontinue potassium and then return on Tuesday next week for recheck of Cmp.  Recent consult visits:  None  Hospital visits:  None  Medications: Outpatient Encounter Medications as of 05/11/2022  Medication Sig   albuterol (VENTOLIN HFA) 108 (90  Base) MCG/ACT inhaler INHALE TWO PUFFS BY MOUTH INTO LUNGS every SIX hours AS NEEDED FOR WHEEZING AND/OR SHORTNESS OF BREATH   apixaban (ELIQUIS) 5 MG TABS tablet TAKE ONE TABLET BY MOUTH EVERY MORNING and TAKE ONE TABLET BY MOUTH EVERY EVENING   clopidogrel (PLAVIX) 75 MG tablet Take 1 tablet (75 mg total) by mouth daily.   cyanocobalamin (VITAMIN B12) 1000 MCG tablet TAKE ONE TABLET BY MOUTH EVERY MORNING   Fluticasone-Umeclidin-Vilant (TRELEGY ELLIPTA) 100-62.5-25 MCG/ACT AEPB Inhale 1 puff into the lungs daily.   metoprolol tartrate (LOPRESSOR) 50 MG tablet TAKE ONE TABLET BY MOUTH TWICE DAILY   Multiple Vitamin (MULTIVITAMIN WITH MINERALS) TABS tablet Take 1 tablet by mouth daily.   Omega-3 Fatty Acids (FISH OIL) 1000 MG CAPS Take 1 capsule (1,000 mg total) by mouth 2 (two) times daily.   ondansetron (ZOFRAN) 4 MG tablet Take 1 tablet (4 mg total) by mouth every 8 (eight) hours as needed for nausea or vomiting.   pantoprazole (PROTONIX) 40 MG tablet TAKE ONE TABLET BY MOUTH EVERY MORNING and TAKE ONE TABLET BY MOUTH EVERY EVENING   potassium chloride SA (KLOR-CON M) 20 MEQ tablet TAKE ONE TABLET BY MOUTH ONCE DAILY   REPATHA SURECLICK 140 [id number] SOAJ Inject 140 mg into the skin every 14 (fourteen) days.   rosuvastatin (CRESTOR) 40 MG tablet TAKE ONE TABLET BY MOUTH EVERY EVENING   valsartan (DIOVAN) 40 MG tablet Take 1 tablet (40 mg total) by mouth daily.   No facility-administered  encounter medications on file as of 05/11/2022.   BP Readings from Last 3 Encounters:  04/05/22 130/78  12/01/21 136/80  11/15/21 131/80    Pulse Readings from Last 3 Encounters:  04/05/22 68  12/01/21 (!) 54  11/15/21 72    No results found for: "HGBA1C" Lab Results  Component Value Date   CREATININE 1.43 (H) 05/02/2022   BUN 26 (H) 05/02/2022   GFRNONAA >60 07/23/2021   GFRAA 105 12/15/2019   NA 138 05/02/2022   K 5.3 (H) 05/02/2022   CALCIUM 9.7 05/02/2022   CO2 21 05/02/2022     [name], [place] Clinical Pharmacist Assistant  [phone]

## 2022-05-15 ENCOUNTER — Other Ambulatory Visit: Payer: Self-pay | Admitting: Family Medicine

## 2022-05-16 ENCOUNTER — Other Ambulatory Visit: Payer: Medicare HMO

## 2022-05-16 DIAGNOSIS — E875 Hyperkalemia: Secondary | ICD-10-CM

## 2022-05-16 LAB — COMPREHENSIVE METABOLIC PANEL
ALT: 63 IU/L — ABNORMAL HIGH (ref 0–44)
AST: 29 IU/L (ref 0–40)
Albumin/Globulin Ratio: 1.9 (ref 1.2–2.2)
Albumin: 4.5 g/dL (ref 3.8–4.9)
Alkaline Phosphatase: 93 IU/L (ref 44–121)
BUN/Creatinine Ratio: 16 (ref 9–20)
BUN: 23 mg/dL (ref 6–24)
Bilirubin Total: 0.4 mg/dL (ref 0.0–1.2)
CO2: 21 mmol/L (ref 20–29)
Calcium: 9.9 mg/dL (ref 8.7–10.2)
Chloride: 103 mmol/L (ref 96–106)
Creatinine, Ser: 1.46 mg/dL — ABNORMAL HIGH (ref 0.76–1.27)
Globulin, Total: 2.4 g/dL (ref 1.5–4.5)
Glucose: 82 mg/dL (ref 70–99)
Potassium: 5 mmol/L (ref 3.5–5.2)
Sodium: 139 mmol/L (ref 134–144)
Total Protein: 6.9 g/dL (ref 6.0–8.5)
eGFR: 55 mL/min/{1.73_m2} — ABNORMAL LOW (ref >59)

## 2022-05-19 ENCOUNTER — Other Ambulatory Visit: Payer: Self-pay

## 2022-05-19 DIAGNOSIS — N289 Disorder of kidney and ureter, unspecified: Secondary | ICD-10-CM

## 2022-05-23 ENCOUNTER — Telehealth: Payer: Self-pay | Admitting: Family Medicine

## 2022-05-23 NOTE — Telephone Encounter (Signed)
   Scott Foley has been scheduled for the following appointment:  WHAT: RENAL ULTRASOUND WHERE: [place] DATE: 05/26/22 TIME: 12:30 PM CHECK-IN  Patient has been made aware.

## 2022-05-26 DIAGNOSIS — R944 Abnormal results of kidney function studies: Secondary | ICD-10-CM | POA: Diagnosis not present

## 2022-05-26 DIAGNOSIS — N182 Chronic kidney disease, stage 2 (mild): Secondary | ICD-10-CM | POA: Diagnosis not present

## 2022-05-26 DIAGNOSIS — N289 Disorder of kidney and ureter, unspecified: Secondary | ICD-10-CM | POA: Diagnosis not present

## 2022-06-01 ENCOUNTER — Other Ambulatory Visit: Payer: Self-pay

## 2022-06-01 DIAGNOSIS — N289 Disorder of kidney and ureter, unspecified: Secondary | ICD-10-CM

## 2022-06-12 ENCOUNTER — Other Ambulatory Visit: Payer: Self-pay

## 2022-06-12 ENCOUNTER — Telehealth: Payer: Self-pay

## 2022-06-12 DIAGNOSIS — J449 Chronic obstructive pulmonary disease, unspecified: Secondary | ICD-10-CM

## 2022-06-12 DIAGNOSIS — Z9981 Dependence on supplemental oxygen: Secondary | ICD-10-CM

## 2022-06-12 DIAGNOSIS — J9611 Chronic respiratory failure with hypoxia: Secondary | ICD-10-CM

## 2022-06-12 MED ORDER — TRELEGY ELLIPTA 100-62.5-25 MCG/ACT IN AEPB
1.0000 | INHALATION_SPRAY | Freq: Every day | RESPIRATORY_TRACT | 11 refills | Status: DC
Start: 2022-06-12 — End: 2023-05-15

## 2022-06-12 MED ORDER — TRELEGY ELLIPTA 100-62.5-25 MCG/ACT IN AEPB
1.0000 | INHALATION_SPRAY | Freq: Every day | RESPIRATORY_TRACT | 11 refills | Status: DC
Start: 2022-06-12 — End: 2022-06-12

## 2022-06-12 NOTE — Progress Notes (Signed)
Care Management & Coordination Services Pharmacy Team  Reason for Encounter: Medication coordination and delivery  Contacted patient to discuss medications and coordinate delivery from [facility]. Spoke with patient on 06/12/2022   Cycle dispensing form sent to Scott Foley for review.   Last adherence delivery date:05/22/22      Patient is due for next adherence delivery on: 06/22/22  This delivery to include: Adherence Packaging  30 Days  Eliquis 5mg  1 at B and 1 EM Clopidogrel 75mg  1 B Vitamin B12 1058mcg 1 B Trelegy Inhaler 112mcg 1 puff once daily  Metoprolol Tar 50mg  1 B and 1 EM Mens Multi 1 at EM Fish Oil 1000mg  1 B and 1 EM Pantoprazole 40mg  1 B and 1 EM Rosuvastatin 40mg  1 EM Repatha Sureclick 140mg - Inject 140mg  every 14 days  Albuterol 183mcg inhaler- Inhale 2 puffs into the lungs every 6 (six) hours as needed  Valsartan 40mg  1 B  Patient declined the following medications this month: Zofran-Not using right now  Refills requested from providers include: Trelegy Inhaler 169mcg  Confirmed delivery date of 06/22/22, advised patient that pharmacy will contact them the morning of delivery.   Any concerns about your medications? No  How often do you forget or accidentally miss a dose? Never  Do you use a pillbox? No  Is patient in packaging Yes  If yes  What is the date on your next pill pack? 06/13/22  Any concerns or issues with your packaging? No concerns    Recent blood pressure readings are as follows: Pt does not check everyday 06/11/22 160/70, 04/05/22 130/78  Chart review: Recent office visits:  None  Recent consult visits:  None  Hospital visits:  None  Medications: Outpatient Encounter Medications as of 06/12/2022  Medication Sig   albuterol (VENTOLIN HFA) 108 (90 Base) MCG/ACT inhaler INHALE TWO PUFFS BY MOUTH INTO LUNGS every SIX hours AS NEEDED FOR WHEEZING AND/OR SHORTNESS OF BREATH   apixaban (ELIQUIS) 5 MG TABS tablet TAKE ONE  TABLET BY MOUTH EVERY MORNING and TAKE ONE TABLET BY MOUTH EVERY EVENING   clopidogrel (PLAVIX) 75 MG tablet Take 1 tablet (75 mg total) by mouth daily.   cyanocobalamin (VITAMIN B12) 1000 MCG tablet TAKE ONE TABLET BY MOUTH EVERY MORNING   Fluticasone-Umeclidin-Vilant (TRELEGY ELLIPTA) 100-62.5-25 MCG/ACT AEPB Inhale 1 puff into the lungs daily.   metoprolol tartrate (LOPRESSOR) 50 MG tablet TAKE ONE TABLET BY MOUTH TWICE DAILY   Multiple Vitamin (MULTIVITAMIN WITH MINERALS) TABS tablet Take 1 tablet by mouth daily.   Omega-3 Fatty Acids (FISH OIL) 1000 MG CAPS Take 1 capsule (1,000 mg total) by mouth 2 (two) times daily.   ondansetron (ZOFRAN) 4 MG tablet Take 1 tablet (4 mg total) by mouth every 8 (eight) hours as needed for nausea or vomiting.   pantoprazole (PROTONIX) 40 MG tablet TAKE ONE TABLET BY MOUTH EVERY MORNING and TAKE ONE TABLET BY MOUTH EVERY EVENING   REPATHA SURECLICK [id number] MG/ML SOAJ Inject 140 mg into the skin every 14 (fourteen) days.   rosuvastatin (CRESTOR) 40 MG tablet TAKE ONE TABLET BY MOUTH EVERY EVENING   valsartan (DIOVAN) 40 MG tablet Take 1 tablet (40 mg total) by mouth daily.   No facility-administered encounter medications on file as of 06/12/2022.   BP Readings from Last 3 Encounters:  04/05/22 130/78  12/01/21 136/80  11/15/21 131/80    Pulse Readings from Last 3 Encounters:  04/05/22 68  12/01/21 (!) 54  11/15/21 72    No results  found for: "HGBA1C" Lab Results  Component Value Date   CREATININE 1.46 (H) 05/16/2022   BUN 23 05/16/2022   GFRNONAA >60 07/23/2021   GFRAA 105 12/15/2019   NA 139 05/16/2022   K 5.0 05/16/2022   CALCIUM 9.9 05/16/2022   CO2 21 05/16/2022     [name], [place] Clinical Pharmacist Assistant  [phone]

## 2022-06-14 NOTE — Telephone Encounter (Cosign Needed)
Spoke with pt today and he has been informed I will call him next week to get these numbers.  Scott Foley, [place] Pharmacist Assistant  [phone]

## 2022-06-21 NOTE — Telephone Encounter (Cosign Needed)
06/21/22- Called pt and got BP readings  06/15/22 119/86 78 06/16/22 124/88 82 06/17/22 143/105 85 06/18/22 122/84 76 06/19/22 118/86 72 06/20/22 130/94 69 06/21/22 125/90 73   Roxana Hires, CMA Clinical Lobbyist  [phone]

## 2022-06-27 ENCOUNTER — Telehealth: Payer: Self-pay

## 2022-06-27 NOTE — Progress Notes (Signed)
Care Management & Coordination Services Pharmacy Team  Reason for Encounter: Appointment Reminder  Contacted patient to confirm telephone appointment with Artelia Laroche, PharmD on 06/29/22 at 2:00 pm.  Spoke with patient on 06/27/2022   Do you have any problems getting your medications? No  What is your top health concern you would like to discuss at your upcoming visit? No top concern to discuss  Have you seen any other providers since your last visit with PCP? No   Chart review:  Recent office visits:  None  Recent consult visits:  None  Hospital visits:  None   Star Rating Drugs:  Medication:  Last Fill: Day Supply Rosuvastatin   06/15/22-05/18/22 30ds Valsartan  06/15/22-05/18/22 30ds  Care Gaps: Annual wellness visit in last year? Yes  If Diabetic:None noted  Last eye exam / retinopathy screening: Last diabetic foot exam:   Roxana Hires, [facility] Clinical Pharmacist Assistant  [phone]

## 2022-06-29 ENCOUNTER — Ambulatory Visit: Payer: Medicare HMO

## 2022-06-29 NOTE — Patient Outreach (Signed)
Care Management & Coordination Services Pharmacy Note  06/29/2022 Name:  Scott Foley MRN:  [id number] DOB:  [birth year]  Summary: -Pleasant 57 year old male and his wife Dawm present for initial CCM visit. They were both CNA's. Patient used to love hiking but is unable to do now with his recent hospital problems. He enjoys watching movies, specifically comedies. He has been married for 21 years with 2 Children and 3 dogs. They've been living in a campground for 3 years while they wait to renovate their home. It's been put on pause due to recent medical issues.    Recommendations/Changes made from today's visit: None  Subjective: Scott Foley is an 57 y.o. year old male who is a primary patient of [name], [name], MD.  The care coordination team was consulted for assistance with disease management and care coordination needs.    Engaged with patient by telephone for follow up visit.  Recent office visits:  None   Recent consult visits:  None   Hospital visits:  None   Objective:  Lab Results  Component Value Date   CREATININE 1.46 (H) 05/16/2022   BUN 23 05/16/2022   EGFR 55 (L) 05/16/2022   GFRNONAA >60 07/23/2021   GFRAA 105 12/15/2019   NA 139 05/16/2022   K 5.0 05/16/2022   CALCIUM 9.9 05/16/2022   CO2 21 05/16/2022   GLUCOSE 82 05/16/2022    No results found for: "HGBA1C", "FRUCTOSAMINE", "GFR", "MICROALBUR"  Last diabetic Eye exam: No results found for: "HMDIABEYEEXA"  Last diabetic Foot exam: No results found for: "HMDIABFOOTEX"   Lab Results  Component Value Date   CHOL 70 (L) 04/05/2022   HDL 38 (L) 04/05/2022   LDLCALC 9 04/05/2022   TRIG 129 04/05/2022   CHOLHDL 1.8 04/05/2022       Latest Ref Rng & Units 05/16/2022   11:09 AM 05/02/2022    9:37 AM 04/19/2022   10:00 AM  Hepatic Function  Total Protein 6.0 - 8.5 g/dL 6.9  7.2  7.0   Albumin 3.8 - 4.9 g/dL 4.5  4.5  4.5   AST 0 - 40 IU/L ALT 0 - 44 IU/L 63  38  41   Alk  Phosphatase 44 - 121 IU/L 93  92  98   Total Bilirubin 0.0 - 1.2 mg/dL 0.4  0.3  0.4     Lab Results  Component Value Date/Time   TSH 3.170 04/05/2022 10:05 AM   TSH 1.870 11/25/2020 10:32 AM       Latest Ref Rng & Units 04/05/2022   10:05 AM 12/01/2021    9:15 AM 08/01/2021   11:28 AM  CBC  WBC 3.4 - 10.8 x10E3/uL 11.0  9.5  9.2   Hemoglobin 13.0 - 17.7 g/dL 40.9  81.1  91.4   Hematocrit 37.5 - 51.0 % 45.4  46.2  37.6   Platelets 150 - 450 x10E3/uL 297  285  403     Lab Results  Component Value Date/Time   VD25OH 48.4 07/04/2021 09:43 AM   VITAMINB12 1,217 04/05/2022 10:05 AM   VITAMINB12 1,129 07/04/2021 09:43 AM    Clinical ASCVD: Yes  The ASCVD Risk score (Arnett DK, et al., 2019) failed to calculate for the following reasons:   The valid total cholesterol range is 130 to 320 mg/dL    Other: ([id number] if Afib, MMRC or CAT for COPD, ACT, DEXA)     02/24/2022    2:08  PM 09/12/2021    2:02 PM 06/22/2021    3:16 PM  Depression screen PHQ 2/9  Decreased Interest 0 0 0  Down, Depressed, Hopeless 0 0 0  PHQ - 2 Score 0 0 0     Social History   Tobacco Use  Smoking Status Former   Packs/day: 0.75   Years: 30.00   Additional pack years: 0.00   Total pack years: 22.50   Types: Cigarettes   Start date: 03/14/1979   Quit date: 11/2020   Years since quitting: 1.6  Smokeless Tobacco Never   BP Readings from Last 3 Encounters:  04/05/22 130/78  12/01/21 136/80  11/15/21 131/80   Pulse Readings from Last 3 Encounters:  04/05/22 68  12/01/21 (!) 54  11/15/21 72   Wt Readings from Last 3 Encounters:  04/05/22 212 lb (96.2 kg)  12/01/21 196 lb (88.9 kg)  11/15/21 194 lb 3.2 oz (88.1 kg)   BMI Readings from Last 3 Encounters:  04/05/22 36.39 kg/m  12/01/21 33.64 kg/m  11/15/21 33.33 kg/m    Allergies  Allergen Reactions   Penicillin G Hives    Medications Reviewed Today     Reviewed by Zettie Pho, [facility] (Pharmacist) on 05/12/22 at 0946  Med  List Status: <None>   Medication Order Taking? Sig Documenting Provider Last Dose Status Informant  albuterol (VENTOLIN HFA) 108 (90 Base) MCG/ACT inhaler [id number]  INHALE TWO PUFFS BY MOUTH INTO LUNGS every SIX hours AS NEEDED FOR WHEEZING AND/OR SHORTNESS OF Jeanella Anton, [name], MD  Active   apixaban (ELIQUIS) 5 MG TABS tablet [id number] Yes TAKE ONE TABLET BY MOUTH EVERY MORNING and TAKE ONE TABLET BY MOUTH EVERY EVENING [name], [name], MD Taking Active   clopidogrel (PLAVIX) 75 MG tablet [id number] Yes Take 1 tablet (75 mg total) by mouth daily. [name], [name], MD Taking Active   cyanocobalamin (VITAMIN B12) 1000 MCG tablet [id number] Yes TAKE ONE TABLET BY MOUTH EVERY MORNING [name], [name], MD Taking Active   Fluticasone-Umeclidin-Vilant (TRELEGY ELLIPTA) 100-62.5-25 MCG/ACT AEPB [id number] Yes Inhale 1 puff into the lungs daily. [name], [name], MD Taking Active Self  metoprolol tartrate (LOPRESSOR) 50 MG tablet [id number] Yes TAKE ONE TABLET BY MOUTH TWICE DAILY [name], [name], MD Taking Active   Multiple Vitamin (MULTIVITAMIN WITH MINERALS) TABS tablet [id number] Yes Take 1 tablet by mouth daily. Kathlen Mody, MD Taking Active Self  Omega-3 Fatty Acids (FISH OIL) 1000 MG CAPS [id number]  Take 1 capsule (1,000 mg total) by mouth 2 (two) times daily. [name], [name], MD  Active Self  ondansetron (ZOFRAN) 4 MG tablet [id number]  Take 1 tablet (4 mg total) by mouth every 8 (eight) hours as needed for nausea or vomiting. [name], [name], MD  Active   pantoprazole (PROTONIX) 40 MG tablet [id number] Yes TAKE ONE TABLET BY MOUTH EVERY MORNING and TAKE ONE TABLET BY MOUTH EVERY EVENING [name], [name], MD Taking Active   potassium chloride SA (KLOR-CON M) 20 MEQ tablet [id number] No TAKE ONE TABLET BY MOUTH ONCE DAILY  Patient not taking: Reported on 05/12/2022   Blane Ohara, MD Not Taking Active   REPATHA SURECLICK 140 MG/ML SOAJ [id number] Yes Inject 140 mg into the skin every 14 (fourteen) days. [name], [name], MD Taking Active    rosuvastatin (CRESTOR) 40 MG tablet [id number] Yes TAKE ONE TABLET BY MOUTH EVERY EVENING [name], [name], MD Taking Active   valsartan (DIOVAN) 40 MG tablet [id number] Yes Take 1 tablet (40 mg total) by mouth daily. Blane Ohara, MD Taking Active  SDOH:  (Social Determinants of Health) assessments and interventions performed: Yes SDOH Interventions    Flowsheet Row Care Coordination from 06/29/2022 in [facility] Chronic Care Management from 02/24/2022 in [name] Chronic Care Management from 12/29/2021 in [name] Chronic Care Management from 07/27/2021 in [name] Chronic Care Management from 05/19/2021 in [name]  SDOH Interventions       Food Insecurity Interventions -- Intervention Not Indicated -- -- --  Housing Interventions -- Intervention Not Indicated -- -- --  Transportation Interventions Intervention Not Indicated Intervention Not Indicated Intervention Not Indicated Intervention Not Indicated Intervention Not Indicated  Utilities Interventions -- Intervention Not Indicated -- -- --  Alcohol Usage Interventions -- Intervention Not Indicated (Score <7) -- -- --  Financial Strain Interventions Other (Comment)  [PAP] Other (Comment)  [works with pharm D for PAP] Other (Comment)  [PAP] Other (Comment)  [PAP (See CP)] Other (Comment)  [Sync Meds to be delievered beginning of month (Paid on 3rd of month)]  Physical Activity Interventions -- Intervention Not Indicated -- -- --  Stress Interventions -- Intervention Not Indicated -- -- --  Social Connections Interventions -- Other (Comment)  [has good support system] -- -- --       Name and location of Current pharmacy:  [facility] - [place], [place] [id number] [place] [address] Phone: [phone] Fax: [phone]  [facility] - [address] [place] -  [address] [place] [address] Phone: [phone] Fax: [phone]    Compliance/Adherence/Medication fill history: Star Rating Drugs:  Medication:                Last Fill:         Day Supply Rosuvastatin               06/15/22-05/18/22   30ds Valsartan                     06/15/22-05/18/22   30ds   Care Gaps: Annual wellness visit in last year? Yes   Assessment/Plan   Hypertension (BP goal <130/80) BP Readings from Last 3 Encounters:  04/05/22 130/78  12/01/21 136/80  11/15/21 131/80    Pulse Readings from Last 3 Encounters:  04/05/22 68  12/01/21 (!) 54  11/15/21 72  -Controlled -Current treatment: Metoprolol Tart 50mg  BID Appropriate, Effective, Safe, Accessible Valsartan 40mg  Appropriate, Effective, Safe, Accessible -Medications previously tried: Lisinopril (DC Feb 2023)  -Current home readings:  October 2023: States his BP yesterday was 180 over something 12/29/21: 115/80 when I asked him to test on the phone April 2024 06/15/22 119/86 78 06/16/22 124/88 82 06/17/22 143/105 85 06/18/22 122/84 76 06/19/22 118/86 72 06/20/22 130/94 69 06/21/22 125/90 73 06/29/22: 115/74 Pulse: 70 -Current dietary habits: "Tries to eat healthy" -Current exercise habits: loves to hike but is unable after recent hospitalizations -Denies hypotensive/hypertensive symptoms -Educated on BP goals and benefits of medications for prevention of heart attack, stroke and kidney damage; -Counseled to monitor BP at home 3x/week, document, and provide log at future appointments October 2023: Counseled patient to take/write BP daily for next week. CCM team will f/u then. Counseled him to call office if BP over 180 systolic again   Hx PE (2014) (Goal: Prevent recurrence) -Controlled -Current treatment  Eliquis 5mg  BID Appropriate, Effective, Safe, Accessible 57 y.o. Lab Results  Component  Value Date   CREATININE 1.46 (H) 05/16/2022   Wt Readings from Last 3 Encounters:   04/05/22 212 lb (96.2 kg)  12/01/21 196 lb (88.9 kg)  11/15/21 194 lb 3.2 oz (88.1 kg)  -Medications previously tried: N/A  -Recommended to continue current medication     Hyperlipidemia: (LDL goal < 70) The ASCVD Risk score (Arnett DK, et al., 2019) failed to calculate for the following reasons:   The valid total cholesterol range is 130 to 320 mg/dL Lab Results  Component Value Date   CHOL 70 (L) 04/05/2022   CHOL 73 (L) 12/01/2021   CHOL 101 07/04/2021   Lab Results  Component Value Date   HDL 38 (L) 04/05/2022   HDL 37 (L) 12/01/2021   HDL 26 (L) 07/04/2021   Lab Results  Component Value Date   LDLCALC 9 04/05/2022   LDLCALC 10 12/01/2021   LDLCALC 50 07/04/2021   Lab Results  Component Value Date   TRIG 129 04/05/2022   TRIG 156 (H) 12/01/2021   TRIG 143 07/04/2021   Lab Results  Component Value Date   CHOLHDL 1.8 04/05/2022   CHOLHDL 2.0 12/01/2021   CHOLHDL 3.9 07/04/2021   No results found for: "LDLDIRECT" Last vitamin D Lab Results  Component Value Date   VD25OH 48.4 07/04/2021   Lab Results  Component Value Date   TSH 3.170 04/05/2022   -Controlled -Current treatment: Rosuvastatin 40mg  Appropriate, Effective, Safe, Accessible Repatha 140mg  Appropriate, Effective, Safe, Accessible Fish Oil 1000mg  BID Appropriate, Effective, Safe, Accessible Clopidogrel 75mg  Appropriate, Effective, Safe, Accessible -Medications previously tried: N/A  -Current dietary patterns: "Tries to eat healthy" -Current exercise habits: loves to hike but is unable after recent hospitalizations -Educated on Cholesterol goals;  March 2023: Will try to get patient on Vascepa at future visit, don't want to overwhelm too much today May 2023: Unable to afford Repatha, start PAP ASAP October 2023: Looks like [facility] is sending Repathato patient for $0. Will ask CCM team what program he is in and how to renew for 2024 if needed   COPD (Goal: control symptoms and prevent  exacerbations)    12/29/2021   11:11 AM 07/27/2021   10:55 AM  CAT Score  Total CAT Score 11 9   Pulmonary Functions Testing Results:  No results found for: "FEV1", "FVC", "FEV1FVC", "TLC", "DLCO" -Controlled -Current treatment  Albuterol PRN Appropriate, Effective, Safe, Accessible Trelegy Appropriate, Effective, Safe, Accessible -Medications previously tried: N/A  -Gold Grade: Unknown -Current COPD Classification:  Unknown -MMRC/CAT score: Unknown -Pulmonary function testing: Unknown -Exacerbations requiring treatment in last 6 months: None -Patient denies consistent use of maintenance inhaler -Frequency of rescue inhaler use: PRN March 2023: Patient's main priority was operations of meds in general ([facility]). Unable to get CAT score or do full assessment. Will do at future visit and go more in-depth then. Spoke long enough to know patient is content on therapy May 2023: Conducted CAT score October 2023: CAT score slightly worse but patient states lungs feel normal to him -Looks like [facility] is sending Trelegy to patient for $0. Will ask CCM team what program he is in and how to renew for 2024 if needed April 2024: Conducted CAT today but unable to add to chart. Is a 9. -Patient states he can't remember last time he used Albuterol   GERD (Goal: minimize symptoms of reflux ) -Controlled -Current treatment  Pantoprazole 40mg  Appropriate, Effective, Safe, Accessible -Medications previously tried: none reported  -Triggering factors: N/A -Hx of Bleeds/ulcers:  No -Counseled on small meals, elevating head, and sleeping on left side -Recommended to continue current medication  CP F/U April 2024

## 2022-06-30 ENCOUNTER — Telehealth: Payer: Medicare HMO

## 2022-06-30 ENCOUNTER — Ambulatory Visit (INDEPENDENT_AMBULATORY_CARE_PROVIDER_SITE_OTHER): Payer: Medicare HMO

## 2022-06-30 DIAGNOSIS — J449 Chronic obstructive pulmonary disease, unspecified: Secondary | ICD-10-CM

## 2022-06-30 DIAGNOSIS — E782 Mixed hyperlipidemia: Secondary | ICD-10-CM

## 2022-06-30 DIAGNOSIS — I10 Essential (primary) hypertension: Secondary | ICD-10-CM

## 2022-06-30 NOTE — Patient Instructions (Signed)
Please call the care guide team at [phone] if you need to cancel or reschedule your appointment.   If you are experiencing a Mental Health or Behavioral Health Crisis or need someone to talk to, please call the Suicide and Crisis Lifeline: 988 call the [place] National Suicide Prevention Lifeline: 947-623-8558 or TTY: 905-744-7896 TTY 508-067-1007) to talk to a trained counselor call 1-800-273-TALK (toll free, 24 hour hotline) go to [facility] [address] [place] [phone])   Following is a copy of the CCM Program Consent:  CCM service includes personalized support from designated clinical staff supervised by the physician, including individualized plan of care and coordination with other care providers 24/7 contact phone numbers for assistance for urgent and routine care needs. Service will only be billed when office clinical staff spend 20 minutes or more in a month to coordinate care. Only one practitioner may furnish and bill the service in a calendar month. The patient may stop CCM services at amy time (effective at the end of the month) by phone call to the office staff. The patient will be responsible for cost sharing (co-pay) or up to 20% of the service fee (after annual deductible is met)  Following is a copy of your full provider care plan:   Goals Addressed             This Visit's Progress    CCM Expected Outcome:  Monitor, Self-Manage and Reduce Symptoms of:HLD       Current Barriers:  Care Coordination needs related to medication needs and ongoing support and education from the pharm D for management of medications in a patient with HLD Chronic Disease Management support and education needs related to effective management of HLD   Lab Results  Component Value Date   CHOL 70 (L) 04/05/2022   HDL 38 (L) 04/05/2022   LDLCALC 9 04/05/2022   TRIG 129 04/05/2022   CHOLHDL 1.8 04/05/2022    Planned  Interventions: Provider established cholesterol goals reviewed, Review of labs, education provided ; Counseled on importance of regular laboratory monitoring as prescribed. The patient has labs on a regular basis. Labs are up to date; Provided HLD educational materials; Reviewed role and benefits of statin for ASCVD risk reduction; Discussed strategies to manage statin-induced myalgias. Working with the pharm D on assistance with Repatha. ; Reviewed importance of limiting foods high in cholesterol. Education provided on a heart healthy diet.; Reviewed exercise goals and target of 150 minutes per week; Screening for signs and symptoms of depression related to chronic disease state;  Assessed social determinant of health barriers;  The patient states he is feeling more tired than usual. Discussed staying  hydrated, trying some protein shakes, and eating more fruits and vegetables. Education and support given.   Symptom Management: Take medications as prescribed   Attend all scheduled provider appointments Call provider office for new concerns or questions  call the Suicide and Crisis Lifeline: 988 call the [place] National Suicide Prevention Lifeline: 6162127941 or TTY: 830-016-1020 TTY [phone]) to talk to a trained counselor call 1-800-273-TALK (toll free, 24 hour hotline) if experiencing a Mental Health or Behavioral Health Crisis  - call for medicine refill 2 or 3 days before it runs out - take all medications exactly as prescribed - call doctor with any symptoms you believe are related to your medicine - call doctor when you experience any new symptoms - go to all doctor appointments as scheduled - adhere to prescribed diet: heart healthy  Follow Up Plan: Telephone follow up appointment with care management team member scheduled for: 08-18-2022 at 145 pm       CCM Expected Outcome:  Monitor, Self-Manage, and Reduce Symptoms of Hypertension       Current Barriers:  Chronic  Disease Management support and education needs related to effective management of HTN  BP Readings from Last 3 Encounters:  04/05/22 130/78  12/01/21 136/80  11/15/21 131/80    Planned Interventions: Evaluation of current treatment plan related to hypertension self management and patient's adherence to plan as established by provider. Blood pressures are stable. The patient denies any acute changes in HTN or heart health. The patient denies any changes in his HTN or heart health. Feels like his health and well being are stable at this time.  Provided education to patient re: stroke prevention, s/s of heart attack and stroke; Reviewed prescribed diet heart healthy diet. Education provided on not eating foods high in potassium right now due to the patient having elevated potassium on blood draw this week. Review of limiting bananas and spinach and other foods rich in potassium. Potassium level at a 5.0 on repeat labs. The patient is mindful of foods with potassium Reviewed medications with patient and discussed importance of compliance. The patient is compliant with medications, works with pharm D. Spoke to the pharm D yesterday. The pharm D is working with the patient on medication needs, ongoing support and education. Discussed plans with patient for ongoing care management follow up and provided patient with direct contact information for care management team; Advised patient, providing education and rationale, to monitor blood pressure daily and record, calling PCP for findings outside established parameters;  Reviewed scheduled/upcoming provider appointments including: 08-04-2022 at 9 am Advised patient to discuss changes in heart health, questions or concerns  with provider; Provided education on prescribed diet heart healthy diet. Education on staying hydrated and monitoring foods high in potassium;  Discussed complications of poorly controlled blood pressure such as heart disease, stroke,  circulatory complications, vision complications, kidney impairment, sexual dysfunction;  Screening for signs and symptoms of depression related to chronic disease state;  Assessed social determinant of health barriers;   Symptom Management: Take medications as prescribed   Attend all scheduled provider appointments Call provider office for new concerns or questions  call the Suicide and Crisis Lifeline: 988 call the [place] National Suicide Prevention Lifeline: 820-502-5977 or TTY: 260 467 0260 TTY [phone]) to talk to a trained counselor call 1-800-273-TALK (toll free, 24 hour hotline) if experiencing a Mental Health or Behavioral Health Crisis  check blood pressure weekly learn about high blood pressure call doctor for signs and symptoms of high blood pressure keep all doctor appointments take medications for blood pressure exactly as prescribed report new symptoms to your doctor  Follow Up Plan: Telephone follow up appointment with care management team member scheduled for: 08-18-2022 at 145 pm       CCM:  Maintain, Monitor and Self-Manage Symptoms of COPD       Current Barriers:  Chronic Disease Management support and education needs related to effective management of COPD  Planned Interventions: Provided patient with basic written and verbal COPD education on self care/management/and exacerbation prevention. The patient denies any issues with his COPD at this time. Is compliant with his plan of care Advised patient to track and manage COPD triggers. Knows triggers and factors that cause exacerbation. Denies any acute changes in his COPD. Review of protecting self from high pollen count and also weather  changes that cause exacerbation. Provided written and verbal instructions on pursed lip breathing and utilized returned demonstration as teach back Provided instruction about proper use of medications used for management of COPD including inhalers. The patient is compliant with  medications. Denies any new concerns with medications Advised patient to self assesses COPD action plan zone and make appointment with provider if in the yellow zone for 48 hours without improvement Advised patient to engage in light exercise as tolerated 3-5 days a week to aid in the the management of COPD Provided education about and advised patient to utilize infection prevention strategies to reduce risk of respiratory infection. Education and support provided.  Discussed the importance of adequate rest and management of fatigue with COPD Screening for signs and symptoms of depression related to chronic disease state  Assessed social determinant of health barriers  Symptom Management: Take medications as prescribed   Attend all scheduled provider appointments Call provider office for new concerns or questions  call the Suicide and Crisis Lifeline: 988 call the [place] National Suicide Prevention Lifeline: 223-503-6310 or TTY: 435 438 8302 TTY 9021266371) to talk to a trained counselor call 1-800-273-TALK (toll free, 24 hour hotline) if experiencing a Mental Health or Behavioral Health Crisis  avoid second hand smoke eliminate smoking in my home identify and remove indoor air pollutants limit outdoor activity during cold weather listen for public air quality announcements every day do breathing exercises every day develop a rescue plan eliminate symptom triggers at home follow rescue plan if symptoms flare-up  Follow Up Plan: Telephone follow up appointment with care management team member scheduled for: 08-18-2022 at 145 pm          Patient verbalizes understanding of instructions and care plan provided today and agrees to view in MyChart. Active MyChart status and patient understanding of how to access instructions and care plan via MyChart confirmed with patient.  Telephone follow up appointment with care management team member scheduled for: 08-18-2022 at 145 pm

## 2022-06-30 NOTE — Chronic Care Management (AMB) (Signed)
Chronic Care Management   CCM RN Visit Note  06/30/2022 Name: Scott Foley MRN: [id number] DOB: 19-Dec-1965  Subjective: Scott Foley is a 57 y.o. year old male who is a primary care patient of [name], [name], MD. The patient was referred to the Chronic Care Management team for assistance with care management needs subsequent to provider initiation of CCM services and plan of care.    Today's Visit:  Engaged with patient by telephone for follow up visit.        Goals Addressed             This Visit's Progress    CCM Expected Outcome:  Monitor, Self-Manage and Reduce Symptoms of:HLD       Current Barriers:  Care Coordination needs related to medication needs and ongoing support and education from the pharm D for management of medications in a patient with HLD Chronic Disease Management support and education needs related to effective management of HLD   Lab Results  Component Value Date   CHOL 70 (L) 04/05/2022   HDL 38 (L) 04/05/2022   LDLCALC 9 04/05/2022   TRIG 129 04/05/2022   CHOLHDL 1.8 04/05/2022    Planned Interventions: Provider established cholesterol goals reviewed, Review of labs, education provided ; Counseled on importance of regular laboratory monitoring as prescribed. The patient has labs on a regular basis. Labs are up to date; Provided HLD educational materials; Reviewed role and benefits of statin for ASCVD risk reduction; Discussed strategies to manage statin-induced myalgias. Working with the pharm D on assistance with Repatha. ; Reviewed importance of limiting foods high in cholesterol. Education provided on a heart healthy diet.; Reviewed exercise goals and target of 150 minutes per week; Screening for signs and symptoms of depression related to chronic disease state;  Assessed social determinant of health barriers;  The patient states he is feeling more tired than usual. Discussed staying  hydrated, trying some protein shakes, and  eating more fruits and vegetables. Education and support given.   Symptom Management: Take medications as prescribed   Attend all scheduled provider appointments Call provider office for new concerns or questions  call the Suicide and Crisis Lifeline: 988 call the [place] National Suicide Prevention Lifeline: 408 150 2752 or TTY: (732)463-0740 TTY (561) [phone]) to talk to a trained counselor call 1-800-273-TALK (toll free, 24 hour hotline) if experiencing a Mental Health or Behavioral Health Crisis  - call for medicine refill 2 or 3 days before it runs out - take all medications exactly as prescribed - call doctor with any symptoms you believe are related to your medicine - call doctor when you experience any new symptoms - go to all doctor appointments as scheduled - adhere to prescribed diet: heart healthy  Follow Up Plan: Telephone follow up appointment with care management team member scheduled for: 08-18-2022 at 145 pm       CCM Expected Outcome:  Monitor, Self-Manage, and Reduce Symptoms of Hypertension       Current Barriers:  Chronic Disease Management support and education needs related to effective management of HTN  BP Readings from Last 3 Encounters:  04/05/22 130/78  12/01/21 136/80  11/15/21 131/80    Planned Interventions: Evaluation of current treatment plan related to hypertension self management and patient's adherence to plan as established by provider. Blood pressures are stable. The patient denies any acute changes in HTN or heart health. The patient denies any changes in his HTN or heart health. Feels like his health and well being are  stable at this time.  Provided education to patient re: stroke prevention, s/s of heart attack and stroke; Reviewed prescribed diet heart healthy diet. Education provided on not eating foods high in potassium right now due to the patient having elevated potassium on blood draw this week. Review of limiting bananas and spinach and other  foods rich in potassium. Potassium level at a 5.0 on repeat labs. The patient is mindful of foods with potassium Reviewed medications with patient and discussed importance of compliance. The patient is compliant with medications, works with pharm D. Spoke to the pharm D yesterday. The pharm D is working with the patient on medication needs, ongoing support and education. Discussed plans with patient for ongoing care management follow up and provided patient with direct contact information for care management team; Advised patient, providing education and rationale, to monitor blood pressure daily and record, calling PCP for findings outside established parameters;  Reviewed scheduled/upcoming provider appointments including: 08-04-2022 at 9 am Advised patient to discuss changes in heart health, questions or concerns  with provider; Provided education on prescribed diet heart healthy diet. Education on staying hydrated and monitoring foods high in potassium;  Discussed complications of poorly controlled blood pressure such as heart disease, stroke, circulatory complications, vision complications, kidney impairment, sexual dysfunction;  Screening for signs and symptoms of depression related to chronic disease state;  Assessed social determinant of health barriers;   Symptom Management: Take medications as prescribed   Attend all scheduled provider appointments Call provider office for new concerns or questions  call the Suicide and Crisis Lifeline: 988 call the [place] National Suicide Prevention Lifeline: 802 217 1007 or TTY: 7438277643 TTY [phone]) to talk to a trained counselor call 1-800-273-TALK (toll free, 24 hour hotline) if experiencing a Mental Health or Behavioral Health Crisis  check blood pressure weekly learn about high blood pressure call doctor for signs and symptoms of high blood pressure keep all doctor appointments take medications for blood pressure exactly as  prescribed report new symptoms to your doctor  Follow Up Plan: Telephone follow up appointment with care management team member scheduled for: 08-18-2022 at 145 pm       CCM:  Maintain, Monitor and Self-Manage Symptoms of COPD       Current Barriers:  Chronic Disease Management support and education needs related to effective management of COPD  Planned Interventions: Provided patient with basic written and verbal COPD education on self care/management/and exacerbation prevention. The patient denies any issues with his COPD at this time. Is compliant with his plan of care Advised patient to track and manage COPD triggers. Knows triggers and factors that cause exacerbation. Denies any acute changes in his COPD. Review of protecting self from high pollen count and also weather changes that cause exacerbation. Provided written and verbal instructions on pursed lip breathing and utilized returned demonstration as teach back Provided instruction about proper use of medications used for management of COPD including inhalers. The patient is compliant with medications. Denies any new concerns with medications Advised patient to self assesses COPD action plan zone and make appointment with provider if in the yellow zone for 48 hours without improvement Advised patient to engage in light exercise as tolerated 3-5 days a week to aid in the the management of COPD Provided education about and advised patient to utilize infection prevention strategies to reduce risk of respiratory infection. Education and support provided.  Discussed the importance of adequate rest and management of fatigue with COPD Screening for signs and symptoms of  depression related to chronic disease state  Assessed social determinant of health barriers  Symptom Management: Take medications as prescribed   Attend all scheduled provider appointments Call provider office for new concerns or questions  call the Suicide and Crisis  Lifeline: 988 call the [place] National Suicide Prevention Lifeline: 754-598-4881 or TTY: 815 527 1955 TTY 267-394-4151) to talk to a trained counselor call 1-800-273-TALK (toll free, 24 hour hotline) if experiencing a Mental Health or Behavioral Health Crisis  avoid second hand smoke eliminate smoking in my home identify and remove indoor air pollutants limit outdoor activity during cold weather listen for public air quality announcements every day do breathing exercises every day develop a rescue plan eliminate symptom triggers at home follow rescue plan if symptoms flare-up  Follow Up Plan: Telephone follow up appointment with care management team member scheduled for: 08-18-2022 at 145 pm          Plan:Telephone follow up appointment with care management team member scheduled for:  08-18-2022 at 145 pm  Alto Denver RN, MSN, CCM RN Care Manager  Chronic Care Management Direct Number: [phone]

## 2022-07-11 ENCOUNTER — Telehealth: Payer: Self-pay

## 2022-07-11 DIAGNOSIS — J449 Chronic obstructive pulmonary disease, unspecified: Secondary | ICD-10-CM | POA: Diagnosis not present

## 2022-07-11 DIAGNOSIS — E785 Hyperlipidemia, unspecified: Secondary | ICD-10-CM

## 2022-07-11 DIAGNOSIS — I10 Essential (primary) hypertension: Secondary | ICD-10-CM | POA: Diagnosis not present

## 2022-07-11 NOTE — Progress Notes (Signed)
Care Management & Coordination Services Pharmacy Team  Reason for Encounter: Medication coordination and delivery  Contacted patient to discuss medications and coordinate delivery from [facility].  Spoke with patient on 07/11/2022   Cycle dispensing form sent to [facility] for review.   Last adherence delivery date:06/22/22      Patient is due for next adherence delivery on: Ship on 07/20/22, deliver 07/21/22  This delivery to include: Adherence Packaging  30 Days  Eliquis 5mg  1 at B and 1 EM Clopidogrel 75mg  1 B Vitamin B12 1 B Trelegy Inhaler 1 puff once daily  Metoprolol Tar 50mg  1 B and 1 EM Mens Multi 1 at EM Fish Oil 1000mg  1 B and 1 EM Pantoprazole 40mg  1 B and 1 EM Rosuvastatin 40mg  1 EM Repatha Sureclick 140mg - Inject 140mg  every 14 days  Albuterol inhaler- Inhale 2 puffs into the lungs every 6 (six) hours as needed  Valsartan 40mg  1 B  Patient declined the following medications this month: Zofran-Not using right now   Refills requested from providers include: Valsartan 40mg    Confirmed delivery date of 07/21/22, advised patient that pharmacy will contact them the morning of delivery.   Any concerns about your medications? No  How often do you forget or accidentally miss a dose? Never  Do you use a pillbox? No  Is patient in packaging Yes  If yes  What is the date on your next pill pack? 07/12/22  Any concerns or issues with your packaging? No concerns    Recent blood pressure readings are as follows: 06/29/22: 115/74 Pulse: 70    Chart review: Recent office visits:  None  Recent consult visits:  06/30/22 CCM. Carma Lair [facility] visits:  None  Medications: Outpatient Encounter Medications as of 07/11/2022  Medication Sig   albuterol (VENTOLIN HFA) 108 (90 Base) MCG/ACT inhaler INHALE TWO PUFFS BY MOUTH INTO LUNGS every SIX hours AS NEEDED FOR WHEEZING AND/OR SHORTNESS OF BREATH   apixaban (ELIQUIS) 5 MG TABS  tablet TAKE ONE TABLET BY MOUTH EVERY MORNING and TAKE ONE TABLET BY MOUTH EVERY EVENING   clopidogrel (PLAVIX) 75 MG tablet Take 1 tablet (75 mg total) by mouth daily.   cyanocobalamin (VITAMIN B12) 1000 MCG tablet TAKE ONE TABLET BY MOUTH EVERY MORNING   Fluticasone-Umeclidin-Vilant (TRELEGY ELLIPTA) 100-62.5-25 MCG/ACT AEPB Inhale 1 puff into the lungs daily.   metoprolol tartrate (LOPRESSOR) 50 MG tablet TAKE ONE TABLET BY MOUTH TWICE DAILY   Multiple Vitamin (MULTIVITAMIN WITH MINERALS) TABS tablet Take 1 tablet by mouth daily.   Omega-3 Fatty Acids (FISH OIL) 1000 MG CAPS Take 1 capsule (1,000 mg total) by mouth 2 (two) times daily.   ondansetron (ZOFRAN) 4 MG tablet Take 1 tablet (4 mg total) by mouth every 8 (eight) hours as needed for nausea or vomiting.   pantoprazole (PROTONIX) 40 MG tablet TAKE ONE TABLET BY MOUTH EVERY MORNING and TAKE ONE TABLET BY MOUTH EVERY EVENING   REPATHA SURECLICK 140 MG/ML SOAJ Inject 140 mg into the skin every 14 (fourteen) days.   rosuvastatin (CRESTOR) 40 MG tablet TAKE ONE TABLET BY MOUTH EVERY EVENING   valsartan (DIOVAN) 40 MG tablet Take 1 tablet (40 mg total) by mouth daily.   No facility-administered encounter medications on file as of 07/11/2022.   BP Readings from Last 3 Encounters:  04/05/22 130/78  12/01/21 136/80  11/15/21 131/80    Pulse Readings from Last 3 Encounters:  04/05/22 68  12/01/21 (!) 54  11/15/21 72    No results found for: "HGBA1C" Lab Results  Component Value Date   CREATININE 1.46 (H) 05/16/2022   BUN 23 05/16/2022   GFRNONAA >60 07/23/2021   GFRAA 105 12/15/2019   NA 139 05/16/2022   K 5.0 05/16/2022   CALCIUM 9.9 05/16/2022   CO2 21 05/16/2022     Roxana Hires, CMA Clinical Pharmacist Assistant  [phone]

## 2022-07-12 ENCOUNTER — Other Ambulatory Visit: Payer: Self-pay

## 2022-07-12 MED ORDER — VALSARTAN 40 MG PO TABS: 40.0000 mg | ORAL_TABLET | Freq: Every day | ORAL | 0 refills | Status: DC

## 2022-07-13 DIAGNOSIS — N1832 Chronic kidney disease, stage 3b: Secondary | ICD-10-CM | POA: Diagnosis not present

## 2022-07-13 DIAGNOSIS — N2581 Secondary hyperparathyroidism of renal origin: Secondary | ICD-10-CM | POA: Diagnosis not present

## 2022-07-13 DIAGNOSIS — D631 Anemia in chronic kidney disease: Secondary | ICD-10-CM | POA: Diagnosis not present

## 2022-07-13 DIAGNOSIS — I129 Hypertensive chronic kidney disease with stage 1 through stage 4 chronic kidney disease, or unspecified chronic kidney disease: Secondary | ICD-10-CM | POA: Diagnosis not present

## 2022-07-13 DIAGNOSIS — N1831 Chronic kidney disease, stage 3a: Secondary | ICD-10-CM | POA: Diagnosis not present

## 2022-07-18 DIAGNOSIS — N189 Chronic kidney disease, unspecified: Secondary | ICD-10-CM | POA: Diagnosis not present

## 2022-07-18 DIAGNOSIS — N1831 Chronic kidney disease, stage 3a: Secondary | ICD-10-CM | POA: Diagnosis not present

## 2022-08-03 ENCOUNTER — Other Ambulatory Visit: Payer: Self-pay | Admitting: Family Medicine

## 2022-08-03 DIAGNOSIS — Z9981 Dependence on supplemental oxygen: Secondary | ICD-10-CM

## 2022-08-03 DIAGNOSIS — J9611 Chronic respiratory failure with hypoxia: Secondary | ICD-10-CM

## 2022-08-03 DIAGNOSIS — J449 Chronic obstructive pulmonary disease, unspecified: Secondary | ICD-10-CM

## 2022-08-03 NOTE — Progress Notes (Unsigned)
Subjective:  Patient ID: Scott Foley, male    DOB: [birth year]  Age: 57 y.o. MRN: [id number]  Chief Complaint  Patient presents with   Medical Management of Chronic Issues    HPI Hypertension with stage 3B CKD: Patient is taking valsartan 40 mg daily, Metoprolol 50 mg twice a day. Saw Dr. Allena Katz.    CAD: He is on Plavix 75 mg daily, metoprolol 50 mg twice daily, rosuvastatin 40  mg daily at bedtime.  History of PE/DVT. Numerous. On eliquis 5 mg twice daily.   Hyperlipidemia: Rosuvastatin 40 mg daily at bedtime, Repatha 140 mg every 14 days, Fish Oil 1000 mg twice a day.  COPD:  Trelegy 1 puff daily, Albuterol 108 mcg inhaler 2 puff every 6 hours PRN.  GERD: Pantoprazole 40 mg twice daily.   Patient is eating healthy, but not exercising.  Complaining of constant fatigue. No energy. Sleeps 8 hours a night. Patient snores. Unsure if has apnea episodes at night. Daytime somnolence. Has not fallen asleep while driving.   B12 deficiency : on B12 1000 mcg once daily     08/04/2022    9:10 AM 02/24/2022    2:08 PM 09/12/2021    2:02 PM 06/22/2021    3:16 PM 12/30/2020    1:37 PM  Depression screen PHQ 2/9  Decreased Interest 0 0 0 0 0  Down, Depressed, Hopeless 0 0 0 0 0  PHQ - 2 Score 0 0 0 0 0  Altered sleeping 0      Tired, decreased energy 2      Change in appetite 0      Feeling bad or failure about yourself  0      Trouble concentrating 0      Moving slowly or fidgety/restless 0      Suicidal thoughts 0      PHQ-9 Score 2      Difficult doing work/chores Not difficult at all            08/04/2022    9:10 AM  Fall Risk   Falls in the past year? 0  Number falls in past yr: 0  Injury with Fall? 0  Risk for fall due to : No Fall Risks  Follow up Falls evaluation completed    Patient Care Team: Blane Ohara, MD as PCP - General (Family Medicine) Croitoru, Rachelle Hora, MD as PCP - Cardiology (Cardiology) Zettie Pho, [facility] as Pharmacist (Pharmacist) Marlowe Sax, RN as Case Manager (General Practice)   Review of Systems  Constitutional:  Positive for fatigue. Negative for chills and fever.  HENT:  Negative for congestion, ear pain and sore throat.   Respiratory:  Negative for cough and shortness of breath.   Cardiovascular:  Negative for chest pain.  Gastrointestinal:  Negative for abdominal pain, constipation, diarrhea, nausea and vomiting.  Endocrine: Negative for polydipsia, polyphagia and polyuria.  Genitourinary:  Negative for dysuria and frequency.  Musculoskeletal:  Negative for arthralgias and myalgias.  Neurological:  Negative for dizziness and headaches.  Psychiatric/Behavioral:  Negative for dysphoric mood.        No dysphoria    Current Outpatient Medications on File Prior to Visit  Medication Sig Dispense Refill   albuterol (VENTOLIN HFA) 108 (90 Base) MCG/ACT inhaler INHALE TWO PUFFS BY MOUTH INTO LUNGS every SIX hours AS NEEDED qhzsob 8.5 g 2   apixaban (ELIQUIS) 5 MG TABS tablet TAKE ONE TABLET BY MOUTH EVERY MORNING and TAKE ONE TABLET BY MOUTH  EVERY EVENING 180 tablet 1   clopidogrel (PLAVIX) 75 MG tablet Take 1 tablet (75 mg total) by mouth daily. 90 tablet 1   cyanocobalamin (VITAMIN B12) 1000 MCG tablet TAKE ONE TABLET BY MOUTH EVERY MORNING 90 tablet 3   Fluticasone-Umeclidin-Vilant (TRELEGY ELLIPTA) 100-62.5-25 MCG/ACT AEPB Inhale 1 puff into the lungs daily. 1 each 11   metoprolol tartrate (LOPRESSOR) 50 MG tablet TAKE ONE TABLET BY MOUTH TWICE DAILY 180 tablet 1   Multiple Vitamin (MULTIVITAMIN WITH MINERALS) TABS tablet Take 1 tablet by mouth daily.     Omega-3 Fatty Acids (FISH OIL) 1000 MG CAPS Take 1 capsule (1,000 mg total) by mouth 2 (two) times daily. 180 capsule 0   ondansetron (ZOFRAN) 4 MG tablet Take 1 tablet (4 mg total) by mouth every 8 (eight) hours as needed for nausea or vomiting. 20 tablet 0   pantoprazole (PROTONIX) 40 MG tablet TAKE ONE TABLET BY MOUTH EVERY MORNING and TAKE ONE TABLET BY MOUTH  EVERY EVENING 180 tablet 1   rosuvastatin (CRESTOR) 40 MG tablet TAKE ONE TABLET BY MOUTH EVERY EVENING 90 tablet 1   No current facility-administered medications on file prior to visit.   Past Medical History:  Diagnosis Date   Acquired thrombophilia (HCC) 12/06/2020   Acute pancreatitis after endoscopic retrograde cholangiopancreatography (ERCP) 03/03/2021   ERCP by Dr Braulio Conte in [place] 03/03/2021   B12 deficiency 07/04/2021   Chronic obstructive pulmonary disease (HCC) 07/16/2018   CKD (chronic kidney disease), stage II 07/04/2021   Colon polyp 10/25/2020   serrated adenoma.   Coronary artery disease due to lipid rich plaque 01/01/2021   Coronary artery disease with stable angina pectoris [facility])    AMI No heart cath, treated medically [facility]   Diverticulosis of colon 10/25/2020   Noted on colnoscopy by Dr Tobi Bastos in [place].   DNR (do not resuscitate) 03/03/2019   Encounter for hepatitis C screening test for low risk patient 07/04/2021   Essential hypertension 02/15/2021   History of blood clotting disorder 06/11/2021   History of CVA (cerebrovascular accident) 07/16/2018   Formatting of this note might be different from the original. CVA 3 years ago left with short term memory deficits. Has been on Plavix since.   History of pulmonary embolism 02/15/2021   Hyperlipemia    Hypertension    Hypertensive heart disease without congestive heart failure 12/06/2020   Hypoglycemia 04/15/2021   Hypokalemia 04/13/2021   Hypomagnesemia 06/11/2021   Intra-abdominal fluid collection 07/18/2021   Medication monitoring encounter 06/24/2021   Morbid obesity (HCC) 12/06/2020   BMI 35 with serious comorbidities including CAD, HTN, STROKE, Hyperlipidemia.   Normocytic anemia 04/15/2021   Pericardial effusion 04/29/2021   Pericardial tamponade    Protein-calorie malnutrition, severe 04/15/2021   Pulmonary nodules/lesions, multiple 11/25/2012   9/15/2014CT Chest : small nodule RUL LUL. Stable since 03/2012  Smoking cessation advised and Rx chantix Repeat CT Chest 4 /21/15: subpleural lymph nodes, likely benign repeat one year 2016>>>no change, considered benign Cleda Daub 11/25/2012  NORMAL   [place], post-stroke 07/28/2019   VRE (vancomycin-resistant Enterococci) infection 05/11/2021   Past Surgical History:  Procedure Laterality Date   COLONOSCOPY WITH PROPOFOL N/A 10/25/2020   Wyline Mood, MD, at [facility]. 25 MM serrated adenoma polyp at ascending colon removed and site clipped.  Pan diverticulosis.   IR CATHETER TUBE CHANGE  09/22/2021   IR CATHETER TUBE CHANGE  10/10/2021   IR RADIOLOGIST EVAL & MGMT  05/25/2021   IR RADIOLOGIST EVAL & MGMT  08/09/2021  IR RADIOLOGIST EVAL & MGMT  08/23/2021   IR RADIOLOGIST EVAL & MGMT  09/21/2021   IR RADIOLOGIST EVAL & MGMT  10/07/2021   LAPAROSCOPIC CHOLECYSTECTOMY  02/2021   PERICARDIOCENTESIS N/A 05/01/2021   Procedure: PERICARDIOCENTESIS;  Surgeon: [place], Peter M, MD;  [facility];  Service: Cardiovascular;  Laterality: N/A;   TONSILLECTOMY      Family History  Problem Relation Age of Onset   Hypertension Father    Heart attack Father    Stroke Father    Prostate cancer Father    Hypertension Sister    Lung cancer Sister    Cancer Brother        lung   Hypertension Brother    Heart attack Brother    Social History   Socioeconomic History   Marital status: Married    Spouse name: Not on file   Number of children: Not on file   Years of education: Not on file   Highest education level: GED or equivalent  Occupational History   Not on file  Tobacco Use   Smoking status: Former    Packs/day: 0.75    Years: 30.00    Additional pack years: 0.00    Total pack years: 22.50    Types: Cigarettes    Start date: 03/14/1979    Quit date: 11/2020    Years since quitting: 1.7   Smokeless tobacco: Never  Vaping Use   Vaping Use: Former   Start date: 07/28/2018  Substance and Sexual Activity   Alcohol use: No   Drug use: No   Sexual  activity: Yes    Partners: Female  Other Topics Concern   Not on file  Social History Narrative   Not on file   Social Determinants of Health   Financial Resource Strain: Low Risk  (08/03/2022)   Overall Financial Resource Strain (CARDIA)    Difficulty of Paying Living Expenses: Not hard at all  Recent Concern: Financial Resource Strain - Medium Risk (06/29/2022)   Overall Financial Resource Strain (CARDIA)    Difficulty of Paying Living Expenses: Somewhat hard  Food Insecurity: No Food Insecurity (08/03/2022)   Hunger Vital Sign    Worried About Running Out of Food in the Last Year: Never true    Ran Out of Food in the Last Year: Never true  Transportation Needs: No Transportation Needs (08/03/2022)   PRAPARE - Administrator, Civil Service (Medical): No    Lack of Transportation (Non-Medical): No  Physical Activity: Insufficiently Active (08/03/2022)   Exercise Vital Sign    Days of Exercise per Week: 1 day    Minutes of Exercise per Session: 10 min  Stress: No Stress Concern Present (08/03/2022)   [employer] of Occupational Health - Occupational Stress Questionnaire    Feeling of Stress : Not at all  Social Connections: Moderately Isolated (08/03/2022)   Social Connection and Isolation Panel [NHANES]    Frequency of Communication with Friends and Family: Three times a week    Frequency of Social Gatherings with Friends and Family: Once a week    Attends Religious Services: Never    Database administrator or Organizations: No    Attends Engineer, structural: Never    Marital Status: Married    Objective:  BP 132/82   Pulse 73   Temp (!) 97.1 F (36.2 C)   Ht 5\' 4"  (1.626 m)   Wt 221 lb (100.2 kg)   SpO2 99%  BMI 37.93 kg/m      08/04/2022    9:09 AM 04/05/2022    9:05 AM 12/01/2021    8:17 AM  BP/Weight  Systolic BP 132 130 136  Diastolic BP 82 78 80  Wt. (Lbs) 221 212 196  BMI 37.93 kg/m2 36.39 kg/m2 33.64 kg/m2    Physical  Exam Vitals reviewed.  Constitutional:      Appearance: Normal appearance. He is obese.  Neck:     Vascular: No carotid bruit.  Cardiovascular:     Rate and Rhythm: Normal rate and regular rhythm.     Pulses: Normal pulses.     Heart sounds: Normal heart sounds.  Pulmonary:     Effort: Pulmonary effort is normal.     Breath sounds: Normal breath sounds. No wheezing, rhonchi or rales.  Abdominal:     General: Bowel sounds are normal.     Palpations: Abdomen is soft.     Tenderness: There is no abdominal tenderness.  Neurological:     Mental Status: He is alert.  Psychiatric:        Mood and Affect: Mood normal.        Behavior: Behavior normal.     Diabetic Foot Exam - Simple   No data filed      Lab Results  Component Value Date   WBC 12.6 (H) 08/04/2022   HGB 16.3 08/04/2022   HCT 48.7 08/04/2022   PLT 313 08/04/2022   GLUCOSE 92 08/04/2022   CHOL 55 (L) 08/04/2022   TRIG 107 08/04/2022   HDL 33 (L) 08/04/2022   LDLCALC 2 08/04/2022   ALT 37 08/04/2022   AST 22 08/04/2022   NA 141 08/04/2022   K 4.9 08/04/2022   CL 104 08/04/2022   CREATININE 1.46 (H) 08/04/2022   BUN 24 08/04/2022   CO2 20 08/04/2022   TSH 3.170 04/05/2022   INR 1.2 07/21/2021      Assessment & Plan:    Hypertensive heart disease without congestive heart failure Assessment & Plan: Increase valsartan 80 mg daily.  Orders: -     CBC with Differential/Platelet -     Comprehensive metabolic panel -     Valsartan; Take 1 tablet (80 mg total) by mouth daily.  Dispense: 90 tablet; Refill: 3  Mixed hyperlipidemia Assessment & Plan: Well controlled.  No changes to medicines. Rosuvastatin 40 mg daily at bedtime, Repatha 140 mg every 14 days, Fish Oil 1000 mg twice a day.  Continue to work on eating a healthy diet and exercise.  Labs drawn today.    Orders: -     Lipid panel  Daytime somnolence Assessment & Plan: Ordered Home sleep test  Orders: -     Home sleep test;  Future  Coronary artery disease of native artery of native heart with stable angina pectoris [facility]) Assessment & Plan: Well controlled.  No changes to medicines. Rosuvastatin 40 mg daily at bedtime, Repatha 140 mg every 14 days, Fish Oil 1000 mg twice a day. Continue plavix. Continue to work on eating a healthy diet and exercise.  Labs drawn today.     Simple chronic bronchitis (HCC) Assessment & Plan: The current medical regimen is effective;  continue present plan and medications.  Trelegy 1 puff daily, Albuterol 108 mcg inhaler 2 puff every 6 hours PRN.    Acquired thrombophilia (HCC) Assessment & Plan: On eliquis due to history of recurrent dvt/PEs.   Class 2 severe obesity due to excess calories with serious  comorbidity and body mass index (BMI) of 37.0 to 37.9 in adult [facility]) Assessment & Plan: Recommend continue to work on eating healthy diet and exercise.    Other orders -     Cardiovascular Risk Assessment     Meds ordered this encounter  Medications   valsartan (DIOVAN) 80 MG tablet    Sig: Take 1 tablet (80 mg total) by mouth daily.    Dispense:  90 tablet    Refill:  3    Orders Placed This Encounter  Procedures   CBC with Differential/Platelet   Comprehensive metabolic panel   Lipid panel   Cardiovascular Risk Assessment   Home sleep test     Follow-up: Return in about 3 months (around 11/04/2022) for chronic fasting.   I,Marla I Leal-Borjas,acting as a scribe for Blane Ohara, MD.,have documented all relevant documentation on the behalf of Blane Ohara, MD,as directed by  Blane Ohara, MD while in the presence of Blane Ohara, MD.   An After Visit Summary was printed and given to the patient.  I attest that I have reviewed this visit and agree with the plan scribed by my staff.   Blane Ohara, MD [facility] [phone]

## 2022-08-04 ENCOUNTER — Ambulatory Visit (INDEPENDENT_AMBULATORY_CARE_PROVIDER_SITE_OTHER): Payer: Medicare HMO | Admitting: Family Medicine

## 2022-08-04 VITALS — BP 132/82 | HR 73 | Temp 97.1°F | Ht 64.0 in | Wt 221.0 lb

## 2022-08-04 DIAGNOSIS — E782 Mixed hyperlipidemia: Secondary | ICD-10-CM

## 2022-08-04 DIAGNOSIS — I119 Hypertensive heart disease without heart failure: Secondary | ICD-10-CM

## 2022-08-04 DIAGNOSIS — Z6837 Body mass index (BMI) 37.0-37.9, adult: Secondary | ICD-10-CM | POA: Diagnosis not present

## 2022-08-04 DIAGNOSIS — I25118 Atherosclerotic heart disease of native coronary artery with other forms of angina pectoris: Secondary | ICD-10-CM

## 2022-08-04 DIAGNOSIS — E66812 Obesity, class 2: Secondary | ICD-10-CM

## 2022-08-04 DIAGNOSIS — R400 Somnolence: Secondary | ICD-10-CM

## 2022-08-04 DIAGNOSIS — D6869 Other thrombophilia: Secondary | ICD-10-CM

## 2022-08-04 DIAGNOSIS — J410 Simple chronic bronchitis: Secondary | ICD-10-CM | POA: Diagnosis not present

## 2022-08-04 HISTORY — DX: Somnolence: R40.0

## 2022-08-04 MED ORDER — VALSARTAN 80 MG PO TABS
80.0000 mg | ORAL_TABLET | Freq: Every day | ORAL | 3 refills | Status: DC
Start: 2022-08-04 — End: 2023-08-30

## 2022-08-04 NOTE — Patient Instructions (Signed)
Increase valsartan 80 mg daily.

## 2022-08-05 LAB — COMPREHENSIVE METABOLIC PANEL
ALT: 37 IU/L (ref 0–44)
AST: 22 IU/L (ref 0–40)
Albumin/Globulin Ratio: 1.7 (ref 1.2–2.2)
Albumin: 4.7 g/dL (ref 3.8–4.9)
Alkaline Phosphatase: 88 IU/L (ref 44–121)
BUN/Creatinine Ratio: 16 (ref 9–20)
BUN: 24 mg/dL (ref 6–24)
Bilirubin Total: 0.3 mg/dL (ref 0.0–1.2)
CO2: 20 mmol/L (ref 20–29)
Calcium: 10.4 mg/dL — ABNORMAL HIGH (ref 8.7–10.2)
Chloride: 104 mmol/L (ref 96–106)
Creatinine, Ser: 1.46 mg/dL — ABNORMAL HIGH (ref 0.76–1.27)
Globulin, Total: 2.8 g/dL (ref 1.5–4.5)
Glucose: 92 mg/dL (ref 70–99)
Potassium: 4.9 mmol/L (ref 3.5–5.2)
Sodium: 141 mmol/L (ref 134–144)
Total Protein: 7.5 g/dL (ref 6.0–8.5)
eGFR: 55 mL/min/{1.73_m2} — ABNORMAL LOW (ref >59)

## 2022-08-05 LAB — CBC WITH DIFFERENTIAL/PLATELET
Basophils Absolute: 0.1 10*3/uL (ref 0.0–0.2)
Basos: 1 %
EOS (ABSOLUTE): 0.2 10*3/uL (ref 0.0–0.4)
Eos: 1 %
Hematocrit: 48.7 % (ref 37.5–51.0)
Hemoglobin: 16.3 g/dL (ref 13.0–17.7)
Immature Grans (Abs): 0 10*3/uL (ref 0.0–0.1)
Immature Granulocytes: 0 %
Lymphocytes Absolute: 4.1 10*3/uL — ABNORMAL HIGH (ref 0.7–3.1)
Lymphs: 32 %
MCH: 30.3 pg (ref 26.6–33.0)
MCHC: 33.5 g/dL (ref 31.5–35.7)
MCV: 91 fL (ref 79–97)
Monocytes Absolute: 0.9 10*3/uL (ref 0.1–0.9)
Monocytes: 7 %
Neutrophils Absolute: 7.3 10*3/uL — ABNORMAL HIGH (ref 1.4–7.0)
Neutrophils: 59 %
Platelets: 313 10*3/uL (ref 150–450)
RBC: 5.38 x10E6/uL (ref 4.14–5.80)
RDW: 13.2 % (ref 11.6–15.4)
WBC: 12.6 10*3/uL — ABNORMAL HIGH (ref 3.4–10.8)

## 2022-08-05 LAB — LIPID PANEL
Chol/HDL Ratio: 1.7 ratio (ref 0.0–5.0)
Cholesterol, Total: 55 mg/dL — ABNORMAL LOW (ref 100–199)
HDL: 33 mg/dL — ABNORMAL LOW (ref >39)
LDL Chol Calc (NIH): 2 mg/dL (ref 0–99)
Triglycerides: 107 mg/dL (ref 0–149)
VLDL Cholesterol Cal: 20 mg/dL (ref 5–40)

## 2022-08-06 ENCOUNTER — Other Ambulatory Visit: Payer: Self-pay | Admitting: Family Medicine

## 2022-08-06 NOTE — Assessment & Plan Note (Signed)
>>  ASSESSMENT AND PLAN FOR HYPERLIPEMIA WRITTEN ON 08/06/2022 10:12 PM BY LEAL-BORJAS, [place] Kestner I, CMA  Well controlled.  No changes to medicines. Rosuvastatin  40 mg daily at bedtime, Repatha  140 mg every 14 days, Fish Oil  1000 mg twice a day.  Continue to work on eating a healthy diet and exercise.  Labs drawn today.

## 2022-08-06 NOTE — Assessment & Plan Note (Signed)
The current medical regimen is effective;  continue present plan and medications.  Trelegy 1 puff daily, Albuterol 108 mcg inhaler 2 puff every 6 hours PRN.  

## 2022-08-06 NOTE — Assessment & Plan Note (Signed)
On eliquis due to history of recurrent dvt/PEs. 

## 2022-08-06 NOTE — Assessment & Plan Note (Signed)
Recommend continue to work on eating healthy diet and exercise.  

## 2022-08-06 NOTE — Assessment & Plan Note (Signed)
Well controlled.  No changes to medicines. Rosuvastatin 40 mg daily at bedtime, Repatha 140 mg every 14 days, Fish Oil 1000 mg twice a day.  Continue to work on eating a healthy diet and exercise.  Labs drawn today.   

## 2022-08-06 NOTE — Assessment & Plan Note (Signed)
Ordered Home sleep test 

## 2022-08-06 NOTE — Assessment & Plan Note (Signed)
Well controlled.  No changes to medicines. Rosuvastatin 40 mg daily at bedtime, Repatha 140 mg every 14 days, Fish Oil 1000 mg twice a day. Continue plavix. Continue to work on eating a healthy diet and exercise.  Labs drawn today.   

## 2022-08-06 NOTE — Assessment & Plan Note (Signed)
Increase valsartan 80 mg daily.  

## 2022-08-07 ENCOUNTER — Encounter: Payer: Self-pay | Admitting: Family Medicine

## 2022-08-08 ENCOUNTER — Other Ambulatory Visit: Payer: Self-pay

## 2022-08-08 DIAGNOSIS — D729 Disorder of white blood cells, unspecified: Secondary | ICD-10-CM

## 2022-08-11 ENCOUNTER — Telehealth: Payer: Self-pay

## 2022-08-11 NOTE — Progress Notes (Signed)
Care Management & Coordination Services Pharmacy Team  Reason for Encounter: Medication coordination and delivery  Contacted patient to discuss medications and coordinate delivery from [facility].  Spoke with patient on 08/11/2022   Cycle dispensing form sent to Billee Cashing for review.   Last adherence delivery date: 07/21/22      Patient is due for next adherence delivery on: 08/21/22  This delivery to include: Adherence Packaging  30 Days  Eliquis 5mg  1 at B and 1 EM Clopidogrel 75mg  1 B Vitamin B12 1 B Trelegy Inhaler 1 puff once daily  Metoprolol Tar 50mg  1 B and 1 EM Mens Multi 1 at EM Fish Oil 1000mg  1 B and 1 EM Pantoprazole 40mg  1 B and 1 EM Rosuvastatin 40mg  1 EM Repatha Sureclick 140mg - Inject 140mg  every 14 days  Valsartan 80mg  1 B  Patient declined the following medications this month: Zofran-Not using right now  Albuterol inhaler-Only uses prn, does not need   No refill request needed.  Confirmed delivery date of 08/21/22, advised patient that pharmacy will contact them the morning of delivery.  Any concerns about your medications? No  How often do you forget or accidentally miss a dose? Never  Do you use a pillbox? No  Is patient in packaging Yes  If yes  What is the date on your next pill pack? 08/12/22  Any concerns or issues with your packaging? No concerns   Recent blood pressure readings are as follows: 08/10/22 113/70  Recent blood glucose readings are as follows:N/A   Chart review: Recent office visits:  08/04/22 Blane Ohara MD. Seen for follow up. Increased Valsartan from 40mg  to 80mg  daily.   Recent consult visits:  None  Hospital visits:  None  Medications: Outpatient Encounter Medications as of 08/11/2022  Medication Sig   albuterol (VENTOLIN HFA) 108 (90 Base) MCG/ACT inhaler INHALE TWO PUFFS BY MOUTH INTO LUNGS every SIX hours AS NEEDED qhzsob   apixaban (ELIQUIS) 5 MG TABS tablet TAKE ONE TABLET BY  MOUTH EVERY MORNING and TAKE ONE TABLET BY MOUTH EVERY EVENING   clopidogrel (PLAVIX) 75 MG tablet Take 1 tablet (75 mg total) by mouth daily.   cyanocobalamin (VITAMIN B12) 1000 MCG tablet TAKE ONE TABLET BY MOUTH EVERY MORNING   Fluticasone-Umeclidin-Vilant (TRELEGY ELLIPTA) 100-62.5-25 MCG/ACT AEPB Inhale 1 puff into the lungs daily.   metoprolol tartrate (LOPRESSOR) 50 MG tablet TAKE ONE TABLET BY MOUTH TWICE DAILY   Multiple Vitamin (MULTIVITAMIN WITH MINERALS) TABS tablet Take 1 tablet by mouth daily.   Omega-3 Fatty Acids (FISH OIL) 1000 MG CAPS Take 1 capsule (1,000 mg total) by mouth 2 (two) times daily.   ondansetron (ZOFRAN) 4 MG tablet Take 1 tablet (4 mg total) by mouth every 8 (eight) hours as needed for nausea or vomiting.   pantoprazole (PROTONIX) 40 MG tablet TAKE ONE TABLET BY MOUTH EVERY MORNING and TAKE ONE TABLET BY MOUTH EVERY EVENING   REPATHA SURECLICK 140 MG/ML SOAJ Inject 140 mg into the skin every 14 (fourteen) days.   rosuvastatin (CRESTOR) 40 MG tablet TAKE ONE TABLET BY MOUTH EVERY EVENING   valsartan (DIOVAN) 80 MG tablet Take 1 tablet (80 mg total) by mouth daily.   No facility-administered encounter medications on file as of 08/11/2022.   BP Readings from Last 3 Encounters:  08/04/22 132/82  04/05/22 130/78  12/01/21 136/80    Pulse Readings from Last 3 Encounters:  08/04/22 73  04/05/22 68  12/01/21 (!) 54  No results found for: "HGBA1C" Lab Results  Component Value Date   CREATININE 1.46 (H) 08/04/2022   BUN 24 08/04/2022   GFRNONAA >60 07/23/2021   GFRAA 105 12/15/2019   NA 141 08/04/2022   K 4.9 08/04/2022   CALCIUM 10.4 (H) 08/04/2022   CO2 20 08/04/2022     Roxana Hires, CMA Clinical Pharmacist Assistant  [phone]

## 2022-08-16 DIAGNOSIS — R0602 Shortness of breath: Secondary | ICD-10-CM | POA: Diagnosis not present

## 2022-08-16 DIAGNOSIS — G4733 Obstructive sleep apnea (adult) (pediatric): Secondary | ICD-10-CM | POA: Diagnosis not present

## 2022-08-18 ENCOUNTER — Ambulatory Visit (INDEPENDENT_AMBULATORY_CARE_PROVIDER_SITE_OTHER): Payer: Medicare HMO

## 2022-08-18 ENCOUNTER — Telehealth: Payer: Medicare HMO

## 2022-08-18 DIAGNOSIS — E782 Mixed hyperlipidemia: Secondary | ICD-10-CM

## 2022-08-18 DIAGNOSIS — J449 Chronic obstructive pulmonary disease, unspecified: Secondary | ICD-10-CM

## 2022-08-18 DIAGNOSIS — I119 Hypertensive heart disease without heart failure: Secondary | ICD-10-CM

## 2022-08-18 NOTE — Patient Instructions (Addendum)
Please call the care guide team at [phone] if you need to cancel or reschedule your appointment.   If you are experiencing a Mental Health or Behavioral Health Crisis or need someone to talk to, please call the Suicide and Crisis Lifeline: 988 call the [place] National Suicide Prevention Lifeline: (207)061-7274 or TTY: (262) 520-1870 TTY 4077247600) to talk to a trained counselor call 1-800-273-TALK (toll free, 24 hour hotline) go to [facility] [address], [place] [phone])   Following is a copy of the CCM Program Consent:  CCM service includes personalized support from designated clinical staff supervised by the physician, including individualized plan of care and coordination with other care providers 24/7 contact phone numbers for assistance for urgent and routine care needs. Service will only be billed when office clinical staff spend 20 minutes or more in a month to coordinate care. Only one practitioner may furnish and bill the service in a calendar month. The patient may stop CCM services at amy time (effective at the end of the month) by phone call to the office staff. The patient will be responsible for cost sharing (co-pay) or up to 20% of the service fee (after annual deductible is met)  Following is a copy of your full provider care plan:   Goals Addressed             This Visit's Progress    CCM Expected Outcome:  Monitor, Self-Manage and Reduce Symptoms of:HLD       Current Barriers:  Care Coordination needs related to medication needs and ongoing support and education from the pharm D for management of medications in a patient with HLD Chronic Disease Management support and education needs related to effective management of HLD   Lab Results  Component Value Date   CHOL 55 (L) 08/04/2022   HDL 33 (L) 08/04/2022   LDLCALC 2 08/04/2022   TRIG 107 08/04/2022   CHOLHDL 1.7 08/04/2022    Planned  Interventions: Provider established cholesterol goals reviewed, Review of labs, education provided. The patient is compliant with the plan of care. The patient denies any new concerns with effective management of HLD ; Counseled on importance of regular laboratory monitoring as prescribed. The patient has labs on a regular basis. Labs are up to date; Provided HLD educational materials. Review of heart health and the importance of making sure he takes his medications and eating healthy. He also recently completed a home study sleep study. He has turned it back in and is waiting for results. He states he is sleeping at night but does not feel rested. Has fallen asleep during the day. Education and support given.; Reviewed role and benefits of statin for ASCVD risk reduction; Discussed strategies to manage statin-induced myalgias. Working with the pharm D on assistance with Repatha. Takes Rosuvastatin 40 mg QD, Rapatha 140 mg every 14 days, and Fish Oil 1000 mg. Is compliant with medications. Works with the pharm D on a regular basis.  ; Reviewed importance of limiting foods high in cholesterol. Education provided on a heart healthy diet. The patient only drinks coffee per his wife. Instructed on the need to drink water and stay hydrated for kidney health and overall health and well being.; Reviewed exercise goals and target of 150 minutes per week; Screening for signs and symptoms of depression related to chronic disease state;  Assessed social determinant of health barriers;  The patient states he is feeling more tired than usual. Discussed staying  hydrated, drinking more  water and trying some protein shakes, and eating more fruits and vegetables. Education and support given.   Symptom Management: Take medications as prescribed   Attend all scheduled provider appointments Call provider office for new concerns or questions  call the Suicide and Crisis Lifeline: 988 call the [place] National Suicide  Prevention Lifeline: 2095463242 or TTY: 979 435 0456 TTY (437) 719-1117) to talk to a trained counselor call 1-800-273-TALK (toll free, 24 hour hotline) if experiencing a Mental Health or Behavioral Health Crisis  - call for medicine refill 2 or 3 days before it runs out - take all medications exactly as prescribed - call doctor with any symptoms you believe are related to your medicine - call doctor when you experience any new symptoms - go to all doctor appointments as scheduled - adhere to prescribed diet: heart healthy  Follow Up Plan: Telephone follow up appointment with care management team member scheduled for: 09-29-2022 at 230 pm       CCM Expected Outcome:  Monitor, Self-Manage, and Reduce Symptoms of Hypertension       Current Barriers:  Chronic Disease Management support and education needs related to effective management of HTN  BP Readings from Last 3 Encounters:  08/04/22 132/82  04/05/22 130/78  12/01/21 136/80    Planned Interventions: Evaluation of current treatment plan related to hypertension self management and patient's adherence to plan as established by provider. Blood pressures have been up and down. May 31st and June 1st were very elevated. His wife was on speaker phone and gave several readings. See chart below. An inbasket message sent to the pcp and staff asking for a follow up appointment to see the pcp due to patient with elevated blood pressures.   08-11-2022 151/106   145/103   148/106   144/106   146/100   140/102  08-12-2022 151/101   124/84  08-13-2022 146/101   134/81  Provided education to patient re: stroke prevention, s/s of heart attack and stroke. Review of the patient being at increase risk of heart attack and stroke due to elevated blood pressures. Review of monitoring for chest pain, arm pain, changes in the way her feels, headaches and to seek emergent help for sx and sx of pending heart attack or stroke. Will provide information in my  chart for review and education. Reviewed prescribed diet heart healthy diet. Education provided on not eating foods high in potassium right now due to the patient having elevated potassium on blood draw this week. Review of limiting bananas and spinach and other foods rich in potassium. Potassium level at a 5.0 on repeat labs. The patient is mindful of foods with potassium Reviewed medications with patient and discussed importance of compliance. The patient is compliant with medications, works with pharm D. The patient was prescribed Valsartan 80 mg but has not received yet. Education on taking when he receives. It is coming by mail order. Discussed plans with patient for ongoing care management follow up and provided patient with direct contact information for care management team; Advised patient, providing education and rationale, to monitor blood pressure daily and record, calling PCP for findings outside established parameters;  Reviewed scheduled/upcoming provider appointments including: 11-24-2022 at 940 am. Has sent a message to the pcp and admin staff asking for help with getting a sooner appointment due to elevated blood pressures.  Advised patient to discuss changes in heart health, questions or concerns  with provider; Provided education on prescribed diet heart healthy diet. Education on staying hydrated and monitoring  foods high in potassium;  Discussed complications of poorly controlled blood pressure such as heart disease, stroke, circulatory complications, vision complications, kidney impairment, sexual dysfunction;  Screening for signs and symptoms of depression related to chronic disease state;  Assessed social determinant of health barriers;   Symptom Management: Take medications as prescribed   Attend all scheduled provider appointments Call provider office for new concerns or questions  call the Suicide and Crisis Lifeline: 988 call the [place] National Suicide Prevention Lifeline:  843 019 6028 or TTY: (401) 436-4902 TTY (959)641-5368) to talk to a trained counselor call 1-800-273-TALK (toll free, 24 hour hotline) if experiencing a Mental Health or Behavioral Health Crisis  check blood pressure weekly learn about high blood pressure call doctor for signs and symptoms of high blood pressure keep all doctor appointments take medications for blood pressure exactly as prescribed report new symptoms to your doctor  Follow Up Plan: Telephone follow up appointment with care management team member scheduled for: 09-29-2022 at 230 pm       CCM:  Maintain, Monitor and Self-Manage Symptoms of COPD       Current Barriers:  Chronic Disease Management support and education needs related to effective management of COPD  Planned Interventions: Provided patient with basic written and verbal COPD education on self care/management/and exacerbation prevention. The patient denies any issues with his COPD at this time. Is compliant with his plan of care. He does state that he does not have any energy and just completed a  sleep study test. Is waiting for results now.  Advised patient to track and manage COPD triggers. Knows triggers and factors that cause exacerbation. Denies any acute changes in his COPD. Review of protecting self from high pollen count and also weather changes that cause exacerbation. Provided written and verbal instructions on pursed lip breathing and utilized returned demonstration as teach back Provided instruction about proper use of medications used for management of COPD including inhalers. The patient is compliant with medications. Denies any new concerns with medications Advised patient to self assesses COPD action plan zone and make appointment with provider if in the yellow zone for 48 hours without improvement Advised patient to engage in light exercise as tolerated 3-5 days a week to aid in the the management of COPD Provided education about and advised patient  to utilize infection prevention strategies to reduce risk of respiratory infection. Education and support provided.  Discussed the importance of adequate rest and management of fatigue with COPD Screening for signs and symptoms of depression related to chronic disease state  Assessed social determinant of health barriers  Symptom Management: Take medications as prescribed   Attend all scheduled provider appointments Call provider office for new concerns or questions  call the Suicide and Crisis Lifeline: 988 call the [place] National Suicide Prevention Lifeline: 858-044-7065 or TTY: 872-741-9705 TTY 773-273-3874) to talk to a trained counselor call 1-800-273-TALK (toll free, 24 hour hotline) if experiencing a Mental Health or Behavioral Health Crisis  avoid second hand smoke eliminate smoking in my home identify and remove indoor air pollutants limit outdoor activity during cold weather listen for public air quality announcements every day do breathing exercises every day develop a rescue plan eliminate symptom triggers at home follow rescue plan if symptoms flare-up  Follow Up Plan: Telephone follow up appointment with care management team member scheduled for: 09-29-2022 at 230 pm          Patient verbalizes understanding of instructions and care plan provided today and agrees to view  in MyChart. Active MyChart status and patient understanding of how to access instructions and care plan via MyChart confirmed with patient.  Telephone follow up appointment with care management team member scheduled for: 09-29-2022 at 230 pm  Stroke Prevention Some medical conditions and lifestyle choices can lead to a higher risk for a stroke. You can help to prevent a stroke by eating healthy foods and exercising. It also helps to not smoke and to manage any health problems you may have. How can this condition affect me? A stroke is an emergency. It should be treated right away. A stroke can lead  to brain damage or threaten your life. There is a better chance of surviving and getting better after a stroke if you get medical help right away. What can increase my risk? The following medical conditions may increase your risk of a stroke: Diseases of the heart and blood vessels (cardiovascular disease). High blood pressure (hypertension). Diabetes. High cholesterol. Sickle cell disease. Problems with blood clotting. Being very overweight. Sleeping problems (obstructivesleep apnea). Other risk factors include: Being older than age 30. A history of blood clots, stroke, or mini-stroke (TIA). Race, ethnic background, or a family history of stroke. Smoking or using tobacco products. Taking birth control pills, especially if you smoke. Heavy alcohol and drug use. Not being active. What actions can I take to prevent this? Manage your health conditions High cholesterol. Eat a healthy diet. If this is not enough to manage your cholesterol, you may need to take medicines. Take medicines as told by your doctor. High blood pressure. Try to keep your blood pressure below 130/80. If your blood pressure cannot be managed through a healthy diet and regular exercise, you may need to take medicines. Take medicines as told by your doctor. Ask your doctor if you should check your blood pressure at home. Have your blood pressure checked every year. Diabetes. Eat a healthy diet and get regular exercise. If your blood sugar (glucose) cannot be managed through diet and exercise, you may need to take medicines. Take medicines as told by your doctor. Talk to your doctor about getting checked for sleeping problems. Signs of a problem can include: Snoring a lot. Feeling very tired. Make sure that you manage any other conditions you have. Nutrition  Follow instructions from your doctor about what to eat or drink. You may be told to: Eat and drink fewer calories each day. Limit how much salt (sodium)  you use to 1,500 milligrams (mg) each day. Use only healthy fats for cooking, such as olive oil, canola oil, and sunflower oil. Eat healthy foods. To do this: Choose foods that are high in fiber. These include whole grains, and fresh fruits and vegetables. Eat at least 5 servings of fruits and vegetables a day. Try to fill one-half of your plate with fruits and vegetables at each meal. Choose low-fat (lean) proteins. These include low-fat cuts of meat, chicken without skin, fish, tofu, beans, and nuts. Eat low-fat dairy products. Avoid foods that: Are high in salt. Have saturated fat. Have trans fat. Have cholesterol. Are processed or pre-made. Count how many carbohydrates you eat and drink each day. Lifestyle If you drink alcohol: Limit how much you have to: 0-1 drink a day for women who are not pregnant. 0-2 drinks a day for men. Know how much alcohol is in your drink. In the [place] one drink equals one 12 oz bottle of beer ( ), one 5 oz glass of wine ( ), or one 1 oz  glass of hard liquor (44mL). Do not smoke or use any products that have nicotine or tobacco. If you need help quitting, ask your doctor. Avoid secondhand smoke. Do not use drugs. Activity  Try to stay at a healthy weight. Get at least 30 minutes of exercise on most days, such as: Fast walking. Biking. Swimming. Medicines Take over-the-counter and prescription medicines only as told by your doctor. Avoid taking birth control pills. Talk to your doctor about the risks of taking birth control pills if: You are over 72 years old. You smoke. You get very bad headaches. You have had a blood clot. Where to find more information American Stroke Association: www.strokeassociation.org Get help right away if: You or a loved one has any signs of a stroke. "BE FAST" is an easy way to remember the warning signs: B - Balance. Dizziness, sudden trouble walking, or loss of balance. E - Eyes. Trouble seeing or a change  in how you see. F - Face. Sudden weakness or loss of feeling of the face. The face or eyelid may droop on one side. A - Arms. Weakness or loss of feeling in an arm. This happens all of a sudden and most often on one side of the body. S - Speech. Sudden trouble speaking, slurred speech, or trouble understanding what people say. T - Time. Time to call emergency services. Write down what time symptoms started. You or a loved one has other signs of a stroke, such as: A sudden, very bad headache with no known cause. Feeling like you may vomit (nausea). Vomiting. A seizure. These symptoms may be an emergency. Get help right away. Call your local emergency services (911 in the [place] Do not wait to see if the symptoms will go away. Do not drive yourself to the hospital. Summary You can help to prevent a stroke by eating healthy, exercising, and not smoking. It also helps to manage any health problems you have. Do not smoke or use any products that contain nicotine or tobacco. Get help right away if you or a loved one has any signs of a stroke. This information is not intended to replace advice given to you by your health care provider. Make sure you discuss any questions you have with your health care provider. Document Revised: 01/30/2022 Document Reviewed: 01/30/2022 Elsevier Patient Education  2024 [employer]. Heart Attack A heart attack occurs when blood and oxygen supply to the heart is cut off. A heart attack can cause damage to the heart that cannot be fixed. A heart attack is also called a myocardial infarction, or MI. If you think you are having a heart attack, do not wait to see if the symptoms will go away. Get medical help right away. What are the causes? This condition may be caused by: A fatty substance (plaque) in the blood vessels (arteries). This can block the flow of blood to the heart. A blood clot in the blood vessels that go to the heart. The blood clot blocks blood  flow. An abnormal heartbeat. Some diseases, such as problems in red blood cells (anemia)orproblems in breathing (respiratory failure). Tightening (spasm) of a blood vessel that cuts off blood to the heart. A tear in a blood vessel of the heart. Other causes may include: Using drugs such as cocaine or methamphetamine. Low blood pressure. What increases the risk? Aging. The risk gets higher as you get older. Having a personal or family history of chest pain, heart attack, stroke, or narrowing of the  arteries in the legs, arms, head, or stomach (peripheral vascular disease). Having taken chemotherapy or immune-suppressing medicines. Being male. Being overweight or obese. Having any of these conditions: High blood pressure. High cholesterol. Diabetes. Making lifestyle choices such as: Drinking too much alcohol. Not getting regular exercise. Smoking. What are the signs or symptoms? Chest pain. It may feel like: Crushing or squeezing. Tightness, pressure, fullness, or heaviness. Pain in the arm, neck, jaw, back, or upper body. Heartburn. Upset stomach (indigestion). Shortness of breath. Feeling like you may vomit (nauseous). Cold sweats. Sudden light-headedness, dizziness, or passing out. Feeling tired. How is this treated? A heart attack must be treated as soon as possible. Treatment may include: Medicines to: Break up or dissolve blood clots. Thin your blood and help prevent blood clots. Treat blood pressure. Improve blood flow to the heart. Reduce pain. Reduce cholesterol. Procedures to widen a blocked artery and keep it open. Open heart surgery. Making your heart strong again (cardiac rehabilitation) through exercise, education, and counseling. Follow these instructions at home: Medicines Take over-the-counter and prescription medicines only as told by your doctor. Do not take these medicines unless your doctor says it is okay: NSAIDs, such as ibuprofen, naproxen, or  celecoxib. Any vitamins or supplements. Hormone replacement therapy that has estrogen with or without progestin. If you are taking blood thinners: Talk with your doctor before taking any medicines that have aspirin or NSAIDs, such as ibuprofen. Take medicines exactly as told. Take them at the same time each day. Avoid doing things that could hurt or bruise you. Take action to prevent falls. Wear an alert bracelet or carry a card that shows you are taking blood thinners. Lifestyle  Do not smoke or use any products that contain nicotine or tobacco. If you need help quitting, ask your doctor. Avoid secondhand smoke. Exercise regularly. Ask your doctor about a cardiac rehab program. Eat heart-healthy foods. Your doctor will tell you what foods to eat. Stay at a healthy weight. Learn ways to lower your stress level. Do not use illegal drugs. Alcohol use Do not drink alcohol if: Your doctor tells you not to drink. You are pregnant, may be pregnant, or are planning to become pregnant. If you drink alcohol: Limit how much you have to: 0-1 drink a day for women. 0-2 drinks a day for men. Know how much alcohol is in your drink. In the [place] one drink equals one 12 oz bottle of beer (355 mL), one 5 oz glass of wine (148 mL), or one 1 oz glass of hard liquor (44 mL). General instructions Work with your doctor to treat other problems you may have, such as diabetes or high blood pressure. Get screened for depression. Get treatment if needed. Keep your vaccines up to date. Get the flu shot (influenza vaccine) every year. Keep all follow-up visits. Contact a doctor if: You feel very sad. You have trouble doing your daily activities. You get light-headed or dizzy. Get help right away if: You have sudden, unexplained discomfort in your chest, arms, back, neck, jaw, or upper body. You have shortness of breath. You have sudden sweating or clammy skin. You feel like you may vomit or you  vomit. You feel tired or weak. You feel your heart beating fast. You feel your heart skipping beats. You have blood pressure that is higher than 180/120. These symptoms may be an emergency. Get help right away. Call your local emergency services (911 in the [place] Do not wait to see if the symptoms  will go away. Do not drive yourself to the hospital. Summary A heart attack occurs when blood and oxygen supply to the heart is cut off. Do not take NSAIDs unless your doctor says it is okay. Do not smoke. Avoid secondhand smoke. Exercise regularly. Ask your doctor about a cardiac rehab program. This information is not intended to replace advice given to you by your health care provider. Make sure you discuss any questions you have with your health care provider. Document Revised: 08/19/2020 Document Reviewed: 08/19/2020 Elsevier Patient Education  2024 [employer]. Managing Your Hypertension Hypertension, also called high blood pressure, is when the force of the blood pressing against the walls of the arteries is too strong. Arteries are blood vessels that carry blood from your heart throughout your body. Hypertension forces the heart to work harder to pump blood and may cause the arteries to become narrow or stiff. Understanding blood pressure readings A blood pressure reading includes a higher number over a lower number: The first, or top, number is called the systolic pressure. It is a measure of the pressure in your arteries as your heart beats. The second, or bottom number, is called the diastolic pressure. It is a measure of the pressure in your arteries as the heart relaxes. For most people, a normal blood pressure is below 120/80. Your personal target blood pressure may vary depending on your medical conditions, your age, and other factors. Blood pressure is classified into four stages. Based on your blood pressure reading, your health care provider may use the following stages to  determine what type of treatment you need, if any. Systolic pressure and diastolic pressure are measured in a unit called millimeters of mercury (mmHg). Normal Systolic pressure: below 120. Diastolic pressure: below 80. Elevated Systolic pressure: 120-129. Diastolic pressure: below 80. Hypertension stage 1 Systolic pressure: 130-139. Diastolic pressure: 80-89. Hypertension stage 2 Systolic pressure: 140 or above. Diastolic pressure: 90 or above. How can this condition affect me? Managing your hypertension is very important. Over time, hypertension can damage the arteries and decrease blood flow to parts of the body, including the brain, heart, and kidneys. Having untreated or uncontrolled hypertension can lead to: A heart attack. A stroke. A weakened blood vessel (aneurysm). Heart failure. Kidney damage. Eye damage. Memory and concentration problems. Vascular dementia. What actions can I take to manage this condition? Hypertension can be managed by making lifestyle changes and possibly by taking medicines. Your health care provider will help you make a plan to bring your blood pressure within a normal range. You may be referred for counseling on a healthy diet and physical activity. Nutrition  Eat a diet that is high in fiber and potassium, and low in salt (sodium), added sugar, and fat. An example eating plan is called the DASH diet. DASH stands for Dietary Approaches to Stop Hypertension. To eat this way: Eat plenty of fresh fruits and vegetables. Try to fill one-half of your plate at each meal with fruits and vegetables. Eat whole grains, such as whole-wheat pasta, brown rice, or whole-grain bread. Fill about one-fourth of your plate with whole grains. Eat low-fat dairy products. Avoid fatty cuts of meat, processed or cured meats, and poultry with skin. Fill about one-fourth of your plate with lean proteins such as fish, chicken without skin, beans, eggs, and tofu. Avoid pre-made  and processed foods. These tend to be higher in sodium, added sugar, and fat. Reduce your daily sodium intake. Many people with hypertension should eat less than  1,500 mg of sodium a day. Lifestyle  Work with your health care provider to maintain a healthy body weight or to lose weight. Ask what an ideal weight is for you. Get at least 30 minutes of exercise that causes your heart to beat faster (aerobic exercise) most days of the week. Activities may include walking, swimming, or biking. Include exercise to strengthen your muscles (resistance exercise), such as weight lifting, as part of your weekly exercise routine. Try to do these types of exercises for 30 minutes at least 3 days a week. Do not use any products that contain nicotine or tobacco. These products include cigarettes, chewing tobacco, and vaping devices, such as e-cigarettes. If you need help quitting, ask your health care provider. Control any long-term (chronic) conditions you have, such as high cholesterol or diabetes. Identify your sources of stress and find ways to manage stress. This may include meditation, deep breathing, or making time for fun activities. Alcohol use Do not drink alcohol if: Your health care provider tells you not to drink. You are pregnant, may be pregnant, or are planning to become pregnant. If you drink alcohol: Limit how much you have to: 0-1 drink a day for women. 0-2 drinks a day for men. Know how much alcohol is in your drink. In the [place] one drink equals one 12 oz bottle of beer (355 mL), one 5 oz glass of wine (148 mL), or one 1 oz glass of hard liquor (44 mL). Medicines Your health care provider may prescribe medicine if lifestyle changes are not enough to get your blood pressure under control and if: Your systolic blood pressure is 130 or higher. Your diastolic blood pressure is 80 or higher. Take medicines only as told by your health care provider. Follow the directions carefully. Blood  pressure medicines must be taken as told by your health care provider. The medicine does not work as well when you skip doses. Skipping doses also puts you at risk for problems. Monitoring Before you monitor your blood pressure: Do not smoke, drink caffeinated beverages, or exercise within 30 minutes before taking a measurement. Use the bathroom and empty your bladder (urinate). Sit quietly for at least 5 minutes before taking measurements. Monitor your blood pressure at home as told by your health care provider. To do this: Sit with your back straight and supported. Place your feet flat on the floor. Do not cross your legs. Support your arm on a flat surface, such as a table. Make sure your upper arm is at heart level. Each time you measure, take two or three readings one minute apart and record the results. You may also need to have your blood pressure checked regularly by your health care provider. General information Talk with your health care provider about your diet, exercise habits, and other lifestyle factors that may be contributing to hypertension. Review all the medicines you take with your health care provider because there may be side effects or interactions. Keep all follow-up visits. Your health care provider can help you create and adjust your plan for managing your high blood pressure. Where to find more information National Heart, Lung, and Blood Institute: [employer].is American Heart Association: www.heart.org Contact a health care provider if: You think you are having a reaction to medicines you have taken. You have repeated (recurrent) headaches. You feel dizzy. You have swelling in your ankles. You have trouble with your vision. Get help right away if: You develop a severe headache or confusion. You have unusual  weakness or numbness, or you feel faint. You have severe pain in your chest or abdomen. You vomit repeatedly. You have trouble breathing. These  symptoms may be an emergency. Get help right away. Call 911. Do not wait to see if the symptoms will go away. Do not drive yourself to the hospital. Summary Hypertension is when the force of blood pumping through your arteries is too strong. If this condition is not controlled, it may put you at risk for serious complications. Your personal target blood pressure may vary depending on your medical conditions, your age, and other factors. For most people, a normal blood pressure is less than 120/80. Hypertension is managed by lifestyle changes, medicines, or both. Lifestyle changes to help manage hypertension include losing weight, eating a healthy, low-sodium diet, exercising more, stopping smoking, and limiting alcohol. This information is not intended to replace advice given to you by your health care provider. Make sure you discuss any questions you have with your health care provider. Document Revised: 11/11/2020 Document Reviewed: 11/11/2020 Elsevier Patient Education  2024 [employer]. Hypertension, Adult Hypertension is another name for high blood pressure. High blood pressure forces your heart to work harder to pump blood. This can cause problems over time. There are two numbers in a blood pressure reading. There is a top number (systolic) over a bottom number (diastolic). It is best to have a blood pressure that is below 120/80. What are the causes? The cause of this condition is not known. Some other conditions can lead to high blood pressure. What increases the risk? Some lifestyle factors can make you more likely to develop high blood pressure: Smoking. Not getting enough exercise or physical activity. Being overweight. Having too much fat, sugar, calories, or salt (sodium) in your diet. Drinking too much alcohol. Other risk factors include: Having any of these conditions: Heart disease. Diabetes. High cholesterol. Kidney disease. Obstructive sleep apnea. Having a family  history of high blood pressure and high cholesterol. Age. The risk increases with age. Stress. What are the signs or symptoms? High blood pressure may not cause symptoms. Very high blood pressure (hypertensive crisis) may cause: Headache. Fast or uneven heartbeats (palpitations). Shortness of breath. Nosebleed. Vomiting or feeling like you may vomit (nauseous). Changes in how you see. Very bad chest pain. Feeling dizzy. Seizures. How is this treated? This condition is treated by making healthy lifestyle changes, such as: Eating healthy foods. Exercising more. Drinking less alcohol. Your doctor may prescribe medicine if lifestyle changes do not help enough and if: Your top number is above 130. Your bottom number is above 80. Your personal target blood pressure may vary. Follow these instructions at home: Eating and drinking  If told, follow the DASH eating plan. To follow this plan: Fill one half of your plate at each meal with fruits and vegetables. Fill one fourth of your plate at each meal with whole grains. Whole grains include whole-wheat pasta, brown rice, and whole-grain bread. Eat or drink low-fat dairy products, such as skim milk or low-fat yogurt. Fill one fourth of your plate at each meal with low-fat (lean) proteins. Low-fat proteins include fish, chicken without skin, eggs, beans, and tofu. Avoid fatty meat, cured and processed meat, or chicken with skin. Avoid pre-made or processed food. Limit the amount of salt in your diet to less than 1,500 mg each day. Do not drink alcohol if: Your doctor tells you not to drink. You are pregnant, may be pregnant, or are planning to become pregnant.  If you drink alcohol: Limit how much you have to: 0-1 drink a day for women. 0-2 drinks a day for men. Know how much alcohol is in your drink. In the [place] one drink equals one 12 oz bottle of beer (355 mL), one 5 oz glass of wine (148 mL), or one 1 oz glass of hard liquor (44  mL). Lifestyle  Work with your doctor to stay at a healthy weight or to lose weight. Ask your doctor what the best weight is for you. Get at least 30 minutes of exercise that causes your heart to beat faster (aerobic exercise) most days of the week. This may include walking, swimming, or biking. Get at least 30 minutes of exercise that strengthens your muscles (resistance exercise) at least 3 days a week. This may include lifting weights or doing Pilates. Do not smoke or use any products that contain nicotine or tobacco. If you need help quitting, ask your doctor. Check your blood pressure at home as told by your doctor. Keep all follow-up visits. Medicines Take over-the-counter and prescription medicines only as told by your doctor. Follow directions carefully. Do not skip doses of blood pressure medicine. The medicine does not work as well if you skip doses. Skipping doses also puts you at risk for problems. Ask your doctor about side effects or reactions to medicines that you should watch for. Contact a doctor if: You think you are having a reaction to the medicine you are taking. You have headaches that keep coming back. You feel dizzy. You have swelling in your ankles. You have trouble with your vision. Get help right away if: You get a very bad headache. You start to feel mixed up (confused). You feel weak or numb. You feel faint. You have very bad pain in your: Chest. Belly (abdomen). You vomit more than once. You have trouble breathing. These symptoms may be an emergency. Get help right away. Call 911. Do not wait to see if the symptoms will go away. Do not drive yourself to the hospital. Summary Hypertension is another name for high blood pressure. High blood pressure forces your heart to work harder to pump blood. For most people, a normal blood pressure is less than 120/80. Making healthy choices can help lower blood pressure. If your blood pressure does not get lower  with healthy choices, you may need to take medicine. This information is not intended to replace advice given to you by your health care provider. Make sure you discuss any questions you have with your health care provider. Document Revised: 12/16/2020 Document Reviewed: 12/16/2020 Elsevier Patient Education  2024 [employer].

## 2022-08-18 NOTE — Chronic Care Management (AMB) (Signed)
Chronic Care Management   CCM RN Visit Note  08/18/2022 Name: Scott Foley MRN: [id number] DOB: 30-Sep-1965  Subjective: Scott Foley is a 56 y.o. year old male who is a primary care patient of [name], [name], MD. The patient was referred to the Chronic Care Management team for assistance with care management needs subsequent to provider initiation of CCM services and plan of care.    Today's Visit:  Engaged with patient by telephone for follow up visit.        Goals Addressed             This Visit's Progress    CCM Expected Outcome:  Monitor, Self-Manage and Reduce Symptoms of:HLD       Current Barriers:  Care Coordination needs related to medication needs and ongoing support and education from the pharm D for management of medications in a patient with HLD Chronic Disease Management support and education needs related to effective management of HLD   Lab Results  Component Value Date   CHOL 55 (L) 08/04/2022   HDL 33 (L) 08/04/2022   LDLCALC 2 08/04/2022   TRIG 107 08/04/2022   CHOLHDL 1.7 08/04/2022    Planned Interventions: Provider established cholesterol goals reviewed, Review of labs, education provided. The patient is compliant with the plan of care. The patient denies any new concerns with effective management of HLD ; Counseled on importance of regular laboratory monitoring as prescribed. The patient has labs on a regular basis. Labs are up to date; Provided HLD educational materials. Review of heart health and the importance of making sure he takes his medications and eating healthy. He also recently completed a home study sleep study. He has turned it back in and is waiting for results. He states he is sleeping at night but does not feel rested. Has fallen asleep during the day. Education and support given.; Reviewed role and benefits of statin for ASCVD risk reduction; Discussed strategies to manage statin-induced myalgias. Working with the pharm D  on assistance with Repatha. Takes Rosuvastatin 40 mg QD, Rapatha 140 mg every 14 days, and Fish Oil 1000 mg. Is compliant with medications. Works with the pharm D on a regular basis.  ; Reviewed importance of limiting foods high in cholesterol. Education provided on a heart healthy diet. The patient only drinks coffee per his wife. Instructed on the need to drink water and stay hydrated for kidney health and overall health and well being.; Reviewed exercise goals and target of 150 minutes per week; Screening for signs and symptoms of depression related to chronic disease state;  Assessed social determinant of health barriers;  The patient states he is feeling more tired than usual. Discussed staying  hydrated, drinking more water and trying some protein shakes, and eating more fruits and vegetables. Education and support given.   Symptom Management: Take medications as prescribed   Attend all scheduled provider appointments Call provider office for new concerns or questions  call the Suicide and Crisis Lifeline: 988 call the [place] National Suicide Prevention Lifeline: 506-461-5426 or TTY: 251 560 5322 TTY 518 872 3636) to talk to a trained counselor call 1-800-273-TALK (toll free, 24 hour hotline) if experiencing a Mental Health or Behavioral Health Crisis  - call for medicine refill 2 or 3 days before it runs out - take all medications exactly as prescribed - call doctor with any symptoms you believe are related to your medicine - call doctor when you experience any new symptoms - go to all doctor appointments as  scheduled - adhere to prescribed diet: heart healthy  Follow Up Plan: Telephone follow up appointment with care management team member scheduled for: 09-29-2022 at 230 pm       CCM Expected Outcome:  Monitor, Self-Manage, and Reduce Symptoms of Hypertension       Current Barriers:  Chronic Disease Management support and education needs related to effective management of HTN  BP  Readings from Last 3 Encounters:  08/04/22 132/82  04/05/22 130/78  12/01/21 136/80    Planned Interventions: Evaluation of current treatment plan related to hypertension self management and patient's adherence to plan as established by provider. Blood pressures have been up and down. May 31st and June 1st were very elevated. His wife was on speaker phone and gave several readings. See chart below. An inbasket message sent to the pcp and staff asking for a follow up appointment to see the pcp due to patient with elevated blood pressures.   08-11-2022 151/106   145/103   148/106   144/106   146/100   140/102  08-12-2022 151/101   124/84  08-13-2022 146/101   134/81  Provided education to patient re: stroke prevention, s/s of heart attack and stroke. Review of the patient being at increase risk of heart attack and stroke due to elevated blood pressures. Review of monitoring for chest pain, arm pain, changes in the way her feels, headaches and to seek emergent help for sx and sx of pending heart attack or stroke. Will provide information in my chart for review and education. Reviewed prescribed diet heart healthy diet. Education provided on not eating foods high in potassium right now due to the patient having elevated potassium on blood draw this week. Review of limiting bananas and spinach and other foods rich in potassium. Potassium level at a 5.0 on repeat labs. The patient is mindful of foods with potassium Reviewed medications with patient and discussed importance of compliance. The patient is compliant with medications, works with pharm D. The patient was prescribed Valsartan 80 mg but has not received yet. Education on taking when he receives. It is coming by mail order. Discussed plans with patient for ongoing care management follow up and provided patient with direct contact information for care management team; Advised patient, providing education and rationale, to monitor blood pressure  daily and record, calling PCP for findings outside established parameters;  Reviewed scheduled/upcoming provider appointments including: 11-24-2022 at 940 am. Has sent a message to the pcp and admin staff asking for help with getting a sooner appointment due to elevated blood pressures.  Advised patient to discuss changes in heart health, questions or concerns  with provider; Provided education on prescribed diet heart healthy diet. Education on staying hydrated and monitoring foods high in potassium;  Discussed complications of poorly controlled blood pressure such as heart disease, stroke, circulatory complications, vision complications, kidney impairment, sexual dysfunction;  Screening for signs and symptoms of depression related to chronic disease state;  Assessed social determinant of health barriers;   Symptom Management: Take medications as prescribed   Attend all scheduled provider appointments Call provider office for new concerns or questions  call the Suicide and Crisis Lifeline: 988 call the [place] National Suicide Prevention Lifeline: 318 599 6860 or TTY: 702-856-5484 TTY 631-100-2509) to talk to a trained counselor call 1-800-273-TALK (toll free, 24 hour hotline) if experiencing a Mental Health or Behavioral Health Crisis  check blood pressure weekly learn about high blood pressure call doctor for signs and symptoms of high blood pressure keep  all doctor appointments take medications for blood pressure exactly as prescribed report new symptoms to your doctor  Follow Up Plan: Telephone follow up appointment with care management team member scheduled for: 09-29-2022 at 230 pm       CCM:  Maintain, Monitor and Self-Manage Symptoms of COPD       Current Barriers:  Chronic Disease Management support and education needs related to effective management of COPD  Planned Interventions: Provided patient with basic written and verbal COPD education on self care/management/and  exacerbation prevention. The patient denies any issues with his COPD at this time. Is compliant with his plan of care. He does state that he does not have any energy and just completed a  sleep study test. Is waiting for results now.  Advised patient to track and manage COPD triggers. Knows triggers and factors that cause exacerbation. Denies any acute changes in his COPD. Review of protecting self from high pollen count and also weather changes that cause exacerbation. Provided written and verbal instructions on pursed lip breathing and utilized returned demonstration as teach back Provided instruction about proper use of medications used for management of COPD including inhalers. The patient is compliant with medications. Denies any new concerns with medications Advised patient to self assesses COPD action plan zone and make appointment with provider if in the yellow zone for 48 hours without improvement Advised patient to engage in light exercise as tolerated 3-5 days a week to aid in the the management of COPD Provided education about and advised patient to utilize infection prevention strategies to reduce risk of respiratory infection. Education and support provided.  Discussed the importance of adequate rest and management of fatigue with COPD Screening for signs and symptoms of depression related to chronic disease state  Assessed social determinant of health barriers  Symptom Management: Take medications as prescribed   Attend all scheduled provider appointments Call provider office for new concerns or questions  call the Suicide and Crisis Lifeline: 988 call the [place] National Suicide Prevention Lifeline: (765)173-9115 or TTY: (947)377-2901 TTY (431)255-7674) to talk to a trained counselor call 1-800-273-TALK (toll free, 24 hour hotline) if experiencing a Mental Health or Behavioral Health Crisis  avoid second hand smoke eliminate smoking in my home identify and remove indoor air  pollutants limit outdoor activity during cold weather listen for public air quality announcements every day do breathing exercises every day develop a rescue plan eliminate symptom triggers at home follow rescue plan if symptoms flare-up  Follow Up Plan: Telephone follow up appointment with care management team member scheduled for: 09-29-2022 at 230 pm          Plan:Telephone follow up appointment with care management team member scheduled for:  09-29-2022 at 230 pm  Alto Denver RN, MSN, CCM RN Care Manager  Chronic Care Management Direct Number: [phone]

## 2022-08-22 ENCOUNTER — Telehealth: Payer: Self-pay

## 2022-08-22 ENCOUNTER — Other Ambulatory Visit: Payer: Self-pay

## 2022-08-22 DIAGNOSIS — R400 Somnolence: Secondary | ICD-10-CM

## 2022-08-22 NOTE — Telephone Encounter (Signed)
Patient was informed about the results of Home Sleep study. He is positive for sleep apnea. Order was sent to [facility] for CPAP.

## 2022-08-24 ENCOUNTER — Telehealth: Payer: Self-pay

## 2022-08-24 NOTE — Telephone Encounter (Signed)
Scott Foley,   [name] called this afternoon stating that [facility] is needing a copy of the sleep study and office note. Unsure what you have sent already can you please send the information that he is requesting. I was unable to find the sleep study in the chart.  Thank you

## 2022-08-24 NOTE — Telephone Encounter (Addendum)
Called patient he stated blood pressure are doing better and his Valsartan should be delivered today, has an appointment coming up with Dr. Sedalia Muta 09/27/22, and would call if he stated having any problems before than.   ----- Message from Marlowe Sax, RN sent at 08/18/2022  1:51 PM EDT ----- Regarding: patient needs an appointment for follow up- high blood pressures Hello, I know that Dr. Sedalia Muta is on vacation but when she gets back can you schedule and appointment for follow up. He has been up and down on his blood pressures with several readings >140/100 with highest 148/106.  He has not received his Valsartan yet and should get it on 08-22-2022.  He is receptive to coming in to see the pcp. Thanks,  Pam

## 2022-09-04 ENCOUNTER — Ambulatory Visit: Payer: Medicare HMO | Admitting: Cardiology

## 2022-09-05 ENCOUNTER — Other Ambulatory Visit: Payer: Self-pay | Admitting: Family Medicine

## 2022-09-05 DIAGNOSIS — I119 Hypertensive heart disease without heart failure: Secondary | ICD-10-CM

## 2022-09-05 DIAGNOSIS — E782 Mixed hyperlipidemia: Secondary | ICD-10-CM

## 2022-09-07 NOTE — Progress Notes (Signed)
Cardiology Office Note:  .   Date:  09/08/2022  ID:  Scott Foley, DOB [birth year], MRN [id number] PCP: Blane Ohara, MD  [facility] Providers Cardiologist:  Thurmon Fair, MD    History of Present Illness: .   Scott Foley is a 57 y.o. male with a history of CAD noted on CT imaging possible MI in [place] in 2012, COPD, hypertension, acquired thrombophilia, hyperlipidemia, history of CVA 2014, history of pulmonary embolism anticoagulated with Eliquis, pulmonary nodules, CKD stage IIIb, necrotizing pancreatitis, OSA.  02/09/2021 MPI low risk study, no evidence of ischemia 05/01/2021 cardiac catheterization for pericardiocentesis 11/29/2021 echocardiogram EF 50 to 55%, grade 2 DD, mild MR  He establish care with Dr. Tomie China on 02/15/2021 at the behest of his PCP for management of his CAD.  He presents today accompanied by his wife for follow-up of his coronary artery disease.  He offers no complaints, outside of being fatigued.  He has been recently diagnosed with sleep apnea and is in the process of getting his CPAP machine, which he is hopeful will help with his fatigue.  They do check his blood pressure at home sporadically, he had had recent elevated blood pressure readings but more recently has been well-controlled.  He continues to exercise 3 times a week by walking for approximately 25 minutes.  He does not use any tobacco products, does not participate in drinking. He denies chest pain, palpitations, dyspnea, pnd, orthopnea, n, v, dizziness, syncope, edema, weight gain, or early satiety.   ROS: Review of Systems  Constitutional:  Positive for malaise/fatigue.  HENT: Negative.    Eyes: Negative.   Respiratory: Negative.    Cardiovascular: Negative.   Gastrointestinal: Negative.   Genitourinary: Negative.   Musculoskeletal: Negative.   Skin: Negative.   Neurological: Negative.   Endo/Heme/Allergies: Negative.   Psychiatric/Behavioral: Negative.       Studies  Reviewed: .        Cardiac Studies & Procedures   CARDIAC CATHETERIZATION  CARDIAC CATHETERIZATION 05/01/2021  Narrative Successful pericardiocentesis from an intercostal approach with removal of 600 cc of hemorrhagic fluid.   Plan: will leave drain in place for now. Check portable CXR. Fluid sent for analysis. Will hold IV heparin.   STRESS TESTS  NM MYOCAR MULTI W/SPECT W 02/09/2021  Narrative   The study is low risk.   No ST deviation was noted.   Left ventricular function is normal. LVEF hyperdynamic (>75)   There is no evidence for ischemia   There is no significant coronary artery calcification   ECHOCARDIOGRAM  ECHOCARDIOGRAM COMPLETE 11/29/2021  Narrative ECHOCARDIOGRAM REPORT    Patient Name:   Scott Foley Date of Exam: 11/29/2021 Medical Rec #:  [id number]             Height:       64.0 in Accession #:    [id number]            Weight:       194.2 lb Date of Birth:  [birth year]             BSA:          1.932 m Patient Age:    56 years              BP:           131/80 mmHg Patient Gender: M                     HR:  51 bpm. Exam Location:  [facility]  Procedure: 2D Echo, Cardiac Doppler, Color Doppler and Strain Analysis  Indications:    Coronary artery disease of native artery of native heart with stable angina pectoris (HCC) [I25.118 (ICD-10-CM)]; History of CVA (cerebrovascular accident) Ill[place]ipi.Nielsen (ICD-10-CM)]; Pericardial effusion [I31.39 (ICD-10-CM)]  History:        Patient has prior history of Echocardiogram examinations. Angina.  Sonographer:    Louie Boston RDCS Referring Phys: Rito Ehrlich [facility]  IMPRESSIONS   1. Left ventricular ejection fraction, by estimation, is 50 to 55%. The left ventricle has low normal function. The left ventricle has no regional wall motion abnormalities. Left ventricular diastolic parameters are consistent with Grade II diastolic dysfunction (pseudonormalization). 2. Right ventricular systolic  function is normal. The right ventricular size is normal. There is normal pulmonary artery systolic pressure. 3. The mitral valve is normal in structure. Mild mitral valve regurgitation. No evidence of mitral stenosis. 4. The aortic valve is normal in structure. Aortic valve regurgitation is not visualized. No aortic stenosis is present. 5. The inferior vena cava is normal in size with greater than 50% respiratory variability, suggesting right atrial pressure of 3 mmHg.  FINDINGS Left Ventricle: Left ventricular ejection fraction, by estimation, is 50 to 55%. The left ventricle has low normal function. The left ventricle has no regional wall motion abnormalities. The left ventricular internal cavity size was normal in size. There is no left ventricular hypertrophy. Left ventricular diastolic parameters are consistent with Grade II diastolic dysfunction (pseudonormalization).  Right Ventricle: The right ventricular size is normal. No increase in right ventricular wall thickness. Right ventricular systolic function is normal. There is normal pulmonary artery systolic pressure. The tricuspid regurgitant velocity is 2.28 m/s, and with an assumed right atrial pressure of 3 mmHg, the estimated right ventricular systolic pressure is 23.8 mmHg.  Left Atrium: Left atrial size was normal in size.  Right Atrium: Right atrial size was normal in size.  Pericardium: There is no evidence of pericardial effusion.  Mitral Valve: The mitral valve is normal in structure. Mild mitral valve regurgitation. No evidence of mitral valve stenosis.  Tricuspid Valve: The tricuspid valve is normal in structure. Tricuspid valve regurgitation is mild . No evidence of tricuspid stenosis.  Aortic Valve: The aortic valve is normal in structure. Aortic valve regurgitation is not visualized. No aortic stenosis is present.  Pulmonic Valve: The pulmonic valve was normal in structure. Pulmonic valve regurgitation is not  visualized. No evidence of pulmonic stenosis.  Aorta: The aortic root is normal in size and structure.  Venous: The inferior vena cava is normal in size with greater than 50% respiratory variability, suggesting right atrial pressure of 3 mmHg.  IAS/Shunts: No atrial level shunt detected by color flow Doppler.   LEFT VENTRICLE PLAX 2D LVIDd:         4.50 cm     Diastology LVIDs:         3.40 cm     LV e' medial:    5.00 cm/s LV PW:         0.80 cm     LV E/e' medial:  15.9 LV IVS:        0.80 cm     LV e' lateral:   6.64 cm/s LVOT diam:     2.00 cm     LV E/e' lateral: 11.9 LV SV:         58 LV SV Index:   30 LVOT Area:  3.14 cm  LV Volumes (MOD) LV vol d, MOD A2C: 95.0 ml LV vol d, MOD A4C: 69.1 ml LV vol s, MOD A2C: 45.2 ml LV vol s, MOD A4C: 32.8 ml LV SV MOD A2C:     49.8 ml LV SV MOD A4C:     69.1 ml LV SV MOD BP:      44.1 ml  RIGHT VENTRICLE            IVC RV S prime:     7.62 cm/s  IVC diam: 1.40 cm TAPSE (M-mode): 2.0 cm  LEFT ATRIUM             Index        RIGHT ATRIUM           Index LA diam:        3.00 cm 1.55 cm/m   RA Area:     13.20 cm LA Vol (A2C):   64.3 ml 33.28 ml/m  RA Volume:   31.80 ml  16.46 ml/m LA Vol (A4C):   42.6 ml 22.05 ml/m LA Biplane Vol: 55.8 ml 28.88 ml/m AORTIC VALVE LVOT Vmax:   75.70 cm/s LVOT Vmean:  55.300 cm/s LVOT VTI:    0.186 m  AORTA Ao Root diam: 3.20 cm Ao Asc diam:  3.00 cm Ao Desc diam: 2.00 cm  MITRAL VALVE               TRICUSPID VALVE MV Area (PHT): 3.16 cm    TR Peak grad:   20.8 mmHg MV Decel Time: 240 msec    TR Vmax:        228.00 cm/s MV E velocity: 79.30 cm/s MV A velocity: 68.10 cm/s  SHUNTS MV E/A ratio:  1.16        Systemic VTI:  0.19 m Systemic Diam: 2.00 cm  Gypsy Balsam MD Electronically signed by Gypsy Balsam MD Signature Date/Time: 11/29/2021/4:59:40 PM    Final             Risk Assessment/Calculations:             Physical Exam:   VS:  BP 130/80 (BP Location: Left  Arm, Patient Position: Sitting, Cuff Size: Normal)   Pulse 64   Ht 5\' 4"  (1.626 m)   Wt 221 lb (100.2 kg)   SpO2 97%   BMI 37.93 kg/m    Wt Readings from Last 3 Encounters:  09/08/22 221 lb (100.2 kg)  08/04/22 221 lb (100.2 kg)  04/05/22 212 lb (96.2 kg)    GEN: Well nourished, well developed in no acute distress NECK: No JVD; No carotid bruits CARDIAC: RRR, no murmurs, rubs, gallops RESPIRATORY:  Clear to auscultation without rales, wheezing or rhonchi  ABDOMEN: Soft, non-tender, non-distended EXTREMITIES:  No edema; No deformity   ASSESSMENT AND PLAN: .   CAD - s/p MI in [place] ~ 2012--states he was just advised he had one and did not have a LHC. Stable with no anginal symptoms. No indication for ischemic evaluation.  He is on Plavix it appears for stroke prevention, he is on Eliquis for PEs--both managed by his PCP.  Continue metoprolol 50 mg once daily, continue Repatha, continue Crestor 40 mg daily.  Hypertension - blood pressure is controlled at 130/80, continue valsartan 80 mg daily, continue metoprolol 50 mg daily.  History of PE -anticoagulated with Eliquis, managed by PCP.  Hyperlipidemia - most recent LDL very well-controlled at  2, continue Repatha, continue Crestor 40 mg daily, this appears  to be managed by his PCP.       Dispo: Return in 1 year, sooner if needed.  Signed, Flossie Dibble, NP

## 2022-09-08 ENCOUNTER — Encounter: Payer: Self-pay | Admitting: Cardiology

## 2022-09-08 ENCOUNTER — Ambulatory Visit: Payer: Medicare HMO | Attending: Cardiology | Admitting: Cardiology

## 2022-09-08 ENCOUNTER — Other Ambulatory Visit: Payer: Medicare HMO

## 2022-09-08 VITALS — BP 130/80 | HR 64 | Ht 64.0 in | Wt 221.0 lb

## 2022-09-08 DIAGNOSIS — I10 Essential (primary) hypertension: Secondary | ICD-10-CM | POA: Diagnosis not present

## 2022-09-08 DIAGNOSIS — I25118 Atherosclerotic heart disease of native coronary artery with other forms of angina pectoris: Secondary | ICD-10-CM | POA: Diagnosis not present

## 2022-09-08 DIAGNOSIS — D729 Disorder of white blood cells, unspecified: Secondary | ICD-10-CM

## 2022-09-08 DIAGNOSIS — Z86711 Personal history of pulmonary embolism: Secondary | ICD-10-CM | POA: Diagnosis not present

## 2022-09-08 DIAGNOSIS — Z8673 Personal history of transient ischemic attack (TIA), and cerebral infarction without residual deficits: Secondary | ICD-10-CM

## 2022-09-08 DIAGNOSIS — E782 Mixed hyperlipidemia: Secondary | ICD-10-CM

## 2022-09-08 LAB — CBC WITH DIFFERENTIAL/PLATELET
Basophils Absolute: 0.1 10*3/uL (ref 0.0–0.2)
Basos: 1 %
EOS (ABSOLUTE): 0.2 10*3/uL (ref 0.0–0.4)
Eos: 1 %
Hematocrit: 46.9 % (ref 37.5–51.0)
Hemoglobin: 15.2 g/dL (ref 13.0–17.7)
Immature Grans (Abs): 0 10*3/uL (ref 0.0–0.1)
Immature Granulocytes: 0 %
Lymphocytes Absolute: 3.6 10*3/uL — ABNORMAL HIGH (ref 0.7–3.1)
Lymphs: 32 %
MCH: 29.3 pg (ref 26.6–33.0)
MCHC: 32.4 g/dL (ref 31.5–35.7)
MCV: 90 fL (ref 79–97)
Monocytes Absolute: 0.8 10*3/uL (ref 0.1–0.9)
Monocytes: 7 %
Neutrophils Absolute: 6.7 10*3/uL (ref 1.4–7.0)
Neutrophils: 59 %
Platelets: 279 10*3/uL (ref 150–450)
RBC: 5.19 x10E6/uL (ref 4.14–5.80)
RDW: 13.7 % (ref 11.6–15.4)
WBC: 11.3 10*3/uL — ABNORMAL HIGH (ref 3.4–10.8)

## 2022-09-08 NOTE — Patient Instructions (Signed)
Medication Instructions:  Your physician recommends that you continue on your current medications as directed. Please refer to the Current Medication list given to you today.  *If you need a refill on your cardiac medications before your next appointment, please call your pharmacy*   Lab Work: NONE If you have labs (blood work) drawn today and your tests are completely normal, you will receive your results only by: MyChart Message (if you have MyChart) OR A paper copy in the mail If you have any lab test that is abnormal or we need to change your treatment, we will call you to review the results.   Testing/Procedures: NONE   Follow-Up: At [facility], you and your health needs are our priority.  As part of our continuing mission to provide you with exceptional heart care, we have created designated Provider Care Teams.  These Care Teams include your primary Cardiologist (physician) and Advanced Practice Providers (APPs -  Physician Assistants and Nurse Practitioners) who all work together to provide you with the care you need, when you need it.  We recommend signing up for the patient portal called "MyChart".  Sign up information is provided on this After Visit Summary.  MyChart is used to connect with patients for Virtual Visits (Telemedicine).  Patients are able to view lab/test results, encounter notes, upcoming appointments, etc.  Non-urgent messages can be sent to your provider as well.   To learn more about what you can do with MyChart, go to [email or web address].    Your next appointment:   1 year(s)  Provider:   Rajan Revankar, MD    Other Instructions   

## 2022-09-10 DIAGNOSIS — I10 Essential (primary) hypertension: Secondary | ICD-10-CM

## 2022-09-10 DIAGNOSIS — J449 Chronic obstructive pulmonary disease, unspecified: Secondary | ICD-10-CM | POA: Diagnosis not present

## 2022-09-10 DIAGNOSIS — E785 Hyperlipidemia, unspecified: Secondary | ICD-10-CM

## 2022-09-13 DIAGNOSIS — N1831 Chronic kidney disease, stage 3a: Secondary | ICD-10-CM | POA: Diagnosis not present

## 2022-09-13 DIAGNOSIS — D631 Anemia in chronic kidney disease: Secondary | ICD-10-CM | POA: Diagnosis not present

## 2022-09-13 DIAGNOSIS — N2581 Secondary hyperparathyroidism of renal origin: Secondary | ICD-10-CM | POA: Diagnosis not present

## 2022-09-13 DIAGNOSIS — I129 Hypertensive chronic kidney disease with stage 1 through stage 4 chronic kidney disease, or unspecified chronic kidney disease: Secondary | ICD-10-CM | POA: Diagnosis not present

## 2022-09-13 DIAGNOSIS — N182 Chronic kidney disease, stage 2 (mild): Secondary | ICD-10-CM | POA: Diagnosis not present

## 2022-09-13 DIAGNOSIS — N189 Chronic kidney disease, unspecified: Secondary | ICD-10-CM | POA: Diagnosis not present

## 2022-09-27 ENCOUNTER — Ambulatory Visit: Payer: Medicare HMO

## 2022-09-27 VITALS — BP 122/80 | HR 76 | Resp 16 | Ht 64.0 in | Wt 221.0 lb

## 2022-09-27 DIAGNOSIS — Z0000 Encounter for general adult medical examination without abnormal findings: Secondary | ICD-10-CM

## 2022-09-27 NOTE — Patient Instructions (Addendum)
Mr. Scott Foley , Thank you for taking time to come for your Medicare Wellness Visit. I appreciate your ongoing commitment to your health goals. Please review the following plan we discussed and let me know if I can assist you in the future.    This is a list of the screening recommended for you and due dates:  Health Maintenance  Topic Date Due   DTaP/Tdap/Td vaccine (1 - Tdap) Never done   COVID-19 Vaccine (1) 09/27/2023*   Zoster (Shingles) Vaccine (1 of 2) 09/27/2023*   Flu Shot  10/12/2022   Screening for Lung Cancer  12/21/2022   Medicare Annual Wellness Visit  09/27/2023   Colon Cancer Screening  10/26/2030   Hepatitis C Screening  Completed   HIV Screening  Completed   HPV Vaccine  Aged Out  *Topic was postponed. The date shown is not the original due date.   If you decide to get your Tetanus or Shingrix vaccines you can get them from the pharmacy.  Preventive Care 40-64 Years, Male Preventive care refers to lifestyle choices and visits with your health care provider that can promote health and wellness. What does preventive care include? A yearly physical exam. This is also called an annual well check. Dental exams once or twice a year. Routine eye exams. Ask your health care provider how often you should have your eyes checked. Personal lifestyle choices, including: Daily care of your teeth and gums. Regular physical activity. Eating a healthy diet. Avoiding tobacco and drug use. Limiting alcohol use. Practicing safe sex. Taking low-dose aspirin every day starting at age 58. What happens during an annual well check? The services and screenings done by your health care provider during your annual well check will depend on your age, overall health, lifestyle risk factors, and family history of disease. Counseling  Your health care provider may ask you questions about your: Alcohol use. Tobacco use. Drug use. Emotional well-being. Home and relationship well-being. Sexual  activity. Eating habits. Work and work Astronomer. Screening  You may have the following tests or measurements: Height, weight, and BMI. Blood pressure. Lipid and cholesterol levels. These may be checked every 5 years, or more frequently if you are over 16 years old. Skin check. Lung cancer screening. You may have this screening every year starting at age 1 if you have a 30-pack-year history of smoking and currently smoke or have quit within the past 15 years. Fecal occult blood test (FOBT) of the stool. You may have this test every year starting at age 55. Flexible sigmoidoscopy or colonoscopy. You may have a sigmoidoscopy every 5 years or a colonoscopy every 10 years starting at age 90. Prostate cancer screening. Recommendations will vary depending on your family history and other risks. Hepatitis C blood test. Hepatitis B blood test. Sexually transmitted disease (STD) testing. Diabetes screening. This is done by checking your blood sugar (glucose) after you have not eaten for a while (fasting). You may have this done every 1-3 years. Discuss your test results, treatment options, and if necessary, the need for more tests with your health care provider. Vaccines  Your health care provider may recommend certain vaccines, such as: Influenza vaccine. This is recommended every year. Tetanus, diphtheria, and acellular pertussis (Tdap, Td) vaccine. You may need a Td booster every 10 years. Zoster vaccine. You may need this after age 58. Pneumococcal 13-valent conjugate (PCV13) vaccine. You may need this if you have certain conditions and have not been vaccinated. Pneumococcal polysaccharide (PPSV23) vaccine. You  may need one or two doses if you smoke cigarettes or if you have certain conditions. Talk to your health care provider about which screenings and vaccines you need and how often you need them. This information is not intended to replace advice given to you by your health care provider.  Make sure you discuss any questions you have with your health care provider. Document Released: 03/26/2015 Document Revised: 11/17/2015 Document Reviewed: 12/29/2014 Elsevier Interactive Patient Education  2017 [employer].  Fall Prevention in the Home Falls can cause injuries. They can happen to people of all ages. There are many things you can do to make your home safe and to help prevent falls. What can I do on the outside of my home? Regularly fix the edges of walkways and driveways and fix any cracks. Remove anything that might make you trip as you walk through a door, such as a raised step or threshold. Trim any bushes or trees on the path to your home. Use bright outdoor lighting. Clear any walking paths of anything that might make someone trip, such as rocks or tools. Regularly check to see if handrails are loose or broken. Make sure that both sides of any steps have handrails. Any raised decks and porches should have guardrails on the edges. Have any leaves, snow, or ice cleared regularly. Use sand or salt on walking paths during winter. Clean up any spills in your garage right away. This includes oil or grease spills. What can I do in the bathroom? Use night lights. Install grab bars by the toilet and in the tub and shower. Do not use towel bars as grab bars. Use non-skid mats or decals in the tub or shower. If you need to sit down in the shower, use a plastic, non-slip stool. Keep the floor dry. Clean up any water that spills on the floor as soon as it happens. Remove soap buildup in the tub or shower regularly. Attach bath mats securely with double-sided non-slip rug tape. Do not have throw rugs and other things on the floor that can make you trip. What can I do in the bedroom? Use night lights. Make sure that you have a light by your bed that is easy to reach. Do not use any sheets or blankets that are too big for your bed. They should not hang down onto the floor. Have a  firm chair that has side arms. You can use this for support while you get dressed. Do not have throw rugs and other things on the floor that can make you trip. What can I do in the kitchen? Clean up any spills right away. Avoid walking on wet floors. Keep items that you use a lot in easy-to-reach places. If you need to reach something above you, use a strong step stool that has a grab bar. Keep electrical cords out of the way. Do not use floor polish or wax that makes floors slippery. If you must use wax, use non-skid floor wax. Do not have throw rugs and other things on the floor that can make you trip. What can I do with my stairs? Do not leave any items on the stairs. Make sure that there are handrails on both sides of the stairs and use them. Fix handrails that are broken or loose. Make sure that handrails are as long as the stairways. Check any carpeting to make sure that it is firmly attached to the stairs. Fix any carpet that is loose or worn. Avoid  having throw rugs at the top or bottom of the stairs. If you do have throw rugs, attach them to the floor with carpet tape. Make sure that you have a light switch at the top of the stairs and the bottom of the stairs. If you do not have them, ask someone to add them for you. What else can I do to help prevent falls? Wear shoes that: Do not have high heels. Have rubber bottoms. Are comfortable and fit you well. Are closed at the toe. Do not wear sandals. If you use a stepladder: Make sure that it is fully opened. Do not climb a closed stepladder. Make sure that both sides of the stepladder are locked into place. Ask someone to hold it for you, if possible. Clearly mark and make sure that you can see: Any grab bars or handrails. First and last steps. Where the edge of each step is. Use tools that help you move around (mobility aids) if they are needed. These include: Canes. Walkers. Scooters. Crutches. Turn on the lights when you  go into a dark area. Replace any light bulbs as soon as they burn out. Set up your furniture so you have a clear path. Avoid moving your furniture around. If any of your floors are uneven, fix them. If there are any pets around you, be aware of where they are. Review your medicines with your doctor. Some medicines can make you feel dizzy. This can increase your chance of falling. Ask your doctor what other things that you can do to help prevent falls. This information is not intended to replace advice given to you by your health care provider. Make sure you discuss any questions you have with your health care provider. Document Released: 12/24/2008 Document Revised: 08/05/2015 Document Reviewed: 04/03/2014 Elsevier Interactive Patient Education  2017 [employer].

## 2022-09-27 NOTE — Progress Notes (Signed)
Subjective:   Scott Foley is a 57 y.o. male who presents for Medicare Annual/Subsequent preventive examination.  This wellness visit is conducted by a nurse.  The patient's medications were reviewed and reconciled since the patient's last visit.  History details were provided by the patient.  The history appears to be reliable.    Medical History: Patient history and Family history was reviewed  Medications, Allergies, and preventative health maintenance was reviewed and updated.   Visit Complete: In person  Patient Medicare AWV questionnaire was completed by the patient on 09/25/22; I have confirmed that all information answered by patient is correct and no changes since this date.   Cardiac Risk Factors include: advanced age (>1men, >38 women);male gender;hypertension;obesity (BMI >30kg/m2)     Objective:    Today's Vitals   09/27/22 1338  BP: 122/80  Pulse: 76  Resp: 16  SpO2: 95%  Weight: 221 lb (100.2 kg)  Height: 5\' 4"  (1.626 m)  PainSc: 0-No pain   Body mass index is 37.93 kg/m.     04/14/2021    6:06 PM 02/20/2021   11:47 AM 02/09/2021    2:12 AM 10/25/2020    7:45 AM 08/19/2019    1:38 PM 04/26/2019    8:40 AM 03/03/2019    8:23 PM  Advanced Directives  Does Patient Have a Medical Advance Directive? No No No No No No No  Would patient like information on creating a medical advance directive? No - Patient declined No - Patient declined No - Patient declined  Yes (MAU/Ambulatory/Procedural Areas - Information given) No - Patient declined No - Patient declined    Current Medications (verified) Outpatient Encounter Medications as of 09/27/2022  Medication Sig   albuterol (VENTOLIN HFA) 108 (90 Base) MCG/ACT inhaler INHALE TWO PUFFS BY MOUTH INTO LUNGS every SIX hours AS NEEDED qhzsob   apixaban (ELIQUIS) 5 MG TABS tablet TAKE ONE TABLET BY MOUTH EVERY MORNING and TAKE ONE TABLET BY MOUTH EVERY EVENING   clopidogrel (PLAVIX) 75 MG tablet Take 1 tablet (75  mg total) by mouth daily.   cyanocobalamin (VITAMIN B12) 1000 MCG tablet TAKE ONE TABLET BY MOUTH EVERY MORNING   Fluticasone-Umeclidin-Vilant (TRELEGY ELLIPTA) 100-62.5-25 MCG/ACT AEPB Inhale 1 puff into the lungs daily.   metoprolol tartrate (LOPRESSOR) 50 MG tablet TAKE ONE TABLET BY MOUTH TWICE DAILY   Multiple Vitamin (MULTIVITAMIN WITH MINERALS) TABS tablet Take 1 tablet by mouth daily.   Omega-3 Fatty Acids (FISH OIL) 1000 MG CAPS Take 1 capsule (1,000 mg total) by mouth 2 (two) times daily.   ondansetron (ZOFRAN) 4 MG tablet Take 1 tablet (4 mg total) by mouth every 8 (eight) hours as needed for nausea or vomiting.   pantoprazole (PROTONIX) 40 MG tablet TAKE ONE TABLET BY MOUTH EVERY MORNING and TAKE ONE TABLET BY MOUTH EVERY EVENING   REPATHA SURECLICK 140 MG/ML SOAJ Inject 140 mg into the skin every 14 (fourteen) days.   rosuvastatin (CRESTOR) 40 MG tablet TAKE ONE TABLET BY MOUTH EVERY EVENING   valsartan (DIOVAN) 80 MG tablet Take 1 tablet (80 mg total) by mouth daily.   No facility-administered encounter medications on file as of 09/27/2022.    Allergies (verified) Penicillin g   History: Past Medical History:  Diagnosis Date   Acquired thrombophilia (HCC) 12/06/2020   B12 deficiency 07/04/2021   Chronic obstructive pulmonary disease (HCC) 07/16/2018   Class 2 severe obesity due to excess calories with serious comorbidity and body mass index (BMI) of 37.0 to  37.9 in adult (HCC) 12/06/2020   BMI 35 with serious comorbidities including CAD, HTN, STROKE, Hyperlipidemia.   Colon polyp 10/25/2020   serrated adenoma.   Coronary artery disease due to lipid rich plaque 01/01/2021   Coronary artery disease with stable angina pectoris [facility])    AMI No heart cath, treated medically [place] [facility]   Daytime somnolence 08/04/2022   Diverticulosis of colon 10/25/2020   Noted on colnoscopy by Dr Tobi Bastos in [place].   DNR (do not resuscitate) 03/03/2019   History of blood clotting disorder  06/11/2021   History of CVA (cerebrovascular accident) 07/16/2018   Formatting of this note might be different from the original. CVA 3 years ago left with short term memory deficits. Has been on Plavix since.   History of pulmonary embolism 02/15/2021   Hyperlipemia    Hypertensive heart disease without congestive heart failure 12/06/2020   Hypoglycemia 04/15/2021   Hypokalemia 04/13/2021   Hypomagnesemia 06/11/2021   Impingement of left shoulder 04/05/2022   Intra-abdominal fluid collection 07/18/2021   Medication monitoring encounter 06/24/2021   Mixed hyperlipidemia 07/22/2018   Morbid obesity (HCC) 12/06/2020   BMI 35 with serious comorbidities including CAD, HTN, STROKE, Hyperlipidemia.   Normocytic anemia 04/15/2021   Other fatigue 12/06/2020   Pericardial effusion 04/29/2021   Protein-calorie malnutrition, severe 04/15/2021   Pulmonary nodules/lesions, multiple 11/25/2012   9/15/2014CT Chest : small nodule RUL LUL. Stable since 03/2012 Smoking cessation advised and Rx chantix Repeat CT Chest 4 /21/15: subpleural lymph nodes, likely benign repeat one year 2016>>>no change, considered benign Cleda Daub 11/25/2012  NORMAL   Sequela, post-stroke 07/28/2019   Sleep apnea    CPAP   VRE (vancomycin-resistant Enterococci) infection 05/11/2021   Past Surgical History:  Procedure Laterality Date   COLONOSCOPY WITH PROPOFOL N/A 10/25/2020   Wyline Mood, MD, at [facility]. 25 MM serrated adenoma polyp at ascending colon removed and site clipped.  Pan diverticulosis.   IR CATHETER TUBE CHANGE  09/22/2021   IR CATHETER TUBE CHANGE  10/10/2021   IR RADIOLOGIST EVAL & MGMT  05/25/2021   IR RADIOLOGIST EVAL & MGMT  08/09/2021   IR RADIOLOGIST EVAL & MGMT  08/23/2021   IR RADIOLOGIST EVAL & MGMT  09/21/2021   IR RADIOLOGIST EVAL & MGMT  10/07/2021   LAPAROSCOPIC CHOLECYSTECTOMY  02/2021   PERICARDIOCENTESIS N/A 05/01/2021   Procedure: PERICARDIOCENTESIS;  Surgeon: [place], Peter M, MD;  Location: [facility]  INVASIVE CV LAB;  Service: Cardiovascular;  Laterality: N/A;   TONSILLECTOMY     Family History  Problem Relation Age of Onset   Hypertension Father    Heart attack Father    Stroke Father    Prostate cancer Father    Hypertension Sister    Lung cancer Sister    Cancer Brother        lung   Hypertension Brother    Heart attack Brother    Cancer - Lung Brother    Brain cancer Brother    Social History   Socioeconomic History   Marital status: Married    Spouse name: Dawn   Number of children: 2   Years of education: Not on file   Highest education level: GED or equivalent  Occupational History   Not on file  Tobacco Use   Smoking status: Former    Current packs/day: 0.00    Average packs/day: 0.8 packs/day for 41.7 years (31.2 ttl pk-yrs)    Types: Cigarettes    Start date: 03/14/1979    Quit date:  11/2020    Years since quitting: 1.8   Smokeless tobacco: Never  Vaping Use   Vaping status: Former   Start date: 07/28/2018  Substance and Sexual Activity   Alcohol use: No   Drug use: No   Sexual activity: Yes    Partners: Female  Other Topics Concern   Not on file  Social History Narrative   Not on file   Social Determinants of Health   Financial Resource Strain: Low Risk  (09/25/2022)   Overall Financial Resource Strain (CARDIA)    Difficulty of Paying Living Expenses: Not hard at all  Recent Concern: Financial Resource Strain - Medium Risk (06/29/2022)   Overall Financial Resource Strain (CARDIA)    Difficulty of Paying Living Expenses: Somewhat hard  Food Insecurity: No Food Insecurity (09/25/2022)   Hunger Vital Sign    Worried About Running Out of Food in the Last Year: Never true    Ran Out of Food in the Last Year: Never true  Transportation Needs: No Transportation Needs (09/25/2022)   PRAPARE - Administrator, Civil Service (Medical): No    Lack of Transportation (Non-Medical): No  Physical Activity: Insufficiently Active (09/25/2022)    Exercise Vital Sign    Days of Exercise per Week: 1 day    Minutes of Exercise per Session: 30 min  Stress: No Stress Concern Present (09/25/2022)   [employer] of Occupational Health - Occupational Stress Questionnaire    Feeling of Stress : Only a little  Social Connections: Moderately Isolated (09/25/2022)   Social Connection and Isolation Panel [NHANES]    Frequency of Communication with Friends and Family: More than three times a week    Frequency of Social Gatherings with Friends and Family: Once a week    Attends Religious Services: Never    Database administrator or Organizations: No    Attends Engineer, structural: Never    Marital Status: Married    Tobacco Counseling Counseling given: Not Answered   Clinical Intake:  Pre-visit preparation completed: Yes Pain : No/denies pain Pain Score: 0-No pain   Diabetes: No How often do you need to have someone help you when you read instructions, pamphlets, or other written materials from your doctor or pharmacy?: 1 - Never Interpreter Needed?: No     Activities of Daily Living    09/25/2022    1:46 PM  In your present state of health, do you have any difficulty performing the following activities:  Hearing? 0  Vision? 0  Difficulty concentrating or making decisions? 0  Walking or climbing stairs? 0  Dressing or bathing? 0  Doing errands, shopping? 0  Preparing Food and eating ? N  Using the Toilet? N  In the past six months, have you accidently leaked urine? N  Do you have problems with loss of bowel control? N  Managing your Medications? N  Managing your Finances? N  Housekeeping or managing your Housekeeping? N    Patient Care Team: Blane Ohara, MD as PCP - General (Family Medicine) Croitoru, Rachelle Hora, MD as PCP - Cardiology (Cardiology) Zettie Pho, [facility] (Inactive) as Pharmacist (Pharmacist) Marlowe Sax, RN as Case Manager (General Practice) Dagoberto Ligas, MD as Consulting Physician  (Nephrology)     Assessment:   This is a routine wellness examination for Effrey.  Hearing/Vision screen No results found.  Dietary issues and exercise activities discussed: Aim for 30 minutes of exercise or brisk walking, 6-8 glasses of water, and 5  servings of fruits and vegetables each day.     Depression Screen    09/27/2022    2:09 PM 08/04/2022    9:10 AM 02/24/2022    2:08 PM 09/12/2021    2:02 PM 06/22/2021    3:16 PM 12/30/2020    1:37 PM 08/23/2020    8:58 AM  PHQ 2/9 Scores  PHQ - 2 Score 0 0 0 0 0 0 0  PHQ- 9 Score 2 2         Fall Risk    09/25/2022    1:46 PM 08/04/2022    9:10 AM 02/24/2022    2:31 PM 09/12/2021    2:01 PM 08/01/2021   10:53 AM  Fall Risk   Falls in the past year? 0 0 0 1 1  Number falls in past yr: 0 0 0 0 0  Injury with Fall? 0 0 0 0 0  Risk for fall due to : No Fall Risks No Fall Risks No Fall Risks No Fall Risks   Follow up Falls evaluation completed Falls evaluation completed Falls evaluation completed;Education provided Falls evaluation completed;Falls prevention discussed Falls evaluation completed    MEDICARE RISK AT HOME:   TIMED UP AND GO:  Was the test performed?  Yes  Length of time to ambulate 10 feet: 5 sec Gait slow and steady without use of assistive device    Cognitive Function:        09/27/2022    2:23 PM 08/19/2019    2:07 PM  6CIT Screen  What Year? 0 points 0 points  What month? 0 points 0 points  What time? 0 points 0 points  Count back from 20 0 points 0 points  Months in reverse 2 points 4 points  Repeat phrase 2 points 4 points  Total Score 4 points 8 points    Immunizations Immunization History  Administered Date(s) Administered   Influenza Inj Mdck Quad Pf 12/15/2019, 12/30/2020, 12/01/2021   Influenza Split 01/03/2012   Influenza,inj,Quad PF,6+ Mos 11/25/2012   PNEUMOCOCCAL CONJUGATE-20 12/30/2020   Pneumococcal Polysaccharide-23 11/25/2012    TDAP status: Due, Education has been provided  regarding the importance of this vaccine. Advised may receive this vaccine at local pharmacy or Health Dept. Aware to provide a copy of the vaccination record if obtained from local pharmacy or Health Dept. Verbalized acceptance and understanding.  Flu Vaccine status: Up to date  Pneumococcal vaccine status: Up to date  Covid-19 vaccine status: Declined, Education has been provided regarding the importance of this vaccine but patient still declined. Advised may receive this vaccine at local pharmacy or Health Dept.or vaccine clinic. Aware to provide a copy of the vaccination record if obtained from local pharmacy or Health Dept. Verbalized acceptance and understanding.  Qualifies for Shingles Vaccine? Yes   Zostavax completed No   Shingrix Completed?: No.    Education has been provided regarding the importance of this vaccine. Patient has been advised to call insurance company to determine out of pocket expense if they have not yet received this vaccine. Advised may also receive vaccine at local pharmacy or Health Dept. Verbalized acceptance and understanding.  Screening Tests Health Maintenance  Topic Date Due   DTaP/Tdap/Td (1 - Tdap) Never done   Medicare Annual Wellness (AWV)  09/13/2022   COVID-19 Vaccine (1) 09/27/2023 (Originally 05/06/1970)   Zoster Vaccines- Shingrix (1 of 2) 09/27/2023 (Originally [birth year]1983)   INFLUENZA VACCINE  10/12/2022   Lung Cancer Screening  12/21/2022   Colonoscopy  10/26/2030   Hepatitis C Screening  Completed   HIV Screening  Completed   HPV VACCINES  Aged Out    Health Maintenance  Health Maintenance Due  Topic Date Due   DTaP/Tdap/Td (1 - Tdap) Never done   Medicare Annual Wellness (AWV)  09/13/2022    Colorectal cancer screening: Type of screening: Colonoscopy. Completed 10/2020.   Lung Cancer Screening: (Low Dose CT Chest recommended if Age 63-80 years, 20 pack-year currently smoking OR have quit w/in 15years.) does qualify.   Lung Cancer  Screening Referral: Due 12/2022  Additional Screening:  Vision Screening: Recommended annual ophthalmology exams for early detection of glaucoma and other disorders of the eye. Is the patient up to date with their annual eye exam?  No   Dental Screening: Recommended annual dental exams for proper oral hygiene   Community Resource Referral / Chronic Care Management: CRR required this visit?  No   CCM required this visit?  No     Plan:    1- COVID, Shingles, and Tetanus vaccines declined 2- Counseling was provided today regarding the following topics: healthy eating habits, home safety, vitamin and mineral supplementation (calcium and Vit D), regular exercise, tobacco avoidance, limitation of alcohol intake, use of seat belts, firearm safety, and fall prevention.  Annual recommendations include: influenza vaccine, dental cleanings, and eye exams.   I have personally reviewed and noted the following in the patient's chart:   Medical and social history Use of alcohol, tobacco or illicit drugs  Current medications and supplements including opioid prescriptions.  Functional ability and status Nutritional status Physical activity Advanced directives List of other physicians Hospitalizations, surgeries, and ER visits in previous 12 months Vitals Screenings to include cognitive, depression, and falls Referrals and appointments  In addition, I have reviewed and discussed with patient certain preventive protocols, quality metrics, and best practice recommendations. A written personalized care plan for preventive services as well as general preventive health recommendations were provided to patient.     Jacklynn Bue, LPN   [id number]   After Visit Summary: Available on MyChart

## 2022-09-29 ENCOUNTER — Ambulatory Visit (INDEPENDENT_AMBULATORY_CARE_PROVIDER_SITE_OTHER): Payer: Medicare HMO

## 2022-09-29 ENCOUNTER — Telehealth: Payer: Medicare HMO

## 2022-09-29 DIAGNOSIS — I119 Hypertensive heart disease without heart failure: Secondary | ICD-10-CM

## 2022-09-29 DIAGNOSIS — J449 Chronic obstructive pulmonary disease, unspecified: Secondary | ICD-10-CM

## 2022-09-29 NOTE — Chronic Care Management (AMB) (Signed)
Chronic Care Management   CCM RN Visit Note  09/29/2022 Name: Scott Foley MRN: [id number] DOB: 06-Aug-1965  Subjective: Scott Foley is a 57 y.o. year old male who is a primary care patient of [name], [name], MD. The patient was referred to the Chronic Care Management team for assistance with care management needs subsequent to provider initiation of CCM services and plan of care.    Today's Visit:  Engaged with patient by telephone for follow up visit.        Goals Addressed             This Visit's Progress    CCM Expected Outcome:  Monitor, Self-Manage and Reduce Symptoms of:HLD       Current Barriers:  Care Coordination needs related to medication needs and ongoing support and education from the pharm D for management of medications in a patient with HLD Chronic Disease Management support and education needs related to effective management of HLD   Lab Results  Component Value Date   CHOL 55 (L) 08/04/2022   HDL 33 (L) 08/04/2022   LDLCALC 2 08/04/2022   TRIG 107 08/04/2022   CHOLHDL 1.7 08/04/2022    Planned Interventions: Provider established cholesterol goals reviewed, Review of labs, education provided. The patient is compliant with the plan of care. The patient denies any new concerns with effective management of HLD ; Counseled on importance of regular laboratory monitoring as prescribed. The patient has labs on a regular basis. Labs are up to date; Provided HLD educational materials. Review of heart health and the importance of making sure he takes his medications and eating healthy.  Reviewed role and benefits of statin for ASCVD risk reduction; Discussed strategies to manage statin-induced myalgias. Working with the pharm D on assistance with Repatha. Takes Rosuvastatin 40 mg QD, Rapatha 140 mg every 14 days, and Fish Oil 1000 mg. Is compliant with medications. Works with the pharm D on a regular basis.  ; Reviewed importance of limiting foods high  in cholesterol. Education provided on a heart healthy diet. The patient only drinks coffee per his wife. Instructed on the need to drink water and stay hydrated for kidney health and overall health and well being.; Reviewed exercise goals and target of 150 minutes per week; Screening for signs and symptoms of depression related to chronic disease state;  Assessed social determinant of health barriers;  The patient states he is feeling more tired than usual. Discussed staying  hydrated, drinking more water and trying some protein shakes, and eating more fruits and vegetables. Education and support given.   Symptom Management: Take medications as prescribed   Attend all scheduled provider appointments Call provider office for new concerns or questions  call the Suicide and Crisis Lifeline: 988 call the [place] National Suicide Prevention Lifeline: (989)226-1891 or TTY: 804-599-1252 TTY (929)329-2520) to talk to a trained counselor call 1-800-273-TALK (toll free, 24 hour hotline) if experiencing a Mental Health or Behavioral Health Crisis  - call for medicine refill 2 or 3 days before it runs out - take all medications exactly as prescribed - call doctor with any symptoms you believe are related to your medicine - call doctor when you experience any new symptoms - go to all doctor appointments as scheduled - adhere to prescribed diet: heart healthy  Follow Up Plan: Telephone follow up appointment with care management team member scheduled for: 12-08-2022 at 230 pm       CCM Expected Outcome:  Monitor, Self-Manage, and Reduce  Symptoms of Hypertension       Current Barriers:  Chronic Disease Management support and education needs related to effective management of HTN  BP Readings from Last 3 Encounters:  09/27/22 122/80  09/08/22 130/80  08/04/22 132/82    Planned Interventions: Evaluation of current treatment plan related to hypertension self management and patient's adherence to plan as  established by provider. The patient blood pressures are more stable at this time. The patient has his medications and his blood pressures are consistently in the 130/80. Provided education to patient re: stroke prevention, s/s of heart attack and stroke. Review of the patient being at increase risk of heart attack and stroke due to elevated blood pressures. Review of monitoring for chest pain, arm pain, changes in the way her feels, headaches and to seek emergent help for sx and sx of pending heart attack or stroke. Will provide information in my chart for review and education. Reviewed prescribed diet heart healthy diet.The patient is compliant with heart healthy diet.  Reviewed medications with patient and discussed importance of compliance. The patient is compliant with medications, works with pharm D. The patient was prescribed Valsartan 80 mg but has not received yet.The patient states that he has worked with pharm D in the past. Review of updates with pharmacy and the patient will let the RNCM know of new needs. Discussed plans with patient for ongoing care management follow up and provided patient with direct contact information for care management team; Advised patient, providing education and rationale, to monitor blood pressure daily and record, calling PCP for findings outside established parameters;  Reviewed scheduled/upcoming provider appointments including: 11-24-2022 at 940 am. Has sent a message to the pcp and admin staff asking for help with getting a sooner appointment due to elevated blood pressures.  Advised patient to discuss changes in heart health, questions or concerns  with provider; Provided education on prescribed diet heart healthy diet. Education on staying hydrated and monitoring foods high in potassium;  Discussed complications of poorly controlled blood pressure such as heart disease, stroke, circulatory complications, vision complications, kidney impairment, sexual  dysfunction;  Screening for signs and symptoms of depression related to chronic disease state;  Assessed social determinant of health barriers;   Symptom Management: Take medications as prescribed   Attend all scheduled provider appointments Call provider office for new concerns or questions  call the Suicide and Crisis Lifeline: 988 call the [place] National Suicide Prevention Lifeline: 984-598-1810 or TTY: (380)036-1270 TTY [phone]) to talk to a trained counselor call 1-800-273-TALK (toll free, 24 hour hotline) if experiencing a Mental Health or Behavioral Health Crisis  check blood pressure weekly learn about high blood pressure call doctor for signs and symptoms of high blood pressure keep all doctor appointments take medications for blood pressure exactly as prescribed report new symptoms to your doctor  Follow Up Plan: Telephone follow up appointment with care management team member scheduled for: 12-08-2022 at 230 pm       CCM:  Maintain, Monitor and Self-Manage Symptoms of COPD       Current Barriers:  Chronic Disease Management support and education needs related to effective management of COPD  Planned Interventions: Provided patient with basic written and verbal COPD education on self care/management/and exacerbation prevention. The patient denies any issues with his COPD at this time. Is compliant with his plan of care.Denies any issues with his COPD. Reminder given to stay hydrated and monitor for triggers that impact his breathing. Education and support given.  Advised patient to track and manage COPD triggers. Knows triggers and factors that cause exacerbation. Denies any acute changes in his COPD. Review of protecting self from high pollen count and also weather changes that cause exacerbation. Provided written and verbal instructions on pursed lip breathing and utilized returned demonstration as teach back Provided instruction about proper use of medications used for  management of COPD including inhalers. The patient is compliant with medications. Denies any new concerns with medications Advised patient to self assesses COPD action plan zone and make appointment with provider if in the yellow zone for 48 hours without improvement Advised patient to engage in light exercise as tolerated 3-5 days a week to aid in the the management of COPD Provided education about and advised patient to utilize infection prevention strategies to reduce risk of respiratory infection. Education and support provided.  Discussed the importance of adequate rest and management of fatigue with COPD Screening for signs and symptoms of depression related to chronic disease state  Assessed social determinant of health barriers  Symptom Management: Take medications as prescribed   Attend all scheduled provider appointments Call provider office for new concerns or questions  call the Suicide and Crisis Lifeline: 988 call the [place] National Suicide Prevention Lifeline: (580)523-3826 or TTY: 253-874-1190 TTY [phone]) to talk to a trained counselor call 1-800-273-TALK (toll free, 24 hour hotline) if experiencing a Mental Health or Behavioral Health Crisis  avoid second hand smoke eliminate smoking in my home identify and remove indoor air pollutants limit outdoor activity during cold weather listen for public air quality announcements every day do breathing exercises every day develop a rescue plan eliminate symptom triggers at home follow rescue plan if symptoms flare-up  Follow Up Plan: Telephone follow up appointment with care management team member scheduled for: 12-08-2022 at 230 pm          Plan:Telephone follow up appointment with care management team member scheduled for:  12-08-2022 at 230 pm  Alto Denver RN, MSN, CCM RN Care Manager  Chronic Care Management Direct Number: [phone]

## 2022-09-29 NOTE — Patient Instructions (Signed)
Please call the care guide team at [phone] if you need to cancel or reschedule your appointment.   If you are experiencing a Mental Health or Behavioral Health Crisis or need someone to talk to, please call the Suicide and Crisis Lifeline: 988 call the [place] National Suicide Prevention Lifeline: 9397896354 or TTY: [phone] TTY [phone]) to talk to a trained counselor call 1-800-273-TALK (toll free, 24 hour hotline) go to [facility] [address] [employer] [phone])   Following is a copy of the CCM Program Consent:  CCM service includes personalized support from designated clinical staff supervised by the physician, including individualized plan of care and coordination with other care providers 24/7 contact phone numbers for assistance for urgent and routine care needs. Service will only be billed when office clinical staff spend 20 minutes or more in a month to coordinate care. Only one practitioner may furnish and bill the service in a calendar month. The patient may stop CCM services at amy time (effective at the end of the month) by phone call to the office staff. The patient will be responsible for cost sharing (co-pay) or up to 20% of the service fee (after annual deductible is met)  Following is a copy of your full provider care plan:   Goals Addressed             This Visit's Progress    CCM Expected Outcome:  Monitor, Self-Manage and Reduce Symptoms of:HLD       Current Barriers:  Care Coordination needs related to medication needs and ongoing support and education from the pharm D for management of medications in a patient with HLD Chronic Disease Management support and education needs related to effective management of HLD   Lab Results  Component Value Date   CHOL 55 (L) 08/04/2022   HDL 33 (L) 08/04/2022   LDLCALC 2 08/04/2022   TRIG 107 08/04/2022   CHOLHDL 1.7 08/04/2022    Planned  Interventions: Provider established cholesterol goals reviewed, Review of labs, education provided. The patient is compliant with the plan of care. The patient denies any new concerns with effective management of HLD ; Counseled on importance of regular laboratory monitoring as prescribed. The patient has labs on a regular basis. Labs are up to date; Provided HLD educational materials. Review of heart health and the importance of making sure he takes his medications and eating healthy.  Reviewed role and benefits of statin for ASCVD risk reduction; Discussed strategies to manage statin-induced myalgias. Working with the pharm D on assistance with Repatha. Takes Rosuvastatin 40 mg QD, Rapatha 140 mg every 14 days, and Fish Oil 1000 mg. Is compliant with medications. Works with the pharm D on a regular basis.  ; Reviewed importance of limiting foods high in cholesterol. Education provided on a heart healthy diet. The patient only drinks coffee per his wife. Instructed on the need to drink water and stay hydrated for kidney health and overall health and well being.; Reviewed exercise goals and target of 150 minutes per week; Screening for signs and symptoms of depression related to chronic disease state;  Assessed social determinant of health barriers;  The patient states he is feeling more tired than usual. Discussed staying  hydrated, drinking more water and trying some protein shakes, and eating more fruits and vegetables. Education and support given.   Symptom Management: Take medications as prescribed   Attend all scheduled provider appointments Call provider office for new concerns or questions  call  the Suicide and Crisis Lifeline: 988 call the [place] National Suicide Prevention Lifeline: 760-744-2746 or TTY: 5206370598 TTY (859)073-2527) to talk to a trained counselor call 1-800-273-TALK (toll free, 24 hour hotline) if experiencing a Mental Health or Behavioral Health Crisis  - call for  medicine refill 2 or 3 days before it runs out - take all medications exactly as prescribed - call doctor with any symptoms you believe are related to your medicine - call doctor when you experience any new symptoms - go to all doctor appointments as scheduled - adhere to prescribed diet: heart healthy  Follow Up Plan: Telephone follow up appointment with care management team member scheduled for: 12-08-2022 at 230 pm       CCM Expected Outcome:  Monitor, Self-Manage, and Reduce Symptoms of Hypertension       Current Barriers:  Chronic Disease Management support and education needs related to effective management of HTN  BP Readings from Last 3 Encounters:  09/27/22 122/80  09/08/22 130/80  08/04/22 132/82    Planned Interventions: Evaluation of current treatment plan related to hypertension self management and patient's adherence to plan as established by provider. The patient blood pressures are more stable at this time. The patient has his medications and his blood pressures are consistently in the 130/80. Provided education to patient re: stroke prevention, s/s of heart attack and stroke. Review of the patient being at increase risk of heart attack and stroke due to elevated blood pressures. Review of monitoring for chest pain, arm pain, changes in the way her feels, headaches and to seek emergent help for sx and sx of pending heart attack or stroke. Will provide information in my chart for review and education. Reviewed prescribed diet heart healthy diet.The patient is compliant with heart healthy diet.  Reviewed medications with patient and discussed importance of compliance. The patient is compliant with medications, works with pharm D. The patient was prescribed Valsartan 80 mg but has not received yet.The patient states that he has worked with pharm D in the past. Review of updates with pharmacy and the patient will let the RNCM know of new needs. Discussed plans with patient for  ongoing care management follow up and provided patient with direct contact information for care management team; Advised patient, providing education and rationale, to monitor blood pressure daily and record, calling PCP for findings outside established parameters;  Reviewed scheduled/upcoming provider appointments including: 11-24-2022 at 940 am. Has sent a message to the pcp and admin staff asking for help with getting a sooner appointment due to elevated blood pressures.  Advised patient to discuss changes in heart health, questions or concerns  with provider; Provided education on prescribed diet heart healthy diet. Education on staying hydrated and monitoring foods high in potassium;  Discussed complications of poorly controlled blood pressure such as heart disease, stroke, circulatory complications, vision complications, kidney impairment, sexual dysfunction;  Screening for signs and symptoms of depression related to chronic disease state;  Assessed social determinant of health barriers;   Symptom Management: Take medications as prescribed   Attend all scheduled provider appointments Call provider office for new concerns or questions  call the Suicide and Crisis Lifeline: 988 call the [place] National Suicide Prevention Lifeline: 570-229-8317 or TTY: 2313791135 TTY [phone]) to talk to a trained counselor call 1-800-273-TALK (toll free, 24 hour hotline) if experiencing a Mental Health or Behavioral Health Crisis  check blood pressure weekly learn about high blood pressure call doctor for signs and symptoms of high blood  pressure keep all doctor appointments take medications for blood pressure exactly as prescribed report new symptoms to your doctor  Follow Up Plan: Telephone follow up appointment with care management team member scheduled for: 12-08-2022 at 230 pm       CCM:  Maintain, Monitor and Self-Manage Symptoms of COPD       Current Barriers:  Chronic Disease Management  support and education needs related to effective management of COPD  Planned Interventions: Provided patient with basic written and verbal COPD education on self care/management/and exacerbation prevention. The patient denies any issues with his COPD at this time. Is compliant with his plan of care.Denies any issues with his COPD. Reminder given to stay hydrated and monitor for triggers that impact his breathing. Education and support given. Advised patient to track and manage COPD triggers. Knows triggers and factors that cause exacerbation. Denies any acute changes in his COPD. Review of protecting self from high pollen count and also weather changes that cause exacerbation. Provided written and verbal instructions on pursed lip breathing and utilized returned demonstration as teach back Provided instruction about proper use of medications used for management of COPD including inhalers. The patient is compliant with medications. Denies any new concerns with medications Advised patient to self assesses COPD action plan zone and make appointment with provider if in the yellow zone for 48 hours without improvement Advised patient to engage in light exercise as tolerated 3-5 days a week to aid in the the management of COPD Provided education about and advised patient to utilize infection prevention strategies to reduce risk of respiratory infection. Education and support provided.  Discussed the importance of adequate rest and management of fatigue with COPD Screening for signs and symptoms of depression related to chronic disease state  Assessed social determinant of health barriers  Symptom Management: Take medications as prescribed   Attend all scheduled provider appointments Call provider office for new concerns or questions  call the Suicide and Crisis Lifeline: 988 call the [place] National Suicide Prevention Lifeline: 602-606-5591 or TTY: 636-028-2585 TTY [phone]) to talk to a trained  counselor call 1-800-273-TALK (toll free, 24 hour hotline) if experiencing a Mental Health or Behavioral Health Crisis  avoid second hand smoke eliminate smoking in my home identify and remove indoor air pollutants limit outdoor activity during cold weather listen for public air quality announcements every day do breathing exercises every day develop a rescue plan eliminate symptom triggers at home follow rescue plan if symptoms flare-up  Follow Up Plan: Telephone follow up appointment with care management team member scheduled for: 12-08-2022 at 230 pm          Patient verbalizes understanding of instructions and care plan provided today and agrees to view in MyChart. Active MyChart status and patient understanding of how to access instructions and care plan via MyChart confirmed with patient.  Telephone follow up appointment with care management team member scheduled for: 12-08-2022 at 230 pm

## 2022-10-11 DIAGNOSIS — J449 Chronic obstructive pulmonary disease, unspecified: Secondary | ICD-10-CM | POA: Diagnosis not present

## 2022-10-11 DIAGNOSIS — E785 Hyperlipidemia, unspecified: Secondary | ICD-10-CM | POA: Diagnosis not present

## 2022-10-11 DIAGNOSIS — I10 Essential (primary) hypertension: Secondary | ICD-10-CM | POA: Diagnosis not present

## 2022-10-19 DIAGNOSIS — G4733 Obstructive sleep apnea (adult) (pediatric): Secondary | ICD-10-CM | POA: Diagnosis not present

## 2022-11-07 ENCOUNTER — Other Ambulatory Visit: Payer: Self-pay | Admitting: Family Medicine

## 2022-11-07 DIAGNOSIS — I2782 Chronic pulmonary embolism: Secondary | ICD-10-CM

## 2022-11-19 DIAGNOSIS — G4733 Obstructive sleep apnea (adult) (pediatric): Secondary | ICD-10-CM | POA: Diagnosis not present

## 2022-11-22 ENCOUNTER — Encounter: Payer: Self-pay | Admitting: Family Medicine

## 2022-11-22 ENCOUNTER — Ambulatory Visit (INDEPENDENT_AMBULATORY_CARE_PROVIDER_SITE_OTHER): Payer: Medicare PPO | Admitting: Family Medicine

## 2022-11-22 VITALS — BP 127/78 | HR 79 | Temp 97.4°F | Resp 14 | Ht 64.0 in | Wt 231.6 lb

## 2022-11-22 DIAGNOSIS — J410 Simple chronic bronchitis: Secondary | ICD-10-CM

## 2022-11-22 DIAGNOSIS — J4489 Other specified chronic obstructive pulmonary disease: Secondary | ICD-10-CM | POA: Diagnosis not present

## 2022-11-22 DIAGNOSIS — I25118 Atherosclerotic heart disease of native coronary artery with other forms of angina pectoris: Secondary | ICD-10-CM | POA: Diagnosis not present

## 2022-11-22 DIAGNOSIS — Z23 Encounter for immunization: Secondary | ICD-10-CM | POA: Diagnosis not present

## 2022-11-22 DIAGNOSIS — D6869 Other thrombophilia: Secondary | ICD-10-CM | POA: Diagnosis not present

## 2022-11-22 DIAGNOSIS — N1832 Chronic kidney disease, stage 3b: Secondary | ICD-10-CM | POA: Diagnosis not present

## 2022-11-22 DIAGNOSIS — I1310 Hypertensive heart and chronic kidney disease without heart failure, with stage 1 through stage 4 chronic kidney disease, or unspecified chronic kidney disease: Secondary | ICD-10-CM | POA: Diagnosis not present

## 2022-11-22 DIAGNOSIS — Z86711 Personal history of pulmonary embolism: Secondary | ICD-10-CM

## 2022-11-22 DIAGNOSIS — J449 Chronic obstructive pulmonary disease, unspecified: Secondary | ICD-10-CM

## 2022-11-22 DIAGNOSIS — E66812 Obesity, class 2: Secondary | ICD-10-CM

## 2022-11-22 DIAGNOSIS — Z6839 Body mass index (BMI) 39.0-39.9, adult: Secondary | ICD-10-CM | POA: Diagnosis not present

## 2022-11-22 DIAGNOSIS — I119 Hypertensive heart disease without heart failure: Secondary | ICD-10-CM

## 2022-11-22 NOTE — Progress Notes (Signed)
Subjective:  Patient ID: Scott Foley, male    DOB: Jul 08, 1965  Age: 57 y.o. MRN: [id number]  Chief Complaint  Patient presents with   COPD   Hyperlipidemia    HPI Hypertension with stage 3B CKD: Patient is taking valsartan 80 mg daily, Metoprolol 50 mg twice a day. Saw Dr. Allena Katz.    CAD: He is on Plavix 75 mg daily, metoprolol 50 mg twice daily, rosuvastatin 40  mg daily at bedtime.  History of PE/DVT. Numerous. On eliquis 5 mg twice daily.   Hyperlipidemia: Rosuvastatin 40 mg daily at bedtime, Repatha 140 mg every 14 days, Fish Oil 1000 mg twice a day.  COPD:  Trelegy 1 puff daily, Albuterol 108 mcg inhaler 2 puff every 6 hours PRN.  GERD: Pantoprazole 40 mg twice daily.   Patient is eating healthy and some exercising.   OSA: on CPAP. Can tell an improvement in sleep and energy. Is compliant with cpap.   B12 deficiency : on B12 1000 mcg once daily       09/27/2022    2:09 PM 08/04/2022    9:10 AM 02/24/2022    2:08 PM 09/12/2021    2:02 PM 06/22/2021    3:16 PM  Depression screen PHQ 2/9  Decreased Interest 0 0 0 0 0  Down, Depressed, Hopeless 0 0 0 0 0  PHQ - 2 Score 0 0 0 0 0  Altered sleeping 0 0     Tired, decreased energy 2 2     Change in appetite 0 0     Feeling bad or failure about yourself  0 0     Trouble concentrating 0 0     Moving slowly or fidgety/restless 0 0     Suicidal thoughts 0 0     PHQ-9 Score 2 2     Difficult doing work/chores Not difficult at all Not difficult at all           09/25/2022    1:46 PM  Fall Risk   Falls in the past year? 0  Number falls in past yr: 0  Injury with Fall? 0  Risk for fall due to : No Fall Risks  Follow up Falls evaluation completed    Patient Care Team: Blane Ohara, MD as PCP - General (Family Medicine) Croitoru, Rachelle Hora, MD as PCP - Cardiology (Cardiology) Marlowe Sax, RN as Case Manager (General Practice) Dagoberto Ligas, MD as Consulting Physician (Nephrology)   Review of Systems   Constitutional:  Negative for chills, fatigue and fever.  HENT:  Negative for congestion, ear pain and sore throat.   Respiratory:  Negative for cough and shortness of breath.   Cardiovascular:  Negative for chest pain.  Gastrointestinal:  Negative for abdominal pain, constipation, diarrhea, nausea and vomiting.  Genitourinary:  Negative for dysuria and frequency.  Musculoskeletal:  Negative for arthralgias and myalgias.  Neurological:  Negative for dizziness and headaches.  Psychiatric/Behavioral:  Negative for dysphoric mood.        No dysphoria    Current Outpatient Medications on File Prior to Visit  Medication Sig Dispense Refill   albuterol (VENTOLIN HFA) 108 (90 Base) MCG/ACT inhaler INHALE TWO PUFFS BY MOUTH INTO LUNGS every SIX hours AS NEEDED qhzsob 8.5 g 2   clopidogrel (PLAVIX) 75 MG tablet TAKE ONE TABLET BY MOUTH EVERY MORNING 90 tablet 1   cyanocobalamin (VITAMIN B12) 1000 MCG tablet TAKE ONE TABLET BY MOUTH EVERY MORNING 90 tablet 3   ELIQUIS  5 MG TABS tablet TAKE ONE TABLET BY MOUTH TWICE DAILY 180 tablet 1   Fluticasone-Umeclidin-Vilant (TRELEGY ELLIPTA) 100-62.5-25 MCG/ACT AEPB Inhale 1 puff into the lungs daily. 1 each 11   metoprolol tartrate (LOPRESSOR) 50 MG tablet TAKE ONE TABLET BY MOUTH TWICE DAILY 180 tablet 1   Multiple Vitamin (MULTIVITAMIN WITH MINERALS) TABS tablet Take 1 tablet by mouth daily.     Omega-3 Fatty Acids (FISH OIL) 1000 MG CAPS Take 1 capsule (1,000 mg total) by mouth 2 (two) times daily. 180 capsule 0   ondansetron (ZOFRAN) 4 MG tablet Take 1 tablet (4 mg total) by mouth every 8 (eight) hours as needed for nausea or vomiting. 20 tablet 0   pantoprazole (PROTONIX) 40 MG tablet TAKE ONE TABLET BY MOUTH EVERY MORNING and TAKE ONE TABLET BY MOUTH EVERY EVENING 180 tablet 1   REPATHA SURECLICK 140 MG/ML SOAJ Inject 140 mg into the skin every 14 (fourteen) days. 2 mL 3   rosuvastatin (CRESTOR) 40 MG tablet TAKE ONE TABLET BY MOUTH EVERY EVENING 90  tablet 1   valsartan (DIOVAN) 80 MG tablet Take 1 tablet (80 mg total) by mouth daily. 90 tablet 3   No current facility-administered medications on file prior to visit.   Past Medical History:  Diagnosis Date   Acquired thrombophilia (HCC) 12/06/2020   B12 deficiency 07/04/2021   Chronic obstructive pulmonary disease (HCC) 07/16/2018   Class 2 severe obesity due to excess calories with serious comorbidity and body mass index (BMI) of 37.0 to 37.9 in adult (HCC) 12/06/2020   BMI 35 with serious comorbidities including CAD, HTN, STROKE, Hyperlipidemia.   Colon polyp 10/25/2020   serrated adenoma.   Coronary artery disease due to lipid rich plaque 01/01/2021   Coronary artery disease with stable angina pectoris [facility])    AMI No heart cath, treated medically [place]   Daytime somnolence 08/04/2022   Diverticulosis of colon 10/25/2020   Noted on colnoscopy by Dr Tobi Bastos in [place].   DNR (do not resuscitate) 03/03/2019   History of blood clotting disorder 06/11/2021   History of CVA (cerebrovascular accident) 07/16/2018   Formatting of this note might be different from the original. CVA 3 years ago left with short term memory deficits. Has been on Plavix since.   History of pulmonary embolism 02/15/2021   Hyperlipemia    Hypertensive heart disease without congestive heart failure 12/06/2020   Hypoglycemia 04/15/2021   Hypokalemia 04/13/2021   Hypomagnesemia 06/11/2021   Impingement of left shoulder 04/05/2022   Intra-abdominal fluid collection 07/18/2021   Medication monitoring encounter 06/24/2021   Mixed hyperlipidemia 07/22/2018   Morbid obesity (HCC) 12/06/2020   BMI 35 with serious comorbidities including CAD, HTN, STROKE, Hyperlipidemia.   Normocytic anemia 04/15/2021   Other fatigue 12/06/2020   Pericardial effusion 04/29/2021   Protein-calorie malnutrition, severe 04/15/2021   Pulmonary nodules/lesions, multiple 11/25/2012   9/15/2014CT Chest : small nodule RUL LUL. Stable  since 03/2012 Smoking cessation advised and Rx chantix Repeat CT Chest 4 /21/15: subpleural lymph nodes, likely benign repeat one year 2016>>>no change, considered benign Cleda Daub 11/25/2012  NORMAL   Sequela, post-stroke 07/28/2019   Sleep apnea    CPAP   VRE (vancomycin-resistant Enterococci) infection 05/11/2021   Past Surgical History:  Procedure Laterality Date   COLONOSCOPY WITH PROPOFOL N/A 10/25/2020   Wyline Mood, MD, at [facility]. 25 MM serrated adenoma polyp at ascending colon removed and site clipped.  Pan diverticulosis.   IR CATHETER TUBE CHANGE  09/22/2021  IR CATHETER TUBE CHANGE  10/10/2021   IR RADIOLOGIST EVAL & MGMT  05/25/2021   IR RADIOLOGIST EVAL & MGMT  08/09/2021   IR RADIOLOGIST EVAL & MGMT  08/23/2021   IR RADIOLOGIST EVAL & MGMT  09/21/2021   IR RADIOLOGIST EVAL & MGMT  10/07/2021   LAPAROSCOPIC CHOLECYSTECTOMY  02/2021   PERICARDIOCENTESIS N/A 05/01/2021   Procedure: PERICARDIOCENTESIS;  Surgeon: [place], Peter M, MD;  Location: [facility] INVASIVE CV LAB;  Service: Cardiovascular;  Laterality: N/A;   TONSILLECTOMY      Family History  Problem Relation Age of Onset   Pulmonary embolism Mother    Hypertension Father    Heart attack Father    Stroke Father    Prostate cancer Father    Lung disease Father    Cancer Sister 33       lung cancer   Hypertension Sister    Cancer Sister 25       lung cancer   Lung cancer Sister    Cancer Brother        lung   Hypertension Brother    Heart disease Brother    Cancer Brother 62       throat and lung cancer   Heart attack Brother    Cancer - Lung Brother    Brain cancer Brother    Social History   Socioeconomic History   Marital status: Married    Spouse name: Dawn   Number of children: 2   Years of education: Not on file   Highest education level: GED or equivalent  Occupational History   Not on file  Tobacco Use   Smoking status: Former    Current packs/day: 0.00    Average packs/day: 0.8 packs/day for 41.7 years  (31.2 ttl pk-yrs)    Types: Cigarettes    Start date: 03/14/1979    Quit date: 11/2020    Years since quitting: 2.0   Smokeless tobacco: Never  Vaping Use   Vaping status: Former   Start date: 07/28/2018  Substance and Sexual Activity   Alcohol use: No   Drug use: No   Sexual activity: Yes    Partners: Female  Other Topics Concern   Not on file  Social History Narrative   Not on file   Social Determinants of Health   Financial Resource Strain: Low Risk  (09/25/2022)   Overall Financial Resource Strain (CARDIA)    Difficulty of Paying Living Expenses: Not hard at all  Recent Concern: Financial Resource Strain - Medium Risk (06/29/2022)   Overall Financial Resource Strain (CARDIA)    Difficulty of Paying Living Expenses: Somewhat hard  Food Insecurity: No Food Insecurity (09/25/2022)   Hunger Vital Sign    Worried About Running Out of Food in the Last Year: Never true    Ran Out of Food in the Last Year: Never true  Transportation Needs: No Transportation Needs (09/25/2022)   PRAPARE - Administrator, Civil Service (Medical): No    Lack of Transportation (Non-Medical): No  Physical Activity: Insufficiently Active (09/25/2022)   Exercise Vital Sign    Days of Exercise per Week: 1 day    Minutes of Exercise per Session: 30 min  Stress: No Stress Concern Present (09/25/2022)   [employer] of Occupational Health - Occupational Stress Questionnaire    Feeling of Stress : Only a little  Social Connections: Moderately Isolated (09/25/2022)   Social Connection and Isolation Panel [NHANES]    Frequency of Communication with Friends  and Family: More than three times a week    Frequency of Social Gatherings with Friends and Family: Once a week    Attends Religious Services: Never    Diplomatic Services operational officer: No    Attends Engineer, structural: Never    Marital Status: Married    Objective:  BP 127/78   Pulse 79   Temp (!) 97.4 F (36.3 C)    Resp 14   Ht 5\' 4"  (1.626 m)   Wt 231 lb 9.6 oz (105.1 kg)   SpO2 95%   BMI 39.75 kg/m      11/22/2022    2:59 PM 09/27/2022    1:38 PM 09/08/2022   10:20 AM  BP/Weight  Systolic BP 127 122 130  Diastolic BP 78 80 80  Wt. (Lbs) 231.6 221 221  BMI 39.75 kg/m2 37.93 kg/m2 37.93 kg/m2   Physical Exam Vitals reviewed.  Constitutional:      Appearance: Normal appearance. He is obese.  Neck:     Vascular: No carotid bruit.  Cardiovascular:     Rate and Rhythm: Normal rate and regular rhythm.     Pulses: Normal pulses.     Heart sounds: Normal heart sounds.  Pulmonary:     Effort: Pulmonary effort is normal.     Breath sounds: Normal breath sounds. No wheezing, rhonchi or rales.  Abdominal:     General: Bowel sounds are normal.     Palpations: Abdomen is soft.     Tenderness: There is no abdominal tenderness.  Neurological:     Mental Status: He is alert and oriented to person, place, and time.  Psychiatric:        Mood and Affect: Mood normal.        Behavior: Behavior normal.     Diabetic Foot Exam - Simple   No data filed      Lab Results  Component Value Date   WBC 10.5 11/22/2022   HGB 14.7 11/22/2022   HCT 43.2 11/22/2022   PLT 316 11/22/2022   GLUCOSE 98 11/22/2022   CHOL 78 (L) 11/22/2022   TRIG 297 (H) 11/22/2022   HDL 23 (L) 11/22/2022   LDLCALC 12 11/22/2022   ALT 24 11/22/2022   AST 20 11/22/2022   NA 141 11/22/2022   K 4.9 11/22/2022   CL 103 11/22/2022   CREATININE 1.35 (H) 11/22/2022   BUN 24 11/22/2022   CO2 24 11/22/2022   TSH 3.170 04/05/2022   INR 1.2 07/21/2021      Assessment & Plan:   Hypertensive heart disease without congestive heart failure Assessment & Plan: The current medical regimen is effective;  continue present plan and medications. Continue valsartan 80 mg daily, metoprolol, crestor, plavix, and repatha.   Orders: -     Lipid panel  Chronic obstructive pulmonary disease, unspecified COPD type (HCC) Assessment &  Plan: The current medical regimen is effective;  continue present plan and medications.  Trelegy 1 puff daily, Albuterol 108 mcg inhaler 2 puff every 6 hours PRN.    Coronary artery disease of native artery of native heart with stable angina pectoris [facility]) Assessment & Plan: Well controlled.  No changes to medicines.  Continue Plavix 75 mg daily, metoprolol 50 mg twice daily, rosuvastatin 40  mg daily at bedtime, Repatha 140 mg every 14 days, Fish Oil 1000 mg twice a day. Continue to work on eating a healthy diet and exercise.  Labs drawn today.    Orders: -  CBC with Differential/Platelet -     Comprehensive metabolic panel  Acquired thrombophilia (HCC) Assessment & Plan: Due to eliquis   Simple chronic bronchitis (HCC) Assessment & Plan: The current medical regimen is effective;  continue present plan and medications.  Trelegy 1 puff daily, Albuterol 108 mcg inhaler 2 puff every 6 hours PRN.    Encounter for immunization -     Influenza, MDCK, trivalent, PF(Flucelvax egg-free)  Stage 3b chronic kidney disease (CKD) (HCC) Assessment & Plan: Greatly improved. Currently returned to stage 2.    Class 2 severe obesity due to excess calories with serious comorbidity and body mass index (BMI) of 39.0 to 39.9 in adult [facility]) Assessment & Plan: Recommend continue to work on eating healthy diet and exercise. erious comorbidities including CAD, HTN, STROKE, Hyperlipidemia.     History of pulmonary embolism Assessment & Plan: Lifelong eliquis due to recurrent dvt/PE.       No orders of the defined types were placed in this encounter.   Orders Placed This Encounter  Procedures   Influenza, MDCK, trivalent, PF(Flucelvax egg-free)   CBC with Differential/Platelet   Comprehensive metabolic panel   Lipid panel     Follow-up: No follow-ups on file.   I,Lemmie Vanlanen,acting as a Neurosurgeon for Blane Ohara, MD.,have documented all relevant documentation on the behalf of  Blane Ohara, MD,as directed by  Blane Ohara, MD while in the presence of Blane Ohara, MD.   An After Visit Summary was printed and given to the patient.  Blane Ohara, MD [facility] [phone]

## 2022-11-23 LAB — LIPID PANEL
Chol/HDL Ratio: 3.4 ratio (ref 0.0–5.0)
Cholesterol, Total: 78 mg/dL — ABNORMAL LOW (ref 100–199)
HDL: 23 mg/dL — ABNORMAL LOW (ref >39)
LDL Chol Calc (NIH): 12 mg/dL (ref 0–99)
Triglycerides: 297 mg/dL — ABNORMAL HIGH (ref 0–149)
VLDL Cholesterol Cal: 43 mg/dL — ABNORMAL HIGH (ref 5–40)

## 2022-11-23 LAB — CBC WITH DIFFERENTIAL/PLATELET
Basophils Absolute: 0.1 10*3/uL (ref 0.0–0.2)
Basos: 1 %
EOS (ABSOLUTE): 0.2 10*3/uL (ref 0.0–0.4)
Eos: 2 %
Hematocrit: 43.2 % (ref 37.5–51.0)
Hemoglobin: 14.7 g/dL (ref 13.0–17.7)
Immature Grans (Abs): 0 10*3/uL (ref 0.0–0.1)
Immature Granulocytes: 0 %
Lymphocytes Absolute: 3.5 10*3/uL — ABNORMAL HIGH (ref 0.7–3.1)
Lymphs: 33 %
MCH: 29.8 pg (ref 26.6–33.0)
MCHC: 34 g/dL (ref 31.5–35.7)
MCV: 87 fL (ref 79–97)
Monocytes Absolute: 0.9 10*3/uL (ref 0.1–0.9)
Monocytes: 9 %
Neutrophils Absolute: 5.8 10*3/uL (ref 1.4–7.0)
Neutrophils: 55 %
Platelets: 316 10*3/uL (ref 150–450)
RBC: 4.94 x10E6/uL (ref 4.14–5.80)
RDW: 14 % (ref 11.6–15.4)
WBC: 10.5 10*3/uL (ref 3.4–10.8)

## 2022-11-23 LAB — COMPREHENSIVE METABOLIC PANEL
ALT: 24 IU/L (ref 0–44)
AST: 20 IU/L (ref 0–40)
Albumin: 4.2 g/dL (ref 3.8–4.9)
Alkaline Phosphatase: 91 IU/L (ref 44–121)
BUN/Creatinine Ratio: 18 (ref 9–20)
BUN: 24 mg/dL (ref 6–24)
Bilirubin Total: 0.3 mg/dL (ref 0.0–1.2)
CO2: 24 mmol/L (ref 20–29)
Calcium: 9.5 mg/dL (ref 8.7–10.2)
Chloride: 103 mmol/L (ref 96–106)
Creatinine, Ser: 1.35 mg/dL — ABNORMAL HIGH (ref 0.76–1.27)
Globulin, Total: 2.5 g/dL (ref 1.5–4.5)
Glucose: 98 mg/dL (ref 70–99)
Potassium: 4.9 mmol/L (ref 3.5–5.2)
Sodium: 141 mmol/L (ref 134–144)
Total Protein: 6.7 g/dL (ref 6.0–8.5)
eGFR: 61 mL/min/{1.73_m2} (ref >59)

## 2022-11-24 ENCOUNTER — Ambulatory Visit: Payer: Medicare HMO | Admitting: Family Medicine

## 2022-11-24 ENCOUNTER — Other Ambulatory Visit: Payer: Self-pay

## 2022-11-24 MED ORDER — ICOSAPENT ETHYL 1 G PO CAPS: 2.0000 g | ORAL_CAPSULE | Freq: Two times a day (BID) | ORAL | 2 refills | Status: AC

## 2022-11-27 ENCOUNTER — Encounter: Payer: Self-pay | Admitting: Family Medicine

## 2022-11-27 DIAGNOSIS — N1832 Chronic kidney disease, stage 3b: Secondary | ICD-10-CM

## 2022-11-27 DIAGNOSIS — Z23 Encounter for immunization: Secondary | ICD-10-CM | POA: Insufficient documentation

## 2022-11-27 HISTORY — DX: Chronic kidney disease, stage 3b: N18.32

## 2022-11-27 NOTE — Assessment & Plan Note (Signed)
Recommend continue to work on eating healthy diet and exercise. erious comorbidities including CAD, HTN, STROKE, Hyperlipidemia.

## 2022-11-27 NOTE — Assessment & Plan Note (Signed)
Greatly improved. Currently returned to stage 2.

## 2022-11-27 NOTE — Assessment & Plan Note (Signed)
The current medical regimen is effective;  continue present plan and medications.  Trelegy 1 puff daily, Albuterol 108 mcg inhaler 2 puff every 6 hours PRN.

## 2022-11-27 NOTE — Assessment & Plan Note (Signed)
The current medical regimen is effective;  continue present plan and medications. Continue valsartan 80 mg daily, metoprolol, crestor, plavix, and repatha.

## 2022-11-27 NOTE — Assessment & Plan Note (Signed)
Lifelong eliquis due to recurrent dvt/PE.

## 2022-11-27 NOTE — Assessment & Plan Note (Signed)
Due to eliquis

## 2022-11-27 NOTE — Assessment & Plan Note (Signed)
Well controlled.  No changes to medicines.  Continue Plavix 75 mg daily, metoprolol 50 mg twice daily, rosuvastatin 40  mg daily at bedtime, Repatha 140 mg every 14 days, Fish Oil 1000 mg twice a day. Continue to work on eating a healthy diet and exercise.  Labs drawn today.

## 2022-12-08 ENCOUNTER — Other Ambulatory Visit: Payer: Self-pay

## 2022-12-08 ENCOUNTER — Other Ambulatory Visit: Payer: Medicare HMO

## 2022-12-08 NOTE — Patient Instructions (Signed)
Visit Information  Thank you for taking time to visit with me today. Please don't hesitate to contact me if I can be of assistance to you before our next scheduled telephone appointment.  Following are the goals we discussed today:   Goals Addressed             This Visit's Progress    COMPLETED: CCM:  Maintain, Monitor and Self-Manage Symptoms of COPD       Current Barriers:  Chronic Disease Management support and education needs related to effective management of COPD  Planned Interventions: Provided patient with basic written and verbal COPD education on self care/management/and exacerbation prevention. The patient denies any issues with his COPD at this time. Is compliant with his plan of care.Denies any issues with his COPD. Reminder given to stay hydrated and monitor for triggers that impact his breathing. Education and support given. Advised patient to track and manage COPD triggers. Knows triggers and factors that cause exacerbation. Denies any acute changes in his COPD. Review of protecting self from high pollen count and also weather changes that cause exacerbation. Provided written and verbal instructions on pursed lip breathing and utilized returned demonstration as teach back Provided instruction about proper use of medications used for management of COPD including inhalers. The patient is compliant with medications. Denies any new concerns with medications Advised patient to self assesses COPD action plan zone and make appointment with provider if in the yellow zone for 48 hours without improvement Advised patient to engage in light exercise as tolerated 3-5 days a week to aid in the the management of COPD Provided education about and advised patient to utilize infection prevention strategies to reduce risk of respiratory infection. Education and support provided.  Discussed the importance of adequate rest and management of fatigue with COPD Screening for signs and symptoms of  depression related to chronic disease state  Assessed social determinant of health barriers  Symptom Management: Take medications as prescribed   Attend all scheduled provider appointments Call provider office for new concerns or questions  call the Suicide and Crisis Lifeline: 988 call the [place] National Suicide Prevention Lifeline: 8170736745 or TTY: 502-791-1086 TTY 416-348-4447) to talk to a trained counselor call 1-800-273-TALK (toll free, 24 hour hotline) if experiencing a Mental Health or Behavioral Health Crisis  avoid second hand smoke eliminate smoking in my home identify and remove indoor air pollutants limit outdoor activity during cold weather listen for public air quality announcements every day do breathing exercises every day develop a rescue plan eliminate symptom triggers at home follow rescue plan if symptoms flare-up  Follow Up Plan: Telephone follow up appointment with care management team member scheduled for: 12-08-2022 at 230 pm       RNCM Care Management  Expected Outcome:  Monitor, Self-Manage and Reduce Symptoms of:HLD       Current Barriers:  Care Coordination needs related to medication needs and ongoing support and education from the pharm D for management of medications in a patient with HLD Chronic Disease Management support and education needs related to effective management of HLD   Lab Results  Component Value Date   CHOL 78 (L) 11/22/2022   HDL 23 (L) 11/22/2022   LDLCALC 12 11/22/2022   TRIG 297 (H) 11/22/2022   CHOLHDL 3.4 11/22/2022    Planned Interventions: Provider established cholesterol goals reviewed, Review of labs, education provided. The patient is compliant with the plan of care. The patient states that he saw the pcp this month  and has had changes in his medications. The patient has had changes in his medications since his last pcp visit. He is hoping this help Counseled on importance of regular laboratory monitoring as  prescribed. The patient has labs on a regular basis. Labs are up to date; Provided HLD educational materials. Review of heart health and the importance of making sure he takes his medications and eating healthy.  Reviewed role and benefits of statin for ASCVD risk reduction; Discussed strategies to manage statin-induced myalgias. Working with the pharm D on assistance with Repatha. Takes Rosuvastatin 40 mg QD, Rapatha 140 mg every 14 days, and recent discontinuation of fish oil and Started Vascepa 2 gram BID. Is compliant with medications. Works with the pharm D on a regular basis.  ; Reviewed importance of limiting foods high in cholesterol. Education provided on a heart healthy diet. The patient only drinks coffee per his wife. Instructed on the need to drink water and stay hydrated for kidney health and overall health and well being.; Reviewed exercise goals and target of 150 minutes per week; Screening for signs and symptoms of depression related to chronic disease state;  Assessed social determinant of health barriers;  The patient states he is feeling more tired than usual. Discussed staying  hydrated, drinking more water and trying some protein shakes, and eating more fruits and vegetables. Education and support given.   Symptom Management: Take medications as prescribed   Attend all scheduled provider appointments Call provider office for new concerns or questions  call the Suicide and Crisis Lifeline: 988 call the [place] National Suicide Prevention Lifeline: 209-141-1423 or TTY: 717-708-1035 TTY 9383772726) to talk to a trained counselor call 1-800-273-TALK (toll free, 24 hour hotline) if experiencing a Mental Health or Behavioral Health Crisis  - call for medicine refill 2 or 3 days before it runs out - take all medications exactly as prescribed - call doctor with any symptoms you believe are related to your medicine - call doctor when you experience any new symptoms - go to all  doctor appointments as scheduled - adhere to prescribed diet: heart healthy  Follow Up Plan: Telephone follow up appointment with care management team member scheduled for: 02-16-2023 at 230 pm       RNCM Care Management Expected Outcome:  Monitor, Self-Manage, and Reduce Symptoms of Hypertension       Current Barriers:  Chronic Disease Management support and education needs related to effective management of HTN  BP Readings from Last 3 Encounters:  11/22/22 127/78  09/27/22 122/80  09/08/22 130/80    Planned Interventions: Evaluation of current treatment plan related to hypertension self management and patient's adherence to plan as established by provider. The patient blood pressures are more stable at this time. The patient has his medications and his blood pressures are consistently in the 130/80. He saw the pcp recently and he is doing well. He denies any new issues with his HTN or heart health. Provided education to patient re: stroke prevention, s/s of heart attack and stroke. Review of the patient being at increase risk of heart attack and stroke due to elevated blood pressures. Review of monitoring for chest pain, arm pain, changes in the way her feels, headaches and to seek emergent help for sx and sx of pending heart attack or stroke. Will provide information in my chart for review and education. Reviewed prescribed diet heart healthy diet.The patient is compliant with heart healthy diet.  Reviewed medications with patient and discussed importance of  compliance. The patient is compliant with medications, works with pharm D.  Discussed plans with patient for ongoing care management follow up and provided patient with direct contact information for care management team; Advised patient, providing education and rationale, to monitor blood pressure daily and record, calling PCP for findings outside established parameters;  Reviewed scheduled/upcoming provider appointments including:  11-24-2022 at 940 am. Has sent a message to the pcp and admin staff asking for help with getting a sooner appointment due to elevated blood pressures.  Advised patient to discuss changes in heart health, questions or concerns  with provider; Provided education on prescribed diet heart healthy diet. Education on staying hydrated and monitoring foods high in potassium;  Discussed complications of poorly controlled blood pressure such as heart disease, stroke, circulatory complications, vision complications, kidney impairment, sexual dysfunction;  Screening for signs and symptoms of depression related to chronic disease state;  Assessed social determinant of health barriers;   Symptom Management: Take medications as prescribed   Attend all scheduled provider appointments Call provider office for new concerns or questions  call the Suicide and Crisis Lifeline: 988 call the [place] National Suicide Prevention Lifeline: 913-014-8912 or TTY: 907-574-1610 TTY 636 563 6964) to talk to a trained counselor call 1-800-273-TALK (toll free, 24 hour hotline) if experiencing a Mental Health or Behavioral Health Crisis  check blood pressure weekly learn about high blood pressure call doctor for signs and symptoms of high blood pressure keep all doctor appointments take medications for blood pressure exactly as prescribed report new symptoms to your doctor  Follow Up Plan: Telephone follow up appointment with care management team member scheduled for: 02-16-2023 at 230 pm           Our next appointment is by telephone on 02-16-2023 at 230 pm  Please call the care guide team at [phone] if you need to cancel or reschedule your appointment.   If you are experiencing a Mental Health or Behavioral Health Crisis or need someone to talk to, please call the Suicide and Crisis Lifeline: 988 call the [place] National Suicide Prevention Lifeline: 801-644-2149 or TTY: 4072086178 TTY (620)704-2232) to talk to a  trained counselor call 1-800-273-TALK (toll free, 24 hour hotline) go to [facility] [address] [place] [phone])   Patient verbalizes understanding of instructions and care plan provided today and agrees to view in MyChart. Active MyChart status and patient understanding of how to access instructions and care plan via MyChart confirmed with patient.     Telephone follow up appointment with care management team member scheduled for: 02-16-2023 at 230 pm  Alto Denver RN, MSN, CCM RN Care Manager  [facility]  Ambulatory Care Management  Direct Number: [phone]

## 2022-12-08 NOTE — Patient Outreach (Signed)
Care Management   Visit Note  12/08/2022 Name: Scott Foley MRN: [id number] DOB: 08/17/1965  Subjective: Scott Foley is a 57 y.o. year old male who is a primary care patient of [name], [name], MD. The Care Management team was consulted for assistance.      Engaged with patient spoke with patient by telephone.    Goals Addressed             This Visit's Progress    COMPLETED: CCM:  Maintain, Monitor and Self-Manage Symptoms of COPD       Current Barriers:  Chronic Disease Management support and education needs related to effective management of COPD  Planned Interventions: Provided patient with basic written and verbal COPD education on self care/management/and exacerbation prevention. The patient denies any issues with his COPD at this time. Is compliant with his plan of care.Denies any issues with his COPD. Reminder given to stay hydrated and monitor for triggers that impact his breathing. Education and support given. Advised patient to track and manage COPD triggers. Knows triggers and factors that cause exacerbation. Denies any acute changes in his COPD. Review of protecting self from high pollen count and also weather changes that cause exacerbation. Provided written and verbal instructions on pursed lip breathing and utilized returned demonstration as teach back Provided instruction about proper use of medications used for management of COPD including inhalers. The patient is compliant with medications. Denies any new concerns with medications Advised patient to self assesses COPD action plan zone and make appointment with provider if in the yellow zone for 48 hours without improvement Advised patient to engage in light exercise as tolerated 3-5 days a week to aid in the the management of COPD Provided education about and advised patient to utilize infection prevention strategies to reduce risk of respiratory infection. Education and support provided.  Discussed  the importance of adequate rest and management of fatigue with COPD Screening for signs and symptoms of depression related to chronic disease state  Assessed social determinant of health barriers  Symptom Management: Take medications as prescribed   Attend all scheduled provider appointments Call provider office for new concerns or questions  call the Suicide and Crisis Lifeline: 988 call the [place] National Suicide Prevention Lifeline: (717)064-4440 or TTY: (531)318-3623 TTY (332) 030-1392) to talk to a trained counselor call 1-800-273-TALK (toll free, 24 hour hotline) if experiencing a Mental Health or Behavioral Health Crisis  avoid second hand smoke eliminate smoking in my home identify and remove indoor air pollutants limit outdoor activity during cold weather listen for public air quality announcements every day do breathing exercises every day develop a rescue plan eliminate symptom triggers at home follow rescue plan if symptoms flare-up  Follow Up Plan: Telephone follow up appointment with care management team member scheduled for: 12-08-2022 at 230 pm       RNCM Care Management  Expected Outcome:  Monitor, Self-Manage and Reduce Symptoms of:HLD       Current Barriers:  Care Coordination needs related to medication needs and ongoing support and education from the pharm D for management of medications in a patient with HLD Chronic Disease Management support and education needs related to effective management of HLD   Lab Results  Component Value Date   CHOL 78 (L) 11/22/2022   HDL 23 (L) 11/22/2022   LDLCALC 12 11/22/2022   TRIG 297 (H) 11/22/2022   CHOLHDL 3.4 11/22/2022    Planned Interventions: Provider established cholesterol goals reviewed, Review of labs, education provided.  The patient is compliant with the plan of care. The patient states that he saw the pcp this month and has had changes in his medications. The patient has had changes in his medications since his  last pcp visit. He is hoping this help Counseled on importance of regular laboratory monitoring as prescribed. The patient has labs on a regular basis. Labs are up to date; Provided HLD educational materials. Review of heart health and the importance of making sure he takes his medications and eating healthy.  Reviewed role and benefits of statin for ASCVD risk reduction; Discussed strategies to manage statin-induced myalgias. Working with the pharm D on assistance with Repatha. Takes Rosuvastatin 40 mg QD, Rapatha 140 mg every 14 days, and recent discontinuation of fish oil and Started Vascepa 2 gram BID. Is compliant with medications. Works with the pharm D on a regular basis.  ; Reviewed importance of limiting foods high in cholesterol. Education provided on a heart healthy diet. The patient only drinks coffee per his wife. Instructed on the need to drink water and stay hydrated for kidney health and overall health and well being.; Reviewed exercise goals and target of 150 minutes per week; Screening for signs and symptoms of depression related to chronic disease state;  Assessed social determinant of health barriers;  The patient states he is feeling more tired than usual. Discussed staying  hydrated, drinking more water and trying some protein shakes, and eating more fruits and vegetables. Education and support given.   Symptom Management: Take medications as prescribed   Attend all scheduled provider appointments Call provider office for new concerns or questions  call the Suicide and Crisis Lifeline: 988 call the [place] National Suicide Prevention Lifeline: (604) 763-3645 or TTY: 361 544 5466 TTY 2395813916) to talk to a trained counselor call 1-800-273-TALK (toll free, 24 hour hotline) if experiencing a Mental Health or Behavioral Health Crisis  - call for medicine refill 2 or 3 days before it runs out - take all medications exactly as prescribed - call doctor with any symptoms you  believe are related to your medicine - call doctor when you experience any new symptoms - go to all doctor appointments as scheduled - adhere to prescribed diet: heart healthy  Follow Up Plan: Telephone follow up appointment with care management team member scheduled for: 02-16-2023 at 230 pm       RNCM Care Management Expected Outcome:  Monitor, Self-Manage, and Reduce Symptoms of Hypertension       Current Barriers:  Chronic Disease Management support and education needs related to effective management of HTN  BP Readings from Last 3 Encounters:  11/22/22 127/78  09/27/22 122/80  09/08/22 130/80    Planned Interventions: Evaluation of current treatment plan related to hypertension self management and patient's adherence to plan as established by provider. The patient blood pressures are more stable at this time. The patient has his medications and his blood pressures are consistently in the 130/80. He saw the pcp recently and he is doing well. He denies any new issues with his HTN or heart health. Provided education to patient re: stroke prevention, s/s of heart attack and stroke. Review of the patient being at increase risk of heart attack and stroke due to elevated blood pressures. Review of monitoring for chest pain, arm pain, changes in the way her feels, headaches and to seek emergent help for sx and sx of pending heart attack or stroke. Will provide information in my chart for review and education. Reviewed prescribed diet  heart healthy diet.The patient is compliant with heart healthy diet.  Reviewed medications with patient and discussed importance of compliance. The patient is compliant with medications, works with pharm D.  Discussed plans with patient for ongoing care management follow up and provided patient with direct contact information for care management team; Advised patient, providing education and rationale, to monitor blood pressure daily and record, calling PCP for  findings outside established parameters;  Reviewed scheduled/upcoming provider appointments including: 11-24-2022 at 940 am. Has sent a message to the pcp and admin staff asking for help with getting a sooner appointment due to elevated blood pressures.  Advised patient to discuss changes in heart health, questions or concerns  with provider; Provided education on prescribed diet heart healthy diet. Education on staying hydrated and monitoring foods high in potassium;  Discussed complications of poorly controlled blood pressure such as heart disease, stroke, circulatory complications, vision complications, kidney impairment, sexual dysfunction;  Screening for signs and symptoms of depression related to chronic disease state;  Assessed social determinant of health barriers;   Symptom Management: Take medications as prescribed   Attend all scheduled provider appointments Call provider office for new concerns or questions  call the Suicide and Crisis Lifeline: 988 call the [place] National Suicide Prevention Lifeline: (507)056-7685 or TTY: (479)605-0614 TTY 647-701-1215) to talk to a trained counselor call 1-800-273-TALK (toll free, 24 hour hotline) if experiencing a Mental Health or Behavioral Health Crisis  check blood pressure weekly learn about high blood pressure call doctor for signs and symptoms of high blood pressure keep all doctor appointments take medications for blood pressure exactly as prescribed report new symptoms to your doctor  Follow Up Plan: Telephone follow up appointment with care management team member scheduled for: 02-16-2023 at 230 pm             Consent to Services:  Patient was given information about care management services, agreed to services, and gave verbal consent to participate.   Plan: Telephone follow up appointment with care management team member scheduled for: 02-16-2023 at 230 pm  Alto Denver RN, MSN, CCM RN Care Manager  [facility]  Ambulatory Care  Management  Direct Number: [phone]

## 2022-12-18 ENCOUNTER — Other Ambulatory Visit: Payer: Self-pay

## 2022-12-18 ENCOUNTER — Other Ambulatory Visit: Payer: Self-pay | Admitting: Family Medicine

## 2022-12-18 ENCOUNTER — Telehealth: Payer: Self-pay

## 2022-12-18 NOTE — Telephone Encounter (Signed)
Dawn called to report that Scott Foley is having swelling and itching of his hands.  His symptoms seemed to be worse when walking at the [facility] over the weekend.  Dr. Sedalia Muta is concerned that he has a possible reaction to medication.  He was instructed to stop the Vascepa and see if his symptoms resolve.

## 2022-12-19 DIAGNOSIS — G4733 Obstructive sleep apnea (adult) (pediatric): Secondary | ICD-10-CM | POA: Diagnosis not present

## 2023-01-17 DIAGNOSIS — G4733 Obstructive sleep apnea (adult) (pediatric): Secondary | ICD-10-CM | POA: Diagnosis not present

## 2023-01-18 ENCOUNTER — Other Ambulatory Visit: Payer: Self-pay

## 2023-01-19 DIAGNOSIS — G4733 Obstructive sleep apnea (adult) (pediatric): Secondary | ICD-10-CM | POA: Diagnosis not present

## 2023-02-16 ENCOUNTER — Other Ambulatory Visit: Payer: Self-pay

## 2023-02-16 ENCOUNTER — Other Ambulatory Visit: Payer: Self-pay | Admitting: *Deleted

## 2023-02-16 ENCOUNTER — Other Ambulatory Visit: Payer: Self-pay | Admitting: Family Medicine

## 2023-02-16 DIAGNOSIS — J449 Chronic obstructive pulmonary disease, unspecified: Secondary | ICD-10-CM

## 2023-02-16 DIAGNOSIS — Z9981 Dependence on supplemental oxygen: Secondary | ICD-10-CM

## 2023-02-16 DIAGNOSIS — J9611 Chronic respiratory failure with hypoxia: Secondary | ICD-10-CM

## 2023-02-16 NOTE — Patient Outreach (Signed)
Care Management   Visit Note  02/16/2023 Name: Scott Foley MRN: [id number] DOB: 1965/10/17  Subjective: Scott Foley is a 57 y.o. year old male who is a primary care patient of [name], [name], MD. The Care Management team was consulted for assistance.      Engaged with patient spoke with patient by telephone.   Goals Addressed             This Visit's Progress    RNCM Care Management  Expected Outcome:  Monitor, Self-Manage and Reduce Symptoms of:HLD       Current Barriers:  Care Coordination needs related to medication needs and ongoing support and education from the pharm D for management of medications in a patient with HLD Chronic Disease Management support and education needs related to effective management of HLD   Lab Results  Component Value Date   CHOL 78 (L) 11/22/2022   HDL 23 (L) 11/22/2022   LDLCALC 12 11/22/2022   TRIG 297 (H) 11/22/2022   CHOLHDL 3.4 11/22/2022    Planned Interventions: Provider established cholesterol goals reviewed, Review of labs, education provided. The patient is compliant with the plan of care. Reports no issues with HLD. Counseled on importance of regular laboratory monitoring as prescribed. The patient has labs on a regular basis. Labs are up to date; Provided HLD educational materials. Review of heart health and the importance of making sure he takes his medications and eating healthy.  Reviewed role and benefits of statin for ASCVD risk reduction; Rosuvastatin 40 mg daily at bedtime, Repatha 140 mg every 14 days, Fish Oil 1000 mg twice a day.  Discussed strategies to manage statin-induced myalgias. Denies myalgias. Reviewed importance of limiting foods high in cholesterol. Education provided on a heart healthy diet. The patient only drinks coffee per his wife. Instructed on the need to drink water and stay hydrated for kidney health and overall health and well being.; Reviewed exercise goals and target of 150 minutes per  week; Screening for signs and symptoms of depression related to chronic disease state;  Assessed social determinant of health barriers;    Symptom Management: Take medications as prescribed   Attend all scheduled provider appointments Call provider office for new concerns or questions  call the Suicide and Crisis Lifeline: 988 call the [place] National Suicide Prevention Lifeline: 4063929869 or TTY: (503) 690-9523 TTY (720) 658-3615) to talk to a trained counselor call 1-800-273-TALK (toll free, 24 hour hotline) if experiencing a Mental Health or Behavioral Health Crisis  - call for medicine refill 2 or 3 days before it runs out - take all medications exactly as prescribed - call doctor with any symptoms you believe are related to your medicine - call doctor when you experience any new symptoms - go to all doctor appointments as scheduled - adhere to prescribed diet: heart healthy  Follow Up Plan: Telephone follow up appointment with care management team member scheduled for: 03-30-2023 at 1:00 pm       RNCM Care Management Expected Outcome:  Monitor, Self-Manage, and Reduce Symptoms of Hypertension       Current Barriers:  Chronic Disease Management support and education needs related to effective management of HTN  BP Readings from Last 3 Encounters:  11/22/22 127/78  09/27/22 122/80  09/08/22 130/80    Planned Interventions: Evaluation of current treatment plan related to hypertension self management and patient's adherence to plan as established by provider. Patient reports his blood pressure was good and stated it was 170/80. RNCM advised that  this blood pressure was elevated and not ideal. He states that he is compliant with valsartan 80 mg daily, metoprolol 50 mg twice a day.  He states that when he saw Dr. Allena Katz he made no changes and plans to follow up with him in January. He states his blood pressure fluctuates frequently throughout the day. He denies any dizziness or headaches  at this time and states that he is fine. Provided education to patient re: stroke prevention, s/s of heart attack and stroke. Review of the patient being at increase risk of heart attack and stroke due to elevated blood pressures.  Review of monitoring for chest pain, arm pain, changes in the way her feels, headaches and to seek emergent help for sx and sx of pending heart attack or stroke.  Reviewed prescribed diet heart healthy diet.The patient is compliant with heart healthy diet.  Reviewed medications with patient and discussed importance of compliance. The patient is compliant with medications, works with pharm D.  Discussed plans with patient for ongoing care management follow up and provided patient with direct contact information for care management team; Advised patient, providing education and rationale, to monitor blood pressure daily and record, calling PCP for findings outside established parameters; RNCM advised to keep record of blood pressures to provide during outreach and to provider during his office visits to help the provider best treat his hypertension. Reviewed scheduled/upcoming provider appointments including: 02-27-2023 with PCP Advised patient to discuss changes in heart health, questions or concerns  with provider; Provided education on prescribed diet heart healthy diet. Education on staying hydrated and monitoring foods high in potassium;  Discussed complications of poorly controlled blood pressure such as heart disease, stroke, circulatory complications, vision complications, kidney impairment, sexual dysfunction;  Screening for signs and symptoms of depression related to chronic disease state;  Assessed social determinant of health barriers;   Symptom Management: Take medications as prescribed   Attend all scheduled provider appointments Call provider office for new concerns or questions  call the Suicide and Crisis Lifeline: 988 call the [place] National Suicide  Prevention Lifeline: (575) 590-5239 or TTY: 250-577-4042 TTY (832) 149-6025) to talk to a trained counselor call 1-800-273-TALK (toll free, 24 hour hotline) if experiencing a Mental Health or Behavioral Health Crisis  check blood pressure weekly learn about high blood pressure call doctor for signs and symptoms of high blood pressure keep all doctor appointments take medications for blood pressure exactly as prescribed report new symptoms to your doctor  Follow Up Plan: Telephone follow up appointment with care management team member scheduled for: 03-30-2023 at 1:00 pm          Consent to Services:  Patient was given information about care management services, agreed to services, and gave verbal consent to participate.   Plan: Telephone follow up appointment with care management team member scheduled for:03-30-2023 at 1:00 pm  Danise Edge, BSN RN RN Care Manager  [facility]  Ambulatory Care Management  Direct Number: [phone]

## 2023-02-16 NOTE — Patient Instructions (Signed)
Visit Information  Thank you for taking time to visit with me today. Please don't hesitate to contact me if I can be of assistance to you before our next scheduled telephone appointment.  Following are the goals we discussed today:   Goals Addressed             This Visit's Progress    RNCM Care Management  Expected Outcome:  Monitor, Self-Manage and Reduce Symptoms of:HLD       Current Barriers:  Care Coordination needs related to medication needs and ongoing support and education from the pharm D for management of medications in a patient with HLD Chronic Disease Management support and education needs related to effective management of HLD   Lab Results  Component Value Date   CHOL 78 (L) 11/22/2022   HDL 23 (L) 11/22/2022   LDLCALC 12 11/22/2022   TRIG 297 (H) 11/22/2022   CHOLHDL 3.4 11/22/2022    Planned Interventions: Provider established cholesterol goals reviewed, Review of labs, education provided. The patient is compliant with the plan of care. Reports no issues with HLD. Counseled on importance of regular laboratory monitoring as prescribed. The patient has labs on a regular basis. Labs are up to date; Provided HLD educational materials. Review of heart health and the importance of making sure he takes his medications and eating healthy.  Reviewed role and benefits of statin for ASCVD risk reduction; Rosuvastatin 40 mg daily at bedtime, Repatha 140 mg every 14 days, Fish Oil 1000 mg twice a day.  Discussed strategies to manage statin-induced myalgias. Denies myalgias. Reviewed importance of limiting foods high in cholesterol. Education provided on a heart healthy diet. The patient only drinks coffee per his wife. Instructed on the need to drink water and stay hydrated for kidney health and overall health and well being.; Reviewed exercise goals and target of 150 minutes per week; Screening for signs and symptoms of depression related to chronic disease state;  Assessed  social determinant of health barriers;    Symptom Management: Take medications as prescribed   Attend all scheduled provider appointments Call provider office for new concerns or questions  call the Suicide and Crisis Lifeline: 988 call the [place] National Suicide Prevention Lifeline: (563) 050-2154 or TTY: 3128188234 TTY 4757548772) to talk to a trained counselor call 1-800-273-TALK (toll free, 24 hour hotline) if experiencing a Mental Health or Behavioral Health Crisis  - call for medicine refill 2 or 3 days before it runs out - take all medications exactly as prescribed - call doctor with any symptoms you believe are related to your medicine - call doctor when you experience any new symptoms - go to all doctor appointments as scheduled - adhere to prescribed diet: heart healthy  Follow Up Plan: Telephone follow up appointment with care management team member scheduled for: 03-30-2023 at 1:00 pm       RNCM Care Management Expected Outcome:  Monitor, Self-Manage, and Reduce Symptoms of Hypertension       Current Barriers:  Chronic Disease Management support and education needs related to effective management of HTN  BP Readings from Last 3 Encounters:  11/22/22 127/78  09/27/22 122/80  09/08/22 130/80    Planned Interventions: Evaluation of current treatment plan related to hypertension self management and patient's adherence to plan as established by provider. Patient reports his blood pressure was good and stated it was 170/80. RNCM advised that this blood pressure was elevated and not ideal. He states that he is compliant with valsartan 80 mg  daily, metoprolol 50 mg twice a day.  He states that when he saw Dr. Allena Katz he made no changes and plans to follow up with him in January. He states his blood pressure fluctuates frequently throughout the day. He denies any dizziness or headaches at this time and states that he is fine. Provided education to patient re: stroke prevention, s/s  of heart attack and stroke. Review of the patient being at increase risk of heart attack and stroke due to elevated blood pressures.  Review of monitoring for chest pain, arm pain, changes in the way her feels, headaches and to seek emergent help for sx and sx of pending heart attack or stroke.  Reviewed prescribed diet heart healthy diet.The patient is compliant with heart healthy diet.  Reviewed medications with patient and discussed importance of compliance. The patient is compliant with medications, works with pharm D.  Discussed plans with patient for ongoing care management follow up and provided patient with direct contact information for care management team; Advised patient, providing education and rationale, to monitor blood pressure daily and record, calling PCP for findings outside established parameters; RNCM advised to keep record of blood pressures to provide during outreach and to provider during his office visits to help the provider best treat his hypertension. Reviewed scheduled/upcoming provider appointments including: 02-27-2023 with PCP Advised patient to discuss changes in heart health, questions or concerns  with provider; Provided education on prescribed diet heart healthy diet. Education on staying hydrated and monitoring foods high in potassium;  Discussed complications of poorly controlled blood pressure such as heart disease, stroke, circulatory complications, vision complications, kidney impairment, sexual dysfunction;  Screening for signs and symptoms of depression related to chronic disease state;  Assessed social determinant of health barriers;   Symptom Management: Take medications as prescribed   Attend all scheduled provider appointments Call provider office for new concerns or questions  call the Suicide and Crisis Lifeline: 988 call the [place] National Suicide Prevention Lifeline: 279-774-5014 or TTY: 775-019-4770 TTY 480-690-2396) to talk to a trained  counselor call 1-800-273-TALK (toll free, 24 hour hotline) if experiencing a Mental Health or Behavioral Health Crisis  check blood pressure weekly learn about high blood pressure call doctor for signs and symptoms of high blood pressure keep all doctor appointments take medications for blood pressure exactly as prescribed report new symptoms to your doctor  Follow Up Plan: Telephone follow up appointment with care management team member scheduled for: 03-30-2023 at 1:00 pm           Our next appointment is by telephone on 03-30-2023 at 1:00 pm  Please call the care guide team at [phone] if you need to cancel or reschedule your appointment.   If you are experiencing a Mental Health or Behavioral Health Crisis or need someone to talk to, please call the Suicide and Crisis Lifeline: 988 call the [place] National Suicide Prevention Lifeline: (203) 224-3436 or TTY: (581)197-9253 TTY 512-702-9857) to talk to a trained counselor call 1-800-273-TALK (toll free, 24 hour hotline) call 911   Patient verbalizes understanding of instructions and care plan provided today and agrees to view in MyChart. Active MyChart status and patient understanding of how to access instructions and care plan via MyChart confirmed with patient.     Telephone follow up appointment with care management team member scheduled for:03-30-2023 at 1:00 pm  Danise Edge, BSN RN RN Care Manager  [facility]  Ambulatory Care Management  Direct Number: [phone]

## 2023-02-18 DIAGNOSIS — G4733 Obstructive sleep apnea (adult) (pediatric): Secondary | ICD-10-CM | POA: Diagnosis not present

## 2023-02-26 NOTE — Progress Notes (Signed)
Subjective:  Patient ID: Scott Foley, male    DOB: 06/24/1965  Age: 57 y.o. MRN: [id number]  Chief Complaint  Patient presents with   Medical Management of Chronic Issues    HPI   Hypertension with stage 3B CKD: Patient is taking valsartan 80 mg daily, Metoprolol 50 mg twice a day. Saw Dr. Allena Katz.    CAD: He is on Plavix 75 mg daily, metoprolol 50 mg twice daily, rosuvastatin 40  mg daily at bedtime, and repatha  History of PE/DVT. Numerous. On eliquis 5 mg twice daily.   Hyperlipidemia: Rosuvastatin 40 mg daily at bedtime, Repatha 140 mg every 14 days, Fish Oil 1000 mg twice a day.  COPD:  Trelegy 1 puff daily, Albuterol 108 mcg inhaler 2 puff every 6 hours PRN.  GERD: Pantoprazole 40 mg twice daily.   Patient is eating healthy and some exercising.   OSA: on CPAP. Can tell an improvement in sleep and energy. Is compliant with cpap.   B12 deficiency : on B12 1000 mcg once daily      09/27/2022    2:09 PM 08/04/2022    9:10 AM 02/24/2022    2:08 PM 09/12/2021    2:02 PM 06/22/2021    3:16 PM  Depression screen PHQ 2/9  Decreased Interest 0 0 0 0 0  Down, Depressed, Hopeless 0 0 0 0 0  PHQ - 2 Score 0 0 0 0 0  Altered sleeping 0 0     Tired, decreased energy 2 2     Change in appetite 0 0     Feeling bad or failure about yourself  0 0     Trouble concentrating 0 0     Moving slowly or fidgety/restless 0 0     Suicidal thoughts 0 0     PHQ-9 Score 2 2     Difficult doing work/chores Not difficult at all Not difficult at all           09/25/2022    1:46 PM  Fall Risk   Falls in the past year? 0  Number falls in past yr: 0  Injury with Fall? 0  Risk for fall due to : No Fall Risks  Follow up Falls evaluation completed    Patient Care Team: Blane Ohara, MD as PCP - General (Family Medicine) Croitoru, Rachelle Hora, MD as PCP - Cardiology (Cardiology) Dagoberto Ligas, MD as Consulting Physician (Nephrology) Ricky Stabs, RN as [facility] Care Management (General  Practice)   Review of Systems  Constitutional:  Negative for chills, diaphoresis, fatigue and fever.  HENT:  Negative for congestion, ear pain and sore throat.   Respiratory:  Negative for cough and shortness of breath.   Cardiovascular:  Negative for chest pain and leg swelling.  Gastrointestinal:  Negative for abdominal pain, constipation, diarrhea, nausea and vomiting.  Genitourinary:  Negative for dysuria and urgency.  Musculoskeletal:  Negative for arthralgias and myalgias.  Neurological:  Negative for dizziness and headaches.  Psychiatric/Behavioral:  Negative for dysphoric mood.     Current Outpatient Medications on File Prior to Visit  Medication Sig Dispense Refill   albuterol (VENTOLIN HFA) 108 (90 Base) MCG/ACT inhaler INHALE 2 PUFFS BY MOUTH INTO LUNGS EVERY 6 HOURS AS NEEDED FOR SHORTNESS OF BREATH OR WHEEZING 17 g 0   clopidogrel (PLAVIX) 75 MG tablet TAKE ONE TABLET BY MOUTH EVERY MORNING 90 tablet 1   cyanocobalamin (VITAMIN B12) 1000 MCG tablet TAKE ONE TABLET BY MOUTH EVERY MORNING  90 tablet 3   ELIQUIS 5 MG TABS tablet TAKE ONE TABLET BY MOUTH TWICE DAILY 180 tablet 1   Fluticasone-Umeclidin-Vilant (TRELEGY ELLIPTA) 100-62.5-25 MCG/ACT AEPB Inhale 1 puff into the lungs daily. 1 each 11   icosapent Ethyl (VASCEPA) 1 g capsule Take 2 capsules (2 g total) by mouth 2 (two) times daily. 120 capsule 2   metoprolol tartrate (LOPRESSOR) 50 MG tablet TAKE ONE TABLET BY MOUTH TWICE DAILY 180 tablet 1   Multiple Vitamin (MULTIVITAMIN WITH MINERALS) TABS tablet Take 1 tablet by mouth daily.     Omega-3 Fatty Acids (FISH OIL) 1000 MG CAPS Take 1 capsule (1,000 mg total) by mouth 2 (two) times daily. (Patient not taking: Reported on 12/08/2022) 180 capsule 0   ondansetron (ZOFRAN) 4 MG tablet Take 1 tablet (4 mg total) by mouth every 8 (eight) hours as needed for nausea or vomiting. 20 tablet 0   pantoprazole (PROTONIX) 40 MG tablet TAKE ONE TABLET BY MOUTH EVERY MORNING and TAKE ONE  TABLET BY MOUTH EVERY EVENING 180 tablet 1   REPATHA SURECLICK 140 MG/ML SOAJ INJECT 140MG  ( ) INTO THE SKIN EVERY 14 DAYS 2 mL 0   rosuvastatin (CRESTOR) 40 MG tablet TAKE ONE TABLET BY MOUTH EVERY EVENING 90 tablet 1   valsartan (DIOVAN) 80 MG tablet Take 1 tablet (80 mg total) by mouth daily. 90 tablet 3   No current facility-administered medications on file prior to visit.   Past Medical History:  Diagnosis Date   Acquired thrombophilia (HCC) 12/06/2020   B12 deficiency 07/04/2021   Chronic obstructive pulmonary disease (HCC) 07/16/2018   Class 2 severe obesity due to excess calories with serious comorbidity and body mass index (BMI) of 37.0 to 37.9 in adult (HCC) 12/06/2020   BMI 35 with serious comorbidities including CAD, HTN, STROKE, Hyperlipidemia.   Colon polyp 10/25/2020   serrated adenoma.   Coronary artery disease due to lipid rich plaque 01/01/2021   Coronary artery disease with stable angina pectoris [facility])    AMI No heart cath, treated medically [facility]   Daytime somnolence 08/04/2022   Diverticulosis of colon 10/25/2020   Noted on colnoscopy by Dr Tobi Bastos in [place].   DNR (do not resuscitate) 03/03/2019   History of blood clotting disorder 06/11/2021   History of CVA (cerebrovascular accident) 07/16/2018   Formatting of this note might be different from the original. CVA 3 years ago left with short term memory deficits. Has been on Plavix since.   History of pulmonary embolism 02/15/2021   Hyperlipemia    Hypertensive heart disease without congestive heart failure 12/06/2020   Hypoglycemia 04/15/2021   Hypokalemia 04/13/2021   Hypomagnesemia 06/11/2021   Impingement of left shoulder 04/05/2022   Intra-abdominal fluid collection 07/18/2021   Medication monitoring encounter 06/24/2021   Mixed hyperlipidemia 07/22/2018   Morbid obesity (HCC) 12/06/2020   BMI 35 with serious comorbidities including CAD, HTN, STROKE, Hyperlipidemia.   Normocytic anemia 04/15/2021    Other fatigue 12/06/2020   Pericardial effusion 04/29/2021   Protein-calorie malnutrition, severe 04/15/2021   Pulmonary nodules/lesions, multiple 11/25/2012   9/15/2014CT Chest : small nodule RUL LUL. Stable since 03/2012 Smoking cessation advised and Rx chantix Repeat CT Chest 4 /21/15: subpleural lymph nodes, likely benign repeat one year 2016>>>no change, considered benign Cleda Daub 11/25/2012  NORMAL   Sequela, post-stroke 07/28/2019   Sleep apnea    CPAP   VRE (vancomycin-resistant Enterococci) infection 05/11/2021   Past Surgical History:  Procedure Laterality Date   COLONOSCOPY WITH PROPOFOL  N/A 10/25/2020   Wyline Mood, MD, at [facility]. 25 MM serrated adenoma polyp at ascending colon removed and site clipped.  Pan diverticulosis.   IR CATHETER TUBE CHANGE  09/22/2021   IR CATHETER TUBE CHANGE  10/10/2021   IR RADIOLOGIST EVAL & MGMT  05/25/2021   IR RADIOLOGIST EVAL & MGMT  08/09/2021   IR RADIOLOGIST EVAL & MGMT  08/23/2021   IR RADIOLOGIST EVAL & MGMT  09/21/2021   IR RADIOLOGIST EVAL & MGMT  10/07/2021   LAPAROSCOPIC CHOLECYSTECTOMY  02/2021   PERICARDIOCENTESIS N/A 05/01/2021   Procedure: PERICARDIOCENTESIS;  Surgeon: [place], Peter M, MD;  Location: [facility] INVASIVE CV LAB;  Service: Cardiovascular;  Laterality: N/A;   TONSILLECTOMY      Family History  Problem Relation Age of Onset   Pulmonary embolism Mother    Hypertension Father    Heart attack Father    Stroke Father    Prostate cancer Father    Lung disease Father    Cancer Sister 68       lung cancer   Hypertension Sister    Cancer Sister 7       lung cancer   Lung cancer Sister    Cancer Brother        lung   Hypertension Brother    Heart disease Brother    Cancer Brother 62       throat and lung cancer   Heart attack Brother    Cancer - Lung Brother    Brain cancer Brother    Social History   Socioeconomic History   Marital status: Married    Spouse name: Dawn   Number of children: 2   Years of education:  Not on file   Highest education level: GED or equivalent  Occupational History   Not on file  Tobacco Use   Smoking status: Former    Current packs/day: 0.00    Average packs/day: 0.8 packs/day for 41.7 years (31.2 ttl pk-yrs)    Types: Cigarettes    Start date: 03/14/1979    Quit date: 11/2020    Years since quitting: 2.3   Smokeless tobacco: Never  Vaping Use   Vaping status: Former   Start date: 07/28/2018  Substance and Sexual Activity   Alcohol use: No   Drug use: No   Sexual activity: Yes    Partners: Female  Other Topics Concern   Not on file  Social History Narrative   Not on file   Social Drivers of Health   Financial Resource Strain: Low Risk  (09/25/2022)   Overall Financial Resource Strain (CARDIA)    Difficulty of Paying Living Expenses: Not hard at all  Recent Concern: Financial Resource Strain - Medium Risk (06/29/2022)   Overall Financial Resource Strain (CARDIA)    Difficulty of Paying Living Expenses: Somewhat hard  Food Insecurity: No Food Insecurity (09/25/2022)   Hunger Vital Sign    Worried About Running Out of Food in the Last Year: Never true    Ran Out of Food in the Last Year: Never true  Transportation Needs: No Transportation Needs (09/25/2022)   PRAPARE - Administrator, Civil Service (Medical): No    Lack of Transportation (Non-Medical): No  Physical Activity: Insufficiently Active (09/25/2022)   Exercise Vital Sign    Days of Exercise per Week: 1 day    Minutes of Exercise per Session: 30 min  Stress: No Stress Concern Present (09/25/2022)   [employer] of Occupational Health - Occupational  Stress Questionnaire    Feeling of Stress : Only a little  Social Connections: Moderately Isolated (09/25/2022)   Social Connection and Isolation Panel [NHANES]    Frequency of Communication with Friends and Family: More than three times a week    Frequency of Social Gatherings with Friends and Family: Once a week    Attends Religious  Services: Never    Diplomatic Services operational officer: No    Attends Engineer, structural: Never    Marital Status: Married    Objective:  BP 108/70   Pulse 69   Temp (!) 97.1 F (36.2 C)   Ht 5\' 7"  (1.702 m)   Wt 238 lb (108 kg)   SpO2 95%   BMI 37.28 kg/m      02/27/2023    1:59 PM 11/22/2022    2:59 PM 09/27/2022    1:38 PM  BP/Weight  Systolic BP 108 127 122  Diastolic BP 70 78 80  Wt. (Lbs) 238 231.6 221  BMI 37.28 kg/m2 39.75 kg/m2 37.93 kg/m2    Physical Exam Vitals reviewed.  Constitutional:      Appearance: Normal appearance. He is obese.  Cardiovascular:     Rate and Rhythm: Normal rate and regular rhythm.     Heart sounds: Normal heart sounds. No murmur heard. Pulmonary:     Effort: Pulmonary effort is normal.     Breath sounds: Normal breath sounds.  Abdominal:     General: Abdomen is flat. Bowel sounds are normal.     Palpations: Abdomen is soft.     Tenderness: There is no abdominal tenderness.  Neurological:     Mental Status: He is alert and oriented to person, place, and time.  Psychiatric:        Mood and Affect: Mood normal.        Behavior: Behavior normal.     Diabetic Foot Exam - Simple   No data filed      Lab Results  Component Value Date   WBC 9.6 02/27/2023   HGB 15.2 02/27/2023   HCT 46.0 02/27/2023   PLT 305 02/27/2023   GLUCOSE 75 02/27/2023   CHOL 54 (L) 02/27/2023   TRIG 117 02/27/2023   HDL 31 (L) 02/27/2023   LDLCALC 2 02/27/2023   ALT 23 02/27/2023   AST 17 02/27/2023   NA 138 02/27/2023   K 5.3 (H) 02/27/2023   CL 102 02/27/2023   CREATININE 1.38 (H) 02/27/2023   BUN 24 02/27/2023   CO2 21 02/27/2023   TSH 3.170 04/05/2022   INR 1.2 07/21/2021      Assessment & Plan:    Hypertensive heart disease without congestive heart failure Assessment & Plan: The current medical regimen is effective;  continue present plan and medications. Continue valsartan 80 mg daily, metoprolol 50 mg twice  daily   Orders: -     CBC with Differential/Platelet -     Comprehensive metabolic panel  Mixed hyperlipidemia Assessment & Plan: The current medical regimen is effective;  continue present plan and medications. Continue repatha, rosuvastatin, fish oil.   Orders: -     Lipid panel  Screening for lung cancer -     CT CHEST LUNG CANCER SCREENING LOW DOSE WO CONTRAST; Future  Coronary artery disease of native artery of native heart with stable angina pectoris [facility]) Assessment & Plan: Stable. Continue Plavix 75 mg daily, metoprolol 50 mg twice daily, rosuvastatin 40 mg daily at bedtime, and repatha  Simple chronic bronchitis (HCC) Assessment & Plan: The current medical regimen is effective;  continue present plan and medications. Continue Trelegy 1 puff daily, Albuterol 108 mcg inhaler 2 puff every 6 hours PRN   Stage 3b chronic kidney disease (CKD) (HCC) Assessment & Plan: Stable.       No orders of the defined types were placed in this encounter.   Orders Placed This Encounter  Procedures   CT CHEST LUNG CANCER SCREENING LOW DOSE WO CONTRAST   CBC with Differential/Platelet   Comprehensive metabolic panel   Lipid panel     Follow-up: Return in about 3 months (around 05/28/2023) for chronic fasting.   I,Marla I Leal-Borjas,acting as a scribe for Blane Ohara, MD.,have documented all relevant documentation on the behalf of Blane Ohara, MD,as directed by  Blane Ohara, MD while in the presence of Blane Ohara, MD.   An After Visit Summary was printed and given to the patient.  I attest that I have reviewed this visit and agree with the plan scribed by my staff.   Blane Ohara, MD [facility] [phone]

## 2023-02-27 ENCOUNTER — Ambulatory Visit: Payer: Medicare PPO | Admitting: Family Medicine

## 2023-02-27 ENCOUNTER — Encounter: Payer: Self-pay | Admitting: Family Medicine

## 2023-02-27 VITALS — BP 108/70 | HR 69 | Temp 97.1°F | Ht 67.0 in | Wt 238.0 lb

## 2023-02-27 DIAGNOSIS — Z122 Encounter for screening for malignant neoplasm of respiratory organs: Secondary | ICD-10-CM | POA: Diagnosis not present

## 2023-02-27 DIAGNOSIS — E782 Mixed hyperlipidemia: Secondary | ICD-10-CM | POA: Diagnosis not present

## 2023-02-27 DIAGNOSIS — I25118 Atherosclerotic heart disease of native coronary artery with other forms of angina pectoris: Secondary | ICD-10-CM

## 2023-02-27 DIAGNOSIS — N1832 Chronic kidney disease, stage 3b: Secondary | ICD-10-CM | POA: Diagnosis not present

## 2023-02-27 DIAGNOSIS — I119 Hypertensive heart disease without heart failure: Secondary | ICD-10-CM | POA: Diagnosis not present

## 2023-02-27 DIAGNOSIS — J410 Simple chronic bronchitis: Secondary | ICD-10-CM

## 2023-02-27 NOTE — Patient Instructions (Signed)
Recommend get tetanus (tdap) at the pharmacy as this is where medicare covers it.   Recommend continue to work on eating a healthier diet and exercise.

## 2023-02-28 LAB — COMPREHENSIVE METABOLIC PANEL
ALT: 23 [IU]/L (ref 0–44)
AST: 17 [IU]/L (ref 0–40)
Albumin: 4.2 g/dL (ref 3.8–4.9)
Alkaline Phosphatase: 95 [IU]/L (ref 44–121)
BUN/Creatinine Ratio: 17 (ref 9–20)
BUN: 24 mg/dL (ref 6–24)
Bilirubin Total: 0.4 mg/dL (ref 0.0–1.2)
CO2: 21 mmol/L (ref 20–29)
Calcium: 9.4 mg/dL (ref 8.7–10.2)
Chloride: 102 mmol/L (ref 96–106)
Creatinine, Ser: 1.38 mg/dL — ABNORMAL HIGH (ref 0.76–1.27)
Globulin, Total: 2.8 g/dL (ref 1.5–4.5)
Glucose: 75 mg/dL (ref 70–99)
Potassium: 5.3 mmol/L — ABNORMAL HIGH (ref 3.5–5.2)
Sodium: 138 mmol/L (ref 134–144)
Total Protein: 7 g/dL (ref 6.0–8.5)
eGFR: 59 mL/min/{1.73_m2} — ABNORMAL LOW (ref >59)

## 2023-02-28 LAB — CBC WITH DIFFERENTIAL/PLATELET
Basophils Absolute: 0.1 10*3/uL (ref 0.0–0.2)
Basos: 1 %
EOS (ABSOLUTE): 0.1 10*3/uL (ref 0.0–0.4)
Eos: 2 %
Hematocrit: 46 % (ref 37.5–51.0)
Hemoglobin: 15.2 g/dL (ref 13.0–17.7)
Immature Grans (Abs): 0 10*3/uL (ref 0.0–0.1)
Immature Granulocytes: 0 %
Lymphocytes Absolute: 3.3 10*3/uL — ABNORMAL HIGH (ref 0.7–3.1)
Lymphs: 34 %
MCH: 29.5 pg (ref 26.6–33.0)
MCHC: 33 g/dL (ref 31.5–35.7)
MCV: 89 fL (ref 79–97)
Monocytes Absolute: 0.7 10*3/uL (ref 0.1–0.9)
Monocytes: 7 %
Neutrophils Absolute: 5.4 10*3/uL (ref 1.4–7.0)
Neutrophils: 56 %
Platelets: 305 10*3/uL (ref 150–450)
RBC: 5.16 x10E6/uL (ref 4.14–5.80)
RDW: 13.8 % (ref 11.6–15.4)
WBC: 9.6 10*3/uL (ref 3.4–10.8)

## 2023-02-28 LAB — LIPID PANEL
Chol/HDL Ratio: 1.7 {ratio} (ref 0.0–5.0)
Cholesterol, Total: 54 mg/dL — ABNORMAL LOW (ref 100–199)
HDL: 31 mg/dL — ABNORMAL LOW (ref >39)
LDL Chol Calc (NIH): 2 mg/dL (ref 0–99)
Triglycerides: 117 mg/dL (ref 0–149)
VLDL Cholesterol Cal: 21 mg/dL (ref 5–40)

## 2023-03-03 DIAGNOSIS — Z122 Encounter for screening for malignant neoplasm of respiratory organs: Secondary | ICD-10-CM | POA: Insufficient documentation

## 2023-03-03 NOTE — Assessment & Plan Note (Signed)
The current medical regimen is effective;  continue present plan and medications. Continue Trelegy 1 puff daily, Albuterol 108 mcg inhaler 2 puff every 6 hours PRN

## 2023-03-03 NOTE — Assessment & Plan Note (Signed)
Stable

## 2023-03-03 NOTE — Assessment & Plan Note (Signed)
The current medical regimen is effective;  continue present plan and medications. Continue valsartan 80 mg daily, metoprolol 50 mg twice daily

## 2023-03-03 NOTE — Assessment & Plan Note (Signed)
Stable. Continue Plavix 75 mg daily, metoprolol 50 mg twice daily, rosuvastatin 40 mg daily at bedtime, and repatha

## 2023-03-03 NOTE — Assessment & Plan Note (Signed)
The current medical regimen is effective;  continue present plan and medications. Continue repatha, rosuvastatin, fish oil.

## 2023-03-09 DIAGNOSIS — Z0489 Encounter for examination and observation for other specified reasons: Secondary | ICD-10-CM | POA: Diagnosis not present

## 2023-03-09 DIAGNOSIS — Z122 Encounter for screening for malignant neoplasm of respiratory organs: Secondary | ICD-10-CM | POA: Diagnosis not present

## 2023-03-09 DIAGNOSIS — R918 Other nonspecific abnormal finding of lung field: Secondary | ICD-10-CM | POA: Diagnosis not present

## 2023-03-09 DIAGNOSIS — Z87891 Personal history of nicotine dependence: Secondary | ICD-10-CM | POA: Diagnosis not present

## 2023-03-12 ENCOUNTER — Encounter: Payer: Self-pay | Admitting: Family Medicine

## 2023-03-12 ENCOUNTER — Other Ambulatory Visit: Payer: Self-pay

## 2023-03-12 DIAGNOSIS — Z122 Encounter for screening for malignant neoplasm of respiratory organs: Secondary | ICD-10-CM

## 2023-03-12 DIAGNOSIS — R918 Other nonspecific abnormal finding of lung field: Secondary | ICD-10-CM

## 2023-03-16 DIAGNOSIS — N1831 Chronic kidney disease, stage 3a: Secondary | ICD-10-CM | POA: Diagnosis not present

## 2023-03-19 ENCOUNTER — Other Ambulatory Visit: Payer: Self-pay | Admitting: Family Medicine

## 2023-03-19 DIAGNOSIS — E782 Mixed hyperlipidemia: Secondary | ICD-10-CM

## 2023-03-19 DIAGNOSIS — N2581 Secondary hyperparathyroidism of renal origin: Secondary | ICD-10-CM | POA: Diagnosis not present

## 2023-03-19 DIAGNOSIS — I129 Hypertensive chronic kidney disease with stage 1 through stage 4 chronic kidney disease, or unspecified chronic kidney disease: Secondary | ICD-10-CM | POA: Diagnosis not present

## 2023-03-19 DIAGNOSIS — N189 Chronic kidney disease, unspecified: Secondary | ICD-10-CM | POA: Diagnosis not present

## 2023-03-19 DIAGNOSIS — I119 Hypertensive heart disease without heart failure: Secondary | ICD-10-CM

## 2023-03-19 DIAGNOSIS — N182 Chronic kidney disease, stage 2 (mild): Secondary | ICD-10-CM | POA: Diagnosis not present

## 2023-03-19 DIAGNOSIS — D631 Anemia in chronic kidney disease: Secondary | ICD-10-CM | POA: Diagnosis not present

## 2023-03-21 DIAGNOSIS — G4733 Obstructive sleep apnea (adult) (pediatric): Secondary | ICD-10-CM | POA: Diagnosis not present

## 2023-03-30 ENCOUNTER — Encounter: Payer: Self-pay | Admitting: *Deleted

## 2023-03-30 ENCOUNTER — Other Ambulatory Visit: Payer: Self-pay | Admitting: *Deleted

## 2023-03-30 NOTE — Patient Instructions (Signed)
Visit Information  Thank you for taking time to visit with me today. Please don't hesitate to contact me if I can be of assistance to you before our next scheduled telephone appointment.  Following are the goals we discussed today:   Goals Addressed             This Visit's Progress    RNCM Care Management  Expected Outcome:  Monitor, Self-Manage and Reduce Symptoms of:HLD       Current Barriers:  Care Coordination needs related to medication needs and ongoing support and education from the pharm D for management of medications in a patient with HLD Chronic Disease Management support and education needs related to effective management of HLD   Lab Results  Component Value Date   CHOL 54 (L) 02/27/2023   HDL 31 (L) 02/27/2023   LDLCALC 2 02/27/2023   TRIG 117 02/27/2023   CHOLHDL 1.7 02/27/2023    Planned Interventions: Provider established cholesterol goals reviewed, Review of labs, education provided. The patient is compliant with the plan of care. Reports no concern related to HLD. Counseled on importance of regular laboratory monitoring as prescribed. Labs are up to date and stable at this time. Provided HLD educational materials. Review of heart health and the importance of making sure he takes his medications and eating healthy.  Reviewed role and benefits of statin for ASCVD risk reduction; Continues with Rosuvastatin 40 mg daily at bedtime, Repatha 140 mg every 14 days, Fish Oil 1000 mg twice a day.  Discussed strategies to manage statin-induced myalgias. Denies myalgias. Reviewed importance of limiting foods high in cholesterol. Education provided on a heart healthy diet. The patient only drinks coffee per his wife. Instructed on the need to drink water and stay hydrated for kidney health and overall health and well being.; Reviewed exercise goals and target of 150 minutes per week; Screening for signs and symptoms of depression related to chronic disease state;  Assessed  social determinant of health barriers;    Symptom Management: Take medications as prescribed   Attend all scheduled provider appointments Call provider office for new concerns or questions  call the Suicide and Crisis Lifeline: 988 call the [place] National Suicide Prevention Lifeline: 506-499-4080 or TTY: (612)362-9425 TTY 9255711081) to talk to a trained counselor call 1-800-273-TALK (toll free, 24 hour hotline) if experiencing a Mental Health or Behavioral Health Crisis  - call for medicine refill 2 or 3 days before it runs out - take all medications exactly as prescribed - call doctor with any symptoms you believe are related to your medicine - call doctor when you experience any new symptoms - go to all doctor appointments as scheduled - adhere to prescribed diet: heart healthy  Follow Up Plan: Telephone follow up appointment with care management team member scheduled for: 05-01-2023 at 1:00 pm       RNCM Care Management Expected Outcome:  Monitor, Self-Manage, and Reduce Symptoms of Hypertension       Current Barriers:  Chronic Disease Management support and education needs related to effective management of HTN  BP Readings from Last 3 Encounters:  02/27/23 108/70  11/22/22 127/78  09/27/22 122/80    Planned Interventions: Evaluation of current treatment plan related to hypertension self management and patient's adherence to plan as established by provider. Patient reports his blood pressure have been doing good and stated his last check at home it was 130/70.  He denies any dizziness or headaches at this time. Provided education to patient re:  stroke prevention, s/s of heart attack and stroke. Review of the patient being at increase risk of heart attack and stroke due to elevated blood pressures.  Review of monitoring for chest pain, arm pain, changes in the way her feels, headaches and to seek emergent help for sx and sx of pending heart attack or stroke.  Reviewed  prescribed diet heart healthy diet.The patient is compliant with heart healthy diet.  Reviewed medications with patient and discussed importance of compliance. The patient is he is compliant with valsartan 80 mg daily, metoprolol 50 mg twice a day Discussed plans with patient for ongoing care management follow up and provided patient with direct contact information for care management team; Advised patient, providing education and rationale, to monitor blood pressure daily and record, calling PCP for findings outside established parameters; RNCM advised to keep record of blood pressures to provide during outreach and to provider during his office visits to help the provider best treat his hypertension. Reviewed scheduled/upcoming provider appointments including: 06-11-2023 with PCP Advised patient to discuss changes in heart health, questions or concerns  with provider; Provided education on prescribed diet heart healthy diet. Education on staying hydrated and monitoring foods high in potassium;  Discussed complications of poorly controlled blood pressure such as heart disease, stroke, circulatory complications, vision complications, kidney impairment, sexual dysfunction;  Screening for signs and symptoms of depression related to chronic disease state;  Assessed social determinant of health barriers;   Symptom Management: Take medications as prescribed   Attend all scheduled provider appointments Call provider office for new concerns or questions  call the Suicide and Crisis Lifeline: 988 call the [place] National Suicide Prevention Lifeline: (515)159-6565 or TTY: (581)017-1958 TTY (715) 810-9997) to talk to a trained counselor call 1-800-273-TALK (toll free, 24 hour hotline) if experiencing a Mental Health or Behavioral Health Crisis  check blood pressure weekly learn about high blood pressure call doctor for signs and symptoms of high blood pressure keep all doctor appointments take medications  for blood pressure exactly as prescribed report new symptoms to your doctor  Follow Up Plan: Telephone follow up appointment with care management team member scheduled for: 05-01-2023 at 1:00 pm           Our next appointment is by telephone on 05-01-2023 at 1:00 pm  Please call the care guide team at [phone] if you need to cancel or reschedule your appointment.   If you are experiencing a Mental Health or Behavioral Health Crisis or need someone to talk to, please call the Suicide and Crisis Lifeline: 988 call the [place] National Suicide Prevention Lifeline: 574-413-2567 or TTY: 604-094-1276 TTY 301 267 5468) to talk to a trained counselor call 1-800-273-TALK (toll free, 24 hour hotline) call 911   Patient verbalizes understanding of instructions and care plan provided today and agrees to view in MyChart. Active MyChart status and patient understanding of how to access instructions and care plan via MyChart confirmed with patient.     Telephone follow up appointment with care management team member scheduled for:05-01-2023 at 1:00 pm  Rosalene Billings, BSN RN [facility], [facility] RN Care Manager Direct Dial: [phone]  Fax: [phone]

## 2023-03-30 NOTE — Patient Outreach (Signed)
Care Management   Visit Note  03/30/2023 Name: Scott Foley MRN: [id number] DOB: 02-Oct-1965  Subjective: Scott Foley is a 58 y.o. year old male who is a primary care patient of [name], [name], MD. The Care Management team was consulted for assistance.      Engaged with patient spoke with patient by telephone.    Goals Addressed             This Visit's Progress    RNCM Care Management  Expected Outcome:  Monitor, Self-Manage and Reduce Symptoms of:HLD       Current Barriers:  Care Coordination needs related to medication needs and ongoing support and education from the pharm D for management of medications in a patient with HLD Chronic Disease Management support and education needs related to effective management of HLD   Lab Results  Component Value Date   CHOL 54 (L) 02/27/2023   HDL 31 (L) 02/27/2023   LDLCALC 2 02/27/2023   TRIG 117 02/27/2023   CHOLHDL 1.7 02/27/2023    Planned Interventions: Provider established cholesterol goals reviewed, Review of labs, education provided. The patient is compliant with the plan of care. Reports no concern related to HLD. Counseled on importance of regular laboratory monitoring as prescribed. Labs are up to date and stable at this time. Provided HLD educational materials. Review of heart health and the importance of making sure he takes his medications and eating healthy.  Reviewed role and benefits of statin for ASCVD risk reduction; Continues with Rosuvastatin 40 mg daily at bedtime, Repatha 140 mg every 14 days, Fish Oil 1000 mg twice a day.  Discussed strategies to manage statin-induced myalgias. Denies myalgias. Reviewed importance of limiting foods high in cholesterol. Education provided on a heart healthy diet. The patient only drinks coffee per his wife. Instructed on the need to drink water and stay hydrated for kidney health and overall health and well being.; Reviewed exercise goals and target of 150 minutes  per week; Screening for signs and symptoms of depression related to chronic disease state;  Assessed social determinant of health barriers;    Symptom Management: Take medications as prescribed   Attend all scheduled provider appointments Call provider office for new concerns or questions  call the Suicide and Crisis Lifeline: 988 call the [place] National Suicide Prevention Lifeline: (734)274-6917 or TTY: (910)093-9856 TTY 508-829-3488) to talk to a trained counselor call 1-800-273-TALK (toll free, 24 hour hotline) if experiencing a Mental Health or Behavioral Health Crisis  - call for medicine refill 2 or 3 days before it runs out - take all medications exactly as prescribed - call doctor with any symptoms you believe are related to your medicine - call doctor when you experience any new symptoms - go to all doctor appointments as scheduled - adhere to prescribed diet: heart healthy  Follow Up Plan: Telephone follow up appointment with care management team member scheduled for: 05-01-2023 at 1:00 pm       RNCM Care Management Expected Outcome:  Monitor, Self-Manage, and Reduce Symptoms of Hypertension       Current Barriers:  Chronic Disease Management support and education needs related to effective management of HTN  BP Readings from Last 3 Encounters:  02/27/23 108/70  11/22/22 127/78  09/27/22 122/80    Planned Interventions: Evaluation of current treatment plan related to hypertension self management and patient's adherence to plan as established by provider. Patient reports his blood pressure have been doing good and stated his last check at  home it was 130/70.  He denies any dizziness or headaches at this time. Provided education to patient re: stroke prevention, s/s of heart attack and stroke. Review of the patient being at increase risk of heart attack and stroke due to elevated blood pressures.  Review of monitoring for chest pain, arm pain, changes in the way her feels,  headaches and to seek emergent help for sx and sx of pending heart attack or stroke.  Reviewed prescribed diet heart healthy diet.The patient is compliant with heart healthy diet.  Reviewed medications with patient and discussed importance of compliance. The patient is he is compliant with valsartan 80 mg daily, metoprolol 50 mg twice a day Discussed plans with patient for ongoing care management follow up and provided patient with direct contact information for care management team; Advised patient, providing education and rationale, to monitor blood pressure daily and record, calling PCP for findings outside established parameters; RNCM advised to keep record of blood pressures to provide during outreach and to provider during his office visits to help the provider best treat his hypertension. Reviewed scheduled/upcoming provider appointments including: 06-11-2023 with PCP Advised patient to discuss changes in heart health, questions or concerns  with provider; Provided education on prescribed diet heart healthy diet. Education on staying hydrated and monitoring foods high in potassium;  Discussed complications of poorly controlled blood pressure such as heart disease, stroke, circulatory complications, vision complications, kidney impairment, sexual dysfunction;  Screening for signs and symptoms of depression related to chronic disease state;  Assessed social determinant of health barriers;   Symptom Management: Take medications as prescribed   Attend all scheduled provider appointments Call provider office for new concerns or questions  call the Suicide and Crisis Lifeline: 988 call the [place] National Suicide Prevention Lifeline: 310-858-1671 or TTY: 8012776939 TTY 813-689-0679) to talk to a trained counselor call 1-800-273-TALK (toll free, 24 hour hotline) if experiencing a Mental Health or Behavioral Health Crisis  check blood pressure weekly learn about high blood pressure call doctor  for signs and symptoms of high blood pressure keep all doctor appointments take medications for blood pressure exactly as prescribed report new symptoms to your doctor  Follow Up Plan: Telephone follow up appointment with care management team member scheduled for: 05-01-2023 at 1:00 pm              Consent to Services:  Patient was given information about care management services, agreed to services, and gave verbal consent to participate.   Plan: Telephone follow up appointment with care management team member scheduled for:05-01-2023 at 1:00 pm  Rosalene Billings, BSN RN [facility], [facility] RN Care Manager Direct Dial: [phone]  Fax: [phone]

## 2023-04-16 ENCOUNTER — Other Ambulatory Visit: Payer: Self-pay | Admitting: Family Medicine

## 2023-04-16 DIAGNOSIS — I119 Hypertensive heart disease without heart failure: Secondary | ICD-10-CM

## 2023-04-16 DIAGNOSIS — Z9981 Dependence on supplemental oxygen: Secondary | ICD-10-CM

## 2023-04-16 DIAGNOSIS — E782 Mixed hyperlipidemia: Secondary | ICD-10-CM

## 2023-04-16 DIAGNOSIS — J449 Chronic obstructive pulmonary disease, unspecified: Secondary | ICD-10-CM

## 2023-04-16 DIAGNOSIS — J9611 Chronic respiratory failure with hypoxia: Secondary | ICD-10-CM

## 2023-04-21 DIAGNOSIS — G4733 Obstructive sleep apnea (adult) (pediatric): Secondary | ICD-10-CM | POA: Diagnosis not present

## 2023-05-01 ENCOUNTER — Other Ambulatory Visit: Payer: Self-pay | Admitting: *Deleted

## 2023-05-01 ENCOUNTER — Telehealth: Payer: Self-pay | Admitting: *Deleted

## 2023-05-01 NOTE — Patient Outreach (Signed)
  Care Management   Follow Up Note   05/01/2023 Name: Scott Foley MRN: [id number] DOB: Oct 03, 1965   Referred by: Blane Ohara, MD Reason for referral : Care Management (RNCM: Attempt Follow Up For Chronic Disease Management & Care Coordination Needs)   An unsuccessful telephone outreach was attempted today. The patient was referred to the case management team for assistance with care management and care coordination.   Follow Up Plan: The care management team will reach out to the patient again over the next 30 days.   Larey Brick, BSN RN [facility], [facility] RN Care Manager Direct Dial: [phone]  Fax: [phone]

## 2023-05-01 NOTE — Patient Outreach (Signed)
Care Management   Visit Note  05/01/2023 Name: Scott Foley MRN: [id number] DOB: November 19, 1965  Subjective: Scott Foley is a 58 y.o. year old male who is a primary care patient of [name], [name], MD. The Care Management team was consulted for assistance.      Engaged with patient spoke with patient by telephone.    Goals Addressed             This Visit's Progress    RNCM Care Management  Expected Outcome:  Monitor, Self-Manage and Reduce Symptoms of:HLD       Current Barriers:  Care Coordination needs related to medication needs and ongoing support and education from the pharm D for management of medications in a patient with HLD Chronic Disease Management support and education needs related to effective management of HLD   Lab Results  Component Value Date   CHOL 54 (L) 02/27/2023   HDL 31 (L) 02/27/2023   LDLCALC 2 02/27/2023   TRIG 117 02/27/2023   CHOLHDL 1.7 02/27/2023    Planned Interventions: Provider established cholesterol goals reviewed, Review of labs, education provided. The patient is compliant with the plan of care. Reports no concern related to HLD. Counseled on importance of regular laboratory monitoring as prescribed. Scheduled for fasting labs 3/17 Provided HLD educational materials. Review of heart health and the importance of making sure he takes his medications and eating healthy.  Reviewed role and benefits of statin for ASCVD risk reduction;  Discussed strategies to manage statin-induced myalgias. Denies myalgias. Reviewed importance of limiting foods high in cholesterol. Education provided on a heart healthy diet.  Instructed on the need to drink water and stay hydrated for kidney health and overall health and well being.; Reviewed exercise goals and target of 150 minutes per week; Screening for signs and symptoms of depression related to chronic disease state;  Assessed social determinant of health barriers;    Symptom  Management: Take medications as prescribed   Attend all scheduled provider appointments Call provider office for new concerns or questions  call the Suicide and Crisis Lifeline: 988 call the [place] National Suicide Prevention Lifeline: 859-510-3926 or TTY: 515-236-6335 TTY (774)662-8944) to talk to a trained counselor call 1-800-273-TALK (toll free, 24 hour hotline) if experiencing a Mental Health or Behavioral Health Crisis  - call for medicine refill 2 or 3 days before it runs out - take all medications exactly as prescribed - call doctor with any symptoms you believe are related to your medicine - call doctor when you experience any new symptoms - go to all doctor appointments as scheduled - adhere to prescribed diet: heart healthy  Follow Up Plan: Telephone follow up appointment with care management team member scheduled for: 06-29-2023 at 1:00 pm       RNCM Care Management Expected Outcome:  Monitor, Self-Manage, and Reduce Symptoms of Hypertension       Current Barriers:  Chronic Disease Management support and education needs related to effective management of HTN  BP Readings from Last 3 Encounters:  02/27/23 108/70  11/22/22 127/78  09/27/22 122/80    Planned Interventions: Evaluation of current treatment plan related to hypertension self management and patient's adherence to plan as established by provider. Patient reports stable blood pressures and  his last check at home it was 131/84.  He denies any chest pain, swelling, dizziness or headaches at this time. Provided education to patient re: stroke prevention, s/s of heart attack and stroke. Review of the patient being at increase  risk of heart attack and stroke due to elevated blood pressures.  Review of monitoring for chest pain, arm pain, changes in the way her feels, headaches and to seek emergent help for sx and sx of pending heart attack or stroke.  Reviewed prescribed diet heart healthy diet.The patient is compliant  with heart healthy diet.  Reviewed medications with patient and discussed importance of compliance. Reports compliance with all medications Discussed plans with patient for ongoing care management follow up and provided patient with direct contact information for care management team; Advised patient, providing education and rationale, to monitor blood pressure daily and record, calling PCP for findings outside established parameters; RNCM advised to keep record of blood pressures to provide during outreach and to provider during his office visits to help the provider best treat his hypertension. Reviewed scheduled/upcoming provider appointments including: 05-28-2023 with PCP Advised patient to discuss changes in heart health, questions or concerns  with provider; Provided education on prescribed diet heart healthy diet. Education on staying hydrated and monitoring foods high in potassium;  Discussed complications of poorly controlled blood pressure such as heart disease, stroke, circulatory complications, vision complications, kidney impairment, sexual dysfunction;  Screening for signs and symptoms of depression related to chronic disease state;  Assessed social determinant of health barriers;   Symptom Management: Take medications as prescribed   Attend all scheduled provider appointments Call provider office for new concerns or questions  call the Suicide and Crisis Lifeline: 988 call the [place] National Suicide Prevention Lifeline: (854) 091-7920 or TTY: 719-852-8465 TTY 478-648-6168) to talk to a trained counselor call 1-800-273-TALK (toll free, 24 hour hotline) if experiencing a Mental Health or Behavioral Health Crisis  check blood pressure weekly learn about high blood pressure call doctor for signs and symptoms of high blood pressure keep all doctor appointments take medications for blood pressure exactly as prescribed report new symptoms to your doctor  Follow Up Plan: Telephone  follow up appointment with care management team member scheduled for: 06-29-2023 at 1:00 pm            Consent to Services:  Patient was given information about care management services, agreed to services, and gave verbal consent to participate.   Plan: Telephone follow up appointment with care management team member scheduled for:06-29-2023 at 1:00 pm  Larey Brick, BSN RN [facility], [facility] RN Care Manager Direct Dial: [phone]  Fax: [phone]

## 2023-05-01 NOTE — Patient Instructions (Signed)
Visit Information  Thank you for taking time to visit with me today. Please don't hesitate to contact me if I can be of assistance to you before our next scheduled telephone appointment.  Following are the goals we discussed today:   Goals Addressed             This Visit's Progress    RNCM Care Management  Expected Outcome:  Monitor, Self-Manage and Reduce Symptoms of:HLD       Current Barriers:  Care Coordination needs related to medication needs and ongoing support and education from the pharm D for management of medications in a patient with HLD Chronic Disease Management support and education needs related to effective management of HLD   Lab Results  Component Value Date   CHOL 54 (L) 02/27/2023   HDL 31 (L) 02/27/2023   LDLCALC 2 02/27/2023   TRIG 117 02/27/2023   CHOLHDL 1.7 02/27/2023    Planned Interventions: Provider established cholesterol goals reviewed, Review of labs, education provided. The patient is compliant with the plan of care. Reports no concern related to HLD. Counseled on importance of regular laboratory monitoring as prescribed. Scheduled for fasting labs 3/17 Provided HLD educational materials. Review of heart health and the importance of making sure he takes his medications and eating healthy.  Reviewed role and benefits of statin for ASCVD risk reduction;  Discussed strategies to manage statin-induced myalgias. Denies myalgias. Reviewed importance of limiting foods high in cholesterol. Education provided on a heart healthy diet.  Instructed on the need to drink water and stay hydrated for kidney health and overall health and well being.; Reviewed exercise goals and target of 150 minutes per week; Screening for signs and symptoms of depression related to chronic disease state;  Assessed social determinant of health barriers;    Symptom Management: Take medications as prescribed   Attend all scheduled provider appointments Call provider office for  new concerns or questions  call the Suicide and Crisis Lifeline: 988 call the [place] National Suicide Prevention Lifeline: (609) 239-1491 or TTY: 337-044-3653 TTY 332-115-8395) to talk to a trained counselor call 1-800-273-TALK (toll free, 24 hour hotline) if experiencing a Mental Health or Behavioral Health Crisis  - call for medicine refill 2 or 3 days before it runs out - take all medications exactly as prescribed - call doctor with any symptoms you believe are related to your medicine - call doctor when you experience any new symptoms - go to all doctor appointments as scheduled - adhere to prescribed diet: heart healthy  Follow Up Plan: Telephone follow up appointment with care management team member scheduled for: 06-29-2023 at 1:00 pm       RNCM Care Management Expected Outcome:  Monitor, Self-Manage, and Reduce Symptoms of Hypertension       Current Barriers:  Chronic Disease Management support and education needs related to effective management of HTN  BP Readings from Last 3 Encounters:  02/27/23 108/70  11/22/22 127/78  09/27/22 122/80    Planned Interventions: Evaluation of current treatment plan related to hypertension self management and patient's adherence to plan as established by provider. Patient reports stable blood pressures and  his last check at home it was 131/84.  He denies any chest pain, swelling, dizziness or headaches at this time. Provided education to patient re: stroke prevention, s/s of heart attack and stroke. Review of the patient being at increase risk of heart attack and stroke due to elevated blood pressures.  Review of monitoring for chest pain, arm  pain, changes in the way her feels, headaches and to seek emergent help for sx and sx of pending heart attack or stroke.  Reviewed prescribed diet heart healthy diet.The patient is compliant with heart healthy diet.  Reviewed medications with patient and discussed importance of compliance. Reports compliance  with all medications Discussed plans with patient for ongoing care management follow up and provided patient with direct contact information for care management team; Advised patient, providing education and rationale, to monitor blood pressure daily and record, calling PCP for findings outside established parameters; RNCM advised to keep record of blood pressures to provide during outreach and to provider during his office visits to help the provider best treat his hypertension. Reviewed scheduled/upcoming provider appointments including: 05-28-2023 with PCP Advised patient to discuss changes in heart health, questions or concerns  with provider; Provided education on prescribed diet heart healthy diet. Education on staying hydrated and monitoring foods high in potassium;  Discussed complications of poorly controlled blood pressure such as heart disease, stroke, circulatory complications, vision complications, kidney impairment, sexual dysfunction;  Screening for signs and symptoms of depression related to chronic disease state;  Assessed social determinant of health barriers;   Symptom Management: Take medications as prescribed   Attend all scheduled provider appointments Call provider office for new concerns or questions  call the Suicide and Crisis Lifeline: 988 call the [place] National Suicide Prevention Lifeline: 510-315-0834 or TTY: 8128529702 TTY (949)771-6454) to talk to a trained counselor call 1-800-273-TALK (toll free, 24 hour hotline) if experiencing a Mental Health or Behavioral Health Crisis  check blood pressure weekly learn about high blood pressure call doctor for signs and symptoms of high blood pressure keep all doctor appointments take medications for blood pressure exactly as prescribed report new symptoms to your doctor  Follow Up Plan: Telephone follow up appointment with care management team member scheduled for: 06-29-2023 at 1:00 pm           Our next  appointment is by telephone on 06-29-2023 at 1:00 pm  Please call the care guide team at [phone] if you need to cancel or reschedule your appointment.   If you are experiencing a Mental Health or Behavioral Health Crisis or need someone to talk to, please call the Suicide and Crisis Lifeline: 988 call the [place] National Suicide Prevention Lifeline: 2531789033 or TTY: 234-854-6414 TTY 860-727-5886) to talk to a trained counselor call 1-800-273-TALK (toll free, 24 hour hotline)   Patient verbalizes understanding of instructions and care plan provided today and agrees to view in MyChart. Active MyChart status and patient understanding of how to access instructions and care plan via MyChart confirmed with patient.     Telephone follow up appointment with care management team member scheduled for:06-29-2023 at 1:00 pm   Larey Brick, BSN RN [facility], [facility] Health RN Care Manager Direct Dial: [phone]  Fax: [phone]

## 2023-05-07 ENCOUNTER — Ambulatory Visit: Payer: Self-pay | Admitting: Family Medicine

## 2023-05-07 NOTE — Telephone Encounter (Signed)
 Copied from CRM [id number]. Topic: Clinical - Red Word Triage >> May 07, 2023  9:25 AM Prudencio Pair wrote: Red Word that prompted transfer to Nurse Triage: Alvis Lemmings, wife of patient, calling in stating that patient is having issues with allergies. States he took OTC allergy med & bp was 147/92. Thinks meds may be raising blood pressure so he stopped taking it. Took benadryl & bp was 144/102. Took it again this morning and bottom number is still elevated. Wants to know if meds could be the cause of elevated bp?   Chief Complaint: elevated BP readings Symptoms: elevated BP readings 140s/90-100s,  Frequency: continual Pertinent Negatives: Patient wife denies chest pain, SOB, blurred vision, headache, weakness, numbness, dizziness, seeming out of it Disposition: [] 911 / [] ED /[] Urgent Care (no appt availability in office) / [x] Appointment(In office/virtual)/ []  [facility]/ [] Home Care/ [] Refused Recommended Disposition /[] [place] [facility]/ []  Follow-up with PCP Additional Notes: Pt wife reporting pt starting taking OTC allergy meds 3 days ago for "left eye watering, really red, itching, sneezing, eye itself was red but been rubbing it, no yellowish drainage, no red streaking from the eye, down below a little puffy but looks great today, looks better today," and his BP started getting elevated with the start of allergy meds. Pt wife reporting pt stopped taking allergy meds including benadryl, last dose was Saturday but still having elevated BP readings. Pt wife reporting previous BP readings over past few days of 147/92 and just last night 144/102, took BP earlier this morning to reading don't remember but diastolic "not above 90," then took BP few minutes before phone call and reading was 141/97, confirmed been at least 5 min. Nurse asking if can take BP again now for reading of 143/95. Pt wife confirms pt "been on top of" his BP meds, taking on time, not had any cardiac/neurologic symptoms the  whole time. Pt wife reporting she is "worried if don't get below 90s gonna be more damage to kidneys" since hx of kidney disease. Advised pt be examined in next 3 days, pt and wife unable to come in today, scheduled for tomorrow with PCP office, confirmed location/appt info. Advised call back if becomes symptomatic or any other concerns, pt wife verbalized understanding.  Reason for Disposition  Systolic BP  >= 160 OR Diastolic >= 100  Answer Assessment - Initial Assessment Questions 1. BLOOD PRESSURE: "What is the blood pressure?" "Did you take at least two measurements 5 minutes apart?"     This morning few min ago 141/97, 143/95 5 min later 2. ONSET: "When did you take your blood pressure?"     When started taking the allergy pills, 3 days ago 3. HOW: "How did you take your blood pressure?" (e.g., automatic home BP monitor, visiting nurse)     At home BP cuff 4. HISTORY: "Do you have a history of high blood pressure?"     yes 5. MEDICINES: "Are you taking any medicines for blood pressure?" "Have you missed any doses recently?"     Denies, been on top of it 6. OTHER SYMPTOMS: "Do you have any symptoms?" (e.g., blurred vision, chest pain, difficulty breathing, headache, weakness)     denies  Protocols used: Blood Pressure - High-A-AH

## 2023-05-08 ENCOUNTER — Ambulatory Visit (INDEPENDENT_AMBULATORY_CARE_PROVIDER_SITE_OTHER): Payer: Medicare PPO | Admitting: Physician Assistant

## 2023-05-08 ENCOUNTER — Encounter: Payer: Self-pay | Admitting: Physician Assistant

## 2023-05-08 VITALS — BP 142/92 | HR 62 | Temp 97.8°F | Ht 67.0 in | Wt 239.0 lb

## 2023-05-08 DIAGNOSIS — H109 Unspecified conjunctivitis: Secondary | ICD-10-CM | POA: Diagnosis not present

## 2023-05-08 DIAGNOSIS — I119 Hypertensive heart disease without heart failure: Secondary | ICD-10-CM

## 2023-05-08 MED ORDER — VALSARTAN 80 MG PO TABS: 80.0000 mg | ORAL_TABLET | Freq: Every day | ORAL | 0 refills | Status: DC

## 2023-05-08 MED ORDER — CIPROFLOXACIN HCL 0.3 % OP SOLN: 2.0000 [drp] | OPHTHALMIC | 0 refills | Status: DC

## 2023-05-08 NOTE — Progress Notes (Signed)
 Subjective:  Patient ID: Scott Foley, male    DOB: April 17, 1965  Age: 58 y.o. MRN: [id number]  Chief Complaint  Patient presents with   Hypertension   Left eye red    Discussed the use of AI scribe software for clinical note transcription with the patient, who gave verbal consent to proceed.  History of Present Illness   Brett Canales, a patient with a history of hypertension, presents for evaluation of persistently high blood pressure despite current medication regimen. He denies experiencing any associated symptoms such as headaches, dizziness, vision changes, chest pain, or difficulty breathing. He also denies any changes in urine color or back pain. His blood pressure has occasionally decreased but tends to rise again, typically towards the end of the day. He has not noticed any heart racing, increased fatigue, leg heaviness, or difficulty breathing while lying down. He consumes a pot of coffee daily, spread throughout the day. His kidney function has recently decreased, but he denies any symptoms suggestive of kidney disease.           03/30/2023    1:07 PM 09/27/2022    2:09 PM 08/04/2022    9:10 AM 02/24/2022    2:08 PM 09/12/2021    2:02 PM  Depression screen PHQ 2/9  Decreased Interest 0 0 0 0 0  Down, Depressed, Hopeless 0 0 0 0 0  PHQ - 2 Score 0 0 0 0 0  Altered sleeping  0 0    Tired, decreased energy  2 2    Change in appetite  0 0    Feeling bad or failure about yourself   0 0    Trouble concentrating  0 0    Moving slowly or fidgety/restless  0 0    Suicidal thoughts  0 0    PHQ-9 Score  2 2    Difficult doing work/chores  Not difficult at all Not difficult at all          09/25/2022    1:46 PM  Fall Risk   Falls in the past year? 0  Number falls in past yr: 0  Injury with Fall? 0  Risk for fall due to : No Fall Risks  Follow up Falls evaluation completed    Patient Care Team: Blane Ohara, MD as PCP - General (Family Medicine) Croitoru, Rachelle Hora, MD as PCP  - Cardiology (Cardiology) Dagoberto Ligas, MD as Consulting Physician (Nephrology) Ricky Stabs, RN as [facility] Care Management (General Practice)   Review of Systems  Constitutional:  Negative for appetite change, fatigue and fever.  HENT:  Negative for congestion, ear pain, sinus pressure and sore throat.   Eyes:  Positive for redness (left eye).  Respiratory:  Negative for cough, chest tightness, shortness of breath and wheezing.   Cardiovascular:  Negative for chest pain and palpitations.  Gastrointestinal:  Negative for abdominal pain, constipation, diarrhea, nausea and vomiting.  Genitourinary:  Negative for dysuria and hematuria.  Musculoskeletal:  Negative for arthralgias, back pain, joint swelling and myalgias.  Skin:  Negative for rash.  Neurological:  Negative for dizziness, weakness and headaches.  Psychiatric/Behavioral:  Negative for dysphoric mood. The patient is not nervous/anxious.     Current Outpatient Medications on File Prior to Visit  Medication Sig Dispense Refill   albuterol (VENTOLIN HFA) 108 (90 Base) MCG/ACT inhaler INHALE 2 PUFFS BY MOUTH INTO LUNGS EVERY 6 HOURS AS NEEDED FOR SHORTNESS OF BREATH OR WHEEZING 17 g 0   clopidogrel (PLAVIX) 75  MG tablet TAKE ONE TABLET BY MOUTH EVERY MORNING 90 tablet 1   cyanocobalamin (VITAMIN B12) 1000 MCG tablet TAKE ONE TABLET BY MOUTH EVERY MORNING 90 tablet 3   ELIQUIS 5 MG TABS tablet TAKE ONE TABLET BY MOUTH TWICE DAILY 180 tablet 1   Fluticasone-Umeclidin-Vilant (TRELEGY ELLIPTA) 100-62.5-25 MCG/ACT AEPB Inhale 1 puff into the lungs daily. 1 each 11   icosapent Ethyl (VASCEPA) 1 g capsule Take 2 capsules (2 g total) by mouth 2 (two) times daily. 120 capsule 2   metoprolol tartrate (LOPRESSOR) 50 MG tablet TAKE ONE TABLET BY MOUTH TWICE DAILY 240 tablet 0   Multiple Vitamin (MULTIVITAMIN WITH MINERALS) TABS tablet Take 1 tablet by mouth daily.     Omega-3 Fatty Acids (FISH OIL) 1000 MG CAPS Take 1 capsule (1,000 mg total)  by mouth 2 (two) times daily. 180 capsule 0   ondansetron (ZOFRAN) 4 MG tablet Take 1 tablet (4 mg total) by mouth every 8 (eight) hours as needed for nausea or vomiting. 20 tablet 0   pantoprazole (PROTONIX) 40 MG tablet TAKE ONE TABLET BY MOUTH EVERY MORNING AND TAKE ONE TABLET BY MOUTH EVERY EVENING 240 tablet 0   REPATHA SURECLICK 140 MG/ML SOAJ INJECT 140MG  ( ) INTO THE SKIN EVERY 14 DAYS 2 mL 0   rosuvastatin (CRESTOR) 40 MG tablet TAKE ONE TABLET BY MOUTH EVERY EVENING 120 tablet 0   valsartan (DIOVAN) 80 MG tablet Take 1 tablet (80 mg total) by mouth daily. 90 tablet 3   No current facility-administered medications on file prior to visit.   Past Medical History:  Diagnosis Date   Acquired thrombophilia (HCC) 12/06/2020   B12 deficiency 07/04/2021   Chronic obstructive pulmonary disease (HCC) 07/16/2018   Class 2 severe obesity due to excess calories with serious comorbidity and body mass index (BMI) of 37.0 to 37.9 in adult (HCC) 12/06/2020   BMI 35 with serious comorbidities including CAD, HTN, STROKE, Hyperlipidemia.   Colon polyp 10/25/2020   serrated adenoma.   Coronary artery disease due to lipid rich plaque 01/01/2021   Coronary artery disease with stable angina pectoris [facility])    AMI No heart cath, treated medically [place] [facility]   Daytime somnolence 08/04/2022   Diverticulosis of colon 10/25/2020   Noted on colnoscopy by Dr Tobi Bastos in [place].   DNR (do not resuscitate) 03/03/2019   History of blood clotting disorder 06/11/2021   History of CVA (cerebrovascular accident) 07/16/2018   Formatting of this note might be different from the original. CVA 3 years ago left with short term memory deficits. Has been on Plavix since.   History of pulmonary embolism 02/15/2021   Hyperlipemia    Hypertensive heart disease without congestive heart failure 12/06/2020   Hypoglycemia 04/15/2021   Hypokalemia 04/13/2021   Hypomagnesemia 06/11/2021   Impingement of left shoulder 04/05/2022    Intra-abdominal fluid collection 07/18/2021   Medication monitoring encounter 06/24/2021   Mixed hyperlipidemia 07/22/2018   Morbid obesity (HCC) 12/06/2020   BMI 35 with serious comorbidities including CAD, HTN, STROKE, Hyperlipidemia.   Normocytic anemia 04/15/2021   Other fatigue 12/06/2020   Pericardial effusion 04/29/2021   Protein-calorie malnutrition, severe 04/15/2021   Pulmonary nodules/lesions, multiple 11/25/2012   9/15/2014CT Chest : small nodule RUL LUL. Stable since 03/2012 Smoking cessation advised and Rx chantix Repeat CT Chest 4 /21/15: subpleural lymph nodes, likely benign repeat one year 2016>>>no change, considered benign Cleda Daub 11/25/2012  NORMAL   Sequela, post-stroke 07/28/2019   Sleep apnea    CPAP  VRE (vancomycin-resistant Enterococci) infection 05/11/2021   Past Surgical History:  Procedure Laterality Date   COLONOSCOPY WITH PROPOFOL N/A 10/25/2020   Wyline Mood, MD, at [facility]. 25 MM serrated adenoma polyp at ascending colon removed and site clipped.  Pan diverticulosis.   IR CATHETER TUBE CHANGE  09/22/2021   IR CATHETER TUBE CHANGE  10/10/2021   IR RADIOLOGIST EVAL & MGMT  05/25/2021   IR RADIOLOGIST EVAL & MGMT  08/09/2021   IR RADIOLOGIST EVAL & MGMT  08/23/2021   IR RADIOLOGIST EVAL & MGMT  09/21/2021   IR RADIOLOGIST EVAL & MGMT  10/07/2021   LAPAROSCOPIC CHOLECYSTECTOMY  02/2021   PERICARDIOCENTESIS N/A 05/01/2021   Procedure: PERICARDIOCENTESIS;  Surgeon: [place], Peter M, MD;  Location[facility] INVASIVE CV LAB;  Service: Cardiovascular;  Laterality: N/A;   TONSILLECTOMY      Family History  Problem Relation Age of Onset   Pulmonary embolism Mother    Hypertension Father    Heart attack Father    Stroke Father    Prostate cancer Father    Lung disease Father    Cancer Sister 27       lung cancer   Hypertension Sister    Cancer Sister 102       lung cancer   Lung cancer Sister    Cancer Brother        lung   Hypertension Brother    Heart disease  Brother    Cancer Brother 62       throat and lung cancer   Heart attack Brother    Cancer - Lung Brother    Brain cancer Brother    Social History   Socioeconomic History   Marital status: Married    Spouse name: Dawn   Number of children: 2   Years of education: Not on file   Highest education level: GED or equivalent  Occupational History   Not on file  Tobacco Use   Smoking status: Former    Current packs/day: 0.00    Average packs/day: 0.8 packs/day for 41.7 years (31.2 ttl pk-yrs)    Types: Cigarettes    Start date: 03/14/1979    Quit date: 11/2020    Years since quitting: 2.5   Smokeless tobacco: Never  Vaping Use   Vaping status: Former   Start date: 07/28/2018  Substance and Sexual Activity   Alcohol use: No   Drug use: No   Sexual activity: Yes    Partners: Female  Other Topics Concern   Not on file  Social History Narrative   Not on file   Social Drivers of Health   Financial Resource Strain: Low Risk  (05/07/2023)   Overall Financial Resource Strain (CARDIA)    Difficulty of Paying Living Expenses: Not hard at all  Food Insecurity: No Food Insecurity (05/07/2023)   Hunger Vital Sign    Worried About Running Out of Food in the Last Year: Never true    Ran Out of Food in the Last Year: Never true  Transportation Needs: No Transportation Needs (05/07/2023)   PRAPARE - Administrator, Civil Service (Medical): No    Lack of Transportation (Non-Medical): No  Physical Activity: Insufficiently Active (05/07/2023)   Exercise Vital Sign    Days of Exercise per Week: 4 days    Minutes of Exercise per Session: 30 min  Stress: No Stress Concern Present (05/07/2023)   [employer] of Occupational Health - Occupational Stress Questionnaire    Feeling of Stress :  Not at all  Social Connections: Moderately Integrated (05/07/2023)   Social Connection and Isolation Panel [NHANES]    Frequency of Communication with Friends and Family: More than three times  a week    Frequency of Social Gatherings with Friends and Family: More than three times a week    Attends Religious Services: More than 4 times per year    Active Member of [employer] or Organizations: No    Attends Banker Meetings: Never    Marital Status: Married  Recent Concern: Social Connections - Moderately Isolated (03/30/2023)   Social Connection and Isolation Panel [NHANES]    Frequency of Communication with Friends and Family: More than three times a week    Frequency of Social Gatherings with Friends and Family: Once a week    Attends Religious Services: Never    Database administrator or Organizations: No    Attends Engineer, structural: Never    Marital Status: Married    Objective:  BP (!) 142/92 (BP Location: Left Arm, Patient Position: Sitting)   Pulse 62   Temp 97.8 F (36.6 C) (Temporal)   Ht 5\' 7"  (1.702 m)   Wt 239 lb (108.4 kg)   SpO2 98%   BMI 37.43 kg/m      05/08/2023   11:12 AM 02/27/2023    1:59 PM 11/22/2022    2:59 PM  BP/Weight  Systolic BP 142 108 127  Diastolic BP 92 70 78  Wt. (Lbs) 239 238 231.6  BMI 37.43 kg/m2 37.28 kg/m2 39.75 kg/m2    Physical Exam Vitals reviewed.  Constitutional:      Appearance: Normal appearance.  Eyes:     General: Lids are everted, no foreign bodies appreciated. Vision grossly intact.        Right eye: No foreign body.        Left eye: Discharge present.No foreign body.     Conjunctiva/sclera:     Right eye: Right conjunctiva is not injected.     Left eye: Left conjunctiva is injected.  Neck:     Vascular: No carotid bruit.  Cardiovascular:     Rate and Rhythm: Normal rate and regular rhythm.     Heart sounds: Normal heart sounds.  Pulmonary:     Effort: Pulmonary effort is normal.     Breath sounds: Normal breath sounds.  Abdominal:     General: Bowel sounds are normal.     Palpations: Abdomen is soft.     Tenderness: There is no abdominal tenderness.  Neurological:     Mental  Status: He is alert and oriented to person, place, and time.  Psychiatric:        Mood and Affect: Mood normal.        Behavior: Behavior normal.     Diabetic Foot Exam - Simple   No data filed      Lab Results  Component Value Date   WBC 9.6 02/27/2023   HGB 15.2 02/27/2023   HCT 46.0 02/27/2023   PLT 305 02/27/2023   GLUCOSE 75 02/27/2023   CHOL 54 (L) 02/27/2023   TRIG 117 02/27/2023   HDL 31 (L) 02/27/2023   LDLCALC 2 02/27/2023   ALT 23 02/27/2023   AST 17 02/27/2023   NA 138 02/27/2023   K 5.3 (H) 02/27/2023   CL 102 02/27/2023   CREATININE 1.38 (H) 02/27/2023   BUN 24 02/27/2023   CO2 21 02/27/2023   TSH 3.170 04/05/2022   INR 1.2  07/21/2021      Assessment & Plan:    Hypertensive heart disease without congestive heart failure Assessment & Plan: Elevated blood pressure readings despite current treatment with Metoprolol and Valsartan. No associated symptoms of headache, dizziness, vision changes, chest pain, or shortness of breath. No signs of fluid overload on physical examination. - Increase Valsartan to 80mg  twice daily. - Monitor blood pressure closely over the next week. If readings remain above 140/90 for more than half the days of the week, patient to notify the clinic. - Advise to decrease coffee intake and increase water intake due to potential effects of caffeine on blood pressure. - Follow-up blood work with Dr. Sedalia Muta on 04/30/2023 to monitor kidney function.  Orders: -     Valsartan; Take 1 tablet (80 mg total) by mouth daily.  Dispense: 30 tablet; Refill: 0  Bacterial conjunctivitis of left eye Assessment & Plan: No sign of infection in the right eye Denies itching, burning Admits to crusting in the morning Denies fever or vision changes  Orders: -     Ciprofloxacin HCl; Place 2 drops into both eyes every 2 (two) hours. Administer 1 drop, every 2 hours, while awake, for 2 days. Then 1 drop, every 4 hours, while awake, for the next 5 days.   Dispense: 10 mL; Refill: 0     Meds ordered this encounter  Medications   valsartan (DIOVAN) 80 MG tablet    Sig: Take 1 tablet (80 mg total) by mouth daily.    Dispense:  30 tablet    Refill:  0   ciprofloxacin (CILOXAN) 0.3 % ophthalmic solution    Sig: Place 2 drops into both eyes every 2 (two) hours. Administer 1 drop, every 2 hours, while awake, for 2 days. Then 1 drop, every 4 hours, while awake, for the next 5 days.    Dispense:  10 mL    Refill:  0    No orders of the defined types were placed in this encounter.       Follow-up: No follow-ups on file.   I,Lauren M Auman,acting as a Neurosurgeon for [employer], [employer].,have documented all relevant documentation on the behalf of Langley Gauss, PA,as directed by  Langley Gauss, PA while in the presence of Langley Gauss, [place].   An After Visit Summary was printed and given to the patient.  Langley Gauss, [place] [name] [phone]

## 2023-05-14 DIAGNOSIS — H109 Unspecified conjunctivitis: Secondary | ICD-10-CM | POA: Insufficient documentation

## 2023-05-14 NOTE — Assessment & Plan Note (Signed)
 Elevated blood pressure readings despite current treatment with Metoprolol and Valsartan. No associated symptoms of headache, dizziness, vision changes, chest pain, or shortness of breath. No signs of fluid overload on physical examination. - Increase Valsartan to 80mg  twice daily. - Monitor blood pressure closely over the next week. If readings remain above 140/90 for more than half the days of the week, patient to notify the clinic. - Advise to decrease coffee intake and increase water intake due to potential effects of caffeine on blood pressure. - Follow-up blood work with Dr. Sedalia Muta on 04/30/2023 to monitor kidney function.

## 2023-05-14 NOTE — Assessment & Plan Note (Signed)
 No sign of infection in the right eye Denies itching, burning Admits to crusting in the morning Denies fever or vision changes

## 2023-05-15 ENCOUNTER — Ambulatory Visit: Payer: Medicare PPO | Admitting: Physician Assistant

## 2023-05-15 ENCOUNTER — Other Ambulatory Visit: Payer: Self-pay | Admitting: Family Medicine

## 2023-05-15 ENCOUNTER — Other Ambulatory Visit: Payer: Self-pay | Admitting: Physician Assistant

## 2023-05-15 ENCOUNTER — Other Ambulatory Visit: Payer: Self-pay

## 2023-05-15 DIAGNOSIS — I2782 Chronic pulmonary embolism: Secondary | ICD-10-CM

## 2023-05-15 DIAGNOSIS — J449 Chronic obstructive pulmonary disease, unspecified: Secondary | ICD-10-CM

## 2023-05-15 DIAGNOSIS — Z9981 Dependence on supplemental oxygen: Secondary | ICD-10-CM

## 2023-05-15 DIAGNOSIS — H109 Unspecified conjunctivitis: Secondary | ICD-10-CM

## 2023-05-15 DIAGNOSIS — J9611 Chronic respiratory failure with hypoxia: Secondary | ICD-10-CM

## 2023-05-19 DIAGNOSIS — G4733 Obstructive sleep apnea (adult) (pediatric): Secondary | ICD-10-CM | POA: Diagnosis not present

## 2023-05-28 ENCOUNTER — Encounter: Payer: Self-pay | Admitting: Family Medicine

## 2023-05-28 ENCOUNTER — Ambulatory Visit (INDEPENDENT_AMBULATORY_CARE_PROVIDER_SITE_OTHER): Payer: Medicare PPO | Admitting: Family Medicine

## 2023-05-28 VITALS — BP 130/80 | HR 100 | Temp 97.2°F | Ht 64.0 in | Wt 279.0 lb

## 2023-05-28 DIAGNOSIS — J3089 Other allergic rhinitis: Secondary | ICD-10-CM

## 2023-05-28 DIAGNOSIS — Z86711 Personal history of pulmonary embolism: Secondary | ICD-10-CM

## 2023-05-28 DIAGNOSIS — G4733 Obstructive sleep apnea (adult) (pediatric): Secondary | ICD-10-CM | POA: Diagnosis not present

## 2023-05-28 DIAGNOSIS — R918 Other nonspecific abnormal finding of lung field: Secondary | ICD-10-CM

## 2023-05-28 DIAGNOSIS — E782 Mixed hyperlipidemia: Secondary | ICD-10-CM | POA: Diagnosis not present

## 2023-05-28 DIAGNOSIS — Z862 Personal history of diseases of the blood and blood-forming organs and certain disorders involving the immune mechanism: Secondary | ICD-10-CM | POA: Diagnosis not present

## 2023-05-28 DIAGNOSIS — J410 Simple chronic bronchitis: Secondary | ICD-10-CM

## 2023-05-28 DIAGNOSIS — K219 Gastro-esophageal reflux disease without esophagitis: Secondary | ICD-10-CM | POA: Diagnosis not present

## 2023-05-28 DIAGNOSIS — I25118 Atherosclerotic heart disease of native coronary artery with other forms of angina pectoris: Secondary | ICD-10-CM | POA: Diagnosis not present

## 2023-05-28 DIAGNOSIS — I119 Hypertensive heart disease without heart failure: Secondary | ICD-10-CM

## 2023-05-28 DIAGNOSIS — E538 Deficiency of other specified B group vitamins: Secondary | ICD-10-CM

## 2023-05-28 NOTE — Progress Notes (Signed)
 Subjective:  Patient ID: Scott Foley, male    DOB: September 03, 1965  Age: 58 y.o. MRN: [id number]  Chief Complaint  Patient presents with   Medical Management of Chronic Issues   Discussed the use of AI scribe software for clinical note transcription with the patient, who gave verbal consent to proceed.  History of Present Illness   Scott Foley is a 58 year old male with hypertension and coronary artery disease who presents for routine follow-up.  He is on valsartan 80 mg once daily and metoprolol 50 mg twice daily for hypertension. There was a recent adjustment to his valsartan dosage. No dizziness or lightheadedness.  For coronary artery disease, he takes Plavix 75 mg daily, rosuvastatin 40 mg at bedtime, and Vascepa 2 grams twice daily. No chest pain, shortness of breath, or palpitations.  He is on Eliquis 5 mg twice daily due to a history of blood clots, likely related to atrial fibrillation. No recent episodes of palpitations or irregular heartbeats.  For respiratory issues, he uses Trelegy inhaler once daily and reports no need for frequent use of albuterol. He also uses a CPAP machine regularly, which he finds helpful.  For gastroesophageal reflux disease, he takes pantoprazole 40 mg twice daily. Attempts to reduce the dosage have led to worsening symptoms.  He has a history of B12 deficiency and takes an over-the-counter B12 supplement. He eats healthy and engages in regular walking for exercise.  He reports significant allergy symptoms, including itchy eyes and frequent sneezing, but is not currently taking any allergy medication.  He underwent a CT scan at [facility] due to bilateral lung nodules, with a follow-up recommended in six months.  He has a history of smoking but quit less than 15 years ago. He is due for a tetanus vaccine and lung cancer screening.     Hypertension with stage 3B CKD: Patient is taking valsartan 80 mg daily, Metoprolol 50 mg  twice a day. Saw Dr. Allena Katz.     CAD: He is on Plavix 75 mg daily, metoprolol 50 mg twice daily, rosuvastatin 40 mg daily at bedtime, and repatha 140 mg IM every 2 weeks.   History of PE/DVT. Numerous. On eliquis 5 mg twice daily.    Hyperlipidemia: Rosuvastatin 40 mg daily at bedtime, Repatha 140 mg every 14 days, Fish Oil 1000 mg twice a day.   COPD:  Trelegy 1 puff daily, Albuterol 108 mcg inhaler 2 puff every 6 hours PRN.   GERD: Pantoprazole 40 mg twice daily.    Patient is eating healthy and some exercising.    OSA: on CPAP. He can tell an improvement in sleep and energy. Is compliant with cpap.    B12 deficiency : on B12 1000 mcg once daily       03/30/2023    1:07 PM 09/27/2022    2:09 PM 08/04/2022    9:10 AM 02/24/2022    2:08 PM 09/12/2021    2:02 PM  Depression screen PHQ 2/9  Decreased Interest 0 0 0 0 0  Down, Depressed, Hopeless 0 0 0 0 0  PHQ - 2 Score 0 0 0 0 0  Altered sleeping  0 0    Tired, decreased energy  2 2    Change in appetite  0 0    Feeling bad or failure about yourself   0 0    Trouble concentrating  0 0    Moving slowly or fidgety/restless  0 0    Suicidal  thoughts  0 0    PHQ-9 Score  2 2    Difficult doing work/chores  Not difficult at all Not difficult at all          09/25/2022    1:46 PM  Fall Risk   Falls in the past year? 0  Number falls in past yr: 0  Injury with Fall? 0  Risk for fall due to : No Fall Risks  Follow up Falls evaluation completed    Patient Care Team: Blane Ohara, MD as PCP - General (Family Medicine) Croitoru, Rachelle Hora, MD as PCP - Cardiology (Cardiology) Dagoberto Ligas, MD as Consulting Physician (Nephrology) Ricky Stabs, RN as VBCI Care Management (General Practice)   Review of Systems  Constitutional:  Negative for chills, diaphoresis, fatigue and fever.  HENT:  Negative for congestion, ear pain and sore throat.   Respiratory:  Negative for cough and shortness of breath.   Cardiovascular:  Negative for  chest pain and leg swelling.  Gastrointestinal:  Negative for abdominal pain, constipation, diarrhea, nausea and vomiting.  Genitourinary:  Negative for dysuria and urgency.  Musculoskeletal:  Negative for arthralgias and myalgias.  Neurological:  Negative for dizziness and headaches.  Psychiatric/Behavioral:  Negative for dysphoric mood.     Current Outpatient Medications on File Prior to Visit  Medication Sig Dispense Refill   albuterol (VENTOLIN HFA) 108 (90 Base) MCG/ACT inhaler INHALE 2 PUFFS BY MOUTH INTO LUNGS EVERY 6 HOURS AS NEEDED FOR SHORTNESS OF BREATH OR WHEEZING 8.5 g 0   clopidogrel (PLAVIX) 75 MG tablet TAKE ONE TABLET BY MOUTH EVERY MORNING 180 tablet 0   cyanocobalamin (VITAMIN B12) 1000 MCG tablet TAKE ONE TABLET BY MOUTH EVERY MORNING 90 tablet 3   ELIQUIS 5 MG TABS tablet TAKE ONE TABLET BY MOUTH TWICE DAILY 360 tablet 0   icosapent Ethyl (VASCEPA) 1 g capsule Take 2 capsules (2 g total) by mouth 2 (two) times daily. 120 capsule 2   metoprolol tartrate (LOPRESSOR) 50 MG tablet TAKE ONE TABLET BY MOUTH TWICE DAILY 240 tablet 0   Multiple Vitamin (MULTIVITAMIN WITH MINERALS) TABS tablet Take 1 tablet by mouth daily.     Omega-3 Fatty Acids (FISH OIL) 1000 MG CAPS Take 1 capsule (1,000 mg total) by mouth 2 (two) times daily. 180 capsule 0   ondansetron (ZOFRAN) 4 MG tablet Take 1 tablet (4 mg total) by mouth every 8 (eight) hours as needed for nausea or vomiting. 20 tablet 0   pantoprazole (PROTONIX) 40 MG tablet TAKE ONE TABLET BY MOUTH EVERY MORNING AND TAKE ONE TABLET BY MOUTH EVERY EVENING 240 tablet 0   QC VITAMIN B12 1000 MCG TBCR TAKE ONE TABLET BY MOUTH EVERY MORNING 120 tablet 0   REPATHA SURECLICK 140 MG/ML SOAJ INJECT 140MG  ( ) INTO THE SKIN EVERY 14 DAYS 2 mL 0   rosuvastatin (CRESTOR) 40 MG tablet TAKE ONE TABLET BY MOUTH EVERY EVENING 120 tablet 0   TRELEGY ELLIPTA 100-62.5-25 MCG/ACT AEPB INHALE 1 PUFF BY MOUTH INTO LUNGS DAILY. RINSE MOUTH AFTER USE 420 each  0   valsartan (DIOVAN) 80 MG tablet Take 1 tablet (80 mg total) by mouth daily. 90 tablet 3   valsartan (DIOVAN) 80 MG tablet Take 1 tablet (80 mg total) by mouth daily. 30 tablet 0   No current facility-administered medications on file prior to visit.   Past Medical History:  Diagnosis Date   Acquired thrombophilia (HCC) 12/06/2020   B12 deficiency 07/04/2021   Chronic obstructive pulmonary  disease (HCC) 07/16/2018   Class 2 severe obesity due to excess calories with serious comorbidity and body mass index (BMI) of 37.0 to 37.9 in adult (HCC) 12/06/2020   BMI 35 with serious comorbidities including CAD, HTN, STROKE, Hyperlipidemia.   Colon polyp 10/25/2020   serrated adenoma.   Coronary artery disease due to lipid rich plaque 01/01/2021   Coronary artery disease with stable angina pectoris [facility])    AMI No heart cath, treated medically [facility]   Daytime somnolence 08/04/2022   Diverticulosis of colon 10/25/2020   Noted on colnoscopy by Dr Tobi Bastos in [place].   DNR (do not resuscitate) 03/03/2019   History of blood clotting disorder 06/11/2021   History of CVA (cerebrovascular accident) 07/16/2018   Formatting of this note might be different from the original. CVA 3 years ago left with short term memory deficits. Has been on Plavix since.   History of pulmonary embolism 02/15/2021   Hyperlipemia    Hypertensive heart disease without congestive heart failure 12/06/2020   Hypoglycemia 04/15/2021   Hypokalemia 04/13/2021   Hypomagnesemia 06/11/2021   Impingement of left shoulder 04/05/2022   Intra-abdominal fluid collection 07/18/2021   Medication monitoring encounter 06/24/2021   Mixed hyperlipidemia 07/22/2018   Morbid obesity (HCC) 12/06/2020   BMI 35 with serious comorbidities including CAD, HTN, STROKE, Hyperlipidemia.   Normocytic anemia 04/15/2021   Other fatigue 12/06/2020   Pericardial effusion 04/29/2021   Protein-calorie malnutrition, severe 04/15/2021   Pulmonary  nodules/lesions, multiple 11/25/2012   9/15/2014CT Chest : small nodule RUL LUL. Stable since 03/2012 Smoking cessation advised and Rx chantix Repeat CT Chest 4 /21/15: subpleural lymph nodes, likely benign repeat one year 2016>>>no change, considered benign Cleda Daub 11/25/2012  NORMAL   [place], post-stroke 07/28/2019   Sleep apnea    CPAP   VRE (vancomycin-resistant Enterococci) infection 05/11/2021   Past Surgical History:  Procedure Laterality Date   COLONOSCOPY WITH PROPOFOL N/A 10/25/2020   Wyline Mood, MD, at [facility]. 25 MM serrated adenoma polyp at ascending colon removed and site clipped.  Pan diverticulosis.   IR CATHETER TUBE CHANGE  09/22/2021   IR CATHETER TUBE CHANGE  10/10/2021   IR RADIOLOGIST EVAL & MGMT  05/25/2021   IR RADIOLOGIST EVAL & MGMT  08/09/2021   IR RADIOLOGIST EVAL & MGMT  08/23/2021   IR RADIOLOGIST EVAL & MGMT  09/21/2021   IR RADIOLOGIST EVAL & MGMT  10/07/2021   LAPAROSCOPIC CHOLECYSTECTOMY  02/2021   PERICARDIOCENTESIS N/A 05/01/2021   Procedure: PERICARDIOCENTESIS;  Surgeon: [place], Peter M, MD;  Location: [facility] INVASIVE CV LAB;  Service: Cardiovascular;  Laterality: N/A;   TONSILLECTOMY      Family History  Problem Relation Age of Onset   Pulmonary embolism Mother    Hypertension Father    Heart attack Father    Stroke Father    Prostate cancer Father    Lung disease Father    Cancer Sister 38       lung cancer   Hypertension Sister    Cancer Sister 110       lung cancer   Lung cancer Sister    Cancer Brother        lung   Hypertension Brother    Heart disease Brother    Cancer Brother 62       throat and lung cancer   Heart attack Brother    Cancer - Lung Brother    Brain cancer Brother    Social History   Socioeconomic History  Marital status: Married    Spouse name: Dawn   Number of children: 2   Years of education: Not on file   Highest education level: GED or equivalent  Occupational History   Not on file  Tobacco Use   Smoking  status: Former    Current packs/day: 0.00    Average packs/day: 0.8 packs/day for 41.7 years (31.2 ttl pk-yrs)    Types: Cigarettes    Start date: 03/14/1979    Quit date: 11/2020    Years since quitting: 2.5   Smokeless tobacco: Never  Vaping Use   Vaping status: Former   Start date: 07/28/2018  Substance and Sexual Activity   Alcohol use: No   Drug use: No   Sexual activity: Yes    Partners: Female  Other Topics Concern   Not on file  Social History Narrative   Not on file   Social Drivers of Health   Financial Resource Strain: Low Risk  (05/07/2023)   Overall Financial Resource Strain (CARDIA)    Difficulty of Paying Living Expenses: Not hard at all  Food Insecurity: No Food Insecurity (05/07/2023)   Hunger Vital Sign    Worried About Running Out of Food in the Last Year: Never true    Ran Out of Food in the Last Year: Never true  Transportation Needs: No Transportation Needs (05/07/2023)   PRAPARE - Administrator, Civil Service (Medical): No    Lack of Transportation (Non-Medical): No  Physical Activity: Insufficiently Active (05/07/2023)   Exercise Vital Sign    Days of Exercise per Week: 4 days    Minutes of Exercise per Session: 30 min  Stress: No Stress Concern Present (05/07/2023)   [employer] of Occupational Health - Occupational Stress Questionnaire    Feeling of Stress : Not at all  Social Connections: Moderately Integrated (05/07/2023)   Social Connection and Isolation Panel [NHANES]    Frequency of Communication with Friends and Family: More than three times a week    Frequency of Social Gatherings with Friends and Family: More than three times a week    Attends Religious Services: More than 4 times per year    Active Member of [employer] or Organizations: No    Attends Banker Meetings: Never    Marital Status: Married  Recent Concern: Social Connections - Moderately Isolated (03/30/2023)   Social Connection and Isolation Panel  [NHANES]    Frequency of Communication with Friends and Family: More than three times a week    Frequency of Social Gatherings with Friends and Family: Once a week    Attends Religious Services: Never    Diplomatic Services operational officer: No    Attends Engineer, structural: Never    Marital Status: Married    Objective:  BP 130/80   Pulse 100   Temp (!) 97.2 F (36.2 C)   Ht 5\' 4"  (1.626 m)   Wt 279 lb (126.6 kg)   SpO2 97%   BMI 47.89 kg/m      05/28/2023    9:03 AM 05/08/2023   11:12 AM 02/27/2023    1:59 PM  BP/Weight  Systolic BP 130 142 108  Diastolic BP 80 92 70  Wt. (Lbs) 279 239 238  BMI 47.89 kg/m2 37.43 kg/m2 37.28 kg/m2    Physical Exam Vitals reviewed.  Constitutional:      Appearance: Normal appearance. He is obese.  Cardiovascular:     Rate and Rhythm: Normal rate  and regular rhythm.     Heart sounds: No murmur heard. Pulmonary:     Effort: Pulmonary effort is normal.     Breath sounds: Normal breath sounds.  Abdominal:     General: Abdomen is flat. Bowel sounds are normal.     Palpations: Abdomen is soft.     Tenderness: There is no abdominal tenderness.  Neurological:     Mental Status: He is alert and oriented to person, place, and time.  Psychiatric:        Mood and Affect: Mood normal.        Behavior: Behavior normal.     Diabetic Foot Exam - Simple   No data filed      Lab Results  Component Value Date   WBC 10.0 05/28/2023   HGB 16.6 05/28/2023   HCT 50.0 05/28/2023   PLT 315 05/28/2023   GLUCOSE 94 05/28/2023   CHOL 53 (L) 05/28/2023   TRIG 98 05/28/2023   HDL 30 (L) 05/28/2023   LDLCALC 4 05/28/2023   ALT 25 05/28/2023   AST 15 05/28/2023   NA 139 05/28/2023   K 5.0 05/28/2023   CL 102 05/28/2023   CREATININE 1.36 (H) 05/28/2023   BUN 20 05/28/2023   CO2 20 05/28/2023   TSH 3.170 04/05/2022   INR 1.2 07/21/2021      Assessment & Plan:   Mixed hyperlipidemia Assessment & Plan: At goal.   Recommend continue to work on eating healthy diet and exercise. Continue Rosuvastatin 40 mg daily at bedtime, Repatha 140 mg every 14 days, Fish Oil 1000 mg twice a day.  Orders: -     Comprehensive metabolic panel -     Lipid panel  Hypertensive heart disease without congestive heart failure Assessment & Plan: The current medical regimen is effective;  continue present plan and medications. He is on valsartan 80 mg once daily and metoprolol 50 mg twice daily for hypertension management.   - Continue valsartan 80 mg once daily   - Continue metoprolol 50 mg twice daily   Orders: -     CBC with Differential/Platelet  Coronary artery disease of native artery of native heart with stable angina pectoris [facility]) Assessment & Plan: He is on Plavix, rosuvastatin, and Vascepa to reduce cardiovascular risk and manage cholesterol levels.   - Continue Plavix 75 mg daily   - Continue rosuvastatin 40 mg at bedtime   - Continue Vascepa 2 grams twice daily  - Continue repatha 140 mg IM every 14 days.    History of pulmonary embolism Assessment & Plan: He is on Eliquis 5 mg twice daily to prevent further thromboembolic events.   - Continue Eliquis 5 mg twice daily   Simple chronic bronchitis (HCC) Assessment & Plan: He uses Trelegy inhaler once daily and reports no frequent use of albuterol, indicating well-managed COPD.   - Continue Trelegy inhaler one puff daily    Gastroesophageal reflux disease without esophagitis Assessment & Plan: He is on pantoprazole 40 mg twice daily. Attempts to reduce the dosage have led to worsening reflux symptoms, necessitating continued twice-daily dosing.   - Continue pantoprazole 40 mg twice daily   History of blood clotting disorder Assessment & Plan: He is on Eliquis 5 mg twice daily to prevent further thromboembolic events.   - Continue Eliquis 5 mg twice daily     Obstructive sleep apnea Assessment & Plan: He uses a CPAP machine regularly,  effectively managing obstructive sleep apnea symptoms.   - Continue  using CPAP machine nightly    Pulmonary nodules/lesions, multiple Assessment & Plan: He has bilateral lung nodules with a follow-up CT scan scheduled for June 2025 to monitor them.   - Schedule follow-up CT scan for June 2025    B12 deficiency Assessment & Plan: He takes an over-the-counter B12 supplement for B12 deficiency.   - Continue over-the-counter B12 supplementation   Seasonal allergic rhinitis due to other allergic trigger Assessment & Plan: He reports symptoms of allergies, including itchy eyes and frequent sneezing, and will trial allergy medication for symptom relief.   - Provide allergy medication samples for trial    Recommend try otc antihistamines: claritin, allegra or zyrtec.      General Health Maintenance   He is due for a tetanus vaccine and requires annual lung cancer screening due to a history of smoking, having quit less than 15 years ago.   - Recommend tetanus vaccine at the pharmacy   - Recommend annual lung cancer screening until 15 years after quitting smoking   No orders of the defined types were placed in this encounter.   Orders Placed This Encounter  Procedures   CBC with Differential/Platelet   Comprehensive metabolic panel   Lipid panel     Follow-up: Return in about 3 months (around 08/28/2023) for chronic follow up.   I,Katherina A Bramblett,acting as a scribe for Blane Ohara, MD.,have documented all relevant documentation on the behalf of Blane Ohara, MD,as directed by  Blane Ohara, MD while in the presence of Blane Ohara, MD.   Clayborn Bigness I Leal-Borjas,acting as a scribe for Blane Ohara, MD.,have documented all relevant documentation on the behalf of Blane Ohara, MD,as directed by  Blane Ohara, MD while in the presence of Blane Ohara, MD.    An After Visit Summary was printed and given to the patient.  I attest that I have reviewed this visit and agree with the plan  scribed by my staff.   Blane Ohara, MD [facility] [phone]

## 2023-05-28 NOTE — Patient Instructions (Signed)
 Recommend try otc antihistamines: claritin, allegra or zyrtec.

## 2023-05-29 LAB — COMPREHENSIVE METABOLIC PANEL
ALT: 25 IU/L (ref 0–44)
AST: 15 IU/L (ref 0–40)
Albumin: 4.4 g/dL (ref 3.8–4.9)
Alkaline Phosphatase: 101 IU/L (ref 44–121)
BUN/Creatinine Ratio: 15 (ref 9–20)
BUN: 20 mg/dL (ref 6–24)
Bilirubin Total: 0.4 mg/dL (ref 0.0–1.2)
CO2: 20 mmol/L (ref 20–29)
Calcium: 9.5 mg/dL (ref 8.7–10.2)
Chloride: 102 mmol/L (ref 96–106)
Creatinine, Ser: 1.36 mg/dL — ABNORMAL HIGH (ref 0.76–1.27)
Globulin, Total: 2.7 g/dL (ref 1.5–4.5)
Glucose: 94 mg/dL (ref 70–99)
Potassium: 5 mmol/L (ref 3.5–5.2)
Sodium: 139 mmol/L (ref 134–144)
Total Protein: 7.1 g/dL (ref 6.0–8.5)
eGFR: 60 mL/min/{1.73_m2} (ref >59)

## 2023-05-29 LAB — CBC WITH DIFFERENTIAL/PLATELET
Basophils Absolute: 0.1 10*3/uL (ref 0.0–0.2)
Basos: 1 %
EOS (ABSOLUTE): 0.1 10*3/uL (ref 0.0–0.4)
Eos: 1 %
Hematocrit: 50 % (ref 37.5–51.0)
Hemoglobin: 16.6 g/dL (ref 13.0–17.7)
Immature Grans (Abs): 0 10*3/uL (ref 0.0–0.1)
Immature Granulocytes: 0 %
Lymphocytes Absolute: 2.4 10*3/uL (ref 0.7–3.1)
Lymphs: 24 %
MCH: 29.2 pg (ref 26.6–33.0)
MCHC: 33.2 g/dL (ref 31.5–35.7)
MCV: 88 fL (ref 79–97)
Monocytes Absolute: 0.9 10*3/uL (ref 0.1–0.9)
Monocytes: 9 %
Neutrophils Absolute: 6.4 10*3/uL (ref 1.4–7.0)
Neutrophils: 65 %
Platelets: 315 10*3/uL (ref 150–450)
RBC: 5.68 x10E6/uL (ref 4.14–5.80)
RDW: 14.6 % (ref 11.6–15.4)
WBC: 10 10*3/uL (ref 3.4–10.8)

## 2023-05-29 LAB — LIPID PANEL
Chol/HDL Ratio: 1.8 ratio (ref 0.0–5.0)
Cholesterol, Total: 53 mg/dL — ABNORMAL LOW (ref 100–199)
HDL: 30 mg/dL — ABNORMAL LOW (ref >39)
LDL Chol Calc (NIH): 4 mg/dL (ref 0–99)
Triglycerides: 98 mg/dL (ref 0–149)
VLDL Cholesterol Cal: 19 mg/dL (ref 5–40)

## 2023-05-31 ENCOUNTER — Encounter: Payer: Self-pay | Admitting: Family Medicine

## 2023-06-02 DIAGNOSIS — G4733 Obstructive sleep apnea (adult) (pediatric): Secondary | ICD-10-CM | POA: Insufficient documentation

## 2023-06-02 DIAGNOSIS — J309 Allergic rhinitis, unspecified: Secondary | ICD-10-CM | POA: Insufficient documentation

## 2023-06-02 DIAGNOSIS — K219 Gastro-esophageal reflux disease without esophagitis: Secondary | ICD-10-CM | POA: Insufficient documentation

## 2023-06-02 HISTORY — DX: Gastro-esophageal reflux disease without esophagitis: K21.9

## 2023-06-02 HISTORY — DX: Allergic rhinitis, unspecified: J30.9

## 2023-06-02 HISTORY — DX: Obstructive sleep apnea (adult) (pediatric): G47.33

## 2023-06-02 NOTE — Assessment & Plan Note (Addendum)
 He is on Plavix, rosuvastatin, and Vascepa to reduce cardiovascular risk and manage cholesterol levels.   - Continue Plavix 75 mg daily   - Continue rosuvastatin 40 mg at bedtime   - Continue Vascepa 2 grams twice daily  - Continue repatha 140 mg IM every 14 days.

## 2023-06-02 NOTE — Assessment & Plan Note (Signed)
 He uses a CPAP machine regularly, effectively managing obstructive sleep apnea symptoms.   - Continue using CPAP machine nightly

## 2023-06-02 NOTE — Assessment & Plan Note (Signed)
 He is on Eliquis 5 mg twice daily to prevent further thromboembolic events.   - Continue Eliquis 5 mg twice daily

## 2023-06-02 NOTE — Assessment & Plan Note (Signed)
 At goal.  Recommend continue to work on eating healthy diet and exercise. Continue Rosuvastatin 40 mg daily at bedtime, Repatha 140 mg every 14 days, Fish Oil 1000 mg twice a day.

## 2023-06-02 NOTE — Assessment & Plan Note (Signed)
 He is on pantoprazole 40 mg twice daily. Attempts to reduce the dosage have led to worsening reflux symptoms, necessitating continued twice-daily dosing.   - Continue pantoprazole 40 mg twice daily

## 2023-06-02 NOTE — Assessment & Plan Note (Addendum)
 The current medical regimen is effective;  continue present plan and medications. He is on valsartan 80 mg once daily and metoprolol 50 mg twice daily for hypertension management.   - Continue valsartan 80 mg once daily   - Continue metoprolol 50 mg twice daily

## 2023-06-02 NOTE — Assessment & Plan Note (Signed)
 He has bilateral lung nodules with a follow-up CT scan scheduled for June 2025 to monitor them.   - Schedule follow-up CT scan for June 2025

## 2023-06-02 NOTE — Assessment & Plan Note (Signed)
 He uses Trelegy inhaler once daily and reports no frequent use of albuterol, indicating well-managed COPD.   - Continue Trelegy inhaler one puff daily

## 2023-06-02 NOTE — Assessment & Plan Note (Addendum)
 He reports symptoms of allergies, including itchy eyes and frequent sneezing, and will trial allergy medication for symptom relief.   - Provide allergy medication samples for trial    Recommend try otc antihistamines: claritin, allegra or zyrtec.

## 2023-06-02 NOTE — Assessment & Plan Note (Signed)
 He takes an over-the-counter B12 supplement for B12 deficiency.   - Continue over-the-counter B12 supplementation

## 2023-06-02 NOTE — Assessment & Plan Note (Signed)
>>  ASSESSMENT AND PLAN FOR HYPERLIPEMIA WRITTEN ON 06/02/2023  7:08 PM BY [name], [name], MD  At goal.  Recommend continue to work on eating healthy diet and exercise. Continue Rosuvastatin  40 mg daily at bedtime, Repatha  140 mg every 14 days, Fish Oil  1000 mg twice a day.

## 2023-06-11 ENCOUNTER — Ambulatory Visit: Payer: Medicare PPO | Admitting: Family Medicine

## 2023-06-14 ENCOUNTER — Other Ambulatory Visit: Payer: Self-pay | Admitting: Physician Assistant

## 2023-06-14 ENCOUNTER — Other Ambulatory Visit: Payer: Self-pay | Admitting: Family Medicine

## 2023-06-14 DIAGNOSIS — Z9981 Dependence on supplemental oxygen: Secondary | ICD-10-CM

## 2023-06-14 DIAGNOSIS — I119 Hypertensive heart disease without heart failure: Secondary | ICD-10-CM

## 2023-06-14 DIAGNOSIS — J9611 Chronic respiratory failure with hypoxia: Secondary | ICD-10-CM

## 2023-06-14 DIAGNOSIS — J449 Chronic obstructive pulmonary disease, unspecified: Secondary | ICD-10-CM

## 2023-06-19 DIAGNOSIS — G4733 Obstructive sleep apnea (adult) (pediatric): Secondary | ICD-10-CM | POA: Diagnosis not present

## 2023-06-21 IMAGING — CR DG CHEST 2V
1 series · 2 of 2 positions shown · non-contrast
Comparison: Prior radiograph from 11/25/2020.

CLINICAL DATA: Initial evaluation for acute chest pain.

EXAM:
CHEST - 2 VIEW

[Series 1: dg chest 2 view · 0.14mm/px · 2 of 2 slices shown]
[im 1/2]
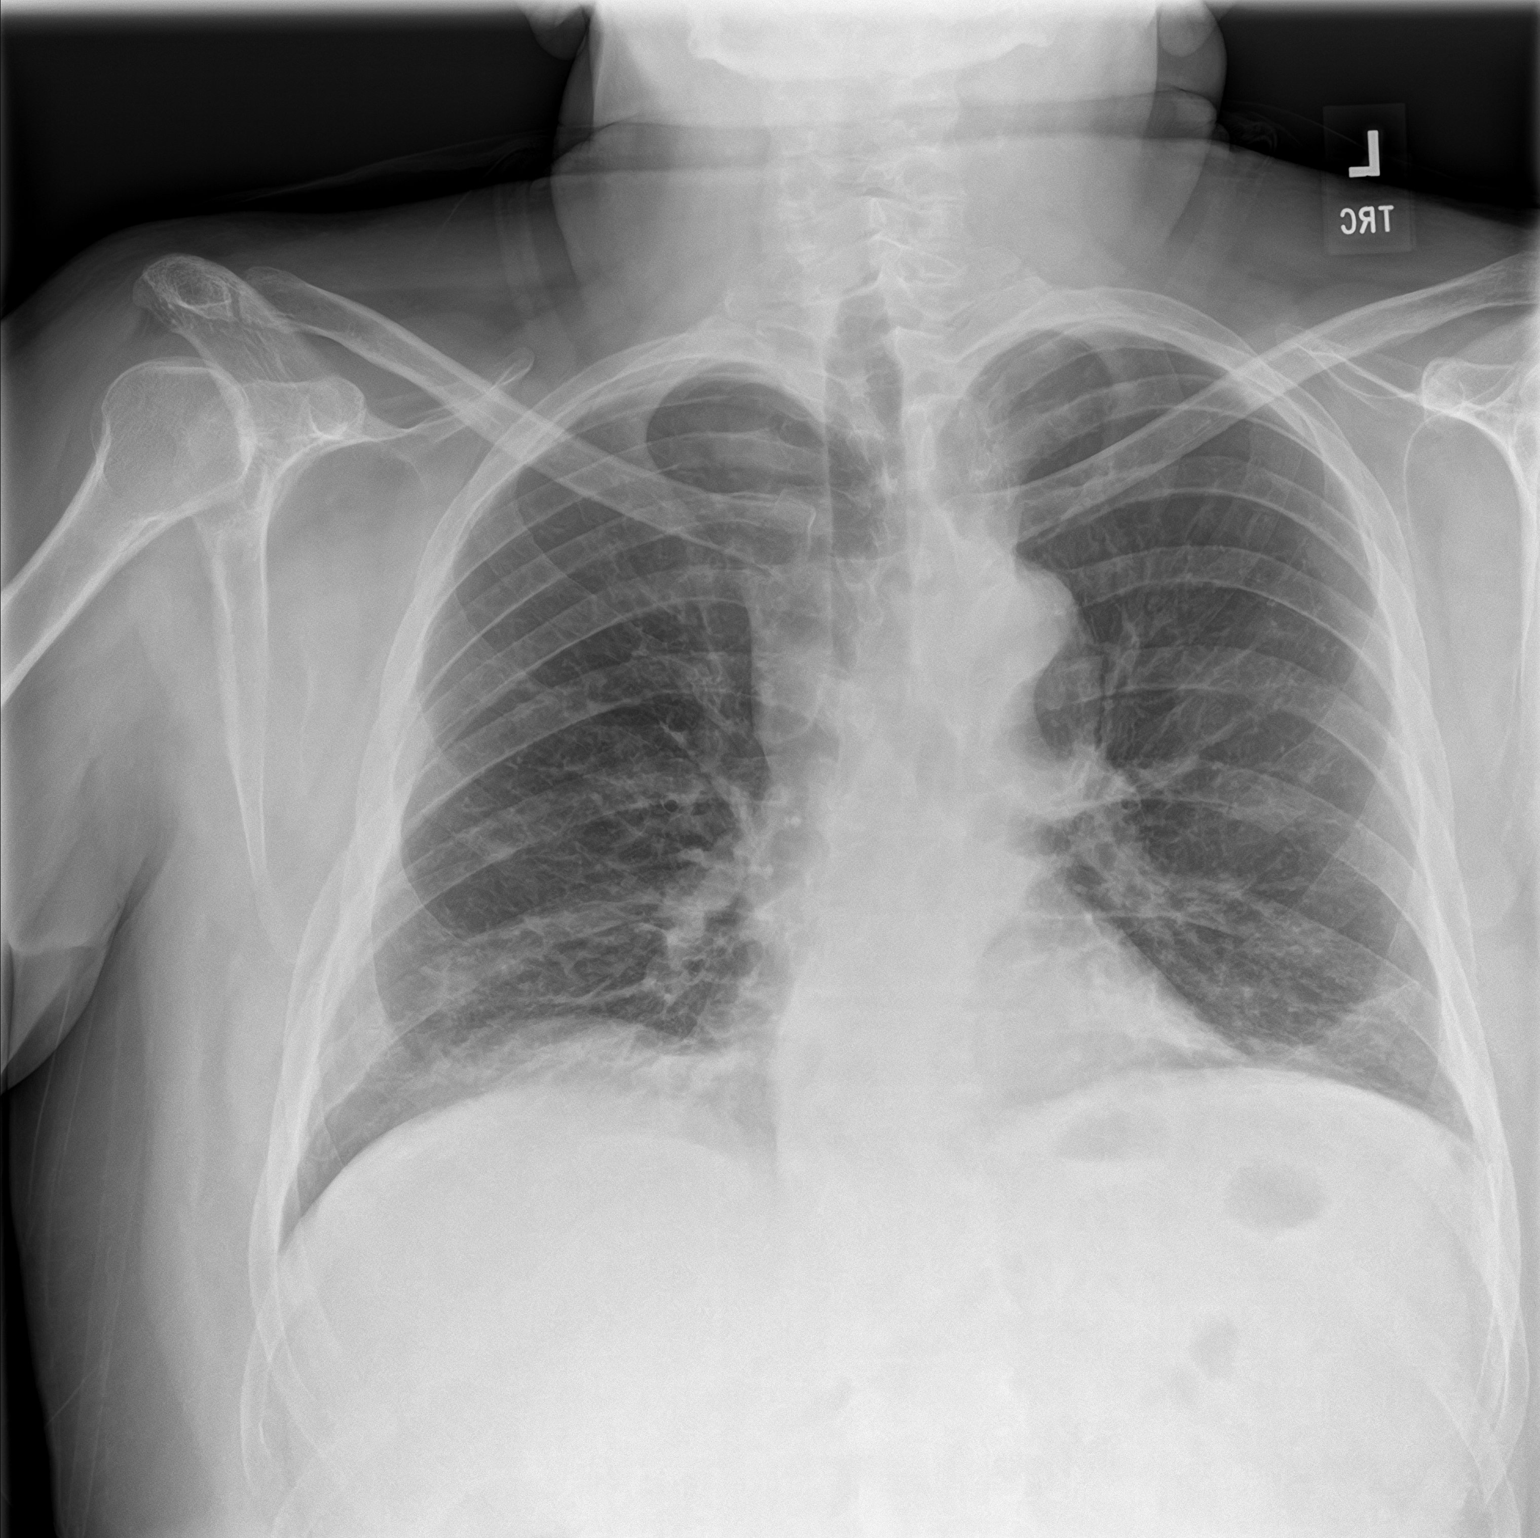
[im 2/2]
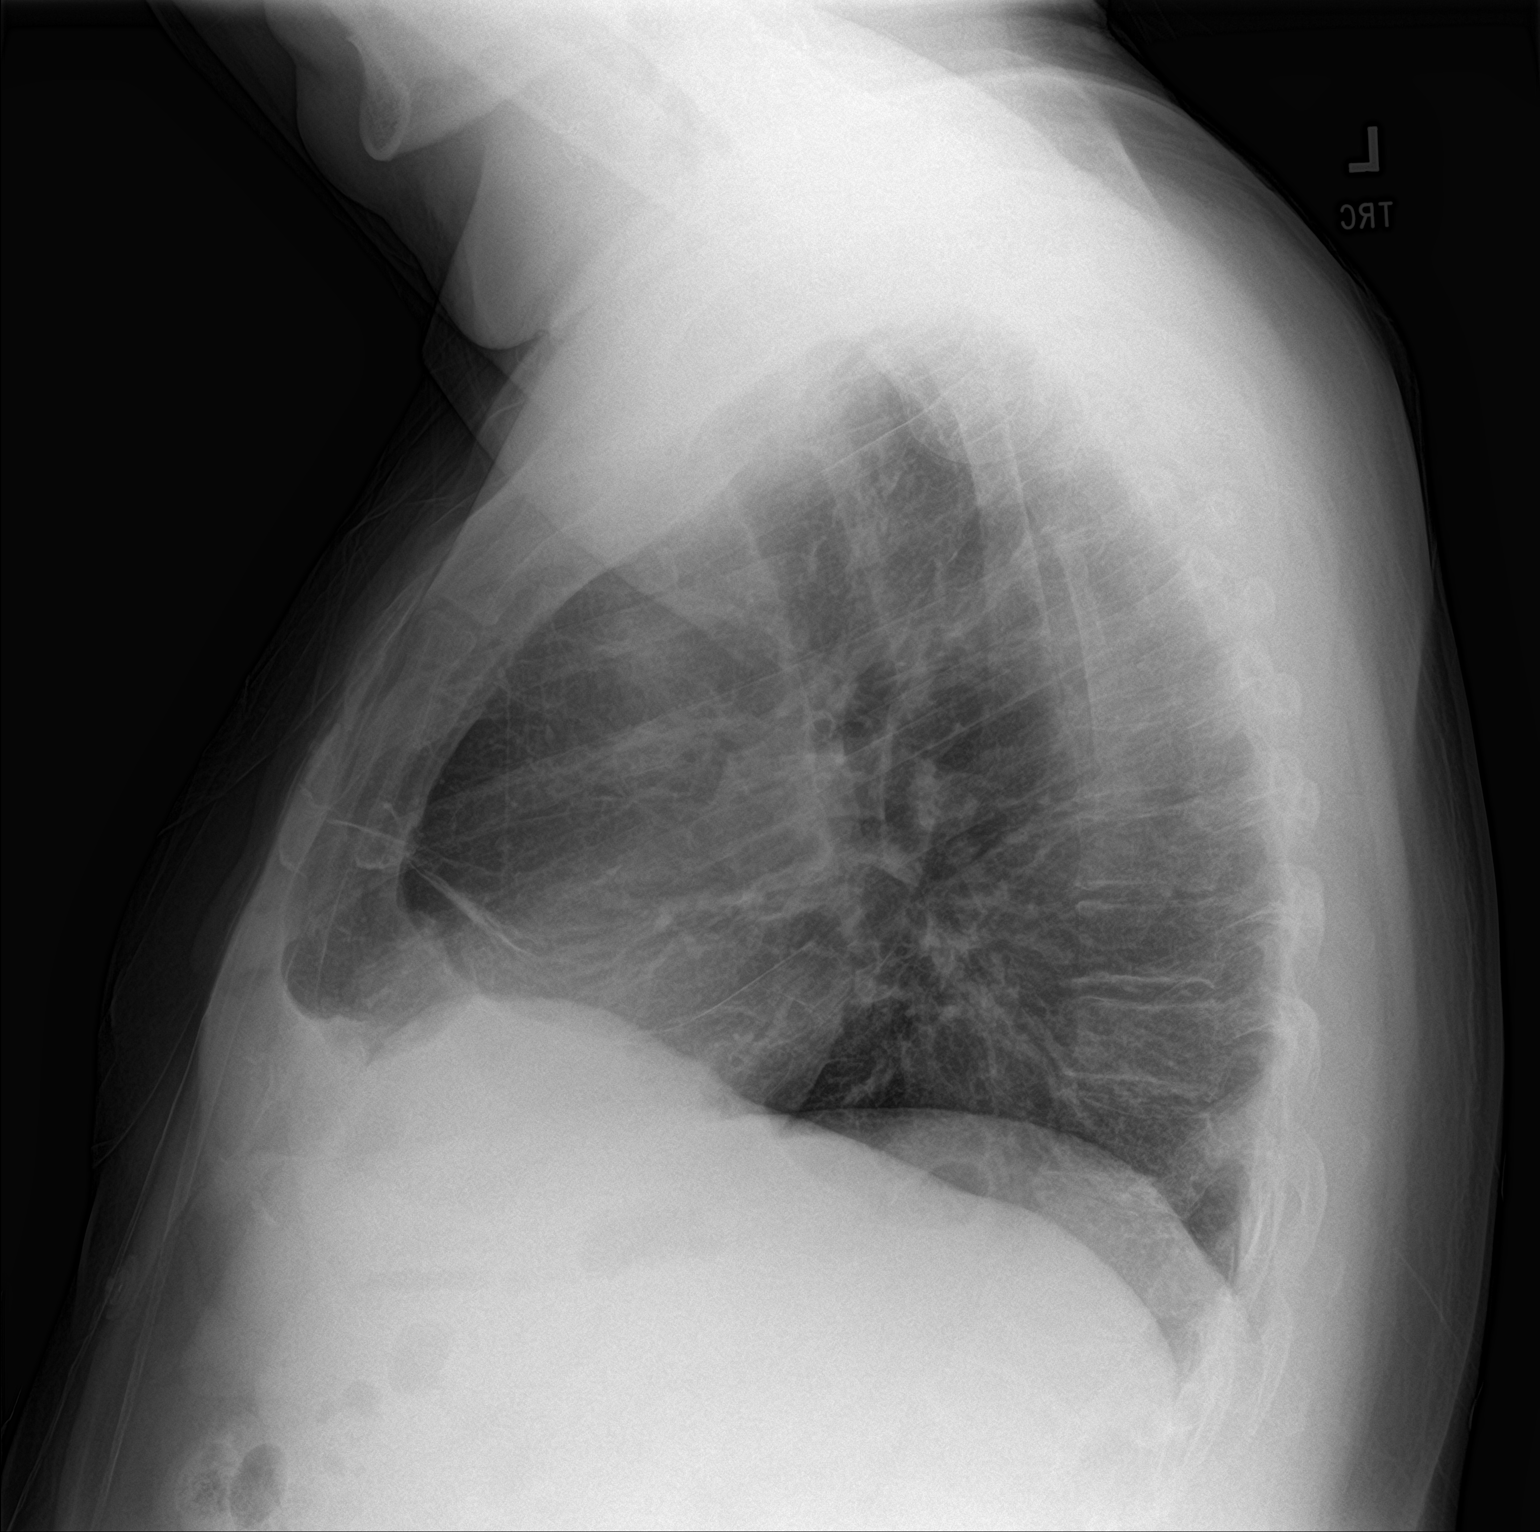

[2 of 2 positions shown; findings below may reference images not displayed]

FINDINGS: Cardiac and mediastinal silhouettes are stable in size and contour,
and remain within normal limits.

Lungs are hypoinflated. Scattered streaky and patchy bibasilar
opacities, right slightly worse than left, similar to previous, and
likely reflecting atelectasis and/or scarring. No frank airspace
consolidation. No pulmonary edema or pleural effusion. No
pneumothorax.

Remotely healed right-sided rib fractures noted. No acute osseous
finding.
IMPRESSION: 1. Shallow lung inflation with associated bibasilar atelectasis
and/or scarring, right slightly worse than left.
2. No other active cardiopulmonary disease.

## 2023-06-29 ENCOUNTER — Other Ambulatory Visit: Payer: Self-pay

## 2023-06-29 ENCOUNTER — Other Ambulatory Visit: Payer: Self-pay | Admitting: *Deleted

## 2023-06-29 ENCOUNTER — Encounter: Payer: Self-pay | Admitting: *Deleted

## 2023-06-29 NOTE — Patient Outreach (Signed)
 Complex Care Management   Visit Note  06/29/2023  Name:  Scott Foley MRN: [id number] DOB: Apr 10, 1965  Situation: Referral received for Complex Care Management related to  HTN, HLD  I obtained verbal consent from Patient.  Visit completed with patient  on the phone  Background:   Past Medical History:  Diagnosis Date   Acquired thrombophilia (HCC) 12/06/2020   B12 deficiency 07/04/2021   Chronic obstructive pulmonary disease (HCC) 07/16/2018   Class 2 severe obesity due to excess calories with serious comorbidity and body mass index (BMI) of 37.0 to 37.9 in adult (HCC) 12/06/2020   BMI 35 with serious comorbidities including CAD, HTN, STROKE, Hyperlipidemia.   Colon polyp 10/25/2020   serrated adenoma.   Coronary artery disease due to lipid rich plaque 01/01/2021   Coronary artery disease with stable angina pectoris [facility])    AMI No heart cath, treated medically [facility]   Daytime somnolence 08/04/2022   Diverticulosis of colon 10/25/2020   Noted on colnoscopy by Dr Therisa in [place].   DNR (do not resuscitate) 03/03/2019   History of blood clotting disorder 06/11/2021   History of CVA (cerebrovascular accident) 07/16/2018   Formatting of this note might be different from the original. CVA 3 years ago left with short term memory deficits. Has been on Plavix  since.   History of pulmonary embolism 02/15/2021   Hyperlipemia    Hypertensive heart disease without congestive heart failure 12/06/2020   Hypoglycemia 04/15/2021   Hypokalemia 04/13/2021   Hypomagnesemia 06/11/2021   Impingement of left shoulder 04/05/2022   Intra-abdominal fluid collection 07/18/2021   Medication monitoring encounter 06/24/2021   Mixed hyperlipidemia 07/22/2018   Morbid obesity (HCC) 12/06/2020   BMI 35 with serious comorbidities including CAD, HTN, STROKE, Hyperlipidemia.   Normocytic anemia 04/15/2021   Other fatigue 12/06/2020   Pericardial effusion 04/29/2021   Protein-calorie  malnutrition, severe 04/15/2021   Pulmonary nodules/lesions, multiple 11/25/2012   9/15/2014CT Chest : small nodule RUL LUL. Stable since 03/2012 Smoking cessation advised and Rx chantix  Repeat CT Chest 4 /21/15: subpleural lymph nodes, likely benign repeat one year 2016>>>no change, considered benign Spiro 11/25/2012  NORMAL   Sequela, post-stroke 07/28/2019   Sleep apnea    CPAP   VRE (vancomycin-resistant Enterococci) infection 05/11/2021    Assessment: Patient Reported Symptoms:  Cognitive Cognitive Status: Alert and oriented to person, place, and time Cognitive/Intellectual Conditions Management [RPT]: None reported or documented in medical history or problem list   Health Maintenance Behaviors: Annual physical exam Health Facilitated by: Rest  Neurological Neurological Review of Symptoms: No symptoms reported Neurological Self-Management Outcome: 4 (good)  HEENT HEENT Symptoms Reported: No symptoms reported HEENT Self-Management Outcome: 4 (good)    Cardiovascular Cardiovascular Symptoms Reported: No symptoms reported Does patient have uncontrolled Hypertension?: No Cardiovascular Conditions: Hypertension Cardiovascular Management Strategies: Medication therapy, Routine screening Cardiovascular Self-Management Outcome: 4 (good)  Respiratory Respiratory Symptoms Reported: No symptoms reported Respiratory Self-Management Outcome: 4 (good)  Endocrine Patient reports the following symptoms related to hypoglycemia or hyperglycemia : No symptoms reported Is patient diabetic?: No Is patient checking blood sugars at home?: No    Gastrointestinal Gastrointestinal Symptoms Reported: No symptoms reported Gastrointestinal Self-Management Outcome: 4 (good) Nutrition Risk Screen (CP): No indicators present  Genitourinary Genitourinary Symptoms Reported: No symptoms reported Genitourinary Self-Management Outcome: 4 (good)  Integumentary Integumentary Symptoms Reported: No symptoms  reported    Musculoskeletal Musculoskelatal Symptoms Reviewed: No symptoms reported Musculoskeletal Self-Management Outcome: 4 (good) Falls in the past year?: No Number of  falls in past year: 1 or less Was there an injury with Fall?: No Fall Risk Category Calculator: 0 Patient Fall Risk Level: Low Fall Risk Patient at Risk for Falls Due to: No Fall Risks Fall risk Follow up: Falls evaluation completed  Psychosocial Psychosocial Symptoms Reported: No symptoms reported Behavioral Health Self-Management Outcome: 4 (good) Major Change/Loss/Stressor/Fears (CP): Denies Techniques to Cope with Loss/Stress/Change: Not applicable Quality of Family Relationships: helpful, involved, stressful Do you feel physically threatened by others?: No      06/29/2023    3:05 PM  Depression screen PHQ 2/9  Decreased Interest 0  Down, Depressed, Hopeless 0  PHQ - 2 Score 0    Vitals:   06/29/23 1300  BP: 136/86    Medications Reviewed Today     Reviewed by Bertrum Rosina HERO, RN (Registered Nurse) on 06/29/23 at 1501  Med List Status: <None>   Medication Order Taking? Sig Documenting Provider Last Dose Status Informant  albuterol  (VENTOLIN  HFA) 108 (90 Base) MCG/ACT inhaler [id number] Yes INHALE 2 PUFFS BY MOUTH INTO LUNGS EVERY 6 HOURS AS NEEDED FOR SHORTNESS OF BREATH OR WHEEZING [name], [name], MD Taking Active   clopidogrel  (PLAVIX ) 75 MG tablet [id number] Yes TAKE ONE TABLET BY MOUTH EVERY MORNING [name], [name], MD Taking Active   cyanocobalamin  (VITAMIN B12) 1000 MCG tablet [id number] No TAKE ONE TABLET BY MOUTH EVERY MORNING  Patient not taking: Reported on 06/29/2023   Sherre Clapper, MD Not Taking Consider Medication Status and Discontinue   ELIQUIS  5 MG TABS tablet [id number] Yes TAKE ONE TABLET BY MOUTH TWICE DAILY [name], [name], MD Taking Active   icosapent  Ethyl (VASCEPA ) 1 g capsule [id number] No Take 2 capsules (2 g total) by mouth 2 (two) times daily.  Patient not taking: Reported on 06/29/2023    CoxClapper, MD Not Taking Consider Medication Status and Discontinue   metoprolol  tartrate (LOPRESSOR ) 50 MG tablet [id number] Yes TAKE ONE TABLET BY MOUTH TWICE DAILY [name], [name], MD Taking Active   Multiple Vitamin (MULTIVITAMIN WITH MINERALS) TABS tablet [id number] Yes Take 1 tablet by mouth daily. Cherlyn Labella, MD Taking Active Self  Omega-3 Fatty Acids (FISH OIL ) 1000 MG CAPS [id number]  Take 1 capsule (1,000 mg total) by mouth 2 (two) times daily. [name], [name], MD  Active Self  ondansetron  (ZOFRAN ) 4 MG tablet [id number] No Take 1 tablet (4 mg total) by mouth every 8 (eight) hours as needed for nausea or vomiting.  Patient not taking: Reported on 06/29/2023   CoxClapper, MD Not Taking Consider Medication Status and Discontinue   pantoprazole  (PROTONIX ) 40 MG tablet [id number] Yes TAKE ONE TABLET BY MOUTH EVERY MORNING AND TAKE ONE TABLET BY MOUTH EVERY KARNA Sherre Clapper, MD Taking Active   QC VITAMIN B12 1000 MCG TBCR [id number] Yes TAKE ONE TABLET BY MOUTH EVERY ERMALINDA Sherre Clapper, MD Taking Active   REPATHA  SURECLICK 140 MG/ML EMMANUEL [id number] Yes INJECT 140MG  ( ) INTO THE SKIN EVERY 14 DAYS [name], [name], MD Taking Active   rosuvastatin  (CRESTOR ) 40 MG tablet [id number] Yes TAKE ONE TABLET BY MOUTH EVERY KARNA Sherre Clapper, MD Taking Active   TRELEGY ELLIPTA  100-62.5-25 MCG/ACT AEPB [id number] Yes INHALE 1 PUFF BY MOUTH INTO LUNGS DAILY. RINSE MOUTH AFTER USE [name], [name], MD Taking Active   valsartan  (DIOVAN ) 80 MG tablet [id number] No Take 1 tablet (80 mg total) by mouth daily.  Patient not taking: Reported on 06/29/2023   Sherre Clapper, MD Not Taking Consider Medication Status and Discontinue  Med Note ([name], ABIGAIL [place] Nov 22, 2022  2:49 PM)    valsartan  (DIOVAN ) 80 MG tablet [id number] Yes TAKE ONE TABLET BY MOUTH EVERY DAY [name], [name], MD Taking Active             Recommendation:   N/A  Follow Up Plan:   Closing From:  Complex Care Management  Rosina Forte, BSN RN [facility]  [facility], [facility] RN Care Manager Direct Dial: [phone]  Fax: [phone]

## 2023-06-29 NOTE — Patient Instructions (Signed)
 Visit Information  Thank you for taking time to visit with me today. Please don't hesitate to contact me if I can be of assistance to you before our next scheduled appointment.  Your next care management appointment is no further scheduled appointments.   Closing From: Complex Care Management.  Please call the care guide team at [phone] if you need to cancel, schedule, or reschedule an appointment.   Please call the Suicide and Crisis Lifeline: 988 call the [place]  National Suicide Prevention Lifeline: 743 576 7883 or TTY: 619 493 3670 TTY (815) 065-4756) to talk to a trained counselor call 1-800-273-TALK (toll free, 24 hour hotline) call 911 if you are experiencing a Mental Health or Behavioral Health Crisis or need someone to talk to.  Grandville Lax, BSN RN [facility], [facility] RN Care Manager Direct Dial: [phone]  Fax: [phone]

## 2023-07-16 ENCOUNTER — Other Ambulatory Visit: Payer: Self-pay | Admitting: Family Medicine

## 2023-07-16 DIAGNOSIS — Z9981 Dependence on supplemental oxygen: Secondary | ICD-10-CM

## 2023-07-16 DIAGNOSIS — J449 Chronic obstructive pulmonary disease, unspecified: Secondary | ICD-10-CM

## 2023-07-16 DIAGNOSIS — I119 Hypertensive heart disease without heart failure: Secondary | ICD-10-CM

## 2023-07-16 DIAGNOSIS — J9611 Chronic respiratory failure with hypoxia: Secondary | ICD-10-CM

## 2023-07-19 DIAGNOSIS — G4733 Obstructive sleep apnea (adult) (pediatric): Secondary | ICD-10-CM | POA: Diagnosis not present

## 2023-07-25 DIAGNOSIS — G4733 Obstructive sleep apnea (adult) (pediatric): Secondary | ICD-10-CM | POA: Diagnosis not present

## 2023-08-14 ENCOUNTER — Other Ambulatory Visit: Payer: Self-pay | Admitting: Family Medicine

## 2023-08-14 DIAGNOSIS — Z9981 Dependence on supplemental oxygen: Secondary | ICD-10-CM

## 2023-08-14 DIAGNOSIS — J9611 Chronic respiratory failure with hypoxia: Secondary | ICD-10-CM

## 2023-08-14 DIAGNOSIS — I119 Hypertensive heart disease without heart failure: Secondary | ICD-10-CM

## 2023-08-14 DIAGNOSIS — J449 Chronic obstructive pulmonary disease, unspecified: Secondary | ICD-10-CM

## 2023-08-19 DIAGNOSIS — G4733 Obstructive sleep apnea (adult) (pediatric): Secondary | ICD-10-CM | POA: Diagnosis not present

## 2023-08-29 NOTE — Progress Notes (Unsigned)
 Subjective:  Patient ID: Scott Foley, male    DOB: 12-31-1965  Age: 58 y.o. MRN: [id number]  No chief complaint on file.   HPI:  Scott Foley is a 58 year old male with hypertension and coronary artery disease who presents for routine follow-up.  He is on valsartan  80 mg once daily and metoprolol  50 mg twice daily for hypertension.   For coronary artery disease, he takes Plavix  75 mg daily, rosuvastatin  40 mg at bedtime, and Vascepa  2 grams twice daily.   He is on Eliquis  5 mg twice daily due to a history of blood clots, likely related to atrial fibrillation.   For respiratory issues, he uses Trelegy inhaler once daily and reports no need for frequent use of albuterol . He also uses a CPAP machine regularly, which he finds helpful.  For gastroesophageal reflux disease, he takes pantoprazole  40 mg twice daily.   He has a history of B12 deficiency and takes an over-the-counter B12 supplement. He eats healthy and engages in regular walking for exercise.      06/29/2023    3:05 PM 03/30/2023    1:07 PM 09/27/2022    2:09 PM 08/04/2022    9:10 AM 02/24/2022    2:08 PM  Depression screen PHQ 2/9  Decreased Interest 0 0 0 0 0  Down, Depressed, Hopeless 0 0 0 0 0  PHQ - 2 Score 0 0 0 0 0  Altered sleeping   0 0   Tired, decreased energy   2 2   Change in appetite   0 0   Feeling bad or failure about yourself    0 0   Trouble concentrating   0 0   Moving slowly or fidgety/restless   0 0   Suicidal thoughts   0 0   PHQ-9 Score   2 2   Difficult doing work/chores   Not difficult at all Not difficult at all         06/29/2023    3:05 PM  Fall Risk   Falls in the past year? 0  Number falls in past yr: 0  Injury with Fall? 0  Risk for fall due to : No Fall Risks  Follow up Falls evaluation completed    Patient Care Team: Yalanda Soderman, Burleigh Carp, MD as PCP - General (Family Medicine) Croitoru, Karyl Paget, MD as PCP - Cardiology (Cardiology) Melodie Spry, MD as Consulting  Physician (Nephrology)   Review of Systems  Current Outpatient Medications on File Prior to Visit  Medication Sig Dispense Refill   albuterol  (VENTOLIN  HFA) 108 (90 Base) MCG/ACT inhaler INHALE 2 PUFFS BY MOUTH INTO LUNGS EVERY 6 HOURS AS NEEDED FOR SHORTNESS OF BREATH OR WHEEZING 8.5 g 0   clopidogrel  (PLAVIX ) 75 MG tablet TAKE ONE TABLET BY MOUTH EVERY MORNING 180 tablet 0   cyanocobalamin  (VITAMIN B12) 1000 MCG tablet TAKE ONE TABLET BY MOUTH EVERY MORNING (Patient not taking: Reported on 06/29/2023) 90 tablet 3   ELIQUIS  5 MG TABS tablet TAKE ONE TABLET BY MOUTH TWICE DAILY 360 tablet 0   icosapent  Ethyl (VASCEPA ) 1 g capsule Take 2 capsules (2 g total) by mouth 2 (two) times daily. (Patient not taking: Reported on 06/29/2023) 120 capsule 2   metoprolol  tartrate (LOPRESSOR ) 50 MG tablet TAKE ONE TABLET BY MOUTH TWICE DAILY 240 tablet 0   Multiple Vitamin (MULTIVITAMIN WITH MINERALS) TABS tablet Take 1 tablet by mouth daily.     Omega-3 Fatty Acids (FISH OIL ) 1000 MG  CAPS Take 1 capsule (1,000 mg total) by mouth 2 (two) times daily. 180 capsule 0   ondansetron  (ZOFRAN ) 4 MG tablet Take 1 tablet (4 mg total) by mouth every 8 (eight) hours as needed for nausea or vomiting. (Patient not taking: Reported on 06/29/2023) 20 tablet 0   pantoprazole  (PROTONIX ) 40 MG tablet TAKE ONE TABLET BY MOUTH EVERY MORNING AND TAKE ONE TABLET BY MOUTH EVERY EVENING 240 tablet 0   QC VITAMIN B12 1000 MCG TBCR TAKE ONE TABLET BY MOUTH EVERY MORNING 120 tablet 0   REPATHA  SURECLICK 140 MG/ML SOAJ INJECT 140MG  ( ) INTO THE SKIN EVERY 14 DAYS 2 mL 0   rosuvastatin  (CRESTOR ) 40 MG tablet TAKE ONE TABLET BY MOUTH EVERY EVENING 120 tablet 0   TRELEGY ELLIPTA  100-62.5-25 MCG/ACT AEPB INHALE 1 PUFF BY MOUTH INTO LUNGS DAILY. RINSE MOUTH AFTER USE 420 each 0   valsartan  (DIOVAN ) 80 MG tablet Take 1 tablet (80 mg total) by mouth daily. (Patient not taking: Reported on 06/29/2023) 90 tablet 3   valsartan  (DIOVAN ) 80 MG tablet  TAKE ONE TABLET BY MOUTH EVERY DAY 30 tablet 0   No current facility-administered medications on file prior to visit.   Past Medical History:  Diagnosis Date   Acquired thrombophilia (HCC) 12/06/2020   B12 deficiency 07/04/2021   Chronic obstructive pulmonary disease (HCC) 07/16/2018   Class 2 severe obesity due to excess calories with serious comorbidity and body mass index (BMI) of 37.0 to 37.9 in adult (HCC) 12/06/2020   BMI 35 with serious comorbidities including CAD, HTN, STROKE, Hyperlipidemia.   Colon polyp 10/25/2020   serrated adenoma.   Coronary artery disease due to lipid rich plaque 01/01/2021   Coronary artery disease with stable angina pectoris [facility])    AMI No heart cath, treated medically [facility]   Daytime somnolence 08/04/2022   Diverticulosis of colon 10/25/2020   Noted on colnoscopy by Dr Antony Baumgartner in [place].   DNR (do not resuscitate) 03/03/2019   History of blood clotting disorder 06/11/2021   History of CVA (cerebrovascular accident) 07/16/2018   Formatting of this note might be different from the original. CVA 3 years ago left with short term memory deficits. Has been on Plavix  since.   History of pulmonary embolism 02/15/2021   Hyperlipemia    Hypertensive heart disease without congestive heart failure 12/06/2020   Hypoglycemia 04/15/2021   Hypokalemia 04/13/2021   Hypomagnesemia 06/11/2021   Impingement of left shoulder 04/05/2022   Intra-abdominal fluid collection 07/18/2021   Medication monitoring encounter 06/24/2021   Mixed hyperlipidemia 07/22/2018   Morbid obesity (HCC) 12/06/2020   BMI 35 with serious comorbidities including CAD, HTN, STROKE, Hyperlipidemia.   Normocytic anemia 04/15/2021   Other fatigue 12/06/2020   Pericardial effusion 04/29/2021   Protein-calorie malnutrition, severe 04/15/2021   Pulmonary nodules/lesions, multiple 11/25/2012   9/15/2014CT Chest : small nodule RUL LUL. Stable since 03/2012 Smoking cessation advised and Rx  chantix  Repeat CT Chest 4 /21/15: subpleural lymph nodes, likely benign repeat one year 2016>>>no change, considered benign Spiro 11/25/2012  NORMAL   Sequela, post-stroke 07/28/2019   Sleep apnea    CPAP   VRE (vancomycin-resistant Enterococci) infection 05/11/2021   Past Surgical History:  Procedure Laterality Date   COLONOSCOPY WITH PROPOFOL  N/A 10/25/2020   Luke Salaam, MD, at [facility]. 25 MM serrated adenoma polyp at ascending colon removed and site clipped.  Pan diverticulosis.   IR CATHETER TUBE CHANGE  09/22/2021   IR CATHETER TUBE CHANGE  10/10/2021  IR RADIOLOGIST EVAL & MGMT  05/25/2021   IR RADIOLOGIST EVAL & MGMT  08/09/2021   IR RADIOLOGIST EVAL & MGMT  08/23/2021   IR RADIOLOGIST EVAL & MGMT  09/21/2021   IR RADIOLOGIST EVAL & MGMT  10/07/2021   LAPAROSCOPIC CHOLECYSTECTOMY  02/2021   PERICARDIOCENTESIS N/A 05/01/2021   Procedure: PERICARDIOCENTESIS;  Surgeon: Swazil[place], Peter M, MD;  Location: [place] [facility] INVASIVE CV LAB;  Service: Cardiovascular;  Laterality: N/A;   TONSILLECTOMY      Family History  Problem Relation Age of Onset   Pulmonary embolism Mother    Hypertension Father    Heart attack Father    Stroke Father    Prostate cancer Father    Lung disease Father    Cancer Sister 62       lung cancer   Hypertension Sister    Cancer Sister 70       lung cancer   Lung cancer Sister    Cancer Brother        lung   Hypertension Brother    Heart disease Brother    Cancer Brother 62       throat and lung cancer   Heart attack Brother    Cancer - Lung Brother    Brain cancer Brother    Social History   Socioeconomic History   Marital status: Married    Spouse name: Dawn   Number of children: 2   Years of education: Not on file   Highest education level: GED or equivalent  Occupational History   Not on file  Tobacco Use   Smoking status: Former    Current packs/day: 0.00    Average packs/day: 0.8 packs/day for 41.7 years (31.2 ttl pk-yrs)    Types: Cigarettes     Start date: 03/14/1979    Quit date: 11/2020    Years since quitting: 2.7   Smokeless tobacco: Never  Vaping Use   Vaping status: Former   Start date: 07/28/2018  Substance and Sexual Activity   Alcohol use: No   Drug use: No   Sexual activity: Yes    Partners: Female  Other Topics Concern   Not on file  Social History Narrative   Not on file   Social Drivers of Health   Financial Resource Strain: Low Risk  (08/26/2023)   Overall Financial Resource Strain (CARDIA)    Difficulty of Paying Living Expenses: Not hard at all  Food Insecurity: No Food Insecurity (08/26/2023)   Hunger Vital Sign    Worried About Running Out of Food in the Last Year: Never true    Ran Out of Food in the Last Year: Never true  Transportation Needs: No Transportation Needs (08/26/2023)   PRAPARE - Administrator, Civil Service (Medical): No    Lack of Transportation (Non-Medical): No  Physical Activity: Insufficiently Active (08/26/2023)   Exercise Vital Sign    Days of Exercise per Week: 4 days    Minutes of Exercise per Session: 30 min  Stress: No Stress Concern Present (08/26/2023)   [employer] of Occupational Health - Occupational Stress Questionnaire    Feeling of Stress: Not at all  Social Connections: Moderately Isolated (08/26/2023)   Social Connection and Isolation Panel    Frequency of Communication with Friends and Family: More than three times a week    Frequency of Social Gatherings with Friends and Family: Once a week    Attends Religious Services: Never    Database administrator or  Organizations: No    Attends Engineer, structural: Not on file    Marital Status: Married    Objective:  There were no vitals taken for this visit.     06/29/2023    1:00 PM 05/28/2023    9:03 AM 05/08/2023   11:12 AM  BP/Weight  Systolic BP 136 130 142  Diastolic BP 86 80 92  Wt. (Lbs)  279 239  BMI  47.89 kg/m2 37.43 kg/m2    Physical Exam  {Perform Simple Foot Exam   Perform Detailed exam:1} {Insert foot Exam (Optional):30965}   Lab Results  Component Value Date   WBC 10.0 05/28/2023   HGB 16.6 05/28/2023   HCT 50.0 05/28/2023   PLT 315 05/28/2023   GLUCOSE 94 05/28/2023   CHOL 53 (L) 05/28/2023   TRIG 98 05/28/2023   HDL 30 (L) 05/28/2023   LDLCALC 4 05/28/2023   ALT 25 05/28/2023   AST 15 05/28/2023   NA 139 05/28/2023   K 5.0 05/28/2023   CL 102 05/28/2023   CREATININE 1.36 (H) 05/28/2023   BUN 20 05/28/2023   CO2 20 05/28/2023   TSH 3.170 04/05/2022   INR 1.2 07/21/2021      Assessment & Plan:  Hypertensive heart disease without congestive heart failure  Simple chronic bronchitis (HCC)  Gastroesophageal reflux disease without esophagitis  Stage 3b chronic kidney disease (CKD) (HCC)  Mixed hyperlipidemia  B12 deficiency     No orders of the defined types were placed in this encounter.   No orders of the defined types were placed in this encounter.    Follow-up: No follow-ups on file.   I,Marla I Leal-Borjas,acting as a scribe for Mercy Stall, MD.,have documented all relevant documentation on the behalf of Mercy Stall, MD,as directed by  Mercy Stall, MD while in the presence of Mercy Stall, MD.   An After Visit Summary was printed and given to the patient.  Mercy Stall, MD [facility] [phone]

## 2023-08-30 ENCOUNTER — Encounter: Payer: Self-pay | Admitting: Family Medicine

## 2023-08-30 ENCOUNTER — Ambulatory Visit (INDEPENDENT_AMBULATORY_CARE_PROVIDER_SITE_OTHER): Admitting: Family Medicine

## 2023-08-30 VITALS — BP 136/86 | HR 73 | Temp 98.2°F | Ht 64.0 in | Wt 236.0 lb

## 2023-08-30 DIAGNOSIS — I119 Hypertensive heart disease without heart failure: Secondary | ICD-10-CM

## 2023-08-30 DIAGNOSIS — N1832 Chronic kidney disease, stage 3b: Secondary | ICD-10-CM

## 2023-08-30 DIAGNOSIS — E66813 Obesity, class 3: Secondary | ICD-10-CM

## 2023-08-30 DIAGNOSIS — J410 Simple chronic bronchitis: Secondary | ICD-10-CM | POA: Diagnosis not present

## 2023-08-30 DIAGNOSIS — K219 Gastro-esophageal reflux disease without esophagitis: Secondary | ICD-10-CM

## 2023-08-30 DIAGNOSIS — E538 Deficiency of other specified B group vitamins: Secondary | ICD-10-CM

## 2023-08-30 DIAGNOSIS — Z6841 Body Mass Index (BMI) 40.0 and over, adult: Secondary | ICD-10-CM | POA: Diagnosis not present

## 2023-08-30 DIAGNOSIS — E782 Mixed hyperlipidemia: Secondary | ICD-10-CM | POA: Diagnosis not present

## 2023-08-30 LAB — CBC WITH DIFFERENTIAL/PLATELET
Basophils Absolute: 0.1 10*3/uL (ref 0.0–0.2)
Basos: 1 %
EOS (ABSOLUTE): 0.2 10*3/uL (ref 0.0–0.4)
Eos: 2 %
Hematocrit: 51.3 % — ABNORMAL HIGH (ref 37.5–51.0)
Hemoglobin: 16.7 g/dL (ref 13.0–17.7)
Immature Grans (Abs): 0 10*3/uL (ref 0.0–0.1)
Immature Granulocytes: 0 %
Lymphocytes Absolute: 2.3 10*3/uL (ref 0.7–3.1)
Lymphs: 25 %
MCH: 29.4 pg (ref 26.6–33.0)
MCHC: 32.6 g/dL (ref 31.5–35.7)
MCV: 90 fL (ref 79–97)
Monocytes Absolute: 0.9 10*3/uL (ref 0.1–0.9)
Monocytes: 9 %
Neutrophils Absolute: 5.9 10*3/uL (ref 1.4–7.0)
Neutrophils: 63 %
Platelets: 326 10*3/uL (ref 150–450)
RBC: 5.68 x10E6/uL (ref 4.14–5.80)
RDW: 15.2 % (ref 11.6–15.4)
WBC: 9.3 10*3/uL (ref 3.4–10.8)

## 2023-08-30 LAB — COMPREHENSIVE METABOLIC PANEL WITH GFR
ALT: 22 IU/L (ref 0–44)
AST: 18 IU/L (ref 0–40)
Albumin: 4.1 g/dL (ref 3.8–4.9)
Alkaline Phosphatase: 94 IU/L (ref 44–121)
BUN/Creatinine Ratio: 13 (ref 9–20)
BUN: 17 mg/dL (ref 6–24)
Bilirubin Total: 0.3 mg/dL (ref 0.0–1.2)
CO2: 20 mmol/L (ref 20–29)
Calcium: 9.4 mg/dL (ref 8.7–10.2)
Chloride: 103 mmol/L (ref 96–106)
Creatinine, Ser: 1.32 mg/dL — ABNORMAL HIGH (ref 0.76–1.27)
Globulin, Total: 2.3 g/dL (ref 1.5–4.5)
Glucose: 99 mg/dL (ref 70–99)
Potassium: 4.4 mmol/L (ref 3.5–5.2)
Sodium: 138 mmol/L (ref 134–144)
Total Protein: 6.4 g/dL (ref 6.0–8.5)
eGFR: 62 mL/min/{1.73_m2} (ref >59)

## 2023-08-30 LAB — LIPID PANEL
Chol/HDL Ratio: 2.5 ratio (ref 0.0–5.0)
Cholesterol, Total: 67 mg/dL — ABNORMAL LOW (ref 100–199)
HDL: 27 mg/dL — ABNORMAL LOW (ref >39)
LDL Chol Calc (NIH): 17 mg/dL (ref 0–99)
Triglycerides: 125 mg/dL (ref 0–149)
VLDL Cholesterol Cal: 23 mg/dL (ref 5–40)

## 2023-08-30 NOTE — Assessment & Plan Note (Signed)
 He takes an over-the-counter B12 supplement for B12 deficiency.   - Continue over-the-counter B12 supplementation

## 2023-08-30 NOTE — Assessment & Plan Note (Signed)
 He uses Trelegy inhaler once daily and reports no frequent use of albuterol, indicating well-managed COPD.   - Continue Trelegy inhaler one puff daily

## 2023-08-30 NOTE — Assessment & Plan Note (Signed)
 The current medical regimen is effective;  continue present plan and medications. He is on valsartan 80 mg once daily and metoprolol 50 mg twice daily for hypertension management.   - Continue valsartan 80 mg once daily   - Continue metoprolol 50 mg twice daily

## 2023-08-30 NOTE — Assessment & Plan Note (Signed)
 At goal.  Recommend continue to work on eating healthy diet and exercise. Continue Rosuvastatin 40 mg daily at bedtime, Repatha 140 mg every 14 days, Fish Oil 1000 mg twice a day.

## 2023-08-30 NOTE — Assessment & Plan Note (Signed)
 He is on pantoprazole 40 mg twice daily. Attempts to reduce the dosage have led to worsening reflux symptoms, necessitating continued twice-daily dosing.   - Continue pantoprazole 40 mg twice daily

## 2023-08-30 NOTE — Assessment & Plan Note (Signed)
 Stable

## 2023-09-02 ENCOUNTER — Ambulatory Visit: Payer: Self-pay | Admitting: Family Medicine

## 2023-09-02 NOTE — Assessment & Plan Note (Signed)
 Recommend continue to work on eating healthy diet and exercise.

## 2023-09-07 IMAGING — CT CT ABD-PELV W/ CM
2 of 5 series · 10 of 46 positions shown, 11 images · IV contrast (agent unspecified)
Comparison: 04/18/2021
COMPARISON: 04/18/2021

Addendum:
CLINICAL DATA: Necrotizing pancreatitis, follow-up examination

EXAM:
CT ABDOMEN AND PELVIS WITH CONTRAST
TECHNIQUE: Multidetector CT imaging of the abdomen and pelvis was performed
using the standard protocol following bolus administration of
intravenous contrast.

[Series 3: a/p w/ 5mm · axial · 0.98mm/px · z∈[+1042,+1442]mm · 7 of 102 slices shown, 8 images]
[im 11/102  soft-tissue]
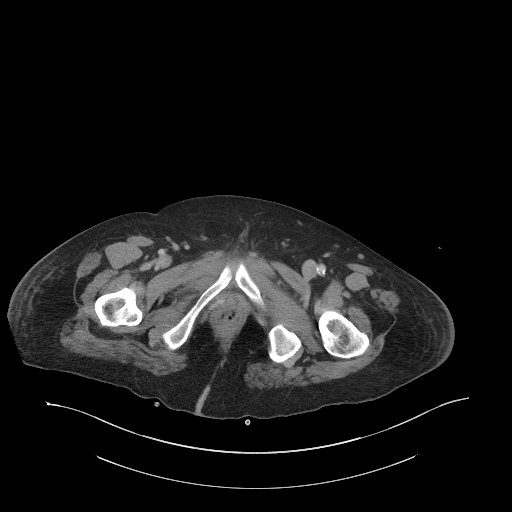
[im 11/102  bone]
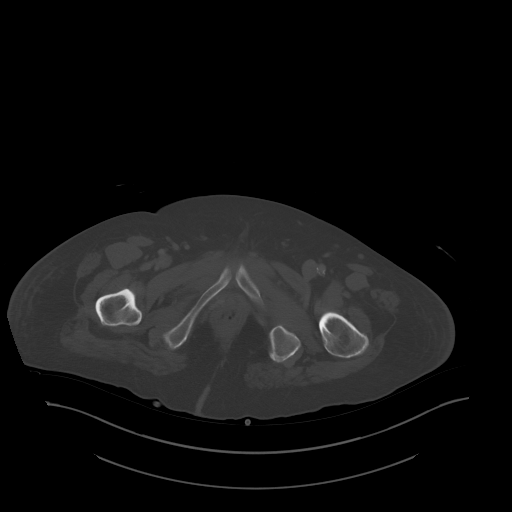
[im 26/102  soft-tissue]
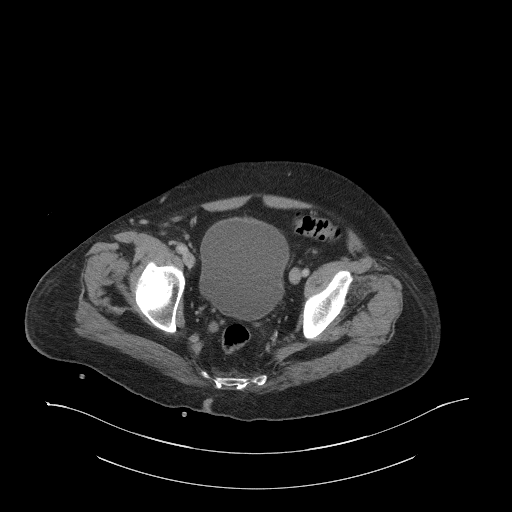
[im 36/102  soft-tissue]
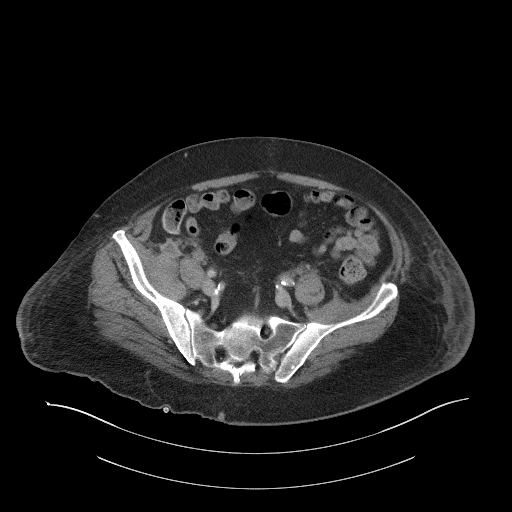
[im 51/102  soft-tissue]
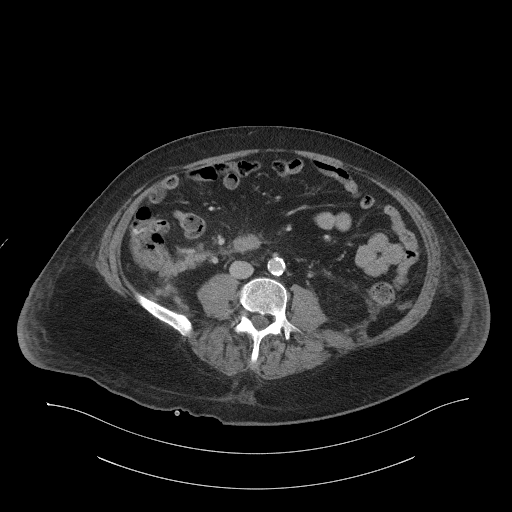
[im 66/102  soft-tissue]
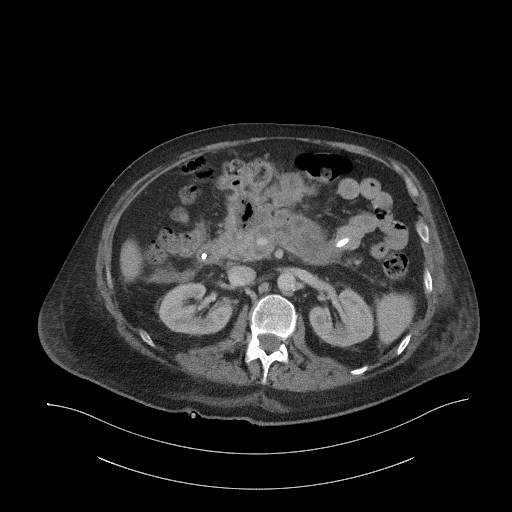
[im 76/102  soft-tissue]
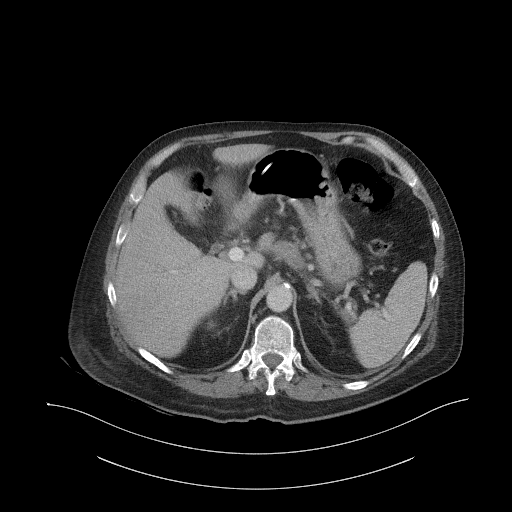
[im 91/102  soft-tissue]
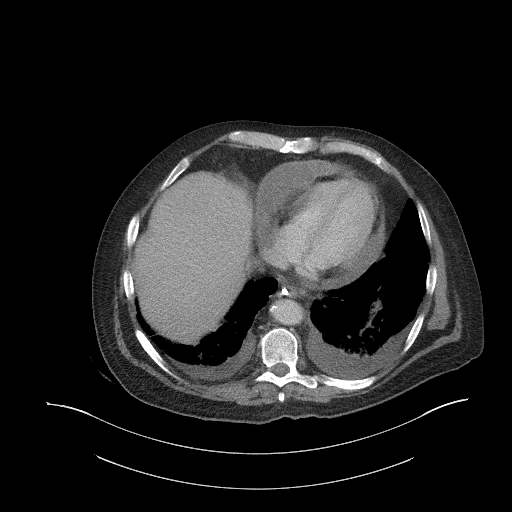

[Series 6: a/p w/ cor · coronal · 0.97mm/px · 3 of 162 slices shown]
[im 54/162  soft-tissue]
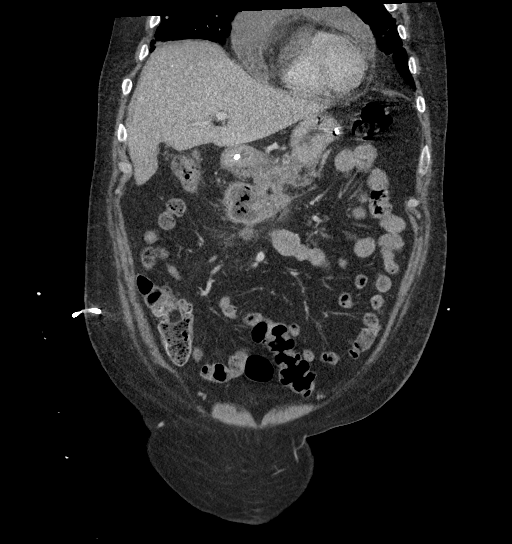
[im 72/162  soft-tissue]
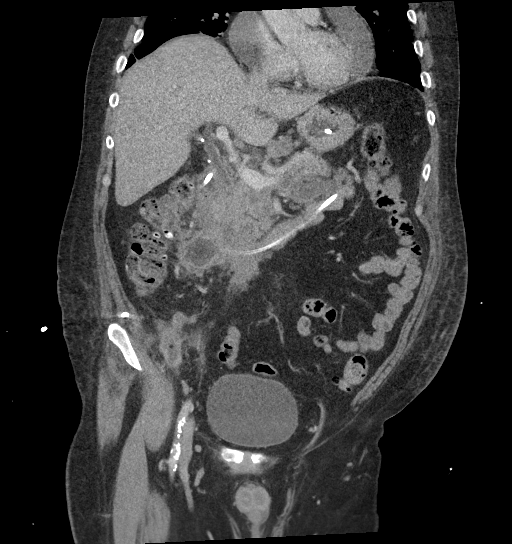
[im 90/162  soft-tissue]
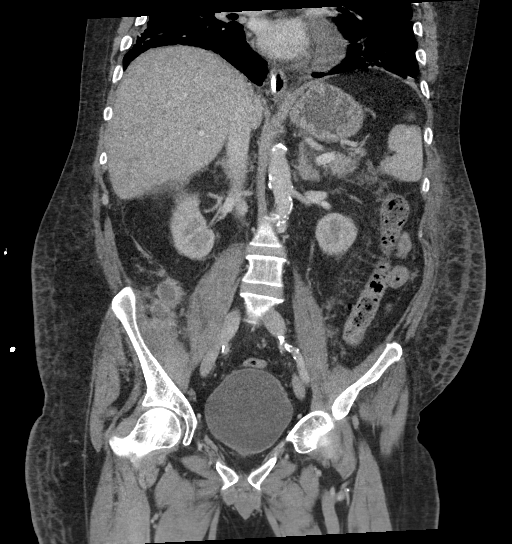

[10 of 46 positions shown; findings below may reference images not displayed]

RADIATION DOSE REDUCTION: This exam was performed according to the
departmental dose-optimization program which includes automated
exposure control, adjustment of the mA and/or kV according to
patient size and/or use of iterative reconstruction technique.

CONTRAST:  100mL OMNIPAQUE IOHEXOL 300 MG/ML  SOLN
FINDINGS: Lower chest: Moderate, complex appearing pericardial effusion
demonstrating subtle pericardial enhancement has developed with
evidence of cardiac tamponade and relative leftward shift of the
interventricular septum when compared to prior examination. Small
bilateral pleural effusions are again identified with associated
bibasilar compressive atelectasis. Nodular densities within the
subpleural right middle lobe are unchanged.

Hepatobiliary: No focal liver abnormality is seen. Status post
cholecystectomy. No biliary dilatation. Mild pneumobilia within the
nondependent left hepatic lobe again noted.

Pancreas: Findings in keeping with necrotizing pancreatitis are
again identified with heterogeneous enhancement the pancreatic
parenchyma best appreciated within the head and body of the
pancreas, stable since prior examination. Acute peripancreatic
necrotic collections adjacent to the mid body of the pancreas at
axial image # 36/3 measuring 3.9 x 2.9 cm appear better defined
though a discrete capsule is not clearly identified to suggest
walled off necrosis at this time. However, more loculated gas and
complex fluid containing collections adjacent to the head of the
pancreas measuring 3.0 x 3.9 cm at axial image # 38/3 and 2.2 x
cm at axial image # 47/3 now demonstrate more well-defined enhancing
margins and are compatible with areas of walled-off necrosis. As
noted previously, super infection is not excluded. Smaller
peripancreatic acute necrotic collections adjacent to the tail of
the pancreas appear relatively stable since prior examination.
Inflammatory changes are again identified tracking into the
pericolic gutters bilaterally.

Spleen: Unremarkable.  The splenic vein is patent.

Adrenals/Urinary Tract: Adrenal glands are unremarkable. Kidneys are
normal, without renal calculi, focal lesion, or hydronephrosis.
Bladder is unremarkable.

Stomach/Bowel: Nasoenteric feeding tube tip seen within the proximal
jejunum beyond the mid lumen of Treitz. Mild ascending colonic
diverticulosis. The stomach, small bowel, and large bowel are
otherwise unremarkable there is no evidence of obstruction or focal
inflammation. Appendix normal. No free intraperitoneal gas or fluid.

Vascular/Lymphatic: Previously noted narrowing of the portosplenic
confluence, best appreciated on axial image # 36/3, appears improved
since prior examination. The splenic vein, superior mesenteric vein,
and portal vein appear patent. Extensive aortoiliac atherosclerotic
calcification. Focal excrescence of the infrarenal abdominal aorta
measuring 3.4 cm in transaxial dimension at axial image # 41/3, is
stable possibly representing a a thrombosed penetrating
atherosclerotic ulcer or focal dissection. This is unchanged since
remote prior examination of 03/03/2019. No pathologic adenopathy
within the abdomen and pelvis.

Reproductive: Prostate is unremarkable.

Other: The previously noted area of walled-off necrosis within the
right retroperitoneum within the iliac fossa has undergone
percutaneous drainage with Csepeli Cauxeiro loop catheter well position within
the residual collection. The collection has decreased in size, now
measuring 2.9 x 3.6 cm at axial image # 61/3. Tiny right fat
containing inguinal hernia. Mild subcutaneous edema noted within the
flanks bilaterally.

Musculoskeletal: No acute bone abnormality. No lytic or blastic bone
lesions are identified. Stable remote L1 compression fracture
IMPRESSION: Interval development of a moderate pericardial effusion
demonstrating complex features with pericardial enhancement and
evidence of cardiac tamponade with mild relative leftward shift of
the intraventricular septum when compared to prior examination of
04/18/2021. Correlation with echocardiography is recommended.

Stable changes of anasarca with small bilateral pleural effusions
and mild subcutaneous edema within the flanks bilaterally.

Evolving changes of necrotizing pancreatitis. Stable enhancement of
the residual pancreatic parenchyma. Increasing organization
peripancreatic necrosis adjacent to the head and body of the
pancreas with areas of more formal walled-off necrosis adjacent to
the pancreatic head demonstrating slight interval decrease in size
since prior examination. Intraluminal gas within areas of walled-off
necrosis are again identified and superinfection is not excluded.

Interval percutaneous drainage of area retroperitoneal walled-off
necrosis within the right iliac fossa with interval decrease in size
of loculated collection.

Improved mass effect upon the portosplenic confluence with wide
patency of the superior mesenteric vein, splenic vein, and portal
vein.

Attempts are being made at this time to contact the patient is
managing clinician for direct verbal communication of these
findings.

ADDENDUM:
These results were called by telephone at the time of interpretation
on 04/28/2021 at [DATE] to provider Dr. Bayusetiyaki, Who verbally
acknowledged these results.

*** End of Addendum ***
RADIATION DOSE REDUCTION: This exam was performed according to the
departmental dose-optimization program which includes automated
exposure control, adjustment of the mA and/or kV according to
patient size and/or use of iterative reconstruction technique.

CONTRAST:  100mL OMNIPAQUE IOHEXOL 300 MG/ML  SOLN
FINDINGS: Lower chest: Moderate, complex appearing pericardial effusion
demonstrating subtle pericardial enhancement has developed with
evidence of cardiac tamponade and relative leftward shift of the
interventricular septum when compared to prior examination. Small
bilateral pleural effusions are again identified with associated
bibasilar compressive atelectasis. Nodular densities within the
subpleural right middle lobe are unchanged.

Hepatobiliary: No focal liver abnormality is seen. Status post
cholecystectomy. No biliary dilatation. Mild pneumobilia within the
nondependent left hepatic lobe again noted.

Pancreas: Findings in keeping with necrotizing pancreatitis are
again identified with heterogeneous enhancement the pancreatic
parenchyma best appreciated within the head and body of the
pancreas, stable since prior examination. Acute peripancreatic
necrotic collections adjacent to the mid body of the pancreas at
axial image # 36/3 measuring 3.9 x 2.9 cm appear better defined
though a discrete capsule is not clearly identified to suggest
walled off necrosis at this time. However, more loculated gas and
complex fluid containing collections adjacent to the head of the
pancreas measuring 3.0 x 3.9 cm at axial image # 38/3 and 2.2 x
cm at axial image # 47/3 now demonstrate more well-defined enhancing
margins and are compatible with areas of walled-off necrosis. As
noted previously, super infection is not excluded. Smaller
peripancreatic acute necrotic collections adjacent to the tail of
the pancreas appear relatively stable since prior examination.
Inflammatory changes are again identified tracking into the
pericolic gutters bilaterally.

Spleen: Unremarkable.  The splenic vein is patent.

Adrenals/Urinary Tract: Adrenal glands are unremarkable. Kidneys are
normal, without renal calculi, focal lesion, or hydronephrosis.
Bladder is unremarkable.

Stomach/Bowel: Nasoenteric feeding tube tip seen within the proximal
jejunum beyond the mid lumen of Treitz. Mild ascending colonic
diverticulosis. The stomach, small bowel, and large bowel are
otherwise unremarkable there is no evidence of obstruction or focal
inflammation. Appendix normal. No free intraperitoneal gas or fluid.

Vascular/Lymphatic: Previously noted narrowing of the portosplenic
confluence, best appreciated on axial image # 36/3, appears improved
since prior examination. The splenic vein, superior mesenteric vein,
and portal vein appear patent. Extensive aortoiliac atherosclerotic
calcification. Focal excrescence of the infrarenal abdominal aorta
measuring 3.4 cm in transaxial dimension at axial image # 41/3, is
stable possibly representing a a thrombosed penetrating
atherosclerotic ulcer or focal dissection. This is unchanged since
remote prior examination of 03/03/2019. No pathologic adenopathy
within the abdomen and pelvis.

Reproductive: Prostate is unremarkable.

Other: The previously noted area of walled-off necrosis within the
right retroperitoneum within the iliac fossa has undergone
percutaneous drainage with Csepeli Cauxeiro loop catheter well position within
the residual collection. The collection has decreased in size, now
measuring 2.9 x 3.6 cm at axial image # 61/3. Tiny right fat
containing inguinal hernia. Mild subcutaneous edema noted within the
flanks bilaterally.

Musculoskeletal: No acute bone abnormality. No lytic or blastic bone
lesions are identified. Stable remote L1 compression fracture
IMPRESSION: Interval development of a moderate pericardial effusion
demonstrating complex features with pericardial enhancement and
evidence of cardiac tamponade with mild relative leftward shift of
the intraventricular septum when compared to prior examination of
04/18/2021. Correlation with echocardiography is recommended.

Stable changes of anasarca with small bilateral pleural effusions
and mild subcutaneous edema within the flanks bilaterally.

Evolving changes of necrotizing pancreatitis. Stable enhancement of
the residual pancreatic parenchyma. Increasing organization
peripancreatic necrosis adjacent to the head and body of the
pancreas with areas of more formal walled-off necrosis adjacent to
the pancreatic head demonstrating slight interval decrease in size
since prior examination. Intraluminal gas within areas of walled-off
necrosis are again identified and superinfection is not excluded.

Interval percutaneous drainage of area retroperitoneal walled-off
necrosis within the right iliac fossa with interval decrease in size
of loculated collection.

Improved mass effect upon the portosplenic confluence with wide
patency of the superior mesenteric vein, splenic vein, and portal
vein.

Attempts are being made at this time to contact the patient is
managing clinician for direct verbal communication of these
findings.

## 2023-09-10 ENCOUNTER — Encounter: Payer: Self-pay | Admitting: Family Medicine

## 2023-09-10 DIAGNOSIS — R918 Other nonspecific abnormal finding of lung field: Secondary | ICD-10-CM | POA: Diagnosis not present

## 2023-09-10 DIAGNOSIS — R911 Solitary pulmonary nodule: Secondary | ICD-10-CM | POA: Diagnosis not present

## 2023-09-10 IMAGING — DX DG CHEST 1V PORT
1 series · 1 of 1 positions shown · non-contrast
Comparison: 05/01/2021, [DATE] a.m.

CLINICAL DATA: Pericardial effusion

EXAM:
PORTABLE CHEST 1 VIEW

[chest]
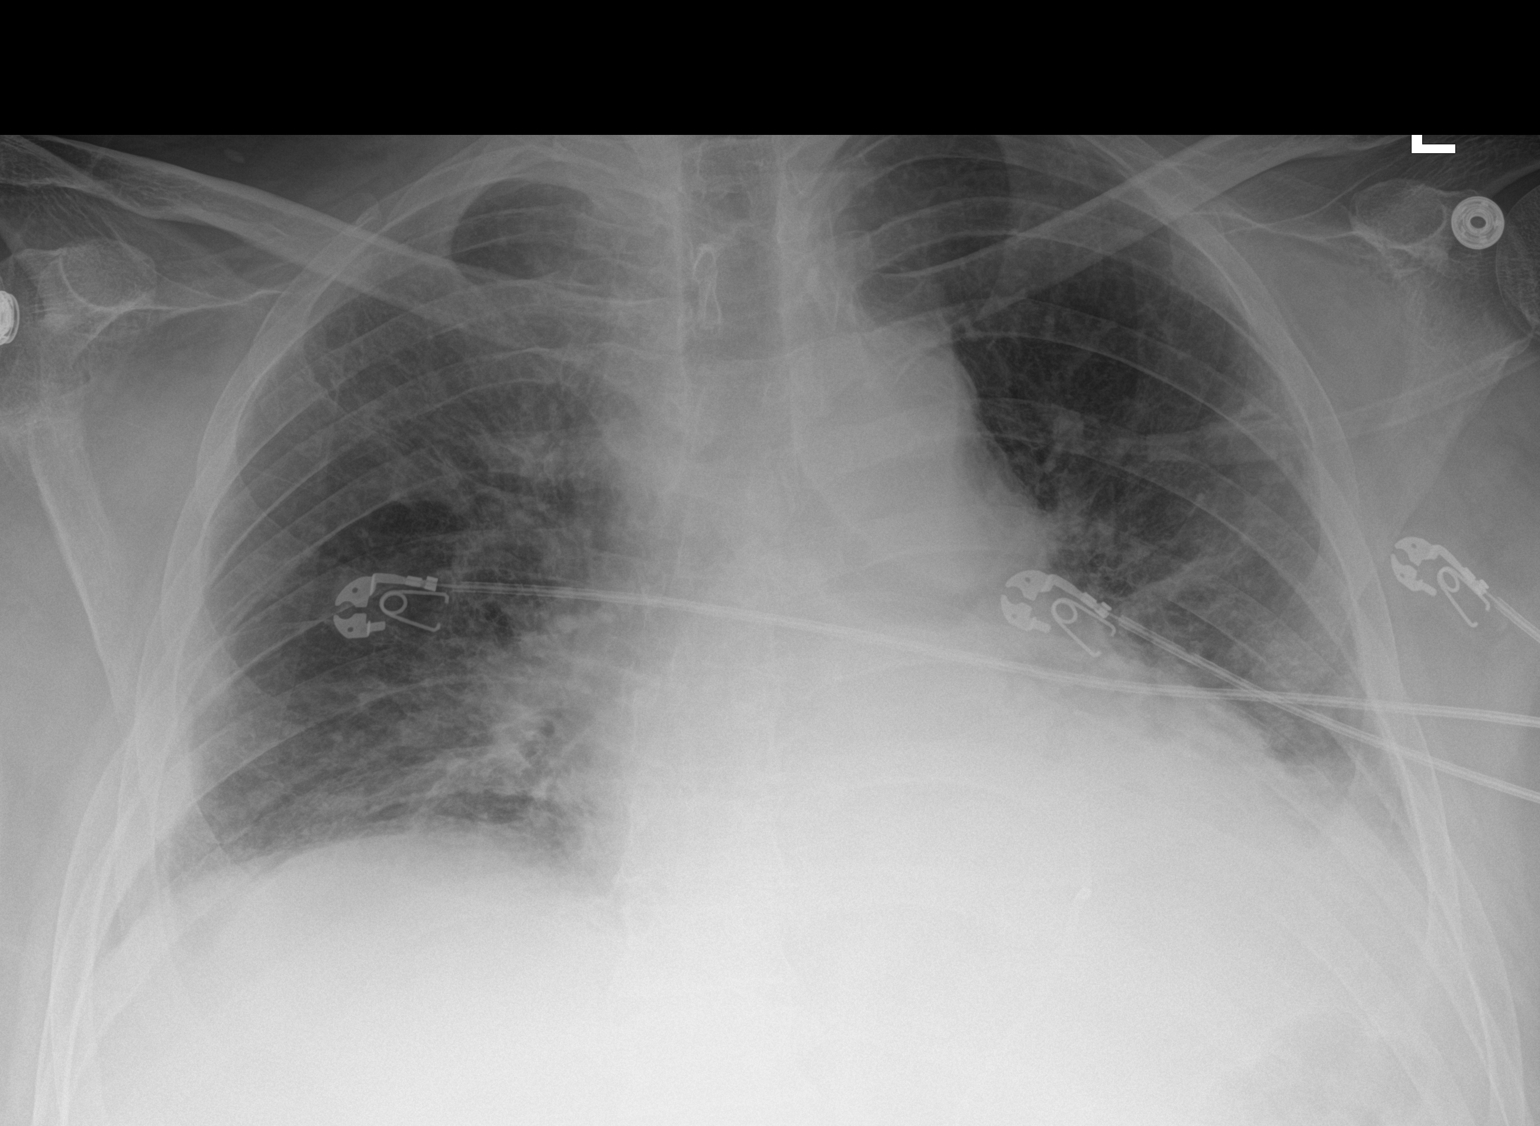

[1 of 1 positions shown; findings below may reference images not displayed]

FINDINGS: Unchanged cardiomegaly. Diffuse bilateral interstitial pulmonary
opacity. Small, layering bilateral pleural effusions. The visualized
skeletal structures are unremarkable.
IMPRESSION: 1. Unchanged AP portable examination. Cardiomegaly in keeping with
known pericardial effusion.

2. Diffuse bilateral interstitial pulmonary opacity and small,
layering bilateral pleural effusions, consistent with edema. No new
airspace opacity.

## 2023-09-11 ENCOUNTER — Ambulatory Visit: Payer: Self-pay | Admitting: Family Medicine

## 2023-09-12 ENCOUNTER — Other Ambulatory Visit: Payer: Self-pay | Admitting: Family Medicine

## 2023-09-12 DIAGNOSIS — I119 Hypertensive heart disease without heart failure: Secondary | ICD-10-CM

## 2023-09-12 DIAGNOSIS — J9611 Chronic respiratory failure with hypoxia: Secondary | ICD-10-CM

## 2023-09-12 DIAGNOSIS — Z9981 Dependence on supplemental oxygen: Secondary | ICD-10-CM

## 2023-09-12 DIAGNOSIS — J449 Chronic obstructive pulmonary disease, unspecified: Secondary | ICD-10-CM

## 2023-09-18 DIAGNOSIS — G4733 Obstructive sleep apnea (adult) (pediatric): Secondary | ICD-10-CM | POA: Diagnosis not present

## 2023-10-01 ENCOUNTER — Ambulatory Visit

## 2023-10-04 ENCOUNTER — Ambulatory Visit

## 2023-10-04 VITALS — Ht 64.0 in | Wt 236.0 lb

## 2023-10-04 DIAGNOSIS — Z0000 Encounter for general adult medical examination without abnormal findings: Secondary | ICD-10-CM

## 2023-10-04 IMAGING — CT CT ABD-PELV W/ CM
2 of 4 series · 11 of 46 positions shown, 12 images · IV contrast (agent unspecified)
Comparison: 05/08/2021, 04/28/2021, 04/18/2021 03/03/2019

CLINICAL DATA: 56-year-old male with history of acute pancreatitis
complicated by walled-off necrosis now status post percutaneous
CT-guided drain placement in the right lower quadrant on 04/18/2021.
Concern for transverse colonic fistula on prior study. The patient
presents to [REDACTED] for follow-up.

EXAM:
CT ABDOMEN AND PELVIS WITH CONTRAST
TECHNIQUE: Multidetector CT imaging of the abdomen and pelvis was performed
using the standard protocol following bolus administration of
intravenous contrast. Rectal contrast was also administered for the
study.

[Series 2: abd pelvis 5.00 br40 s3 axial · axial · 0.61mm/px · z∈[+1123,+1558]mm · 8 of 107 slices shown, 9 images]
[im 10/107  soft-tissue]
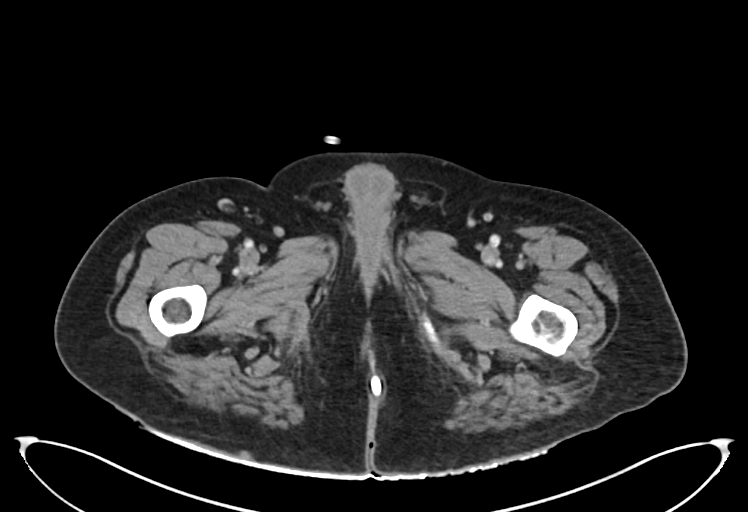
[im 10/107  bone]
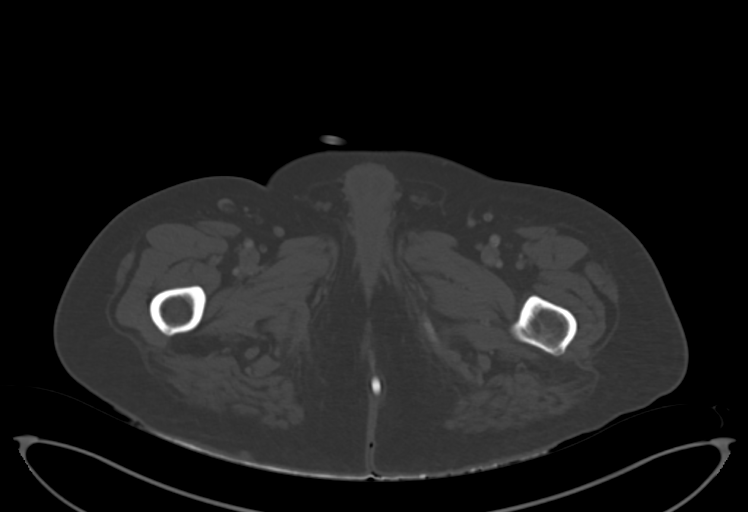
[im 20/107  soft-tissue]
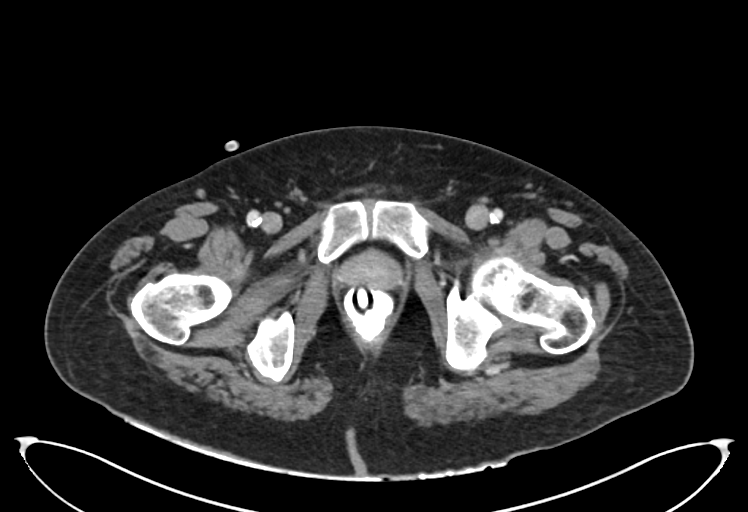
[im 34/107  soft-tissue]
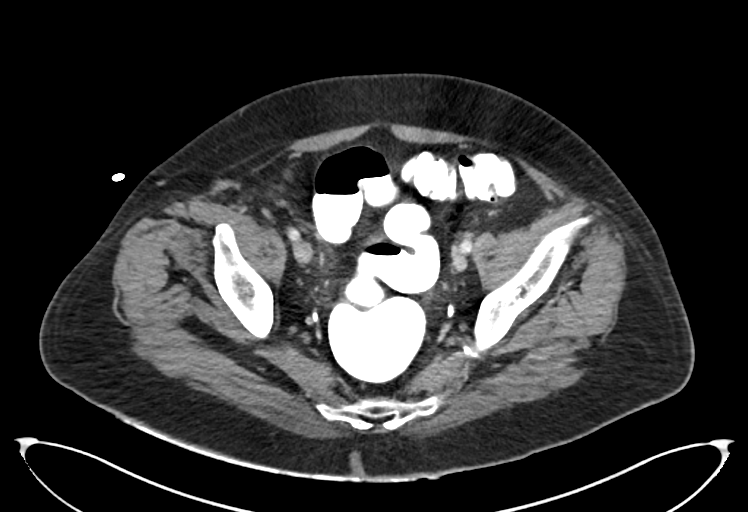
[im 49/107  soft-tissue]
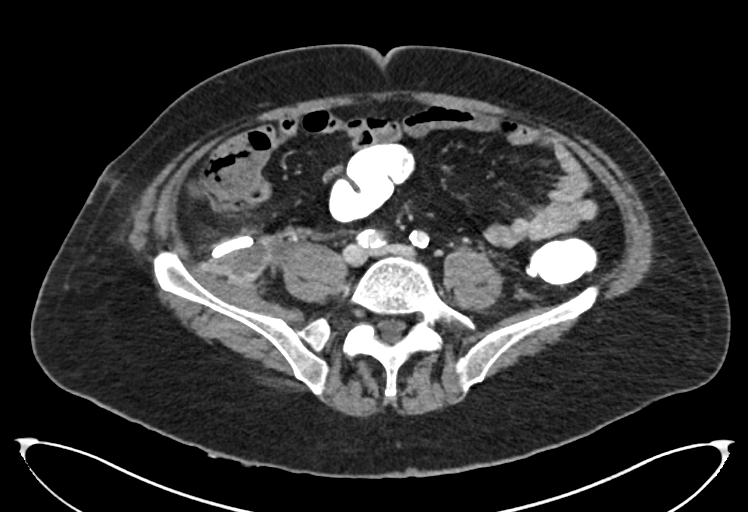
[im 58/107  soft-tissue]
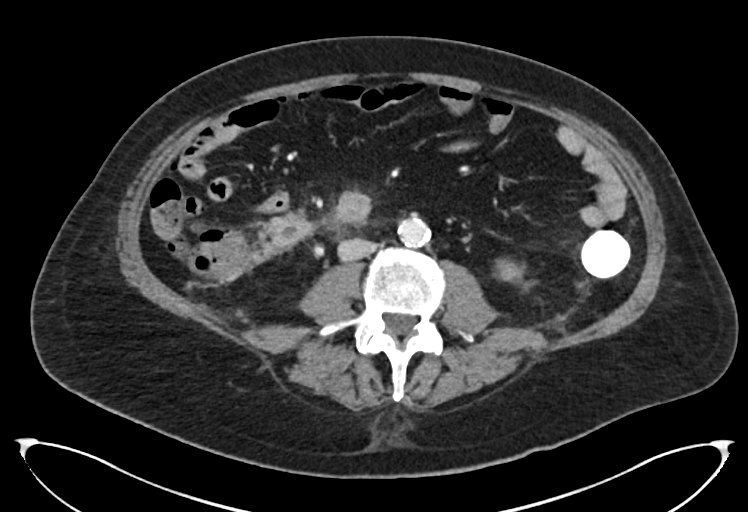
[im 73/107  soft-tissue]
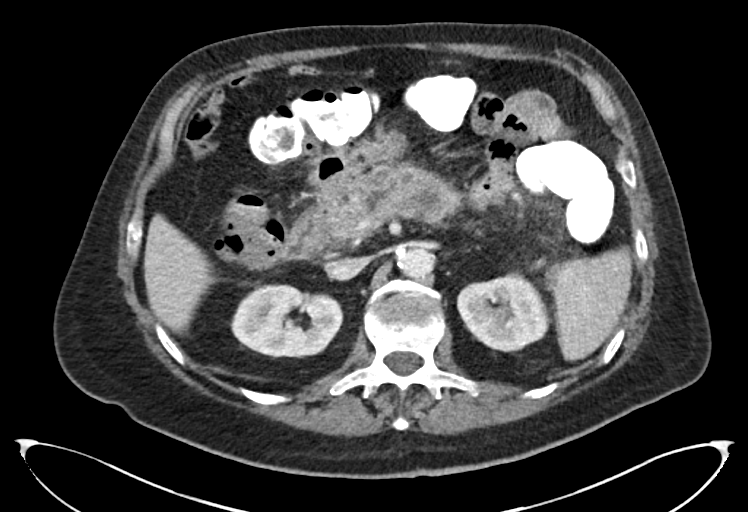
[im 87/107  soft-tissue]
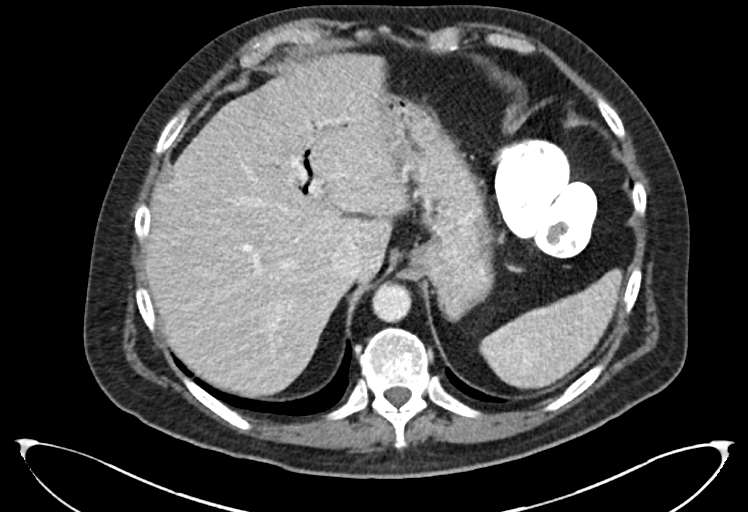
[im 97/107  soft-tissue]
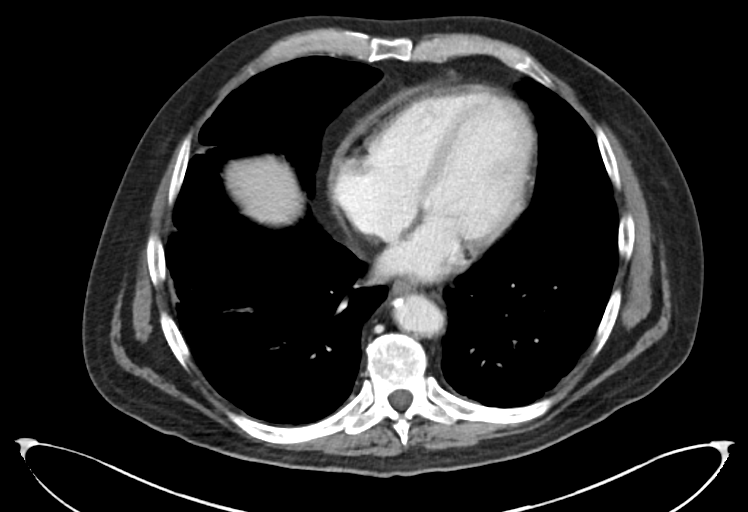

[Series 6: abd pelvis 2.00 br40 s3 cor · coronal · 0.84mm/px · 3 of 169 slices shown]
[im 57/169  soft-tissue]
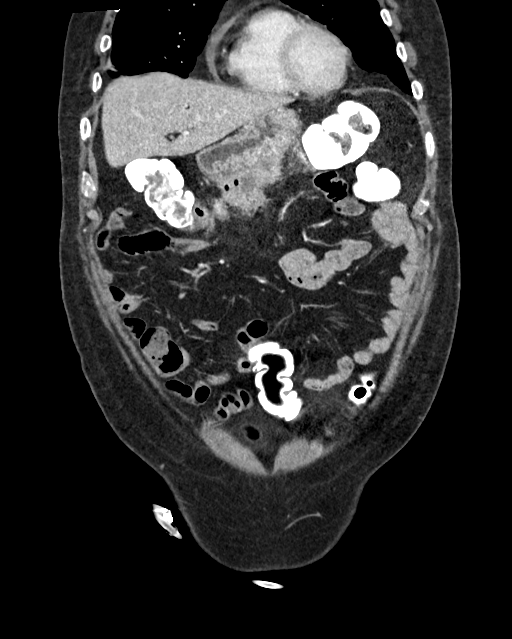
[im 75/169  soft-tissue]
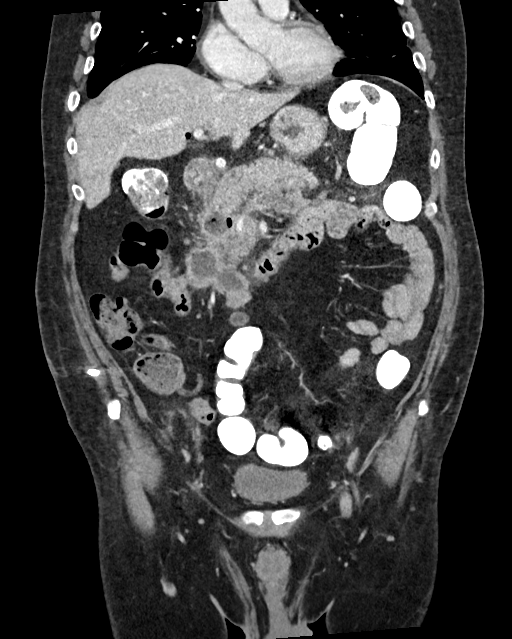
[im 94/169  soft-tissue]
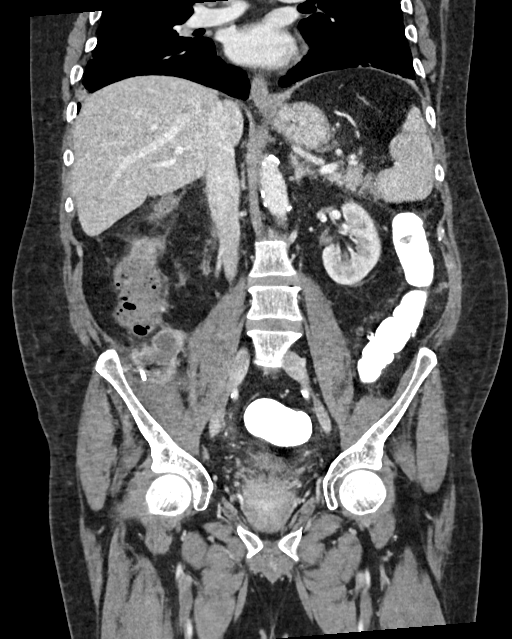

[11 of 46 positions shown; findings below may reference images not displayed]

RADIATION DOSE REDUCTION: This exam was performed according to the
departmental dose-optimization program which includes automated
exposure control, adjustment of the mA and/or kV according to
patient size and/or use of iterative reconstruction technique.

CONTRAST:  100mL WPSEUM-5GG IOPAMIDOL (WPSEUM-5GG) INJECTION 61%
FINDINGS: Lower chest: Resolution of previously visualized small right pleural
effusion. Unchanged subcentimeter solid pulmonary nodules in the
right lower lobe.

Hepatobiliary: No new focal liver abnormality is seen. Unchanged
scattered subcentimeter hypodensities, favored represent simple
hepatic cysts, however too small to definitively characterize by CT.
Status post cholecystectomy. Persistent mild pneumobilia. No biliary
ductal dilation.

Pancreas: Continue resolution of peripancreatic fat stranding in
globally decreased size of multifocal walled off necrosis with
concomitant decreased gas burden in the respective fluid
collections. The largest residual fluid collection remains anterior
to the second portion of the duodenum extending toward the
mesenteric aspect of the transverse colon. The transverse colon is
well opacified with enteric contrast at this level in demonstrates
no evidence of enteric fistulization. There is similar size and
appearance of the right lower quadrant retroperitoneal rim enhancing
fluid collection with unchanged position of the indwelling
percutaneous pigtail drainage catheter. This fluid collection
appears sequestered from the more superior fluid collection that is
located just inferior to the second portion the duodenum.

Spleen: Normal in size without focal abnormality.

Adrenals/Urinary Tract: Adrenal glands are unremarkable. Kidneys are
normal, without renal calculi, focal lesion, or hydronephrosis.
Bladder is unremarkable.

Stomach/Bowel: Stomach is within normal limits. Appendix appears
normal. The colon is well opacified from the hepatic flexure through
the rectum with enteric contrast. No evidence of bowel wall
thickening, distention, or inflammatory changes.

Vascular/Lymphatic: Extensive aortic atherosclerotic changes with
associated right infrarenal penetrating atheromatous ulceration
which measures approximately 1.6 cm in width and 1.7 cm in depth,
relatively unchanged from 8484 comparison. No abdominopelvic
lymphadenopathy.

Reproductive: Prostate is unremarkable.

Other: No new abdominopelvic fluid collections. No abdominal wall
hernia.

Musculoskeletal: No acute osseous abnormality.
IMPRESSION: 1. Continued interval improvement of multifocal walled-off necrosis
secondary to pancreatitis. No new fluid collections or evidence of
recurrent acute pancreatitis.
2. No evidence of colonic fistulization with fluid collection
anterior to the second portion of the duodenum.
3. Apparent sequestration of the right lower
quadrant/retroperitoneal fluid collection with indwelling pigtail
drain which is unchanged in size from comparison.

## 2023-10-04 NOTE — Progress Notes (Signed)
 Subjective:   Scott Foley is a 58 y.o. who presents for a Medicare Wellness preventive visit.  As a reminder, Annual Wellness Visits don't include a physical exam, and some assessments may be limited, especially if this visit is performed virtually. We may recommend an in-person follow-up visit with your provider if needed.  Visit Complete: Virtual I connected with  Scott Foley on 10/04/23 by a audio enabled telemedicine application and verified that I am speaking with the correct person using two identifiers.  Patient Location: Home  Provider Location: Home Office  I discussed the limitations of evaluation and management by telemedicine. The patient expressed understanding and agreed to proceed.  Vital Signs: Because this visit was a virtual/telehealth visit, some criteria may be missing or patient reported. Any vitals not documented were not able to be obtained and vitals that have been documented are patient reported.  VideoDeclined- This patient declined Librarian, academic. Therefore the visit was completed with audio only.  Persons Participating in Visit: Patient.  AWV Questionnaire: No: Patient Medicare AWV questionnaire was not completed prior to this visit.  Cardiac Risk Factors include: advanced age (>72men, >33 women);hypertension;male gender     Objective:    Today's Vitals   10/04/23 1337  Weight: 236 lb (107 kg)  Height: 5' 4 (1.626 m)   Body mass index is 40.51 kg/m.     10/04/2023    1:40 PM 06/29/2023    3:02 PM 04/14/2021    6:06 PM 02/20/2021   11:47 AM 02/09/2021    2:12 AM 10/25/2020    7:45 AM 08/19/2019    1:38 PM  Advanced Directives  Does Patient Have a Medical Advance Directive? No No No No No No No  Would patient like information on creating a medical advance directive? Yes (MAU/Ambulatory/Procedural Areas - Information given) No - Patient declined No - Patient declined No - Patient declined No -  Patient declined  Yes (MAU/Ambulatory/Procedural Areas - Information given)    Current Medications (verified) Outpatient Encounter Medications as of 10/04/2023  Medication Sig   albuterol  (VENTOLIN  HFA) 108 (90 Base) MCG/ACT inhaler INHALE 2 PUFFS BY MOUTH INTO LUNGS EVERY 6 HOURS AS NEEDED FOR SHORTNESS OF BREATH OR WHEEZING   clopidogrel  (PLAVIX ) 75 MG tablet TAKE ONE TABLET BY MOUTH EVERY MORNING   cyanocobalamin  (VITAMIN B12) 1000 MCG tablet TAKE ONE TABLET BY MOUTH EVERY MORNING   ELIQUIS  5 MG TABS tablet TAKE ONE TABLET BY MOUTH TWICE DAILY   icosapent  Ethyl (VASCEPA ) 1 g capsule Take 2 capsules (2 g total) by mouth 2 (two) times daily.   metoprolol  tartrate (LOPRESSOR ) 50 MG tablet TAKE ONE TABLET BY MOUTH TWICE DAILY   Multiple Vitamin (MULTIVITAMIN WITH MINERALS) TABS tablet Take 1 tablet by mouth daily.   Omega-3 Fatty Acids (FISH OIL ) 1000 MG CAPS Take 1 capsule (1,000 mg total) by mouth 2 (two) times daily.   ondansetron  (ZOFRAN ) 4 MG tablet Take 1 tablet (4 mg total) by mouth every 8 (eight) hours as needed for nausea or vomiting.   pantoprazole  (PROTONIX ) 40 MG tablet TAKE ONE TABLET BY MOUTH EVERY MORNING AND TAKE ONE TABLET BY MOUTH EVERY EVENING   QC VITAMIN B12 1000 MCG TBCR TAKE ONE TABLET BY MOUTH EVERY MORNING   REPATHA  SURECLICK 140 MG/ML SOAJ INJECT 140MG  ( ) INTO THE SKIN EVERY 14 DAYS   rosuvastatin  (CRESTOR ) 40 MG tablet TAKE ONE TABLET BY MOUTH EVERY EVENING   TRELEGY ELLIPTA  100-62.5-25 MCG/ACT AEPB INHALE 1  PUFF BY MOUTH INTO LUNGS DAILY. RINSE MOUTH AFTER USE   valsartan  (DIOVAN ) 80 MG tablet TAKE ONE TABLET BY MOUTH EVERY DAY   No facility-administered encounter medications on file as of 10/04/2023.    Allergies (verified) Penicillin g   History: Past Medical History:  Diagnosis Date   Acquired thrombophilia (HCC) 12/06/2020   B12 deficiency 07/04/2021   Chronic obstructive pulmonary disease (HCC) 07/16/2018   Class 2 severe obesity due to excess  calories with serious comorbidity and body mass index (BMI) of 37.0 to 37.9 in adult (HCC) 12/06/2020   BMI 35 with serious comorbidities including CAD, HTN, STROKE, Hyperlipidemia.   Colon polyp 10/25/2020   serrated adenoma.   Coronary artery disease due to lipid rich plaque 01/01/2021   Coronary artery disease with stable angina pectoris [facility])    AMI No heart cath, treated medically [facility]   Daytime somnolence 08/04/2022   Diverticulosis of colon 10/25/2020   Noted on colnoscopy by Dr Therisa in [place].   DNR (do not resuscitate) 03/03/2019   History of blood clotting disorder 06/11/2021   History of CVA (cerebrovascular accident) 07/16/2018   Formatting of this note might be different from the original. CVA 3 years ago left with short term memory deficits. Has been on Plavix  since.   History of pulmonary embolism 02/15/2021   Hyperlipemia    Hypertensive heart disease without congestive heart failure 12/06/2020   Hypoglycemia 04/15/2021   Hypokalemia 04/13/2021   Hypomagnesemia 06/11/2021   Impingement of left shoulder 04/05/2022   Intra-abdominal fluid collection 07/18/2021   Medication monitoring encounter 06/24/2021   Mixed hyperlipidemia 07/22/2018   Morbid obesity (HCC) 12/06/2020   BMI 35 with serious comorbidities including CAD, HTN, STROKE, Hyperlipidemia.   Normocytic anemia 04/15/2021   Other fatigue 12/06/2020   Pericardial effusion 04/29/2021   Protein-calorie malnutrition, severe 04/15/2021   Pulmonary nodules/lesions, multiple 11/25/2012   9/15/2014CT Chest : small nodule RUL LUL. Stable since 03/2012 Smoking cessation advised and Rx chantix  Repeat CT Chest 4 /21/15: subpleural lymph nodes, likely benign repeat one year 2016>>>no change, considered benign Spiro 11/25/2012  NORMAL   Sequela, post-stroke 07/28/2019   Sleep apnea    CPAP   VRE (vancomycin-resistant Enterococci) infection 05/11/2021   Past Surgical History:  Procedure Laterality Date   COLONOSCOPY  WITH PROPOFOL  N/A 10/25/2020   [facility]isa Bi, MD, at [facility]. 25 MM serrated adenoma polyp at ascending colon removed and site clipped.  Pan diverticulosis.   IR CATHETER TUBE CHANGE  09/22/2021   IR CATHETER TUBE CHANGE  10/10/2021   IR RADIOLOGIST EVAL & MGMT  05/25/2021   IR RADIOLOGIST EVAL & MGMT  08/09/2021   IR RADIOLOGIST EVAL & MGMT  08/23/2021   IR RADIOLOGIST EVAL & MGMT  09/21/2021   IR RADIOLOGIST EVAL & MGMT  10/07/2021   LAPAROSCOPIC CHOLECYSTECTOMY  02/2021   PERICARDIOCENTESIS N/A 05/01/2021   Procedure: PERICARDIOCENTESIS;  Surgeon: [place], Peter M, MD;  Location[facility] INVASIVE CV LAB;  Service: Cardiovascular;  Laterality: N/A;   TONSILLECTOMY     Family History  Problem Relation Age of Onset   Pulmonary embolism Mother    Hypertension Father    Heart attack Father    Stroke Father    Prostate cancer Father    Lung disease Father    Cancer Sister 37       lung cancer   Hypertension Sister    Cancer Sister 50       lung cancer   Lung cancer Sister  Cancer Brother        lung   Hypertension Brother    Heart disease Brother    Cancer Brother 62       throat and lung cancer   Heart attack Brother    Cancer - Lung Brother    Brain cancer Brother    Social History   Socioeconomic History   Marital status: Married    Spouse name: Scott Foley   Number of children: 2   Years of education: Not on file   Highest education level: GED or equivalent  Occupational History   Not on file  Tobacco Use   Smoking status: Former    Current packs/day: 0.00    Average packs/day: 0.8 packs/day for 41.7 years (31.2 ttl pk-yrs)    Types: Cigarettes    Start date: 03/14/1979    Quit date: 11/2020    Years since quitting: 2.8   Smokeless tobacco: Never  Vaping Use   Vaping status: Former   Start date: 07/28/2018  Substance and Sexual Activity   Alcohol use: No   Drug use: No   Sexual activity: Yes    Partners: Female  Other Topics Concern   Not on file  Social History Narrative    Not on file   Social Drivers of Health   Financial Resource Strain: Low Risk  (10/04/2023)   Overall Financial Resource Strain (CARDIA)    Difficulty of Paying Living Expenses: Not hard at all  Food Insecurity: No Food Insecurity (10/04/2023)   Hunger Vital Sign    Worried About Running Out of Food in the Last Year: Never true    Ran Out of Food in the Last Year: Never true  Transportation Needs: No Transportation Needs (10/04/2023)   PRAPARE - Administrator, Civil Service (Medical): No    Lack of Transportation (Non-Medical): No  Physical Activity: Insufficiently Active (10/04/2023)   Exercise Vital Sign    Days of Exercise per Week: 4 days    Minutes of Exercise per Session: 30 min  Stress: No Stress Concern Present (10/04/2023)   [employer] of Occupational Health - Occupational Stress Questionnaire    Feeling of Stress: Not at all  Social Connections: Moderately Isolated (10/04/2023)   Social Connection and Isolation Panel    Frequency of Communication with Friends and Family: More than three times a week    Frequency of Social Gatherings with Friends and Family: Once a week    Attends Religious Services: Never    Database administrator or Organizations: No    Attends Engineer, structural: Never    Marital Status: Married    Tobacco Counseling Counseling given: Not Answered    Clinical Intake:  Pre-visit preparation completed: Yes  Pain : No/denies pain     Diabetes: No  No results found for: HGBA1C   How often do you need to have someone help you when you read instructions, pamphlets, or other written materials from your doctor or pharmacy?: 1 - Never  Interpreter Needed?: No  Information entered by :: Charmaine Bloodgood LPN   Activities of Daily Living     10/04/2023    1:37 PM  In your present state of health, do you have any difficulty performing the following activities:  Hearing? 0  Vision? 0  Difficulty concentrating or  making decisions? 0  Walking or climbing stairs? 0  Dressing or bathing? 0  Doing errands, shopping? 0  Preparing Food and eating ? N  Using  the Toilet? N  In the past six months, have you accidently leaked urine? N  Do you have problems with loss of bowel control? N  Managing your Medications? N  Managing your Finances? N  Housekeeping or managing your Housekeeping? N    Patient Care Team: Sherre Clapper, MD as PCP - General (Family Medicine) Francyne Headland, MD as PCP - Cardiology (Cardiology) Tobie Gordy POUR, MD as Consulting Physician (Nephrology)  I have updated your Care Teams any recent Medical Services you may have received from other providers in the past year.     Assessment:   This is a routine wellness examination for Scott Foley.  Hearing/Vision screen Hearing Screening - Comments:: Denies hearing difficulties   Vision Screening - Comments:: Wears rx glasses - up to date with routine eye exams with [facility]    Goals Addressed             This Visit's Progress    Maintain health   On track      Depression Screen     10/04/2023    1:39 PM 08/30/2023    9:07 AM 06/29/2023    3:05 PM 03/30/2023    1:07 PM 09/27/2022    2:09 PM 08/04/2022    9:10 AM 02/24/2022    2:08 PM  PHQ 2/9 Scores  PHQ - 2 Score 0 0 0 0 0 0 0  PHQ- 9 Score 0 0   2 2     Fall Risk     10/04/2023    1:40 PM 08/30/2023    9:07 AM 06/29/2023    3:05 PM 09/25/2022    1:46 PM 08/04/2022    9:10 AM  Fall Risk   Falls in the past year? 0 0 0 0 0  Number falls in past yr: 0 0 0 0 0  Injury with Fall? 0 0 0 0 0  Risk for fall due to : No Fall Risks No Fall Risks No Fall Risks No Fall Risks No Fall Risks  Follow up Falls prevention discussed;Education provided;Falls evaluation completed Falls evaluation completed Falls evaluation completed Falls evaluation completed Falls evaluation completed    MEDICARE RISK AT HOME:  Medicare Risk at Home Any stairs in or around the home?: No If  so, are there any without handrails?: No Home free of loose throw rugs in walkways, pet beds, electrical cords, etc?: Yes Adequate lighting in your home to reduce risk of falls?: Yes Life alert?: No Use of a cane, walker or w/c?: No Grab bars in the bathroom?: Yes Shower chair or bench in shower?: No Elevated toilet seat or a handicapped toilet?: Yes  TIMED UP AND GO:  Was the test performed?  No  Cognitive Function: Declined/Normal: No cognitive concerns noted by patient or family. Patient alert, oriented, able to answer questions appropriately and recall recent events. No signs of memory loss or confusion.        09/27/2022    2:23 PM 08/19/2019    2:07 PM  6CIT Screen  What Year? 0 points 0 points  What month? 0 points 0 points  What time? 0 points 0 points  Count back from 20 0 points 0 points  Months in reverse 2 points 4 points  Repeat phrase 2 points 4 points  Total Score 4 points 8 points    Immunizations Immunization History  Administered Date(s) Administered   Influenza Inj Mdck Quad Pf 12/15/2019, 12/30/2020, 12/01/2021   Influenza Split 01/03/2012   Influenza, Mdck,  Trivalent,PF 6+ MOS(egg free) 11/22/2022   Influenza,inj,Quad PF,6+ Mos 11/25/2012   PNEUMOCOCCAL CONJUGATE-20 12/30/2020   Pneumococcal Polysaccharide-23 11/25/2012   Tdap 09/13/2023    Screening Tests Health Maintenance  Topic Date Due   COVID-19 Vaccine (1) Never done   Hepatitis B Vaccines (1 of 3 - 19+ 3-dose series) Never done   Zoster Vaccines- Shingrix (1 of 2) Never done   Lung Cancer Screening  12/21/2022   INFLUENZA VACCINE  10/12/2023   Medicare Annual Wellness (AWV)  10/03/2024   Colonoscopy  10/26/2030   DTaP/Tdap/Td (2 - Td or Tdap) 09/12/2033   Pneumococcal Vaccine 60-71 Years old  Completed   Hepatitis C Screening  Completed   HIV Screening  Completed   HPV VACCINES  Aged Out   Meningococcal B Vaccine  Aged Out    Health Maintenance  Health Maintenance Due  Topic  Date Due   COVID-19 Vaccine (1) Never done   Hepatitis B Vaccines (1 of 3 - 19+ 3-dose series) Never done   Zoster Vaccines- Shingrix (1 of 2) Never done   Lung Cancer Screening  12/21/2022   Health Maintenance Items Addressed: Patient completed lung screening 03/09/23; not showing due to being done at outside hospital  Additional Screening:  Vision Screening: Recommended annual ophthalmology exams for early detection of glaucoma and other disorders of the eye. Would you like a referral to an eye doctor? No    Dental Screening: Recommended annual dental exams for proper oral hygiene  Community Resource Referral / Chronic Care Management: CRR required this visit?  No   CCM required this visit?  No   Plan:    I have personally reviewed and noted the following in the patient's chart:   Medical and social history Use of alcohol, tobacco or illicit drugs  Current medications and supplements including opioid prescriptions. Patient is not currently taking opioid prescriptions. Functional ability and status Nutritional status Physical activity Advanced directives List of other physicians Hospitalizations, surgeries, and ER visits in previous 12 months Vitals Screenings to include cognitive, depression, and falls Referrals and appointments  In addition, I have reviewed and discussed with patient certain preventive protocols, quality metrics, and best practice recommendations. A written personalized care plan for preventive services as well as general preventive health recommendations were provided to patient.   Lavelle Pfeiffer [place], [place]   2[birth year]74   After Visit Summary: (MyChart) Due to this being a telephonic visit, the after visit summary with patients personalized plan was offered to patient via MyChart   Notes: Nothing significant to report at this time.

## 2023-10-04 NOTE — Patient Instructions (Signed)
 Mr. Scott Foley , Thank you for taking time out of your busy schedule to complete your Annual Wellness Visit with me. I enjoyed our conversation and look forward to speaking with you again next year. I, as well as your care team,  appreciate your ongoing commitment to your health goals. Please review the following plan we discussed and let me know if I can assist you in the future. Your Game plan/ To Do List     Follow up Visits: Next Medicare AWV with our clinical staff: In 1 year    Have you seen your provider in the last 6 months (3 months if uncontrolled diabetes)? Yes Next Office Visit with your provider: 12/31/23 @ 9:20  Clinician Recommendations:  Aim for 30 minutes of exercise or brisk walking, 6-8 glasses of water , and 5 servings of fruits and vegetables each day.       This is a list of the screening recommended for you and due dates:  Health Maintenance  Topic Date Due   COVID-19 Vaccine (1) Never done   Hepatitis B Vaccine (1 of 3 - 19+ 3-dose series) Never done   Zoster (Shingles) Vaccine (1 of 2) Never done   Screening for Lung Cancer  12/21/2022   Flu Shot  10/12/2023   Medicare Annual Wellness Visit  10/03/2024   Colon Cancer Screening  10/26/2030   DTaP/Tdap/Td vaccine (2 - Td or Tdap) 09/12/2033   Pneumococcal Vaccination  Completed   Hepatitis C Screening  Completed   HIV Screening  Completed   HPV Vaccine  Aged Out   Meningitis B Vaccine  Aged Out    Advanced directives: (ACP Link)Information on Advanced Care Planning can be found at Scientist, physiological Health Care Directives Advance Health Care Directives. [email or web address]   Advance Care Planning is important because it:  [x]  Makes sure you receive the medical care that is consistent with your values, goals, and preferences  [x]  It provides guidance to your family and loved ones and reduces their decisional burden about whether or not they are making the right decisions based on your  wishes.  Follow the link provided in your after visit summary or read over the paperwork we have mailed to you to help you started getting your Advance Directives in place. If you need assistance in completing these, please reach out to us  so that we can help you!  See attachments for Preventive Care and Fall Prevention Tips.

## 2023-10-15 ENCOUNTER — Other Ambulatory Visit: Payer: Self-pay | Admitting: Family Medicine

## 2023-10-15 DIAGNOSIS — I2782 Chronic pulmonary embolism: Secondary | ICD-10-CM

## 2023-10-15 DIAGNOSIS — I119 Hypertensive heart disease without heart failure: Secondary | ICD-10-CM

## 2023-10-15 DIAGNOSIS — J9611 Chronic respiratory failure with hypoxia: Secondary | ICD-10-CM

## 2023-10-15 DIAGNOSIS — Z9981 Dependence on supplemental oxygen: Secondary | ICD-10-CM

## 2023-10-15 DIAGNOSIS — E782 Mixed hyperlipidemia: Secondary | ICD-10-CM

## 2023-10-15 DIAGNOSIS — J449 Chronic obstructive pulmonary disease, unspecified: Secondary | ICD-10-CM

## 2023-11-15 ENCOUNTER — Other Ambulatory Visit: Payer: Self-pay | Admitting: Family Medicine

## 2023-11-15 DIAGNOSIS — I2782 Chronic pulmonary embolism: Secondary | ICD-10-CM

## 2023-11-15 DIAGNOSIS — I119 Hypertensive heart disease without heart failure: Secondary | ICD-10-CM

## 2023-11-20 ENCOUNTER — Ambulatory Visit: Admitting: Cardiology

## 2023-12-13 ENCOUNTER — Other Ambulatory Visit: Payer: Self-pay | Admitting: Family Medicine

## 2023-12-13 DIAGNOSIS — J449 Chronic obstructive pulmonary disease, unspecified: Secondary | ICD-10-CM

## 2023-12-13 DIAGNOSIS — I119 Hypertensive heart disease without heart failure: Secondary | ICD-10-CM

## 2023-12-13 DIAGNOSIS — J9611 Chronic respiratory failure with hypoxia: Secondary | ICD-10-CM

## 2023-12-13 DIAGNOSIS — Z9981 Dependence on supplemental oxygen: Secondary | ICD-10-CM

## 2023-12-17 DIAGNOSIS — E785 Hyperlipidemia, unspecified: Secondary | ICD-10-CM | POA: Insufficient documentation

## 2023-12-20 ENCOUNTER — Encounter: Payer: Self-pay | Admitting: Cardiology

## 2023-12-20 ENCOUNTER — Ambulatory Visit: Attending: Cardiology | Admitting: Cardiology

## 2023-12-20 VITALS — BP 158/98 | HR 56 | Ht 63.0 in | Wt 235.6 lb

## 2023-12-20 DIAGNOSIS — N1832 Chronic kidney disease, stage 3b: Secondary | ICD-10-CM | POA: Diagnosis not present

## 2023-12-20 DIAGNOSIS — Z5181 Encounter for therapeutic drug level monitoring: Secondary | ICD-10-CM

## 2023-12-20 DIAGNOSIS — I2583 Coronary atherosclerosis due to lipid rich plaque: Secondary | ICD-10-CM

## 2023-12-20 DIAGNOSIS — I25118 Atherosclerotic heart disease of native coronary artery with other forms of angina pectoris: Secondary | ICD-10-CM

## 2023-12-20 DIAGNOSIS — I2510 Atherosclerotic heart disease of native coronary artery without angina pectoris: Secondary | ICD-10-CM

## 2023-12-20 DIAGNOSIS — I119 Hypertensive heart disease without heart failure: Secondary | ICD-10-CM | POA: Diagnosis not present

## 2023-12-20 DIAGNOSIS — I3139 Other pericardial effusion (noninflammatory): Secondary | ICD-10-CM

## 2023-12-20 MED ORDER — VALSARTAN 160 MG PO TABS: 160.0000 mg | ORAL_TABLET | Freq: Every day | ORAL | 3 refills | Status: AC

## 2023-12-20 NOTE — Patient Instructions (Addendum)
 Medication Instructions:  Your physician has recommended you make the following change in your medication:   Increase your Valsartan  to 160 mg daily  *If you need a refill on your cardiac medications before your next appointment, please call your pharmacy*   Lab Work: Your physician recommends that you return for lab work in: 1 week for fasting lipids and CMP You need to have labs done when you are fasting.  You can come Monday through Friday 8:30 am to 12:00 pm and 1:15 to 4:30. You do not need to make an appointment as the order has already been placed.   If you have labs (blood work) drawn today and your tests are completely normal, you will receive your results only by: MyChart Message (if you have MyChart) OR A paper copy in the mail If you have any lab test that is abnormal or we need to change your treatment, we will call you to review the results.   Testing/Procedures: None ordered   Follow-Up: At [facility], you and your health needs are our priority.  As part of our continuing mission to provide you with exceptional heart care, we have created designated Provider Care Teams.  These Care Teams include your primary Cardiologist (physician) and Advanced Practice Providers (APPs -  Physician Assistants and Nurse Practitioners) who all work together to provide you with the care you need, when you need it.  We recommend signing up for the patient portal called MyChart.  Sign up information is provided on this After Visit Summary.  MyChart is used to connect with patients for Virtual Visits (Telemedicine).  Patients are able to view lab/test results, encounter notes, upcoming appointments, etc.  Non-urgent messages can be sent to your provider as well.   To learn more about what you can do with MyChart, go to [email or web address].    Your next appointment:   9 month(s)  The format for your next appointment:   In Person  Provider:   Jennifer Crape, MD    Other  Instructions none  Important Information About Sugar

## 2023-12-20 NOTE — Progress Notes (Signed)
 Cardiology Office Note:    Date:  12/20/2023   ID:  Scott Foley, DOB Mar 18, 1965, MRN [id number]  PCP:  Sherre Clapper, MD  Cardiologist:  Jennifer JONELLE Crape, MD   Referring MD: Sherre Clapper, MD    ASSESSMENT:    1. Coronary artery disease of native artery of native heart with stable angina pectoris   2. Coronary artery disease due to lipid rich plaque   3. Hypertensive heart disease without congestive heart failure   4. Pericardial effusion   5. Stage 3b chronic kidney disease (CKD) (HCC)   6. Morbid obesity (HCC)   7. Medication monitoring encounter    PLAN:    In order of problems listed above:  Coronary artery disease: Secondary prevention stressed with the patient.  Importance of compliance with diet medication stressed and patient verbalized standing.  Patient was advised to ambulate and exercise to the best of his ability. Essential hypertension: Blood pressure stable and diet was emphasized.  Lifestyle modification urged.  Salt intake issues discussed.  He mentions to me that his blood pressure is elevated at home also so we will double valsartan .  He will be back in 1 week for pulse blood pressure check and a Chem-7.  He will keep a track of his blood pressures at home. Mixed dyslipidemia: On lipid-lowering medications.  His LDL is too low and we will recheck this today.  If it remains similarly we may try to hold off on injectable PCSK9 medications and review his lipids in the future. Obesity: Weight reduction stressed diet emphasized risks of obesity explained and he promises to do better. History of pulmonary embolism on anticoagulation.  History of stroke on clopidogrel .  These issues are followed by primary care. History of pericardial effusion: Echo reviewed from last evaluation.  No effusion noted.  I discussed this and reassured him. Patient will be seen in follow-up appointment in 6 months or earlier if the patient has any concerns.    Medication  Adjustments/Labs and Tests Ordered: Current medicines are reviewed at length with the patient today.  Concerns regarding medicines are outlined above.  Orders Placed This Encounter  Procedures   EKG 12-Lead   No orders of the defined types were placed in this encounter.    No chief complaint on file.    History of Present Illness:    Scott Foley is a 58 y.o. male.  Patient has past medical history of coronary artery disease, essential hypertension, mixed dyslipidemia, obesity and history of stroke.  For this reason he is on anticoagulation and Plavix .  This is followed by primary care.  No chest pain orthopnea or PND.  At the time of my evaluation, the patient is alert awake oriented and in no distress.  He leads a sedentary lifestyle.  Past Medical History:  Diagnosis Date   Acquired thrombophilia 12/06/2020   Allergic rhinitis due to allergen 06/02/2023   B12 deficiency 07/04/2021   Chronic obstructive pulmonary disease (HCC) 07/16/2018   Class 2 severe obesity due to excess calories with serious comorbidity and body mass index (BMI) of 37.0 to 37.9 in adult 12/06/2020   BMI 35 with serious comorbidities including CAD, HTN, STROKE, Hyperlipidemia.   Class 3 severe obesity due to excess calories with serious comorbidity and body mass index (BMI) of 40.0 to 44.9 in adult (HCC) 12/06/2020   BMI 35 with serious comorbidities including CAD, HTN, STROKE, Hyperlipidemia.     Colon polyp 10/25/2020   serrated adenoma.   Coronary  artery disease due to lipid rich plaque 01/01/2021   Coronary artery disease with stable angina pectoris    AMI No heart cath, treated medically [place] [facility]   Daytime somnolence 08/04/2022   Diverticulosis of colon 10/25/2020   Noted on colnoscopy by Dr Therisa in [place].   DNR (do not resuscitate) 03/03/2019   Gastroesophageal reflux disease without esophagitis 06/02/2023   History of blood clotting disorder 06/11/2021   History of CVA (cerebrovascular  accident) 07/16/2018   Formatting of this note might be different from the original. CVA 3 years ago left with short term memory deficits. Has been on Plavix  since.   History of pulmonary embolism 02/15/2021   Hyperlipemia    Hypertensive heart disease without congestive heart failure 12/06/2020   Hypoglycemia 04/15/2021   Hypokalemia 04/13/2021   Hypomagnesemia 06/11/2021   Impingement of left shoulder 04/05/2022   Intra-abdominal fluid collection 07/18/2021   Medication monitoring encounter 06/24/2021   Mixed hyperlipidemia 07/22/2018   Morbid obesity (HCC) 12/06/2020   BMI 35 with serious comorbidities including CAD, HTN, STROKE, Hyperlipidemia.   Normocytic anemia 04/15/2021   Obstructive sleep apnea 06/02/2023   Other fatigue 12/06/2020   Pericardial effusion 04/29/2021   Pulmonary nodules/lesions, multiple 11/25/2012   9/15/2014CT Chest : small nodule RUL LUL. Stable since 03/2012 Smoking cessation advised and Rx chantix  Repeat CT Chest 4 /21/15: subpleural lymph nodes, likely benign repeat one year 2016>>>no change, considered benign Spiro 11/25/2012  NORMAL   Sequela, post-stroke 07/28/2019   Stage 3b chronic kidney disease (CKD) (HCC) 11/27/2022   VRE (vancomycin-resistant Enterococci) infection 05/11/2021    Past Surgical History:  Procedure Laterality Date   COLONOSCOPY WITH PROPOFOL  N/A 10/25/2020   Therisa Bi, MD, at [facility]. 25 MM serrated adenoma polyp at ascending colon removed and site clipped.  Pan diverticulosis.   IR CATHETER TUBE CHANGE  09/22/2021   IR CATHETER TUBE CHANGE  10/10/2021   IR RADIOLOGIST EVAL & MGMT  05/25/2021   IR RADIOLOGIST EVAL & MGMT  08/09/2021   IR RADIOLOGIST EVAL & MGMT  08/23/2021   IR RADIOLOGIST EVAL & MGMT  09/21/2021   IR RADIOLOGIST EVAL & MGMT  10/07/2021   LAPAROSCOPIC CHOLECYSTECTOMY  02/2021   PERICARDIOCENTESIS N/A 05/01/2021   Procedure: PERICARDIOCENTESIS;  Surgeon: [place], Peter M, MD;  Location: [facility] INVASIVE CV LAB;  Service:  Cardiovascular;  Laterality: N/A;   TONSILLECTOMY      Current Medications: Current Meds  Medication Sig   albuterol  (VENTOLIN  HFA) 108 (90 Base) MCG/ACT inhaler INHALE 2 PUFFS BY MOUTH INTO LUNGS EVERY 6 HOURS AS NEEDED FOR SHORTNESS OF BREATH OR WHEEZING   clopidogrel  (PLAVIX ) 75 MG tablet TAKE ONE TABLET BY MOUTH EVERY MORNING   cyanocobalamin  (VITAMIN B12) 1000 MCG tablet TAKE ONE TABLET BY MOUTH EVERY MORNING   ELIQUIS  5 MG TABS tablet TAKE ONE TABLET BY MOUTH TWICE DAILY   icosapent  Ethyl (VASCEPA ) 1 g capsule Take 2 capsules (2 g total) by mouth 2 (two) times daily.   metoprolol  tartrate (LOPRESSOR ) 50 MG tablet TAKE ONE TABLET BY MOUTH TWICE DAILY   Multiple Vitamin (MULTIVITAMIN WITH MINERALS) TABS tablet Take 1 tablet by mouth daily.   Omega-3 Fatty Acids (FISH OIL ) 1000 MG CAPS Take 1 capsule (1,000 mg total) by mouth 2 (two) times daily.   ondansetron  (ZOFRAN ) 4 MG tablet Take 1 tablet (4 mg total) by mouth every 8 (eight) hours as needed for nausea or vomiting.   pantoprazole  (PROTONIX ) 40 MG tablet TAKE ONE TABLET BY MOUTH  EVERY MORNING AND TAKE ONE TABLET BY MOUTH EVERY EVENING   QC VITAMIN B12 1000 MCG TBCR TAKE ONE TABLET BY MOUTH EVERY MORNING   REPATHA  SURECLICK 140 MG/ML SOAJ INJECT 140MG  ( ) INTO THE SKIN EVERY 14 DAYS   rosuvastatin  (CRESTOR ) 40 MG tablet TAKE ONE TABLET BY MOUTH EVERY EVENING   TRELEGY ELLIPTA  100-62.5-25 MCG/ACT AEPB INHALE 1 PUFF BY MOUTH INTO LUNGS DAILY. RINSE MOUTH AFTER USE   valsartan  (DIOVAN ) 80 MG tablet TAKE ONE TABLET BY MOUTH EVERY DAY     Allergies:   Penicillin g   Social History   Socioeconomic History   Marital status: Married    Spouse name: Dawn   Number of children: 2   Years of education: Not on file   Highest education level: GED or equivalent  Occupational History   Not on file  Tobacco Use   Smoking status: Former    Current packs/day: 0.00    Average packs/day: 0.8 packs/day for 41.7 years (31.2 ttl pk-yrs)     Types: Cigarettes    Start date: 03/14/1979    Quit date: 11/2020    Years since quitting: 3.1   Smokeless tobacco: Never  Vaping Use   Vaping status: Former   Start date: 07/28/2018  Substance and Sexual Activity   Alcohol use: No   Drug use: No   Sexual activity: Yes    Partners: Female  Other Topics Concern   Not on file  Social History Narrative   Not on file   Social Drivers of Health   Financial Resource Strain: Low Risk  (10/04/2023)   Overall Financial Resource Strain (CARDIA)    Difficulty of Paying Living Expenses: Not hard at all  Food Insecurity: No Food Insecurity (10/04/2023)   Hunger Vital Sign    Worried About Running Out of Food in the Last Year: Never true    Ran Out of Food in the Last Year: Never true  Transportation Needs: No Transportation Needs (10/04/2023)   PRAPARE - Administrator, Civil Service (Medical): No    Lack of Transportation (Non-Medical): No  Physical Activity: Insufficiently Active (10/04/2023)   Exercise Vital Sign    Days of Exercise per Week: 4 days    Minutes of Exercise per Session: 30 min  Stress: No Stress Concern Present (10/04/2023)   [employer] of Occupational Health - Occupational Stress Questionnaire    Feeling of Stress: Not at all  Social Connections: Moderately Isolated (10/04/2023)   Social Connection and Isolation Panel    Frequency of Communication with Friends and Family: More than three times a week    Frequency of Social Gatherings with Friends and Family: Once a week    Attends Religious Services: Never    Database administrator or Organizations: No    Attends Engineer, structural: Never    Marital Status: Married     Family History: The patient's family history includes Brain cancer in his brother; Cancer in his brother; Cancer (age of onset: 71) in his sister; Cancer (age of onset: 89) in his sister; Cancer (age of onset: 72) in his brother; Cancer - Lung in his brother; Heart attack in  his brother and father; Heart disease in his brother; Hypertension in his brother, father, and sister; Lung cancer in his sister; Lung disease in his father; Prostate cancer in his father; Pulmonary embolism in his mother; Stroke in his father.  ROS:   Please see the history of present illness.  All other systems reviewed and are negative.  EKGs/Labs/Other Studies Reviewed:    The following studies were reviewed today: .SABRAEKG Interpretation Date/Time:  Thursday December 20 2023 08:05:22 EDT Ventricular Rate:  56 PR Interval:  144 QRS Duration:  84 QT Interval:  434 QTC Calculation: 418 R Axis:   -7  Text Interpretation: Sinus bradycardia Minimal voltage criteria for LVH, may be normal variant Cannot rule out Anterior infarct , age undetermined Abnormal ECG When compared with ECG of 08-Sep-2022 10:21, Minimal criteria for Anterior infarct are now Present Confirmed by Edwyna Backers [phone]) on 12/20/2023 8:21:47 AM     Recent Labs: 08/30/2023: ALT 22; BUN 17; Creatinine, Ser 1.32; Hemoglobin 16.7; Platelets 326; Potassium 4.4; Sodium 138  Recent Lipid Panel    Component Value Date/Time   CHOL 67 (L) 08/30/2023 0936   TRIG 125 08/30/2023 0936   HDL 27 (L) 08/30/2023 0936   CHOLHDL 2.5 08/30/2023 0936   LDLCALC 17 08/30/2023 0936    Physical Exam:    VS:  BP (!) 152/98   Pulse (!) 56   Ht 5' 3 (1.6 m)   Wt 235 lb 9.6 oz (106.9 kg)   SpO2 96%   BMI 41.73 kg/m     Wt Readings from Last 3 Encounters:  12/20/23 235 lb 9.6 oz (106.9 kg)  10/04/23 236 lb (107 kg)  08/30/23 236 lb (107 kg)     GEN: Patient is in no acute distress HEENT: Normal NECK: No JVD; No carotid bruits LYMPHATICS: No lymphadenopathy CARDIAC: Hear sounds regular, 2/6 systolic murmur at the apex. RESPIRATORY:  Clear to auscultation without rales, wheezing or rhonchi  ABDOMEN: Soft, non-tender, non-distended MUSCULOSKELETAL:  No edema; No deformity  SKIN: Warm and dry NEUROLOGIC:  Alert and oriented  x 3 PSYCHIATRIC:  Normal affect   Signed, Backers JONELLE Edwyna, MD  12/20/2023 8:22 AM    [facility]

## 2023-12-30 NOTE — Progress Notes (Signed)
 Subjective:  Patient ID: Scott Foley, male    DOB: Aug 09, 1965  Age: 58 y.o. MRN: [id number]  Chief Complaint  Patient presents with   Medical Management of Chronic Issues    HPI: Discussed the use of AI scribe software for clinical note transcription with the patient, who gave verbal consent to proceed.  History of Present Illness Scott Foley is a 58 year old male who presents for a routine follow-up visit.  Hypertension and hyperlipidemia - Takes valsartan  160 mg once daily and metoprolol  tartrate 50 mg twice daily for blood pressure control - Takes rosuvastatin  40 mg daily and Repatha  140 mg every two weeks for cholesterol management - No chest pain or breathing difficulties during exercise - Walks 30 minutes daily at a good pace to elevate heart rate  Anticoagulation and thromboembolic disease - Takes Eliquis  5 mg twice daily and Plavix  75 mg once daily due to history of multiple blood clots  Chronic obstructive pulmonary disease (copd) and respiratory status - Uses Trelegy one puff daily and albuterol  two puffs up to four times a day as needed - Breathing is good with no fevers, chills, sweats, earaches, sore throats, stuffy nose, or cough - Quit smoking 2-3 years ago - Lung cancer screening on September 10, 2023, showed a stable right lower lobe pulmonary nodule with no masses  Gastroesophageal reflux disease (gerd) - Uses pantoprazole  twice daily for acid reflux  Nutritional supplementation - Takes B12 1000 mcg daily and a multivitamin  Weight management and lifestyle - Working on weight management, reduced weight from 239 lbs to 234 lbs - Limiting fried and sugary foods  Musculoskeletal and genitourinary symptoms - No muscle pain, joint pain, or back pain - No issues with bowel or bladder function       10/04/2023    1:39 PM 08/30/2023    9:07 AM 06/29/2023    3:05 PM 03/30/2023    1:07 PM 09/27/2022    2:09 PM  Depression screen PHQ 2/9   Decreased Interest 0 0 0 0 0  Down, Depressed, Hopeless 0 0 0 0 0  PHQ - 2 Score 0 0 0 0 0  Altered sleeping 0 0   0  Tired, decreased energy 0 0   2  Change in appetite 0 0   0  Feeling bad or failure about yourself  0 0   0  Trouble concentrating 0 0   0  Moving slowly or fidgety/restless 0 0   0  Suicidal thoughts 0 0   0  PHQ-9 Score 0 0   2  Difficult doing work/chores Not difficult at all Not difficult at all   Not difficult at all        10/04/2023    1:40 PM  Fall Risk   Falls in the past year? 0  Number falls in past yr: 0  Injury with Fall? 0  Risk for fall due to : No Fall Risks  Follow up Falls prevention discussed;Education provided;Falls evaluation completed    Patient Care Team: Sherre Clapper, MD as PCP - General (Family Medicine) Croitoru, Jerel, MD as PCP - Cardiology (Cardiology) Tobie Gordy POUR, MD as Consulting Physician (Nephrology)   Review of Systems  All other systems reviewed and are negative.   Current Outpatient Medications on File Prior to Visit  Medication Sig Dispense Refill   albuterol  (VENTOLIN  HFA) 108 (90 Base) MCG/ACT inhaler INHALE 2 PUFFS BY MOUTH INTO LUNGS EVERY 6 HOURS AS NEEDED FOR  SHORTNESS OF BREATH OR WHEEZING 8.5 g 0   clopidogrel  (PLAVIX ) 75 MG tablet TAKE ONE TABLET BY MOUTH EVERY MORNING 180 tablet 0   cyanocobalamin  (VITAMIN B12) 1000 MCG tablet TAKE ONE TABLET BY MOUTH EVERY MORNING 90 tablet 3   ELIQUIS  5 MG TABS tablet TAKE ONE TABLET BY MOUTH TWICE DAILY 360 tablet 0   icosapent  Ethyl (VASCEPA ) 1 g capsule Take 2 capsules (2 g total) by mouth 2 (two) times daily. 120 capsule 2   metoprolol  tartrate (LOPRESSOR ) 50 MG tablet TAKE ONE TABLET BY MOUTH TWICE DAILY 240 tablet 0   Multiple Vitamin (MULTIVITAMIN WITH MINERALS) TABS tablet Take 1 tablet by mouth daily.     Omega-3 Fatty Acids (FISH OIL ) 1000 MG CAPS Take 1 capsule (1,000 mg total) by mouth 2 (two) times daily. 180 capsule 0   ondansetron  (ZOFRAN ) 4 MG tablet Take 1  tablet (4 mg total) by mouth every 8 (eight) hours as needed for nausea or vomiting. 20 tablet 0   pantoprazole  (PROTONIX ) 40 MG tablet TAKE ONE TABLET BY MOUTH EVERY MORNING AND TAKE ONE TABLET BY MOUTH EVERY EVENING 240 tablet 0   QC VITAMIN B12 1000 MCG TBCR TAKE ONE TABLET BY MOUTH EVERY MORNING 120 tablet 0   REPATHA  SURECLICK 140 MG/ML SOAJ INJECT 140MG  ( ) INTO THE SKIN EVERY 14 DAYS 2 mL 0   rosuvastatin  (CRESTOR ) 40 MG tablet TAKE ONE TABLET BY MOUTH EVERY EVENING 120 tablet 0   TRELEGY ELLIPTA  100-62.5-25 MCG/ACT AEPB INHALE 1 PUFF BY MOUTH INTO LUNGS DAILY. RINSE MOUTH AFTER USE 420 each 0   valsartan  (DIOVAN ) 160 MG tablet Take 1 tablet (160 mg total) by mouth daily. 90 tablet 3   No current facility-administered medications on file prior to visit.   Past Medical History:  Diagnosis Date   Acquired thrombophilia 12/06/2020   Allergic rhinitis due to allergen 06/02/2023   B12 deficiency 07/04/2021   Chronic obstructive pulmonary disease (HCC) 07/16/2018   Class 2 severe obesity due to excess calories with serious comorbidity and body mass index (BMI) of 37.0 to 37.9 in adult 12/06/2020   BMI 35 with serious comorbidities including CAD, HTN, STROKE, Hyperlipidemia.   Class 3 severe obesity due to excess calories with serious comorbidity and body mass index (BMI) of 40.0 to 44.9 in adult (HCC) 12/06/2020   BMI 35 with serious comorbidities including CAD, HTN, STROKE, Hyperlipidemia.     Colon polyp 10/25/2020   serrated adenoma.   Coronary artery disease due to lipid rich plaque 01/01/2021   Coronary artery disease with stable angina pectoris    AMI No heart cath, treated medically [facility]   Daytime somnolence 08/04/2022   Diverticulosis of colon 10/25/2020   Noted on colnoscopy by Dr Therisa in [place].   DNR (do not resuscitate) 03/03/2019   Gastroesophageal reflux disease without esophagitis 06/02/2023   History of blood clotting disorder 06/11/2021   History of CVA  (cerebrovascular accident) 07/16/2018   Formatting of this note might be different from the original. CVA 3 years ago left with short term memory deficits. Has been on Plavix  since.   History of pulmonary embolism 02/15/2021   Hyperlipemia    Hypertensive heart disease without congestive heart failure 12/06/2020   Hypoglycemia 04/15/2021   Hypokalemia 04/13/2021   Hypomagnesemia 06/11/2021   Impingement of left shoulder 04/05/2022   Intra-abdominal fluid collection 07/18/2021   Medication monitoring encounter 06/24/2021   Mixed hyperlipidemia 07/22/2018   Morbid obesity (HCC) 12/06/2020   BMI  35 with serious comorbidities including CAD, HTN, STROKE, Hyperlipidemia.   Normocytic anemia 04/15/2021   Obstructive sleep apnea 06/02/2023   Other fatigue 12/06/2020   Pericardial effusion 04/29/2021   Pulmonary nodules/lesions, multiple 11/25/2012   9/15/2014CT Chest : small nodule RUL LUL. Stable since 03/2012 Smoking cessation advised and Rx chantix  Repeat CT Chest 4 /21/15: subpleural lymph nodes, likely benign repeat one year 2016>>>no change, considered benign Spiro 11/25/2012  NORMAL   Sequela, post-stroke 07/28/2019   Stage 3b chronic kidney disease (CKD) (HCC) 11/27/2022   VRE (vancomycin-resistant Enterococci) infection 05/11/2021   Past Surgical History:  Procedure Laterality Date   COLONOSCOPY WITH PROPOFOL  N/A 10/25/2020   Therisa Bi, MD, at [facility]. 25 MM serrated adenoma polyp at ascending colon removed and site clipped.  Pan diverticulosis.   IR CATHETER TUBE CHANGE  09/22/2021   IR CATHETER TUBE CHANGE  10/10/2021   IR RADIOLOGIST EVAL & MGMT  05/25/2021   IR RADIOLOGIST EVAL & MGMT  08/09/2021   IR RADIOLOGIST EVAL & MGMT  08/23/2021   IR RADIOLOGIST EVAL & MGMT  09/21/2021   IR RADIOLOGIST EVAL & MGMT  10/07/2021   LAPAROSCOPIC CHOLECYSTECTOMY  02/2021   PERICARDIOCENTESIS N/A 05/01/2021   Procedure: PERICARDIOCENTESIS;  Surgeon: Jordan, Peter M, MD;  Location: [facility] INVASIVE CV  LAB;  Service: Cardiovascular;  Laterality: N/A;   TONSILLECTOMY      Family History  Problem Relation Age of Onset   Pulmonary embolism Mother    Hypertension Father    Heart attack Father    Stroke Father    Prostate cancer Father    Lung disease Father    Cancer Sister 74       lung cancer   Hypertension Sister    Cancer Sister 48       lung cancer   Lung cancer Sister    Cancer Brother        lung   Hypertension Brother    Heart disease Brother    Cancer Brother 62       throat and lung cancer   Heart attack Brother    Cancer - Lung Brother    Brain cancer Brother    Social History   Socioeconomic History   Marital status: Married    Spouse name: Dawn   Number of children: 2   Years of education: Not on file   Highest education level: GED or equivalent  Occupational History   Not on file  Tobacco Use   Smoking status: Former    Current packs/day: 0.00    Average packs/day: 0.8 packs/day for 41.7 years (31.2 ttl pk-yrs)    Types: Cigarettes    Start date: 03/14/1979    Quit date: 11/2020    Years since quitting: 3.1   Smokeless tobacco: Never  Vaping Use   Vaping status: Former   Start date: 07/28/2018  Substance and Sexual Activity   Alcohol use: No   Drug use: No   Sexual activity: Yes    Partners: Female  Other Topics Concern   Not on file  Social History Narrative   Not on file   Social Drivers of Health   Financial Resource Strain: Low Risk  (12/27/2023)   Overall Financial Resource Strain (CARDIA)    Difficulty of Paying Living Expenses: Not hard at all  Food Insecurity: No Food Insecurity (12/27/2023)   Hunger Vital Sign    Worried About Running Out of Food in the Last Year: Never true    Ran  Out of Food in the Last Year: Never true  Transportation Needs: No Transportation Needs (12/27/2023)   PRAPARE - Administrator, Civil Service (Medical): No    Lack of Transportation (Non-Medical): No  Physical Activity: Sufficiently  Active (12/27/2023)   Exercise Vital Sign    Days of Exercise per Week: 5 days    Minutes of Exercise per Session: 30 min  Recent Concern: Physical Activity - Insufficiently Active (10/04/2023)   Exercise Vital Sign    Days of Exercise per Week: 4 days    Minutes of Exercise per Session: 30 min  Stress: No Stress Concern Present (12/27/2023)   [employer] of Occupational Health - Occupational Stress Questionnaire    Feeling of Stress: Not at all  Social Connections: Moderately Isolated (12/27/2023)   Social Connection and Isolation Panel    Frequency of Communication with Friends and Family: More than three times a week    Frequency of Social Gatherings with Friends and Family: More than three times a week    Attends Religious Services: Never    Database Administrator or Organizations: No    Attends Engineer, Structural: Not on file    Marital Status: Married    Objective:  BP 116/68   Pulse (!) 56   Temp 98.2 F (36.8 C)   Ht 5' 3 (1.6 m)   Wt 234 lb (106.1 kg)   SpO2 96%   BMI 41.45 kg/m      12/31/2023    9:14 AM 12/20/2023    8:36 AM 12/20/2023    8:04 AM  BP/Weight  Systolic BP 116 158 152  Diastolic BP 68 98 98  Wt. (Lbs) 234  235.6  BMI 41.45 kg/m2  41.73 kg/m2    Physical Exam Vitals reviewed.  Constitutional:      Appearance: Normal appearance. He is obese.  Neck:     Vascular: No carotid bruit.  Cardiovascular:     Rate and Rhythm: Normal rate and regular rhythm.     Pulses: Normal pulses.     Heart sounds: Normal heart sounds.  Pulmonary:     Effort: Pulmonary effort is normal.     Breath sounds: Normal breath sounds. No wheezing, rhonchi or rales.  Abdominal:     General: Bowel sounds are normal.     Palpations: Abdomen is soft.     Tenderness: There is no abdominal tenderness.  Neurological:     Mental Status: He is alert and oriented to person, place, and time.  Psychiatric:        Mood and Affect: Mood normal.         Behavior: Behavior normal.         Lab Results  Component Value Date   WBC 9.3 08/30/2023   HGB 16.7 08/30/2023   HCT 51.3 (H) 08/30/2023   PLT 326 08/30/2023   GLUCOSE 94 12/31/2023   CHOL 67 (L) 08/30/2023   TRIG 125 08/30/2023   HDL 27 (L) 08/30/2023   LDLCALC 17 08/30/2023   ALT 20 12/31/2023   AST 15 12/31/2023   NA 137 12/31/2023   K 5.0 12/31/2023   CL 102 12/31/2023   CREATININE 1.41 (H) 12/31/2023   BUN 15 12/31/2023   CO2 22 12/31/2023   TSH 3.170 04/05/2022   INR 1.2 07/21/2021    Results for orders placed or performed in visit on 12/31/23  POCT Lipid Panel   Collection Time: 12/31/23  9:31 AM  Result Value  Ref Range   TC <100    HDL 26    TRG 129    LDL n/a    Non-HDL n/a    TC/HDL    Comprehensive metabolic panel with GFR   Collection Time: 12/31/23 10:27 AM  Result Value Ref Range   Glucose 94 70 - 99 mg/dL   BUN 15 6 - 24 mg/dL   Creatinine, Ser 8.58 (H) 0.76 - 1.27 mg/dL   eGFR 57 (L) >40 fO/fpw/8.26   BUN/Creatinine Ratio 11 9 - 20   Sodium 137 134 - 144 mmol/L   Potassium 5.0 3.5 - 5.2 mmol/L   Chloride 102 96 - 106 mmol/L   CO2 22 20 - 29 mmol/L   Calcium  9.7 8.7 - 10.2 mg/dL   Total Protein 7.0 6.0 - 8.5 g/dL   Albumin 4.3 3.8 - 4.9 g/dL   Globulin, Total 2.7 1.5 - 4.5 g/dL   Bilirubin Total 0.5 0.0 - 1.2 mg/dL   Alkaline Phosphatase 96 47 - 123 IU/L   AST 15 0 - 40 IU/L   ALT 20 0 - 44 IU/L  .  Assessment & Plan:   Assessment & Plan Hypertensive heart disease without congestive heart failure Hypertension well-controlled with current medication. - Continue valsartan  160 mg once daily. - Continue metoprolol  tartrate 50 mg twice daily.    Simple chronic bronchitis (HCC) COPD well-managed, no symptoms reported. - Continue Trelegy one puff daily. - Continue albuterol  two puffs up to four times a day as needed.    Mixed hyperlipidemia Cholesterol levels well-controlled, low HDL typical for him. Lifestyle modifications  recommended. - Continue rosuvastatin  40 mg daily. - Continue Repatha  140 mg every two weeks. - Encourage healthy eating and regular exercise. Orders:   POCT Lipid Panel   Comprehensive metabolic panel with GFR  Gastroesophageal reflux disease without esophagitis GERD managed with pantoprazole , no symptoms reported. - Continue pantoprazole  twice daily.    History of venous thromboembolism Multiple venous thromboembolisms managed with anticoagulation therapy. - Continue Eliquis  5 mg twice daily. - Continue Plavix  75 mg once daily.    CKD stage 3b, GFR 30-44 ml/min (HCC) Previous abnormal kidney function under evaluation. - Order blood test to evaluate kidney function.    Class 3 severe obesity due to excess calories with serious comorbidity and body mass index (BMI) of 40.0 to 44.9 in adult (HCC) Weight fluctuating, recent weight 234 lbs. Encouraged lifestyle changes for weight loss. - Encourage walking 30 minutes daily. - Encourage limiting fried and sugary foods.    Body mass index is 41.45 kg/m.   No orders of the defined types were placed in this encounter.   Orders Placed This Encounter  Procedures   Comprehensive metabolic panel with GFR   POCT Lipid Panel     I,Marla I Leal-Borjas,acting as a scribe for Abigail Free, MD.,have documented all relevant documentation on the behalf of Abigail Free, MD,as directed by  Abigail Free, MD while in the presence of Abigail Free, MD.    Follow-up: Return in about 4 months (around 05/02/2024) for chronic follow up.  An After Visit Summary was printed and given to the patient.  Abigail Free, MD [facility] [phone]

## 2023-12-30 NOTE — Assessment & Plan Note (Signed)
 SABRA

## 2023-12-30 NOTE — Assessment & Plan Note (Addendum)
  Orders:   POCT Lipid Panel   Comprehensive metabolic panel with GFR

## 2023-12-31 ENCOUNTER — Ambulatory Visit: Admitting: Family Medicine

## 2023-12-31 VITALS — BP 116/68 | HR 56 | Temp 98.2°F | Ht 63.0 in | Wt 234.0 lb

## 2023-12-31 DIAGNOSIS — E782 Mixed hyperlipidemia: Secondary | ICD-10-CM

## 2023-12-31 DIAGNOSIS — K219 Gastro-esophageal reflux disease without esophagitis: Secondary | ICD-10-CM | POA: Diagnosis not present

## 2023-12-31 DIAGNOSIS — N1832 Chronic kidney disease, stage 3b: Secondary | ICD-10-CM | POA: Diagnosis not present

## 2023-12-31 DIAGNOSIS — E66813 Obesity, class 3: Secondary | ICD-10-CM

## 2023-12-31 DIAGNOSIS — J410 Simple chronic bronchitis: Secondary | ICD-10-CM | POA: Diagnosis not present

## 2023-12-31 DIAGNOSIS — Z6841 Body Mass Index (BMI) 40.0 and over, adult: Secondary | ICD-10-CM | POA: Diagnosis not present

## 2023-12-31 DIAGNOSIS — Z86718 Personal history of other venous thrombosis and embolism: Secondary | ICD-10-CM

## 2023-12-31 DIAGNOSIS — I8290 Acute embolism and thrombosis of unspecified vein: Secondary | ICD-10-CM

## 2023-12-31 DIAGNOSIS — I119 Hypertensive heart disease without heart failure: Secondary | ICD-10-CM | POA: Diagnosis not present

## 2023-12-31 DIAGNOSIS — N289 Disorder of kidney and ureter, unspecified: Secondary | ICD-10-CM

## 2023-12-31 LAB — POCT LIPID PANEL
HDL: 26
TC: <100
TRG: 129

## 2023-12-31 LAB — COMPREHENSIVE METABOLIC PANEL WITH GFR
ALT: 20 IU/L (ref 0–44)
AST: 15 IU/L (ref 0–40)
Albumin: 4.3 g/dL (ref 3.8–4.9)
Alkaline Phosphatase: 96 IU/L (ref 47–123)
BUN/Creatinine Ratio: 11 (ref 9–20)
BUN: 15 mg/dL (ref 6–24)
Bilirubin Total: 0.5 mg/dL (ref 0.0–1.2)
CO2: 22 mmol/L (ref 20–29)
Calcium: 9.7 mg/dL (ref 8.7–10.2)
Chloride: 102 mmol/L (ref 96–106)
Creatinine, Ser: 1.41 mg/dL — ABNORMAL HIGH (ref 0.76–1.27)
Globulin, Total: 2.7 g/dL (ref 1.5–4.5)
Glucose: 94 mg/dL (ref 70–99)
Potassium: 5 mmol/L (ref 3.5–5.2)
Sodium: 137 mmol/L (ref 134–144)
Total Protein: 7 g/dL (ref 6.0–8.5)
eGFR: 57 mL/min/1.73 — ABNORMAL LOW (ref >59)

## 2023-12-31 NOTE — Patient Instructions (Addendum)
  VISIT SUMMARY: Today, you had a routine follow-up visit to review your ongoing health conditions and medications. Your blood pressure, cholesterol, and COPD are well-managed with your current medications. You are also making good progress with your weight management efforts.  YOUR PLAN: CHRONIC OBSTRUCTIVE PULMONARY DISEASE (COPD): Your COPD is well-managed, and you have no symptoms. -Continue using Trelegy one puff daily. -Continue using albuterol  two puffs up to four times a day as needed.  VENOUS THROMBOEMBOLISM: Your history of blood clots is managed with anticoagulation therapy. -Continue taking Eliquis  5 mg twice daily.  HYPERTENSION/HEART DISEASE: Your blood pressure is well-controlled with your current medication. -Continue taking valsartan  160 mg once daily. -Continue taking metoprolol  tartrate 50 mg twice daily. -Continue taking Plavix  75 mg once daily.  MIXED HYPERLIPIDEMIA: Your cholesterol levels are well-controlled, though your HDL is typically low. -Continue taking rosuvastatin  40 mg daily. -Continue taking Repatha  140 mg every two weeks. -Continue healthy eating and regular exercise.  OBESITY, CLASS 3:  -Continue walking 30 minutes daily. -Continue limiting fried and sugary foods.  GASTROESOPHAGEAL REFLUX DISEASE (GERD): Your GERD is managed with pantoprazole , and you have no symptoms. -Continue taking pantoprazole  twice daily.  ABNORMAL KIDNEY FUNCTION: Your previous abnormal kidney function is under evaluation. -A blood test will be ordered to evaluate your kidney function.                      Contains text generated by Abridge.                                 Contains text generated by Abridge.

## 2024-01-01 ENCOUNTER — Ambulatory Visit: Payer: Self-pay | Admitting: Family Medicine

## 2024-01-02 DIAGNOSIS — N289 Disorder of kidney and ureter, unspecified: Secondary | ICD-10-CM | POA: Insufficient documentation

## 2024-01-02 DIAGNOSIS — I8290 Acute embolism and thrombosis of unspecified vein: Secondary | ICD-10-CM | POA: Insufficient documentation

## 2024-01-02 NOTE — Assessment & Plan Note (Addendum)
 Previous abnormal kidney function under evaluation. - Order blood test to evaluate kidney function.

## 2024-01-02 NOTE — Assessment & Plan Note (Addendum)
 Multiple venous thromboembolisms managed with anticoagulation therapy. - Continue Eliquis  5 mg twice daily. - Continue Plavix  75 mg once daily.

## 2024-01-02 NOTE — Assessment & Plan Note (Addendum)
 GERD managed with pantoprazole , no symptoms reported. - Continue pantoprazole  twice daily.

## 2024-01-06 ENCOUNTER — Encounter: Payer: Self-pay | Admitting: Family Medicine

## 2024-01-15 ENCOUNTER — Other Ambulatory Visit: Payer: Self-pay

## 2024-01-15 ENCOUNTER — Other Ambulatory Visit: Payer: Self-pay | Admitting: Family Medicine

## 2024-01-15 DIAGNOSIS — I119 Hypertensive heart disease without heart failure: Secondary | ICD-10-CM

## 2024-01-15 MED ORDER — REPATHA SURECLICK 140 MG/ML ~~LOC~~ SOAJ: 140.0000 mg | SUBCUTANEOUS | 3 refills | Status: DC

## 2024-01-16 ENCOUNTER — Other Ambulatory Visit: Payer: Self-pay | Admitting: Family Medicine

## 2024-01-16 DIAGNOSIS — I119 Hypertensive heart disease without heart failure: Secondary | ICD-10-CM

## 2024-01-17 ENCOUNTER — Other Ambulatory Visit: Payer: Self-pay

## 2024-01-17 ENCOUNTER — Telehealth: Payer: Self-pay | Admitting: Family Medicine

## 2024-01-17 MED ORDER — REPATHA SURECLICK 140 MG/ML ~~LOC~~ SOAJ: 140.0000 mg | SUBCUTANEOUS | 2 refills | Status: AC

## 2024-01-17 NOTE — Telephone Encounter (Signed)
 Copied from CRM #[id number]. Topic: Clinical - Prescription Issue >> Jan 17, 2024 11:43 AM Alfonso ORN wrote: Reason for CRM: pt calling to f/u on rx sent to another location. Confirmed rx [facility]  stated [facility] [address] [place], [place] - [phone] for location . Pt said they saw in system it was sent somewhere else. Please fax or f/u on rx  Callback:[phone] (H)

## 2024-03-17 ENCOUNTER — Other Ambulatory Visit: Payer: Self-pay | Admitting: Family Medicine

## 2024-03-17 DIAGNOSIS — E782 Mixed hyperlipidemia: Secondary | ICD-10-CM

## 2024-04-15 ENCOUNTER — Other Ambulatory Visit: Payer: Self-pay | Admitting: Family Medicine

## 2024-05-02 ENCOUNTER — Ambulatory Visit: Admitting: Family Medicine

## 2024-05-07 ENCOUNTER — Ambulatory Visit: Admitting: Family Medicine

## 2024-10-09 ENCOUNTER — Ambulatory Visit

## 2024-10-15 ENCOUNTER — Ambulatory Visit
